# Patient Record
Sex: Female | Born: 1960
Health system: Southern US, Community
[De-identification: ages and names within clinical notes are randomized; demographics above are authoritative.]

## PROBLEM LIST (undated history)

## (undated) DIAGNOSIS — Z720 Tobacco use: Secondary | ICD-10-CM

## (undated) DIAGNOSIS — Z87440 Personal history of urinary (tract) infections: Secondary | ICD-10-CM

## (undated) DIAGNOSIS — J45909 Unspecified asthma, uncomplicated: Secondary | ICD-10-CM

## (undated) DIAGNOSIS — F319 Bipolar disorder, unspecified: Secondary | ICD-10-CM

## (undated) DIAGNOSIS — R892 Abnormal level of other drugs, medicaments and biological substances in specimens from other organs, systems and tissues: Secondary | ICD-10-CM

## (undated) DIAGNOSIS — R079 Chest pain, unspecified: Secondary | ICD-10-CM

## (undated) DIAGNOSIS — T7491XA Unspecified adult maltreatment, confirmed, initial encounter: Secondary | ICD-10-CM

## (undated) DIAGNOSIS — F1010 Alcohol abuse, uncomplicated: Secondary | ICD-10-CM

## (undated) DIAGNOSIS — J449 Chronic obstructive pulmonary disease, unspecified: Secondary | ICD-10-CM

## (undated) DIAGNOSIS — J329 Chronic sinusitis, unspecified: Secondary | ICD-10-CM

## (undated) DIAGNOSIS — F1910 Other psychoactive substance abuse, uncomplicated: Secondary | ICD-10-CM

## (undated) DIAGNOSIS — F329 Major depressive disorder, single episode, unspecified: Secondary | ICD-10-CM

## (undated) DIAGNOSIS — M0600 Rheumatoid arthritis without rheumatoid factor, unspecified site: Secondary | ICD-10-CM

## (undated) DIAGNOSIS — F411 Generalized anxiety disorder: Secondary | ICD-10-CM

## (undated) DIAGNOSIS — M25559 Pain in unspecified hip: Secondary | ICD-10-CM

## (undated) DIAGNOSIS — R609 Edema, unspecified: Secondary | ICD-10-CM

## (undated) DIAGNOSIS — M6282 Rhabdomyolysis: Secondary | ICD-10-CM

## (undated) DIAGNOSIS — F32A Depression, unspecified: Secondary | ICD-10-CM

## (undated) DIAGNOSIS — H6690 Otitis media, unspecified, unspecified ear: Secondary | ICD-10-CM

## (undated) DIAGNOSIS — F1410 Cocaine abuse, uncomplicated: Secondary | ICD-10-CM

## (undated) DIAGNOSIS — F1510 Other stimulant abuse, uncomplicated: Secondary | ICD-10-CM

## (undated) DIAGNOSIS — J309 Allergic rhinitis, unspecified: Secondary | ICD-10-CM

## (undated) DIAGNOSIS — F17200 Nicotine dependence, unspecified, uncomplicated: Secondary | ICD-10-CM

## (undated) DIAGNOSIS — F419 Anxiety disorder, unspecified: Secondary | ICD-10-CM

## (undated) DIAGNOSIS — Z9289 Personal history of other medical treatment: Secondary | ICD-10-CM

## (undated) DIAGNOSIS — K219 Gastro-esophageal reflux disease without esophagitis: Secondary | ICD-10-CM

## (undated) DIAGNOSIS — K589 Irritable bowel syndrome without diarrhea: Secondary | ICD-10-CM

## (undated) DIAGNOSIS — I480 Paroxysmal atrial fibrillation: Secondary | ICD-10-CM

## (undated) DIAGNOSIS — F22 Delusional disorders: Secondary | ICD-10-CM

## (undated) DIAGNOSIS — E785 Hyperlipidemia, unspecified: Secondary | ICD-10-CM

## (undated) DIAGNOSIS — F341 Dysthymic disorder: Secondary | ICD-10-CM

## (undated) DIAGNOSIS — F29 Unspecified psychosis not due to a substance or known physiological condition: Secondary | ICD-10-CM

## (undated) DIAGNOSIS — M359 Systemic involvement of connective tissue, unspecified: Secondary | ICD-10-CM

## (undated) DIAGNOSIS — J40 Bronchitis, not specified as acute or chronic: Secondary | ICD-10-CM

## (undated) DIAGNOSIS — M797 Fibromyalgia: Secondary | ICD-10-CM

## (undated) DIAGNOSIS — N390 Urinary tract infection, site not specified: Secondary | ICD-10-CM

## (undated) HISTORY — DX: Allergic rhinitis, unspecified: J30.9

## (undated) HISTORY — DX: Chronic obstructive pulmonary disease, unspecified: J44.9

## (undated) HISTORY — DX: Gastro-esophageal reflux disease without esophagitis: K21.9

## (undated) HISTORY — DX: Major depressive disorder, single episode, unspecified: F32.9

## (undated) HISTORY — PX: TUBAL LIGATION: SHX77

## (undated) HISTORY — DX: Depression, unspecified: F32.A

## (undated) HISTORY — DX: Bronchitis, not specified as acute or chronic: J40

## (undated) HISTORY — DX: Otitis media, unspecified, unspecified ear: H66.90

## (undated) HISTORY — DX: Urinary tract infection, site not specified: N39.0

## (undated) HISTORY — PX: TONSILLECTOMY: SHX5217

## (undated) HISTORY — DX: Nicotine dependence, unspecified, uncomplicated: F17.200

## (undated) HISTORY — DX: Unspecified asthma, uncomplicated: J45.909

## (undated) HISTORY — DX: Dysthymic disorder: F34.1

## (undated) HISTORY — DX: Chronic sinusitis, unspecified: J32.9

## (undated) HISTORY — DX: Rheumatoid arthritis without rheumatoid factor, unspecified site: M06.00

## (undated) HISTORY — PX: MOUTH SURGERY: SHX715

## (undated) HISTORY — DX: Unspecified adult maltreatment, confirmed, initial encounter: T74.91XA

## (undated) HISTORY — PX: FOOT SURGERY: SHX648

## (undated) HISTORY — DX: Irritable bowel syndrome without diarrhea: K58.9

## (undated) HISTORY — DX: Edema, unspecified: R60.9

## (undated) HISTORY — DX: Rhabdomyolysis: M62.82

## (undated) HISTORY — DX: Personal history of urinary (tract) infections: Z87.440

## (undated) HISTORY — DX: Hyperlipidemia, unspecified: E78.5

## (undated) HISTORY — DX: Pain in unspecified hip: M25.559

## (undated) HISTORY — DX: Abnormal level of other drugs, medicaments and biological substances in specimens from other organs, systems and tissues: R89.2

---
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
## 1998-07-13 HISTORY — PX: ABDOMINAL HYSTERECTOMY: SHX81

## 2003-11-28 LAB — HM MAMMOGRAPHY: HM Mammogram: NORMAL

## 2004-07-30 ENCOUNTER — Inpatient Hospital Stay: Payer: Self-pay

## 2004-08-20 ENCOUNTER — Ambulatory Visit: Payer: Self-pay | Admitting: Internal Medicine

## 2004-11-12 ENCOUNTER — Ambulatory Visit: Payer: Self-pay | Admitting: Internal Medicine

## 2005-06-17 ENCOUNTER — Ambulatory Visit: Payer: Self-pay | Admitting: Internal Medicine

## 2005-08-01 ENCOUNTER — Emergency Department: Payer: Self-pay | Admitting: Unknown Physician Specialty

## 2005-08-03 ENCOUNTER — Emergency Department: Payer: Self-pay | Admitting: Emergency Medicine

## 2005-11-04 ENCOUNTER — Ambulatory Visit: Payer: Self-pay | Admitting: Internal Medicine

## 2006-08-27 ENCOUNTER — Emergency Department: Payer: Self-pay | Admitting: Unknown Physician Specialty

## 2006-08-30 ENCOUNTER — Emergency Department: Payer: Self-pay | Admitting: Unknown Physician Specialty

## 2006-11-14 ENCOUNTER — Emergency Department: Payer: Self-pay | Admitting: Emergency Medicine

## 2008-03-26 ENCOUNTER — Ambulatory Visit: Payer: Self-pay | Admitting: Internal Medicine

## 2008-03-26 DIAGNOSIS — M0600 Rheumatoid arthritis without rheumatoid factor, unspecified site: Secondary | ICD-10-CM

## 2008-03-26 DIAGNOSIS — K581 Irritable bowel syndrome with constipation: Secondary | ICD-10-CM

## 2008-03-26 DIAGNOSIS — R609 Edema, unspecified: Secondary | ICD-10-CM

## 2008-03-26 DIAGNOSIS — J329 Chronic sinusitis, unspecified: Secondary | ICD-10-CM

## 2008-03-26 DIAGNOSIS — H6690 Otitis media, unspecified, unspecified ear: Secondary | ICD-10-CM

## 2008-03-26 DIAGNOSIS — F341 Dysthymic disorder: Secondary | ICD-10-CM

## 2008-03-26 DIAGNOSIS — N3020 Other chronic cystitis without hematuria: Secondary | ICD-10-CM

## 2008-03-26 DIAGNOSIS — N390 Urinary tract infection, site not specified: Secondary | ICD-10-CM

## 2008-03-26 DIAGNOSIS — H6692 Otitis media, unspecified, left ear: Secondary | ICD-10-CM

## 2008-03-26 DIAGNOSIS — J40 Bronchitis, not specified as acute or chronic: Secondary | ICD-10-CM

## 2008-03-26 DIAGNOSIS — F411 Generalized anxiety disorder: Secondary | ICD-10-CM

## 2008-03-26 DIAGNOSIS — K589 Irritable bowel syndrome without diarrhea: Secondary | ICD-10-CM

## 2008-03-26 HISTORY — DX: Irritable bowel syndrome, unspecified: K58.9

## 2008-03-26 HISTORY — DX: Urinary tract infection, site not specified: N39.0

## 2008-03-26 HISTORY — DX: Chronic sinusitis, unspecified: J32.9

## 2008-03-26 HISTORY — DX: Otitis media, unspecified, unspecified ear: H66.90

## 2008-03-26 HISTORY — DX: Dysthymic disorder: F34.1

## 2008-03-26 HISTORY — DX: Edema, unspecified: R60.9

## 2008-03-26 HISTORY — DX: Rheumatoid arthritis without rheumatoid factor, unspecified site: M06.00

## 2008-03-28 ENCOUNTER — Telehealth: Payer: Self-pay | Admitting: Internal Medicine

## 2008-05-14 ENCOUNTER — Emergency Department: Payer: Self-pay | Admitting: Unknown Physician Specialty

## 2008-06-22 ENCOUNTER — Ambulatory Visit: Payer: Self-pay | Admitting: Internal Medicine

## 2008-06-22 LAB — CONVERTED CEMR LAB: Rhuematoid fact SerPl-aCnc: <20 intl units/mL — ABNORMAL LOW (ref 0.0–20.0)

## 2008-06-29 ENCOUNTER — Emergency Department: Payer: Self-pay | Admitting: Emergency Medicine

## 2008-07-17 ENCOUNTER — Telehealth: Payer: Self-pay | Admitting: Internal Medicine

## 2008-08-22 ENCOUNTER — Telehealth: Payer: Self-pay | Admitting: Internal Medicine

## 2008-09-05 ENCOUNTER — Emergency Department: Payer: Self-pay | Admitting: Emergency Medicine

## 2008-09-21 ENCOUNTER — Ambulatory Visit: Payer: Self-pay | Admitting: Internal Medicine

## 2008-09-21 DIAGNOSIS — M25559 Pain in unspecified hip: Secondary | ICD-10-CM

## 2008-09-21 HISTORY — DX: Pain in unspecified hip: M25.559

## 2008-09-25 ENCOUNTER — Telehealth (INDEPENDENT_AMBULATORY_CARE_PROVIDER_SITE_OTHER): Payer: Self-pay | Admitting: *Deleted

## 2008-10-08 ENCOUNTER — Telehealth: Payer: Self-pay | Admitting: Internal Medicine

## 2008-12-20 ENCOUNTER — Ambulatory Visit: Payer: Self-pay | Admitting: Internal Medicine

## 2009-02-27 ENCOUNTER — Telehealth: Payer: Self-pay | Admitting: Internal Medicine

## 2009-03-01 ENCOUNTER — Telehealth: Payer: Self-pay | Admitting: Internal Medicine

## 2009-03-05 ENCOUNTER — Encounter (INDEPENDENT_AMBULATORY_CARE_PROVIDER_SITE_OTHER): Payer: Self-pay | Admitting: *Deleted

## 2009-03-22 ENCOUNTER — Ambulatory Visit: Payer: Self-pay | Admitting: Internal Medicine

## 2009-03-23 DIAGNOSIS — J309 Allergic rhinitis, unspecified: Secondary | ICD-10-CM | POA: Insufficient documentation

## 2009-03-23 HISTORY — DX: Allergic rhinitis, unspecified: J30.9

## 2009-04-04 ENCOUNTER — Encounter: Payer: Self-pay | Admitting: Internal Medicine

## 2009-05-21 ENCOUNTER — Telehealth (INDEPENDENT_AMBULATORY_CARE_PROVIDER_SITE_OTHER): Payer: Self-pay | Admitting: *Deleted

## 2009-06-02 ENCOUNTER — Emergency Department: Payer: Self-pay | Admitting: Emergency Medicine

## 2009-06-04 ENCOUNTER — Emergency Department: Payer: Self-pay | Admitting: Emergency Medicine

## 2009-06-24 ENCOUNTER — Ambulatory Visit: Payer: Self-pay | Admitting: Internal Medicine

## 2009-06-24 DIAGNOSIS — Z87891 Personal history of nicotine dependence: Secondary | ICD-10-CM

## 2009-06-24 DIAGNOSIS — F17200 Nicotine dependence, unspecified, uncomplicated: Secondary | ICD-10-CM

## 2009-06-24 HISTORY — DX: Nicotine dependence, unspecified, uncomplicated: F17.200

## 2009-08-16 ENCOUNTER — Encounter: Payer: Self-pay | Admitting: Internal Medicine

## 2009-08-26 ENCOUNTER — Telehealth: Payer: Self-pay | Admitting: Internal Medicine

## 2009-09-27 ENCOUNTER — Ambulatory Visit: Payer: Self-pay

## 2009-09-27 ENCOUNTER — Ambulatory Visit: Payer: Self-pay | Admitting: Internal Medicine

## 2009-10-08 ENCOUNTER — Telehealth: Payer: Self-pay | Admitting: Internal Medicine

## 2009-10-16 ENCOUNTER — Telehealth (INDEPENDENT_AMBULATORY_CARE_PROVIDER_SITE_OTHER): Payer: Self-pay | Admitting: *Deleted

## 2009-10-28 ENCOUNTER — Telehealth: Payer: Self-pay | Admitting: Internal Medicine

## 2009-12-23 ENCOUNTER — Ambulatory Visit: Payer: Self-pay | Admitting: Internal Medicine

## 2010-01-09 ENCOUNTER — Telehealth: Payer: Self-pay | Admitting: Internal Medicine

## 2010-02-25 ENCOUNTER — Telehealth: Payer: Self-pay | Admitting: Internal Medicine

## 2010-03-06 ENCOUNTER — Telehealth: Payer: Self-pay | Admitting: Internal Medicine

## 2010-03-28 ENCOUNTER — Telehealth: Payer: Self-pay | Admitting: Internal Medicine

## 2010-04-22 ENCOUNTER — Ambulatory Visit: Payer: Self-pay | Admitting: Internal Medicine

## 2010-04-28 ENCOUNTER — Telehealth: Payer: Self-pay | Admitting: Internal Medicine

## 2010-05-12 ENCOUNTER — Emergency Department: Payer: Self-pay | Admitting: Unknown Physician Specialty

## 2010-07-23 ENCOUNTER — Ambulatory Visit: Admit: 2010-07-23 | Payer: Self-pay | Admitting: Internal Medicine
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
## 2010-08-13 NOTE — Progress Notes (Signed)
Summary: REQ FOR RF  Phone Note Other Incoming Call back at [facility] Phone [phone]   Caller: pt Details of Request: Pharm Phone  [phone] Fax [phone] Summary of Call: Pt called and states that she needs a refill on Xifaxan for her diarrhea and a refill on prednisone for her back. She uses [facility] in [place] fear. Please Advise Initial call taken by: Ami Bullins CMA,  January 09, 2010 11:31 AM  Follow-up for Phone Call        1. OK for xifaxan 200mg  three times a day x 7 days for IBS flare 2.OOK for prednisone 10mg : 3 tabs once daily x 3, 2 tabs once daily x 3, 1 tab once daily x 6 Follow-up by: Jacques Navy MD,  January 09, 2010 5:26 PM  Additional Follow-up for Phone Call Additional follow up Details #1::        Called into pharmacy, Pt informed  Additional Follow-up by: Lamar Sprinkles, CMA,  January 10, 2010 3:07 PM    New/Updated Medications: XIFAXAN 200 MG TABS (RIFAXIMIN) 1 three times a day x 7 days for IBS flare PREDNISONE 10 MG TABS (PREDNISONE) 3 tabs once daily x 3, 2 tabs once daily x 3, 1 tab once daily x 6 Prescriptions: PREDNISONE 10 MG TABS (PREDNISONE) 3 tabs once daily x 3, 2 tabs once daily x 3, 1 tab once daily x 6  #21 x 0   Entered by:   Lamar Sprinkles, CMA   Authorized by:   Jacques Navy MD   Signed by:   Lamar Sprinkles, CMA on 01/10/2010   Method used:   Historical   RxID:   [id number] XIFAXAN 200 MG TABS (RIFAXIMIN) 1 three times a day x 7 days for IBS flare  #21 x 0   Entered by:   Lamar Sprinkles, CMA   Authorized by:   Jacques Navy MD   Signed by:   Lamar Sprinkles, CMA on 01/10/2010   Method used:   Historical   RxID:   [id number]

## 2010-08-13 NOTE — Progress Notes (Signed)
  Phone Note Refill Request Message from:  pt  on [phone]  Refills Requested: Medication #1:  ALPRAZOLAM 1 MG TABS 1 by mouth four times [place] in the hosp and she is not going to be able to make it for an appt till Oct 11. Can pt have xanax to get her through until she comes in. She uses [phone]  Initial call taken by: Ami Bullins CMA,  March 28, 2010 4:17 PM  Follow-up for Phone Call        ok to renew xanax prescription to pharmacy Follow-up by: Jacques Navy MD,  March 31, 2010 8:45 AM  Additional Follow-up for Phone Call Additional follow up Details #1::        sent Alprazalom refill to [facility] with 1 refill Additional Follow-up by: Ami Bullins CMA,  March 31, 2010 4:51 PM

## 2010-08-13 NOTE — Progress Notes (Signed)
Summary: SAMPLES  Phone Note Call from Patient   Summary of Call: Patient is requesting samples, samples ready. Pt informed  Initial call taken by: Lamar Sprinkles, CMA,  August 26, 2009 2:12 PM

## 2010-08-13 NOTE — Progress Notes (Signed)
Summary: RX change  Phone Note From Pharmacy   Caller: [facility] Summary of Call: Rec'd fax from pharmacy asking if the office will change Macrobid to Macrodantin b/c of price. Initial call taken by: Lucious Groves,  August 26, 2009 2:41 PM  Follow-up for Phone Call        OK to change to generic  Follow-up by: Jacques Navy MD,  August 26, 2009 6:03 PM  Additional Follow-up for Phone Call Additional follow up Details #1::        Pharm informed Additional Follow-up by: Lamar Sprinkles, CMA,  August 27, 2009 8:41 AM
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
## 2010-08-13 NOTE — Progress Notes (Signed)
Summary: SAMPLES  Phone Note Call from Patient Call back at Home Phone [phone]   Summary of Call: Patient is requesting samples of cymbalta.  Initial call taken by: Lamar Sprinkles, CMA,  October 28, 2009 1:34 PM  Follow-up for Phone Call        Pt. notified/lmovm to pick up samples.Marland KitchenMarland KitchenAlvy Beal Archie CMA  October 29, 2009 2:04 PM

## 2010-08-13 NOTE — Letter (Signed)
Summary: [facility]  [facility]   Imported By: Sherian Rein 10/02/2009 09:05:13  _____________________________________________________________________  External Attachment:    Type:   Image     Comment:   External Document

## 2010-08-13 NOTE — Progress Notes (Signed)
Summary: Records request from [facility]  Request for records received from [facility]. Request forwarded to [facility]. Kaylee [name]  October 16, 2009 12:43 PM

## 2010-08-13 NOTE — Progress Notes (Signed)
Summary: rx request  Phone Note Call from Patient   Reason for Call: Refill Medication Summary of Call: Patient called the office today stating that she needs Prednisone or Amoxicillin. I made her aware of MD recommendation per last phone note and she states that she is not in [place], [place] and [facility]ins is her PCP. Per the patient she drives here q 3 months to keep Norins as PCP. I made patient aware that I will ask MD to reconsider. Please advise. Initial call taken by: Lucious Groves CMA,  March 06, 2010 1:46 PM  Follow-up for Phone Call        Sorry I got the state wrong, none the less if she is sick and thinks she needs medication [place] Cannot diagnose or treat her by telephone. Follow-up by: Jacques Navy MD,  March 06, 2010 4:43 PM  Additional Follow-up for Phone Call Additional follow up Details #1::        # not accepting calls..............Marland KitchenLamar Sprinkles, CMA  March 07, 2010 9:20 AM   Spoke w/pt, informed of above, she states that she will discuss w/MD at next office visit in september.  Additional Follow-up by: Lamar Sprinkles, CMA,  March 07, 2010 5:44 PM

## 2010-08-13 NOTE — Progress Notes (Signed)
  Phone Note Other Incoming   Caller: pt  Summary of Call: Spoke with pt to inform her that I am mailing her prescriptions to her. Pt states she has moved and wants the prescriptions mailed to [address] in [place]. She had also mentioned refill on Hydroxizine please Advise. Initial call taken by: Ami Bullins CMA,  April 28, 2010 2:17 PM  Follow-up for Phone Call        OK to call in hydroxyzine 25mg  1 by mouth q6 as needed #100 wit 2 refills. Follow-up by: Jacques Navy MD,  April 28, 2010 4:34 PM  Additional Follow-up for Phone Call Additional follow up Details #1::        Spoke with Marcelino Duster at [facility]. she states the pt has a prescription for the hydroxazine. So this will not be filled Additional Follow-up by: Ami Bullins CMA,  April 28, 2010 5:07 PM    New/Updated Medications: OXYCODONE-ACETAMINOPHEN 5-325 MG TABS (OXYCODONE-ACETAMINOPHEN) 1 by mouth qid a day fill on or after 05/30/2010 OXYCODONE-ACETAMINOPHEN 5-325 MG TABS (OXYCODONE-ACETAMINOPHEN) 1 by mouth qid a day fill on or after 06/29/2010 Prescriptions: OXYCODONE-ACETAMINOPHEN 5-325 MG TABS (OXYCODONE-ACETAMINOPHEN) 1 by mouth qid a day fill on or after 06/29/2010  #120 x 0   Entered by:   Ami Bullins CMA   Authorized by:   Jacques Navy MD   Signed by:   Bill Salinas CMA on 04/28/2010   Method used:   Print then Mail to Patient   RxID:   [id number] OXYCODONE-ACETAMINOPHEN 5-325 MG TABS (OXYCODONE-ACETAMINOPHEN) 1 by mouth qid a day fill on or after 05/30/2010  #120 x 0   Entered by:   Ami Bullins CMA   Authorized by:   Jacques Navy MD   Signed by:   Bill Salinas CMA on 04/28/2010   Method used:   Print then Mail to Patient   RxID:   [id number]

## 2010-08-13 NOTE — Miscellaneous (Signed)
Summary[facility]   Imported By: Lester Shallotte 10/04/2009 09:55:49  _____________________________________________________________________  External Attachment:    Type:   Image     Comment:   External Document

## 2010-08-13 NOTE — Assessment & Plan Note (Signed)
Summary: 3 MOS F/U / # / CD   Vital Signs:  Patient profile:   50 year old female Height:      66 inches Weight:      171 pounds BMI:     27.70 O2 Sat:      97 % on Room air Temp:     98.5 degrees F oral Pulse rate:   76 / minute BP sitting:   118 / 68  (left arm) Cuff size:   regular  Vitals Entered By: Bill Salinas CMA (December 23, 2009 11:23 AM)  O2 Flow:  Room air CC: follow-up visit/ ab   Primary Care Provider:  Norins  CC:  follow-up visit/ ab.  History of Present Illness: Patient presents for follow-up of chronic back  pain, sciatica. She continues to follow-up at [facility] and is scheduled for [facility] and full spinal evaluation June 6th. She did have a flare of pain and responded to a prednisone burst and taper.   She reports that she had recurrent ear pain and drainage from the eyes.   She has relocated to [facility] and will be changing to a local pharmacy [facility].  Current Medications (verified): 1)  Nitrofurantoin Macrocrystal 100 Mg Caps (Nitrofurantoin Macrocrystal) .[name].. 1 By Mouth Once Daily For Suppression 2)  Alprazolam 1 Mg Tabs (Alprazolam) .[name].. 1 By Mouth Four Times A Day 3)  Cymbalta 30 Mg Cpep (Duloxetine Hcl) .[name].. 1 By Mouth Qd 4)  Oxycodone-Acetaminophen 5-325 Mg Tabs (Oxycodone-Acetaminophen) .[name].. 1 By Mouth Qid A Day Fill On or After 11/27/2009 5)  Nabumetone 750 Mg Tabs (Nabumetone) .... 2 By Mouth At Bedtime For Arthritic Pain 6)  Zanaflex 4 Mg Tabs (Tizanidine Hcl) .... Take 1 Tab By Mouth At Bedtime 7)  Hydroxyzine Hcl 25 Mg Tabs (Hydroxyzine Hcl) .[name].. 1 By Mouth Q 6 As Needed Anxiety 8)  Dicyclomine Hcl 10 Mg Caps (Dicyclomine Hcl) .[name].. 1 By Mouth Q6 As Needed Ibs Symptoms. 9)  Amoxicillin 875 Mg Tabs (Amoxicillin) .[name].. 1 By Mouth Two Times A Day For Recurrent Om 10)  Fluconazole 150 Mg Tabs (Fluconazole) .[name].. 1 X 1 For Vaginitis Related To Antibiotic Use  Allergies (verified): 1)  ! Sulfa 2)  ! Morphine 3)  ! Methadone Hcl (Methadone  Hcl)  Past History:  Past Medical History: Last updated: 03/26/2008 UCD OTITIS MEDIA, CHRONIC (ICD-382.9) IRRITABLE BOWEL SYNDROME (ICD-564.1) RHEUMATOID ARTHRITIS (ICD-714.0) URINARY TRACT INFECTION, CHRONIC (ICD-599.0) ANXIETY DEPRESSION (ICD-300.4)  Past Surgical History: Last updated: 03/26/2008 oral surgery reconstructive ear surgery - left '80s Tonsillectomy left foot surgery -x 3 Tubal ligation Hysterectomy-cervical dysplasia   G2P2  NSVD  Family History: Last updated: 03/26/2008 father - '31: Alzheimer's mother -'70: health PGM - breast cancer Multiple family  with colon cancer MGM - CAD/.MI  Social History: Last updated: 03/26/2008 HSG, 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 1 daughter - '79; 1 son - '77; 7 grandchildren various odd jobs.  Risk Factors: Alcohol Use: <1 (03/26/2008) Caffeine Use: 3 (03/26/2008) Exercise: no (03/26/2008)  Risk Factors: Smoking Status: quit (03/26/2008)  Review of Systems  The patient denies anorexia, fever, weight loss, weight gain, chest pain, dyspnea on exertion, headaches, and hemoptysis.    Physical Exam  General:  alert, well-developed, and well-nourished.   Head:  normocephalic and atraumatic.   Eyes:  mild erythema of the bulbar and palpebral conjunctiva. No frank exudate. Ears:  EAC left clear.  Mouth:  no oral lesions Neck:  supple.   Lungs:  normal respiratory effort and normal breath sounds.   Heart:  normal rate and regular rhythm.   Abdomen:  soft and normal bowel sounds.   Msk:  normal ROM, no joint swelling, no joint warmth, and no redness over joints.   Pulses:  2+ Neurologic:  alert & oriented X3, cranial nerves II-XII intact, and gait normal.     Impression & Recommendations:  Problem # 1:  IRRITABLE  BOWEL SYNDROME (ICD-564.1) Persistent symptoms. discussed other options to bentyl.  Plan - continue high fiber diet           continue bentyl  Problem # 2:  HIP PAIN, BILATERAL (ICD-719.45) Continued to have pain and is beeing followed now at [facility] - provided refill pain medications 30day Rx x 3. She has informed me of a change in pharmacy since she is now living on [facility].  Her updated medication list for this problem includes:    Oxycodone-acetaminophen 5-325 Mg Tabs (Oxycodone-acetaminophen) .[name]... 1 by mouth qid a day fill on or after 02/27/2010    Nabumetone 750 Mg Tabs (Nabumetone) .[name]... 2 by mouth at bedtime for arthritic pain    Zanaflex 4 Mg Tabs (Tizanidine hcl) .[name]... Take 1 tab by mouth at bedtime  Problem # 3:  URINARY TRACT INFECTION, CHRONIC (ICD-599.0) No current symptoms  Have provided Rx for fluconazole to [place] as needed.  Her updated medication list for this problem includes:    Nitrofurantoin Macrocrystal 100 Mg Caps (Nitrofurantoin macrocrystal) .[name]... 1 by mouth once daily for suppression    Amoxicillin 875 Mg Tabs (Amoxicillin) .[name]... 1 by mouth two times a day for recurrent om  Complete Medication List: 1)  Nitrofurantoin Macrocrystal 100 Mg Caps (Nitrofurantoin macrocrystal) .[name].. 1 by mouth once daily for suppression 2)  Alprazolam 1 Mg Tabs (Alprazolam) .[name].. 1 by mouth four times a day 3)  Cymbalta 30 Mg Cpep (Duloxetine hcl) .[name].. 1 by mouth qd 4)  Oxycodone-acetaminophen 5-325 Mg Tabs (Oxycodone-acetaminophen) .[name].. 1 by mouth qid a day fill on or after 02/27/2010 5)  Nabumetone 750 Mg Tabs (Nabumetone) .... 2 by mouth at bedtime for arthritic pain 6)  Zanaflex 4 Mg Tabs (Tizanidine hcl) .... Take 1 tab by mouth at bedtime 7)  Hydroxyzine Hcl 25 Mg Tabs (Hydroxyzine hcl) .[name].. 1 by mouth q 6 as needed anxiety 8)  Dicyclomine Hcl 10 Mg Caps (Dicyclomine hcl) .[name].. 1 by mouth q6 as needed ibs symptoms. 9)  Amoxicillin 875 Mg Tabs (Amoxicillin)  .[name].. 1 by mouth two times a day for recurrent om 10)  Fluconazole 150 Mg Tabs (Fluconazole) .[name].. 1 x 1 for vaginitis related to antibiotic use 11)  Gentamicin Sulfate 0.3 % Soln (Gentamicin sulfate) .... 2 qtts os qid x 5 days for conjunctivitis 12)  Fluconazole 150 Mg Tabs (Fluconazole) .[name].. 1 by mouth x 1 for yeast vaginitis Prescriptions: FLUCONAZOLE 150 MG TABS (FLUCONAZOLE) 1 by mouth x 1 for yeast vaginitis  #1 x 4   Entered and Authorized by:   Jacques Navy MD   Signed by:   Jacques Navy MD on 12/23/2009   Method used:   Print then Give to Patient   RxID:   [id number] GENTAMICIN SULFATE 0.3 % SOLN (GENTAMICIN SULFATE) 2 qtts OS qid x 5 days for conjunctivitis  #10 cc x 1  Entered and Authorized by:   Jacques Navy MD   Signed by:   Jacques Navy MD on 12/23/2009   Method used:   Print then Give to Patient   RxID:   [id number] OXYCODONE-ACETAMINOPHEN 5-325 MG TABS (OXYCODONE-ACETAMINOPHEN) 1 by mouth qid a day fill on or after 02/27/2010  #120 x 0   Entered by:   Ami Bullins CMA   Authorized by:   Jacques Navy MD   Signed by:   Bill Salinas CMA on 12/23/2009   Method used:   Print then Give to Patient   RxID:   [id number] OXYCODONE-ACETAMINOPHEN 5-325 MG TABS (OXYCODONE-ACETAMINOPHEN) 1 by mouth qid a day fill on or after 01/27/2010  #120 x 0   Entered by:   Ami Bullins CMA   Authorized by:   Jacques Navy MD   Signed by:   Bill Salinas CMA on 12/23/2009   Method used:   Print then Give to Patient   RxID:   [id number] OXYCODONE-ACETAMINOPHEN 5-325 MG TABS (OXYCODONE-ACETAMINOPHEN) 1 by mouth qid a day fill on or after 12/28/2009  #120 x 0   Entered by:   Ami Bullins CMA   Authorized by:   Jacques Navy MD   Signed by:   Bill Salinas CMA on 12/23/2009   Method used:   Print then Give to Patient   RxID:   [id number]

## 2010-08-13 NOTE — Progress Notes (Signed)
Summary: REQ FOR RX  Phone Note Call from Patient Call back at Home Phone [phone]   Initial call taken by: Lamar Sprinkles, CMA,  February 25, 2010 2:45 PM Summary of Call: Patient is requesting rx to go to [facility] [phone]. She would like antibiotics for ear infection, ammoxicillin and ear drops.  Initial call taken by: Lamar Sprinkles, CMA,  February 25, 2010 2:46 PM  Follow-up for Phone Call        Cannot prescribe treatment long distance. Recommend Urgent Care visit in [place] Follow-up by: Jacques Navy MD,  February 26, 2010 4:56 AM  Additional Follow-up for Phone Call Additional follow up Details #1::        Phone is not accepting calls at this time, will try again later..............Marland KitchenLamar Sprinkles, CMA  February 26, 2010 10:55 AM   # not accepting calls................Marland KitchenLamar Sprinkles, CMA  February 26, 2010 5:35 PM     Additional Follow-up for Phone Call Additional follow up Details #2::    # is not accepting call at this time. Closing phone note until further contact from pt. Follow-up by: Margaret Pyle, CMA,  February 27, 2010 8:28 AM

## 2010-08-13 NOTE — Progress Notes (Signed)
Summary: REFILL  Phone Note Call from Patient   Summary of Call: Patient is requesting refill of [facility] [phone], she forgot her medication at home. Initial call taken by: Lamar Sprinkles, CMA,  October 08, 2009 8:58 AM  Follow-up for Phone Call        Left vm for pt to check w/pharm, [facility] Follow-up by: Lamar Sprinkles, CMA,  October 08, 2009 5:02 PM    Prescriptions: NITROFURANTOIN MACROCRYSTAL 100 MG CAPS (NITROFURANTOIN MACROCRYSTAL) 1 by mouth once daily for suppression  #30 x 0   Entered by:   Lamar Sprinkles, CMA   Authorized by:   Tresa Garter MD   Signed by:   Lamar Sprinkles, CMA on 10/08/2009   Method used:   Historical   RxID:   [id number]

## 2010-08-22 ENCOUNTER — Telehealth (INDEPENDENT_AMBULATORY_CARE_PROVIDER_SITE_OTHER): Payer: Self-pay | Admitting: *Deleted

## 2010-08-28 ENCOUNTER — Telehealth: Payer: Self-pay | Admitting: Internal Medicine

## 2010-09-09 NOTE — Progress Notes (Signed)
  Phone Note Call from Patient   Caller: [facility] Reason for Call: Privacy/Consent Authorization Details for Reason: Questioning if records had been sent. Summary of Call: Patient initially called 08/21/10 and spoke with [facility].  She was inquiring about the records that were requested on 08/13/10.  Dena reported to me that Ms. Kaylee [name] was yelling at her and was asking why we didn't fax the records instead of mailing them.  Dena explained that the records could not be faxed to an unsecure fax machine; as the requestor was a shelter and not a medical facility.  Dena reported to me that [name] didn't seem to understand and kept yelling at her.  Dena said Ms. Kaylee [name] would like to speak to a Production designer, theatre/television/film.     Follow-up for Phone Call        Ms. Kaylee [name] explained that she was not trying to be rude and apologized for coming across that way with Dena.  She explained that she had movers in and out and that she couldn't hear Dena very well; therefore she assumed Dena couldn't hear her.    Ms. Kaylee [name] asked about the records and I confirmed what Dena had initially explained to her.  She said she understood.  She also wanted to know if we had received a faxed request for records 2 nights prior to this, from [facility] in [place].    I explained it was after 5:00 p.m. and the computers had been taken down where I needed to check on this.  I assured her I would call her the next morning to let her know if we had received it.    Additional Follow-up for Phone Call Additional follow up Details #1::        I checked the system and we have not received any other request for records on Ms. Kaylee [name].    I have called and left her a voice message to return my call.     Additional Follow-up for Phone Call Additional follow up Details #2::    [name] has not returned my call.

## 2010-09-09 NOTE — Progress Notes (Signed)
Summary: assistance  Phone Note Call from Patient Call back at [phone]   Caller: Patient Summary of Call: Patient called [facility] stating that [place] now [place] out [place] and current MD and hospital will not refill her xanax, kidney med, and inflammatory meds. She states that she is tryna find a place to live and get herself together. She feels that these meds should not be D/C, especially her xanax. She would like to know if MD can help her find another MD in the [place] or [place] area. Initial call taken by: Rock Nephew CMA,  August 28, 2010 9:04 AM  Follow-up for Phone Call        Cannot help, don't know any doctors in [place]. While finding a doctor she may need to go to urgent care Follow-up by: Jacques Navy MD,  August 28, 2010 1:23 PM  Additional Follow-up for Phone Call Additional follow up Details #1::        left vm for pt that pt had no further advisement for her concerns.  Additional Follow-up by: Lamar Sprinkles, CMA,  September 01, 2010 6:13 PM

## 2011-07-02 ENCOUNTER — Encounter: Payer: Self-pay | Admitting: Internal Medicine

## 2011-07-08 ENCOUNTER — Ambulatory Visit (INDEPENDENT_AMBULATORY_CARE_PROVIDER_SITE_OTHER): Payer: Medicare Other | Admitting: Internal Medicine

## 2011-07-08 DIAGNOSIS — M25559 Pain in unspecified hip: Secondary | ICD-10-CM

## 2011-07-08 DIAGNOSIS — K589 Irritable bowel syndrome without diarrhea: Secondary | ICD-10-CM

## 2011-07-08 DIAGNOSIS — J40 Bronchitis, not specified as acute or chronic: Secondary | ICD-10-CM

## 2011-07-08 DIAGNOSIS — N390 Urinary tract infection, site not specified: Secondary | ICD-10-CM

## 2011-07-08 DIAGNOSIS — M069 Rheumatoid arthritis, unspecified: Secondary | ICD-10-CM

## 2011-07-08 DIAGNOSIS — H6690 Otitis media, unspecified, unspecified ear: Secondary | ICD-10-CM

## 2011-07-08 DIAGNOSIS — F341 Dysthymic disorder: Secondary | ICD-10-CM

## 2011-07-08 MED ORDER — NITROFURANTOIN MONOHYD MACRO 100 MG PO CAPS: 100.0000 mg | ORAL_CAPSULE | Freq: Every day | ORAL | Status: DC

## 2011-07-08 MED ORDER — TIZANIDINE HCL 4 MG PO TABS: 4.0000 mg | ORAL_TABLET | Freq: Every day | ORAL | Status: DC

## 2011-07-08 MED ORDER — DICYCLOMINE HCL 10 MG PO CAPS: 10.0000 mg | ORAL_CAPSULE | Freq: Four times a day (QID) | ORAL | Status: DC | PRN

## 2011-07-08 MED ORDER — MELOXICAM 15 MG PO TABS: 15.0000 mg | ORAL_TABLET | Freq: Every day | ORAL | Status: AC

## 2011-07-08 MED ORDER — FLUCONAZOLE 150 MG PO TABS: ORAL_TABLET | ORAL | Status: DC

## 2011-07-08 MED ORDER — DIAZEPAM 10 MG PO TABS: 10.0000 mg | ORAL_TABLET | Freq: Two times a day (BID) | ORAL | Status: AC | PRN

## 2011-07-08 MED ORDER — OXYCODONE-ACETAMINOPHEN 5-325 MG PO TABS: 1.0000 | ORAL_TABLET | Freq: Four times a day (QID) | ORAL | Status: DC | PRN

## 2011-07-08 MED ORDER — NEOMYCIN-POLYMYXIN-HC 3.5-10000-1 OT SOLN: 3.0000 [drp] | Freq: Four times a day (QID) | OTIC | Status: AC

## 2011-07-08 MED ORDER — HYDROXYZINE HCL 25 MG PO TABS: 25.0000 mg | ORAL_TABLET | Freq: Four times a day (QID) | ORAL | Status: DC | PRN

## 2011-07-08 MED ORDER — AMOXICILLIN-POT CLAVULANATE 875-125 MG PO TABS: 1.0000 | ORAL_TABLET | Freq: Two times a day (BID) | ORAL | Status: AC

## 2011-07-08 NOTE — Progress Notes (Signed)
Subjective:    Patient ID: Kaylee [name], female    DOB: 12-30-1960, 50 y.o.   MRN: [id number]  HPI Ms. Martie Round [facility]) has been in [place]. She reports that while there she was taken off her pain and psych meds. She was arrested and in jail x 3. She hospitalized for self-harm x 1-intentional drug overdose. She reports that she has had multiple pain related procedures - injections, Epidurals (?). She is very worried about her emotional state: she will have severe body shakes, will have emotional blackouts along with violent behavior. She had been on xanax 1 mg tid-qid for many years. She was switched to diazepam 10 mg bid which she said did work for her in terms of anxiety. She reports that she is intolerant of cymbalta with flushing and agitation.   She has a history of recurrent UTI and has been on suppressive antibiotics for many years - nitrofurantoin 100 mg daily.  She has a history of DJD and chronic pain. She has been seen at [facility] in the past for this problem. She has been treated with NSAIDs and narcotics for this over a long period of time. In the past she did have a pain contract.   She has a history of IBS - she did OK with bentyl. Xifaxin was prescribed but was prohibitively expensive.   She presents with a history of fever for three days, not sleeping, cough- productive of sputum, SOB. She has sick contacts at home.   Past Medical History  Diagnosis Date  . ALLERGIC RHINITIS CAUSE UNSPECIFIED 03/23/2009  . ANXIETY DEPRESSION 03/26/2008  . BRONCHITIS 03/26/2008  . HIP PAIN, BILATERAL 09/21/2008  . Irritable bowel syndrome 03/26/2008  . OTITIS MEDIA, CHRONIC 03/26/2008  . PERIPHERAL EDEMA 03/26/2008  . Rheumatoid arthritis 03/26/2008  . TOBACCO ABUSE 06/24/2009  . URINARY TRACT INFECTION, CHRONIC 03/26/2008  . Rheumatoid arthritis    Past Surgical History  Procedure Date  . Mouth surgery   . Reconstructibve ear surgery 1980    Left  . Tonsillectomy   . Left foot  surgery     x's 3  . Tubal ligation   . Abdominal hysterectomy     cervical dysplasia   Family History  Problem Relation Age of Onset  . Healthy Mother   . Alzheimer's disease Father   . Coronary artery disease Maternal Grandmother     MI  . Breast cancer Paternal Grandmother   . Colon cancer Paternal Grandmother    History   Social History  . Marital Status: Single    Spouse Name: N/A    Number of Children: N/A  . Years of Education: N/A   Occupational History  . Not on file.   Social History Main Topics  . Smoking status: Never Smoker   . Smokeless tobacco: Not on file  . Alcohol Use: Not on file  . Drug Use: No  . Sexually Active:    Other Topics Concern  . 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Married 74-6 years, divorced; married 1981- 1 year, divorced; Married 1983- 5 years, divorced; Married 1989- 6 years, divorced; Married 1995- 1 year, divorced; Married 1997-1 year, divorced; Married 2007-Seperated1 daughter- 66; 1 son- 50; 7 grandchildrenVarious of jobs       Review of Systems System review is negative for any constitutional, cardiac, pulmonary, GI or neuro symptoms or complaints other than as described in the HPI.     Objective:   Physical Exam  Vitals reveiwed - BP mildly elevated, BMI in obesity I category Gen'l - WNWD white woman who appears very anxious HEENT- C&S clear, PERRLA Neck- supple Pulm- normal respirations w/o increased WOB Cor- RRR, mild tachycardia Neuro- A&O x 3, speech a little pressured, CN II-XII grossly normal, gait normal       Assessment & Plan:

## 2011-07-08 NOTE — Patient Instructions (Signed)
Anxiety and depression - sorry you have had so much trouble. Will resume valium (diazepam) 10 mg twice a day. You need to look into available mental health services, i.e. [place] [facility]. If you really get to a point where you feel suicidal or homicidal you need to go to [facility] for emergency evaluation.   IBS- will restart bentyl to take as needed.  Pain control - for the chronic pain due to arthritis will resume percocet 5/325 up to 4 times a day. You will need to abide by a pain contract: single prescriber, single pharmacy, no early refills, etc. Rx for percocet provided along with meloxicam 15 mg once a day - anti-inflammatory drug. You will need to call for renewal on the percocet which is written for one month. For associated muscle spasms the Zanaflex is renewed.  Once your insurance is established we will refer you to a pain management center for assistance in managing your chronic pain.   For chronic UTI and incontinence - will resume nitrofurantoin, macrobid, once a day.   For URI - will treat with augmentin twice a day for 1 week. See handout below. For cough use robitussin DM or the generic equivalent as needed. The oxycodone for pain should replace the use of codeine containing cough syrup.     Upper Respiratory Infection, Adult An upper respiratory infection (URI) is also sometimes known as the common cold. The upper respiratory tract includes the nose, sinuses, throat, trachea, and bronchi. Bronchi are the airways leading to the lungs. Most people improve within 1 week, but symptoms can last up to 2 weeks. A residual cough may last even longer.   CAUSES Many different viruses can infect the tissues lining the upper respiratory tract. The tissues become irritated and inflamed and often become very moist. Mucus production is also common. A cold is contagious. You can easily spread the virus to others by oral contact. This includes kissing, sharing a glass,  coughing, or sneezing. Touching your mouth or nose and then touching a surface, which is then touched by another person, can also spread the virus. SYMPTOMS   Symptoms typically develop 1 to 3 days after you come in contact with a cold virus. Symptoms vary from person to person. They may include:  Runny nose.     Sneezing.    Nasal congestion.     Sinus irritation.     Sore throat.     Loss of voice (laryngitis).     Cough.    Fatigue.    Muscle aches.     Loss of appetite.     Headache.    Low-grade fever.  DIAGNOSIS   You might diagnose your own cold based on familiar symptoms, since most people get a cold 2 to 3 times a year. Your caregiver can confirm this based on your exam. Most importantly, your caregiver can check that your symptoms are not due to another disease such as strep throat, sinusitis, pneumonia, asthma, or epiglottitis. Blood tests, throat tests, and X-rays are not necessary to diagnose a common cold, but they may sometimes be helpful in excluding other more serious diseases. Your caregiver will decide if any further tests are required. RISKS AND COMPLICATIONS   You may be at risk for a more severe case of the common cold if you smoke cigarettes, have chronic heart disease (such as heart failure) or lung disease (such as asthma), or if you have a weakened immune system. The very young and  very old are also at risk for more serious infections. Bacterial sinusitis, middle ear infections, and bacterial pneumonia can complicate the common cold. The common cold can worsen asthma and chronic obstructive pulmonary disease (COPD). Sometimes, these complications can require emergency medical care and may be life-threatening. PREVENTION   The best way to protect against getting a cold is to practice good hygiene. Avoid oral or hand contact with people with cold symptoms. Wash your hands often if contact occurs. There is no clear evidence that vitamin C, vitamin E, echinacea,  or exercise reduces the chance of developing a cold. However, it is always recommended to get plenty of rest and practice good nutrition. TREATMENT   Treatment is directed at relieving symptoms. There is no cure. Antibiotics are not effective, because the infection is caused by a virus, not by bacteria. Treatment may include:  Increased fluid intake. Sports drinks offer valuable electrolytes, sugars, and fluids.     Breathing heated mist or steam (vaporizer or shower).     Eating chicken soup or other clear broths, and maintaining good nutrition.     Getting plenty of rest.     Using gargles or lozenges for comfort.     Controlling fevers with ibuprofen or acetaminophen as directed by your caregiver.     Increasing usage of your inhaler if you have asthma.  Zinc gel and zinc lozenges, taken in the first 24 hours of the common cold, can shorten the duration and lessen the severity of symptoms. Pain medicines may help with fever, muscle aches, and throat pain. A variety of non-prescription medicines are available to treat congestion and runny nose. Your caregiver can make recommendations and may suggest nasal or lung inhalers for other symptoms.   HOME CARE INSTRUCTIONS    Only take over-the-counter or prescription medicines for pain, discomfort, or fever as directed by your caregiver.     Use a warm mist humidifier or inhale steam from a shower to increase air moisture. This may keep secretions moist and make it easier to breathe.     Drink enough water and fluids to keep your urine clear or pale yellow.     Rest as needed.     Return to work when your temperature has returned to normal or as your caregiver advises. You may need to stay home longer to avoid infecting others. You can also use a face mask and careful hand washing to prevent spread of the virus.  SEEK MEDICAL CARE IF:    After the first few days, you feel you are getting worse rather than better.     You need your  caregiver's advice about medicines to control symptoms.     You develop chills, worsening shortness of breath, or brown or red sputum. These may be signs of pneumonia.     You develop yellow or brown nasal discharge or pain in the face, especially when you bend forward. These may be signs of sinusitis.     You develop a fever, swollen neck glands, pain with swallowing, or white areas in the back of your throat. These may be signs of strep throat.  SEEK IMMEDIATE MEDICAL CARE IF:    You have a fever.     You develop severe or persistent headache, ear pain, sinus pain, or chest pain.     You develop wheezing, a prolonged cough, cough up blood, or have a change in your usual mucus (if you have chronic lung disease).     You  develop sore muscles or a stiff neck.  Document Released: 12/23/2000 Document Revised: 03/11/2011 Document Reviewed: 10/31/2010 [facility] Patient Information 2012 [place], [place].

## 2011-07-09 NOTE — Assessment & Plan Note (Signed)
Continued problem and source of pain. See entry below.

## 2011-07-09 NOTE — Assessment & Plan Note (Signed)
Continued problem.  Plan- renewed Rx for Bentyl

## 2011-07-09 NOTE — Assessment & Plan Note (Signed)
Patient with recurrent URI/Bronchits.  Plan - augmentin 875 mg bid x 10 days.           Supportive care.

## 2011-07-09 NOTE — Assessment & Plan Note (Signed)
Long standing problem managed with suppressive doses of nitrofurantoin. She is intolerant of quinolones and sulfonamides.  Plan - renewed Rx for nitrofurantoin 100 mg daily

## 2011-07-09 NOTE — Assessment & Plan Note (Signed)
Old chart requested: by recollection she has been sero-negative for RA. Consultation in the past with rheumatology clinic at [facility]. She also has SI joint involvement. While in [place] she reports what sounds like epidural injection therapy which by her report failed. She has a long history of pain control using oxycodone.  Plan - referral to pain management practice once her medicaid/medicare is reestablished           Rx for Roxicet for pain control - 1 month supply.           Queried [employer] - no recent Rx (last year) under both her married and maiden name           Controlled substance contract executed           She may call in for refill - if all goes well will issue 30 days supply x 3 scripts.

## 2011-07-09 NOTE — Assessment & Plan Note (Signed)
Long standing problem with a history of xanax 1 mg qid over many years and in a reliable fashion. There are gaps in care due to financial issues as well as multiple relocations. Mental health services are difficult to establish in the public sector and she does not have financial resources for  Private psychiatric care. She reports severe psychologic issues while living in [place] with police charges for assault, admission for suicide attempt. Although very anxious at this visit she denies suicidal ideation or intent. She has been on cymbalta for several years when she could afford it (past records reviewed) but now reports an intolerance with erythroderma and mild angioedema.  Plan- she is adamantly encouraged to establish with local mental health services in her county of residence - currently [place]. She is making application for medicaid and renewing her disability related Medicare.          Renewed a prescription for diazepam - started in [place]. She claims the bid diazepam works better the qid alprazolam.          Controlled substance contract executed

## 2011-07-09 NOTE — Assessment & Plan Note (Signed)
She reports this continues to be a problem.  Plan - renewed Rx for neosporin otic suspension (generic)

## 2011-08-28 ENCOUNTER — Telehealth: Payer: Self-pay | Admitting: Internal Medicine

## 2011-08-28 NOTE — Telephone Encounter (Signed)
PT IS REQUESTING REFILLS ON PERCOCET AND VALIUM.  HER PHARMACY IS [facility] [phone]).

## 2011-08-28 NOTE — Telephone Encounter (Signed)
OK for refills: may have percocet for 30 days with 3 scripts, valium with 2 refills.

## 2011-09-01 MED ORDER — OXYCODONE-ACETAMINOPHEN 5-325 MG PO TABS: 1.0000 | ORAL_TABLET | Freq: Four times a day (QID) | ORAL | Status: DC | PRN

## 2011-09-01 NOTE — Telephone Encounter (Signed)
Rx ready for p/u.

## 2011-09-01 NOTE — Telephone Encounter (Signed)
Pharmacy verified that Pt has refills on file for Valium [5], will fill. [last filled 12.26.12 #60x5] Percocet Rx Done;awaiting MD signature.

## 2011-09-22 ENCOUNTER — Other Ambulatory Visit: Payer: Self-pay | Admitting: *Deleted

## 2011-09-22 MED ORDER — NITROFURANTOIN MONOHYD MACRO 100 MG PO CAPS: 100.0000 mg | ORAL_CAPSULE | Freq: Every day | ORAL | Status: DC

## 2011-11-10 ENCOUNTER — Other Ambulatory Visit: Payer: Self-pay | Admitting: Internal Medicine

## 2011-11-10 MED ORDER — PREDNISONE 10 MG PO TABS: 10.0000 mg | ORAL_TABLET | Freq: Every day | ORAL | Status: DC

## 2011-11-10 NOTE — Telephone Encounter (Signed)
Prednisone burst and taper [facility]

## 2011-11-10 NOTE — Telephone Encounter (Signed)
Pt requesting prednisone-knee pain--[facility]--pt ph#  [phone]--pharm: [phone]

## 2011-11-11 MED ORDER — PREDNISONE 10 MG PO TABS: 10.0000 mg | ORAL_TABLET | Freq: Every day | ORAL | Status: DC

## 2011-11-11 NOTE — Telephone Encounter (Signed)
Rx sent as requested, pt informed

## 2011-11-25 ENCOUNTER — Ambulatory Visit: Payer: Self-pay | Admitting: Internal Medicine

## 2011-12-08 ENCOUNTER — Encounter: Payer: Self-pay | Admitting: Internal Medicine

## 2011-12-08 ENCOUNTER — Ambulatory Visit (INDEPENDENT_AMBULATORY_CARE_PROVIDER_SITE_OTHER): Payer: Medicare Other | Admitting: Internal Medicine

## 2011-12-08 VITALS — BP 110/80 | HR 80 | Temp 98.6°F | Resp 16 | Wt 172.0 lb

## 2011-12-08 DIAGNOSIS — M069 Rheumatoid arthritis, unspecified: Secondary | ICD-10-CM

## 2011-12-08 DIAGNOSIS — F341 Dysthymic disorder: Secondary | ICD-10-CM

## 2011-12-08 DIAGNOSIS — M25559 Pain in unspecified hip: Secondary | ICD-10-CM

## 2011-12-08 MED ORDER — OXYCODONE-ACETAMINOPHEN 5-325 MG PO TABS: 1.0000 | ORAL_TABLET | Freq: Four times a day (QID) | ORAL | Status: DC | PRN

## 2011-12-08 MED ORDER — DIAZEPAM 10 MG PO TABS: 10.0000 mg | ORAL_TABLET | Freq: Three times a day (TID) | ORAL | Status: DC

## 2011-12-08 MED ORDER — PREDNISONE 10 MG PO TABS: 10.0000 mg | ORAL_TABLET | Freq: Every day | ORAL | Status: DC

## 2011-12-08 NOTE — Progress Notes (Signed)
  Subjective:    Patient ID: Kaylee [name], female    DOB: 1961-01-31, 51 y.o.   MRN: [id number]  HPI Ms. Askari presents for follow-up of chronic anxiety. She reports that she is not resting well and that her axiety is not well controlled. She would like to increase her valium to 10 mg tid. She is still having back pain but did get good results with prednisone burst and taper but as the dose drops she is having more pain that is not well controlled with meloxicam. She is otherwise doing OK. Her divorce was finalized in [place]. She has housing and some financial assistance. She definitely states she is feeling better.   Past Medical History  Diagnosis Date  . ALLERGIC RHINITIS CAUSE UNSPECIFIED 03/23/2009  . ANXIETY DEPRESSION 03/26/2008  . BRONCHITIS 03/26/2008  . HIP PAIN, BILATERAL 09/21/2008  . Irritable bowel syndrome 03/26/2008  . OTITIS MEDIA, CHRONIC 03/26/2008  . PERIPHERAL EDEMA 03/26/2008  . Rheumatoid arthritis 03/26/2008  . TOBACCO ABUSE 06/24/2009  . URINARY TRACT INFECTION, CHRONIC 03/26/2008  . Rheumatoid arthritis    Past Surgical History  Procedure Date  . Mouth surgery   . Reconstructibve ear surgery 1980    Left  . Tonsillectomy   . Left foot surgery     x's 3  . Tubal ligation   . Abdominal hysterectomy     cervical dysplasia   Family History  Problem Relation Age of Onset  . Healthy Mother   . Alzheimer's disease Father   . Coronary artery disease Maternal Grandmother     MI  . Breast cancer Paternal Grandmother   . Colon cancer Paternal Grandmother    History   Social History  . Marital Status: Single    Spouse Name: N/A    Number of Children: N/A  . Years of Education: N/A   Occupational History  . Not on file.   Social History Main Topics  . Smoking status: Former Games developer  . Smokeless tobacco: Not on file  . Alcohol Use: Not on file  . Drug Use: No  . Sexually Active: Not on file   Other Topics Concern  . 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     Objective:   Physical Exam Filed Vitals:   12/08/11 1053  BP: 110/80  Pulse: 80  Temp: 98.6 F (37 C)  Resp: 16   Gen'l - WNWD white woman in no distress HEENT- C&S clear Cor- RRR Pulm - normal respirations. Neuro - A&O x 3, Normal gait and station.       Assessment & Plan:

## 2011-12-08 NOTE — Assessment & Plan Note (Signed)
Multiple sources of MSK pain including diagnosis of RA. She has been feeling much better with low dose prednisone. She is aware of the long term risks of chronic steroid use, including AVN hip.  Plan Continue prednisone 10 mg daily

## 2011-12-08 NOTE — Assessment & Plan Note (Signed)
Chronic pain treated with multiple meds  Plan - renewal of Oxycodone for 30 days with 2 additional refills.

## 2011-12-08 NOTE — Assessment & Plan Note (Signed)
Seems to be doing OK, but she reports suboptimally controlled anxiety.  Plan  Increase valium to 10 mg tid.

## 2011-12-29 ENCOUNTER — Other Ambulatory Visit: Payer: Self-pay

## 2011-12-29 MED ORDER — NITROFURANTOIN MONOHYD MACRO 100 MG PO CAPS: 100.0000 mg | ORAL_CAPSULE | Freq: Every day | ORAL | Status: DC

## 2012-09-24 DIAGNOSIS — R52 Pain, unspecified: Secondary | ICD-10-CM | POA: Diagnosis not present

## 2012-09-24 DIAGNOSIS — R069 Unspecified abnormalities of breathing: Secondary | ICD-10-CM | POA: Diagnosis not present

## 2012-09-24 DIAGNOSIS — R609 Edema, unspecified: Secondary | ICD-10-CM | POA: Diagnosis not present

## 2012-10-19 ENCOUNTER — Ambulatory Visit (INDEPENDENT_AMBULATORY_CARE_PROVIDER_SITE_OTHER): Payer: Medicare Other | Admitting: Internal Medicine

## 2012-10-19 ENCOUNTER — Encounter: Payer: Self-pay | Admitting: Internal Medicine

## 2012-10-19 VITALS — BP 122/70 | HR 79 | Temp 98.0°F | Resp 16 | Ht 66.0 in | Wt 175.0 lb

## 2012-10-19 DIAGNOSIS — N390 Urinary tract infection, site not specified: Secondary | ICD-10-CM

## 2012-10-19 DIAGNOSIS — K589 Irritable bowel syndrome without diarrhea: Secondary | ICD-10-CM

## 2012-10-19 DIAGNOSIS — M069 Rheumatoid arthritis, unspecified: Secondary | ICD-10-CM

## 2012-10-19 DIAGNOSIS — M25559 Pain in unspecified hip: Secondary | ICD-10-CM

## 2012-10-19 DIAGNOSIS — F341 Dysthymic disorder: Secondary | ICD-10-CM

## 2012-10-19 MED ORDER — EPINEPHRINE 0.3 MG/0.3ML IJ DEVI: 0.3000 mg | Freq: Once | INTRAMUSCULAR | Status: DC

## 2012-10-19 MED ORDER — OXYCODONE-ACETAMINOPHEN 5-325 MG PO TABS: 1.0000 | ORAL_TABLET | Freq: Four times a day (QID) | ORAL | Status: DC | PRN

## 2012-10-19 MED ORDER — LUBIPROSTONE 24 MCG PO CAPS: 24.0000 ug | ORAL_CAPSULE | Freq: Two times a day (BID) | ORAL | Status: DC

## 2012-10-19 NOTE — Patient Instructions (Addendum)
Welcome back  Will send in refills on all your medications  On exam there is no pitting edema that would respond to diuretics  Please have all the lab results and records sent to me from [facility]  You will need to return in a month for percocet refill.
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
## 2012-10-20 ENCOUNTER — Other Ambulatory Visit: Payer: Self-pay

## 2012-10-20 MED ORDER — NEOMYCIN-POLYMYXIN-DEXAMETH 3.5-10000-0.1 OP SUSP: 1.0000 [drp] | OPHTHALMIC | Status: DC | PRN

## 2012-10-20 MED ORDER — DICYCLOMINE HCL 10 MG PO CAPS: 10.0000 mg | ORAL_CAPSULE | Freq: Four times a day (QID) | ORAL | Status: DC | PRN

## 2012-10-20 MED ORDER — MELOXICAM 15 MG PO TABS: 15.0000 mg | ORAL_TABLET | Freq: Every day | ORAL | Status: DC

## 2012-10-20 MED ORDER — TIZANIDINE HCL 4 MG PO TABS: 4.0000 mg | ORAL_TABLET | Freq: Every day | ORAL | Status: DC

## 2012-10-20 MED ORDER — NITROFURANTOIN MACROCRYSTAL 100 MG PO CAPS: 100.0000 mg | ORAL_CAPSULE | Freq: Every day | ORAL | Status: DC

## 2012-10-20 MED ORDER — PREDNISONE 10 MG PO TABS: 10.0000 mg | ORAL_TABLET | Freq: Every day | ORAL | Status: DC

## 2012-10-20 MED ORDER — FLUCONAZOLE 150 MG PO TABS: ORAL_TABLET | ORAL | Status: DC

## 2012-10-21 ENCOUNTER — Telehealth: Payer: Self-pay | Admitting: *Deleted

## 2012-10-21 MED ORDER — DIAZEPAM 10 MG PO TABS: 10.0000 mg | ORAL_TABLET | Freq: Three times a day (TID) | ORAL | Status: DC

## 2012-10-21 NOTE — Telephone Encounter (Signed)
ok 

## 2012-10-21 NOTE — Telephone Encounter (Signed)
Left msg on triage pharmacy received refills on meds that md sent except for her valium. Pt is requesting valium to be [facility]. MD out of office this am. Is this ok...lmb

## 2012-10-21 NOTE — Telephone Encounter (Signed)
Notified pt rx call into pharmacy. Spoke with Jennifer,,,/lmb

## 2012-10-22 NOTE — Assessment & Plan Note (Signed)
Uses prednisone for relief. No joint deformity on exam. No synovial thickening or erythema. Normal ability to make a fist.   Plan Prednisone renewed.

## 2012-10-22 NOTE — Assessment & Plan Note (Signed)
Long standing problem with predominant constipation. She has tried a variety of remedies: over the counter laxatives and stool softeners, Bentyl, levsin. She has had good result with Amitiza.  Plan Renewed Bentyl Rx  Prescribed Amitiza

## 2012-10-22 NOTE — Assessment & Plan Note (Signed)
She has had an extensive evaluation over the years and was seen on many occasions at [facility]. She has no surgical problem. Her hips are a source of constant pain.  Plan Renewed her chronic percocet prescription  She will need to return in 1 month for refill and will need to complete a Controlled substance contract and have state Data base inquiry

## 2012-10-22 NOTE — Assessment & Plan Note (Signed)
Long standing problem for which she has a long standing h/o using valium  Plan - Rx renewed

## 2012-10-22 NOTE — Assessment & Plan Note (Signed)
Suppressive therapy with macrodantin renewed.

## 2012-10-25 ENCOUNTER — Telehealth: Payer: Self-pay | Admitting: Internal Medicine

## 2012-10-25 NOTE — Telephone Encounter (Signed)
Received a fax from [facility] stating prior authorization for Amitiza has been denied.

## 2012-10-25 NOTE — Telephone Encounter (Signed)
Pt called req Diflucan to be send to [facility]. Pt stated she take medication/antibiotic for her kidney everyday and doctor Norins is aware of this. Please advice.

## 2012-11-22 ENCOUNTER — Telehealth: Payer: Self-pay

## 2012-11-22 ENCOUNTER — Other Ambulatory Visit: Payer: Self-pay | Admitting: Internal Medicine

## 2012-11-22 NOTE — Telephone Encounter (Signed)
Pt calls stating [facility] pharmacy does not have her Macrodantin 100 mg prescription. I called the pharmacy and they verified they found it. I then called the pt to let her know this has been straightened out. She had no other concerns or questions.

## 2012-12-07 ENCOUNTER — Ambulatory Visit (INDEPENDENT_AMBULATORY_CARE_PROVIDER_SITE_OTHER): Payer: Self-pay | Admitting: Internal Medicine

## 2012-12-07 ENCOUNTER — Encounter: Payer: Self-pay | Admitting: Internal Medicine

## 2012-12-07 VITALS — BP 148/78 | HR 73 | Temp 97.5°F | Ht 66.0 in | Wt 173.4 lb

## 2012-12-07 DIAGNOSIS — H6690 Otitis media, unspecified, unspecified ear: Secondary | ICD-10-CM

## 2012-12-07 DIAGNOSIS — M25559 Pain in unspecified hip: Secondary | ICD-10-CM

## 2012-12-07 DIAGNOSIS — K589 Irritable bowel syndrome without diarrhea: Secondary | ICD-10-CM

## 2012-12-07 DIAGNOSIS — F341 Dysthymic disorder: Secondary | ICD-10-CM

## 2012-12-07 MED ORDER — FLUCONAZOLE 150 MG PO TABS: ORAL_TABLET | ORAL | Status: DC

## 2012-12-07 MED ORDER — OXYCODONE-ACETAMINOPHEN 5-325 MG PO TABS: 1.0000 | ORAL_TABLET | Freq: Four times a day (QID) | ORAL | Status: DC | PRN

## 2012-12-07 MED ORDER — NEOMYCIN-POLYMYXIN-HC 3.5-10000-1 OT SOLN: 3.0000 [drp] | Freq: Four times a day (QID) | OTIC | Status: DC

## 2012-12-11 NOTE — Assessment & Plan Note (Signed)
Continues to take percocet. Has been reliable in use and timely refills.   Plan  refill Rx. At next monthly visit will consider going to 3 month intervals

## 2012-12-11 NOTE — Assessment & Plan Note (Signed)
Stable without exacerbation of symptoms

## 2012-12-11 NOTE — Assessment & Plan Note (Signed)
Stable on current medications. Reliable in med use and timely refills.  Plan - continue present regimen

## 2012-12-11 NOTE — Progress Notes (Signed)
  Subjective:    Patient ID: Kaylee [name], female    DOB: October 23, 1960, 52 y.o.   MRN: [id number]  HPI Kaylee [name] presents for medication refills. In the interval since her last visit she reports she is doing well: pain and anxiety are well controlled. She has not had excessive somnolence, no constipation. 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   PMH, FamHx and SocHx reviewed for any changes and relevance.    Current Outpatient Prescriptions on File Prior to Visit  Medication Sig Dispense Refill  . diazepam (VALIUM) 10 MG tablet Take 1 tablet (10 mg total) by mouth 3 (three) times daily.  90 tablet  5  . dicyclomine (BENTYL) 10 MG capsule Take 1 capsule (10 mg total) by mouth every 6 (six) hours as needed.  120 capsule  5  . EPINEPHrine (EPIPEN) 0.3 mg/0.3 mL DEVI Inject 0.3 mLs (0.3 mg total) into the muscle once.  1 Device  1  . lubiprostone (AMITIZA) 24 MCG capsule Take 1 capsule (24 mcg total) by mouth 2 (two) times daily with a meal.  60 capsule  5  . meloxicam (MOBIC) 15 MG tablet Take 1 tablet (15 mg total) by mouth daily.  30 tablet  5  . neomycin-polymyxin b-dexamethasone (MAXITROL) 3.5-10000-0.1 SUSP Place 1 drop into the left eye as needed.  5 mL  5  . nitrofurantoin (MACRODANTIN) 100 MG capsule Take 1 capsule (100 mg total) by mouth daily.  30 capsule  5  . predniSONE (DELTASONE) 10 MG tablet Take 1 tablet (10 mg total) by mouth daily. 3 tabs daily for 3 days; 2 tabs daily for 3 days; 1 tab daily for 6 days  100 tablet  1  . tiZANidine (ZANAFLEX) 4 MG tablet Take 1 tablet (4 mg total) by mouth at bedtime.  30 tablet  5   No current facility-administered medications on file prior to visit.      Review of Systems System review is negative for any constitutional, cardiac, pulmonary, GI or neuro symptoms or complaints other than as described in the HPI.     Objective:   Physical Exam Filed  Vitals:   12/07/12 1407  BP: 148/78  Pulse: 73  Temp: 97.5 F (36.4 C)   Wt Readings from Last 3 Encounters:  12/07/12 173 lb 6.4 oz (78.654 kg)  10/19/12 175 lb (79.379 kg)  12/08/11 172 lb (78.019 kg)   Gen'- WNWD white woman in no distress HEENT - C&S clear, PERRLA Cor- RRR Pulm - normal respirations Neuro - A&O x 3, speech clear, normal gait.       Assessment & Plan:

## 2012-12-11 NOTE — Assessment & Plan Note (Signed)
No flares at this time

## 2013-01-04 ENCOUNTER — Encounter: Payer: Self-pay | Admitting: Internal Medicine

## 2013-01-04 ENCOUNTER — Ambulatory Visit (INDEPENDENT_AMBULATORY_CARE_PROVIDER_SITE_OTHER): Payer: Medicare Other | Admitting: Internal Medicine

## 2013-01-04 VITALS — BP 116/86 | HR 81 | Temp 97.7°F | Wt 176.4 lb

## 2013-01-04 DIAGNOSIS — M25559 Pain in unspecified hip: Secondary | ICD-10-CM | POA: Diagnosis not present

## 2013-01-04 DIAGNOSIS — F341 Dysthymic disorder: Secondary | ICD-10-CM | POA: Diagnosis not present

## 2013-01-04 DIAGNOSIS — M069 Rheumatoid arthritis, unspecified: Secondary | ICD-10-CM | POA: Diagnosis not present

## 2013-01-04 DIAGNOSIS — K589 Irritable bowel syndrome without diarrhea: Secondary | ICD-10-CM

## 2013-01-04 MED ORDER — OXYCODONE-ACETAMINOPHEN 5-325 MG PO TABS: 1.0000 | ORAL_TABLET | Freq: Four times a day (QID) | ORAL | Status: DC | PRN

## 2013-01-04 MED ORDER — ESOMEPRAZOLE MAGNESIUM 40 MG PO CPDR: 40.0000 mg | DELAYED_RELEASE_CAPSULE | Freq: Every day | ORAL | Status: DC

## 2013-01-04 MED ORDER — ALBUTEROL SULFATE HFA 108 (90 BASE) MCG/ACT IN AERS: 2.0000 | INHALATION_SPRAY | Freq: Four times a day (QID) | RESPIRATORY_TRACT | Status: DC | PRN

## 2013-01-04 MED ORDER — DICLOFENAC-MISOPROSTOL 75-0.2 MG PO TBEC: 1.0000 | DELAYED_RELEASE_TABLET | Freq: Two times a day (BID) | ORAL | Status: DC

## 2013-01-04 NOTE — Assessment & Plan Note (Signed)
Doing very well: good spirits, very active, exercising

## 2013-01-04 NOTE — Progress Notes (Signed)
  Subjective:    Patient ID: Kaylee [name], female    DOB: 01-Nov-1960, 52 y.o.   MRN: [id number]  HPI Kaylee [name] presents for follow up. She is asking to switch from meloxicam to Arthrotec. She requests an albuterol inhaler for prn use due to respiratory distress associated with humidity and exercise. She also c/o snoring that is severe. She also needs to have her pain medications renewed. She has been active - going to the gym and gardening. IBS w/ constipation is doing well with amiteza.  PMH, FamHx and SocHx reviewed for any changes and relevance.  Current Outpatient Prescriptions on File Prior to Visit  Medication Sig Dispense Refill  . diazepam (VALIUM) 10 MG tablet Take 1 tablet (10 mg total) by mouth 3 (three) times daily.  90 tablet  5  . dicyclomine (BENTYL) 10 MG capsule Take 1 capsule (10 mg total) by mouth every 6 (six) hours as needed.  120 capsule  5  . EPINEPHrine (EPIPEN) 0.3 mg/0.3 mL DEVI Inject 0.3 mLs (0.3 mg total) into the muscle once.  1 Device  1  . fluconazole (DIFLUCAN) 150 MG tablet Take one tablet after completing antibiotics for upper respiratory infection.  3 tablet  3  . lubiprostone (AMITIZA) 24 MCG capsule Take 1 capsule (24 mcg total) by mouth 2 (two) times daily with a meal.  60 capsule  5  . meloxicam (MOBIC) 15 MG tablet Take 1 tablet (15 mg total) by mouth daily.  30 tablet  5  . neomycin-polymyxin b-dexamethasone (MAXITROL) 3.5-10000-0.1 SUSP Place 1 drop into the left eye as needed.  5 mL  5  . neomycin-polymyxin-hydrocortisone (CORTISPORIN) otic solution Place 3 drops into both ears 4 (four) times daily.  10 mL  3  . nitrofurantoin (MACRODANTIN) 100 MG capsule Take 1 capsule (100 mg total) by mouth daily.  30 capsule  5  . predniSONE (DELTASONE) 10 MG tablet Take 1 tablet (10 mg total) by mouth daily. 3 tabs daily for 3 days; 2 tabs daily for 3 days; 1 tab daily for 6 days  100 tablet  1  . tiZANidine (ZANAFLEX) 4 MG tablet Take 1 tablet (4 mg total)  by mouth at bedtime.  30 tablet  5   No current facility-administered medications on file prior to visit.      Review of Systems System review is negative for any constitutional, cardiac, pulmonary, GI or neuro symptoms or complaints other than as described in the HPI.     Objective:   Physical Exam Filed Vitals:   01/04/13 1417  BP: 116/86  Pulse: 81  Temp: 97.7 F (36.5 C)   Gen'l - WNWD white woman, well tanned HEENT- C&S clear Cor RRR Pulm - normal respirations Neuro - A&O, normal gait and station.        Assessment & Plan:

## 2013-01-04 NOTE — Assessment & Plan Note (Signed)
Requesting change in medication from meloxicam to arthrotec 75/0.2 bid

## 2013-01-04 NOTE — Assessment & Plan Note (Signed)
Stable on current regimen including Ametiza

## 2013-01-04 NOTE — Assessment & Plan Note (Signed)
Continue on percocet qid on schedule. No problems of sedation, no GI complaints. Has been returning on time monthly for refills.  Plan Refills 30 day supply with 3 refills.

## 2013-01-05 ENCOUNTER — Telehealth: Payer: Self-pay

## 2013-01-05 NOTE — Telephone Encounter (Signed)
Received a faxed from [facility] pharmacy that prior authorization is needed for Arthrotec 75/0.2 mg and Nexium 40 mg. [place] forms for both medications have been filled out and electronically sent [employer].

## 2013-01-09 ENCOUNTER — Ambulatory Visit: Payer: Self-pay | Admitting: Internal Medicine

## 2013-01-09 NOTE — Telephone Encounter (Signed)
Received email notification that prior authorization for Nexium 40 mg and Arthrotec 75/0.2 mg has been denied. Notified pharmacy via fax. Called patient and left a voicemail to return call so she can be notified of denial.

## 2013-01-27 ENCOUNTER — Other Ambulatory Visit: Payer: Self-pay

## 2013-01-27 MED ORDER — LANSOPRAZOLE 30 MG PO CPDR: 30.0000 mg | DELAYED_RELEASE_CAPSULE | Freq: Every day | ORAL | Status: DC

## 2013-01-27 NOTE — Telephone Encounter (Signed)
Since Nexium is not covered under patient's insurance Lansoprazole 30 mg #30 with 11 refills sent to her pharmacy per Dr Debby Bud.

## 2013-02-08 ENCOUNTER — Ambulatory Visit: Payer: Self-pay | Admitting: Internal Medicine

## 2013-02-09 ENCOUNTER — Telehealth: Payer: Self-pay | Admitting: *Deleted

## 2013-02-09 NOTE — Telephone Encounter (Signed)
ok 

## 2013-02-09 NOTE — Telephone Encounter (Signed)
Pt called states she has been taking additional Microbid due to kidney infection flare.  She is requesting a refill for a 7 day additional supply.  please advise

## 2013-03-09 ENCOUNTER — Ambulatory Visit: Payer: Self-pay | Admitting: Internal Medicine

## 2013-03-16 ENCOUNTER — Ambulatory Visit: Payer: Self-pay | Admitting: Internal Medicine

## 2013-04-06 ENCOUNTER — Ambulatory Visit: Payer: Self-pay | Admitting: Internal Medicine

## 2013-04-11 ENCOUNTER — Encounter: Payer: Self-pay | Admitting: Internal Medicine

## 2013-04-11 ENCOUNTER — Ambulatory Visit (INDEPENDENT_AMBULATORY_CARE_PROVIDER_SITE_OTHER): Payer: Medicare Other | Admitting: Internal Medicine

## 2013-04-11 VITALS — BP 126/68 | HR 80 | Temp 96.9°F | Wt 184.8 lb

## 2013-04-11 DIAGNOSIS — M25559 Pain in unspecified hip: Secondary | ICD-10-CM | POA: Diagnosis not present

## 2013-04-11 DIAGNOSIS — M069 Rheumatoid arthritis, unspecified: Secondary | ICD-10-CM

## 2013-04-11 MED ORDER — DIAZEPAM 10 MG PO TABS: 10.0000 mg | ORAL_TABLET | Freq: Three times a day (TID) | ORAL | Status: DC

## 2013-04-11 MED ORDER — FLUCONAZOLE 150 MG PO TABS: ORAL_TABLET | ORAL | Status: DC

## 2013-04-11 MED ORDER — EPINEPHRINE 0.3 MG/0.3ML IJ SOAJ: 0.3000 mg | Freq: Once | INTRAMUSCULAR | Status: DC

## 2013-04-11 MED ORDER — OXYCODONE-ACETAMINOPHEN 5-325 MG PO TABS: 1.0000 | ORAL_TABLET | Freq: Four times a day (QID) | ORAL | Status: DC | PRN

## 2013-04-11 MED ORDER — AMOXICILLIN 875 MG PO TABS: 875.0000 mg | ORAL_TABLET | Freq: Two times a day (BID) | ORAL | Status: DC

## 2013-04-11 MED ORDER — DICLOFENAC-MISOPROSTOL 75-0.2 MG PO TBEC: 1.0000 | DELAYED_RELEASE_TABLET | Freq: Two times a day (BID) | ORAL | Status: DC

## 2013-04-11 NOTE — Patient Instructions (Addendum)
Good to see you and congratulations on the horse, the truck and the license  Renewed meds as needed.  Upper respiratory infection - no active signs of infection at today's exam. You can start antibiotics now or you can wait until you have more conclusive signs of infection: fever, sore throat, purulent drainage - thick, green or colored, bitter tasting. You can also use over the counter sudafed for congestion, cough syrup of choice.
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
## 2013-04-11 NOTE — Assessment & Plan Note (Signed)
chronic pain medications renewed 30 days with 3 Rxs.

## 2013-04-11 NOTE — Assessment & Plan Note (Signed)
Doing well on Arthrotec and occasional prednisone. No evidence of joint flare-up today.

## 2013-04-21 ENCOUNTER — Other Ambulatory Visit: Payer: Self-pay | Admitting: Internal Medicine

## 2013-06-06 ENCOUNTER — Ambulatory Visit: Payer: Medicaid Other | Admitting: Internal Medicine

## 2013-06-07 ENCOUNTER — Other Ambulatory Visit: Payer: Self-pay | Admitting: *Deleted

## 2013-06-07 ENCOUNTER — Telehealth: Payer: Self-pay | Admitting: *Deleted

## 2013-06-07 MED ORDER — FLUCONAZOLE 150 MG PO TABS: ORAL_TABLET | ORAL | Status: DC

## 2013-06-07 NOTE — Telephone Encounter (Signed)
Pt called requesting Diflucan refill, states she lost medication.  Please advise

## 2013-06-07 NOTE — Telephone Encounter (Signed)
.  Left message on VM Rx sent to pharmacy.

## 2013-06-07 NOTE — Telephone Encounter (Signed)
Ok to refill diflucan 

## 2013-06-14 ENCOUNTER — Ambulatory Visit: Payer: Self-pay | Admitting: Internal Medicine

## 2013-07-11 ENCOUNTER — Other Ambulatory Visit: Payer: Self-pay | Admitting: Internal Medicine

## 2013-07-11 ENCOUNTER — Ambulatory Visit: Payer: Self-pay | Admitting: Internal Medicine

## 2013-07-17 ENCOUNTER — Encounter: Payer: Self-pay | Admitting: Internal Medicine

## 2013-07-17 ENCOUNTER — Ambulatory Visit (INDEPENDENT_AMBULATORY_CARE_PROVIDER_SITE_OTHER): Payer: Medicare Other | Admitting: Internal Medicine

## 2013-07-17 VITALS — BP 138/86 | HR 72 | Temp 97.9°F | Wt 181.8 lb

## 2013-07-17 DIAGNOSIS — Z1211 Encounter for screening for malignant neoplasm of colon: Secondary | ICD-10-CM

## 2013-07-17 DIAGNOSIS — M069 Rheumatoid arthritis, unspecified: Secondary | ICD-10-CM | POA: Diagnosis not present

## 2013-07-17 DIAGNOSIS — R609 Edema, unspecified: Secondary | ICD-10-CM

## 2013-07-17 DIAGNOSIS — M25559 Pain in unspecified hip: Secondary | ICD-10-CM

## 2013-07-17 DIAGNOSIS — K589 Irritable bowel syndrome without diarrhea: Secondary | ICD-10-CM | POA: Diagnosis not present

## 2013-07-17 DIAGNOSIS — Z0000 Encounter for general adult medical examination without abnormal findings: Secondary | ICD-10-CM | POA: Diagnosis not present

## 2013-07-17 DIAGNOSIS — F341 Dysthymic disorder: Secondary | ICD-10-CM

## 2013-07-17 MED ORDER — OXYCODONE-ACETAMINOPHEN 5-325 MG PO TABS: 1.0000 | ORAL_TABLET | Freq: Four times a day (QID) | ORAL | Status: DC | PRN

## 2013-07-17 MED ORDER — NEOMYCIN-POLYMYXIN-HC 3.5-10000-1 OT SOLN: 3.0000 [drp] | Freq: Four times a day (QID) | OTIC | Status: DC

## 2013-07-17 MED ORDER — DIAZEPAM 10 MG PO TABS: 10.0000 mg | ORAL_TABLET | Freq: Three times a day (TID) | ORAL | Status: DC

## 2013-07-17 NOTE — Patient Instructions (Signed)
Happy New Year.  Renewal pain medications today. No complications from medications.  Routine lab today: cholesterol, kidney function, blood sugar. Results either by MyChart or by letter.  You will be scheduled for colonoscopy - you should get a call in the next week to set up a pre-op nurse visit and the procedure.  Call back if we need to split the arthrotec into voltaren and misoprisol.

## 2013-07-17 NOTE — Progress Notes (Signed)
Pre visit review using our clinic review tool, if applicable. No additional management support is needed unless otherwise documented below in the visit note. 

## 2013-07-18 ENCOUNTER — Telehealth: Payer: Self-pay

## 2013-07-18 MED ORDER — DICLOFENAC SODIUM 75 MG PO TBEC: 75.0000 mg | DELAYED_RELEASE_TABLET | Freq: Two times a day (BID) | ORAL | Status: DC

## 2013-07-18 MED ORDER — MISOPROSTOL 200 MCG PO TABS: 200.0000 ug | ORAL_TABLET | Freq: Two times a day (BID) | ORAL | Status: DC

## 2013-07-18 NOTE — Telephone Encounter (Signed)
Received a fax from [facility] requesting 2 separate meds. One for Diclfenac 75 mg and one for Misoprostol 0.2 mg as this will cost less than refilling the combination med. Please advise.

## 2013-07-18 NOTE — Telephone Encounter (Signed)
rx's sent and med list updated

## 2013-07-18 NOTE — Telephone Encounter (Signed)
Ok 1. Diclofenac 75 mg sig bid, # 60 refill prn 2. Misoprostol 0.2 mg sig bid, #60 refill prn  Please update med list - these two replace arthrotec

## 2013-07-19 NOTE — Assessment & Plan Note (Signed)
Ms. Wickersham is aware of the risk of habituation with benzodiazepines and the possible loss of effectiveness. She has tried and failed other anxiolytics, including SSRIs. Check controlled substance data bank - no irregularities  Plan Medications refilled.

## 2013-07-19 NOTE — Assessment & Plan Note (Signed)
Stable with reasonable pain control on present regimen. Queried controlled substance data base - no irregularities. Refills provided.
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
## 2013-07-24 ENCOUNTER — Encounter: Payer: Self-pay | Admitting: Internal Medicine

## 2013-07-24 ENCOUNTER — Other Ambulatory Visit (INDEPENDENT_AMBULATORY_CARE_PROVIDER_SITE_OTHER): Payer: Medicare Other

## 2013-07-24 DIAGNOSIS — K589 Irritable bowel syndrome without diarrhea: Secondary | ICD-10-CM

## 2013-07-24 DIAGNOSIS — Z0000 Encounter for general adult medical examination without abnormal findings: Secondary | ICD-10-CM | POA: Diagnosis not present

## 2013-07-24 DIAGNOSIS — R609 Edema, unspecified: Secondary | ICD-10-CM

## 2013-07-24 DIAGNOSIS — M069 Rheumatoid arthritis, unspecified: Secondary | ICD-10-CM

## 2013-07-24 LAB — LIPID PANEL
CHOL/HDL RATIO: 5
Cholesterol: 244 mg/dL — ABNORMAL HIGH (ref 0–200)
HDL: 53.9 mg/dL (ref >39.00)
TRIGLYCERIDES: 127 mg/dL (ref 0.0–149.0)
VLDL: 25.4 mg/dL (ref 0.0–40.0)

## 2013-07-24 LAB — COMPREHENSIVE METABOLIC PANEL
ALBUMIN: 4.1 g/dL (ref 3.5–5.2)
ALK PHOS: 50 U/L (ref 39–117)
ALT: 25 U/L (ref 0–35)
AST: 25 U/L (ref 0–37)
BUN: 18 mg/dL (ref 6–23)
CO2: 28 mEq/L (ref 19–32)
Calcium: 9.7 mg/dL (ref 8.4–10.5)
Chloride: 105 mEq/L (ref 96–112)
Creatinine, Ser: 0.9 mg/dL (ref 0.4–1.2)
GFR: 72.2 mL/min (ref >60.00)
Glucose, Bld: 98 mg/dL (ref 70–99)
Potassium: 4.2 mEq/L (ref 3.5–5.1)
SODIUM: 140 meq/L (ref 135–145)
Total Bilirubin: 0.6 mg/dL (ref 0.3–1.2)
Total Protein: 6.8 g/dL (ref 6.0–8.3)

## 2013-07-24 LAB — HEPATIC FUNCTION PANEL
ALT: 25 U/L (ref 0–35)
AST: 25 U/L (ref 0–37)
Albumin: 4.1 g/dL (ref 3.5–5.2)
Alkaline Phosphatase: 50 U/L (ref 39–117)
BILIRUBIN DIRECT: 0.1 mg/dL (ref 0.0–0.3)
TOTAL PROTEIN: 6.8 g/dL (ref 6.0–8.3)
Total Bilirubin: 0.6 mg/dL (ref 0.3–1.2)

## 2013-07-24 LAB — LDL CHOLESTEROL, DIRECT: Direct LDL: 164.4 mg/dL

## 2013-08-18 ENCOUNTER — Telehealth: Payer: Self-pay
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
## 2013-08-22 NOTE — Telephone Encounter (Signed)
Billing matter handled outside of pt's chart.

## 2013-09-08 ENCOUNTER — Ambulatory Visit (AMBULATORY_SURGERY_CENTER): Payer: Medicare Other

## 2013-09-08 VITALS — Ht 66.0 in | Wt 181.0 lb

## 2013-09-08 DIAGNOSIS — Z800 Family history of malignant neoplasm of digestive organs: Secondary | ICD-10-CM

## 2013-09-08 MED ORDER — SUPREP BOWEL PREP KIT 17.5-3.13-1.6 GM/177ML PO SOLN: 1.0000 | Freq: Once | ORAL | Status: DC

## 2013-09-10 HISTORY — PX: COLONOSCOPY: SHX174

## 2013-09-22 ENCOUNTER — Ambulatory Visit (AMBULATORY_SURGERY_CENTER): Payer: Medicare Other | Admitting: Internal Medicine

## 2013-09-22 ENCOUNTER — Encounter: Payer: Self-pay | Admitting: Internal Medicine

## 2013-09-22 VITALS — BP 121/75 | HR 62 | Temp 97.8°F | Resp 19 | Ht 66.0 in | Wt 181.0 lb

## 2013-09-22 DIAGNOSIS — K589 Irritable bowel syndrome without diarrhea: Secondary | ICD-10-CM | POA: Diagnosis not present

## 2013-09-22 DIAGNOSIS — Z885 Allergy status to narcotic agent status: Secondary | ICD-10-CM | POA: Diagnosis not present

## 2013-09-22 DIAGNOSIS — Z1211 Encounter for screening for malignant neoplasm of colon: Secondary | ICD-10-CM

## 2013-09-22 DIAGNOSIS — M069 Rheumatoid arthritis, unspecified: Secondary | ICD-10-CM | POA: Diagnosis not present

## 2013-09-22 MED ORDER — SODIUM CHLORIDE 0.9 % IV SOLN: 500.0000 mL | INTRAVENOUS | Status: DC

## 2013-09-22 NOTE — Patient Instructions (Addendum)
The colonoscopy was normal!  Next routine colonoscopy in 10 years - 2025  I appreciate the opportunity to care for you. Aidel Davisson E. Adylin Hankey, MD, FACG   YOU HAD AN ENDOSCOPIC PROCEDURE TODAY AT THE [facility]: Refer to the procedure report that was given to you for any specific questions about what was found during the examination.  If the procedure report does not answer your questions, please call your gastroenterologist to clarify.  If you requested that your care partner not be given the details of your procedure findings, then the procedure report has been included in a sealed envelope for you to review at your convenience later.  YOU SHOULD EXPECT: Some feelings of bloating in the abdomen. Passage of more gas than usual.  Walking can help get rid of the air that was put into your GI tract during the procedure and reduce the bloating. If you had a lower endoscopy (such as a colonoscopy or flexible sigmoidoscopy) you may notice spotting of blood in your stool or on the toilet paper. If you underwent a bowel prep for your procedure, then you may not have a normal bowel movement for a few days.  DIET: Your first meal following the procedure should be a light meal and then it is ok to progress to your normal diet.  A half-sandwich or bowl of soup is an example of a good first meal.  Heavy or fried foods are harder to digest and may make you feel nauseous or bloated.  Likewise meals heavy in dairy and vegetables can cause extra gas to form and this can also increase the bloating.  Drink plenty of fluids but you should avoid alcoholic beverages for 24 hours.  ACTIVITY: Your care partner should take you home directly after the procedure.  You should plan to take it easy, moving slowly for the rest of the day.  You can resume normal activity the day after the procedure however you should NOT DRIVE or use heavy machinery for 24 hours (because of the sedation medicines used during the test).     SYMPTOMS TO REPORT IMMEDIATELY: A gastroenterologist can be reached at any hour.  During normal business hours, 8:30 AM to 5:00 PM Monday through Friday, call [phone].  After hours and on weekends, please call the GI answering service at [phone] who will take a message and have the physician on call contact you.   Following lower endoscopy (colonoscopy or flexible sigmoidoscopy):  Excessive amounts of blood in the stool  Significant tenderness or worsening of abdominal pains  Swelling of the abdomen that is new, acute  Fever of 100F or higher  FOLLOW UP: If any biopsies were taken you will be contacted by phone or by letter within the next 1-3 weeks.  Call your gastroenterologist if you have not heard about the biopsies in 3 weeks.  Our staff will call the home number listed on your records the next business day following your procedure to check on you and address any questions or concerns that you may have at that time regarding the information given to you following your procedure. This is a courtesy call and so if there is no answer at the home number and we have not heard from you through the emergency physician on call, we will assume that you have returned to your regular daily activities without incident.  SIGNATURES/CONFIDENTIALITY: You and/or your care partner have signed paperwork which will be entered into your electronic medical record.  These   signatures attest to the fact that that the information above on your After Visit Summary has been reviewed and is understood.  Full responsibility of the confidentiality of this discharge information lies with you and/or your care-partner. 

## 2013-09-22 NOTE — Op Note (Signed)
[facility] [address] [place], [address]   COLONOSCOPY PROCEDURE REPORT  PATIENT: Kaylee [name], Kaylee [name]  MR#: [id number] BIRTHDATE: [birth year] , 52  yrs. old GENDER: Female ENDOSCOPIST: Iva Boop, MD, [facility] REFERRED OP:[id number] Esther Hardy, M.D. PROCEDURE DATE:  09/22/2013 PROCEDURE:   Colonoscopy, screening First Screening Colonoscopy - Avg.  risk and is 50 yrs.  old or older Yes.  Prior Negative Screening - Now for repeat screening. N/A  History of Adenoma - Now for follow-up colonoscopy & has been > or = to 3 yrs.  N/A  Polyps Removed Today? No.  Recommend repeat exam, <10 yrs? No. ASA CLASS:   Class II INDICATIONS:average risk screening and first colonoscopy. MEDICATIONS: propofol (Diprivan) 300mg  IV, MAC sedation, administered by CRNA, and These medications were titrated to patient response per physician's verbal order  DESCRIPTION OF PROCEDURE:   After the risks benefits and alternatives of the procedure were thoroughly explained, informed consent was obtained.  A digital rectal exam revealed no abnormalities of the rectum.   The LB [id number]  endoscope was introduced through the anus and advanced to the cecum, which was identified by both the appendix and ileocecal valve. No adverse events experienced.   The quality of the prep was Suprep good  The instrument was then slowly withdrawn as the colon was fully examined.      COLON FINDINGS: The colonic mucosa appeared normal throughout the entire examined colon.   A right colon retroflexion was performed. Retroflexed views revealed no abnormalities. The time to cecum=1 minutes 50 seconds.  Withdrawal time=11 minutes 55 seconds.  The scope was withdrawn and the procedure completed. COMPLICATIONS: There were no complications.  ENDOSCOPIC IMPRESSION: Normal colonoscopy - good prep - first screening  RECOMMENDATIONS: Repeat Colonscopy in 10 years.   eSigned:  [id number], MD, [facility]  09/22/2013 9:47 AM   cc: 09/24/2013, MD and The Patient   PATIENT NAME:  [name], [name] MR#: [name]

## 2013-09-22 NOTE — Progress Notes (Signed)
  [facility] Anesthesia Post-op Note  Patient: Kaylee [name]  Procedure(s) Performed: colonoscopy  Patient Location: [facility] - Recovery Area  Anesthesia Type: Deep Sedation/Propofol  Level of Consciousness: awake, oriented and patient cooperative  Airway and Oxygen Therapy: Patient Spontanous Breathing  Post-op Pain: none  Post-op Assessment:  Post-op Vital signs reviewed, Patient's Cardiovascular Status Stable, Respiratory Function Stable, Patent Airway, No signs of Nausea or vomiting and Pain level controlled  Post-op Vital Signs: Reviewed and stable  Complications: No apparent anesthesia complications  Auren Valdes E 9:46 AM

## 2013-09-25 ENCOUNTER — Telehealth: Payer: Self-pay | Admitting: *Deleted

## 2013-09-25 NOTE — Telephone Encounter (Signed)
  Follow up Call-  Call back number 09/22/2013  Post procedure Call Back phone  # [phone]  Permission to leave phone message Yes     Patient questions:  Do you have a fever, pain , or abdominal swelling? no Pain Score  0 *  Have you tolerated food without any problems? yes  Have you been able to return to your normal activities? yes  Do you have any questions about your discharge instructions: Diet   no Medications  no Follow up visit  no  Do you have questions or concerns about your Care? no  Actions: * If pain score is 4 or above: No action needed, pain <4.

## 2013-10-04 ENCOUNTER — Encounter: Payer: Self-pay | Admitting: Internal Medicine

## 2013-10-04 ENCOUNTER — Ambulatory Visit (INDEPENDENT_AMBULATORY_CARE_PROVIDER_SITE_OTHER): Payer: Medicare Other | Admitting: Internal Medicine

## 2013-10-04 VITALS — BP 138/78 | HR 84 | Temp 98.3°F | Wt 178.2 lb

## 2013-10-04 DIAGNOSIS — M069 Rheumatoid arthritis, unspecified: Secondary | ICD-10-CM | POA: Diagnosis not present

## 2013-10-04 DIAGNOSIS — M25559 Pain in unspecified hip: Secondary | ICD-10-CM

## 2013-10-04 DIAGNOSIS — F341 Dysthymic disorder: Secondary | ICD-10-CM

## 2013-10-04 DIAGNOSIS — Z1239 Encounter for other screening for malignant neoplasm of breast: Secondary | ICD-10-CM | POA: Diagnosis not present

## 2013-10-04 MED ORDER — OXYCODONE-ACETAMINOPHEN 5-325 MG PO TABS: 1.0000 | ORAL_TABLET | Freq: Four times a day (QID) | ORAL | Status: DC | PRN

## 2013-10-04 MED ORDER — PREDNISONE 10 MG PO TABS: 10.0000 mg | ORAL_TABLET | Freq: Every day | ORAL | Status: DC

## 2013-10-04 MED ORDER — DIAZEPAM 10 MG PO TABS: 10.0000 mg | ORAL_TABLET | Freq: Three times a day (TID) | ORAL | Status: DC

## 2013-10-04 NOTE — Patient Instructions (Signed)
1. Rheumatology - referral place to see Dr. Azzie Roup. You will be notified. Ok to take the prednisone 10 mg once a day for flare of prednisone. Be sure to take Vit D 3 1, 000 iu daily and calcium.   2. [facility]  3. Chronic pain medications - Refills provided today.  4. On-going care - will transfer you care to Dr. Sharen Hones at [facility].   5. Please continue all your regular medications but hold the voltaren while taking prednisone

## 2013-10-04 NOTE — Progress Notes (Signed)
Pre visit review using our clinic review tool, if applicable. No additional management support is needed unless otherwise documented below in the visit note. 

## 2013-10-06 ENCOUNTER — Other Ambulatory Visit: Payer: Self-pay | Admitting: Internal Medicine

## 2013-10-06 DIAGNOSIS — Z1231 Encounter for screening mammogram for malignant neoplasm of breast: Secondary | ICD-10-CM

## 2013-10-07 NOTE — Assessment & Plan Note (Signed)
Patient with progressive pain by her report. By recollection she was either sero-negative or only mildly sero-positive. Last RF here in '09 was negative  Plan Course of prednisone for pain relief  Refer to Dr. Dareen Piano for rheum consult
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
## 2013-10-08 NOTE — Assessment & Plan Note (Signed)
Long term use of diazepam for anxiety. Has tried alternative anxiolytics with suboptimal results.   Plan No change in regimen. She is aware of the high potential for side effects including habituation, rebound anxiety, somnolence, increased risk of fall and injury.

## 2013-10-08 NOTE — Assessment & Plan Note (Signed)
Patient complains of increasing hip/pelvic pain. She does remain active and able to manage all ADLs.  Plan Renewal of chronic pain meds  Rheumatology referral.

## 2013-10-17 ENCOUNTER — Ambulatory Visit
Admission: RE | Admit: 2013-10-17 | Discharge: 2013-10-17 | Disposition: A | Discharge status: Home / Self Care | Payer: Medicare Other | Source: Ambulatory Visit | Attending: Internal Medicine | Admitting: Internal Medicine

## 2013-10-17 DIAGNOSIS — Z1231 Encounter for screening mammogram for malignant neoplasm of breast: Secondary | ICD-10-CM

## 2013-10-18 DIAGNOSIS — H20019 Primary iridocyclitis, unspecified eye: Secondary | ICD-10-CM | POA: Diagnosis not present

## 2013-10-25 ENCOUNTER — Other Ambulatory Visit: Payer: Self-pay

## 2013-10-25 ENCOUNTER — Other Ambulatory Visit: Payer: Self-pay | Admitting: Internal Medicine

## 2013-10-25 DIAGNOSIS — Z1231 Encounter for screening mammogram for malignant neoplasm of breast: Secondary | ICD-10-CM

## 2013-10-25 MED ORDER — NITROFURANTOIN MACROCRYSTAL 100 MG PO CAPS: ORAL_CAPSULE | ORAL | Status: DC

## 2013-11-06 ENCOUNTER — Encounter (INDEPENDENT_AMBULATORY_CARE_PROVIDER_SITE_OTHER): Payer: Self-pay

## 2013-11-06 ENCOUNTER — Ambulatory Visit
Admission: RE | Admit: 2013-11-06 | Discharge: 2013-11-06 | Disposition: A | Discharge status: Home / Self Care | Payer: Medicare Other | Source: Ambulatory Visit | Attending: Internal Medicine | Admitting: Internal Medicine

## 2013-11-06 DIAGNOSIS — Z1231 Encounter for screening mammogram for malignant neoplasm of breast: Secondary | ICD-10-CM | POA: Diagnosis not present

## 2013-11-08 ENCOUNTER — Encounter: Payer: Self-pay | Admitting: *Deleted

## 2013-11-08 LAB — HM MAMMOGRAPHY
HM Mammogram: NORMAL
HM Mammogram: NORMAL

## 2013-11-10 DIAGNOSIS — R892 Abnormal level of other drugs, medicaments and biological substances in specimens from other organs, systems and tissues: Secondary | ICD-10-CM

## 2013-11-10 HISTORY — DX: Abnormal level of other drugs, medicaments and biological substances in specimens from other organs, systems and tissues: R89.2

## 2013-11-15 ENCOUNTER — Other Ambulatory Visit: Payer: Self-pay | Admitting: *Deleted

## 2013-11-15 NOTE — Telephone Encounter (Signed)
Transfer patient from Dr. Debby Bud.  Appointment scheduled with Dr. Sharen Hones 11/21/2013.  Ok to refill?

## 2013-11-16 MED ORDER — DICYCLOMINE HCL 10 MG PO CAPS: 10.0000 mg | ORAL_CAPSULE | Freq: Four times a day (QID) | ORAL | Status: DC | PRN

## 2013-11-20 DIAGNOSIS — S59909A Unspecified injury of unspecified elbow, initial encounter: Secondary | ICD-10-CM | POA: Diagnosis not present

## 2013-11-20 DIAGNOSIS — S6990XA Unspecified injury of unspecified wrist, hand and finger(s), initial encounter: Secondary | ICD-10-CM | POA: Diagnosis not present

## 2013-11-21 ENCOUNTER — Encounter: Payer: Self-pay | Admitting: *Deleted

## 2013-11-21 ENCOUNTER — Ambulatory Visit (INDEPENDENT_AMBULATORY_CARE_PROVIDER_SITE_OTHER): Payer: Medicare Other | Admitting: Family Medicine

## 2013-11-21 ENCOUNTER — Telehealth: Payer: Self-pay | Admitting: Family Medicine

## 2013-11-21 ENCOUNTER — Encounter: Payer: Self-pay | Admitting: Family Medicine

## 2013-11-21 VITALS — BP 138/88 | HR 80 | Temp 98.3°F | Wt 181.0 lb

## 2013-11-21 DIAGNOSIS — F341 Dysthymic disorder: Secondary | ICD-10-CM

## 2013-11-21 DIAGNOSIS — G894 Chronic pain syndrome: Secondary | ICD-10-CM

## 2013-11-21 DIAGNOSIS — F17200 Nicotine dependence, unspecified, uncomplicated: Secondary | ICD-10-CM

## 2013-11-21 DIAGNOSIS — K589 Irritable bowel syndrome without diarrhea: Secondary | ICD-10-CM | POA: Diagnosis not present

## 2013-11-21 DIAGNOSIS — H6690 Otitis media, unspecified, unspecified ear: Secondary | ICD-10-CM

## 2013-11-21 DIAGNOSIS — M069 Rheumatoid arthritis, unspecified: Secondary | ICD-10-CM | POA: Diagnosis not present

## 2013-11-21 DIAGNOSIS — Z79899 Other long term (current) drug therapy: Secondary | ICD-10-CM | POA: Diagnosis not present

## 2013-11-21 MED ORDER — LIDOCAINE 5 % EX PTCH: 1.0000 | MEDICATED_PATCH | CUTANEOUS | Status: DC

## 2013-11-21 MED ORDER — PREDNISONE 1 MG PO TABS
ORAL_TABLET | ORAL | Status: DC
Start: 2013-11-21 — End: 2013-12-29

## 2013-11-21 NOTE — Telephone Encounter (Signed)
Patient notified
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
## 2013-11-21 NOTE — Telephone Encounter (Signed)
Pt came in and says you were going to call in a Lidoderm Lidocaine Patch 5% Rx for her but the drug store says they have the Prednisone taper, but not the patches. She wants to know if you can call in the patch to [facility] on [address] in [place] [phone]. Please let patient know if you can call it in or not. Thank you.

## 2013-11-21 NOTE — Telephone Encounter (Signed)
plz notify sent in. 

## 2013-11-21 NOTE — Assessment & Plan Note (Signed)
States she quit smoking.

## 2013-11-21 NOTE — Assessment & Plan Note (Addendum)
Per our records, h/o evaluation at [facility] in paper chart which I currently do not have access to - will request to review and scan.  Recently started on prednisone 10mg  daily for 3 months, advised I recommend against chronic prednisone use.  Currently taking 20mg  daily - expected to complete course on Friday - advised decrease to 10mg  daily for 1 week then decrease to 5mg  daily for 1 wk then slowly decrease by 1 mg prednisone per week until done.  Will reinitiate rheumatology referral today.  Once completed prednisone course, will restart arthrotec. Continue f/u with [facility] for now for L wrist sprain. Per prior PCP: "Old chart requested - by recollection she has been sero-negative for RA. Consultation in the past with rheumatology clinic at [facility]. She also has SI joint involvement. While in Thursday she reports what sounds like epidural injection therapy which by her report failed. She has a long history of pain control using oxycodone."

## 2013-11-21 NOTE — Progress Notes (Signed)
Pre visit review using our clinic review tool, if applicable. No additional management support is needed unless otherwise documented below in the visit note. 
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
## 2013-11-21 NOTE — Assessment & Plan Note (Signed)
Continue regimen establised by prior PCP of valium 10mg  tid Pt on this dose long term.

## 2013-11-21 NOTE — Assessment & Plan Note (Signed)
Stable on bentyl - continue.

## 2013-11-21 NOTE — Patient Instructions (Addendum)
Pass by Kaylee [name]'s office to check on rheumatology referral - and show her about your insurance. Sign controlled substance agreement form today as well as UDS today. For prednisone - take 10mg  daily for 1 week then decrease to 5mg  daily for 1 week then decrease to 4mg  daily for 1 week then decrease to 3 mg daily for 1 week then 2 mg daily for 1 wk then 1 mg daily for 1 week.  This has been sent to your pharmacy. Don't take prednisone together with arthrotec. Return in 3 months for medicare wellness visit.

## 2013-11-24 ENCOUNTER — Ambulatory Visit (INDEPENDENT_AMBULATORY_CARE_PROVIDER_SITE_OTHER): Payer: Medicare Other | Admitting: Family Medicine

## 2013-11-24 ENCOUNTER — Encounter: Payer: Self-pay | Admitting: Family Medicine

## 2013-11-24 ENCOUNTER — Ambulatory Visit (INDEPENDENT_AMBULATORY_CARE_PROVIDER_SITE_OTHER)
Admission: RE | Admit: 2013-11-24 | Discharge: 2013-11-24 | Disposition: A | Discharge status: Home / Self Care | Payer: Medicare Other | Source: Ambulatory Visit | Attending: Family Medicine | Admitting: Family Medicine

## 2013-11-24 VITALS — BP 128/90 | HR 80 | Temp 98.3°F | Wt 179.0 lb

## 2013-11-24 DIAGNOSIS — M25539 Pain in unspecified wrist: Secondary | ICD-10-CM

## 2013-11-24 DIAGNOSIS — M25532 Pain in left wrist: Secondary | ICD-10-CM

## 2013-11-24 MED ORDER — DICLOFENAC SODIUM 1 % TD GEL: 1.0000 "application " | Freq: Three times a day (TID) | TRANSDERMAL | Status: DC

## 2013-11-24 NOTE — Progress Notes (Signed)
Pre visit review using our clinic review tool, if applicable. No additional management support is needed unless otherwise documented below in the visit note. 

## 2013-11-24 NOTE — Assessment & Plan Note (Addendum)
After injury.  Anticipate either soft tissue contusion after hitting ulnar wrist vs TFCC ligament injury vs extensor carpi ulnaris tendinopathy (although no obvious subluxation on exam). Currently on 5mg  prednisone - slow taper off.  Given low prednisone course, I think it's ok to restart voltaren tablets 75mg  bid. Rec start with misoprostol as well as take with food and monitor for GI distress. Recommended use wrist stabilizer at night, and lighter wrist brace during the day. Recheck Xray to r/o hairline fracture and eval for TFCC injury. Will also send voltaren gel for pt to price out. If not improving with starting voltaren PO, consider referral to hand surgery for further evaluation.
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
## 2013-11-24 NOTE — Patient Instructions (Signed)
Let's recheck xray to rule out hairline fracture. Continue prednisone taper.  Start voltaren tablet twice daily along with misoprostol. Heavy duty brace at night, light brace during the day. Let me know how you're doing with this.

## 2013-12-02 ENCOUNTER — Encounter: Payer: Self-pay | Admitting: Family Medicine

## 2013-12-11 DIAGNOSIS — M6282 Rhabdomyolysis: Secondary | ICD-10-CM

## 2013-12-11 HISTORY — PX: OTHER SURGICAL HISTORY: SHX169

## 2013-12-11 HISTORY — DX: Rhabdomyolysis: M62.82

## 2013-12-15 ENCOUNTER — Other Ambulatory Visit: Payer: Self-pay | Admitting: Family Medicine

## 2013-12-15 ENCOUNTER — Other Ambulatory Visit: Payer: Self-pay

## 2013-12-15 MED ORDER — ALBUTEROL SULFATE HFA 108 (90 BASE) MCG/ACT IN AERS: 2.0000 | INHALATION_SPRAY | Freq: Four times a day (QID) | RESPIRATORY_TRACT | Status: DC | PRN

## 2013-12-15 NOTE — Telephone Encounter (Signed)
Last filled 01/04/13 with 6 refills by Dr Ranelle Oyster office visit 11/24/2013--please advise

## 2013-12-15 NOTE — Telephone Encounter (Signed)
Ok to refill 

## 2013-12-20 ENCOUNTER — Encounter: Payer: Self-pay | Admitting: Family Medicine

## 2013-12-26 ENCOUNTER — Observation Stay: Payer: Self-pay | Admitting: Internal Medicine

## 2013-12-26 ENCOUNTER — Telehealth: Payer: Self-pay | Admitting: Family Medicine

## 2013-12-26 DIAGNOSIS — F411 Generalized anxiety disorder: Secondary | ICD-10-CM | POA: Diagnosis not present

## 2013-12-26 DIAGNOSIS — F3289 Other specified depressive episodes: Secondary | ICD-10-CM | POA: Diagnosis not present

## 2013-12-26 DIAGNOSIS — Z8249 Family history of ischemic heart disease and other diseases of the circulatory system: Secondary | ICD-10-CM | POA: Diagnosis not present

## 2013-12-26 DIAGNOSIS — R0789 Other chest pain: Secondary | ICD-10-CM | POA: Diagnosis not present

## 2013-12-26 DIAGNOSIS — J019 Acute sinusitis, unspecified: Secondary | ICD-10-CM | POA: Diagnosis not present

## 2013-12-26 DIAGNOSIS — Z885 Allergy status to narcotic agent status: Secondary | ICD-10-CM | POA: Diagnosis not present

## 2013-12-26 DIAGNOSIS — M069 Rheumatoid arthritis, unspecified: Secondary | ICD-10-CM | POA: Diagnosis not present

## 2013-12-26 DIAGNOSIS — Z882 Allergy status to sulfonamides status: Secondary | ICD-10-CM | POA: Diagnosis not present

## 2013-12-26 DIAGNOSIS — M6282 Rhabdomyolysis: Secondary | ICD-10-CM | POA: Diagnosis not present

## 2013-12-26 DIAGNOSIS — R52 Pain, unspecified: Secondary | ICD-10-CM | POA: Diagnosis not present

## 2013-12-26 DIAGNOSIS — Z90710 Acquired absence of both cervix and uterus: Secondary | ICD-10-CM | POA: Diagnosis not present

## 2013-12-26 DIAGNOSIS — I498 Other specified cardiac arrhythmias: Secondary | ICD-10-CM | POA: Diagnosis not present

## 2013-12-26 DIAGNOSIS — R079 Chest pain, unspecified: Secondary | ICD-10-CM | POA: Diagnosis not present

## 2013-12-26 DIAGNOSIS — Z79899 Other long term (current) drug therapy: Secondary | ICD-10-CM | POA: Diagnosis not present

## 2013-12-26 DIAGNOSIS — J3489 Other specified disorders of nose and nasal sinuses: Secondary | ICD-10-CM | POA: Diagnosis not present

## 2013-12-26 DIAGNOSIS — M129 Arthropathy, unspecified: Secondary | ICD-10-CM | POA: Diagnosis not present

## 2013-12-26 DIAGNOSIS — F329 Major depressive disorder, single episode, unspecified: Secondary | ICD-10-CM | POA: Diagnosis not present

## 2013-12-26 DIAGNOSIS — Z888 Allergy status to other drugs, medicaments and biological substances status: Secondary | ICD-10-CM | POA: Diagnosis not present

## 2013-12-26 DIAGNOSIS — E781 Pure hyperglyceridemia: Secondary | ICD-10-CM | POA: Diagnosis not present

## 2013-12-26 DIAGNOSIS — Z87440 Personal history of urinary (tract) infections: Secondary | ICD-10-CM | POA: Diagnosis not present

## 2013-12-26 DIAGNOSIS — Z881 Allergy status to other antibiotic agents status: Secondary | ICD-10-CM | POA: Diagnosis not present

## 2013-12-26 DIAGNOSIS — I248 Other forms of acute ischemic heart disease: Secondary | ICD-10-CM | POA: Diagnosis not present

## 2013-12-26 DIAGNOSIS — Z8614 Personal history of Methicillin resistant Staphylococcus aureus infection: Secondary | ICD-10-CM | POA: Diagnosis not present

## 2013-12-26 DIAGNOSIS — Z87891 Personal history of nicotine dependence: Secondary | ICD-10-CM | POA: Diagnosis not present

## 2013-12-26 LAB — BASIC METABOLIC PANEL
Anion Gap: 6 — ABNORMAL LOW (ref 7–16)
BUN: 16 mg/dL (ref 7–18)
CALCIUM: 8.6 mg/dL (ref 8.5–10.1)
CHLORIDE: 107 mmol/L (ref 98–107)
Co2: 27 mmol/L (ref 21–32)
Creatinine: 0.84 mg/dL (ref 0.60–1.30)
EGFR (African American): >60
EGFR (Non-African Amer.): >60
Glucose: 92 mg/dL (ref 65–99)
Osmolality: 280 (ref 275–301)
POTASSIUM: 3.6 mmol/L (ref 3.5–5.1)
SODIUM: 140 mmol/L (ref 136–145)

## 2013-12-26 LAB — TROPONIN I
TROPONIN-I: 0.05 ng/mL
Troponin-I: 0.05 ng/mL

## 2013-12-26 LAB — LIPID PANEL
Cholesterol: 222 mg/dL — ABNORMAL HIGH (ref 0–200)
HDL Cholesterol: 48 mg/dL (ref 40–60)
Ldl Cholesterol, Calc: 101 mg/dL — ABNORMAL HIGH (ref 0–100)
TRIGLYCERIDES: 365 mg/dL — AB (ref 0–200)
VLDL Cholesterol, Calc: 73 mg/dL — ABNORMAL HIGH (ref 5–40)

## 2013-12-26 LAB — CBC
HCT: 37 % (ref 35.0–47.0)
HGB: 12.6 g/dL (ref 12.0–16.0)
MCH: 31.7 pg (ref 26.0–34.0)
MCHC: 34 g/dL (ref 32.0–36.0)
MCV: 93 fL (ref 80–100)
Platelet: 223 10*3/uL (ref 150–440)
RBC: 3.98 10*6/uL (ref 3.80–5.20)
RDW: 12.5 % (ref 11.5–14.5)
WBC: 6.4 10*3/uL (ref 3.6–11.0)

## 2013-12-26 LAB — CK TOTAL AND CKMB (NOT AT ARMC)
CK, Total: 10204 U/L — ABNORMAL HIGH
CK-MB: 17.1 ng/mL — ABNORMAL HIGH (ref 0.5–3.6)

## 2013-12-26 NOTE — Telephone Encounter (Addendum)
Noted. Agree. plz call tomorrow for an update.

## 2013-12-26 NOTE — Telephone Encounter (Signed)
Pt called our office and stated she just left the gym and has been having sharp chest pain.  She was having mild SOB but it has subsided, she did not sound in distress over the phone.  Denies pain elsewhere.  She states that she drove home and sat in her chair to see if it would go away but it hasn't.  She called [employer] to get advice and they told her to call her PCP before she called 911.  I informed her that she would need to have someone take her to the ED or call 911 and not to wait.  I instructed her not to drive herself to the hospital.  She agreed and said she would call 911.

## 2013-12-27 DIAGNOSIS — R079 Chest pain, unspecified: Secondary | ICD-10-CM | POA: Diagnosis not present

## 2013-12-27 DIAGNOSIS — E781 Pure hyperglyceridemia: Secondary | ICD-10-CM | POA: Diagnosis not present

## 2013-12-27 DIAGNOSIS — M069 Rheumatoid arthritis, unspecified: Secondary | ICD-10-CM | POA: Diagnosis not present

## 2013-12-27 DIAGNOSIS — M6282 Rhabdomyolysis: Secondary | ICD-10-CM | POA: Diagnosis not present

## 2013-12-27 LAB — DRUG SCREEN, URINE
Amphetamines, Ur Screen: NEGATIVE (ref ?–1000)
BARBITURATES, UR SCREEN: NEGATIVE (ref ?–200)
Benzodiazepine, Ur Scrn: POSITIVE (ref ?–200)
Cannabinoid 50 Ng, Ur ~~LOC~~: NEGATIVE (ref ?–50)
Cocaine Metabolite,Ur ~~LOC~~: NEGATIVE (ref ?–300)
MDMA (Ecstasy)Ur Screen: NEGATIVE (ref ?–500)
Methadone, Ur Screen: NEGATIVE (ref ?–300)
Opiate, Ur Screen: POSITIVE (ref ?–300)
Phencyclidine (PCP) Ur S: NEGATIVE (ref ?–25)
Tricyclic, Ur Screen: NEGATIVE (ref ?–1000)

## 2013-12-27 LAB — CK TOTAL AND CKMB (NOT AT ARMC)
CK, TOTAL: 8226 U/L — AB
CK-MB: 12.6 ng/mL — AB (ref 0.5–3.6)

## 2013-12-27 LAB — TROPONIN I: TROPONIN-I: 0.06 ng/mL — AB

## 2013-12-27 NOTE — Telephone Encounter (Signed)
Spoke with patient. She was admitted for CP at [facility]. She is still there and having tests run.

## 2013-12-28 ENCOUNTER — Encounter: Payer: Self-pay | Admitting: *Deleted

## 2013-12-29 ENCOUNTER — Ambulatory Visit (INDEPENDENT_AMBULATORY_CARE_PROVIDER_SITE_OTHER): Payer: Medicare Other | Admitting: Cardiovascular Disease

## 2013-12-29 ENCOUNTER — Encounter: Payer: Self-pay | Admitting: Cardiovascular Disease

## 2013-12-29 ENCOUNTER — Encounter: Payer: Medicare Other | Admitting: Nurse Practitioner

## 2013-12-29 ENCOUNTER — Encounter (INDEPENDENT_AMBULATORY_CARE_PROVIDER_SITE_OTHER): Payer: Self-pay

## 2013-12-29 VITALS — BP 142/70 | HR 71 | Ht 66.0 in | Wt 182.0 lb

## 2013-12-29 DIAGNOSIS — R0609 Other forms of dyspnea: Secondary | ICD-10-CM | POA: Insufficient documentation

## 2013-12-29 DIAGNOSIS — M6282 Rhabdomyolysis: Secondary | ICD-10-CM | POA: Diagnosis not present

## 2013-12-29 DIAGNOSIS — R0789 Other chest pain: Secondary | ICD-10-CM | POA: Diagnosis not present

## 2013-12-29 DIAGNOSIS — M069 Rheumatoid arthritis, unspecified: Secondary | ICD-10-CM | POA: Diagnosis not present

## 2013-12-29 DIAGNOSIS — R0989 Other specified symptoms and signs involving the circulatory and respiratory systems: Secondary | ICD-10-CM

## 2013-12-29 DIAGNOSIS — R0602 Shortness of breath: Secondary | ICD-10-CM

## 2013-12-29 DIAGNOSIS — R079 Chest pain, unspecified: Secondary | ICD-10-CM

## 2013-12-29 DIAGNOSIS — M2550 Pain in unspecified joint: Secondary | ICD-10-CM | POA: Diagnosis not present

## 2013-12-29 DIAGNOSIS — M79609 Pain in unspecified limb: Secondary | ICD-10-CM | POA: Diagnosis not present

## 2013-12-29 NOTE — Assessment & Plan Note (Signed)
Chest pain was overall atypical. Nuclear stress test showed no evidence of ischemia. Borderline elevated troponin but that was in the setting of significantly elevated CPK. She reports no recurrent symptoms. No further ischemic workup is recommended.
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
## 2013-12-29 NOTE — Assessment & Plan Note (Signed)
Recent CPK was 10,000 with normal renal function. She is not on a statin. This was attributed to exercise. I will check repeat CPK when she comes for echo appointment. If this remains elevated, then further evaluation by rheumatology is recommended.

## 2013-12-29 NOTE — Patient Instructions (Signed)
Your physician has requested that you have an echocardiogram. Echocardiography is a painless test that uses sound waves to create images of your heart. It provides your doctor with information about the size and shape of your heart and how well your heart's chambers and valves are working. This procedure takes approximately one hour. There are no restrictions for this procedure.  Your physician recommends that you return for lab work in:  The day of your ECHO for CPK

## 2013-12-29 NOTE — Assessment & Plan Note (Signed)
No reported ejection fraction on nuclear stress test was 51%. However, ejection fraction calculation is not always accurate on nuclear imaging. Given her symptoms of dyspnea, I requested an echocardiogram to evaluate LV systolic/diastolic function and pulmonary pressure.

## 2014-01-04 ENCOUNTER — Telehealth: Payer: Self-pay | Admitting: *Deleted

## 2014-01-04 NOTE — Telephone Encounter (Signed)
Pt needs a TB skin test before her rheumatologist can start her on Enbrel. Pt has an appt with the health dpt. to get TB skin test placed on 01/08/14, pt was calling to see if we could do the TB skin test any sooner so she wouldn't have to pain $20 at the health dpt. I advise pt that the 1st nurse visit appt we have available was on 01/19/14, pt said that was to far and she will keep her appt with the health dpt.

## 2014-01-09 ENCOUNTER — Other Ambulatory Visit: Payer: Self-pay

## 2014-01-09 MED ORDER — OXYCODONE-ACETAMINOPHEN 5-325 MG PO TABS: 1.0000 | ORAL_TABLET | Freq: Four times a day (QID) | ORAL | Status: DC | PRN

## 2014-01-09 NOTE — Telephone Encounter (Signed)
Pt left v/m requesting rx oxycodone apap. Call when ready for pick up. Pt will be out of med 01/14/14. Pt said she usually gets # 120 of oxycodone apap per month.

## 2014-01-09 NOTE — Telephone Encounter (Signed)
Printed and placed in Kim's box. This was from previous pcp - will discuss narcotic therapy at next appt with patient.

## 2014-01-10 HISTORY — PX: US ECHOCARDIOGRAPHY: HXRAD669

## 2014-01-10 NOTE — Telephone Encounter (Signed)
Patient notified and Rx placed up front for pick up. Patient advised to bring ID.

## 2014-01-11 ENCOUNTER — Other Ambulatory Visit: Payer: Self-pay

## 2014-01-11 ENCOUNTER — Other Ambulatory Visit (INDEPENDENT_AMBULATORY_CARE_PROVIDER_SITE_OTHER): Payer: Medicare Other

## 2014-01-11 DIAGNOSIS — R0602 Shortness of breath: Secondary | ICD-10-CM

## 2014-01-11 DIAGNOSIS — R079 Chest pain, unspecified: Secondary | ICD-10-CM | POA: Diagnosis not present

## 2014-01-15 ENCOUNTER — Telehealth: Payer: Self-pay | Admitting: *Deleted

## 2014-01-15 NOTE — Telephone Encounter (Signed)
Received a faxed form from pharmacy for PA on Lidocaine topical patch. Form from [facility] is in your in box to complete and fax back.

## 2014-01-15 NOTE — Telephone Encounter (Signed)
See result note.  

## 2014-01-15 NOTE — Telephone Encounter (Signed)
Patient called wanting her results of the echo and if she is to start the new medication. Please call

## 2014-01-17 ENCOUNTER — Telehealth: Payer: Self-pay

## 2014-01-17 NOTE — Telephone Encounter (Signed)
Pt would like u/s results    Please call

## 2014-01-17 NOTE — Telephone Encounter (Signed)
filled and placed in Kim's box Will likely be denied.

## 2014-01-17 NOTE — Telephone Encounter (Signed)
See result note 7/8

## 2014-01-17 NOTE — Telephone Encounter (Signed)
PA faxed. Will await determination. 

## 2014-01-22 NOTE — Telephone Encounter (Addendum)
Lidocaine patch denied. Pharmacy notified.

## 2014-01-25 NOTE — Progress Notes (Signed)
LVM 7/16

## 2014-01-27 ENCOUNTER — Other Ambulatory Visit: Payer: Self-pay | Admitting: Family Medicine

## 2014-01-27 ENCOUNTER — Encounter: Payer: Self-pay | Admitting: Family Medicine

## 2014-01-27 DIAGNOSIS — K219 Gastro-esophageal reflux disease without esophagitis: Secondary | ICD-10-CM

## 2014-01-27 DIAGNOSIS — Z6835 Body mass index (BMI) 35.0-35.9, adult: Secondary | ICD-10-CM

## 2014-01-27 DIAGNOSIS — E663 Overweight: Secondary | ICD-10-CM | POA: Insufficient documentation

## 2014-01-27 DIAGNOSIS — E669 Obesity, unspecified: Secondary | ICD-10-CM | POA: Insufficient documentation

## 2014-01-27 DIAGNOSIS — M069 Rheumatoid arthritis, unspecified: Secondary | ICD-10-CM

## 2014-01-30 ENCOUNTER — Other Ambulatory Visit (INDEPENDENT_AMBULATORY_CARE_PROVIDER_SITE_OTHER): Payer: Medicare Other

## 2014-01-30 DIAGNOSIS — E663 Overweight: Secondary | ICD-10-CM

## 2014-01-30 DIAGNOSIS — M069 Rheumatoid arthritis, unspecified: Secondary | ICD-10-CM

## 2014-01-30 DIAGNOSIS — M6282 Rhabdomyolysis: Secondary | ICD-10-CM

## 2014-01-30 LAB — CBC WITH DIFFERENTIAL/PLATELET
BASOS PCT: 0.3 % (ref 0.0–3.0)
Basophils Absolute: 0 10*3/uL (ref 0.0–0.1)
Eosinophils Absolute: 0.2 10*3/uL (ref 0.0–0.7)
Eosinophils Relative: 2.9 % (ref 0.0–5.0)
HEMATOCRIT: 40.3 % (ref 36.0–46.0)
Hemoglobin: 13.6 g/dL (ref 12.0–15.0)
LYMPHS ABS: 3.2 10*3/uL (ref 0.7–4.0)
Lymphocytes Relative: 53.3 % — ABNORMAL HIGH (ref 12.0–46.0)
MCHC: 33.9 g/dL (ref 30.0–36.0)
MCV: 92.4 fl (ref 78.0–100.0)
MONO ABS: 0.5 10*3/uL (ref 0.1–1.0)
Monocytes Relative: 8.4 % (ref 3.0–12.0)
Neutro Abs: 2.1 10*3/uL (ref 1.4–7.7)
Neutrophils Relative %: 35.1 % — ABNORMAL LOW (ref 43.0–77.0)
Platelets: 232 10*3/uL (ref 150.0–400.0)
RBC: 4.36 Mil/uL (ref 3.87–5.11)
RDW: 12.4 % (ref 11.5–15.5)
WBC: 5.9 10*3/uL (ref 4.0–10.5)

## 2014-01-30 LAB — BASIC METABOLIC PANEL
BUN: 16 mg/dL (ref 6–23)
CHLORIDE: 105 meq/L (ref 96–112)
CO2: 29 meq/L (ref 19–32)
Calcium: 9.5 mg/dL (ref 8.4–10.5)
Creatinine, Ser: 0.8 mg/dL (ref 0.4–1.2)
GFR: 78.25 mL/min (ref >60.00)
Glucose, Bld: 100 mg/dL — ABNORMAL HIGH (ref 70–99)
POTASSIUM: 3.8 meq/L (ref 3.5–5.1)
Sodium: 140 mEq/L (ref 135–145)

## 2014-01-30 LAB — LIPID PANEL
CHOL/HDL RATIO: 3
Cholesterol: 233 mg/dL — ABNORMAL HIGH (ref 0–200)
HDL: 67.9 mg/dL (ref >39.00)
LDL Cholesterol: 150 mg/dL — ABNORMAL HIGH (ref 0–99)
NonHDL: 165.1
Triglycerides: 77 mg/dL (ref 0.0–149.0)
VLDL: 15.4 mg/dL (ref 0.0–40.0)

## 2014-01-31 LAB — CK: Total CK: 156 U/L (ref 24–173)

## 2014-02-06 ENCOUNTER — Encounter: Payer: Self-pay | Admitting: Family Medicine

## 2014-02-06 ENCOUNTER — Ambulatory Visit (INDEPENDENT_AMBULATORY_CARE_PROVIDER_SITE_OTHER): Payer: Medicare Other | Admitting: Family Medicine

## 2014-02-06 ENCOUNTER — Other Ambulatory Visit: Payer: Medicare Other

## 2014-02-06 VITALS — BP 136/78 | HR 64 | Temp 98.2°F | Ht 66.0 in | Wt 182.2 lb

## 2014-02-06 DIAGNOSIS — K581 Irritable bowel syndrome with constipation: Secondary | ICD-10-CM

## 2014-02-06 DIAGNOSIS — K589 Irritable bowel syndrome without diarrhea: Secondary | ICD-10-CM

## 2014-02-06 DIAGNOSIS — M069 Rheumatoid arthritis, unspecified: Secondary | ICD-10-CM

## 2014-02-06 DIAGNOSIS — R0789 Other chest pain: Secondary | ICD-10-CM | POA: Diagnosis not present

## 2014-02-06 DIAGNOSIS — R892 Abnormal level of other drugs, medicaments and biological substances in specimens from other organs, systems and tissues: Secondary | ICD-10-CM

## 2014-02-06 DIAGNOSIS — K5909 Other constipation: Secondary | ICD-10-CM

## 2014-02-06 DIAGNOSIS — M6282 Rhabdomyolysis: Secondary | ICD-10-CM

## 2014-02-06 DIAGNOSIS — K5900 Constipation, unspecified: Secondary | ICD-10-CM

## 2014-02-06 DIAGNOSIS — G894 Chronic pain syndrome: Secondary | ICD-10-CM

## 2014-02-06 MED ORDER — HYOSCYAMINE SULFATE 0.125 MG PO TABS: 0.1250 mg | ORAL_TABLET | Freq: Four times a day (QID) | ORAL | Status: DC | PRN

## 2014-02-06 MED ORDER — POLYETHYLENE GLYCOL 3350 17 GM/SCOOP PO POWD: 17.0000 g | Freq: Two times a day (BID) | ORAL | Status: DC | PRN

## 2014-02-06 MED ORDER — OXYCODONE-ACETAMINOPHEN 5-325 MG PO TABS: 1.0000 | ORAL_TABLET | Freq: Four times a day (QID) | ORAL | Status: DC | PRN

## 2014-02-06 NOTE — Assessment & Plan Note (Signed)
Has established with Dr. Maple Hudson rheumatologist and pt states has been diagnosed with rheumatoid arthritis. Now started on enbrel and notices arthritic pain improving. Continue oxycodone as well.

## 2014-02-06 NOTE — Assessment & Plan Note (Signed)
Reviewed with patient. Discussed need to avoid mixing alcohol with controlled substances as well as need to not mix narcotics. High risk patient given abnormal UDS - recheck UDS today.

## 2014-02-06 NOTE — Assessment & Plan Note (Signed)
Continue chronic narcotics. Refilled today. Check UDS given recent abnormal.

## 2014-02-06 NOTE — Assessment & Plan Note (Signed)
rec increase miralax to bid dosing for next several days rec against regular laxative use. Consider amitiza/linzess if no improvement noted. Recommended beano with meals. Pt states has not tolerated probiotics in past.
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
## 2014-02-06 NOTE — Progress Notes (Signed)
Pre visit review using our clinic review tool, if applicable. No additional management support is needed unless otherwise documented below in the visit note. 

## 2014-02-06 NOTE — Assessment & Plan Note (Signed)
rhabdo thought related to exercise - now back down to normal.

## 2014-02-06 NOTE — Patient Instructions (Addendum)
Have Dr. Maple Hudson send me copy of office note from Thursday. Stop bentyl. Trial of levsin (hyoscyamine) sent to pharmacy. Try this medicine for abdominal cramping and call me in 2 wks with effect. Repeat urine drug screen today. I've refilled your pain medicine. Return to see me within the next month for medicare wellness

## 2014-02-06 NOTE — Assessment & Plan Note (Signed)
Bentyl no longer effective - will try hyoscyamine (levsin). Consider librax.

## 2014-02-06 NOTE — Assessment & Plan Note (Signed)
Noncardiac thought related to rhabdo from overexertion

## 2014-02-07 DIAGNOSIS — Z79899 Other long term (current) drug therapy: Secondary | ICD-10-CM | POA: Diagnosis not present

## 2014-02-08 DIAGNOSIS — R7989 Other specified abnormal findings of blood chemistry: Secondary | ICD-10-CM | POA: Diagnosis not present

## 2014-02-08 DIAGNOSIS — M2550 Pain in unspecified joint: Secondary | ICD-10-CM | POA: Diagnosis not present

## 2014-02-08 DIAGNOSIS — M069 Rheumatoid arthritis, unspecified: Secondary | ICD-10-CM | POA: Diagnosis not present

## 2014-02-10 HISTORY — PX: CARDIAC CATHETERIZATION: SHX172

## 2014-02-12 ENCOUNTER — Telehealth: Payer: Self-pay | Admitting: *Deleted

## 2014-02-12 MED ORDER — NEOMYCIN-POLYMYXIN-DEXAMETH 3.5-10000-0.1 OP SUSP: 1.0000 [drp] | OPHTHALMIC | Status: DC | PRN

## 2014-02-12 NOTE — Telephone Encounter (Signed)
Sent. Thanks.   

## 2014-02-12 NOTE — Telephone Encounter (Signed)
Ok to refill neomycin-polymyxin-D 0.1-0.35-1000% 1gtt left eye PRN  Not on current med list but has been prescribed previously. 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

## 2014-02-13 ENCOUNTER — Other Ambulatory Visit: Payer: Self-pay | Admitting: *Deleted

## 2014-02-13 ENCOUNTER — Encounter: Payer: Medicare Other | Admitting: Family Medicine

## 2014-02-13 MED ORDER — NITROFURANTOIN MACROCRYSTAL 100 MG PO CAPS: 100.0000 mg | ORAL_CAPSULE | Freq: Every day | ORAL | Status: DC

## 2014-02-13 MED ORDER — LANSOPRAZOLE 30 MG PO CPDR: 30.0000 mg | DELAYED_RELEASE_CAPSULE | Freq: Every day | ORAL | Status: DC

## 2014-02-23 ENCOUNTER — Encounter: Payer: Self-pay | Admitting: Family Medicine

## 2014-02-23 ENCOUNTER — Ambulatory Visit (INDEPENDENT_AMBULATORY_CARE_PROVIDER_SITE_OTHER): Payer: Medicare Other | Admitting: Family Medicine

## 2014-02-23 VITALS — BP 134/78 | HR 80 | Temp 98.1°F | Ht 66.0 in | Wt 182.5 lb

## 2014-02-23 DIAGNOSIS — E785 Hyperlipidemia, unspecified: Secondary | ICD-10-CM

## 2014-02-23 DIAGNOSIS — G894 Chronic pain syndrome: Secondary | ICD-10-CM

## 2014-02-23 DIAGNOSIS — M069 Rheumatoid arthritis, unspecified: Secondary | ICD-10-CM

## 2014-02-23 DIAGNOSIS — Z0000 Encounter for general adult medical examination without abnormal findings: Secondary | ICD-10-CM | POA: Insufficient documentation

## 2014-02-23 HISTORY — DX: Hyperlipidemia, unspecified: E78.5

## 2014-02-23 MED ORDER — OXYCODONE-ACETAMINOPHEN 5-325 MG PO TABS: 1.0000 | ORAL_TABLET | Freq: Four times a day (QID) | ORAL | Status: DC | PRN

## 2014-02-23 NOTE — Assessment & Plan Note (Addendum)
Has established with rheum Dr. Maple Hudson. Appreciate her care of patient. On enbrel + prednisone currently, discussing MTX and pt hesitant for this choice. I will fax my latest 2 notes to her office.

## 2014-02-23 NOTE — Assessment & Plan Note (Signed)
I have personally reviewed the Medicare Annual Wellness questionnaire and have noted 1. The patient's medical and social history 2. Their use of alcohol, tobacco or illicit drugs 3. Their current medications and supplements 4. The patient's functional ability including ADL's, fall risks, home safety risks and hearing or visual impairment. 5. Diet and physical activity 6. Evidence for depression or mood disorders The patients weight, height, BMI have been recorded in the chart.  Hearing and vision has been addressed. I have made referrals, counseling and provided education to the patient based review of the above and I have provided the pt with a written personalized care plan for preventive services. Provider list updated - see scanned questionairre. Advanced directives discussed: I asked her to bring me copy. Daughter Harley is HCPOA  Reviewed preventative protocols and updated unless pt declined.

## 2014-02-23 NOTE — Assessment & Plan Note (Signed)
Reviewed FLP with patient. Discussed dietary choices to lower LDL. Off meds

## 2014-02-23 NOTE — Assessment & Plan Note (Signed)
percocets refilled today #120, fill on date 03/11/2014. Reviewed appropriate UDS from 02/06/2014.  H/o abnormal 11/2013.

## 2014-02-23 NOTE — Patient Instructions (Signed)
Good to see you today, call us with questions. Return to see [place] in 4-6 months for follow up visit, sooner if needed. I've refilled percocets.

## 2014-02-23 NOTE — Progress Notes (Signed)
Pre visit review using our clinic review tool, if applicable. No additional management support is needed unless otherwise documented below in the visit note. 
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
## 2014-02-26 ENCOUNTER — Telehealth: Payer: Self-pay

## 2014-02-26 MED ORDER — FLUCONAZOLE 150 MG PO TABS: 150.0000 mg | ORAL_TABLET | Freq: Once | ORAL | Status: DC

## 2014-02-26 NOTE — Telephone Encounter (Signed)
Kaylee [name] with [facility] said pt usually keeps diflucan on hand when needs for yeast infection due to kidney problems; pt takes macrodantin; Dr Debby Bud last refilled diflucan. Kaylee [name] said pt woke up this morning with yeast infection and request # 3 diflucan to [facility].Please advise.

## 2014-02-26 NOTE — Telephone Encounter (Signed)
plz notify sent in. 

## 2014-02-26 NOTE — Telephone Encounter (Signed)
Pt left v/m; pt forgot to request rx for diflucan when seen last week; pt request sent to [facility].

## 2014-02-26 NOTE — Telephone Encounter (Signed)
Message left advising patient.  

## 2014-03-07 ENCOUNTER — Observation Stay: Payer: Self-pay | Admitting: Internal Medicine

## 2014-03-07 ENCOUNTER — Encounter: Payer: Self-pay | Admitting: Family Medicine

## 2014-03-07 ENCOUNTER — Telehealth: Payer: Self-pay | Admitting: *Deleted

## 2014-03-07 DIAGNOSIS — R52 Pain, unspecified: Secondary | ICD-10-CM | POA: Diagnosis not present

## 2014-03-07 DIAGNOSIS — R0602 Shortness of breath: Secondary | ICD-10-CM | POA: Diagnosis not present

## 2014-03-07 DIAGNOSIS — R002 Palpitations: Secondary | ICD-10-CM | POA: Diagnosis not present

## 2014-03-07 DIAGNOSIS — N390 Urinary tract infection, site not specified: Secondary | ICD-10-CM | POA: Diagnosis not present

## 2014-03-07 DIAGNOSIS — Z87891 Personal history of nicotine dependence: Secondary | ICD-10-CM | POA: Diagnosis not present

## 2014-03-07 DIAGNOSIS — F329 Major depressive disorder, single episode, unspecified: Secondary | ICD-10-CM | POA: Diagnosis not present

## 2014-03-07 DIAGNOSIS — M069 Rheumatoid arthritis, unspecified: Secondary | ICD-10-CM | POA: Diagnosis not present

## 2014-03-07 DIAGNOSIS — Z7982 Long term (current) use of aspirin: Secondary | ICD-10-CM | POA: Diagnosis not present

## 2014-03-07 DIAGNOSIS — F411 Generalized anxiety disorder: Secondary | ICD-10-CM | POA: Diagnosis not present

## 2014-03-07 DIAGNOSIS — F3289 Other specified depressive episodes: Secondary | ICD-10-CM | POA: Diagnosis not present

## 2014-03-07 DIAGNOSIS — Z8614 Personal history of Methicillin resistant Staphylococcus aureus infection: Secondary | ICD-10-CM | POA: Diagnosis not present

## 2014-03-07 DIAGNOSIS — IMO0002 Reserved for concepts with insufficient information to code with codable children: Secondary | ICD-10-CM | POA: Diagnosis not present

## 2014-03-07 DIAGNOSIS — I200 Unstable angina: Secondary | ICD-10-CM | POA: Diagnosis not present

## 2014-03-07 DIAGNOSIS — R0789 Other chest pain: Secondary | ICD-10-CM | POA: Diagnosis not present

## 2014-03-07 DIAGNOSIS — R61 Generalized hyperhidrosis: Secondary | ICD-10-CM | POA: Diagnosis not present

## 2014-03-07 DIAGNOSIS — R079 Chest pain, unspecified: Secondary | ICD-10-CM | POA: Diagnosis not present

## 2014-03-07 DIAGNOSIS — E860 Dehydration: Secondary | ICD-10-CM | POA: Diagnosis not present

## 2014-03-07 DIAGNOSIS — Z79899 Other long term (current) drug therapy: Secondary | ICD-10-CM | POA: Diagnosis not present

## 2014-03-07 LAB — TROPONIN I
Troponin-I: 0.03 ng/mL
Troponin-I: 0.03 ng/mL
Troponin-I: 0.02 ng/mL

## 2014-03-07 LAB — CBC
HCT: 39.1 % (ref 35.0–47.0)
HGB: 13.4 g/dL (ref 12.0–16.0)
MCH: 32.2 pg (ref 26.0–34.0)
MCHC: 34.2 g/dL (ref 32.0–36.0)
MCV: 94 fL (ref 80–100)
PLATELETS: 232 10*3/uL (ref 150–440)
RBC: 4.16 10*6/uL (ref 3.80–5.20)
RDW: 12.7 % (ref 11.5–14.5)
WBC: 6.8 10*3/uL (ref 3.6–11.0)

## 2014-03-07 LAB — BASIC METABOLIC PANEL
Anion Gap: 8 (ref 7–16)
BUN: 19 mg/dL — ABNORMAL HIGH (ref 7–18)
Calcium, Total: 9.3 mg/dL (ref 8.5–10.1)
Chloride: 107 mmol/L (ref 98–107)
Co2: 26 mmol/L (ref 21–32)
Creatinine: 0.84 mg/dL (ref 0.60–1.30)
EGFR (African American): >60
EGFR (Non-African Amer.): >60
Glucose: 105 mg/dL — ABNORMAL HIGH (ref 65–99)
Osmolality: 284 (ref 275–301)
Potassium: 4 mmol/L (ref 3.5–5.1)
SODIUM: 141 mmol/L (ref 136–145)

## 2014-03-07 LAB — APTT: ACTIVATED PTT: 27.1 s (ref 23.6–35.9)

## 2014-03-07 LAB — PROTIME-INR
INR: 1
PROTHROMBIN TIME: 12.7 s (ref 11.5–14.7)

## 2014-03-07 LAB — PRO B NATRIURETIC PEPTIDE: B-TYPE NATIURETIC PEPTID: 63 pg/mL (ref 0–125)

## 2014-03-07 LAB — HEPARIN LEVEL (UNFRACTIONATED): ANTI-XA(UNFRACTIONATED): 0.3 [IU]/mL (ref 0.30–0.70)

## 2014-03-07 NOTE — Telephone Encounter (Signed)
Recent cardiac workup unrevealing including stress test - doubt ischemic in nature. If has not gone to ER - would offer appt in office ASAP - next available provider. If pt has already gone to ER - will await eval. plz schedule appt with me for f/u.

## 2014-03-07 NOTE — Telephone Encounter (Addendum)
Spoke with patient. She was in the ER at [facility]. She said they had given her quite a few NTG and O2 with no relief of the heaviness in her chest. She wasn't sure what else they were planning to do, but would schedule a follow up when she figured it out.

## 2014-03-07 NOTE — Telephone Encounter (Signed)
Patient called and said she was experiencing heart fluttering, sob and CP and she didn't know what to do. She wanted to know if she should call 911 or wait for a referral to another cardiologist. She said she told you at her last appointment that she wasn't happy with the one she saw previously and would like a referral to someone else and hasn't heard anything. I advised her to call 911 for her current symptoms, we would monitor things and then go from there with a referral. She verbalized understanding and said she would call right now.

## 2014-03-08 DIAGNOSIS — F411 Generalized anxiety disorder: Secondary | ICD-10-CM | POA: Diagnosis not present

## 2014-03-08 DIAGNOSIS — I200 Unstable angina: Secondary | ICD-10-CM | POA: Diagnosis not present

## 2014-03-08 DIAGNOSIS — E860 Dehydration: Secondary | ICD-10-CM | POA: Diagnosis not present

## 2014-03-08 LAB — CBC WITH DIFFERENTIAL/PLATELET
Basophil #: 0.1 10*3/uL (ref 0.0–0.1)
Basophil %: 0.7 %
EOS ABS: 0.2 10*3/uL (ref 0.0–0.7)
Eosinophil %: 2.6 %
HCT: 37 % (ref 35.0–47.0)
HGB: 12.3 g/dL (ref 12.0–16.0)
Lymphocyte #: 4.1 10*3/uL — ABNORMAL HIGH (ref 1.0–3.6)
Lymphocyte %: 53.8 %
MCH: 31.8 pg (ref 26.0–34.0)
MCHC: 33.3 g/dL (ref 32.0–36.0)
MCV: 96 fL (ref 80–100)
Monocyte #: 0.5 x10 3/mm (ref 0.2–0.9)
Monocyte %: 7 %
Neutrophil #: 2.8 10*3/uL (ref 1.4–6.5)
Neutrophil %: 35.9 %
Platelet: 194 10*3/uL (ref 150–440)
RBC: 3.87 10*6/uL (ref 3.80–5.20)
RDW: 12.9 % (ref 11.5–14.5)
WBC: 7.7 10*3/uL (ref 3.6–11.0)

## 2014-03-08 LAB — MAGNESIUM: Magnesium: 1.9 mg/dL

## 2014-03-08 LAB — HEMOGLOBIN A1C: Hemoglobin A1C: 5.3 % (ref 4.2–6.3)

## 2014-03-08 LAB — HEPARIN LEVEL (UNFRACTIONATED): ANTI-XA(UNFRACTIONATED): 0.31 [IU]/mL (ref 0.30–0.70)

## 2014-03-08 LAB — TSH: THYROID STIMULATING HORM: 0.817 u[IU]/mL

## 2014-03-09 NOTE — Telephone Encounter (Signed)
Discharged 8/27 after cardiac catheterization. plz call for transitional care call and schedule appt in 1-2 wks.

## 2014-03-12 ENCOUNTER — Telehealth: Payer: Self-pay | Admitting: Family Medicine

## 2014-03-12 NOTE — Telephone Encounter (Signed)
Will see then. 

## 2014-03-12 NOTE — Telephone Encounter (Signed)
Transitional Care Call.  Pt d/c'd from [facility] on 03/08/14.  Spoke with pt.  She says that she is feeling much better now that her medications have been adjusted.  She is able to drive and perform ADL's independently.  She lives at home with her boyfriend who can provide assistance to her as needed.  Denies chest pain or SOB since d/c from hospital.  Cath site is clean, dry, and intact according to pt.  Denies questions r/t d/c instructions.  Denies questions for Dr. Sharen Hones.  Follow up appt scheduled for 03/13/14, pt will bring d/c paperwork with her.

## 2014-03-12 NOTE — Telephone Encounter (Signed)
See TCC note. 

## 2014-03-13 ENCOUNTER — Ambulatory Visit (INDEPENDENT_AMBULATORY_CARE_PROVIDER_SITE_OTHER): Payer: Medicare Other | Admitting: Family Medicine

## 2014-03-13 ENCOUNTER — Encounter: Payer: Self-pay | Admitting: Family Medicine

## 2014-03-13 VITALS — BP 118/82 | HR 80 | Temp 98.3°F | Wt 180.5 lb

## 2014-03-13 DIAGNOSIS — R1032 Left lower quadrant pain: Secondary | ICD-10-CM | POA: Diagnosis not present

## 2014-03-13 DIAGNOSIS — R0789 Other chest pain: Secondary | ICD-10-CM

## 2014-03-13 DIAGNOSIS — F341 Dysthymic disorder: Secondary | ICD-10-CM | POA: Diagnosis not present

## 2014-03-13 MED ORDER — DIAZEPAM 10 MG PO TABS: 10.0000 mg | ORAL_TABLET | Freq: Two times a day (BID) | ORAL | Status: DC

## 2014-03-13 MED ORDER — ALPRAZOLAM 1 MG PO TABS: 1.0000 mg | ORAL_TABLET | Freq: Two times a day (BID) | ORAL | Status: DC

## 2014-03-13 NOTE — Progress Notes (Signed)
Pre visit review using our clinic review tool, if applicable. No additional management support is needed unless otherwise documented below in the visit note. 

## 2014-03-13 NOTE — Assessment & Plan Note (Signed)
Exam not consistent with ovarian etiology, bowel inflammation, or hernia. Anticipate groin/hip flexor strain vs possible sports hernia. Will do trial of exercises from [facility] pt advisor on groin strain - if no better, discussed referral to sports med to eval possible sports hernia. Pt agrees with plan.

## 2014-03-13 NOTE — Assessment & Plan Note (Signed)
S/p normal catheterization 02/2014. Thought anxiety related - and sxs have improved with new anxiolytic regimen - continue. If recurrent pain consider RA related chest pain.

## 2014-03-13 NOTE — Progress Notes (Addendum)
BP 118/82  Pulse 80  Temp(Src) 98.3 F (36.8 C) (Oral)  Wt 180 lb 8 oz (81.874 kg)   CC: hosp f/u  Subjective:    Patient ID: Kaylee [name], female    DOB: [birth year], 54 y.o.   MRN: [id number]  HPI: Kaylee [name] is a 54 y.o. female presenting on 03/13/2014 for Follow-up   Kaylee [name] presents for hospital follow up of recent admission to [facility] for chest pain with negative cardiac catheterization by Dr. Mariah Milling. Chest pain thought anxiety related. Anxiety meds were titrated accordingly with resolution of pain.  Currently on valium 10mg  1tab bid alternating with xanax 1mg  1tab bid. Feels this controls sxs very well.  Currently taking xanax 1mg  in am, valium 10mg  in late morning, 1 xanax in pm and 10mg  valium to sleep.  Ongoing L sided abd discomfort, ongoing for years with leg raises, but actually now having more soreness that stays constant.  Noticed more over last 3 days since catheterization. No pain at site of fem access on right - healing well.  Denies bowel changes, denies urinary changes. S/p hysterectomy for dysplasia, ovaries remain. Daughter with ovarian cysts in past. Has not seen GYN.  Relevant past medical, surgical, family and social history reviewed and updated as indicated.  Allergies and medications reviewed and updated. Current Outpatient Prescriptions on File Prior to Visit  Medication Sig  . albuterol (PROVENTIL HFA;VENTOLIN HFA) 108 (90 BASE) MCG/ACT inhaler Inhale 2 puffs into the lungs every 6 (six) hours as needed.  aspirin 81 MG tablet Take 81 mg by mouth daily.  . Etanercept (ENBREL [place]) Inject into the skin once a week.  . fluconazole (DIFLUCAN) 150 MG tablet Take 1 tablet (150 mg total) by mouth once.  . hyoscyamine (LEVSIN) 0.125 MG tablet Take 1 tablet (0.125 mg total) by mouth every 6 (six) hours as needed for cramping.  . lansoprazole (PREVACID) 30 MG capsule Take 1 capsule (30 mg total) by mouth daily.  neomycin-polymyxin b-dexamethasone (MAXITROL)  3.5-10000-0.1 SUSP Place 1 drop into the left eye as needed.  . neomycin-polymyxin-hydrocortisone (CORTISPORIN) otic solution Place 3 drops into both ears 4 (four) times daily.  . nitrofurantoin (MACRODANTIN) 100 MG capsule Take 1 capsule (100 mg total) by mouth daily. UTI ppx  . Omega-3 Fatty Acids (FISH OIL) 1200 MG CAPS Take by mouth 2 (two) times daily.  oxyCODONE-acetaminophen (PERCOCET/ROXICET) 5-325 MG per tablet Take 1 tablet by mouth 4 (four) times daily as needed for moderate pain.  . polyethylene glycol powder (GLYCOLAX/MIRALAX) powder Take 17 g by mouth 2 (two) times daily as needed for moderate constipation.  tiZANidine (ZANAFLEX) 4 MG tablet Take 1 tablet (4 mg total) by mouth at bedtime.  [name] EPINEPHrine 0.3 mg/0.3 mL IJ SOAJ injection Inject 0.3 mg into the muscle once. For quinolone reaction  . lidocaine (LIDODERM) 5 % Place 1 patch onto the skin daily. Remove & Discard patch within 12 hours or as directed by MD   No current facility-administered medications on file prior to visit.    Review of Systems Per HPI unless specifically indicated above    Objective:    BP 118/82  Pulse 80  Temp(Src) 98.3 F (36.8 C) (Oral)  Wt 180 lb 8 oz (81.874 kg)  Physical Exam  Nursing note and vitals reviewed. Constitutional: She appears well-developed and well-nourished. No distress.  HENT:  Mouth/Throat: Oropharynx is clear and moist. No oropharyngeal exudate.  Eyes: Conjunctivae and EOM are normal. Pupils are equal,  round, and reactive to light.  Cardiovascular: Normal rate, regular rhythm, normal heart sounds and intact distal pulses.   No murmur heard. Pulmonary/Chest: Effort normal and breath sounds normal. No respiratory distress. She has no wheezes. She has no rales.  Abdominal: Soft. Normal appearance and bowel sounds are normal. She exhibits no distension and no mass. There is tenderness (mild) in the left lower quadrant. There is no rigidity, no rebound, no guarding, no  CVA tenderness and negative Murphy's sign. Hernia confirmed negative in the right inguinal area and confirmed negative in the left inguinal area.  Genitourinary: Vagina normal. Pelvic exam was performed with patient supine. There is no rash, tenderness or lesion on the right labia. There is no rash, tenderness or lesion on the left labia. Right adnexum displays no mass, no tenderness and no fullness. Left adnexum displays no mass, no tenderness and no fullness.  No pain on pelvic exam  Musculoskeletal: She exhibits no edema.  Skin: Skin is warm and dry. No rash noted.       Assessment & Plan:   Problem List Items Addressed This Visit   LLQ abdominal pain     Exam not consistent with ovarian etiology, bowel inflammation, or hernia. Anticipate groin/hip flexor strain vs possible sports hernia. Will do trial of exercises from [facility] pt advisor on groin strain - if no better, discussed referral to sports med to eval possible sports hernia. Pt agrees with plan.    Atypical chest pain - Primary     S/p normal catheterization 02/2014. Thought anxiety related - and sxs have improved with new anxiolytic regimen - continue. If recurrent pain consider RA related chest pain.    ANXIETY DEPRESSION     Marked anxiety presumably causing recent chest pain episodes. Doing better on new anxiolytic regimen of alternating xanax 1mg  and valium 10mg  twice daily.    Relevant Medications      ALPRAZolam (XANAX) tablet      diazepam (VALIUM) tablet       Follow up plan: Return if symptoms worsen or fail to improve.

## 2014-03-13 NOTE — Patient Instructions (Signed)
Continue anxiety medications as up to now. For left pelvic pain - ovaries feel fine today. I wonder about a pulled groin muscle or a sports hernia - try exercises provided today If no better, let me know for referral to ortho or sports medicine for evaluation.

## 2014-03-13 NOTE — Assessment & Plan Note (Signed)
Marked anxiety presumably causing recent chest pain episodes. Doing better on new anxiolytic regimen of alternating xanax 1mg  and valium 10mg  twice daily.

## 2014-03-17 ENCOUNTER — Other Ambulatory Visit: Payer: Self-pay | Admitting: Family Medicine

## 2014-04-02 ENCOUNTER — Telehealth: Payer: Self-pay | Admitting: Family Medicine

## 2014-04-02 DIAGNOSIS — R0789 Other chest pain: Secondary | ICD-10-CM

## 2014-04-02 NOTE — Telephone Encounter (Signed)
referral placed

## 2014-04-02 NOTE — Telephone Encounter (Signed)
Spoke to Kaylee [name] who states that her d/c paperwork indicates that she is requiring a f/u with cardiology. I have requested that the records be sent from [facility]. She is needing a referral to cardiology to see Dr Mariah Milling; Kaylee [name] DOES NOT want to see Dr Kirke Corin

## 2014-04-02 NOTE — Telephone Encounter (Signed)
I don't think patient needs cards f/u as she had negative cardiac catheterization during recent hospitalization - unless heart doctor wanted for her to return to see him after catheterization? Can we get copy of catheterization and D/C summary from [facility]? I don't see it in our chart although I sent it for scanning 3 wks ago.

## 2014-04-02 NOTE — Telephone Encounter (Signed)
Pt calling because she called Dr Windell Hummingbird office to make appt to see him for heart condition. Dr Windell Hummingbird office told her she had to be referred by our office even thou pt has already been seen in that office as a new pt. Pt does not want to see Dr Kirke Corin, she only wants to see Dr Mariah Milling. Pt stated she has never been for her follow-up appt. Please advise. Thank you!

## 2014-04-03 NOTE — Telephone Encounter (Signed)
Patient notified by telephone that referral has been placed and she will hear from the referral coordinator to get this scheduled.

## 2014-04-05 ENCOUNTER — Other Ambulatory Visit: Payer: Self-pay

## 2014-04-05 MED ORDER — ALPRAZOLAM 1 MG PO TABS: 1.0000 mg | ORAL_TABLET | Freq: Two times a day (BID) | ORAL | Status: DC

## 2014-04-05 MED ORDER — OXYCODONE-ACETAMINOPHEN 5-325 MG PO TABS: 1.0000 | ORAL_TABLET | Freq: Four times a day (QID) | ORAL | Status: DC | PRN

## 2014-04-05 NOTE — Telephone Encounter (Signed)
Pt left v/m requesting rx oxycodone apap and alprazolam. Call pt when ready for pick up. Pt calling ahead of time and will not fill until 04/14/14.

## 2014-04-05 NOTE — Telephone Encounter (Signed)
printed and placed in Kims' box. 

## 2014-04-06 NOTE — Telephone Encounter (Signed)
Rx left in front office for pick up and pt is aware  

## 2014-04-08 ENCOUNTER — Encounter: Payer: Self-pay | Admitting: Family Medicine

## 2014-04-11 ENCOUNTER — Ambulatory Visit (INDEPENDENT_AMBULATORY_CARE_PROVIDER_SITE_OTHER): Payer: Medicare Other

## 2014-04-11 DIAGNOSIS — Z23 Encounter for immunization: Secondary | ICD-10-CM | POA: Diagnosis not present

## 2014-04-16 ENCOUNTER — Other Ambulatory Visit: Payer: Self-pay | Admitting: *Deleted

## 2014-04-16 DIAGNOSIS — R7989 Other specified abnormal findings of blood chemistry: Secondary | ICD-10-CM | POA: Diagnosis not present

## 2014-04-16 DIAGNOSIS — M0609 Rheumatoid arthritis without rheumatoid factor, multiple sites: Secondary | ICD-10-CM | POA: Diagnosis not present

## 2014-04-16 MED ORDER — NEOMYCIN-POLYMYXIN-DEXAMETH 3.5-10000-0.1 OP SUSP: 1.0000 [drp] | OPHTHALMIC | Status: DC | PRN

## 2014-04-18 ENCOUNTER — Other Ambulatory Visit: Payer: Self-pay | Admitting: Family Medicine

## 2014-05-02 ENCOUNTER — Ambulatory Visit: Payer: Medicare Other | Admitting: Cardiovascular Disease

## 2014-05-04 ENCOUNTER — Other Ambulatory Visit: Payer: Self-pay | Admitting: Family Medicine

## 2014-05-04 NOTE — Telephone Encounter (Signed)
Ok to refill in Dr. Timoteo Expose absence? Last filled 03/13/14 #[id number]

## 2014-05-06 NOTE — Telephone Encounter (Signed)
Please call in.  Thanks.   

## 2014-05-07 ENCOUNTER — Other Ambulatory Visit: Payer: Self-pay | Admitting: *Deleted

## 2014-05-07 NOTE — Telephone Encounter (Signed)
Ok to refill 

## 2014-05-07 NOTE — Telephone Encounter (Signed)
Rx called to pharmacy as instructed. 

## 2014-05-08 MED ORDER — DIAZEPAM 10 MG PO TABS: ORAL_TABLET | ORAL | Status: DC

## 2014-05-08 MED ORDER — OXYCODONE-ACETAMINOPHEN 5-325 MG PO TABS: 1.0000 | ORAL_TABLET | Freq: Four times a day (QID) | ORAL | Status: DC | PRN

## 2014-05-08 MED ORDER — ALPRAZOLAM 1 MG PO TABS: 1.0000 mg | ORAL_TABLET | Freq: Two times a day (BID) | ORAL | Status: DC

## 2014-05-08 NOTE — Telephone Encounter (Signed)
Pt left v/m requesting status of alprazolam,diazepam and oxycodone apap rx.Please advise.

## 2014-05-08 NOTE — Telephone Encounter (Signed)
Patient notified and Rx's placed up front for pick up. 

## 2014-05-08 NOTE — Telephone Encounter (Signed)
printed and placed in Kims' box. 

## 2014-05-14 ENCOUNTER — Other Ambulatory Visit: Payer: Self-pay

## 2014-05-14 MED ORDER — TIZANIDINE HCL 4 MG PO TABS: 4.0000 mg | ORAL_TABLET | Freq: Every day | ORAL | Status: DC

## 2014-05-14 NOTE — Telephone Encounter (Signed)
Pt request refill tizanidine to [facility]. Please advise.

## 2014-05-18 ENCOUNTER — Ambulatory Visit (INDEPENDENT_AMBULATORY_CARE_PROVIDER_SITE_OTHER): Payer: Medicare Other | Admitting: Family Medicine

## 2014-05-18 ENCOUNTER — Encounter: Payer: Self-pay | Admitting: Family Medicine

## 2014-05-18 VITALS — BP 120/80 | HR 83 | Temp 97.5°F | Wt 190.0 lb

## 2014-05-18 DIAGNOSIS — J209 Acute bronchitis, unspecified: Secondary | ICD-10-CM | POA: Insufficient documentation

## 2014-05-18 MED ORDER — GUAIFENESIN-CODEINE 100-10 MG/5ML PO SYRP: 5.0000 mL | ORAL_SOLUTION | Freq: Every evening | ORAL | Status: DC | PRN

## 2014-05-18 MED ORDER — CLARITHROMYCIN 500 MG PO TABS: 500.0000 mg | ORAL_TABLET | Freq: Two times a day (BID) | ORAL | Status: DC

## 2014-05-18 MED ORDER — FLUCONAZOLE 150 MG PO TABS: 150.0000 mg | ORAL_TABLET | Freq: Once | ORAL | Status: DC

## 2014-05-18 NOTE — Progress Notes (Signed)
Pre visit review using our clinic review tool, if applicable. No additional management support is needed unless otherwise documented below in the visit note. 

## 2014-05-18 NOTE — Patient Instructions (Addendum)
Cough suppressant prescription at night. Complete antibiotics. During the day use mucinex DM. Albuterol as needed. Wean off prednisone over time.

## 2014-05-18 NOTE — Progress Notes (Signed)
   Subjective:    Patient ID: Kaylee [name], female    DOB: [birth year], 53 y.o.   MRN: [id number]  URI  This is a new problem. The current episode started 1 to 4 weeks ago (3 weeks). The problem has been gradually worsening. There has been no fever. Associated symptoms include chest pain, congestion, coughing and wheezing. Pertinent negatives include no sinus pain or vomiting. Associated symptoms comments: Soreness in back  bloody mucus  chest tightness  sweat/chills  SOB.. She has tried decongestant, acetaminophen and antihistamine (She has been using a lot of OTC meds) for the symptoms. The treatment provided no relief.    She is on Enbrel for RA.   She has prednsione 5 mg at home she has been [place] in last 3 days.  Also albuterol.  Former smoker , no known COPD. She gets similar chest tightness and wheezing.    Review of Systems  Constitutional: Positive for fatigue. Negative for fever and chills.  HENT: Positive for congestion.   Respiratory: Positive for cough, shortness of breath and wheezing.   Cardiovascular: Positive for chest pain. Negative for leg swelling.  Gastrointestinal: Negative for vomiting.       Objective:   Physical Exam  Constitutional: Vital signs are normal. She appears well-developed and well-nourished. She is cooperative.  Non-toxic appearance. She does not appear ill. No distress.  Raspy smokers voice  HENT:  Head: Normocephalic.  Right Ear: Hearing, tympanic membrane, external ear and ear canal normal. Tympanic membrane is not erythematous, not retracted and not bulging.  Left Ear: Hearing, tympanic membrane, external ear and ear canal normal. Tympanic membrane is not erythematous, not retracted and not bulging.  Nose: Mucosal edema and rhinorrhea present. Right sinus exhibits no maxillary sinus tenderness and no frontal sinus tenderness. Left sinus exhibits no maxillary sinus tenderness and no frontal sinus tenderness.  Mouth/Throat: Uvula is  midline, oropharynx is clear and moist and mucous membranes are normal.  Eyes: Conjunctivae, EOM and lids are normal. Pupils are equal, round, and reactive to light. Lids are everted and swept, no foreign bodies found.  Neck: Trachea normal and normal range of motion. Neck supple. Carotid bruit is not present. No thyroid mass and no thyromegaly present.  Cardiovascular: Normal rate, regular rhythm, S1 normal, S2 normal, normal heart sounds, intact distal pulses and normal pulses.  Exam reveals no gallop and no friction rub.   No murmur heard. Pulmonary/Chest: Effort normal and breath sounds normal. No tachypnea. No respiratory distress. She has no decreased breath sounds. She has no wheezes. She has no rhonchi. She has no rales.  Neurological: She is alert.  Skin: Skin is warm, dry and intact. No rash noted.  Psychiatric: Her speech is normal and behavior is normal. Judgment normal. Her mood appears not anxious. Cognition and memory are normal. She does not exhibit a depressed mood.          Assessment & Plan:

## 2014-05-18 NOTE — Assessment & Plan Note (Signed)
She has longtime smoking history . No recent spirometry.. ? She has COPD, given per report wheeze better treated on own with prednisone.  Given immunocompromised on Enbrel and length of time of symptoms.. Will treat with antibiotics to cover for bacterial source.  She will taper off prednisone and use albuterol as needed for wheeze.  Cough suppressant at night prn.

## 2014-06-11 ENCOUNTER — Other Ambulatory Visit: Payer: Self-pay

## 2014-06-11 MED ORDER — ALPRAZOLAM 1 MG PO TABS: 1.0000 mg | ORAL_TABLET | Freq: Two times a day (BID) | ORAL | Status: DC

## 2014-06-11 MED ORDER — DIAZEPAM 10 MG PO TABS: ORAL_TABLET | ORAL | Status: DC

## 2014-06-11 MED ORDER — OXYCODONE-ACETAMINOPHEN 5-325 MG PO TABS: 1.0000 | ORAL_TABLET | Freq: Four times a day (QID) | ORAL | Status: DC | PRN

## 2014-06-11 NOTE — Telephone Encounter (Signed)
Pt left v/m requesting rx for xanax,valium and oxycodone apap. Call when ready for pick up. Pt needs to pick up before 06/14/14.

## 2014-06-11 NOTE — Telephone Encounter (Signed)
Printed all 3.

## 2014-06-12 NOTE — Telephone Encounter (Signed)
Patient notified and Rx placed up front for pick up. 

## 2014-06-18 ENCOUNTER — Other Ambulatory Visit: Payer: Self-pay | Admitting: Family Medicine

## 2014-06-18 NOTE — Telephone Encounter (Signed)
Received refill request electronically. Last refill 04/16/14 46ml/1 refill, last office visit 05/28/14/acute. Is it okay to refill medication?

## 2014-06-19 ENCOUNTER — Telehealth: Payer: Self-pay | Admitting: *Deleted

## 2014-06-19 NOTE — Telephone Encounter (Signed)
PA required for hyoscyamine. Form in your IN box for completion.

## 2014-06-20 NOTE — Telephone Encounter (Signed)
Form filled and in Kim's box. 

## 2014-06-20 NOTE — Telephone Encounter (Signed)
Form faxed

## 2014-06-25 ENCOUNTER — Encounter: Payer: Self-pay | Admitting: Family Medicine

## 2014-06-25 ENCOUNTER — Ambulatory Visit (INDEPENDENT_AMBULATORY_CARE_PROVIDER_SITE_OTHER): Payer: Medicare Other | Admitting: Family Medicine

## 2014-06-25 VITALS — BP 132/82 | HR 76 | Temp 98.2°F | Wt 191.0 lb

## 2014-06-25 DIAGNOSIS — R5382 Chronic fatigue, unspecified: Secondary | ICD-10-CM

## 2014-06-25 DIAGNOSIS — E785 Hyperlipidemia, unspecified: Secondary | ICD-10-CM

## 2014-06-25 DIAGNOSIS — M069 Rheumatoid arthritis, unspecified: Secondary | ICD-10-CM

## 2014-06-25 DIAGNOSIS — Z79899 Other long term (current) drug therapy: Secondary | ICD-10-CM

## 2014-06-25 DIAGNOSIS — R892 Abnormal level of other drugs, medicaments and biological substances in specimens from other organs, systems and tissues: Secondary | ICD-10-CM

## 2014-06-25 DIAGNOSIS — G894 Chronic pain syndrome: Secondary | ICD-10-CM

## 2014-06-25 LAB — VITAMIN B12: VITAMIN B 12: 314 pg/mL (ref 211–911)

## 2014-06-25 LAB — HEPATIC FUNCTION PANEL
ALT: 26 U/L (ref 0–35)
AST: 23 U/L (ref 0–37)
Albumin: 4.1 g/dL (ref 3.5–5.2)
Alkaline Phosphatase: 55 U/L (ref 39–117)
BILIRUBIN DIRECT: 0 mg/dL (ref 0.0–0.3)
BILIRUBIN TOTAL: 0.6 mg/dL (ref 0.2–1.2)
Total Protein: 6.6 g/dL (ref 6.0–8.3)

## 2014-06-25 MED ORDER — OXYCODONE-ACETAMINOPHEN 7.5-325 MG PO TABS: 1.0000 | ORAL_TABLET | Freq: Four times a day (QID) | ORAL | Status: DC | PRN

## 2014-06-25 MED ORDER — B-12 1000 MCG PO CAPS: 1.0000 | ORAL_CAPSULE | Freq: Every day | ORAL | Status: DC

## 2014-06-25 NOTE — Progress Notes (Signed)
Pre visit review using our clinic review tool, if applicable. No additional management support is needed unless otherwise documented below in the visit note. 

## 2014-06-25 NOTE — Assessment & Plan Note (Signed)
Recheck today. 

## 2014-06-25 NOTE — Assessment & Plan Note (Signed)
See below. Will try higher percocet dose (7.5mg ). #80 provided today, advised not to mix with alcohol.

## 2014-06-25 NOTE — Assessment & Plan Note (Signed)
Pt adamant about not wanting MTX. continue f/u with Kaylee [name] of [facility]. Enbrel not as effective as initially.  Will increase percocets to 7.5/325mg  and provided with #80 today. Pt to run out of 5/325mg  early this month 2/2 increased pain from overexertion. Discussed should not be overdoing it with her RA disease process. Discussed higher dose should be temporary measure.

## 2014-06-25 NOTE — Patient Instructions (Addendum)
UDS today. Labwork today. We can increase pain regimen to oxycodone 7.5mg  - try this for next 2 weeks. Don't mix with alcohol. Schedule sooner follow up appointment with Dr Maple Hudson.

## 2014-06-25 NOTE — Progress Notes (Signed)
BP 132/82 mmHg  Pulse 76  Temp(Src) 98.2 F (36.8 C) (Oral)  Wt 191 lb (86.637 kg)   CC: 4 mo f/u  Subjective:    Patient ID: Kaylee [name], female    DOB: [birth year], 53 y.o.   MRN: [id number]  HPI: Kaylee [name] is a 53 y.o. female presenting on 06/25/2014 for Follow-up   Boyfriend recently found to have worsening liver trouble. H/o Hep C. Cousin with bad arthritis - husband left her. Financial concerns. Daughter having difficult pregnancy.  RA - requests LFTs today. On enbrel - not helping. Requests increase dose of pain regimen. Running out of med early. She took prednisone yesterday.  Has f/u appt with rheum 08/2014. Endorses this is first time she has run out of regimen early.  Atypical chest pain from 02/2014 - s/p normal catheterization 02/2014, thought anxiety related so started on new anxiolytic regimen of xanax 1mg  in am, valium 10mg  in late morning, 1 xanax in pm and 10mg  valium to sleep.  Abnormal drug screen Date: 11/2013 inapprop positive EtOH, hydromorphone, hydrocodone. 01/2014 appropriate screen. Due for rpt today.   Relevant past medical, surgical, family and social history reviewed and updated as indicated. Interim medical history since our last visit reviewed. Allergies and medications reviewed and updated.  Current Outpatient Prescriptions on File Prior to Visit  Medication Sig  . albuterol (PROVENTIL HFA;VENTOLIN HFA) 108 (90 BASE) MCG/ACT inhaler Inhale 2 puffs into the lungs every 6 (six) hours as needed.  . ALPRAZolam (XANAX) 1 MG tablet Take 1 tablet (1 mg total) by mouth 2 (two) times daily.  aspirin 81 MG tablet Take 81 mg by mouth daily.  12/2013 b complex vitamins tablet Take 1 tablet by mouth daily.  . diazepam (VALIUM) 10 MG tablet TAKE ONE TABLET TWICE DAILY  . EPINEPHrine 0.3 mg/0.3 mL IJ SOAJ injection Inject 0.3 mg into the muscle once. For quinolone reaction  . Etanercept (ENBREL Adams) Inject into the skin once a week.  . fluconazole  (DIFLUCAN) 150 MG tablet Take 1 tablet (150 mg total) by mouth once.  . lansoprazole (PREVACID) 30 MG capsule TAKE ONE (1) CAPSULE EACH DAY  . Multiple Vitamin (MULTIVITAMIN) tablet Take 1 tablet by mouth daily.  02/2014 neomycin-polymyxin b-dexamethasone (MAXITROL) 3.5-10000-0.1 SUSP PUT ONE DROP INTO THE LEFT EYE AS NEEDED  . neomycin-polymyxin-hydrocortisone (CORTISPORIN) otic solution Place 3 drops into both ears 4 (four) times daily.  . nitrofurantoin (MACRODANTIN) 100 MG capsule TAKE ONE (1) CAPSULE EACH DAY FOR UTI  . Omega-3 Fatty Acids (FISH OIL) 1200 MG CAPS Take by mouth 2 (two) times daily.  . polyethylene glycol powder (GLYCOLAX/MIRALAX) powder MIX 17GM (AS MARKED IN BOTTLE TOP) IN 8 OUNCES OF WATER, MIX AND DRINK TWICE A DAY AS NEEDED FOR MODERATE CONSTIPATION.  [name] polyethylene glycol powder (GLYCOLAX/MIRALAX) powder MIX 17GM (AS MARKED IN BOTTLE TOP) IN 8 OUNCES OF WATER, MIX AND DRINK TWICE A DAY AS NEEDED FOR MODERATE CONSTIPATION.  [name] predniSONE (DELTASONE) 5 MG tablet Take 5 mg by mouth daily with breakfast.  . tiZANidine (ZANAFLEX) 4 MG tablet Take 1 tablet (4 mg total) by mouth at bedtime.  . clarithromycin (BIAXIN) 500 MG tablet Take 1 tablet (500 mg total) by mouth 2 (two) times daily. (Patient not taking: Reported on 06/25/2014)  . guaiFENesin-codeine (ROBITUSSIN AC) 100-10 MG/5ML syrup Take 5-10 mLs by mouth at bedtime as needed for cough. (Patient not taking: Reported on 06/25/2014)  . hyoscyamine (LEVSIN, ANASPAZ) 0.125 MG tablet TAKE ONE  TABLET EVERY SIX (6) HOURS AS NEEDED FOR CRAMPING (Patient not taking: Reported on 06/25/2014)  . lidocaine (LIDODERM) 5 % Place 1 patch onto the skin daily. Remove & Discard patch within 12 hours or as directed by MD (Patient not taking: Reported on 06/25/2014)   No current facility-administered medications on file prior to visit.    Review of Systems Per HPI unless specifically indicated above     Objective:    BP 132/82 mmHg  Pulse 76   Temp(Src) 98.2 F (36.8 C) (Oral)  Wt 191 lb (86.637 kg)  Wt Readings from Last 3 Encounters:  06/25/14 191 lb (86.637 kg)  05/18/14 190 lb (86.183 kg)  03/13/14 180 lb 8 oz (81.874 kg)    Physical Exam  Constitutional: She appears well-developed and well-nourished. No distress.  HENT:  Mouth/Throat: Oropharynx is clear and moist. No oropharyngeal exudate.  Eyes: Conjunctivae and EOM are normal. Pupils are equal, round, and reactive to light. No scleral icterus.  Cardiovascular: Normal rate, regular rhythm, normal heart sounds and intact distal pulses.   No murmur heard. Pulmonary/Chest: Effort normal and breath sounds normal. No respiratory distress. She has no wheezes. She has no rales.  Musculoskeletal: She exhibits no edema.  Chronic joint pains without active synovitis today  Skin: Skin is warm and dry. No rash noted.  Psychiatric: She has a normal mood and affect.  Nursing note and vitals reviewed.      Assessment & Plan:   Problem List Items Addressed This Visit    Rheumatoid arthritis - Primary    Pt adamant about not wanting MTX. continue f/u with Azucena Fallen of [facility]. Enbrel not as effective as initially.  Will increase percocets to 7.5/325mg  and provided with #80 today. Pt to run out of 5/325mg  early this month 2/2 increased pain from overexertion. Discussed should not be overdoing it with her RA disease process. Discussed higher dose should be temporary measure.     Relevant Medications      Aspirin-Acetaminophen-Caffeine (EXCEDRIN PO)      PERCOCET 7.5-325 MG PO TABS   Other Relevant Orders      Hepatic Function Panel   Chronic pain syndrome    See below. Will try higher percocet dose (7.5mg ). #80 provided today, advised not to mix with alcohol.    Abnormal drug screen    Recheck today.     Other Visit Diagnoses    Chronic fatigue        Relevant Orders       Vitamin B12        Follow up plan: Return in about 3 months (around  09/24/2014), or as needed, for follow up visit.

## 2014-06-27 ENCOUNTER — Encounter: Payer: Self-pay | Admitting: *Deleted

## 2014-07-01 ENCOUNTER — Emergency Department: Payer: Self-pay | Admitting: Emergency Medicine

## 2014-07-01 DIAGNOSIS — R0789 Other chest pain: Secondary | ICD-10-CM | POA: Diagnosis not present

## 2014-07-01 DIAGNOSIS — R0689 Other abnormalities of breathing: Secondary | ICD-10-CM | POA: Diagnosis not present

## 2014-07-01 LAB — URINALYSIS, COMPLETE
Bilirubin,UR: NEGATIVE
Blood: NEGATIVE
Glucose,UR: NEGATIVE mg/dL (ref 0–75)
Ketone: NEGATIVE
Leukocyte Esterase: NEGATIVE
Nitrite: NEGATIVE
PH: 8 (ref 4.5–8.0)
PROTEIN: NEGATIVE
SPECIFIC GRAVITY: 1.016 (ref 1.003–1.030)
Squamous Epithelial: 1
WBC UR: <1 /HPF (ref 0–5)

## 2014-07-01 LAB — COMPREHENSIVE METABOLIC PANEL
ALBUMIN: 3.5 g/dL (ref 3.4–5.0)
AST: 24 U/L (ref 15–37)
Alkaline Phosphatase: 61 U/L
Anion Gap: 6 — ABNORMAL LOW (ref 7–16)
BUN: 20 mg/dL — AB (ref 7–18)
Bilirubin,Total: 0.3 mg/dL (ref 0.2–1.0)
CALCIUM: 8.9 mg/dL (ref 8.5–10.1)
CO2: 29 mmol/L (ref 21–32)
Chloride: 105 mmol/L (ref 98–107)
Creatinine: 0.86 mg/dL (ref 0.60–1.30)
EGFR (African American): >60
EGFR (Non-African Amer.): >60
Glucose: 143 mg/dL — ABNORMAL HIGH (ref 65–99)
OSMOLALITY: 284 (ref 275–301)
POTASSIUM: 4.2 mmol/L (ref 3.5–5.1)
SGPT (ALT): 25 U/L
Sodium: 140 mmol/L (ref 136–145)
Total Protein: 6.7 g/dL (ref 6.4–8.2)

## 2014-07-01 LAB — CBC
HCT: 40.2 % (ref 35.0–47.0)
HGB: 13.2 g/dL (ref 12.0–16.0)
MCH: 31.3 pg (ref 26.0–34.0)
MCHC: 32.9 g/dL (ref 32.0–36.0)
MCV: 95 fL (ref 80–100)
Platelet: 249 10*3/uL (ref 150–440)
RBC: 4.23 10*6/uL (ref 3.80–5.20)
RDW: 12.4 % (ref 11.5–14.5)
WBC: 8.5 10*3/uL (ref 3.6–11.0)

## 2014-07-01 LAB — TROPONIN I: Troponin-I: 0.02 ng/mL

## 2014-07-01 LAB — MAGNESIUM: Magnesium: 1.9 mg/dL

## 2014-07-02 ENCOUNTER — Telehealth: Payer: Self-pay

## 2014-07-02 DIAGNOSIS — I4891 Unspecified atrial fibrillation: Secondary | ICD-10-CM

## 2014-07-02 NOTE — Telephone Encounter (Signed)
Patient stated she is having issues with her Afib  She was in ED last night  She would like to see Dr. Mariah Milling sooner than 07/25/14  Patient rescheduled for 07/29/14 Patient advised to call if she has any issues between now and then  Patient verbalized understanding

## 2014-07-02 NOTE — Telephone Encounter (Signed)
Pt called, states she has been in afib for the past 3 days, she was seen in ED last night. Please call.

## 2014-07-03 ENCOUNTER — Other Ambulatory Visit: Payer: Self-pay

## 2014-07-03 NOTE — Telephone Encounter (Signed)
Pt left v/m; pt requesting rx for xanax, percocet and valium. Xanax #60 was given on 06/11/14, percocet #80 filled on 06/25/14 and Valium #60  On 06/11/14. Is OK to refill meds. Pt was d/c from [facility] for atrial fib on 07/01/14. Pt wants to know when office is closed for holidays. Pt has appt with Dr Mariah Milling scheduled 07/19/2014 for afib (pt seen ED). Pt wants Dr Reece Agar to get information from Dr Maple Hudson the rheumatologist. Pt does not know what is causing the afib; pt thinks could be enbrel or prednisone. Pt request cb.

## 2014-07-04 NOTE — Telephone Encounter (Signed)
Patient notified too early for refills. She was aware but thought we would be closed due to the holidays and wanted to go ahead and get Rx's. I advised that Dr. Reece Agar was out of the office until next Tuesday and he would refill when he returns and gave her the hours/days we were closed. She verbalized understanding.

## 2014-07-04 NOTE — Telephone Encounter (Signed)
Would recommend a 2 day monitor If atrial fibrillation or palpitations not frequent, would order a 30 day monitor

## 2014-07-04 NOTE — Telephone Encounter (Signed)
Too early on all of it.  Will have to wait and have PCP review. She can request on 07/09/14 when PCP is back in town.

## 2014-07-05 NOTE — Telephone Encounter (Signed)
Left detailed message w/ Dr. Gollan's recommendation.  Asked pt to call back w/ any questions or concerns.  

## 2014-07-05 NOTE — Addendum Note (Signed)
Addended by: Marilynne Halsted on: 07/05/2014 08:56 AM   Modules accepted: Orders

## 2014-07-11 ENCOUNTER — Other Ambulatory Visit: Payer: Self-pay

## 2014-07-11 DIAGNOSIS — R892 Abnormal level of other drugs, medicaments and biological substances in specimens from other organs, systems and tissues: Secondary | ICD-10-CM

## 2014-07-11 MED ORDER — OXYCODONE-ACETAMINOPHEN 7.5-325 MG PO TABS: 1.0000 | ORAL_TABLET | Freq: Three times a day (TID) | ORAL | Status: DC | PRN

## 2014-07-11 MED ORDER — DIAZEPAM 10 MG PO TABS: ORAL_TABLET | ORAL | Status: DC

## 2014-07-11 MED ORDER — ALPRAZOLAM 1 MG PO TABS: 1.0000 mg | ORAL_TABLET | Freq: Two times a day (BID) | ORAL | Status: DC

## 2014-07-11 NOTE — Telephone Encounter (Signed)
Abnormal drug screen Date: 11/2013 inapprop positive EtOH, hydromorphone, hydrocodone. 01/2014 appropriate screen. 06/2014 appropriate screen. Rpt 3 mo plz phone in diazepam and alprazolam.  Regarding oxycodone - #80 given 06/25/2014. This should have lasted 20 days, it has only been 16 days.  She is overusing - needs to make appointment with Rheum to discuss better measures to control arthritis. Will refill oxycodone but will have her decrease to max 3 a day. Printed and in Kim's box. #90 to last 1 month.

## 2014-07-11 NOTE — Telephone Encounter (Signed)
Pt request refill on Alprazolam,diazepam and oxycodone-apap. Pt said she will take which ever strength of oxycodone Dr Reece Agar will give her. Pt request cb when can pick up rx and pt does need to pick up by 07/12/14 at lunch time (prior to office closing for holiday).

## 2014-07-12 NOTE — Telephone Encounter (Signed)
Rx's called in as directed. Patient notified and said she still had oxycodone left and was fine with the post-date. She just wanted to go ahead and pick it up due to the holiday. She said she is only taking it as directed. Rx placed up front for pick up.

## 2014-07-19 ENCOUNTER — Ambulatory Visit: Payer: Medicare Other | Admitting: Cardiovascular Disease

## 2014-07-20 ENCOUNTER — Encounter: Payer: Self-pay | Admitting: Family Medicine

## 2014-07-24 ENCOUNTER — Ambulatory Visit: Payer: Medicare Other | Admitting: Cardiovascular Disease

## 2014-07-25 ENCOUNTER — Ambulatory Visit: Payer: Medicare Other | Admitting: Cardiovascular Disease

## 2014-07-27 ENCOUNTER — Telehealth: Payer: Self-pay

## 2014-07-27 NOTE — Telephone Encounter (Signed)
Pt is not going to see Dr Maple Hudson anymore; pt is not taking chemo(Enbrel) anymore and heart has been fine since stopping chemo. Arthrotec effects pt heart; so pt is not taking Arthrotec and pt request etodolac 400 mg to [facility] for inflammation.Pt has taken etodolac before without problems. Pt request cb when sent to pharmacy.

## 2014-07-30 MED ORDER — ETODOLAC 400 MG PO TABS: 400.0000 mg | ORAL_TABLET | Freq: Two times a day (BID) | ORAL | Status: DC

## 2014-07-30 NOTE — Telephone Encounter (Signed)
Sent in.plz notify pt.  
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
## 2014-07-30 NOTE — Telephone Encounter (Signed)
Message left advising patient.  

## 2014-08-02 ENCOUNTER — Ambulatory Visit: Payer: Medicaid Other | Admitting: Cardiovascular Disease

## 2014-08-06 ENCOUNTER — Ambulatory Visit: Payer: Medicaid Other | Admitting: Cardiovascular Disease

## 2014-08-06 ENCOUNTER — Other Ambulatory Visit: Payer: Self-pay | Admitting: *Deleted

## 2014-08-06 DIAGNOSIS — K219 Gastro-esophageal reflux disease without esophagitis: Secondary | ICD-10-CM

## 2014-08-06 MED ORDER — DIAZEPAM 10 MG PO TABS: ORAL_TABLET | ORAL | Status: DC

## 2014-08-06 MED ORDER — ALPRAZOLAM 1 MG PO TABS: 1.0000 mg | ORAL_TABLET | Freq: Two times a day (BID) | ORAL | Status: DC

## 2014-08-06 MED ORDER — OXYCODONE-ACETAMINOPHEN 7.5-325 MG PO TABS: 1.0000 | ORAL_TABLET | Freq: Three times a day (TID) | ORAL | Status: DC | PRN

## 2014-08-06 NOTE — Telephone Encounter (Signed)
Printed and in Kim's box 
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
## 2014-08-07 NOTE — Telephone Encounter (Signed)
Patient notified and Rx's placed up front for pick up. Patient asking for Advair instead of albuterol. She said she cannot take the albuterol because it makes her heart race. She has been using her cousin's Advair and it has been helping. She said that since she has been taking the chemo, it has "messed with her lungs and is causing her to choke on her own spit at night and this is the only thing that has kept her alive". I advised that this was a daily med vs the rescue inhaler like the albuterol and she didn't really have a diagnosis to support it, so her insurance may not cover it. She said she needs it and it better be approved.

## 2014-08-08 ENCOUNTER — Telehealth: Payer: Self-pay | Admitting: Family Medicine

## 2014-08-08 DIAGNOSIS — M069 Rheumatoid arthritis, unspecified: Secondary | ICD-10-CM

## 2014-08-08 DIAGNOSIS — G894 Chronic pain syndrome: Secondary | ICD-10-CM

## 2014-08-08 MED ORDER — LANSOPRAZOLE 30 MG PO CPDR: 30.0000 mg | DELAYED_RELEASE_CAPSULE | Freq: Two times a day (BID) | ORAL | Status: DC

## 2014-08-08 NOTE — Telephone Encounter (Signed)
referral placed. It will take time to get her into new rheumatologist. I never received records from Dr Maple Hudson.

## 2014-08-08 NOTE — Telephone Encounter (Signed)
Completely different meds, currently no indication for advair.  If desires to discuss schedule appointment - with smoking history would recommend spirometry to see if advair is appropriate. Embrel is not chemotherapy.

## 2014-08-08 NOTE — Telephone Encounter (Signed)
Ms Tortora called stating she cannot take embral that  her rheumatology Dr Maple Hudson prescribed.  She wants to get a new Rheumatology dr in [place].  Due to transportation she can get transportation through [employer].

## 2014-08-08 NOTE — Telephone Encounter (Signed)
We really should schedule OV to fully evaluate multiple symptoms she endorses rather than treating over the phone. For now, may increase prevacid to 30mg  bid x 3 weeks. If not improved, schedule office visit.

## 2014-08-08 NOTE — Telephone Encounter (Signed)
Spoke with patient. After further questioning, it appears that she is having reflux. She said she feels "bile" coming up her throat anytime she lays flat and it causes her to cough and get choked. She said she has to prop herself up on a lot of pillows to stop it from happening. She is also complaining of being hoarse. Now she asks for a different reflux med since prevacid doesn't seem to be working any longer. I advised that if she still wanted/ "needed" advair, she would need an appt. She verbalized understanding and declined to schedule one at this time.

## 2014-08-09 NOTE — Telephone Encounter (Signed)
Message left notifying patient.

## 2014-08-15 DIAGNOSIS — H2511 Age-related nuclear cataract, right eye: Secondary | ICD-10-CM | POA: Diagnosis not present

## 2014-08-22 ENCOUNTER — Ambulatory Visit: Payer: Medicaid Other | Admitting: Cardiovascular Disease

## 2014-08-23 ENCOUNTER — Ambulatory Visit: Payer: Medicaid Other | Admitting: Cardiovascular Disease

## 2014-08-29 ENCOUNTER — Telehealth: Payer: Self-pay | Admitting: Family Medicine

## 2014-08-29 NOTE — Telephone Encounter (Signed)
Patient notified and will await call from [facility].

## 2014-08-29 NOTE — Telephone Encounter (Signed)
Thank you! I'll let her know that they will contact her.

## 2014-08-29 NOTE — Telephone Encounter (Signed)
Yes, pt was contacted by [place] on 08/09/14. She stated at that time she had an upcoming appt with Dr. Maple Hudson on 08/16/14 and would let us know how that appt went and whether or not she wanted to continue with Rheumatology referral in [place]. I will fax referral to Dr. Gavin Potters at [facility]; once they review records they will call pt to schedule.

## 2014-08-29 NOTE — Telephone Encounter (Signed)
Has she not been contacted in reference to this? Her referral was placed on 08/08/14. Please contact her as soon as you can. Thanks!

## 2014-08-29 NOTE — Telephone Encounter (Signed)
Patient would like to change Reumatologist.  She would like to have someone in [place].  She will not go back to her present one.

## 2014-08-30 ENCOUNTER — Encounter: Payer: Self-pay | Admitting: Cardiovascular Disease

## 2014-08-30 ENCOUNTER — Ambulatory Visit (INDEPENDENT_AMBULATORY_CARE_PROVIDER_SITE_OTHER): Payer: Medicare Other | Admitting: Cardiovascular Disease

## 2014-08-30 VITALS — BP 110/76 | HR 81 | Ht 66.0 in | Wt 191.5 lb

## 2014-08-30 DIAGNOSIS — R079 Chest pain, unspecified: Secondary | ICD-10-CM

## 2014-08-30 DIAGNOSIS — R609 Edema, unspecified: Secondary | ICD-10-CM | POA: Diagnosis not present

## 2014-08-30 DIAGNOSIS — R0789 Other chest pain: Secondary | ICD-10-CM

## 2014-08-30 DIAGNOSIS — J40 Bronchitis, not specified as acute or chronic: Secondary | ICD-10-CM | POA: Insufficient documentation

## 2014-08-30 DIAGNOSIS — R0602 Shortness of breath: Secondary | ICD-10-CM | POA: Diagnosis not present

## 2014-08-30 DIAGNOSIS — R0609 Other forms of dyspnea: Secondary | ICD-10-CM

## 2014-08-30 DIAGNOSIS — R9431 Abnormal electrocardiogram [ECG] [EKG]: Secondary | ICD-10-CM

## 2014-08-30 MED ORDER — POTASSIUM CHLORIDE ER 10 MEQ PO TBCR: 10.0000 meq | EXTENDED_RELEASE_TABLET | Freq: Every day | ORAL | Status: DC | PRN

## 2014-08-30 MED ORDER — FUROSEMIDE 20 MG PO TABS: 20.0000 mg | ORAL_TABLET | Freq: Every day | ORAL | Status: DC | PRN

## 2014-08-30 MED ORDER — AZITHROMYCIN 250 MG PO TABS: ORAL_TABLET | ORAL | Status: DC

## 2014-08-30 NOTE — Assessment & Plan Note (Signed)
Shortness of breath on exertion stable, no further workup at this time. Resolving upper respiratory infection

## 2014-08-30 NOTE — Assessment & Plan Note (Signed)
Abnormal EKG had suggested to fibrillation. This is actually normal sinus rhythm with APCs

## 2014-08-30 NOTE — Assessment & Plan Note (Signed)
Continued cough, congestion, sputum. She has been taking her family's Biaxin for many days now. No improvement in her symptoms. We'll give her a Z-Pak

## 2014-08-30 NOTE — Progress Notes (Signed)
Patient ID: Kaylee [name], female    DOB: Nov 01, 1960, 54 y.o.   MRN: [id number]  HPI Comments: Kaylee [name] is a 54 year old female with history of rheumatoid arthritis, depression, anxiety previous episode of chest pain, evaluated in the hospital who underwent stress test showing no ischemia, who presents for follow-up today of her shortness of breath, often congestion  She reports that she had a severe reaction to  Enbrel. This caused her significant anxiety, shortness of breath, fluid retention, malaise. She stopped the medication. Around the same time she developed upper respiratory infection. Sister provided her with Biaxin. She states that she has taken more than 2 courses of Biaxin for 10 days with minimal improvement of her coughing, congestion, sputum. She also been borrowing Lasix and potassium from her sister for leg swelling with improvement of her symptoms. She is requesting Lasix and potassium today as well as alternate antibiotic for her bronchitis. She's been taking more pain medication for recent severe leg pain. She attributes this to the enbrel.  She states that she went to the emergency room 07/01/2014 for malaise, shortness of breath, chest discomfort, anxiety. She states this was a reaction to the enbrel. She was told that she had atrial fibrillation. Review of the EKG showed normal sinus rhythm with APCs. Blood work and EKG reviewed with her. Lab work essentially normal with negative cardiac enzymes, normal LFTs  EKG on today's visit shows normal sinus rhythm with rate 81 bpm, no significant ST or T-wave changes    Allergies  Allergen Reactions  . Avelox [Moxifloxacin Hcl In Nacl] Anaphylaxis  . Gabapentin Swelling  . Methadone Hcl     dyspnea  . Morphine     dyspnea  . Quinolones Other (See Comments)    avelox caused generalized swelling and throat swelling  . Sulfonamide Derivatives     REACTION: Hives/swelling    Outpatient Encounter Prescriptions as of  08/30/2014  Medication Sig  . albuterol (PROVENTIL HFA;VENTOLIN HFA) 108 (90 BASE) MCG/ACT inhaler Inhale 2 puffs into the lungs every 6 (six) hours as needed.  . ALPRAZolam (XANAX) 1 MG tablet Take 1 tablet (1 mg total) by mouth 2 (two) times daily.  [name] aspirin 81 MG tablet Take 81 mg by mouth daily.  [name] b complex vitamins tablet Take 1 tablet by mouth daily.  [name] BIOTIN PO Take by mouth daily.  . calcium-vitamin D (OSCAL) 250-125 MG-UNIT per tablet Take 2 tablets by mouth daily.  . clarithromycin (BIAXIN) 500 MG tablet Take 500 mg by mouth 2 (two) times daily.  . Cyanocobalamin (B-12) 1000 MCG CAPS Take 1 capsule by mouth daily.  . diazepam (VALIUM) 10 MG tablet TAKE ONE TABLET TWICE DAILY  . dicyclomine (BENTYL) 10 MG capsule Take 10 mg by mouth every 6 (six) hours.  [name] EPINEPHrine 0.3 mg/0.3 mL IJ SOAJ injection Inject 0.3 mg into the muscle once. For quinolone reaction  . etodolac (LODINE) 400 MG tablet Take 1 tablet (400 mg total) by mouth 2 (two) times daily.  . fluconazole (DIFLUCAN) 150 MG tablet Take 1 tablet (150 mg total) by mouth once.  . furosemide (LASIX) 20 MG tablet Take 1 tablet (20 mg total) by mouth daily as needed.  . lansoprazole (PREVACID) 30 MG capsule Take 1 capsule (30 mg total) by mouth 2 (two) times daily before a meal.  . Multiple Vitamin (MULTIVITAMIN) tablet Take 1 tablet by mouth daily.  . Multiple Vitamin (MULTIVITAMIN) tablet Take 1 tablet by mouth daily.  . Multiple  Vitamins-Minerals (PRESERVISION AREDS PO) Take by mouth 2 (two) times daily.  [name] neomycin-polymyxin b-dexamethasone (MAXITROL) 3.5-10000-0.1 SUSP PUT ONE DROP INTO THE LEFT EYE AS NEEDED  . neomycin-polymyxin-hydrocortisone (CORTISPORIN) otic solution Place 3 drops into both ears 4 (four) times daily.  . nitrofurantoin (MACRODANTIN) 100 MG capsule TAKE ONE (1) CAPSULE EACH DAY FOR UTI  . Omega-3 Fatty Acids (FISH OIL) 1200 MG CAPS Take by mouth 2 (two) times daily.  [name] oxyCODONE-acetaminophen (PERCOCET)  7.5-325 MG per tablet Take 1 tablet by mouth every 8 (eight) hours as needed for pain.  . polyethylene glycol powder (GLYCOLAX/MIRALAX) powder MIX 17GM (AS MARKED IN BOTTLE TOP) IN 8 OUNCES OF WATER, MIX AND DRINK TWICE A DAY AS NEEDED FOR MODERATE CONSTIPATION.  [name] polyethylene glycol powder (GLYCOLAX/MIRALAX) powder MIX 17GM (AS MARKED IN BOTTLE TOP) IN 8 OUNCES OF WATER, MIX AND DRINK TWICE A DAY AS NEEDED FOR MODERATE CONSTIPATION.  [name] potassium chloride (K-DUR) 10 MEQ tablet Take 1 tablet (10 mEq total) by mouth daily as needed.  . predniSONE (DELTASONE) 5 MG tablet Take 5 mg by mouth daily with breakfast.  . tiZANidine (ZANAFLEX) 4 MG tablet Take 1 tablet (4 mg total) by mouth at bedtime.  . [DISCONTINUED] furosemide (LASIX) 20 MG tablet Take 20 mg by mouth.  . [DISCONTINUED] potassium chloride (K-DUR) 10 MEQ tablet Take 10 mEq by mouth daily.  [name] azithromycin (ZITHROMAX) 250 MG tablet Take one pill once a day (two the first day)  . [DISCONTINUED] Aspirin-Acetaminophen-Caffeine (EXCEDRIN PO) Take by mouth as needed.  . [DISCONTINUED] Etanercept (ENBREL Rossie) Inject into the skin once a week.  . [DISCONTINUED] Glucosamine-Chondroit-Vit C-Mn (GLUCOSAMINE CHONDR 500 COMPLEX PO) Take by mouth daily.  . [DISCONTINUED] guaiFENesin-codeine (ROBITUSSIN AC) 100-10 MG/5ML syrup Take 5-10 mLs by mouth at bedtime as needed for cough. (Patient not taking: Reported on 06/25/2014)  . [DISCONTINUED] hyoscyamine (LEVSIN, ANASPAZ) 0.125 MG tablet TAKE ONE TABLET EVERY SIX (6) HOURS AS NEEDED FOR CRAMPING (Patient not taking: Reported on 06/25/2014)  . [DISCONTINUED] lidocaine (LIDODERM) 5 % Place 1 patch onto the skin daily. Remove & Discard patch within 12 hours or as directed by MD (Patient not taking: Reported on 06/25/2014)    Past Medical History  Diagnosis Date  . ALLERGIC RHINITIS CAUSE UNSPECIFIED 03/23/2009  . ANXIETY DEPRESSION 03/26/2008  . BRONCHITIS 03/26/2008  . HIP PAIN, BILATERAL 09/21/2008  .  Irritable bowel syndrome 03/26/2008  . OTITIS MEDIA, CHRONIC 03/26/2008  . PERIPHERAL EDEMA 03/26/2008  . Rheumatoid arthritis(714.0) 03/26/2008  . TOBACCO ABUSE 06/24/2009  . URINARY TRACT INFECTION, CHRONIC 03/26/2008  . Diabetes mellitus without complication     ???  . Abnormal drug screen 11/2013    Date: 11/2013 inapprop positive EtOH, hydromorphone, hydrocodone. 01/2014 appropriate screen. 06/2014 appropriate screen. Rpt 3 mo  . Depression   . History of kidney infection   . Asthma   . Rhabdomyolysis 12/2013    ?exercise induced  . GERD (gastroesophageal reflux disease)   . HLD (hyperlipidemia) 02/23/2014    Past Surgical History  Procedure Laterality Date  . Mouth surgery    . Middle ear surgery Left 1980    reconstructive  . Tonsillectomy    . Foot surgery Left x3  . Tubal ligation    . Abdominal hysterectomy  2000    cervical dysplasia, ovaries remain  . Colonoscopy  09/2013    WNL [name])  . Nuclear stress test  12/2013    no ischemia  . US echocardiography  01/2014  WNL  . Cardiac catheterization  02/2014    no occlusivve CAD, R dominant system with nl EF ([place])    Social History  reports that she has quit smoking. She has never used smokeless tobacco. She reports that she drinks alcohol. She reports that she does not use illicit drugs.  Family History family history includes Alcohol abuse in her father; Alzheimer's disease (age of onset: 41) in her father; Breast cancer in her paternal grandmother; Colon cancer in her maternal grandmother and paternal grandmother; Coronary artery disease in her maternal grandmother; Healthy in her mother; Hypertension in her father.   Review of Systems  Constitutional: Negative.   HENT: Positive for congestion.   Eyes: Negative.   Respiratory: Positive for cough and shortness of breath.   Cardiovascular: Positive for leg swelling.  Gastrointestinal: Negative.   Musculoskeletal: Negative.   Skin: Negative.   Neurological:  Negative.   Hematological: Negative.   Psychiatric/Behavioral: Negative.   All other systems reviewed and are negative.   BP 110/76 mmHg  Pulse 81  Ht 5\' 6"  (1.676 m)  Wt 191 lb 8 oz (86.864 kg)  BMI 30.92 kg/m2  Physical Exam  Constitutional: She is oriented to person, place, and time. She appears well-developed and well-nourished.  HENT:  Head: Normocephalic.  Nose: Nose normal.  Mouth/Throat: Oropharynx is clear and moist.  Eyes: Conjunctivae are normal. Pupils are equal, round, and reactive to light.  Neck: Normal range of motion. Neck supple. No JVD present.  Cardiovascular: Normal rate, regular rhythm, S1 normal, S2 normal, normal heart sounds and intact distal pulses.  Exam reveals no gallop and no friction rub.   No murmur heard. Pulmonary/Chest: Effort normal and breath sounds normal. No respiratory distress. She has no wheezes. She has no rales. She exhibits no tenderness.  Abdominal: Soft. Bowel sounds are normal. She exhibits no distension. There is no tenderness.  Musculoskeletal: Normal range of motion. She exhibits no edema or tenderness.  Lymphadenopathy:    She has no cervical adenopathy.  Neurological: She is alert and oriented to person, place, and time. Coordination normal.  Skin: Skin is warm and dry. No rash noted. No erythema.  Psychiatric: She has a normal mood and affect. Her behavior is normal. Judgment and thought content normal.    Assessment and Plan  Nursing note and vitals reviewed.

## 2014-08-30 NOTE — Assessment & Plan Note (Signed)
Recent atypical chest pain, no further workup at this time. Recent negative stress test

## 2014-08-30 NOTE — Assessment & Plan Note (Signed)
Unable to exclude complication from her enbrel. She reports feeling better with less swelling on Lasix and potassium as needed. She's not taking this daily. Prescription provided

## 2014-08-30 NOTE — Patient Instructions (Addendum)
Ok to take lasix with potassium as needed for leg swelling  Take Z-pak two the first day, then one a day  Please call us if you have new issues that need to be addressed before your next appt.  Your physician wants you to follow-up in: 6 months.  You will receive a reminder letter in the mail two months in advance. If you don't receive a letter, please call our office to schedule the follow-up appointment.

## 2014-09-03 ENCOUNTER — Emergency Department: Payer: Self-pay | Admitting: Emergency Medicine

## 2014-09-03 DIAGNOSIS — Z043 Encounter for examination and observation following other accident: Secondary | ICD-10-CM | POA: Diagnosis not present

## 2014-09-05 ENCOUNTER — Other Ambulatory Visit: Payer: Self-pay

## 2014-09-05 NOTE — Telephone Encounter (Signed)
Pt left v/m; pt is doing better and pt request new rx for oxycodone apap 5-325 mg quantity #120. Pt does not need oxycodone 7.5 - 325 mg now. Call back when rx ready for pick up. Pt last seen 06/25/14.

## 2014-09-06 ENCOUNTER — Other Ambulatory Visit: Payer: Self-pay | Admitting: *Deleted

## 2014-09-06 MED ORDER — OXYCODONE-ACETAMINOPHEN 5-325 MG PO TABS: 1.0000 | ORAL_TABLET | Freq: Four times a day (QID) | ORAL | Status: DC | PRN

## 2014-09-06 NOTE — Telephone Encounter (Signed)
Ok to refill? Also requested refills on diclofenac 75 mg 1 BID and misoprostol 200 mcg 1 BID-neither on current med list.

## 2014-09-06 NOTE — Telephone Encounter (Signed)
Message left notifying patient and Rx placed up front for pick up. 

## 2014-09-06 NOTE — Telephone Encounter (Signed)
I'm glad she's doing better. Will decrease dose to 5/325mg . Will provide prior dose of #120 per month. Total monthly dose 600mg  oxycodone vs prior 675mg  (with 7.5mg  dose) Printed and in Kim's box.

## 2014-09-07 MED ORDER — DIAZEPAM 10 MG PO TABS: ORAL_TABLET | ORAL | Status: DC

## 2014-09-07 MED ORDER — ALPRAZOLAM 1 MG PO TABS: 1.0000 mg | ORAL_TABLET | Freq: Two times a day (BID) | ORAL | Status: DC

## 2014-09-07 NOTE — Telephone Encounter (Signed)
Rx called in to requested pharmacy 

## 2014-09-07 NOTE — Telephone Encounter (Signed)
plz phone in. Regarding other meds - she is already on etodolac which acts similarly to diclofenac. She is also already on prevacid which should help protect stomach lining from forming ulcer (which is reason to take misoprostol).  Recommend we talk about meds at next office visit.

## 2014-09-11 ENCOUNTER — Telehealth: Payer: Self-pay

## 2014-09-11 NOTE — Telephone Encounter (Signed)
Pt left v/m; pt has taken several abx and pt now has thrush, pts mouth is sore and request miracle mouthwash to [facility]. Pt does not want to schedule appt because she is sure what is going on. Pt last had f/u visit 06/25/14 with Dr Reece Agar.

## 2014-09-12 MED ORDER — NYSTATIN 100000 UNIT/ML MT SUSP: 5.0000 mL | Freq: Four times a day (QID) | OROMUCOSAL | Status: DC

## 2014-09-12 NOTE — Telephone Encounter (Signed)
Sent in nystatin swish and swallow.  

## 2014-09-20 MED ORDER — FLUCONAZOLE 150 MG PO TABS: 150.0000 mg | ORAL_TABLET | Freq: Once | ORAL | Status: DC

## 2014-09-20 NOTE — Telephone Encounter (Signed)
Received request from pharmacy for diflucan tablet. Will fill x1 but if needs further will need to schedule OV.

## 2014-09-27 ENCOUNTER — Telehealth: Payer: Self-pay | Admitting: Family Medicine

## 2014-09-27 NOTE — Telephone Encounter (Signed)
Message left advising patient to go to ER or [facility] if she was having problems breathing. Advised not to take other people's meds and that you had not given her spiriva before, so she would need appt. I advised to call tomorrow since we had openings for her to be seen.

## 2014-09-27 NOTE — Telephone Encounter (Signed)
I also emphasize pt shouldn't self medicate with other ppl's medications.  Recommend eval in office for possible bronchitis, if acute difficulty breathing rec urgent evaluation at [facility] or ER. I don't see that we've prescribed spiriva in the past.

## 2014-09-27 NOTE — Telephone Encounter (Signed)
PLEASE NOTE: All timestamps contained within this report are represented as [place] Standard Time. CONFIDENTIALTY NOTICE: This fax transmission is intended only for the addressee. It contains information that is legally privileged, confidential or otherwise protected from use or disclosure. If you are not the intended recipient, you are strictly prohibited from reviewing, disclosing, copying using or disseminating any of this information or taking any action in reliance on or regarding this information. If you have received this fax in error, please notify us immediately by telephone so that we can arrange for its return to [place]. Phone: [phone], Toll-Free: [phone], Fax: [phone] Page: 1 of 2 Call Id: [id number] [facility] Day - Client TELEPHONE ADVICE RECORD [facility] Patient Name: Kaylee [name] Gender: Female DOB: April 14, 1961 Age: 54 Y 10 M 19 D Return Phone Number: [phone] (Primary) Address: City/State/Zip: [place] Client [facility] Day - Client Client Site [facility] - Day Physician [name] Contact Type Call Call Type Triage / Clinical Caller Name Harsimran Sterba Relationship To Patient Self Appointment Disposition EMR Appointment Not Necessary Info pasted into Epic Yes Return Phone Number [phone] (Primary) Chief Complaint BREATHING - shortness of breath or sounds breathless Initial Comment Caller states c/o shortness of breath, pneumonia - needs nebulizer machine and Spirivia Nurse Assessment Nurse: Yetta Barre, RN, Miranda Date/Time (Eastern Time): 09/27/2014 1:16:56 PM Confirm and document reason for call. If symptomatic, describe symptoms. ---Caller states she was seen by her Cardiologist and was told she had Bronchitis/Bronchial Pneumonia and prescribed Zpac 1 month ago. Her symptoms improved and she is started feeling bad 3 days ago. She is using her cousin's  breathing machine, she does not know what the medication is that is not in the treatment. When I told caller she should never take medication that she does not know what it is. She had previously also been taking left over antibiotics that were prescribed for her cousin. She stated she had to have something to be able to breath and her inhaler was not working. I told her if she gets into that situation not to take someone else's medication but go to the ED. She states she will go to the ED if she does not get better. She was just needing a nebulizer and wanted Spiriva called in. She hung up. Did give me the opportunity to triage her symptoms. Has the patient traveled out of the country within the last 30 days? ---No Does the patient require triage? ---Declined Triage Guidelines Guideline Title Affirmed Question Affirmed Notes Nurse Date/Time (Eastern Time) Disp. Time Lamount Cohen Time) Disposition Final User 09/27/2014 1:15:25 PM Send to Urgent Darrick Penna, Amy 09/27/2014 1:34:21 PM Clinical Call Yes Yetta Barre, RN, Miranda PLEASE NOTE: All timestamps contained within this report are represented as [place] Standard Time. CONFIDENTIALTY NOTICE: This fax transmission is intended only for the addressee. It contains information that is legally privileged, confidential or otherwise protected from use or disclosure. If you are not the intended recipient, you are strictly prohibited from reviewing, disclosing, copying using or disseminating any of this information or taking any action in reliance on or regarding this information. If you have received this fax in error, please notify us immediately by telephone so that we can arrange for its return to [place]. Phone: [phone], Toll-Free: [phone], Fax: [phone] Page: 2 of 2 Call Id: [id number] After Care Instructions Given Call Event Type User Date / Time Description

## 2014-09-27 NOTE — Telephone Encounter (Signed)
Patient Name: Kaylee [name] DOB: [birth year] Initial Comment Caller states c/o shortness of breath, pneumonia - needs nebulizer machine and Spirivia Nurse Assessment Nurse: Yetta Barre, RN, Miranda Date/Time (Eastern Time): 09/27/2014 1:16:56 PM Confirm and document reason for call. If symptomatic, describe symptoms. ---Caller states she was seen by her Cardiologist and was told she had Bronchitis/Bronchial Pneumonia and prescribed Zpac 1 month ago. Her symptoms improved and she is started feeling bad 3 days ago. She is using her cousin's breathing machine, she does not know what the medication is that is not in the treatment. When I told caller she should never take medication that she does not know what it is. She had previously also been taking left over antibiotics that were prescribed for her cousin. She stated she had to have something to be able to breath and her inhaler was not working. I told her if she gets into that situation not to take someone else's medication but go to the ED. She states she will go to the ED if she does not get better. She was just needing a nebulizer and wanted Spiriva called in. She hung up. Did give me the opportunity to triage her symptoms. Has the patient traveled out of the country within the last 30 days? ---No Does the patient require triage? ---Declined Triage

## 2014-10-04 ENCOUNTER — Other Ambulatory Visit: Payer: Self-pay | Admitting: Family Medicine

## 2014-10-04 DIAGNOSIS — N390 Urinary tract infection, site not specified: Secondary | ICD-10-CM | POA: Diagnosis not present

## 2014-10-04 NOTE — Telephone Encounter (Signed)
Ok to refill 

## 2014-10-04 NOTE — Telephone Encounter (Signed)
endocet printed. plz phone in other meds.

## 2014-10-06 NOTE — Telephone Encounter (Signed)
Rx called in to pharmacy. 

## 2014-10-08 DIAGNOSIS — Z79899 Other long term (current) drug therapy: Secondary | ICD-10-CM | POA: Diagnosis not present

## 2014-10-08 DIAGNOSIS — M25562 Pain in left knee: Secondary | ICD-10-CM | POA: Diagnosis not present

## 2014-10-08 DIAGNOSIS — M79641 Pain in right hand: Secondary | ICD-10-CM | POA: Diagnosis not present

## 2014-10-08 DIAGNOSIS — M25561 Pain in right knee: Secondary | ICD-10-CM | POA: Diagnosis not present

## 2014-10-08 DIAGNOSIS — M79642 Pain in left hand: Secondary | ICD-10-CM | POA: Diagnosis not present

## 2014-10-08 DIAGNOSIS — M1990 Unspecified osteoarthritis, unspecified site: Secondary | ICD-10-CM | POA: Diagnosis not present

## 2014-10-08 NOTE — Telephone Encounter (Signed)
Pt let v/m requesting cb that all meds requested were called in to [facility] and pt request rx endocet. Do not see endocet rx at front desk or where signed for.

## 2014-10-09 NOTE — Telephone Encounter (Signed)
Patient notified and Rx placed up front for pick up. FYI-patient wants to discuss pulmonology referral at her follow up in April. She says the Enbrel has messed up her lungs and Dr. Gavin Potters suggests she goes to see one. She wanted you to be thinking about this prior to her appt.

## 2014-10-10 ENCOUNTER — Telehealth: Payer: Self-pay | Admitting: *Deleted

## 2014-10-10 NOTE — Telephone Encounter (Signed)
PA's for Lansoprazole and Hyoscyamine in your IN for completion.

## 2014-10-12 NOTE — Telephone Encounter (Signed)
Both denied. In your IN box for review.

## 2014-10-12 NOTE — Telephone Encounter (Signed)
filled and in kim's box.

## 2014-10-12 NOTE — Telephone Encounter (Signed)
PA's faxed. Will await determination.

## 2014-10-14 NOTE — Telephone Encounter (Addendum)
Will discuss at OV. Pt will need to pay out of pocket or go back to bentyl. Re PPI - will need trial of different one first (nexium, omeprazole or protonix)

## 2014-10-15 ENCOUNTER — Encounter: Payer: Self-pay | Admitting: Family Medicine

## 2014-10-22 DIAGNOSIS — M1990 Unspecified osteoarthritis, unspecified site: Secondary | ICD-10-CM | POA: Diagnosis not present

## 2014-10-22 DIAGNOSIS — M150 Primary generalized (osteo)arthritis: Secondary | ICD-10-CM | POA: Diagnosis not present

## 2014-10-26 ENCOUNTER — Telehealth: Payer: Self-pay

## 2014-10-26 ENCOUNTER — Encounter: Payer: Self-pay | Admitting: Family Medicine

## 2014-10-26 ENCOUNTER — Ambulatory Visit (INDEPENDENT_AMBULATORY_CARE_PROVIDER_SITE_OTHER): Payer: Medicare Other | Admitting: Family Medicine

## 2014-10-26 VITALS — BP 124/82 | HR 105 | Temp 98.0°F | Wt 188.0 lb

## 2014-10-26 DIAGNOSIS — E785 Hyperlipidemia, unspecified: Secondary | ICD-10-CM | POA: Diagnosis not present

## 2014-10-26 DIAGNOSIS — K581 Irritable bowel syndrome with constipation: Secondary | ICD-10-CM

## 2014-10-26 DIAGNOSIS — K589 Irritable bowel syndrome without diarrhea: Secondary | ICD-10-CM | POA: Diagnosis not present

## 2014-10-26 DIAGNOSIS — M069 Rheumatoid arthritis, unspecified: Secondary | ICD-10-CM

## 2014-10-26 DIAGNOSIS — K219 Gastro-esophageal reflux disease without esophagitis: Secondary | ICD-10-CM | POA: Diagnosis not present

## 2014-10-26 MED ORDER — PANTOPRAZOLE SODIUM 40 MG PO TBEC: 40.0000 mg | DELAYED_RELEASE_TABLET | Freq: Every day | ORAL | Status: DC

## 2014-10-26 MED ORDER — FISH OIL 1200 MG PO CAPS: 2.0000 | ORAL_CAPSULE | Freq: Every day | ORAL | Status: DC

## 2014-10-26 MED ORDER — POLYETHYLENE GLYCOL 3350 17 GM/SCOOP PO POWD: ORAL | Status: DC

## 2014-10-26 MED ORDER — OMEGA-3-ACID ETHYL ESTERS 1 G PO CAPS: 2.0000 g | ORAL_CAPSULE | Freq: Every day | ORAL | Status: DC

## 2014-10-26 NOTE — Assessment & Plan Note (Addendum)
Continue fish oil. Refilled today per pt request.

## 2014-10-26 NOTE — Progress Notes (Signed)
Pre visit review using our clinic review tool, if applicable. No additional management support is needed unless otherwise documented below in the visit note. 

## 2014-10-26 NOTE — Telephone Encounter (Signed)
We discussed at office visit and pt requested OTC fish oil script at that time. Will trial lovaza but likely will be too expensive.

## 2014-10-26 NOTE — Patient Instructions (Signed)
Look into gas X or beano. I'm glad you're doing well today. We will try protonix instead of prevacid. I've refilled fish oil tablets.

## 2014-10-26 NOTE — Progress Notes (Signed)
BP 124/82 mmHg  Pulse 105  Temp(Src) 98 F (36.7 C) (Oral)  Wt 188 lb (85.276 kg)  SpO2 96%   CC: 4 mo f/u visit  Subjective:    Patient ID: Kaylee [name], female    DOB: [birth year], 54 y.o.   MRN: [id number]  HPI: Kaylee [name] is a 54 y.o. female presenting on 10/26/2014 for Follow-up   I reviewed latest note from Dr Gavin Potters on 10/22/2014. No significant improvement on etodolac, so plaquenil was started. Thought osteoarthritis with possible inflammatory component but no significant synovitis.enbrel prior caused recurrent pulmonary infections. rec f/u in 2 months.   GERD - on prevacid but not covered by insurance. Prior tried omeprazole and this was stopped with concerns for kidney damage.   Notes marked gassiness and distension worse with milk of beer. Has been taking bentyl 10-20mg  twice daily as needed.  Relevant past medical, surgical, family and social history reviewed and updated as indicated. Interim medical history since our last visit reviewed. Allergies and medications reviewed and updated. Current Outpatient Prescriptions on File Prior to Visit  Medication Sig  . albuterol (PROVENTIL HFA;VENTOLIN HFA) 108 (90 BASE) MCG/ACT inhaler Inhale 2 puffs into the lungs every 6 (six) hours as needed.  . ALPRAZolam (XANAX) 1 MG tablet TAKE ONE TABLET TWICE DAILY  . aspirin 81 MG tablet Take 81 mg by mouth daily.  [name] b complex vitamins tablet Take 1 tablet by mouth daily.  [name] BIOTIN PO Take by mouth daily.  . calcium-vitamin D (OSCAL) 250-125 MG-UNIT per tablet Take 2 tablets by mouth daily.  . Cyanocobalamin (B-12) 1000 MCG CAPS Take 1 capsule by mouth daily.  . diazepam (VALIUM) 10 MG tablet TAKE ONE TABLET TWICE DAILY  . dicyclomine (BENTYL) 10 MG capsule Take 10 mg by mouth every 6 (six) hours.  . ENDOCET 5-325 MG per tablet TAKE ONE (1) TABLET FOUR TIMES A DAY AS NEEDED FOR MODERATE PAIN  . EPINEPHrine 0.3 mg/0.3 mL IJ SOAJ injection Inject 0.3 mg into the muscle once.  For quinolone reaction  . etodolac (LODINE) 400 MG tablet TAKE 1 TABLET BY MOUTH 2 TIMES DAILY  . fluconazole (DIFLUCAN) 150 MG tablet Take 1 tablet (150 mg total) by mouth once. Repeat in 1 week  . furosemide (LASIX) 20 MG tablet Take 1 tablet (20 mg total) by mouth daily as needed.  . Multiple Vitamin (MULTIVITAMIN) tablet Take 1 tablet by mouth daily.  . Multiple Vitamins-Minerals (PRESERVISION AREDS PO) Take by mouth 2 (two) times daily.  [name] neomycin-polymyxin b-dexamethasone (MAXITROL) 3.5-10000-0.1 SUSP PUT ONE DROP INTO THE LEFT EYE AS NEEDED  . neomycin-polymyxin-hydrocortisone (CORTISPORIN) otic solution Place 3 drops into both ears 4 (four) times daily.  . nitrofurantoin (MACRODANTIN) 100 MG capsule TAKE ONE (1) CAPSULE EACH DAY FOR UTI  . nystatin (MYCOSTATIN) 100000 UNIT/ML suspension Take 5 mLs (500,000 Units total) by mouth 4 (four) times daily. Swish and swallow  . potassium chloride (K-DUR) 10 MEQ tablet Take 1 tablet (10 mEq total) by mouth daily as needed.  [name] tiZANidine (ZANAFLEX) 4 MG tablet TAKE ONE TABLET AT BEDTIME   No current facility-administered medications on file prior to visit.    Review of Systems Per HPI unless specifically indicated above     Objective:    BP 124/82 mmHg  Pulse 105  Temp(Src) 98 F (36.7 C) (Oral)  Wt 188 lb (85.276 kg)  SpO2 96%  Wt Readings from Last 3 Encounters:  10/26/14 188 lb (85.276 kg)  08/30/14  191 lb 8 oz (86.864 kg)  06/25/14 191 lb (86.637 kg)    Physical Exam  Constitutional: She appears well-developed and well-nourished. No distress.  HENT:  Mouth/Throat: Oropharynx is clear and moist. No oropharyngeal exudate.  Eyes: Conjunctivae and EOM are normal. Pupils are equal, round, and reactive to light. No scleral icterus.  Neck: Normal range of motion. Neck supple.  Cardiovascular: Normal rate, regular rhythm, normal heart sounds and intact distal pulses.   No murmur heard. Pulmonary/Chest: Effort normal and breath  sounds normal. No respiratory distress. She has no wheezes. She has no rales.  Musculoskeletal: She exhibits no edema.  Skin: Skin is warm and dry. No rash noted.  Psychiatric: She has a normal mood and affect.  Nursing note and vitals reviewed.  Results for orders placed or performed in visit on 06/25/14  Hepatic Function Panel  Result Value Ref Range   Total Bilirubin 0.6 0.2 - 1.2 mg/dL   Bilirubin, Direct 0.0 0.0 - 0.3 mg/dL   Alkaline Phosphatase 55 39 - 117 U/L   AST 23 0 - 37 U/L   ALT 26 0 - 35 U/L   Total Protein 6.6 6.0 - 8.3 g/dL   Albumin 4.1 3.5 - 5.2 g/dL  Vitamin T62  Result Value Ref Range   Vitamin B-12 314 211 - 911 pg/mL      Assessment & Plan:   Problem List Items Addressed This Visit    Rheumatoid arthritis - Primary    Vs osteoarthritis - started on plaquenil by rheumatology. Continue plaquenil, etodolac, oxycodone prn breakthrough pain, and f/u with rheum.      Relevant Medications   hydroxychloroquine (PLAQUENIL) 200 MG tablet   predniSONE (DELTASONE) 5 MG tablet   Irritable bowel syndrome with constipation    Insurance would not cover hyoscyamine. Discussed bentyl use. rec gas X or beano with meals for bloating.      Relevant Medications   pantoprazole (PROTONIX) 40 MG tablet   polyethylene glycol powder (GLYCOLAX/MIRALAX) powder   HLD (hyperlipidemia)    Continue fish oil. Refilled today per pt request.      GERD (gastroesophageal reflux disease)    Prevacid no longer covered by insurance - pt was taking once daily. Will change to protonix 40mg  daily. Prior omeprazole was stopped 2/2 concern for kidney insufficiency? (years ago)      Relevant Medications   pantoprazole (PROTONIX) 40 MG tablet   polyethylene glycol powder (GLYCOLAX/MIRALAX) powder       Follow up plan: Return in about 4 months (around 02/25/2015), or if symptoms worsen or fail to improve, for annual exam, prior fasting for blood work.

## 2014-10-26 NOTE — Assessment & Plan Note (Addendum)
Vs osteoarthritis - started on plaquenil by rheumatology. Continue plaquenil, etodolac, oxycodone prn breakthrough pain, and f/u with rheum.

## 2014-10-26 NOTE — Assessment & Plan Note (Signed)
Prevacid no longer covered by insurance - pt was taking once daily. Will change to protonix 40mg  daily. Prior omeprazole was stopped 2/2 concern for kidney insufficiency? (years ago)

## 2014-10-26 NOTE — Telephone Encounter (Signed)
[facility] left v/m; the fish oil that was sent in today was OTC med and [facility] thinks pt want Lovaza 1gm,Please advise.

## 2014-10-26 NOTE — Assessment & Plan Note (Signed)
Insurance would not cover hyoscyamine. Discussed bentyl use. rec gas X or beano with meals for bloating.

## 2014-11-02 ENCOUNTER — Other Ambulatory Visit: Payer: Self-pay | Admitting: Family Medicine

## 2014-11-02 NOTE — Telephone Encounter (Signed)
Ok to refill in Dr. G's absence? 

## 2014-11-02 NOTE — Telephone Encounter (Signed)
Rxs called in as prescribed and left voicemail letting her know pain med ready for pick-up

## 2014-11-02 NOTE — Telephone Encounter (Signed)
Ok to fill once in PCP absence  I will print the pain med Please call in the others

## 2014-11-03 NOTE — H&P (Signed)
PATIENT NAME:  Kaylee [name], Kaylee [name] A MR#:  [id number] DATE OF BIRTH:  09/21/1960  DATE OF ADMISSION:  12/26/2013  PRIMARY CARE PHYSICIAN: Dr. [name], [facility].   CHIEF COMPLAINT: Chest pain.  Ms. Kaylee [name] is a 54 year old pleasant female with history of rheumatoid arthritis, depression, anxiety, comes in with complaints of chest pain while she was doing exercise at the gym. The patient was riding a bike in the gym, started having sharp pains, mid substernal, no radiation. Happened a couple of times, came to the Emergency Room, and is currently chest pain-free. She denies any nausea, vomiting, diaphoresis. Denies any cardiac history in the past. Her first set of cardiac enzymes are negative. EKG shows sinus bradycardia. She is being admitted for further evaluation and management.  PAST MEDICAL HISTORY:   1.  Depression. 2.  Anxiety.  3.  Frequent UTIs.  4.  Tonsillectomy.  5.  Hysterectomy.  6.  History of arthritis. The patient says she has rheumatoid arthritis and is disabled from that.  7.  History of MRSA infection in the past.   MEDICATIONS:  1.  Arthrotec 75/200, 1 tablet b.i.d.  2.  Diazepam 10 mg t.i.d.  3.  Lansoprazole 30 mg daily.  4.  Nitrofurantoin 100 mg daily.  5.  Percocet 5/325, 1 tablet 4 times a day.   FAMILY HISTORY: Positive for CAD and heart disease in grandmother.   SOCIAL HISTORY: Quit smoking about 3 years ago. Denies any alcohol or drug use.   REVIEW OF SYSTEMS:   CONSTITUTIONAL: No fever, fatigue, weakness.  EYES: No blurred or double vision, glaucoma or cataracts.  EARS, NOSE, THROAT: No tinnitus, ear pain, hearing loss or postnasal drip.  RESPIRATORY: No cough, wheeze, dyspnea or COPD. CARDIOVASCULAR: No chest pain, orthopnea, edema. No hypertension.  GASTROINTESTINAL: No nausea, vomiting, diarrhea or abdominal pain. No GERD.  GENITOURINARY: No dysuria, hematuria, frequency or incontinence.  ENDOCRINE: No polyuria,  nocturia or thyroid problems. HEMATOLOGIC: No anemia, easy bruising or bleeding.  SKIN: No acne, rash or lesion.  MUSCULOSKELETAL: Positive for arthritis. No gout or swelling.  NEUROLOGIC: No CVA, TIA, dementia.  PSYCHIATRIC: Positive for anxiety and depression. No bipolar disorder.   All other systems reviewed and negative.   PHYSICAL EXAMINATION:  GENERAL: The patient is awake, alert, oriented x 3, not in acute distress.  VITAL SIGNS: Afebrile. Pulse is 66. Blood pressure is 135/70. Sats are 100% on room air.  HEENT: Atraumatic, normocephalic. Pupils: PERRLA. EOMI intact. Oral mucosa is moist.  NECK: Supple. No JVD. No carotid bruits.  RESPIRATORY: Clear to auscultation bilaterally. No rales, rhonchi, respiratory distress or labored breathing. CARDIOVASCULAR: Both the heart sounds are normal. Rate, rhythm regular. PMI not lateralized. Chest nontender. Good pedal pulses, good femoral pulses. No lower extremity edema.  ABDOMEN: Soft, benign, nontender. No organomegaly. Positive bowel sounds. NEUROLOGIC: Grossly intact cranial nerves II through XII.  No motor or sensory deficits. PSYCHIATRIC: The patient is awake, alert, oriented x 3.   LABORATORY, DIAGNOSTIC, AND RADIOLOGICAL DATA: Chest x-ray: No acute cardiopulmonary abnormality. Troponin is 0.05. CBC within normal limits. Basic metabolic panel within normal limits. EKG shows sinus bradycardia without any ST elevation or depression.   ASSESSMENT AND PLAN: Kaylee [name] is a 54 year old Caucasian female with history of arthritis, depression, anxiety, comes in with:  1.  Chest pain, rule out myocardial infarction: Cardiac enzymes first set is negative. EKG shows sinus bradycardia. She has no risk factor but family history and history of smoking remotely  in the past. Will admit her for overnight observation. Cycle cardiac enzymes x 3. Check lipid profile. Give aspirin, nitroglycerin p.r.n. Will schedule the patient tomorrow for a treadmill  stress test.  2.  Arthritis: Continue her home medications, which are diazepam and p.r.n. Percocet.  3.  Anxiety, depression: Continue Valium 10 mg t.i.d.  4.  Frequent urinary tract infection: The patient is on chronic urinary tract infection prophylaxis with nitrofurantoin.  5.  Deep venous thrombosis prophylaxis: Subcutaneous heparin t.i.d.   CODE STATUS: Full code.   TIME SPENT: 50 minutes.   ____________________________ Wylie Hail Allena Katz, MD sap:jcm D: 12/26/2013 20:02:16 ET T: 12/26/2013 20:20:37 ET JOB#: [id number]  cc: Sona A. Allena Katz, MD, <Dictator> [name], MD Willow Ora MD ELECTRONICALLY SIGNED 01/13/2014 10:26

## 2014-11-03 NOTE — Discharge Summary (Signed)
PATIENT NAME:  Kaylee Kaylee [name], Kaylee Kaylee [name] MR#:  [id number] DATE OF BIRTH:  [birth year]  DATE OF ADMISSION:  03/07/2014 DATE OF DISCHARGE:  03/08/2014   DISCHARGE DIAGNOSES:  1.  Chest pain, likely noncardiac, with negative cardiac catheterization. Chest pain is probably from anxiety.  2.  Anxiety. Medication adjusted, and the patient is feeling Kaylee [name] whole lot better.   SECONDARY DIAGNOSES:  1.  Recurrent urinary tract infection.  2.  Depression.  3.  Anxiety.  4.  Rheumatoid arthritis.  5.  History of methicillin-resistant Staphylococcus aureus.   CONSULTATION: Cardiology, Dr. Julien Nordmann.   PROCEDURES AND RADIOLOGY:  1.  Cardiac catheterization by Dr. Julien Nordmann on the 27th of August showed no occlusive coronary artery disease. Normal LVEF with no significant mitral regurgitation or aortic valve stenosis.  2.  Chest x-ray on the 26th of August showed no acute findings.   HISTORY AND SHORT HOSPITAL COURSE: The patient is Kaylee [name] 54 year old female with the above-mentioned medical problems who was admitted for chest pain. Please see Dr. Nicky Pugh dictated history and physical for further details. Cardiology consultation was obtained with Dr. Mariah Milling concerning the patient's persistence for getting cardiac catheterization and her atypical anginal symptoms. Cardiac catheterization was performed, which was essentially negative, after which the patient was agreeable for adjustment of anxiety medications, which was done, and the patient was discharged home as she remained chest pain-free. On the date of discharge, her vital signs were as follows: Temperature 98.2, heart rate 68 per minute, respirations 14 per minute, blood pressure 115/68 mmHg, saturating 100% on room air.   PERTINENT PHYSICAL EXAMINATION ON THE DATE OF DISCHARGE:  CARDIOVASCULAR: S1, S2 normal. No murmurs, rubs, or gallop.  LUNGS: Clear to auscultation bilaterally. No wheezing, rales, rhonchi, or crepitation.  ABDOMEN: Soft, benign.   NEUROLOGIC: Nonfocal examination. All other physical examination remained at baseline.   DISCHARGE MEDICATIONS:  1.  Percocet 5/325 mg 1 tablet p.o. every 6 hours.  2.  Lansoprazole 30 mg p.o. daily.  3.  Nitrofurantoin 100 mg p.o. daily.  4.  Aspirin 81 mg p.o. daily.  5.  Fish oil 1200 mg p.o. b.i.d.  6.  Prednisone 5 mg p.o. 2 tablets daily.  7.  Sinus Congestion and Pain 1 dose p.o. at bedtime as needed.  8.  Multivitamin once daily.  9.  B complex once daily.  10.  Zicam Nasal Spray 2 sprays intranasally 2 times Kaylee [name] day as needed.  11.  Zanaflex 4 mg p.o. at bedtime.  12.  Hyoscyamine 0.125 mg p.o. 4 times Kaylee [name] day as needed.  13.  Maxitrol ophthalmic suspension 1 drop to each affected eye every 4 hours as needed.  14.  Cortisporin otic solution 3 drops to each affected ear 4 times Kaylee [name] day.  15.  Enbrel prefilled syringe subcutaneous 1 dose once Kaylee [name] week.  16.  Diazepam 10 mg 1 tablet p.o. b.i.d. alternating with Xanax.  17.  Xanax 1 mg p.o. b.i.d. alternating with Valium.  DISCHARGE DIET: Low-sodium, low-fat, low-cholesterol.   DISCHARGE ACTIVITY: As tolerated.   DISCHARGE INSTRUCTIONS  AND FOLLOWUP: The patient was instructed to follow up with her primary care physician, Dr. Eustaquio [name], in 1-2 weeks. She will need followup with [facility] cardiology in 2-4 weeks.   TOTAL TIME DISCHARGING THIS PATIENT: 45 minutes.    ____________________________ Ellamae Sia. Sherryll Burger, MD vss:MT D: 03/08/2014 23:27:46 ET T: 03/09/2014 06:33:05 ET JOB#: [id number]  cc: Kaylee Kost S. Sherryll Burger, MD, <Dictator> Kaylee Boyden, MD Kaylee Iba, MD  Kaylee Kaylee [name]  Kaylee Eddy MD ELECTRONICALLY SIGNED 03/09/2014 12:06

## 2014-11-03 NOTE — Consult Note (Signed)
General Aspect 54 year old Caucasian female with a history of chest pain, recurrent UTI, rheumatoid arthritis comes to ED due to chest pain for the past 3 days. Cardiology was consulted for angina.  The patient has had chest pain in the substernal area, stuttering for the past few days, worse yesterday. She described it as an ax in her chest. Also with tachycardia and palpitations. She called 911 and was given multiple NTG with relief of her pain. She described the pain as tight, heavy, and sometimes sharp, without radiation. The patient also complains of diaphoresis, shortness of breath. she denies orthopnea, nocturnal dyspnea. No leg edema. The patient denies any other symptoms.  The patient had chest pain 3 months ago, was hospitalized, and got a stress test which was negative.  Chest pain sx have been stuttering since then. She is concerned about underlying CAD. Father had premature CAD adn CABG.    PAST MEDICAL HISTORY:  Frequent UTI, depression, anxiety, rheumatoid arthritis. MRSA infection in the past.  PAST SURGICAL HISTORY:  Tonsillectomy, hysterectomy.  SOCIAL HISTORY:  Quit smoking 4 years ago, drinks beer, 1 can daily; no drug abuse.   FAMILY HISTORY:  Father had a heart valve replacement, hypertension runs in her family, and also CAD, heart disease in grandmother.   ALLERGIES:  AVELOX, HYDROCODONE CP , METHADONE, MORPHINE, QUINOLONES ANTIBIOTICS, SULFA DRUGS, TORADOL.   Physical Exam:  GEN well developed, well nourished, no acute distress   HEENT hearing intact to voice, moist oral mucosa   NECK supple   RESP normal resp effort  clear BS   CARD Regular rate and rhythm  No murmur   ABD denies tenderness  soft   LYMPH negative neck   EXTR negative edema   SKIN normal to palpation   NEURO motor/sensory function intact   PSYCH alert, A+O to time, place, person, good insight   Review of Systems:  Subjective/Chief Complaint Chest pain, diaphoresis,  palpitations   Skin: No Complaints   ENT: No Complaints   Eyes: No Complaints   Neck: No Complaints   Respiratory: No Complaints   Cardiovascular: Chest pain or discomfort  Tightness  Palpitations   Gastrointestinal: No Complaints   Genitourinary: No Complaints   Vascular: No Complaints   Musculoskeletal: No Complaints   Neurologic: No Complaints   Hematologic: No Complaints   Endocrine: No Complaints   Psychiatric: No Complaints   Review of Systems: All other systems were reviewed and found to be negative   Medications/Allergies Reviewed Medications/Allergies reviewed   Family & Social History:  Family and Social History:  Family History Coronary Artery Disease  Smoking   Social History positive  tobacco   + Tobacco Prior (greater than 1 year)   Place of Living Home     MRSA:    arthritis:    anxiety:    depression:    frequent uti's:    Hypoglycemia:    left thumb surg:    tonsillectomy:    hysterectomy:        Admit Diagnosis:   CHEST PAIN..: Onset Date: 08-Mar-2014, Status: Active, Description: CHEST PAIN.[name]  Home Medications: Medication Instructions Status  Fish Oil 1200 mg oral capsule 1 cap(s) orally 2 times a day Active  aspirin 81 mg oral delayed release tablet 1 tab(s) orally once a day Active  Percocet 5/325 325 mg-5 mg oral tablet 1 tab(s) orally 4 times a day Active  lansoprazole 30 mg oral delayed release capsule 1 cap(s) orally once a day Active  nitrofurantoin macrocrystals 100 mg oral capsule 1 cap(s) orally once a day Active  diazepam 10 mg oral tablet 1 tab(s) orally 3 times a day Active  predniSONE 5 mg oral tablet 2 tab(s) orally once a day Active  Sinus Congestion & Pain Nighttime 1 dose(s) orally once a day (at bedtime), As Needed Active  multivitamin 1 tab(s) orally once a day Active  B Complex 50 1 tab(s) orally once a day Active  Zicam Sinus Relief 0.05% nasal spray 2 spray(s) nasal 2 times a day, As Needed  Active  Zanaflex 4 mg oral tablet 1 tab(s) orally once a day (at bedtime) Active  hyoscyamine 0.125 mg oral tablet 1 tab(s) orally 4 times a day, As Needed Active  Maxitrol 1 mg-3.5 mg-10000 units/mL ophthalmic suspension 1 drop(s) to each affected eye every 4 hours, As Needed Active  Cortisporin Otic 1%-0.35%-10000 units/mL otic solution 3 drop(s) to each affected ear 4 times a day Active  Enbrel Prefilled Syringe 1 dose(s) subcutaneous once a week Active   Lab Results:  Cardiology:  26-Aug-15 13:36   Ventricular Rate 61  Atrial Rate 61  P-R Interval 154  QRS Duration 86  QT 428  QTc 430  P Axis 42  R Axis 45  T Axis 27  ECG interpretation Normal sinus rhythm with sinus arrhythmia Normal ECG When compared with ECG of 26-Dec-2013 15:09, No significant change was found ----------unconfirmed---------- Confirmed by OVERREAD, NOT ([id number]), editor PEARSON, BARBARA ([id number]) on 03/08/2014 9:24:40AM  Cardiac:  26-Aug-15 13:37   Troponin I 0.03 (0.00-0.05 0.05 ng/mL or less: NEGATIVE  Repeat testing in 3-6 hrs  if clinically indicated. >0.05 ng/mL: POTENTIAL  MYOCARDIAL INJURY. Repeat  testing in 3-6 hrs if  clinically indicated. NOTE: An increase or decrease  of 30% or more on serial  testing suggests a  clinically important change)    19:14   Troponin I 0.03 (0.00-0.05 0.05 ng/mL or less: NEGATIVE  Repeat testing in 3-6 hrs  if clinically indicated. >0.05 ng/mL: POTENTIAL  MYOCARDIAL INJURY. Repeat  testing in 3-6 hrs if  clinically indicated. NOTE: An increase or decrease  of 30% or more on serial  testing suggests a  clinically important change)    22:53   Troponin I 0.02 (0.00-0.05 0.05 ng/mL or less: NEGATIVE  Repeat testing in 3-6 hrs  if clinically indicated. >0.05 ng/mL: POTENTIAL  MYOCARDIAL INJURY. Repeat  testing in 3-6 hrs if  clinically indicated. NOTE: An increase or decrease  of 30% or more on serial  testing suggests a  clinically important change)    EKG:  Interpretation EKG shows NSR with no significant ST or T wave changes   Radiology Results: XRay:    26-Aug-15 13:51, Chest Portable Single View  Chest Portable Single View   REASON FOR EXAM:    chest pain  COMMENTS:       PROCEDURE: DXR - DXR PORTABLE CHEST SINGLE VIEW  - Mar 07 2014  1:51PM     CLINICAL DATA:  Worsening chest pain, shortness of breath.    EXAM:  PORTABLE CHEST - 1 VIEW    COMPARISON:  12/26/2013.    FINDINGS:  Trachea is midline. Heart size normal. Lungs are somewhat low in  volume but clear. No pleural fluid.   IMPRESSION:  No acute findings.      Electronically Signed    By: Leanna Battles M.D.    On: 03/07/2014 13:57         Verified By: Luz Brazen. BLIETZ,  M.D.,    Sulfa drugs: Wheezing  Methadone: Alt Ment Status  Hydrocodone CP: Rash  Toradol: Other  Morphine: Other  Avelox: Angioedema, Resp. Distress, Itching, Hives, Anxiety  Quinolones (fluoroquinolone) antibiotics: Unknown  Vital Signs/Nurse's Notes: **Vital Signs.:   27-Aug-15 08:18  Temperature Temperature (F) 97.8  Celsius 36.5  Temperature Source oral  Pulse Pulse 59  Respirations Respirations 20  Systolic BP Systolic BP 98  Diastolic BP (mmHg) Diastolic BP (mmHg) 66  Mean BP 76  Pulse Ox % Pulse Ox % 100  Pulse Ox Activity Level  At rest  Oxygen Delivery 2L; Nasal Cannula    Impression 54 year old Caucasian female with a history of chest pain, recurrent UTI, rheumatoid arthritis comes to ED due to chest pain for the past 3 days. Cardiology was consulted for angina.  1) Angina: recurrent sx over the past year, second admission to teh hospital for similar sx. Previous stress test 3 months ago with no ischemia, normal EF, now readmitted again. enz and EKG normal --long discussion with the patient about her various treatment options. Given her strong family hx, she prefers a definative evaluation with cardiac cath --Cardiac cath scheduled today hold b-blociker  for bradycardia, contineu asa, will hold heparin for cardiac cath, continue statin BP low, hodl NTG paste and infusion --If cath negative, she may need a 48 hour or 30 day monitor for arrhythmia  2)  Anxiety.  would continue outpt meds if cath negative and no CAD, sx could be secondary to anxiety  3.  Rheumatoid arthritis. Continue outpt meds   Electronic Signatures: Julien Nordmann (MD)  (Signed 27-Aug-15 10:06)  Authored: General Aspect/Present Illness, History and Physical Exam, Review of System, Family & Social History, Past Medical History, Health Issues, Home Medications, Labs, EKG , Radiology, Allergies, Vital Signs/Nurse's Notes, Impression/Plan   Last Updated: 27-Aug-15 10:06 by Julien Nordmann (MD)

## 2014-11-03 NOTE — H&P (Signed)
PATIENT NAME:  Kaylee [name], Kaylee [name] A MR#:  [id number] DATE OF BIRTH:  [birth year]  DATE OF ADMISSION:  03/07/2014  PRIMARY CARE PHYSICIAN:  Not local.    REFERRING PHYSICIAN:  Gwynneth Albright, MD    CHIEF COMPLAINT: Chest pain for 3 days.   HISTORY OF PRESENT ILLNESS: A 54 year old Caucasian female with a history of chest pain, recurrent UTI, rheumatoid arthritis comes to ED due to chest pain for the past 3 days. The patient is alert, awake, oriented in no acute distress. The patient has had chest pain in the substernal area, which is indeterminant, tight, heavy, and sometimes sharp, without radiation. The patient also complains of diaphoresis, shortness of breath and palpitations, but she denies orthopnea, nocturnal dyspnea. No leg edema. The patient denies any other symptoms. The patient had chest pain 3 months ago, was hospitalized, and got a stress test which was negative.   PAST MEDICAL HISTORY: Frequent UTI, depression, anxiety, rheumatoid arthritis. MRSA infection in the past.  PAST SURGICAL HISTORY: Tonsillectomy, hysterectomy. SOCIAL HISTORY: Quit smoking 4 years ago, drinks beer, 1 can daily; no drug abuse.   FAMILY HISTORY: Father had a heart valve replacement, hypertension runs in her family, and also CAD, heart disease in grandmother.   ALLERGIES: AVELOX, HYDROCODONE CP , METHADONE, MORPHINE, QUINOLONES ANTIBIOTICS, SULFA DRUGS, TORADOL.   HOME MEDICATIONS:  Medication reconciliation list is not done yet; for now:  1.  Percocet 5/325 mg 1 tablet 4 times a day.  2.  Nitrofurantoin 1000 mg p.o. capsule 1 cap once a day.  3.  Lansoprazole 30 mg p.o. daily.  4.  Fish oil 1200 mg p.o. b.i.d.  5.  Diazepam 10 mg p.o. t.i.d. 6.  Aspirin 81 mg p.o. daily.  7.  Arthrotec 75 mg/200 mcg tablets b.i.d.  8.  Augmentin 875 mg p.o. b.i.d.   REVIEW OF SYSTEMS: CONSTITUTIONAL: The patient denies any fever or chills, no headache, or dizziness.  EYES: No double vision, blurred vision.  ENT:  No postnasal drip, slurred speech, or dysphagia.  CARDIOVASCULAR: Positive for chest pain, palpitations; no orthopnea, nocturnal dyspnea, no leg edema.  PULMONARY: No cough, sputum, shortness of breath, or hemoptysis.  GASTROINTESTINAL: No abdominal pain, nausea, vomiting, or diarrhea. No melena, or bloody stools.  GENITOURINARY:  No dysuria, hematuria, or incontinence.  SKIN: No rash, or jaundice.  NEUROLOGIC: No syncope, loss of consciousness, or seizure.  HEMATOLOGY: No easy bruising, bleeding.  ENDOCRINE: No polyuria, polydipsia, heat, or cold intolerance.  PSYCHIATRIC: No depression, but has anxiety.   VITAL SIGNS: Temperature 98.1, blood pressure 112/95, pulse 60, O2 saturation 97% on oxygen.   PHYSICAL EXAMINATION:  GENERAL: The patient is alert, awake, oriented, in no acute distress.  HEENT: Pupils round, equal, and reactive to light and accommodation, moist oral mucosa, clear pharynx.  NECK: Supple, no JVD or carotid bruits, no lymphadenopathy; no thyromegaly.  CARDIOVASCULAR: S1, S2 regular rate, rhythm, no murmurs or gallops.  PULMONARY: Bilateral air entry. No wheezing or rales. No use of accessory muscle to breathe.  ABDOMEN: Soft. No distention or tenderness. No organomegaly. Bowel sounds present.  EXTREMITIES: No edema, clubbing or cyanosis. No calf tenderness. Bilateral pedal pulses present.  SKIN: No rash, or jaundice.  NEUROLOGIC: A and O x 3, no focal deficit, power 5/5, sensation intact.   LABORATORY DATA:  Chest x-ray, no acute disease,  troponin 0.03, CBC in normal range, glucose 105, BUN 19, creatinine 0.84, electrolytes normal, BNP 63, INR 1.0, EKG shows normal sinus rhythm at 61  bpm.   IMPRESSION: 1.  Unstable angina.  2.  Anxiety.  3.  Rheumatoid arthritis.  4.  Mild dehydration.   PLAN OF TREATMENT:  1.  The patient will be admitted to telemetry floor. The patient was treated with aspirin, and the Nitrol without relief. The patient is being treated with  Nitro drip and heparin drip. We will continue with drips. Since patient has had a normal stress test 3 months ago, we will not order stress test. We will request cardiology consult for possible cardiac catheterization. In addition, we will give aspirin 325 mg p.o. daily, a statin, and check a lipid panel.  2.  Continue other home medications.   I discussed the patient's condition, and plan of treatment with the patient, is full code.   TIME SPENT: About 45 minutes.    ____________________________ Shaune Pollack, MD qc:nt D: 03/07/2014 16:10:35 ET T: 03/07/2014 16:44:42 ET JOB#: [id number]  cc: Shaune Pollack, MD, <Dictator> Shaune Pollack MD ELECTRONICALLY SIGNED 03/07/2014 21:47

## 2014-11-03 NOTE — Discharge Summary (Signed)
PATIENT NAME:  Kaylee Kaylee [name], Kaylee Kaylee [name] MR#:  [id number] DATE OF BIRTH:  1961-04-29  DATE OF ADMISSION:  12/26/2013 DATE OF DISCHARGE:  12/27/2013  ADMITTING DIAGNOSIS: Chest pain.  DISCHARGE DIAGNOSES:   1.  Chest pain, noncardiac likely. 2.  Rhabdomyolysis due to exercise, resolving. 3.  Elevated troponin, likely demand ischemia.  4.  Acute sinusitis per history.  5.  Hyperlipidemia.  6.  Hypertriglyceridemia with LDL 101 and triglyceride level of 365. 7.  History of rheumatoid arthritis. 8.  Depression, anxiety. 9.  Frequent urinary tract infections.   DISCHARGE CONDITION: Stable.   DISCHARGE MEDICATIONS: The patient is to continue Percocet 5/325 mg 1 tablet 4 times daily, lansoprazole 30 mg p.o. daily, nitrofurantoin 100 mg p.o. daily, Arthrotec 75/200 mcg 1 tablet twice daily, diazepam 10 mg 3 times daily, aspirin 81 mg p.o. daily, amoxicillin/clavulanate 875 mg twice daily for 7 more days, fish oil 1200 mg twice daily.   HOME OXYGEN: None.   DIET: 2 gram salt, low-fat, low-cholesterol, regular consistency.   ACTIVITY LIMITATIONS: As tolerated.    FOLLOWUP APPOINTMENTS: With Dr. Mariah Milling in 2 days after discharge.   CONSULTANTS: Care management, social work.   RADIOLOGIC STUDIES: Chest x-ray PA and lateral, 16th of June 2015, showing no acute cardiopulmonary findings. Myoview stress test, 17th of June 2015: Pharmacological Myoview perfusion imaging study with no significant ischemia. No significant wall motion abnormality noted. The estimated ejection fraction is 51%. The left ventricular global function is normal. There are no EKG changes concerning for ischemia. There is GI uptake artifact noted on the study. Overall, this is low-risk scan.   HOSPITAL COURSE: The patient is Kaylee [name] 54 year old Caucasian female with history of rheumatoid arthritis who is on nonsteroidal anti-inflammatory medications at home, also history of depression and anxiety, presented to the hospital with complaints of  chest pain. Please refer to Dr. Jearl Klinefelter Patel's admission note on the 16th of June 2015. Apparently, the patient was exercising at the gym and suddenly started having pain in the sternal area of her chest. It was accompanied by some shortness of breath. On arrival to the hospital, her EKG did not show any significant ST or T changes. It was completely within normal limits except for bradycardia. The patient's lab data done on arrival to the hospital revealed normal BMP, normal first set of cardiac enzymes. Second set of cardiac enzymes was done also with CK total as well as MB fraction and showed marked elevation of CK total to 10,204. MB fraction was 17.1. Troponin remained stable and normal at 0.05. Third set, however, revealed lower CK total of 8226. CK-MB fraction was 12.6. However, the patient's troponin was mildly elevated at 0.06. Urine drug screen was positive for benzodiazepines as well as opiates. CBC was within normal limits.   The patient was admitted to the hospital for further evaluation. Her cardiac enzymes were cycled. She underwent pharmacological Myoview stress test, which was also unremarkable. To further delineate the risk factors for coronary artery disease, the patient also had Kaylee [name] lipid panel evaluation while she was in the hospital. LDL was found to be 101, total cholesterol was 222, triglycerides were highly elevated at 365, and HDL was 48. In view of normal Myoview stress, it was felt that the patient's overall risk for coronary artery disease is low. However, since the patient does have underlying hypertriglyceridemia, she would benefit from evaluation and further followup by Dr. Mariah Milling, cardiologist. That was discussed with him, and cardiology followup will be arranged for him for  outpatient upon discharge.   In regards to hypertriglyceridemia, the patient was initiated on fish oil. In regards to mildly elevated troponin, it was felt to be not acute coronary syndrome, but rather due to  demand ischemia.   The patient was complaining of sinus congestion and requested antibiotics, which were provided for her upon discharge. The patient is to continue antibiotics for 7 days and follow up with her primary care physician for further recommendations.   For history of rheumatoid arthritis, depression and anxiety, as well as frequency urinary tract infections, the patient is to continue her outpatient management.   The patient is being discharged in stable condition with the above-mentioned medications and follow-up. Her vital signs on the day of discharge: Temperature was 97.8, pulse was 65, respiratory rate was 18, blood pressure 124/82, saturation was 99% on room air at rest.   TIME SPENT: 40 minutes.   ____________________________ Katharina Caper, MD rv:jcm D: 12/27/2013 19:36:01 ET T: 12/27/2013 21:14:40 ET JOB#: [id number]  cc: Katharina Caper, MD, <Dictator> Antonieta Iba, MD RIMA VAICKUTE MD ELECTRONICALLY SIGNED 01/06/2014 17:20

## 2014-11-05 ENCOUNTER — Other Ambulatory Visit: Payer: Self-pay | Admitting: Family Medicine

## 2014-11-05 ENCOUNTER — Encounter: Payer: Self-pay | Admitting: Family Medicine

## 2014-11-05 ENCOUNTER — Telehealth (INDEPENDENT_AMBULATORY_CARE_PROVIDER_SITE_OTHER): Payer: Medicare Other | Admitting: *Deleted

## 2014-11-05 DIAGNOSIS — R300 Dysuria: Secondary | ICD-10-CM | POA: Diagnosis not present

## 2014-11-05 DIAGNOSIS — Z79891 Long term (current) use of opiate analgesic: Secondary | ICD-10-CM | POA: Diagnosis not present

## 2014-11-05 LAB — POCT URINALYSIS DIPSTICK
Blood, UA: NEGATIVE
GLUCOSE UA: NEGATIVE
KETONES UA: NEGATIVE
Nitrite, UA: NEGATIVE
PH UA: 6
Protein, UA: NEGATIVE
Urobilinogen, UA: 0.2

## 2014-11-05 NOTE — Telephone Encounter (Signed)
In for UDS and c/o UTI symptoms. UA ran and urine drawn for culture.

## 2014-11-05 NOTE — Telephone Encounter (Signed)
Patient notified by telephone that script is up front ready for pickup. 

## 2014-11-05 NOTE — Telephone Encounter (Signed)
UA/micro with LE/WBC - culture sent.

## 2014-11-08 ENCOUNTER — Other Ambulatory Visit: Payer: Self-pay | Admitting: Family Medicine

## 2014-11-09 NOTE — Telephone Encounter (Signed)
Ok to refill 

## 2014-11-11 DIAGNOSIS — T7491XA Unspecified adult maltreatment, confirmed, initial encounter: Secondary | ICD-10-CM

## 2014-11-11 HISTORY — DX: Unspecified adult maltreatment, confirmed, initial encounter: T74.91XA

## 2014-11-17 ENCOUNTER — Encounter: Payer: Self-pay | Admitting: Family Medicine

## 2014-11-21 ENCOUNTER — Other Ambulatory Visit: Payer: Self-pay | Admitting: Family Medicine

## 2014-11-28 ENCOUNTER — Telehealth: Payer: Self-pay

## 2014-11-28 NOTE — Telephone Encounter (Signed)
Pt said 3 days ago stepped off curb and pulled something in rt upper leg;on 11/27/14 was taking A/C out of window and stepped in hole and fell to ground; pt thinks tore tendon or ripped something in upper rt leg; pt said Zanaflex,prednisone,lodine and oxycodone is just helping pain slightly; pain level now is 10. Advised pt needs to be seen; pt's brakes on car are not working, pt lives alone and has not one to bring her to office. Pt wants to know if there is a different med pt might try for pain and also wants to know if should ice her leg or apply heat. Pt request cb. Medical [employer].Please advise. Pt last seen 10/26/14.

## 2014-11-28 NOTE — Telephone Encounter (Signed)
Patient advised. She said she absolutely had no transportation to come this far. She said her car was broken down and her daughter isn't speaking to her and her only other relative here is disabled. I advised that she was already taking oxycodone and anything stronger would require her to come into the office anyway to pick up a written script. She said she didn't think of that. I advised that maybe she could find transportation to an [facility] close to her house. She said she would try, otherwise she may just have to call 911 if the pain gets worse. I told her to try the ice and to stay off if it as much as possible and continue to try to find transportation either to here or [facility] to avoid the ER. She verbalized understanding.   Also, she advised she wasn't having anymore urinary symptoms.

## 2014-11-28 NOTE — Telephone Encounter (Signed)
Needs eval in office or at [facility]. rec ice to start - 20 min at a time 2-3 times daily, don't apply directly on skin.

## 2014-11-30 ENCOUNTER — Encounter: Payer: Self-pay | Admitting: Family Medicine

## 2014-11-30 ENCOUNTER — Ambulatory Visit (INDEPENDENT_AMBULATORY_CARE_PROVIDER_SITE_OTHER): Payer: Medicare Other | Admitting: Family Medicine

## 2014-11-30 VITALS — BP 132/70 | HR 65 | Temp 98.2°F | Wt 187.5 lb

## 2014-11-30 DIAGNOSIS — F411 Generalized anxiety disorder: Secondary | ICD-10-CM

## 2014-11-30 DIAGNOSIS — G894 Chronic pain syndrome: Secondary | ICD-10-CM | POA: Diagnosis not present

## 2014-11-30 DIAGNOSIS — S76311A Strain of muscle, fascia and tendon of the posterior muscle group at thigh level, right thigh, initial encounter: Secondary | ICD-10-CM

## 2014-11-30 DIAGNOSIS — M069 Rheumatoid arthritis, unspecified: Secondary | ICD-10-CM

## 2014-11-30 DIAGNOSIS — R892 Abnormal level of other drugs, medicaments and biological substances in specimens from other organs, systems and tissues: Secondary | ICD-10-CM

## 2014-11-30 MED ORDER — METHOCARBAMOL 500 MG PO TABS: 500.0000 mg | ORAL_TABLET | Freq: Three times a day (TID) | ORAL | Status: DC | PRN

## 2014-11-30 MED ORDER — OXYCODONE-ACETAMINOPHEN 7.5-325 MG PO TABS: ORAL_TABLET | ORAL | Status: DC

## 2014-11-30 NOTE — Patient Instructions (Signed)
Hamstring strain/tear. Ice to hamstring on right Stop zanaflex and flexeril, take robaxin muscle relaxant. Oxycodone refilled today given acute flare of pain after hamstring strain. Pass up front and schedule appointment with Dr Patsy Lager.

## 2014-11-30 NOTE — Progress Notes (Signed)
Pre visit review using our clinic review tool, if applicable. No additional management support is needed unless otherwise documented below in the visit note. 

## 2014-11-30 NOTE — Progress Notes (Signed)
BP 132/70 mmHg  Pulse 65  Temp(Src) 98.2 F (36.8 C) (Oral)  Wt 187 lb 8 oz (85.049 kg)  SpO2 98%   CC: posterior R thigh pain after stepping into hole at home  Subjective:    Patient ID: Kaylee [name], female    DOB: 05-12-1961, 54 y.o.   MRN: [id number]  HPI: Kaylee [name] is a 53 y.o. female presenting on 11/30/2014 for Joint Swelling   5d ago injured R leg while pulling AC unit out of trailer window by herself. Stepped backwards with R leg into hole, felt sharp pulling pain from posterior right buttock to knee. Since then pain worsening - more pain with laying on right side, or with transitions from sitting to standing.  She has increased oxycodone 7.5/325mg  (doubled use) for last 5 days 2/2 acute pain. Has only a few pills left (last prescribed #90 11/02/2014). She has also been taking zanaflex 4mg  a few times a day but doesn't feel this has been helpful. She also restarted some prednisone she had.   Increased stress recently -  Grandson just born - with genetic troubles and recently had shunt placement, states has 9yr life expectancy per neonatologist.  Husband (separated) committed suicide on Mother's day - he was serving time in 39yr. Alcohol and herione use involved.   Currently denies UTI sxs. On ppx nitrofurantoin.  Relevant past medical, surgical, family and social history reviewed and updated as indicated. Interim medical history since our last visit reviewed. Allergies and medications reviewed and updated. Current Outpatient Prescriptions on File Prior to Visit  Medication Sig  . albuterol (PROVENTIL HFA;VENTOLIN HFA) 108 (90 BASE) MCG/ACT inhaler Inhale 2 puffs into the lungs every 6 (six) hours as needed.  . ALPRAZolam (XANAX) 1 MG tablet TAKE ONE TABLET TWICE DAILY  . aspirin 81 MG tablet Take 81 mg by mouth daily.  [place] b complex vitamins tablet Take 1 tablet by mouth daily.  [name] BIOTIN PO Take by mouth daily.  . calcium-vitamin D (OSCAL) 250-125 MG-UNIT  per tablet Take 2 tablets by mouth daily.  . Cyanocobalamin (B-12) 1000 MCG CAPS Take 1 capsule by mouth daily.  . diazepam (VALIUM) 10 MG tablet TAKE ONE TABLET TWICE DAILY  . dicyclomine (BENTYL) 10 MG capsule Take 10 mg by mouth every 6 (six) hours.  [name] EPINEPHrine 0.3 mg/0.3 mL IJ SOAJ injection Inject 0.3 mg into the muscle once. For quinolone reaction  . etodolac (LODINE) 400 MG tablet TAKE ONE TABLET TWICE DAILY  . furosemide (LASIX) 20 MG tablet Take 1 tablet (20 mg total) by mouth daily as needed.  . hydroxychloroquine (PLAQUENIL) 200 MG tablet Take 200 mg by mouth daily.  . Multiple Vitamin (MULTIVITAMIN) tablet Take 1 tablet by mouth daily.  . Multiple Vitamins-Minerals (PRESERVISION AREDS PO) Take by mouth 2 (two) times daily.  [name] neomycin-polymyxin b-dexamethasone (MAXITROL) 3.5-10000-0.1 SUSP PUT ONE DROP INTO THE LEFT EYE AS NEEDED  . neomycin-polymyxin-hydrocortisone (CORTISPORIN) otic solution Place 3 drops into both ears 4 (four) times daily.  . nitrofurantoin (MACRODANTIN) 100 MG capsule TAKE ONE (1) CAPSULE EACH DAY FOR UTI  . nystatin (MYCOSTATIN) 100000 UNIT/ML suspension Take 5 mLs (500,000 Units total) by mouth 4 (four) times daily. Swish and swallow  . omega-3 acid ethyl esters (LOVAZA) 1 G capsule Take 2 capsules (2 g total) by mouth daily.  . pantoprazole (PROTONIX) 40 MG tablet Take 1 tablet (40 mg total) by mouth daily.  . polyethylene glycol powder (GLYCOLAX/MIRALAX) powder MIX 17GM (AS  MARKED IN BOTTLE TOP) IN 8 OUNCES OF WATER, MIX AND DRINK TWICE A DAY AS NEEDED FOR MODERATE CONSTIPATION.  [name] potassium chloride (K-DUR) 10 MEQ tablet Take 1 tablet (10 mEq total) by mouth daily as needed.  . predniSONE (DELTASONE) 5 MG tablet Take 1 tablet (5 mg total) by mouth as needed.  [name] tiZANidine (ZANAFLEX) 4 MG tablet TAKE ONE TABLET AT BEDTIME   No current facility-administered medications on file prior to visit.    Review of Systems Per HPI unless specifically indicated  above     Objective:    BP 132/70 mmHg  Pulse 65  Temp(Src) 98.2 F (36.8 C) (Oral)  Wt 187 lb 8 oz (85.049 kg)  SpO2 98%  Wt Readings from Last 3 Encounters:  11/30/14 187 lb 8 oz (85.049 kg)  10/26/14 188 lb (85.276 kg)  08/30/14 191 lb 8 oz (86.864 kg)    Physical Exam  Constitutional: She is oriented to person, place, and time. She appears well-developed and well-nourished. She appears distressed.  Musculoskeletal: She exhibits no edema.  Marked tenderness to palpation posterior R thigh predominantly at inferior buttock but no defect appreciated. Main pain with testing contraction of hamstrings and pulling pain with full extension of leg. No bruising noted.   Neurological: She is alert and oriented to person, place, and time.  Skin: Skin is warm and dry. No rash noted.  Nursing note and vitals reviewed.      Assessment & Plan:   Problem List Items Addressed This Visit    Abnormal drug screen    Discussed 2nd abnormal UDS today (from 11/05/2014) - inappropriately negative for oxycodone and xanax (although xanax was present but not at cutoff limit). Pt may have had UTI when UDS was obtained. Pt endorses regular use of medications.  Discussed alcohol presence in UDS - pt states she drinks 1 beer a day, I recommended she not mix alcohol with her medications - as she's on narcotic and benzo x2 (for chronic severe anxiety d/o).  Advised if oxycodone or xanax return negative again, will stop prescribing these medications - will recheck next visit.      Chronic pain syndrome   GAD (generalized anxiety disorder)    Will need to readdress trial of daily mood medication to help anxiety in hopes of deccreasing benzo use.      Rheumatoid arthritis    Vs osteoarthritis. Has established with Dr Gavin Potters. Intermittently on prednisone. Currently on daily plaquenil, as well as etodolac and oxycodone prn breakthrough pain. Will need hep C testing next labwork.      Relevant Medications    oxyCODONE-acetaminophen (PERCOCET) 7.5-325 MG per tablet   methocarbamol (ROBAXIN) 500 MG tablet   Right hamstring muscle strain - Primary    Significant hamstring strain vs tendon tear although I don't see evidence of this today. Treat with robaxin muscle relaxant (fexeril and zanaflex has been overly sedating), continue lodine, continue percocets 7.5/325mg  (refilled #90 today). See below. rec continued ice to posterior thigh. rec f/u with Dr Patsy Lager SM next week for further eval, consider [place] to r/o tear.          Follow up plan: Return if symptoms worsen or fail to improve.

## 2014-12-01 ENCOUNTER — Encounter: Payer: Self-pay | Admitting: Family Medicine

## 2014-12-01 DIAGNOSIS — S76311A Strain of muscle, fascia and tendon of the posterior muscle group at thigh level, right thigh, initial encounter: Secondary | ICD-10-CM | POA: Insufficient documentation

## 2014-12-01 NOTE — Assessment & Plan Note (Addendum)
Significant hamstring strain vs tendon tear although I don't see evidence of this today. Treat with robaxin muscle relaxant (fexeril and zanaflex has been overly sedating), continue lodine, continue percocets 7.5/325mg  (refilled #90 today). See below. rec continued ice to posterior thigh. rec f/u with Dr Patsy Lager SM next week for further eval, consider [place] to r/o tear.

## 2014-12-01 NOTE — Assessment & Plan Note (Signed)
Discussed 2nd abnormal UDS today (from 11/05/2014) - inappropriately negative for oxycodone and xanax (although xanax was present but not at cutoff limit). Pt may have had UTI when UDS was obtained. Pt endorses regular use of medications.  Discussed alcohol presence in UDS - pt states she drinks 1 beer a day, I recommended she not mix alcohol with her medications - as she's on narcotic and benzo x2 (for chronic severe anxiety d/o).  Advised if oxycodone or xanax return negative again, will stop prescribing these medications - will recheck next visit.

## 2014-12-01 NOTE — Assessment & Plan Note (Signed)
Will need to readdress trial of daily mood medication to help anxiety in hopes of deccreasing benzo use.

## 2014-12-01 NOTE — Assessment & Plan Note (Addendum)
Vs osteoarthritis. Has established with Dr Gavin Potters. Intermittently on prednisone. Currently on daily plaquenil, as well as etodolac and oxycodone prn breakthrough pain. Will need hep C testing next labwork.

## 2014-12-03 ENCOUNTER — Other Ambulatory Visit: Payer: Self-pay | Admitting: Family Medicine

## 2014-12-04 NOTE — Telephone Encounter (Signed)
plz phone in. 

## 2014-12-04 NOTE — Telephone Encounter (Signed)
Rx called to pharmacy

## 2014-12-06 ENCOUNTER — Ambulatory Visit: Payer: Medicare Other | Admitting: Family Medicine

## 2014-12-09 ENCOUNTER — Emergency Department
Admission: EM | Admit: 2014-12-09 | Discharge: 2014-12-09 | Disposition: A | Discharge status: Home / Self Care | Payer: Medicare Other | Attending: Emergency Medicine | Admitting: Emergency Medicine

## 2014-12-09 ENCOUNTER — Encounter: Payer: Self-pay | Admitting: Emergency Medicine

## 2014-12-09 ENCOUNTER — Emergency Department: Payer: Medicare Other

## 2014-12-09 DIAGNOSIS — Z7982 Long term (current) use of aspirin: Secondary | ICD-10-CM | POA: Diagnosis not present

## 2014-12-09 DIAGNOSIS — Y9389 Activity, other specified: Secondary | ICD-10-CM | POA: Insufficient documentation

## 2014-12-09 DIAGNOSIS — S0993XA Unspecified injury of face, initial encounter: Secondary | ICD-10-CM | POA: Insufficient documentation

## 2014-12-09 DIAGNOSIS — S300XXA Contusion of lower back and pelvis, initial encounter: Secondary | ICD-10-CM | POA: Diagnosis not present

## 2014-12-09 DIAGNOSIS — S299XXA Unspecified injury of thorax, initial encounter: Secondary | ICD-10-CM | POA: Diagnosis not present

## 2014-12-09 DIAGNOSIS — S93401A Sprain of unspecified ligament of right ankle, initial encounter: Secondary | ICD-10-CM | POA: Diagnosis not present

## 2014-12-09 DIAGNOSIS — Y92009 Unspecified place in unspecified non-institutional (private) residence as the place of occurrence of the external cause: Secondary | ICD-10-CM | POA: Insufficient documentation

## 2014-12-09 DIAGNOSIS — S99912A Unspecified injury of left ankle, initial encounter: Secondary | ICD-10-CM | POA: Insufficient documentation

## 2014-12-09 DIAGNOSIS — Y998 Other external cause status: Secondary | ICD-10-CM | POA: Diagnosis not present

## 2014-12-09 DIAGNOSIS — S79921A Unspecified injury of right thigh, initial encounter: Secondary | ICD-10-CM | POA: Diagnosis not present

## 2014-12-09 DIAGNOSIS — M545 Low back pain: Secondary | ICD-10-CM | POA: Diagnosis not present

## 2014-12-09 DIAGNOSIS — Z79899 Other long term (current) drug therapy: Secondary | ICD-10-CM | POA: Diagnosis not present

## 2014-12-09 DIAGNOSIS — M25571 Pain in right ankle and joints of right foot: Secondary | ICD-10-CM | POA: Diagnosis not present

## 2014-12-09 DIAGNOSIS — S20229A Contusion of unspecified back wall of thorax, initial encounter: Secondary | ICD-10-CM | POA: Diagnosis not present

## 2014-12-09 DIAGNOSIS — Z87891 Personal history of nicotine dependence: Secondary | ICD-10-CM | POA: Insufficient documentation

## 2014-12-09 DIAGNOSIS — E119 Type 2 diabetes mellitus without complications: Secondary | ICD-10-CM | POA: Diagnosis not present

## 2014-12-09 DIAGNOSIS — S3992XA Unspecified injury of lower back, initial encounter: Secondary | ICD-10-CM | POA: Diagnosis not present

## 2014-12-09 DIAGNOSIS — M7989 Other specified soft tissue disorders: Secondary | ICD-10-CM | POA: Diagnosis not present

## 2014-12-09 NOTE — Discharge Instructions (Signed)
Ankle Sprain An ankle sprain is an injury to the strong, fibrous tissues (ligaments) that hold your ankle bones together.  HOME CARE   Put ice on your ankle for 1-2 days or as told by your doctor.  Put ice in a plastic bag.  Place a towel between your skin and the bag.  Leave the ice on for 15-20 minutes at a time, every 2 hours while you are awake.  Only take medicine as told by your doctor.  Raise (elevate) your injured ankle above the level of your heart as much as possible for 2-3 days.  Use crutches if your doctor tells you to. Slowly put your own weight on the affected ankle. Use the crutches until you can walk without pain.  If you have a plaster splint:  Do not rest it on anything harder than a pillow for 24 hours.  Do not put weight on it.  Do not get it wet.  Take it off to shower or bathe.  If given, use an elastic wrap or support stocking for support. Take the wrap off if your toes lose feeling (numb), tingle, or turn cold or blue.  If you have an air splint:  Add or let out air to make it comfortable.  Take it off at night and to shower and bathe.  Wiggle your toes and move your ankle up and down often while you are wearing it. GET HELP IF:  You have rapidly increasing bruising or puffiness (swelling).  Your toes feel very cold.  You lose feeling in your foot.  Your medicine does not help your pain. GET HELP RIGHT AWAY IF:   Your toes lose feeling (numb) or turn blue.  You have severe pain that is increasing. MAKE SURE YOU:   Understand these instructions.  Will watch your condition.  Will get help right away if you are not doing well or get worse. Document Released: 12/16/2007 Document Revised: 11/13/2013 Document Reviewed: 01/11/2012 [facility] Patient Information 2015 [place], [place]. This information is not intended to replace advice given to you by your health care provider. Make sure you discuss any questions you have with your health care  provider.  Assault, General Assault includes any behavior, whether intentional or reckless, which results in bodily injury to another person and/or damage to property. Included in this would be any behavior, intentional or reckless, that by its nature would be understood (interpreted) by a reasonable person as intent to harm another person or to damage his/her property. Threats may be oral or written. They may be communicated through regular mail, computer, fax, or phone. These threats may be direct or implied. FORMS OF ASSAULT INCLUDE:  Physically assaulting a person. This includes physical threats to inflict physical harm as well as:  Slapping.  Hitting.  Poking.  Kicking.  Punching.  Pushing.  Arson.  Sabotage.  Equipment vandalism.  Damaging or destroying property.  Throwing or hitting objects.  Displaying a weapon or an object that appears to be a weapon in a threatening manner.  Carrying a firearm of any kind.  Using a weapon to harm someone.  Using greater physical size/strength to intimidate another.  Making intimidating or threatening gestures.  Bullying.  Hazing.  Intimidating, threatening, hostile, or abusive language directed toward another person.  It communicates the intention to engage in violence against that person. And it leads a reasonable person to expect that violent behavior may occur.  Stalking another person. IF IT HAPPENS AGAIN:  Immediately call for emergency  help (911 in [place]  If someone poses clear and immediate danger to you, seek legal authorities to have a protective or restraining order put in place.  Less threatening assaults can at least be reported to authorities. STEPS TO TAKE IF A SEXUAL ASSAULT HAS HAPPENED  Go to an area of safety. This may include a shelter or staying with a friend. Stay away from the area where you have been attacked. A large percentage of sexual assaults are caused by a friend, relative or  associate.  If medications were given by your caregiver, take them as directed for the full length of time prescribed.  Only take over-the-counter or prescription medicines for pain, discomfort, or fever as directed by your caregiver.  If you have come in contact with a sexual disease, find out if you are to be tested again. If your caregiver is concerned about the HIV/AIDS virus, he/she may require you to have continued testing for several months.  For the protection of your privacy, test results can not be given over the phone. Make sure you receive the results of your test. If your test results are not back during your visit, make an appointment with your caregiver to find out the results. Do not assume everything is normal if you have not heard from your caregiver or the medical facility. It is important for you to follow up on all of your test results.  File appropriate papers with authorities. This is important in all assaults, even if it has occurred in a family or by a friend. SEEK MEDICAL CARE IF:  You have new problems because of your injuries.  You have problems that may be because of the medicine you are taking, such as:  Rash.  Itching.  Swelling.  Trouble breathing.  You develop belly (abdominal) pain, feel sick to your stomach (nausea) or are vomiting.  You begin to run a temperature.  You need supportive care or referral to a rape crisis center. These are centers with trained personnel who can help you get through this ordeal. SEEK IMMEDIATE MEDICAL CARE IF:  You are afraid of being threatened, beaten, or abused. In [place] call 911.  You receive new injuries related to abuse.  You develop severe pain in any area injured in the assault or have any change in your condition that concerns you.  You faint or lose consciousness.  You develop chest pain or shortness of breath. Document Released: 06/29/2005 Document Revised: 09/21/2011 Document Reviewed:  02/15/2008 [facility] Patient Information 2015 [place], [place]. This information is not intended to replace advice given to you by your health care provider. Make sure you discuss any questions you have with your health care provider.  Contusion A contusion is a deep bruise. Contusions happen when an injury causes bleeding under the skin. Signs of bruising include pain, puffiness (swelling), and discolored skin. The contusion may turn blue, purple, or yellow. HOME CARE   Put ice on the injured area.  Put ice in a plastic bag.  Place a towel between your skin and the bag.  Leave the ice on for 15-20 minutes, 03-04 times a day.  Only take medicine as told by your doctor.  Rest the injured area.  If possible, raise (elevate) the injured area to lessen puffiness. GET HELP RIGHT AWAY IF:   You have more bruising or puffiness.  You have pain that is getting worse.  Your puffiness or pain is not helped by medicine. MAKE SURE YOU:   Understand these  instructions.  Will watch your condition.  Will get help right away if you are not doing well or get worse. Document Released: 12/16/2007 Document Revised: 09/21/2011 Document Reviewed: 05/04/2011 [facility] Patient Information 2015 [place], [place]. This information is not intended to replace advice given to you by your health care provider. Make sure you discuss any questions you have with your health care provider.  Your x-rays are negative for fracture. Take your home meds. Rest with the legs elevated to reduce swelling.  Follow-up with your provider as scheduled.
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
## 2014-12-10 ENCOUNTER — Encounter: Payer: Self-pay | Admitting: Family Medicine

## 2014-12-12 ENCOUNTER — Encounter: Payer: Self-pay | Admitting: Family Medicine

## 2014-12-12 ENCOUNTER — Ambulatory Visit (INDEPENDENT_AMBULATORY_CARE_PROVIDER_SITE_OTHER): Payer: Medicare Other | Admitting: Family Medicine

## 2014-12-12 VITALS — BP 140/70 | HR 80 | Temp 97.6°F | Ht 66.0 in | Wt 190.0 lb

## 2014-12-12 DIAGNOSIS — S76311A Strain of muscle, fascia and tendon of the posterior muscle group at thigh level, right thigh, initial encounter: Secondary | ICD-10-CM

## 2014-12-12 DIAGNOSIS — F4321 Adjustment disorder with depressed mood: Secondary | ICD-10-CM
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
## 2014-12-12 NOTE — Progress Notes (Signed)
Pre visit review using our clinic review tool, if applicable. No additional management support is needed unless otherwise documented below in the visit note. 

## 2014-12-12 NOTE — Patient Instructions (Signed)
Hamstring Rehab 1)  HS curls - start with 3 sets of 15 and progress to 3 sets of 30 every 3 days 2)  HS curls with weight - begin with 2 lb ankle weight when above is easy and start with 3 sets of 10; increasing every 5 days by 5 reps - eg to 3 sets of 15 reps 3)  HS swings - swing leg backwards and curl at the end of the swing; follow same schedule as above. 4)  HS running lunges - running lunge position means no more than 45 degrees of knee flexion and running motion.  follow same schedule as above.    Hamstring walking program: comfortable pace 2.8-4.0 mph, start 0, increase every 2 mins by 3 degrees, 0-3-12-20-10-15-12-9-6-3-0, repeat until 30 mins complete. It is OK to do bicycle riding, elliptical, etc. Hold off on running until OK'd by MD.  Try to do this workout 3 times a week or more in addition to above.

## 2014-12-17 DIAGNOSIS — M150 Primary generalized (osteo)arthritis: Secondary | ICD-10-CM | POA: Diagnosis not present

## 2014-12-17 DIAGNOSIS — M1990 Unspecified osteoarthritis, unspecified site: Secondary | ICD-10-CM | POA: Diagnosis not present

## 2014-12-21 ENCOUNTER — Other Ambulatory Visit: Payer: Self-pay | Admitting: Family Medicine

## 2014-12-24 ENCOUNTER — Other Ambulatory Visit: Payer: Self-pay | Admitting: Family Medicine

## 2014-12-24 ENCOUNTER — Telehealth: Payer: Self-pay | Admitting: Family Medicine

## 2014-12-24 DIAGNOSIS — M150 Primary generalized (osteo)arthritis: Secondary | ICD-10-CM | POA: Diagnosis not present

## 2014-12-24 DIAGNOSIS — M1990 Unspecified osteoarthritis, unspecified site: Secondary | ICD-10-CM | POA: Diagnosis not present

## 2014-12-24 NOTE — Telephone Encounter (Signed)
To Donna

## 2014-12-24 NOTE — Telephone Encounter (Signed)
Ok to refill 

## 2014-12-24 NOTE — Telephone Encounter (Signed)
Received faxed refill request from pharmacy for Oxycodone. Last refill 11/30/14 #90, last office visit 12/12/14/acute. Call when ready for pickup.

## 2014-12-24 NOTE — Telephone Encounter (Signed)
Kaylee [name] notified as instructed by telephone.  She states she was not given an AVS with her rehab instructions and was hoping to get something in writing to make sure she is doing everything she needs to be doing as well as having something in writing to take to court with her.  Request Rehab instructions be mailed to her.

## 2014-12-24 NOTE — Telephone Encounter (Signed)
Pt called wanting to get some information on how to care for her torn tendion She did get the brace that dr copland wanted her to get   [address] [place] [address]

## 2014-12-24 NOTE — Telephone Encounter (Signed)
AVS with Hamstring Rehab instructions mailed to patient as requested.

## 2014-12-24 NOTE — Telephone Encounter (Signed)
We talked for 30 minutes about this. I gave her rehab to work on.   Find out if she has any specific questions. Honestly, with this level of hamstring injury, she probably could do nothing at all and it will be fine with time.

## 2014-12-24 NOTE — Telephone Encounter (Signed)
Chart audited, AVS printed at day and time of encounter and I remember reviewing her hamstring rehab with her.   We will send to her again.

## 2014-12-25 ENCOUNTER — Other Ambulatory Visit: Payer: Self-pay | Admitting: Cardiovascular Disease

## 2014-12-25 ENCOUNTER — Encounter: Payer: Self-pay | Admitting: Family Medicine

## 2014-12-25 ENCOUNTER — Other Ambulatory Visit: Payer: Self-pay

## 2014-12-25 MED ORDER — OXYCODONE-ACETAMINOPHEN 7.5-325 MG PO TABS: ORAL_TABLET | ORAL | Status: DC

## 2014-12-25 NOTE — Telephone Encounter (Signed)
Pt request refill methocarbamol to [facility]; pt said muscle relaxant working well.Please advise. Refill has already been done. Pt notified done.

## 2014-12-25 NOTE — Telephone Encounter (Signed)
Printed and in Kim's box 

## 2014-12-25 NOTE — Telephone Encounter (Signed)
plz phone in. 

## 2014-12-25 NOTE — Telephone Encounter (Signed)
Rx called in as directed.   

## 2014-12-25 NOTE — Telephone Encounter (Signed)
Message left notifying patient and Rx placed up front for pick up. 

## 2015-01-17 ENCOUNTER — Emergency Department
Admission: EM | Admit: 2015-01-17 | Discharge: 2015-01-17 | Discharge status: Against Medical Advice | Payer: Medicare Other | Attending: Emergency Medicine | Admitting: Emergency Medicine

## 2015-01-17 ENCOUNTER — Encounter: Payer: Self-pay | Admitting: Urgent Care

## 2015-01-17 DIAGNOSIS — Z720 Tobacco use: Secondary | ICD-10-CM | POA: Diagnosis not present

## 2015-01-17 DIAGNOSIS — Z87828 Personal history of other (healed) physical injury and trauma: Secondary | ICD-10-CM | POA: Diagnosis not present

## 2015-01-17 DIAGNOSIS — R400 Somnolence: Secondary | ICD-10-CM | POA: Diagnosis not present

## 2015-01-17 DIAGNOSIS — M549 Dorsalgia, unspecified: Secondary | ICD-10-CM | POA: Diagnosis not present
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
## 2015-01-23 ENCOUNTER — Other Ambulatory Visit: Payer: Self-pay | Admitting: Family Medicine

## 2015-01-23 NOTE — Telephone Encounter (Signed)
plz phone in. 

## 2015-01-23 NOTE — Telephone Encounter (Signed)
Ok to refill 

## 2015-01-24 ENCOUNTER — Other Ambulatory Visit: Payer: Self-pay | Admitting: *Deleted

## 2015-01-24 NOTE — Telephone Encounter (Signed)
Rx called in as directed.   

## 2015-01-24 NOTE — Telephone Encounter (Signed)
Ok to refill 

## 2015-01-25 MED ORDER — OXYCODONE-ACETAMINOPHEN 7.5-325 MG PO TABS: ORAL_TABLET | ORAL | Status: DC

## 2015-01-25 MED ORDER — ETODOLAC 400 MG PO TABS: 400.0000 mg | ORAL_TABLET | Freq: Two times a day (BID) | ORAL | Status: DC

## 2015-01-25 NOTE — Telephone Encounter (Signed)
Patient notified and Rx's placed up front for pick up. Reprinted results and in your IN box for review.

## 2015-01-25 NOTE — Telephone Encounter (Signed)
Printed and in Kim's box. Will need UDS. plz find UDS from 11/05/2014 and re scan into chart. I don't see it in lab results section.

## 2015-01-29 ENCOUNTER — Encounter: Payer: Self-pay | Admitting: Family Medicine

## 2015-01-29 DIAGNOSIS — Z79899 Other long term (current) drug therapy: Secondary | ICD-10-CM | POA: Diagnosis not present

## 2015-01-29 DIAGNOSIS — Z79891 Long term (current) use of opiate analgesic: Secondary | ICD-10-CM | POA: Diagnosis not present

## 2015-02-06 ENCOUNTER — Ambulatory Visit: Payer: Medicare Other | Admitting: Primary Care

## 2015-02-12 ENCOUNTER — Ambulatory Visit: Payer: Medicare Other | Admitting: Primary Care

## 2015-02-16 ENCOUNTER — Encounter: Payer: Self-pay | Admitting: Family Medicine

## 2015-02-19 ENCOUNTER — Other Ambulatory Visit: Payer: Self-pay | Admitting: Family Medicine

## 2015-02-19 DIAGNOSIS — E538 Deficiency of other specified B group vitamins: Secondary | ICD-10-CM

## 2015-02-19 DIAGNOSIS — E785 Hyperlipidemia, unspecified: Secondary | ICD-10-CM

## 2015-02-19 DIAGNOSIS — M069 Rheumatoid arthritis, unspecified: Secondary | ICD-10-CM

## 2015-02-20 ENCOUNTER — Other Ambulatory Visit: Payer: Self-pay | Admitting: Family Medicine

## 2015-02-20 ENCOUNTER — Other Ambulatory Visit (INDEPENDENT_AMBULATORY_CARE_PROVIDER_SITE_OTHER): Payer: Medicare Other

## 2015-02-20 DIAGNOSIS — M069 Rheumatoid arthritis, unspecified: Secondary | ICD-10-CM

## 2015-02-20 DIAGNOSIS — Z209 Contact with and (suspected) exposure to unspecified communicable disease: Secondary | ICD-10-CM | POA: Diagnosis not present

## 2015-02-20 DIAGNOSIS — E538 Deficiency of other specified B group vitamins: Secondary | ICD-10-CM

## 2015-02-20 DIAGNOSIS — E785 Hyperlipidemia, unspecified: Secondary | ICD-10-CM | POA: Diagnosis not present

## 2015-02-20 LAB — CBC WITH DIFFERENTIAL/PLATELET
Basophils Absolute: 0 10*3/uL (ref 0.0–0.1)
Basophils Relative: 0.7 % (ref 0.0–3.0)
EOS ABS: 0.3 10*3/uL (ref 0.0–0.7)
Eosinophils Relative: 5.2 % — ABNORMAL HIGH (ref 0.0–5.0)
HCT: 39.1 % (ref 36.0–46.0)
Hemoglobin: 13.3 g/dL (ref 12.0–15.0)
Lymphocytes Relative: 52.4 % — ABNORMAL HIGH (ref 12.0–46.0)
Lymphs Abs: 3.5 10*3/uL (ref 0.7–4.0)
MCHC: 34.1 g/dL (ref 30.0–36.0)
MCV: 92.8 fl (ref 78.0–100.0)
MONO ABS: 0.5 10*3/uL (ref 0.1–1.0)
Monocytes Relative: 7.9 % (ref 3.0–12.0)
NEUTROS ABS: 2.3 10*3/uL (ref 1.4–7.7)
Neutrophils Relative %: 33.8 % — ABNORMAL LOW (ref 43.0–77.0)
Platelets: 220 10*3/uL (ref 150.0–400.0)
RBC: 4.22 Mil/uL (ref 3.87–5.11)
RDW: 12.5 % (ref 11.5–15.5)
WBC: 6.7 10*3/uL (ref 4.0–10.5)

## 2015-02-20 LAB — LDL CHOLESTEROL, DIRECT: Direct LDL: 124 mg/dL

## 2015-02-20 LAB — COMPREHENSIVE METABOLIC PANEL
ALT: 19 U/L (ref 0–35)
AST: 23 U/L (ref 0–37)
Albumin: 4.1 g/dL (ref 3.5–5.2)
Alkaline Phosphatase: 53 U/L (ref 39–117)
BUN: 12 mg/dL (ref 6–23)
CALCIUM: 9.7 mg/dL (ref 8.4–10.5)
CHLORIDE: 103 meq/L (ref 96–112)
CO2: 28 mEq/L (ref 19–32)
Creatinine, Ser: 0.87 mg/dL (ref 0.40–1.20)
GFR: 71.77 mL/min (ref >60.00)
GLUCOSE: 120 mg/dL — AB (ref 70–99)
Potassium: 3.8 mEq/L (ref 3.5–5.1)
Sodium: 140 mEq/L (ref 135–145)
TOTAL PROTEIN: 6.7 g/dL (ref 6.0–8.3)
Total Bilirubin: 0.6 mg/dL (ref 0.2–1.2)

## 2015-02-20 LAB — LIPID PANEL
Cholesterol: 220 mg/dL — ABNORMAL HIGH (ref 0–200)
HDL: 49.8 mg/dL (ref >39.00)
NONHDL: 170.22
Total CHOL/HDL Ratio: 4
Triglycerides: 256 mg/dL — ABNORMAL HIGH (ref 0.0–149.0)
VLDL: 51.2 mg/dL — ABNORMAL HIGH (ref 0.0–40.0)

## 2015-02-20 LAB — TSH: TSH: 1.47 u[IU]/mL (ref 0.35–4.50)

## 2015-02-20 LAB — VITAMIN B12: Vitamin B-12: 975 pg/mL — ABNORMAL HIGH (ref 211–911)

## 2015-02-20 NOTE — Addendum Note (Signed)
Addended by: Baldomero Lamy on: 02/20/2015 10:46 AM   Modules accepted: Orders

## 2015-02-20 NOTE — Addendum Note (Signed)
Addended by: Baldomero Lamy on: 02/20/2015 10:33 AM   Modules accepted: Orders

## 2015-02-21 LAB — HEPATITIS C ANTIBODY: HCV Ab: NEGATIVE

## 2015-02-21 LAB — HIV ANTIBODY (ROUTINE TESTING W REFLEX): HIV 1&2 Ab, 4th Generation: NONREACTIVE

## 2015-02-21 LAB — HEPATITIS B SURFACE ANTIBODY,QUALITATIVE: Hep B S Ab: NEGATIVE

## 2015-02-21 LAB — HEPATITIS B SURFACE ANTIGEN: HEP B S AG: NEGATIVE

## 2015-02-21 LAB — RPR

## 2015-02-22 ENCOUNTER — Encounter: Payer: Self-pay | Admitting: Family Medicine

## 2015-02-25 ENCOUNTER — Other Ambulatory Visit: Payer: Self-pay

## 2015-02-25 NOTE — Telephone Encounter (Signed)
Pt left v/m; pt was to see Dr Lavenia Atlas on 02/21/15 and could not be seen due to controversy over a bill [facility] said pt owed; pt said she does not owe the bill but request Dr Reece Agar to refill prednisone 5 mg until pt sees Dr Reece Agar on 02/27/15 for CPX. Pt has been on her back on the floor for 5 days. When pt comes in for CPX will request new rheumatologist. Please advise.

## 2015-02-26 MED ORDER — PREDNISONE 5 MG PO TABS: 5.0000 mg | ORAL_TABLET | Freq: Every day | ORAL | Status: DC

## 2015-02-26 NOTE — Telephone Encounter (Signed)
plz phone in - won't let me send electronically because pt demographic info is too long?

## 2015-02-26 NOTE — Telephone Encounter (Signed)
Refilled x 1 month 

## 2015-02-26 NOTE — Progress Notes (Signed)
I have not been able to reach the patient on numerous occasions.  Patient has appt for CPE on 02/27/15,  Please advise at that time.

## 2015-02-26 NOTE — Progress Notes (Signed)
Have not been able to reach patient on numerous occasions to notify of lab results.  Pt has appt for CPE on 02/27/15.  Please advise at that time.

## 2015-02-26 NOTE — Telephone Encounter (Signed)
Rx called to pharmacy as instructed. 

## 2015-02-27 ENCOUNTER — Encounter: Payer: Self-pay | Admitting: Family Medicine

## 2015-02-27 ENCOUNTER — Other Ambulatory Visit: Payer: Self-pay | Admitting: *Deleted

## 2015-02-27 ENCOUNTER — Other Ambulatory Visit: Payer: Self-pay | Admitting: Family Medicine

## 2015-02-27 ENCOUNTER — Ambulatory Visit (INDEPENDENT_AMBULATORY_CARE_PROVIDER_SITE_OTHER): Payer: Medicare Other | Admitting: Family Medicine

## 2015-02-27 VITALS — BP 128/80 | HR 80 | Temp 98.3°F | Ht 66.0 in | Wt 189.2 lb

## 2015-02-27 DIAGNOSIS — R892 Abnormal level of other drugs, medicaments and biological substances in specimens from other organs, systems and tissues: Secondary | ICD-10-CM

## 2015-02-27 DIAGNOSIS — Z23 Encounter for immunization: Secondary | ICD-10-CM

## 2015-02-27 DIAGNOSIS — Z283 Underimmunization status: Secondary | ICD-10-CM

## 2015-02-27 DIAGNOSIS — Z0000 Encounter for general adult medical examination without abnormal findings: Secondary | ICD-10-CM | POA: Diagnosis not present

## 2015-02-27 DIAGNOSIS — Z2839 Other underimmunization status: Secondary | ICD-10-CM

## 2015-02-27 DIAGNOSIS — E785 Hyperlipidemia, unspecified: Secondary | ICD-10-CM

## 2015-02-27 DIAGNOSIS — Z7189 Other specified counseling: Secondary | ICD-10-CM

## 2015-02-27 DIAGNOSIS — M069 Rheumatoid arthritis, unspecified: Secondary | ICD-10-CM

## 2015-02-27 DIAGNOSIS — F411 Generalized anxiety disorder: Secondary | ICD-10-CM

## 2015-02-27 DIAGNOSIS — G894 Chronic pain syndrome: Secondary | ICD-10-CM

## 2015-02-27 MED ORDER — ALPRAZOLAM 1 MG PO TABS: 1.0000 mg | ORAL_TABLET | Freq: Two times a day (BID) | ORAL | Status: DC

## 2015-02-27 MED ORDER — METHOCARBAMOL 500 MG PO TABS: ORAL_TABLET | ORAL | Status: DC

## 2015-02-27 MED ORDER — OXYCODONE-ACETAMINOPHEN 7.5-325 MG PO TABS: ORAL_TABLET | ORAL | Status: DC

## 2015-02-27 MED ORDER — OMEGA-3-ACID ETHYL ESTERS 1 G PO CAPS: ORAL_CAPSULE | ORAL | Status: DC

## 2015-02-27 MED ORDER — DIAZEPAM 10 MG PO TABS: 10.0000 mg | ORAL_TABLET | Freq: Two times a day (BID) | ORAL | Status: DC

## 2015-02-27 NOTE — Assessment & Plan Note (Addendum)
If persistent abnormal UDS will need to change anxiety regimen (likely to one long acting benzo). Discussed with patient. Current regimen started at cardiology recommendation.

## 2015-02-27 NOTE — Assessment & Plan Note (Signed)
Preventative protocols reviewed and updated unless pt declined. Discussed healthy diet and lifestyle.  

## 2015-02-27 NOTE — Patient Instructions (Addendum)
Call the breast center to schedule mammogram at [phone] Advanced directive packet provided today. meds refilled today. labwork ok, but triglycerides high. Decrease added sugars, eliminate trans fats, increase fiber and limit alcohol.  All these changes together can drop triglycerides by almost 50%.  Return as needed or in 3 months for follow up. Hep B shot today (as not immune)

## 2015-02-27 NOTE — Progress Notes (Signed)
Pre visit review using our clinic review tool, if applicable. No additional management support is needed unless otherwise documented below in the visit note. 

## 2015-02-27 NOTE — Addendum Note (Signed)
Addended by: Josph Macho A on: 02/27/2015 12:37 PM   Modules accepted: Orders

## 2015-02-27 NOTE — Assessment & Plan Note (Signed)
Reviewed with patient, discussed healthy diet changes to improve triglycerides.

## 2015-02-27 NOTE — Assessment & Plan Note (Addendum)
Advanced directives - thinks daughter Hartlyn Cleopatra Cedar would be HCPOA. Packet provided today because she does not have at home.

## 2015-02-27 NOTE — Assessment & Plan Note (Signed)
Continue oxycodone 7.5mg  TID PRN.

## 2015-02-27 NOTE — Assessment & Plan Note (Signed)
Discussed abnormal UDS negative for xanax. Pt states taking meds appropriately, denies diverting meds. Advised if persistently abnormal will need to change anxiety regimen (would simplify to just long acting benzo).

## 2015-02-27 NOTE — Assessment & Plan Note (Signed)

## 2015-02-27 NOTE — Assessment & Plan Note (Addendum)
Hep B shot today - as recent Hep B s Ab negative despite completing Hep A/B series per patient.
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
## 2015-02-27 NOTE — Assessment & Plan Note (Signed)
rec f/u with Dr Gavin Potters.

## 2015-02-28 ENCOUNTER — Telehealth: Payer: Self-pay | Admitting: *Deleted

## 2015-02-28 ENCOUNTER — Telehealth: Payer: Self-pay | Admitting: Family Medicine

## 2015-02-28 NOTE — Telephone Encounter (Signed)
Patient called and wants to know if there is a blood test to prove that she is taking her valuim and Xanax. Her primary care doctor is stating that she is not taking the medicine but selling them instead.

## 2015-02-28 NOTE — Telephone Encounter (Signed)
S/w pt who insists Dr. Mariah Milling did prescribe xanax and valium and she wants him to prescribe her either xanax or valium. States that since she is taking both, it makes urine "not right" and now her PCP thinks she is selling one of the drugs. Reviewed again Dr. Windell Hummingbird response today. Pt continued to insist he prescribes them to her and she will "discuss it with him at her 8/23 appt" Pt stated she had no other questions and hung up

## 2015-02-28 NOTE — Telephone Encounter (Signed)
noted 

## 2015-02-28 NOTE — Telephone Encounter (Signed)
Pt states that she was seen by her PCP recently and he checked a UDS and said that "levels were not right in my urine" for the amount of Xanax and Valium she is taking. Pt states that Dr. Mariah Milling had originally ordered these medications at the hospital and then PCP took over once discharged. Pt states PCP said that he believes she is not taking her medicines as prescribed and is selling some. Pt states that she takes her medicine exactly as written and feels that the by taking two benzos "one is overriding the other" and causing her levels to be inaccurate. Pt states that she would like for Dr. Mariah Milling to check her blood work while she is in the office next week to prove to her PCP that she is taking her medications. Pt states that she would also like for Dr. Mariah Milling to prescribe her either all Xanax or all Valium because she feels that "one is overriding the other" on her UDS. Informed pt that I would route this information to Dr. Mariah Milling and someone would be in contact once we hear back from him. Pt verbalized understanding and was very appreciative for call.

## 2015-02-28 NOTE — Telephone Encounter (Signed)
Fax from pharmacy stating patient wants to change from [place] to [facility] 625mg . Ok to change?

## 2015-02-28 NOTE — Telephone Encounter (Signed)
We've extensively discussed this yesterday. Recommend continue plan as discussed at office visit with recheck UDS next month.

## 2015-02-28 NOTE — Telephone Encounter (Signed)
Spoke with patient and notified her of your recommendations. She said that you were very mean to her yesterday and called her a liar and accused her of selling her meds. She is still adamant that the diazepam is overriding the xanax. I explained that it wouldn't do that since the test is testing for everything and the amount of it in her system. So then she accused me of calling her a liar as well. I explained that I wasn't, only explaining the test. She said the whole thing is stupid and uncalled for and hoped that I told you so. After 14 years with [facility], she will leave and go somewhere else. She said she will consult with her cardiologist and hung up on me.

## 2015-02-28 NOTE — Telephone Encounter (Signed)
I do not prescribe these in the office, or from the hospital Only cardiac meds I do not know of any testing she is referring to concerning urine testing Will defer to primary care

## 2015-02-28 NOTE — Telephone Encounter (Signed)
Pt called and is very upset b/c she says labs are not showing up that she is not taking xanax.  She wants to make sure that when her urine is tested that it is being tested for 2 "benzo" medications b/c she knows that most people only take 1 benzo and is worried that the lab is only testing for 1 due to that reason. She's willing to have a blood draw to prove she's taking her meds, but says you refuse.  Pt is very upset, tearful, and adamant that she is taking her med and is not selling it.  Pt says she's been awake all night crying. Very upset.  Please advise.  She says please call her phone twice b/c 1st time will go to v/m but if you call right back it will ring thru.

## 2015-03-01 ENCOUNTER — Encounter: Payer: Self-pay | Admitting: Family Medicine

## 2015-03-03 MED ORDER — COLESEVELAM HCL 625 MG PO TABS: 1250.0000 mg | ORAL_TABLET | Freq: Every day | ORAL | Status: DC

## 2015-03-03 NOTE — Telephone Encounter (Signed)
Ok to change, sent in.  Would recommend recheck chol levels 3-4 months after starting welchol.Kaylee [name]

## 2015-03-03 NOTE — Addendum Note (Signed)
Addended by: [name] on: 03/03/2015 01:22 PM   Modules accepted: Kipp Brood

## 2015-03-05 ENCOUNTER — Other Ambulatory Visit: Payer: Self-pay | Admitting: Family Medicine

## 2015-03-05 ENCOUNTER — Encounter: Payer: Self-pay | Admitting: Cardiovascular Disease

## 2015-03-05 ENCOUNTER — Ambulatory Visit (INDEPENDENT_AMBULATORY_CARE_PROVIDER_SITE_OTHER): Payer: Medicare Other | Admitting: Cardiovascular Disease

## 2015-03-05 VITALS — BP 120/76 | HR 66 | Ht 65.0 in | Wt 187.8 lb

## 2015-03-05 DIAGNOSIS — R002 Palpitations: Secondary | ICD-10-CM

## 2015-03-05 DIAGNOSIS — E785 Hyperlipidemia, unspecified: Secondary | ICD-10-CM

## 2015-03-05 DIAGNOSIS — R0602 Shortness of breath: Secondary | ICD-10-CM

## 2015-03-05 DIAGNOSIS — R0789 Other chest pain: Secondary | ICD-10-CM

## 2015-03-05 DIAGNOSIS — R079 Chest pain, unspecified: Secondary | ICD-10-CM | POA: Diagnosis not present

## 2015-03-05 MED ORDER — ATORVASTATIN CALCIUM 10 MG PO TABS: 10.0000 mg | ORAL_TABLET | Freq: Every day | ORAL | Status: DC

## 2015-03-05 NOTE — Assessment & Plan Note (Signed)
No further workup at this time Chest pain that resents periodically likely secondary to family stressors, anxiety

## 2015-03-05 NOTE — Assessment & Plan Note (Signed)
Long smoking history, likely has COPD She reports feeling jittery on albuterol. Unclear if she would benefit from stimulant inhaler. She reports feeling better on Advair. Suggested if symptoms get worse, she could see pulmonary, they could perform PFTs and guide therapy

## 2015-03-05 NOTE — Patient Instructions (Addendum)
You are doing well.  We will help get you set up with pulmonary  Please hold the welchol Start lipitor 10 mg once a day  Please call us if you have new issues that need to be addressed before your next appt.  Your physician wants you to follow-up in: 12 months.  You will receive a reminder letter in the mail two months in advance. If you don't receive a letter, please call our office to schedule the follow-up appointment.

## 2015-03-05 NOTE — Assessment & Plan Note (Signed)
We have recommended she start Lipitor 10 mg daily She would  like to be aggressive with her cholesterol despite no significant CAD
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
## 2015-03-11 DIAGNOSIS — M150 Primary generalized (osteo)arthritis: Secondary | ICD-10-CM | POA: Diagnosis not present

## 2015-03-11 DIAGNOSIS — M1990 Unspecified osteoarthritis, unspecified site: Secondary | ICD-10-CM | POA: Diagnosis not present

## 2015-03-11 DIAGNOSIS — M791 Myalgia: Secondary | ICD-10-CM | POA: Diagnosis not present

## 2015-03-12 ENCOUNTER — Ambulatory Visit (INDEPENDENT_AMBULATORY_CARE_PROVIDER_SITE_OTHER): Payer: Medicare Other | Admitting: Internal Medicine

## 2015-03-12 ENCOUNTER — Encounter: Payer: Self-pay | Admitting: Internal Medicine

## 2015-03-12 ENCOUNTER — Encounter: Payer: Self-pay | Admitting: Family Medicine

## 2015-03-12 VITALS — BP 120/66 | HR 67 | Ht 65.0 in | Wt 189.0 lb

## 2015-03-12 DIAGNOSIS — J42 Unspecified chronic bronchitis: Secondary | ICD-10-CM

## 2015-03-12 DIAGNOSIS — J329 Chronic sinusitis, unspecified: Secondary | ICD-10-CM | POA: Insufficient documentation

## 2015-03-12 DIAGNOSIS — J40 Bronchitis, not specified as acute or chronic: Secondary | ICD-10-CM

## 2015-03-12 MED ORDER — FLUTICASONE-SALMETEROL 250-50 MCG/DOSE IN AEPB: 1.0000 | INHALATION_SPRAY | Freq: Two times a day (BID) | RESPIRATORY_TRACT | Status: DC

## 2015-03-12 MED ORDER — TIOTROPIUM BROMIDE MONOHYDRATE 18 MCG IN CAPS: 18.0000 ug | ORAL_CAPSULE | Freq: Every day | RESPIRATORY_TRACT | Status: DC
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
## 2015-03-12 NOTE — Assessment & Plan Note (Signed)
-  findings c/w COPD -will prescribe spiriva and advair -albuterol as needed

## 2015-03-12 NOTE — Progress Notes (Signed)
   Subjective:    Patient ID: Kaylee [name], female    DOB: 29-Sep-1960, 54 y.o.   MRN: [id number]  HPI    Review of Systems  Constitutional: Negative for fever and unexpected weight change.  HENT: Negative for congestion, dental problem, ear pain, nosebleeds, postnasal drip, rhinorrhea, sinus pressure, sneezing, sore throat and trouble swallowing.   Eyes: Negative for redness and itching.  Respiratory: Positive for cough and shortness of breath. Negative for chest tightness and wheezing.   Cardiovascular: Negative for palpitations and leg swelling.  Gastrointestinal: Negative for nausea and vomiting.  Genitourinary: Negative for dysuria.  Musculoskeletal: Negative for joint swelling.  Skin: Negative for rash.  Neurological: Negative for headaches.  Hematological: Does not bruise/bleed easily.  Psychiatric/Behavioral: Negative for dysphoric mood. The patient is not nervous/anxious.        Objective:   Physical Exam        Assessment & Plan:

## 2015-03-12 NOTE — Patient Instructions (Signed)

## 2015-03-20 ENCOUNTER — Telehealth: Payer: Self-pay | Admitting: Internal Medicine

## 2015-03-20 ENCOUNTER — Other Ambulatory Visit: Payer: Self-pay

## 2015-03-20 DIAGNOSIS — Z1231 Encounter for screening mammogram for malignant neoplasm of breast: Secondary | ICD-10-CM

## 2015-03-20 DIAGNOSIS — J42 Unspecified chronic bronchitis: Secondary | ICD-10-CM

## 2015-03-20 DIAGNOSIS — J209 Acute bronchitis, unspecified: Secondary | ICD-10-CM

## 2015-03-20 MED ORDER — AMOXICILLIN-POT CLAVULANATE 875-125 MG PO TABS: 1.0000 | ORAL_TABLET | Freq: Two times a day (BID) | ORAL | Status: DC

## 2015-03-20 MED ORDER — LEVALBUTEROL HCL 1.25 MG/0.5ML IN NEBU: 1.2500 mg | INHALATION_SOLUTION | RESPIRATORY_TRACT | Status: DC | PRN

## 2015-03-20 MED ORDER — PREDNISONE 20 MG PO TABS: ORAL_TABLET | ORAL | Status: DC

## 2015-03-20 MED ORDER — AMOXICILLIN-POT CLAVULANATE 875-125 MG PO TABS: 1.0000 | ORAL_TABLET | Freq: Three times a day (TID) | ORAL | Status: DC

## 2015-03-20 NOTE — Telephone Encounter (Signed)
I have called patient and talked to her about her symtpoms. WiIl prescribe augmentin and prednisone with Levalbuterol neb therapy/ Patient satisfied with plan of action

## 2015-03-20 NOTE — Telephone Encounter (Signed)
Thank you.  Spoke with Victorino Dike, Audiological scientist, with pharmacy.  States they need to verify augmentin rx prior to being able to fill -- wants to verify order is augmentin 875 tid and not bid.    Called, spoke with Dr. Belia Heman.  Per Dr. Belia Heman, ok for augmentin 875 bid x 10 days, not tid.  Change sent electronically to pharmacy.  Also, spoke with Victorino Dike with pharmacy and advised of change.  She will delete the augmentin 875 tid rx.    lmomtcb for pt to ensure she is aware to take abx bid.

## 2015-03-20 NOTE — Telephone Encounter (Signed)
Spoke with pt.  She is aware to take abx bid.  Pt verbalized understanding, is in agreement with below plans, and voiced no further questions or concerns at this time.

## 2015-03-20 NOTE — Telephone Encounter (Addendum)
Called number provided above, spoke with Jennifer,certified tech, with [employer] who states pt asked her to call.  States she advised pt that pt needed to call office but pt insisted she call for her.  Quincy Sheehan I would need to speak with pt and would call her.  Called, spoke with pt.  C/o increased cough with clear to yellow mucus, sweating, chest tightness, increased SOB "all the time," and hoarseness.  Pt with periods of laryngitis during our conversation.  States she is using albuterol hfa with "very little" relief.  Is also taking tussin.  Started guaifenesin today along with 5 mg of prednisone.  Pt also continuing to use maintenance Advair and Spiriva.  Pt states she was started on methotrexate by Rheumatology and was advised by Dr. Belia Heman to call our office for abx when symptoms start d/t increased risk of pna.  Pt is requesting Dr. Belia Heman send in abx for symptoms.  Dr. Belia Heman, please advise.  Thank you.

## 2015-03-20 NOTE — Telephone Encounter (Signed)
Spoke with Fayrene Fearing with [facility].  States they do not have this pt in their system.    Xopenex Neb was sent to [facility] earlier.  I called them, spoke with [facility] and was advised they do have medication, will be able to fill for pt, and hasn't sent anything to [facility].    Spoke with pt.  States she called her insurance, [facility], because she doesn't have a neb machine and was advised she should get this through [place] as they are in network.  Pt states she then called [facility] to request this.  Advised I would place order.  Pt verbalized understanding and voiced no further questions or concerns at this time.

## 2015-03-21 DIAGNOSIS — J449 Chronic obstructive pulmonary disease, unspecified: Secondary | ICD-10-CM | POA: Diagnosis not present

## 2015-03-22 ENCOUNTER — Telehealth: Payer: Self-pay | Admitting: *Deleted

## 2015-03-22 ENCOUNTER — Other Ambulatory Visit: Payer: Self-pay

## 2015-03-22 ENCOUNTER — Ambulatory Visit
Admission: RE | Admit: 2015-03-22 | Discharge: 2015-03-22 | Disposition: A | Discharge status: Home / Self Care | Payer: Medicare Other | Source: Ambulatory Visit

## 2015-03-22 ENCOUNTER — Other Ambulatory Visit: Payer: Self-pay | Admitting: Family Medicine

## 2015-03-22 DIAGNOSIS — Z1231 Encounter for screening mammogram for malignant neoplasm of breast: Secondary | ICD-10-CM | POA: Diagnosis not present

## 2015-03-22 MED ORDER — ALPRAZOLAM 1 MG PO TABS: 1.0000 mg | ORAL_TABLET | Freq: Two times a day (BID) | ORAL | Status: DC

## 2015-03-22 MED ORDER — DIAZEPAM 10 MG PO TABS: 10.0000 mg | ORAL_TABLET | Freq: Two times a day (BID) | ORAL | Status: DC

## 2015-03-22 MED ORDER — OXYCODONE-ACETAMINOPHEN 7.5-325 MG PO TABS: ORAL_TABLET | ORAL | Status: DC

## 2015-03-22 NOTE — Telephone Encounter (Signed)
Pharmacy has sent over a request for the pt wanting Fluconazole 150mg  tab to go with the Augmentin you sent on 03/20/15. Please advise if I can send. Thanks

## 2015-03-22 NOTE — Telephone Encounter (Signed)
Will allow Gina to handle.

## 2015-03-22 NOTE — Telephone Encounter (Signed)
Pt left v/m requesting rx for alprazolam(last rx filled # 60 on 02/27/15),diazepam (last rx filled # 60 on 02/27/15),and oxycodone ap[facility]. (last rx filled # 90 on 02/27/15); last annual exam on 02/27/15. Pt getting mammogram this afternoon and request to pick up rxs today since pt now lives in [place].Please advise.

## 2015-03-22 NOTE — Telephone Encounter (Signed)
Late entry - I did not call her a liar nor accuse her of selling her medications. I was straightforward in explaining her inappropriately negative drug screens and the reason for more frequent monitoring given abnormal drug screens.

## 2015-03-22 NOTE — Telephone Encounter (Signed)
Ok to refill 

## 2015-03-22 NOTE — Telephone Encounter (Signed)
No problem. Go ahead

## 2015-03-22 NOTE — Telephone Encounter (Signed)
Printed and in Kim's box.  However, as of phone note 8/18 pt stated she would no longer be coming to our office for care.  This will be last prescription filled by [place] to give her time to find another provider local to where she lives (has now moved to [facility]).  Gave refill to Linda/Gina

## 2015-03-25 LAB — HM MAMMOGRAPHY: HM Mammogram: NORMAL

## 2015-03-25 MED ORDER — FLUCONAZOLE 150 MG PO TABS: 150.0000 mg | ORAL_TABLET | Freq: Every day | ORAL | Status: DC

## 2015-03-25 NOTE — Telephone Encounter (Signed)
Medication sent to pharmacy. Nothing further needed. 

## 2015-03-26 NOTE — Telephone Encounter (Signed)
Called to discuss pt's care. No answer. Left message. As of last phone note 8/18 - pt not satisfied with care at our office and would be finding a new doctor (and she's moved to [facility]). Then this month she's requesting refills of controlled substances. See below.  We need clarity on patient's plans, and if patient not satisfied with our care probably best not to continue receiving care here. Will route to [place].

## 2015-03-27 ENCOUNTER — Encounter: Payer: Self-pay | Admitting: *Deleted

## 2015-03-27 ENCOUNTER — Other Ambulatory Visit: Payer: Self-pay | Admitting: *Deleted

## 2015-03-27 ENCOUNTER — Other Ambulatory Visit: Payer: Self-pay | Admitting: Family Medicine

## 2015-03-27 NOTE — Telephone Encounter (Signed)
Needs UDS 

## 2015-03-27 NOTE — Telephone Encounter (Signed)
Spoke to patient.  She denies desire to transfer care.  She has moved, but prefers to continue care at [facility].  Patient advised I will forward this information to Dr. Sharen Hones for his review.  In the meantime, prescription refills are ready for pick up.  Patient states she will pick those up today.  She had to leave town for a family emergency and was unable to pick them up prior to today.

## 2015-03-27 NOTE — Telephone Encounter (Signed)
Patient has already picked up prescriptions.  Will plan for UDS at next refill.

## 2015-03-27 NOTE — Telephone Encounter (Signed)
Received another refill request for these today. Refused them through [employer]. Spoke with Almira Coaster and hard copy was still here where patient had not picked it up when she requested it last week. She will call and speak to patient.

## 2015-03-29 ENCOUNTER — Ambulatory Visit (INDEPENDENT_AMBULATORY_CARE_PROVIDER_SITE_OTHER): Payer: Medicare Other | Admitting: Family Medicine

## 2015-03-29 ENCOUNTER — Encounter: Payer: Self-pay | Admitting: Family Medicine

## 2015-03-29 VITALS — BP 108/58 | HR 80 | Temp 98.2°F | Wt 186.5 lb

## 2015-03-29 DIAGNOSIS — R892 Abnormal level of other drugs, medicaments and biological substances in specimens from other organs, systems and tissues: Secondary | ICD-10-CM

## 2015-03-29 DIAGNOSIS — Z23 Encounter for immunization: Secondary | ICD-10-CM | POA: Diagnosis not present

## 2015-03-29 DIAGNOSIS — M79671 Pain in right foot: Secondary | ICD-10-CM | POA: Diagnosis not present

## 2015-03-29 DIAGNOSIS — Z79891 Long term (current) use of opiate analgesic: Secondary | ICD-10-CM | POA: Diagnosis not present
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
## 2015-03-29 NOTE — Assessment & Plan Note (Signed)
Update UDS today. Pt states she took 2 xanax today.

## 2015-03-29 NOTE — Progress Notes (Signed)
Pre visit review using our clinic review tool, if applicable. No additional management support is needed unless otherwise documented below in the visit note. 

## 2015-03-29 NOTE — Patient Instructions (Addendum)
Urine drug screen today. Pass by Marion's office for referral to podiatrist. Continue using good arch support shoes. Ice to foot can help.

## 2015-03-29 NOTE — Assessment & Plan Note (Signed)
?  arthritis vs bursitis of proximal R 1st MTP. Not consistent with navicular or MT stress fracture, overall preserved longitudinal arches.  Will refer to podiatry per pt and her rheumatologist's request. Pt declines xray of foot today.

## 2015-03-29 NOTE — Addendum Note (Signed)
Addended by: Josph Macho A on: 03/29/2015 04:29 PM   Modules accepted: Orders

## 2015-04-02 ENCOUNTER — Other Ambulatory Visit: Payer: Self-pay | Admitting: *Deleted

## 2015-04-02 DIAGNOSIS — M150 Primary generalized (osteo)arthritis: Secondary | ICD-10-CM | POA: Diagnosis not present

## 2015-04-02 DIAGNOSIS — M1990 Unspecified osteoarthritis, unspecified site: Secondary | ICD-10-CM | POA: Diagnosis not present

## 2015-04-02 DIAGNOSIS — M791 Myalgia: Secondary | ICD-10-CM | POA: Diagnosis not present

## 2015-04-02 MED ORDER — ALBUTEROL SULFATE HFA 108 (90 BASE) MCG/ACT IN AERS: 2.0000 | INHALATION_SPRAY | Freq: Four times a day (QID) | RESPIRATORY_TRACT | Status: DC | PRN

## 2015-04-09 ENCOUNTER — Other Ambulatory Visit: Payer: Self-pay | Admitting: *Deleted

## 2015-04-09 DIAGNOSIS — M1990 Unspecified osteoarthritis, unspecified site: Secondary | ICD-10-CM | POA: Diagnosis not present

## 2015-04-09 DIAGNOSIS — M150 Primary generalized (osteo)arthritis: Secondary | ICD-10-CM | POA: Diagnosis not present

## 2015-04-09 DIAGNOSIS — J449 Chronic obstructive pulmonary disease, unspecified: Secondary | ICD-10-CM | POA: Diagnosis not present

## 2015-04-09 NOTE — Telephone Encounter (Signed)
Patient called requesting a refill on muscle relaxer.  Per rheum, okay to continue taking.  Last filled #60 02/27/15.  Okay to refill.  Patient requests call back at [phone].

## 2015-04-10 ENCOUNTER — Ambulatory Visit: Payer: Self-pay | Admitting: Podiatry

## 2015-04-10 ENCOUNTER — Other Ambulatory Visit: Payer: Self-pay | Admitting: *Deleted

## 2015-04-10 MED ORDER — METHOCARBAMOL 500 MG PO TABS: ORAL_TABLET | ORAL | Status: DC

## 2015-04-10 MED ORDER — ETODOLAC 400 MG PO TABS: 400.0000 mg | ORAL_TABLET | Freq: Two times a day (BID) | ORAL | Status: DC

## 2015-04-10 NOTE — Telephone Encounter (Signed)
Sent in electronically .  

## 2015-04-10 NOTE — Telephone Encounter (Signed)
Ok to refill 

## 2015-04-10 NOTE — Telephone Encounter (Signed)
Sent in eletronically

## 2015-04-11 ENCOUNTER — Encounter: Payer: Self-pay | Admitting: Family Medicine

## 2015-04-11 NOTE — Telephone Encounter (Signed)
Patient notified via detailed voice mail, per DPR.

## 2015-04-17 ENCOUNTER — Encounter: Payer: Self-pay | Admitting: Podiatry

## 2015-04-17 ENCOUNTER — Ambulatory Visit (INDEPENDENT_AMBULATORY_CARE_PROVIDER_SITE_OTHER): Payer: Medicare Other

## 2015-04-17 ENCOUNTER — Other Ambulatory Visit: Payer: Self-pay | Admitting: Podiatry

## 2015-04-17 ENCOUNTER — Ambulatory Visit (INDEPENDENT_AMBULATORY_CARE_PROVIDER_SITE_OTHER): Payer: Medicare Other | Admitting: Podiatry

## 2015-04-17 ENCOUNTER — Encounter: Payer: Self-pay | Admitting: *Deleted

## 2015-04-17 VITALS — BP 128/73 | HR 60 | Resp 16

## 2015-04-17 DIAGNOSIS — M79671 Pain in right foot: Secondary | ICD-10-CM

## 2015-04-17 DIAGNOSIS — M76821 Posterior tibial tendinitis, right leg: Secondary | ICD-10-CM | POA: Diagnosis not present

## 2015-04-17 DIAGNOSIS — M779 Enthesopathy, unspecified: Secondary | ICD-10-CM | POA: Diagnosis not present

## 2015-04-17 DIAGNOSIS — M129 Arthropathy, unspecified: Secondary | ICD-10-CM | POA: Diagnosis not present

## 2015-04-17 DIAGNOSIS — M7662 Achilles tendinitis, left leg: Secondary | ICD-10-CM

## 2015-04-17 DIAGNOSIS — M79672 Pain in left foot: Secondary | ICD-10-CM

## 2015-04-17 DIAGNOSIS — M19079 Primary osteoarthritis, unspecified ankle and foot: Secondary | ICD-10-CM

## 2015-04-17 NOTE — Progress Notes (Signed)
   Subjective:    Patient ID: Kaylee [name], female    DOB: 09/23/1960, 54 y.o.   MRN: [id number]  HPI: She presents today with chief complaint of pain to the medial aspect with radiating burning sensation from the medial ankle extending to the level of the mid first metatarsal shaft. This is on the right foot. She states that she has injured this foot multitude of times with rodeos and horseback riding. She states that she has sprained this foot many times. She denies fractures. She also states that she has some burning pain to the posterior aspect of the Achilles left says that this is particularly bad in the mornings and is swollen in the mornings. She denies any trauma this area. She wears horseback riding boots most of the time. She states the flaps bother her. She does not think for this.    Review of Systems  Constitutional: Positive for activity change, appetite change and fatigue.  HENT: Positive for sinus pressure.   Respiratory: Positive for apnea, cough, shortness of breath and wheezing.   Cardiovascular: Positive for palpitations and leg swelling.  Gastrointestinal: Positive for constipation and abdominal distention.  Musculoskeletal: Positive for myalgias, back pain and arthralgias.  Allergic/Immunologic: Positive for food allergies.  Neurological: Positive for headaches.  Hematological: Bruises/bleeds easily.  Psychiatric/Behavioral: The patient is nervous/anxious.   All other systems reviewed and are negative.      Objective:   Physical Exam: 54 year old female presents today vital signs stable alert and oriented 3 no apparent distress. His are strongly palpable neurologic sensorium is intact percent lasting monofilament. Deep tendon reflexes are intact bilateral muscle strength +5 over 5 dorsiflexion plantar flexors inverters and everters all intrinsic musculature is intact. She has pain against resistance along the medial aspect of the posterior tibial tendon right foot.  There is no erythema edema cellulitis drainage or odor in this area. She also has tenderness on palpation of the Achilles of the left foot and heel without bursitis. Radiographic evaluation the bilateral foot doesn't straight osteoarthritic changes of subtalar joint and Lisfranc's joints of the right foot to lesser degree on the left foot. Though there is some soft tissue swelling along the medial aspect of the right foot very minimal swelling in the posterior aspect of the left foot. Cutaneous evaluation demonstrates supple well-hydrated cutis no erythema edema saline drainage or odor.        Assessment & Plan:  Assessment: Posterior tibial tendinitis right foot. Subtalar joint osteoarthritis Lisfranc osteoarthritis lateral. Insertional Achilles tendinitis left foot.  Plan: Discussed etiology pathology conservative versus surgical therapies. At this point I injected the posterior tibial tendon sheath with Kenalog and local and aesthetic. Injected subcutaneously to the posterior left heel with dexamethasone and local and aesthetic. We also skinned her for set of orthotics. I will follow-up with her in 1 month or so.  Arbutus Ped DPM

## 2015-04-24 ENCOUNTER — Telehealth: Payer: Self-pay | Admitting: Family Medicine

## 2015-04-24 ENCOUNTER — Other Ambulatory Visit: Payer: Self-pay | Admitting: *Deleted

## 2015-04-24 NOTE — Telephone Encounter (Signed)
Noted. Given to [facility] this AM.

## 2015-04-24 NOTE — Telephone Encounter (Signed)
Ok to refill 

## 2015-04-24 NOTE — Telephone Encounter (Signed)
Pt called wanting to get 2 living wills.  She lost the last one dr g gave her. She will pick this up when she gets her rx next week

## 2015-04-25 MED ORDER — OXYCODONE-ACETAMINOPHEN 7.5-325 MG PO TABS: ORAL_TABLET | ORAL | Status: DC

## 2015-04-25 NOTE — Telephone Encounter (Addendum)
Printed results from [facility]. In your IN box for review. Will wait to call patient since percocet was negative on UDS.

## 2015-04-25 NOTE — Telephone Encounter (Signed)
Printed and in Kim's box. Pt should have had UDS done 03/29/2015 but not in system. Can we check with lab? If did not do, will need UDS updated.

## 2015-04-26 NOTE — Telephone Encounter (Signed)
Message left advising patient and Rx placed up front for pick up. 

## 2015-04-26 NOTE — Telephone Encounter (Signed)
Ok to fill 

## 2015-04-29 ENCOUNTER — Other Ambulatory Visit: Payer: Self-pay | Admitting: *Deleted

## 2015-04-29 NOTE — Telephone Encounter (Signed)
Ok to refill 

## 2015-04-30 MED ORDER — METHOCARBAMOL 500 MG PO TABS: ORAL_TABLET | ORAL | Status: DC

## 2015-04-30 MED ORDER — NITROFURANTOIN MACROCRYSTAL 100 MG PO CAPS: ORAL_CAPSULE | ORAL | Status: DC

## 2015-05-01 ENCOUNTER — Telehealth: Payer: Self-pay

## 2015-05-01 ENCOUNTER — Other Ambulatory Visit: Payer: Self-pay | Admitting: *Deleted

## 2015-05-01 NOTE — Telephone Encounter (Signed)
Ok to refill 

## 2015-05-02 MED ORDER — DIAZEPAM 10 MG PO TABS
10.0000 mg | ORAL_TABLET | Freq: Two times a day (BID) | ORAL | Status: DC
Start: 2015-05-02 — End: 2015-05-31

## 2015-05-02 MED ORDER — ALPRAZOLAM 1 MG PO TABS: 1.0000 mg | ORAL_TABLET | Freq: Two times a day (BID) | ORAL | Status: DC

## 2015-05-02 NOTE — Telephone Encounter (Signed)
Attempted to call pharmacy. No answer and no machine (? Closed for the evening). Tried to call patient and went straight to VM. Was going to call into another pharmacy for her, but with no answer, I didn't know where else to send it. Will try again tomorrow.

## 2015-05-02 NOTE — Telephone Encounter (Signed)
plz phone in. 

## 2015-05-02 NOTE — Telephone Encounter (Signed)
Pt left 2nd voicemail at Triage requesting status of refill, pt is asking for Rx to be sent in ASAP, pt said today is a "bad day" because she is out of her meds (pt started crying on phone), pt request call back once it's done

## 2015-05-02 NOTE — Telephone Encounter (Signed)
Pt left voicemail at Triage, she is out of medication and is requesting Rx to be sent in ASAP, pt said she is afraid her heart will start acting up and she will end up in the hospital if she misses any doses, pt request call back once Rx was sent

## 2015-05-03 NOTE — Telephone Encounter (Signed)
Error. Needed encounter note to close encounter in INbox.  

## 2015-05-03 NOTE — Telephone Encounter (Signed)
Rx's called into pharmacy and patient notified. She asked why it took so long for the refills. I advised that they weren't received until 05/01/15 and that they can take up to 48 hours to process.

## 2015-05-03 NOTE — Telephone Encounter (Signed)
Error. Needed encounter note to close encounter in INbox.

## 2015-05-09 ENCOUNTER — Other Ambulatory Visit: Payer: Self-pay | Admitting: *Deleted

## 2015-05-09 ENCOUNTER — Encounter: Payer: Self-pay | Admitting: Family Medicine

## 2015-05-09 MED ORDER — NYSTATIN 100000 UNIT/ML MT SUSP: 5.0000 mL | Freq: Four times a day (QID) | OROMUCOSAL | Status: DC

## 2015-05-13 ENCOUNTER — Ambulatory Visit (INDEPENDENT_AMBULATORY_CARE_PROVIDER_SITE_OTHER): Payer: Medicare Other | Admitting: Podiatry

## 2015-05-13 ENCOUNTER — Other Ambulatory Visit: Payer: Self-pay | Admitting: *Deleted

## 2015-05-13 DIAGNOSIS — M76821 Posterior tibial tendinitis, right leg: Secondary | ICD-10-CM | POA: Diagnosis not present

## 2015-05-13 MED ORDER — ETODOLAC 400 MG PO TABS: 400.0000 mg | ORAL_TABLET | Freq: Two times a day (BID) | ORAL | Status: DC

## 2015-05-13 NOTE — Telephone Encounter (Signed)
Ok to refill 

## 2015-05-13 NOTE — Progress Notes (Signed)
She presents today to pick up her orthotics. She is still having some pain along the medial aspect of the posterior tibial tendon as it courses beneath the medial malleolus extending to the navicular tuberosity.  Objective: Vital signs are stable she is alert and oriented 3. Posterior tibial tendinitis with boggy tenderness beneath the posterior tibial tendon as it courses to the navicular tuberosity.  Assessment: Posterior tibial tendinitis with plantar fasciitis right.  Plan: Injected the posterior tibial tendon area today with Kenalog and local anesthetic did not inject into the tendon itself. I did also dispensed her orthotics and providing her with both oral and written home-going instructions. I will follow up with her in 6 weeks.  Arbutus Ped DPM

## 2015-05-17 ENCOUNTER — Encounter: Payer: Self-pay | Admitting: Family Medicine

## 2015-05-31 ENCOUNTER — Ambulatory Visit (INDEPENDENT_AMBULATORY_CARE_PROVIDER_SITE_OTHER): Payer: Medicare Other | Admitting: Family Medicine

## 2015-05-31 ENCOUNTER — Encounter: Payer: Self-pay | Admitting: Family Medicine

## 2015-05-31 VITALS — BP 116/70 | HR 72 | Temp 98.1°F | Wt 185.0 lb

## 2015-05-31 DIAGNOSIS — J42 Unspecified chronic bronchitis: Secondary | ICD-10-CM

## 2015-05-31 DIAGNOSIS — G894 Chronic pain syndrome: Secondary | ICD-10-CM

## 2015-05-31 DIAGNOSIS — Z79899 Other long term (current) drug therapy: Secondary | ICD-10-CM | POA: Diagnosis not present

## 2015-05-31 DIAGNOSIS — Z79891 Long term (current) use of opiate analgesic: Secondary | ICD-10-CM | POA: Diagnosis not present

## 2015-05-31 DIAGNOSIS — N390 Urinary tract infection, site not specified: Secondary | ICD-10-CM | POA: Diagnosis not present

## 2015-05-31 DIAGNOSIS — R892 Abnormal level of other drugs, medicaments and biological substances in specimens from other organs, systems and tissues: Secondary | ICD-10-CM

## 2015-05-31 DIAGNOSIS — M79671 Pain in right foot: Secondary | ICD-10-CM

## 2015-05-31 DIAGNOSIS — F411 Generalized anxiety disorder: Secondary | ICD-10-CM

## 2015-05-31 DIAGNOSIS — M0600 Rheumatoid arthritis without rheumatoid factor, unspecified site: Secondary | ICD-10-CM

## 2015-05-31 MED ORDER — METHOCARBAMOL 500 MG PO TABS: ORAL_TABLET | ORAL | Status: DC

## 2015-05-31 MED ORDER — FUROSEMIDE 20 MG PO TABS: ORAL_TABLET | ORAL | Status: DC

## 2015-05-31 MED ORDER — POLYETHYLENE GLYCOL 3350 17 GM/SCOOP PO POWD: ORAL | Status: DC

## 2015-05-31 MED ORDER — PANTOPRAZOLE SODIUM 40 MG PO TBEC: DELAYED_RELEASE_TABLET | ORAL | Status: DC

## 2015-05-31 MED ORDER — OXYCODONE-ACETAMINOPHEN 7.5-325 MG PO TABS: ORAL_TABLET | ORAL | Status: DC

## 2015-05-31 MED ORDER — MAGIC MOUTHWASH W/LIDOCAINE: 5.0000 mL | Freq: Two times a day (BID) | ORAL | Status: DC | PRN

## 2015-05-31 MED ORDER — ETODOLAC 400 MG PO TABS: 400.0000 mg | ORAL_TABLET | Freq: Two times a day (BID) | ORAL | Status: DC

## 2015-05-31 MED ORDER — NITROFURANTOIN MACROCRYSTAL 100 MG PO CAPS: ORAL_CAPSULE | ORAL | Status: DC

## 2015-05-31 MED ORDER — FLUTICASONE PROPIONATE 50 MCG/ACT NA SUSP: 2.0000 | Freq: Every day | NASAL | Status: DC

## 2015-05-31 MED ORDER — ALPRAZOLAM 1 MG PO TABS: 1.0000 mg | ORAL_TABLET | Freq: Two times a day (BID) | ORAL | Status: DC

## 2015-05-31 MED ORDER — DIAZEPAM 10 MG PO TABS: 10.0000 mg | ORAL_TABLET | Freq: Two times a day (BID) | ORAL | Status: DC

## 2015-05-31 MED ORDER — TIOTROPIUM BROMIDE MONOHYDRATE 18 MCG IN CAPS: 18.0000 ug | ORAL_CAPSULE | Freq: Every day | RESPIRATORY_TRACT | Status: DC

## 2015-05-31 NOTE — Assessment & Plan Note (Signed)
Recheck today. 

## 2015-05-31 NOTE — Assessment & Plan Note (Signed)
Continue valium/xanax. Update UDS today.

## 2015-05-31 NOTE — Assessment & Plan Note (Signed)
Appreciate pulm care of patient. Looks like PFTs pending. Pt endorses compliance with spiriva/advair.

## 2015-05-31 NOTE — Assessment & Plan Note (Signed)
Discussed concerns with prolonged macrodantin (predominantly pulm fibrosis). Suggested she address lung concerns with pulm ,advised if dyspnea developing will need to readdress chronic macrodantin use.

## 2015-05-31 NOTE — Progress Notes (Signed)
BP 116/70 mmHg  Pulse 72  Temp(Src) 98.1 F (36.7 C) (Oral)  Wt 185 lb (83.915 kg)  SpO2 95%   CC: 3 mo f/u visit  Subjective:    Patient ID: Kaylee [name], female    DOB: 05-Jun-1961, 54 y.o.   MRN: [id number]  HPI: Kimimila A Coggeshall is a 54 y.o. female presenting on 05/31/2015 for Follow-up   Family member recently dx with ovarian cancer.  Bilateral foot pain - referred to podiatry and undergoing treatment for posterior tibial tendonitis with plantar fascitis (kenalog/anesthesia injection). Placed in orthotics.   Quiet inflammatory arthritis - on MTX + folic acid. On prednisone PRN flares. Last course 03/2015.   Abnormal UDS - latest one ok.   Chronic recurrent UTI - stable on macrodantin 100mg  daily. Denies significant dyspnea except for when she takes MTX.   Increased hoarse voice she associates with MTX use. She has been treating with tussin DM OTC and mucinex D. + cough with mucous production. No sinus congestion.   Relevant past medical, surgical, family and social history reviewed and updated as indicated. Interim medical history since our last visit reviewed. Allergies and medications reviewed and updated. Current Outpatient Prescriptions on File Prior to Visit  Medication Sig  . albuterol (PROVENTIL HFA;VENTOLIN HFA) 108 (90 BASE) MCG/ACT inhaler Inhale 2 puffs into the lungs every 6 (six) hours as needed.  aspirin 81 MG tablet Take 81 mg by mouth daily.  [name] atorvastatin (LIPITOR) 10 MG tablet Take 1 tablet (10 mg total) by mouth daily.  [name] b complex vitamins tablet Take 1 tablet by mouth daily.  [name] BIOTIN PO Take by mouth daily.  . calcium-vitamin D (OSCAL) 250-125 MG-UNIT per tablet Take 2 tablets by mouth daily.  . Cyanocobalamin (B-12) 1000 MCG CAPS Take 1 capsule by mouth daily.  [name] dicyclomine (BENTYL) 10 MG capsule Take 10 mg by mouth every 6 (six) hours.  [name] EPINEPHrine 0.3 mg/0.3 mL IJ SOAJ injection Inject 0.3 mg into the muscle once. For quinolone  reaction  . fluconazole (DIFLUCAN) 150 MG tablet Take 1 tablet (150 mg total) by mouth daily.  . Fluticasone-Salmeterol (ADVAIR DISKUS) 250-50 MCG/DOSE AEPB Inhale 1 puff into the lungs 2 (two) times daily.  [name] levalbuterol (XOPENEX) 1.25 MG/0.5ML nebulizer solution Take 1.25 mg by nebulization every 4 (four) hours as needed for wheezing or shortness of breath.  . magnesium oxide (MAG-OX) 400 MG tablet Take 400 mg by mouth daily.  . methotrexate (RHEUMATREX) 2.5 MG tablet Take 15 mg by mouth once a week. Caution:Chemotherapy. Protect from light.  . Multiple Vitamins-Minerals (PRESERVISION AREDS PO) Take by mouth 2 (two) times daily.  [name] neomycin-polymyxin b-dexamethasone (MAXITROL) 3.5-10000-0.1 SUSP PUT ONE DROP INTO THE LEFT EYE AS NEEDED  . neomycin-polymyxin-hydrocortisone (CORTISPORIN) otic solution Place 3 drops into both ears 4 (four) times daily.  03-08-2003 nystatin (MYCOSTATIN) 100000 UNIT/ML suspension Take 5 mLs (500,000 Units total) by mouth 4 (four) times daily. Swish and swallow  . potassium chloride (K-DUR) 10 MEQ tablet ONE TABLET EACH DAY AS NEEDED   No current facility-administered medications on file prior to visit.    Review of Systems Per HPI unless specifically indicated in ROS section     Objective:    BP 116/70 mmHg  Pulse 72  Temp(Src) 98.1 F (36.7 C) (Oral)  Wt 185 lb (83.915 kg)  SpO2 95%  Wt Readings from Last 3 Encounters:  05/31/15 185 lb (83.915 kg)  03/29/15 186 lb 8 oz (84.596 kg)  03/12/15 189 lb (85.73 kg)    Physical Exam  Constitutional: She appears well-developed and well-nourished. No distress.  HENT:  Head: Normocephalic and atraumatic.  Right Ear: Hearing, tympanic membrane, external ear and ear canal normal.  Left Ear: Hearing, tympanic membrane, external ear and ear canal normal.  Nose: No mucosal edema or rhinorrhea. Right sinus exhibits no maxillary sinus tenderness and no frontal sinus tenderness. Left sinus exhibits no maxillary sinus  tenderness and no frontal sinus tenderness.  Mouth/Throat: Uvula is midline, oropharynx is clear and moist and mucous membranes are normal. No oropharyngeal exudate, posterior oropharyngeal edema, posterior oropharyngeal erythema or tonsillar abscesses.  Eyes: Conjunctivae and EOM are normal. Pupils are equal, round, and reactive to light. No scleral icterus.  Neck: Normal range of motion. Neck supple.  Cardiovascular: Normal rate, regular rhythm, normal heart sounds and intact distal pulses.   No murmur heard. Pulmonary/Chest: Effort normal and breath sounds normal. No respiratory distress. She has no wheezes. She has no rales.  Lungs clear. Transmitted upper air way wheezing.   Musculoskeletal: She exhibits no edema.  Lymphadenopathy:    She has no cervical adenopathy.  Skin: Skin is warm and dry. No rash noted.  Psychiatric: She has a normal mood and affect.  Nursing note and vitals reviewed.  Results for orders placed or performed in visit on 03/27/15  [facility] MAMMOGRAPHY  Result Value Ref Range   [facility] Mammogram Normal Birads 1-Repeat 1 year       Assessment & Plan:   Problem List Items Addressed This Visit    URINARY TRACT INFECTION, CHRONIC    Discussed concerns with prolonged macrodantin (predominantly pulm fibrosis). Suggested she address lung concerns with pulm ,advised if dyspnea developing will need to readdress chronic macrodantin use.      Relevant Medications   nitrofurantoin (MACRODANTIN) 100 MG capsule   magic mouthwash w/lidocaine SOLN   Seronegative rheumatoid arthritis (HCC)    Appreciate rheum care of patient.      Relevant Medications   oxyCODONE-acetaminophen (PERCOCET) 7.5-325 MG tablet   methocarbamol (ROBAXIN) 500 MG tablet   etodolac (LODINE) 400 MG tablet   Right foot pain    Appreciate podiatry care of patient.      GAD (generalized anxiety disorder)    Continue valium/xanax. Update UDS today.      COPD (chronic obstructive pulmonary disease) (HCC)      Appreciate pulm care of patient. Looks like PFTs pending. Pt endorses compliance with spiriva/advair.      Relevant Medications   tiotropium (SPIRIVA HANDIHALER) 18 MCG inhalation capsule   fluticasone (FLONASE) 50 MCG/ACT nasal spray   magic mouthwash w/lidocaine SOLN   Chronic pain syndrome - Primary    Refilled oxycodone, update UDS today.      Abnormal drug screen    Recheck today.          Follow up plan: Return in about 4 months (around 09/28/2015), or as needed, for follow up visit.

## 2015-05-31 NOTE — Patient Instructions (Addendum)
UDS today I've refilled your medicines today.  i've sent in flonase for you. Return to see me in 4 months for follow up.

## 2015-05-31 NOTE — Assessment & Plan Note (Signed)
Appreciate rheum care of patient.  

## 2015-05-31 NOTE — Assessment & Plan Note (Signed)
Appreciate podiatry care of patient.

## 2015-05-31 NOTE — Assessment & Plan Note (Signed)
Refilled oxycodone, update UDS today.

## 2015-05-31 NOTE — Progress Notes (Signed)
Pre visit review using our clinic review tool, if applicable. No additional management support is needed unless otherwise documented below in the visit note. 

## 2015-06-03 ENCOUNTER — Other Ambulatory Visit: Payer: Self-pay

## 2015-06-03 MED ORDER — BACITRACIN-POLYMYXIN B 500-10000 UNIT/GM OP OINT: 1.0000 "application " | TOPICAL_OINTMENT | Freq: Two times a day (BID) | OPHTHALMIC | Status: DC

## 2015-06-03 MED ORDER — NEOMYCIN-POLYMYXIN-HC 3.5-10000-1 OT SOLN: 3.0000 [drp] | Freq: Three times a day (TID) | OTIC | Status: DC

## 2015-06-03 NOTE — Telephone Encounter (Signed)
Pt left v/m requesting refill on neomycin ear drops (last refilled 10 ml X 3 on 07/17/2013) and neomycin eye drops (last refilled 5 ml on 06/19/14.) pt last seen 05/31/15. Pt request refill to [facility].

## 2015-06-03 NOTE — Telephone Encounter (Signed)
Refilled neomycin ear drops. Will refill eye drops without steroid.

## 2015-06-04 ENCOUNTER — Other Ambulatory Visit: Payer: Self-pay

## 2015-06-04 MED ORDER — POTASSIUM CHLORIDE ER 10 MEQ PO TBCR: EXTENDED_RELEASE_TABLET | ORAL | Status: DC

## 2015-06-04 NOTE — Telephone Encounter (Signed)
Antonieta Iba, MD at 03/05/2015 7:05 PM  potassium chloride (K-DUR) 10 MEQ tablet ONE TABLET EACH DAY AS NEEDED  Patient Instructions     You are doing well.  We will help get you set up with pulmonary  Please hold the welchol Start lipitor 10 mg once a day  Please call us if you have new issues that need to be addressed before your next appt.  Your physician wants you to follow-up in: 12 months.  You will receive a reminder letter in the mail two months in advance. If you don't receive a letter, please call our office to schedule the follow-up appointment

## 2015-06-08 ENCOUNTER — Encounter: Payer: Self-pay | Admitting: Family Medicine

## 2015-06-14 ENCOUNTER — Encounter: Payer: Self-pay | Admitting: Family Medicine

## 2015-06-18 DIAGNOSIS — M150 Primary generalized (osteo)arthritis: Secondary | ICD-10-CM | POA: Diagnosis not present

## 2015-06-18 DIAGNOSIS — M1990 Unspecified osteoarthritis, unspecified site: Secondary | ICD-10-CM | POA: Diagnosis not present

## 2015-06-18 DIAGNOSIS — J449 Chronic obstructive pulmonary disease, unspecified: Secondary | ICD-10-CM | POA: Diagnosis not present

## 2015-06-19 ENCOUNTER — Telehealth: Payer: Self-pay | Admitting: *Deleted

## 2015-06-19 MED ORDER — AMOXICILLIN-POT CLAVULANATE 875-125 MG PO TABS: 1.0000 | ORAL_TABLET | Freq: Three times a day (TID) | ORAL | Status: AC

## 2015-06-19 NOTE — Telephone Encounter (Signed)
Left message for patient to call back  

## 2015-06-19 NOTE — Telephone Encounter (Signed)
*  STAT* If patient is at the pharmacy, call can be transferred to refill team.   1. Which medications need to be refilled? (please list name of each medication and dose if known) Antibotic for a flare up  States she used this before and it helped really well.   2. Which pharmacy/location (including street and city if local pharmacy) is medication to be sent to? [facility]   3. Do they need a 30 day or 90 day supply? 30

## 2015-06-19 NOTE — Telephone Encounter (Signed)
augmentin 1 tab TID x 10days sent to pharmacy. Pt informed. Nothing further needed.

## 2015-06-19 NOTE — Telephone Encounter (Signed)
Please refill previous  augmentin abx

## 2015-06-19 NOTE — Telephone Encounter (Signed)
Pt states she is having prod cough with clear and green mucus, chest tightness and soreness, congestion. She does use Flonase which is causing drainage. She takes methotrexate. She has chronic bronchitis. Would like to know if you will send an abx to pharmacy b/c she is going out of town and doesn't want to be sick. Please advise.

## 2015-06-21 ENCOUNTER — Other Ambulatory Visit: Payer: Self-pay

## 2015-06-21 MED ORDER — ALPRAZOLAM 1 MG PO TABS: 1.0000 mg | ORAL_TABLET | Freq: Two times a day (BID) | ORAL | Status: DC

## 2015-06-21 MED ORDER — DIAZEPAM 10 MG PO TABS: 10.0000 mg | ORAL_TABLET | Freq: Two times a day (BID) | ORAL | Status: DC

## 2015-06-21 MED ORDER — OXYCODONE-ACETAMINOPHEN 7.5-325 MG PO TABS: ORAL_TABLET | ORAL | Status: DC

## 2015-06-21 NOTE — Telephone Encounter (Signed)
Printed and in Kim's box 

## 2015-06-21 NOTE — Telephone Encounter (Signed)
Pt left v/m requesting refill alprazolam(last refilled # 60 on 05/31/15), valium(last refilled # 60 on 05/31/15) and oxycodone apap(last printed # 90 on 05/31/15.) call when ready for pick up. [facility]. Pt is going out of town on 07/01/15 for one week. Pt request cb. Pt calling in early due to holiday and pt going out of town.

## 2015-06-24 ENCOUNTER — Ambulatory Visit: Payer: Medicare Other | Admitting: Podiatry

## 2015-06-25 DIAGNOSIS — M545 Low back pain: Secondary | ICD-10-CM | POA: Diagnosis not present

## 2015-06-25 DIAGNOSIS — M47816 Spondylosis without myelopathy or radiculopathy, lumbar region: Secondary | ICD-10-CM | POA: Diagnosis not present

## 2015-06-25 DIAGNOSIS — M150 Primary generalized (osteo)arthritis: Secondary | ICD-10-CM | POA: Diagnosis not present

## 2015-06-25 DIAGNOSIS — M1990 Unspecified osteoarthritis, unspecified site: Secondary | ICD-10-CM | POA: Diagnosis not present

## 2015-06-25 DIAGNOSIS — G8929 Other chronic pain: Secondary | ICD-10-CM | POA: Diagnosis not present

## 2015-06-25 NOTE — Telephone Encounter (Signed)
Pt left v/m requesting status of rx pickup; left v/m per DPR rxs ready for pick up at front desk.

## 2015-06-25 NOTE — Telephone Encounter (Signed)
Left detailed message on cell voicemail (DPR)  that scripts are up front ready for pickup.

## 2015-07-17 ENCOUNTER — Ambulatory Visit (INDEPENDENT_AMBULATORY_CARE_PROVIDER_SITE_OTHER): Payer: Medicare Other | Admitting: Internal Medicine

## 2015-07-17 ENCOUNTER — Encounter: Payer: Self-pay | Admitting: Internal Medicine

## 2015-07-17 VITALS — BP 128/62 | HR 77 | Ht 65.0 in | Wt 184.6 lb

## 2015-07-17 DIAGNOSIS — J441 Chronic obstructive pulmonary disease with (acute) exacerbation: Secondary | ICD-10-CM

## 2015-07-17 DIAGNOSIS — J209 Acute bronchitis, unspecified: Secondary | ICD-10-CM

## 2015-07-17 MED ORDER — CLARITHROMYCIN 500 MG PO TABS: 500.0000 mg | ORAL_TABLET | Freq: Two times a day (BID) | ORAL | Status: DC

## 2015-07-17 MED ORDER — PREDNISONE 20 MG PO TABS: ORAL_TABLET | ORAL | Status: DC

## 2015-07-17 MED ORDER — LEVALBUTEROL HCL 1.25 MG/0.5ML IN NEBU: 1.2500 mg | INHALATION_SOLUTION | RESPIRATORY_TRACT | Status: DC | PRN

## 2015-07-17 NOTE — Patient Instructions (Signed)
Chronic Obstructive Pulmonary Disease Chronic obstructive pulmonary disease (COPD) is a common lung condition in which airflow from the lungs is limited. COPD is a general term that can be used to describe many different lung problems that limit airflow, including both chronic bronchitis and emphysema. If you have COPD, your lung function will probably never return to normal, but there are measures you can take to improve lung function and make yourself feel better. CAUSES   Smoking (common).  Exposure to secondhand smoke.  Genetic problems.  Chronic inflammatory lung diseases or recurrent infections. SYMPTOMS  Shortness of breath, especially with physical activity.  Deep, persistent (chronic) cough with a large amount of thick mucus.  Wheezing.  Rapid breaths (tachypnea).  Gray or bluish discoloration (cyanosis) of the skin, especially in your fingers, toes, or lips.  Fatigue.  Weight loss.  Frequent infections or episodes when breathing symptoms become much worse (exacerbations).  Chest tightness. DIAGNOSIS Your health care provider will take a medical history and perform a physical examination to diagnose COPD. Additional tests for COPD may include:  Lung (pulmonary) function tests.  Chest X-ray.  CT scan.  Blood tests. TREATMENT  Treatment for COPD may include:  Inhaler and nebulizer medicines. These help manage the symptoms of COPD and make your breathing more comfortable.  Supplemental oxygen. Supplemental oxygen is only helpful if you have a low oxygen level in your blood.  Exercise and physical activity. These are beneficial for nearly all people with COPD.  Lung surgery or transplant.  Nutrition therapy to gain weight, if you are underweight.  Pulmonary rehabilitation. This may involve working with a team of health care providers and specialists, such as respiratory, occupational, and physical therapists. HOME CARE INSTRUCTIONS  Take all medicines  (inhaled or pills) as directed by your health care provider.  Avoid over-the-counter medicines or cough syrups that dry up your airway (such as antihistamines) and slow down the elimination of secretions unless instructed otherwise by your health care provider.  If you are a smoker, the most important thing that you can do is stop smoking. Continuing to smoke will cause further lung damage and breathing trouble. Ask your health care provider for help with quitting smoking. He or she can direct you to community resources or hospitals that provide support.  Avoid exposure to irritants such as smoke, chemicals, and fumes that aggravate your breathing.  Use oxygen therapy and pulmonary rehabilitation if directed by your health care provider. If you require home oxygen therapy, ask your health care provider whether you should purchase a pulse oximeter to measure your oxygen level at home.  Avoid contact with individuals who have a contagious illness.  Avoid extreme temperature and humidity changes.  Eat healthy foods. Eating smaller, more frequent meals and resting before meals may help you maintain your strength.  Stay active, but balance activity with periods of rest. Exercise and physical activity will help you maintain your ability to do things you want to do.  Preventing infection and hospitalization is very important when you have COPD. Make sure to receive all the vaccines your health care provider recommends, especially the pneumococcal and influenza vaccines. Ask your health care provider whether you need a pneumonia vaccine.  Learn and use relaxation techniques to manage stress.  Learn and use controlled breathing techniques as directed by your health care provider. Controlled breathing techniques include:  Pursed lip breathing. Start by breathing in (inhaling) through your nose for 1 second. Then, purse your lips as if you were   going to whistle and breathe out (exhale) through the  pursed lips for 2 seconds.  Diaphragmatic breathing. Start by putting one hand on your abdomen just above your waist. Inhale slowly through your nose. The hand on your abdomen should move out. Then purse your lips and exhale slowly. You should be able to feel the hand on your abdomen moving in as you exhale.  Learn and use controlled coughing to clear mucus from your lungs. Controlled coughing is a series of short, progressive coughs. The steps of controlled coughing are: 1. Lean your head slightly forward. 2. Breathe in deeply using diaphragmatic breathing. 3. Try to hold your breath for 3 seconds. 4. Keep your mouth slightly open while coughing twice. 5. Spit any mucus out into a tissue. 6. Rest and repeat the steps once or twice as needed. SEEK MEDICAL CARE IF:  You are coughing up more mucus than usual.  There is a change in the color or thickness of your mucus.  Your breathing is more labored than usual.  Your breathing is faster than usual. SEEK IMMEDIATE MEDICAL CARE IF:  You have shortness of breath while you are resting.  You have shortness of breath that prevents you from:  Being able to talk.  Performing your usual physical activities.  You have chest pain lasting longer than 5 minutes.  Your skin color is more cyanotic than usual.  You measure low oxygen saturations for longer than 5 minutes with a pulse oximeter. MAKE SURE YOU:  Understand these instructions.  Will watch your condition.  Will get help right away if you are not doing well or get worse.   This information is not intended to replace advice given to you by your health care provider. Make sure you discuss any questions you have with your health care provider.   Document Released: 04/08/2005 Document Revised: 07/20/2014 Document Reviewed: 02/23/2013 [employer] Interactive Patient Education 2016 [employer]  

## 2015-07-17 NOTE — Progress Notes (Signed)
Kaylee [name]      Date: 07/17/2015,   MRN# [id number] Kaylee [name] 1961/03/12 Code Status:  Hosp day:@LENGTHOFSTAYDAYS @ Referring MD: @ATDPROV @     PCP:      AdmissionWeight: 184 lb 9.6 oz (83.734 kg)                 CurrentWeight: 184 lb 9.6 oz (83.734 kg) Kaylee [name] is a 55 y.o. old female seen in [name] for chronic cough and SOB.  SYNOPSIS: patient seen for symptoms of COPD, previous heavy smoker   CHIEF COMPLAINT:   Acute SOB, wheezing, congestion    HISTORY OF PRESENT ILLNESS  Patient with acute chest congestion, cough with wheezing for past 1 week Has chills, productive cough with green sputum Currently on SPiriva and Advair, levalbuterol as needed  MEDICATIONS    Home Medication:  Current Outpatient Rx  Name  Route  Sig  Dispense  Refill  . albuterol (PROVENTIL HFA;VENTOLIN HFA) 108 (90 BASE) MCG/ACT inhaler   Inhalation   Inhale 2 puffs into the lungs every 6 (six) hours as needed.   1 Inhaler   12     Ok to substitute Proair if that is what insurance  ...   . ALPRAZolam (XANAX) 1 MG tablet   Oral   Take 1 tablet (1 mg total) by mouth 2 (two) times daily.   60 tablet   0     Fill on 07/01/2015   . aspirin 81 MG tablet   Oral   Take 81 mg by mouth daily.         [name] atorvastatin (LIPITOR) 10 MG tablet   Oral   Take 1 tablet (10 mg total) by mouth daily.   90 tablet   3   . b complex vitamins tablet   Oral   Take 1 tablet by mouth daily.         . bacitracin-polymyxin b (POLYSPORIN) ophthalmic ointment   Left Eye   Place 1 application into the left eye every 12 (twelve) hours.   3.5 g   1   . BIOTIN PO   Oral   Take by mouth daily.         . calcium-vitamin D (OSCAL) 250-125 MG-UNIT per tablet   Oral   Take 2 tablets by mouth daily.         . Cyanocobalamin (B-12) 1000 MCG CAPS   Oral   Take 1 capsule by mouth daily.   90 capsule   1   . dextromethorphan 15 MG/5ML syrup   Oral   Take 10 mLs by mouth 4 (four) times daily as needed for cough.         . dextromethorphan-guaiFENesin (MUCINEX DM) 30-600 MG 12hr tablet   Oral   Take 1 tablet by mouth 2 (two) times daily.         . diazepam (VALIUM) 10 MG tablet   Oral   Take 1 tablet (10 mg total) by mouth 2 (two) times daily.   60 tablet   0     Fill on 07/01/2015   . dicyclomine (BENTYL) 10 MG capsule   Oral   Take 10 mg by mouth every 6 (six) hours.         [name] EPINEPHrine 0.3 mg/0.3 mL IJ SOAJ injection   Intramuscular   Inject 0.3 mg into the muscle once. For quinolone reaction         . etodolac (LODINE) 400 MG tablet  Oral   Take 1 tablet (400 mg total) by mouth 2 (two) times daily.   180 tablet   3   . fluconazole (DIFLUCAN) 150 MG tablet   Oral   Take 1 tablet (150 mg total) by mouth daily.   1 tablet   0   . fluticasone (FLONASE) 50 MCG/ACT nasal spray   Each Nare   Place 2 sprays into both nostrils daily.   16 g   6   . Fluticasone-Salmeterol (ADVAIR DISKUS) 250-50 MCG/DOSE AEPB   Inhalation   Inhale 1 puff into the lungs 2 (two) times daily.   1 each   12   . folic acid (FOLVITE) 1 MG tablet   Oral   Take by mouth.         . furosemide (LASIX) 20 MG tablet      TAKE ONE (1) TABLET EACH DAY AS NEEDED   30 tablet   3   . levalbuterol (XOPENEX) 1.25 MG/0.5ML nebulizer solution   Nebulization   Take 1.25 mg by nebulization every 4 (four) hours as needed for wheezing or shortness of breath.   1 each   10   . magic mouthwash w/lidocaine SOLN   Oral   Take 5 mLs by mouth 2 (two) times daily as needed for mouth pain (diphenhydramine, alum/mag hydroxide, simethicone, lidocaine).   120 mL   1   . magnesium oxide (MAG-OX) 400 MG tablet   Oral   Take 400 mg by mouth daily.         . methocarbamol (ROBAXIN) 500 MG tablet      TAKE ONE (1) TABLET BY MOUTH THREE TIMESA DAY AS NEEDED FOR MUSCLE SPASMS   60 tablet   3   . methotrexate (RHEUMATREX) 2.5 MG  tablet   Oral   Take 15 mg by mouth once a week. Caution:Chemotherapy. Protect from light.         . methotrexate (RHEUMATREX) 2.5 MG tablet   Oral   Take by mouth.         . Multiple Vitamins-Minerals (PRESERVISION AREDS PO)   Oral   Take by mouth 2 (two) times daily.         [name] neomycin-polymyxin b-dexamethasone (MAXITROL) 3.5-10000-0.1 SUSP      PUT ONE DROP INTO THE LEFT EYE AS NEEDED   5 mL   0   . neomycin-polymyxin-hydrocortisone (CORTISPORIN) otic solution   Left Ear   Place 3 drops into the left ear 3 (three) times daily.   10 mL   1   . nitrofurantoin (MACRODANTIN) 100 MG capsule      TAKE ONE (1) CAPSULE EACH DAY FOR UTI prevention   90 capsule   3   . nystatin (MYCOSTATIN) 100000 UNIT/ML suspension   Oral   Take 5 mLs (500,000 Units total) by mouth 4 (four) times daily. Swish and swallow   120 mL   0   . oxyCODONE-acetaminophen (PERCOCET) 7.5-325 MG tablet      TAKE ONE TABLET EVERY EIGHT (8) HOURS ASNEEDED FOR PAIN   90 tablet   0     Fill on 07/01/2015   . pantoprazole (PROTONIX) 40 MG tablet      TAKE ONE (1) TABLET EACH DAY   90 tablet   3   . Phenyleph-CPM-DM-APAP (ALKA-SELTZER PLUS COLD & COUGH) 11-11-08-325 MG CAPS   Oral   Take 2 tablets by mouth daily.         . polyethylene glycol powder (  GLYCOLAX/MIRALAX) powder      MIX 17GM (AS MARKED IN BOTTLE TOP) IN 8 OUNCES OF WATER, MIX AND DRINK TWICE A DAY AS NEEDED FOR MODERATE CONSTIPATION.   255 g   3   . potassium chloride (K-DUR) 10 MEQ tablet      ONE TABLET EACH DAY AS NEEDED   30 tablet   3   . Pseudoephedrine-APAP-DM (RA ACETAMINOPHEN FLU COLD) 30-500-15 MG TABS   Oral   Take 1 capsule by mouth as needed.         . tiotropium (SPIRIVA HANDIHALER) 18 MCG inhalation capsule   Inhalation   Place 1 capsule (18 mcg total) into inhaler and inhale daily.   90 capsule   3     Current Medication:  Current outpatient prescriptions:  .  albuterol (PROVENTIL  HFA;VENTOLIN HFA) 108 (90 BASE) MCG/ACT inhaler, Inhale 2 puffs into the lungs every 6 (six) hours as needed., Disp: 1 Inhaler, Rfl: 12 .  ALPRAZolam (XANAX) 1 MG tablet, Take 1 tablet (1 mg total) by mouth 2 (two) times daily., Disp: 60 tablet, Rfl: 0 .  aspirin 81 MG tablet, Take 81 mg by mouth daily., Disp: , Rfl:  .  atorvastatin (LIPITOR) 10 MG tablet, Take 1 tablet (10 mg total) by mouth daily., Disp: 90 tablet, Rfl: 3 .  b complex vitamins tablet, Take 1 tablet by mouth daily., Disp: , Rfl:  .  bacitracin-polymyxin b (POLYSPORIN) ophthalmic ointment, Place 1 application into the left eye every 12 (twelve) hours., Disp: 3.5 g, Rfl: 1 .  BIOTIN PO, Take by mouth daily., Disp: , Rfl:  .  calcium-vitamin D (OSCAL) 250-125 MG-UNIT per tablet, Take 2 tablets by mouth daily., Disp: , Rfl:  .  Cyanocobalamin (B-12) 1000 MCG CAPS, Take 1 capsule by mouth daily., Disp: 90 capsule, Rfl: 1 .  dextromethorphan 15 MG/5ML syrup, Take 10 mLs by mouth 4 (four) times daily as needed for cough., Disp: , Rfl:  .  dextromethorphan-guaiFENesin (MUCINEX DM) 30-600 MG 12hr tablet, Take 1 tablet by mouth 2 (two) times daily., Disp: , Rfl:  .  diazepam (VALIUM) 10 MG tablet, Take 1 tablet (10 mg total) by mouth 2 (two) times daily., Disp: 60 tablet, Rfl: 0 .  dicyclomine (BENTYL) 10 MG capsule, Take 10 mg by mouth every 6 (six) hours., Disp: , Rfl:  .  EPINEPHrine 0.3 mg/0.3 mL IJ SOAJ injection, Inject 0.3 mg into the muscle once. For quinolone reaction, Disp: , Rfl:  .  etodolac (LODINE) 400 MG tablet, Take 1 tablet (400 mg total) by mouth 2 (two) times daily., Disp: 180 tablet, Rfl: 3 .  fluconazole (DIFLUCAN) 150 MG tablet, Take 1 tablet (150 mg total) by mouth daily., Disp: 1 tablet, Rfl: 0 .  fluticasone (FLONASE) 50 MCG/ACT nasal spray, Place 2 sprays into both nostrils daily., Disp: 16 g, Rfl: 6 .  Fluticasone-Salmeterol (ADVAIR DISKUS) 250-50 MCG/DOSE AEPB, Inhale 1 puff into the lungs 2 (two) times daily.,  Disp: 1 each, Rfl: 12 .  folic acid (FOLVITE) 1 MG tablet, Take by mouth., Disp: , Rfl:  .  furosemide (LASIX) 20 MG tablet, TAKE ONE (1) TABLET EACH DAY AS NEEDED, Disp: 30 tablet, Rfl: 3 .  levalbuterol (XOPENEX) 1.25 MG/0.5ML nebulizer solution, Take 1.25 mg by nebulization every 4 (four) hours as needed for wheezing or shortness of breath., Disp: 1 each, Rfl: 10 .  magic mouthwash w/lidocaine SOLN, Take 5 mLs by mouth 2 (two) times daily as needed for mouth  pain (diphenhydramine, alum/mag hydroxide, simethicone, lidocaine)., Disp: 120 mL, Rfl: 1 .  magnesium oxide (MAG-OX) 400 MG tablet, Take 400 mg by mouth daily., Disp: , Rfl:  .  methocarbamol (ROBAXIN) 500 MG tablet, TAKE ONE (1) TABLET BY MOUTH THREE TIMESA DAY AS NEEDED FOR MUSCLE SPASMS, Disp: 60 tablet, Rfl: 3 .  methotrexate (RHEUMATREX) 2.5 MG tablet, Take 15 mg by mouth once a week. Caution:Chemotherapy. Protect from light., Disp: , Rfl:  .  methotrexate (RHEUMATREX) 2.5 MG tablet, Take by mouth., Disp: , Rfl:  .  Multiple Vitamins-Minerals (PRESERVISION AREDS PO), Take by mouth 2 (two) times daily., Disp: , Rfl:  .  neomycin-polymyxin b-dexamethasone (MAXITROL) 3.5-10000-0.1 SUSP, PUT ONE DROP INTO THE LEFT EYE AS NEEDED, Disp: 5 mL, Rfl: 0 .  neomycin-polymyxin-hydrocortisone (CORTISPORIN) otic solution, Place 3 drops into the left ear 3 (three) times daily., Disp: 10 mL, Rfl: 1 .  nitrofurantoin (MACRODANTIN) 100 MG capsule, TAKE ONE (1) CAPSULE EACH DAY FOR UTI prevention, Disp: 90 capsule, Rfl: 3 .  nystatin (MYCOSTATIN) 100000 UNIT/ML suspension, Take 5 mLs (500,000 Units total) by mouth 4 (four) times daily. Swish and swallow, Disp: 120 mL, Rfl: 0 .  oxyCODONE-acetaminophen (PERCOCET) 7.5-325 MG tablet, TAKE ONE TABLET EVERY EIGHT (8) HOURS ASNEEDED FOR PAIN, Disp: 90 tablet, Rfl: 0 .  pantoprazole (PROTONIX) 40 MG tablet, TAKE ONE (1) TABLET EACH DAY, Disp: 90 tablet, Rfl: 3 .  Phenyleph-CPM-DM-APAP (ALKA-SELTZER PLUS COLD &  COUGH) 11-11-08-325 MG CAPS, Take 2 tablets by mouth daily., Disp: , Rfl:  .  polyethylene glycol powder (GLYCOLAX/MIRALAX) powder, MIX 17GM (AS MARKED IN BOTTLE TOP) IN 8 OUNCES OF WATER, MIX AND DRINK TWICE A DAY AS NEEDED FOR MODERATE CONSTIPATION., Disp: 255 g, Rfl: 3 .  potassium chloride (K-DUR) 10 MEQ tablet, ONE TABLET EACH DAY AS NEEDED, Disp: 30 tablet, Rfl: 3 .  Pseudoephedrine-APAP-DM (RA ACETAMINOPHEN FLU COLD) 30-500-15 MG TABS, Take 1 capsule by mouth as needed., Disp: , Rfl:  .  tiotropium (SPIRIVA HANDIHALER) 18 MCG inhalation capsule, Place 1 capsule (18 mcg total) into inhaler and inhale daily., Disp: 90 capsule, Rfl: 3    ALLERGIES   Avelox; Hydrocodone; Gabapentin; Methadone hcl; Morphine; Quinolones; and Sulfonamide derivatives     REVIEW OF SYSTEMS   Review of Systems  Constitutional: Positive for chills and malaise/fatigue. Negative for fever.  HENT: Positive for congestion.   Respiratory: Positive for cough, sputum production, shortness of breath and wheezing. Negative for hemoptysis.   Cardiovascular: Negative for chest pain and palpitations.  Musculoskeletal: Negative.   Skin: Negative for rash.  Psychiatric/Behavioral: The patient is nervous/anxious.      VS: BP 128/62 mmHg  Pulse 77  Ht 5\' 5"  (1.651 m)  Wt 184 lb 9.6 oz (83.734 kg)  BMI 30.72 kg/m2  SpO2 97%     PHYSICAL EXAM  Physical Exam  Constitutional: She appears well-developed and well-nourished. No distress.  HENT:  Head: Normocephalic and atraumatic.  Mouth/Throat: No oropharyngeal exudate.  Eyes: EOM are normal. Pupils are equal, round, and reactive to light. No scleral icterus.  Cardiovascular: Normal rate, regular rhythm and normal heart sounds.   No murmur heard. Pulmonary/Chest: No respiratory distress. She has wheezes. She has no rales.  Abdominal: Soft. Bowel sounds are normal.  Skin: Skin is warm. She is not diaphoretic.  Psychiatric: She has a normal mood and affect.        ASSESSMENT/PLAN   55 yo white female with long standing history of tobacco abuse, clinical signs and  symptoms of COPD with Chronic Bronchitis now with acute COPD exacerbation with acute bronchitis  1.will start prednisone therapy for 10 days 2.start oral abx with Biaxin 10 days 3.continue spiriva and advair, levalbuterol as needed 4.follow up in 3 months with PFT's  The Patient requires high complexity decision making for assessment and support, frequent evaluation and titration of therapies, application of advanced monitoring technologies and extensive interpretation of multiple databases.  Patient is satisfied with Plan of action and management.    Lucie Leather, M.D.  [facility]  Medical Director [facility] Medical Director [facility] Cardio-Pulmonary Department

## 2015-07-21 ENCOUNTER — Encounter: Payer: Self-pay | Admitting: Internal Medicine

## 2015-07-22 ENCOUNTER — Other Ambulatory Visit: Payer: Self-pay | Admitting: *Deleted

## 2015-07-22 NOTE — Telephone Encounter (Signed)
Ok to refill 

## 2015-07-22 NOTE — Telephone Encounter (Signed)
PLEASE NOTE: All timestamps contained within this report are represented as [place] Standard Time. CONFIDENTIALTY NOTICE: This fax transmission is intended only for the addressee. It contains information that is legally privileged, confidential or otherwise protected from use or disclosure. If you are not the intended recipient, you are strictly prohibited from reviewing, disclosing, copying using or disseminating any of this information or taking any action in reliance on or regarding this information. If you have received this fax in error, please notify us immediately by telephone so that we can arrange for its return to [place]. Phone: [phone], Toll-Free: [phone], Fax: [phone] Page: 1 of 1 Call Id: [id number] [facility] Day - Client TELEPHONE ADVICE RECORD [facility] Medical Call Center Patient Name: Kaylee [name] Gender: Female DOB: [birth year] Age: 55 Y 8 M 13 D Return Phone Number: [phone] (Primary) Address: City/State/Zip: [place] Client [facility] Day - Client Client Site [facility] - Day Physician [name] Contact Type Call Caller Name [facility] Caller Phone Number [phone] Relationship To Patient Self Is this call to report lab results? No Call Type General Information Initial Comment Caller states she needs a refill on her prescriptions. Valium, Percocet, and Xanax. Message to office. General Information Type Message Only Nurse Assessment Guidelines Guideline Title Affirmed Question Affirmed Notes Nurse Date/Time (Eastern Time) Disp. Time Lamount Cohen Time) Disposition Final User 07/22/2015 10:20:09 AM General Information Provided Yes Marolyn Hammock After Care Instructions Given Call Event Type User Date / Time Description

## 2015-07-22 NOTE — Telephone Encounter (Signed)
Pt left v/m requesting cb if can pick up alprazolam,diazepam and oxycodone apap by 08/01/15. Last seen 05/31/15. All three meds last printed on 06/21/15.

## 2015-07-23 MED ORDER — DIAZEPAM 10 MG PO TABS: 10.0000 mg | ORAL_TABLET | Freq: Two times a day (BID) | ORAL | Status: DC

## 2015-07-23 MED ORDER — ALPRAZOLAM 1 MG PO TABS: 1.0000 mg | ORAL_TABLET | Freq: Two times a day (BID) | ORAL | Status: DC

## 2015-07-23 MED ORDER — OXYCODONE-ACETAMINOPHEN 7.5-325 MG PO TABS: ORAL_TABLET | ORAL | Status: DC

## 2015-07-23 NOTE — Telephone Encounter (Signed)
Printed and in Kim's box 

## 2015-07-23 NOTE — Telephone Encounter (Signed)
Message left advising patient and Rx's placed up front for pick up. 

## 2015-07-30 ENCOUNTER — Telehealth: Payer: Self-pay

## 2015-07-30 DIAGNOSIS — J189 Pneumonia, unspecified organism: Secondary | ICD-10-CM

## 2015-07-30 NOTE — Telephone Encounter (Signed)
Pt called, states she is still "barking up chunks of yellow stuff" . States she is waking up in the middle of the night and cannot breathe. States also her husband wakes her up due to her snoring. Please call.

## 2015-07-30 NOTE — Telephone Encounter (Signed)
Patient still URI symptoms, recently completed course of antibiotics and prednisone taper prescribed by Dr. Belia Heman.    Plan - 2 view CXR and follow up with Dr. Belia Heman in the AM for acute care visit.

## 2015-07-30 NOTE — Telephone Encounter (Signed)
Pt states she is congested, still has a prod cough w/yellow mucus, HA, sore ribs, mouth is raw and says she has thrush. No fever. Pt was seen on 07/17/15 and was given Clarithromycin and Prednisone x 10 days and was told to do the Xopenex BID x 10 days. States she still can't breath well and is snoring at night. States she isn't 100% better and would like to know what else she needs to do. Pt does chemo weekly. Please advise.

## 2015-07-30 NOTE — Telephone Encounter (Signed)
Informed pt of appt and about cxr. Nothing further needed.

## 2015-07-31 ENCOUNTER — Ambulatory Visit
Admission: RE | Admit: 2015-07-31 | Discharge: 2015-07-31 | Disposition: A | Discharge status: Home / Self Care | Payer: Medicare Other | Source: Ambulatory Visit | Attending: Internal Medicine | Admitting: Internal Medicine

## 2015-07-31 ENCOUNTER — Encounter: Payer: Self-pay | Admitting: Internal Medicine

## 2015-07-31 ENCOUNTER — Ambulatory Visit (INDEPENDENT_AMBULATORY_CARE_PROVIDER_SITE_OTHER): Payer: Medicare Other | Admitting: Internal Medicine

## 2015-07-31 VITALS — BP 130/62 | HR 77 | Ht 65.0 in | Wt 186.2 lb

## 2015-07-31 DIAGNOSIS — R0602 Shortness of breath: Secondary | ICD-10-CM | POA: Diagnosis not present

## 2015-07-31 DIAGNOSIS — R0989 Other specified symptoms and signs involving the circulatory and respiratory systems: Secondary | ICD-10-CM | POA: Diagnosis not present

## 2015-07-31 DIAGNOSIS — J189 Pneumonia, unspecified organism: Secondary | ICD-10-CM

## 2015-07-31 DIAGNOSIS — J42 Unspecified chronic bronchitis: Secondary | ICD-10-CM | POA: Diagnosis not present

## 2015-07-31 NOTE — Progress Notes (Signed)
[facility]  Pulmonary Medicine Consultation      Date: 07/31/2015,   MRN# [id number] Kaylee [name] 01-Nov-1960 Code Status:  Hosp day:@LENGTHOFSTAYDAYS @ Referring MD: @ATDPROV @     PCP:      AdmissionWeight: 186 lb 3.2 oz (84.46 kg)                 CurrentWeight: 186 lb 3.2 oz (84.46 kg) Kaylee [name] is a 55 y.o. old female seen in consultation for chronic cough and SOB.  SYNOPSIS: patient seen for symptoms of COPD, previous heavy smoker   CHIEF COMPLAINT:  Follow up for COPD    HISTORY OF PRESENT ILLNESS  Currently on SPiriva and Advair, levalbuterol as needed Has some productive cough, no signs of infection at this time -patient main complaint was that embrel has caused lung damage Patient SOB better today, has occasional DOE   Current Medication:  Current outpatient prescriptions:  .  albuterol (PROVENTIL HFA;VENTOLIN HFA) 108 (90 BASE) MCG/ACT inhaler, Inhale 2 puffs into the lungs every 6 (six) hours as needed., Disp: 1 Inhaler, Rfl: 12 .  ALPRAZolam (XANAX) 1 MG tablet, Take 1 tablet (1 mg total) by mouth 2 (two) times daily., Disp: 60 tablet, Rfl: 0 .  aspirin 81 MG tablet, Take 81 mg by mouth daily., Disp: , Rfl:  .  atorvastatin (LIPITOR) 10 MG tablet, Take 1 tablet (10 mg total) by mouth daily., Disp: 90 tablet, Rfl: 3 .  b complex vitamins tablet, Take 1 tablet by mouth daily., Disp: , Rfl:  .  bacitracin-polymyxin b (POLYSPORIN) ophthalmic ointment, Place 1 application into the left eye every 12 (twelve) hours., Disp: 3.5 g, Rfl: 1 .  BIOTIN PO, Take by mouth daily., Disp: , Rfl:  .  calcium-vitamin D (OSCAL) 250-125 MG-UNIT per tablet, Take 2 tablets by mouth daily., Disp: , Rfl:  .  clarithromycin (BIAXIN) 500 MG tablet, Take 1 tablet (500 mg total) by mouth 2 (two) times daily., Disp: 20 tablet, Rfl: 1 .  Cyanocobalamin (B-12) 1000 MCG CAPS, Take 1 capsule by mouth daily., Disp: 90 capsule, Rfl: 1 .  dextromethorphan 15 MG/5ML syrup, Take 10 mLs by  mouth 4 (four) times daily as needed for cough., Disp: , Rfl:  .  dextromethorphan-guaiFENesin (MUCINEX DM) 30-600 MG 12hr tablet, Take 1 tablet by mouth 2 (two) times daily., Disp: , Rfl:  .  diazepam (VALIUM) 10 MG tablet, Take 1 tablet (10 mg total) by mouth 2 (two) times daily., Disp: 60 tablet, Rfl: 0 .  dicyclomine (BENTYL) 10 MG capsule, Take 10 mg by mouth every 6 (six) hours., Disp: , Rfl:  .  EPINEPHrine 0.3 mg/0.3 mL IJ SOAJ injection, Inject 0.3 mg into the muscle once. For quinolone reaction, Disp: , Rfl:  .  etodolac (LODINE) 400 MG tablet, Take 1 tablet (400 mg total) by mouth 2 (two) times daily., Disp: 180 tablet, Rfl: 3 .  fluconazole (DIFLUCAN) 150 MG tablet, Take 1 tablet (150 mg total) by mouth daily., Disp: 1 tablet, Rfl: 0 .  fluticasone (FLONASE) 50 MCG/ACT nasal spray, Place 2 sprays into both nostrils daily., Disp: 16 g, Rfl: 6 .  Fluticasone-Salmeterol (ADVAIR DISKUS) 250-50 MCG/DOSE AEPB, Inhale 1 puff into the lungs 2 (two) times daily., Disp: 1 each, Rfl: 12 .  folic acid (FOLVITE) 1 MG tablet, Take by mouth., Disp: , Rfl:  .  furosemide (LASIX) 20 MG tablet, TAKE ONE (1) TABLET EACH DAY AS NEEDED, Disp: 30 tablet, Rfl: 3 .  levalbuterol (XOPENEX) 1.25  MG/0.5ML nebulizer solution, Take 1.25 mg by nebulization every 4 (four) hours as needed for wheezing or shortness of breath., Disp: 1 each, Rfl: 20 .  magic mouthwash w/lidocaine SOLN, Take 5 mLs by mouth 2 (two) times daily as needed for mouth pain (diphenhydramine, alum/mag hydroxide, simethicone, lidocaine)., Disp: 120 mL, Rfl: 1 .  magnesium oxide (MAG-OX) 400 MG tablet, Take 400 mg by mouth daily., Disp: , Rfl:  .  methocarbamol (ROBAXIN) 500 MG tablet, TAKE ONE (1) TABLET BY MOUTH THREE TIMESA DAY AS NEEDED FOR MUSCLE SPASMS, Disp: 60 tablet, Rfl: 3 .  methotrexate (RHEUMATREX) 2.5 MG tablet, Take by mouth., Disp: , Rfl:  .  Multiple Vitamins-Minerals (PRESERVISION AREDS PO), Take by mouth 2 (two) times daily.,  Disp: , Rfl:  .  neomycin-polymyxin b-dexamethasone (MAXITROL) 3.5-10000-0.1 SUSP, PUT ONE DROP INTO THE LEFT EYE AS NEEDED, Disp: 5 mL, Rfl: 0 .  neomycin-polymyxin-hydrocortisone (CORTISPORIN) otic solution, Place 3 drops into the left ear 3 (three) times daily., Disp: 10 mL, Rfl: 1 .  nitrofurantoin (MACRODANTIN) 100 MG capsule, TAKE ONE (1) CAPSULE EACH DAY FOR UTI prevention, Disp: 90 capsule, Rfl: 3 .  nystatin (MYCOSTATIN) 100000 UNIT/ML suspension, Take 5 mLs (500,000 Units total) by mouth 4 (four) times daily. Swish and swallow, Disp: 120 mL, Rfl: 0 .  oxyCODONE-acetaminophen (PERCOCET) 7.5-325 MG tablet, TAKE ONE TABLET EVERY EIGHT (8) HOURS ASNEEDED FOR PAIN, Disp: 90 tablet, Rfl: 0 .  pantoprazole (PROTONIX) 40 MG tablet, TAKE ONE (1) TABLET EACH DAY, Disp: 90 tablet, Rfl: 3 .  Phenyleph-CPM-DM-APAP (ALKA-SELTZER PLUS COLD & COUGH) 11-11-08-325 MG CAPS, Take 2 tablets by mouth daily., Disp: , Rfl:  .  polyethylene glycol powder (GLYCOLAX/MIRALAX) powder, MIX 17GM (AS MARKED IN BOTTLE TOP) IN 8 OUNCES OF WATER, MIX AND DRINK TWICE A DAY AS NEEDED FOR MODERATE CONSTIPATION., Disp: 255 g, Rfl: 3 .  potassium chloride (K-DUR) 10 MEQ tablet, ONE TABLET EACH DAY AS NEEDED, Disp: 30 tablet, Rfl: 3 .  tiotropium (SPIRIVA HANDIHALER) 18 MCG inhalation capsule, Place 1 capsule (18 mcg total) into inhaler and inhale daily., Disp: 90 capsule, Rfl: 3    ALLERGIES   Avelox; Hydrocodone; Gabapentin; Methadone hcl; Morphine; Quinolones; and Sulfonamide derivatives     REVIEW OF SYSTEMS   Review of Systems  Constitutional: Positive for malaise/fatigue. Negative for fever and chills.  HENT: Negative for congestion.   Respiratory: Positive for cough and sputum production. Negative for hemoptysis, shortness of breath and wheezing.   Cardiovascular: Negative for chest pain and palpitations.  Musculoskeletal: Negative.   Psychiatric/Behavioral: The patient is nervous/anxious.   All other systems  reviewed and are negative.    VS: BP 130/62 mmHg  Pulse 77  Ht 5\' 5"  (1.651 m)  Wt 186 lb 3.2 oz (84.46 kg)  BMI 30.99 kg/m2  SpO2 100%     PHYSICAL EXAM  Physical Exam  Constitutional: She appears well-developed and well-nourished. No distress.  HENT:  Head: Normocephalic and atraumatic.  Mouth/Throat: No oropharyngeal exudate.  Eyes: No scleral icterus.  Cardiovascular: Normal rate, regular rhythm and normal heart sounds.   No murmur heard. Pulmonary/Chest: No respiratory distress. She has no wheezes. She has no rales.  Abdominal: Soft. Bowel sounds are normal.  Skin: She is not diaphoretic.  Psychiatric: She has a normal mood and affect.     CXR 07/31/15 images reviwed 07/31/2015   ASSESSMENT/PLAN   55 yo white female with long standing history of tobacco abuse, clinical signs and symptoms of COPD with Chronic  Bronchitis Patient with RA on MTX and had been on Embrel Will need further testing for her Chronic SOB    1.no need for prednisone therapy at this time 2.no abx at this time 3.continue spiriva and advair, levalbuterol as needed 4.check CT chest with contrast 5.PFT with 6.check overnight Pulse ox  follow up in 1 month after tests completed.  The Patient requires high complexity decision making for assessment and support, frequent evaluation and titration of therapies, application of advanced monitoring technologies and extensive interpretation of multiple databases.  Patient is satisfied with Plan of action and management.    Lucie Leather, M.D.  [facility]  Medical Director [facility] Medical Director [facility] Cardio-Pulmonary Department

## 2015-07-31 NOTE — Patient Instructions (Signed)

## 2015-08-08 DIAGNOSIS — R0602 Shortness of breath: Secondary | ICD-10-CM | POA: Diagnosis not present

## 2015-08-13 ENCOUNTER — Telehealth: Payer: Self-pay | Admitting: *Deleted

## 2015-08-13 NOTE — Telephone Encounter (Signed)
Needs to be seen in clinic Has she had her CT chest, PFT?

## 2015-08-13 NOTE — Telephone Encounter (Signed)
Please call patient as she is off all her meds. She feels terrible with a sore raw mouth and chest congestion. Terrible cough and can't breathe. Please call patient.

## 2015-08-13 NOTE — Telephone Encounter (Signed)
Pt states her mouth is raw. Pt states she is having chest congestion, cough, prod at times, SOB comes and goes. felt good last week but feeling like she is getting sick again. Only using neb treatments BID and the 2 inhalers. Has acid reflux also. Please advise.

## 2015-08-14 ENCOUNTER — Telehealth: Payer: Self-pay

## 2015-08-14 ENCOUNTER — Encounter: Payer: Self-pay | Admitting: Pulmonary Disease

## 2015-08-14 ENCOUNTER — Ambulatory Visit (INDEPENDENT_AMBULATORY_CARE_PROVIDER_SITE_OTHER): Payer: Medicare Other | Admitting: Pulmonary Disease

## 2015-08-14 VITALS — BP 132/80 | HR 96 | Ht 65.0 in | Wt 191.2 lb

## 2015-08-14 DIAGNOSIS — K1379 Other lesions of oral mucosa: Secondary | ICD-10-CM

## 2015-08-14 DIAGNOSIS — B370 Candidal stomatitis: Secondary | ICD-10-CM

## 2015-08-14 DIAGNOSIS — R05 Cough: Secondary | ICD-10-CM | POA: Diagnosis not present

## 2015-08-14 DIAGNOSIS — J441 Chronic obstructive pulmonary disease with (acute) exacerbation: Secondary | ICD-10-CM

## 2015-08-14 DIAGNOSIS — R059 Cough, unspecified: Secondary | ICD-10-CM

## 2015-08-14 MED ORDER — FLUTICASONE-SALMETEROL 100-50 MCG/DOSE IN AEPB: 1.0000 | INHALATION_SPRAY | Freq: Two times a day (BID) | RESPIRATORY_TRACT | Status: DC

## 2015-08-14 MED ORDER — DOXYCYCLINE HYCLATE 100 MG PO TABS: 100.0000 mg | ORAL_TABLET | Freq: Two times a day (BID) | ORAL | Status: DC

## 2015-08-14 MED ORDER — FLUCONAZOLE 100 MG PO TABS: 100.0000 mg | ORAL_TABLET | Freq: Every day | ORAL | Status: DC

## 2015-08-14 NOTE — Telephone Encounter (Signed)
Pt. Informed of ONO results Nothing further needed.

## 2015-08-14 NOTE — Patient Instructions (Signed)
Doxycycline 100 mg - twice a day for 7 days Fluconazole 100 mg - once daily for 7 days Continue Magic Mouthwash as needed for mouth pain

## 2015-08-14 NOTE — Telephone Encounter (Signed)
Spoke with pt and informed she would have to be seen. Pt's CT scan and PFTs are scheduled for 2/15. Will advise DS this afternoon. Nothing further needed.

## 2015-08-15 NOTE — Progress Notes (Signed)
ACUTE PULMONARY OFFICE VISIT  ACUTE PROBLEM:  Mouth pain Hoarseness Persistent SOB, cough, sputum production  CHRONIC PULMONARY PROBLEMS: COPD, chronic bronchitis  SUBJ: Last seen by Dr Belia Heman 07/31/15. She indicates that she was treated with clarithromycin and prednisone for acute AB. She Initially improved but now has worsening symptoms with persistent cough, hoarseness. Her biggest complaint, however, is severe mouth and throat pain. She had some MMW from a prior episode oral candidiasis and started taking that a couple of days. She has refills of this medication available to her. She was started on ranitidine for GERD symptoms last week. She notes that she has run out of Advair and her pharmacy will not provide more for 2 more weeks because it is too early. She denies fever, CP, LE edema, hemoptysis.   OBJ: Filed Vitals:   08/14/15 1259  BP: 132/80  Pulse: 96  Height: 5\' 5"  (1.651 m)  Weight: 191 lb 3.2 oz (86.728 kg)  SpO2: 98%   NAD, hoarse voice quality HEENT: NCAT, CNs grossly intact, nasal mucosa normal, oral mucosa normal without lesions or exudates No JVD Breath sounds slightly coarse without wheezes RRR s M NABS, soft No C/C/E   IMPRESSION: Mouth sores  Oral candidiasis  Cough  COPD exacerbation (HCC)    PLAN:   Doxycycline 100 mg - twice a day for 7 days Fluconazole 100 mg - once daily for 7 days Continue Magic Mouthwash as needed for mouth pain She has PFTs ordered for 2/15 and ROV with Dr 3/15 02/17. She is to call sooner if her symptoms do not improve before then  I have reviewed this patient's medical problems, current medications and therapies and prior pulmonary office notes in evaluation and formulation of the above assessment and plan    3/17, MD [facility]/CCM

## 2015-08-26 ENCOUNTER — Other Ambulatory Visit: Payer: Self-pay

## 2015-08-26 MED ORDER — ALPRAZOLAM 1 MG PO TABS: 1.0000 mg | ORAL_TABLET | Freq: Two times a day (BID) | ORAL | Status: DC

## 2015-08-26 MED ORDER — DIAZEPAM 10 MG PO TABS: 10.0000 mg | ORAL_TABLET | Freq: Two times a day (BID) | ORAL | Status: DC

## 2015-08-26 MED ORDER — OXYCODONE-ACETAMINOPHEN 7.5-325 MG PO TABS: ORAL_TABLET | ORAL | Status: DC

## 2015-08-26 NOTE — Telephone Encounter (Signed)
Pt left v/m requesting refill alprazolam(last refilled # 60 on 07/23/15) and diazepam(last refilled # 60 on 07/23/15) to [facility]; also rx for oxycodone apap(last printed # 90 on 07/23/15) pt will be in jury duty today. Last seen 05/31/15.

## 2015-08-26 NOTE — Telephone Encounter (Signed)
Printed and in Kim's box. plz phone in valium and xanax.

## 2015-08-27 NOTE — Telephone Encounter (Signed)
Message left advising patient and Rx placed up front for pick up. Rx's also called into pharmacy as directed.

## 2015-08-28 ENCOUNTER — Ambulatory Visit (INDEPENDENT_AMBULATORY_CARE_PROVIDER_SITE_OTHER): Payer: Medicare Other | Admitting: *Deleted

## 2015-08-28 ENCOUNTER — Ambulatory Visit
Admission: RE | Admit: 2015-08-28 | Discharge: 2015-08-28 | Disposition: A | Discharge status: Home / Self Care | Payer: Medicare Other | Source: Ambulatory Visit | Attending: Internal Medicine | Admitting: Internal Medicine

## 2015-08-28 DIAGNOSIS — R0602 Shortness of breath: Secondary | ICD-10-CM

## 2015-08-28 DIAGNOSIS — K7689 Other specified diseases of liver: Secondary | ICD-10-CM | POA: Insufficient documentation

## 2015-08-28 DIAGNOSIS — J42 Unspecified chronic bronchitis: Secondary | ICD-10-CM

## 2015-08-28 HISTORY — DX: Systemic involvement of connective tissue, unspecified: M35.9

## 2015-08-28 LAB — PULMONARY FUNCTION TEST
DL/VA % pred: 74 %
DL/VA: 3.77 ml/min/mmHg/L
DLCO UNC % PRED: 84 %
DLCO unc: 22.66 ml/min/mmHg
FEF 25-75 PRE: 3.8 L/s
FEF 25-75 Post: 2.8 L/sec
FEF2575-%CHANGE-POST: -26 %
FEF2575-%Pred-Post: 106 %
FEF2575-%Pred-Pre: 143 %
FEV1-%CHANGE-POST: -8 %
FEV1-%PRED-POST: 105 %
FEV1-%Pred-Pre: 116 %
FEV1-POST: 3.03 L
FEV1-Pre: 3.33 L
FEV1FVC-%Change-Post: -1 %
FEV1FVC-%Pred-Pre: 105 %
FEV6-%Change-Post: -6 %
FEV6-%PRED-POST: 103 %
FEV6-%PRED-PRE: 111 %
FEV6-POST: 3.68 L
FEV6-PRE: 3.96 L
FEV6FVC-%PRED-POST: 103 %
FEV6FVC-%PRED-PRE: 103 %
FVC-%CHANGE-POST: -7 %
FVC-%Pred-Post: 100 %
FVC-%Pred-Pre: 108 %
FVC-Post: 3.68 L
FVC-Pre: 3.99 L
PRE FEV6/FVC RATIO: 100 %
Post FEV1/FVC ratio: 82 %
Post FEV6/FVC ratio: 100 %
Pre FEV1/FVC ratio: 83 %
RV % PRED: 96 %
RV: 1.92 L
TLC % pred: 113 %
TLC: 6.1 L

## 2015-08-28 LAB — POCT I-STAT CREATININE: Creatinine, Ser: 0.9 mg/dL (ref 0.44–1.00)

## 2015-08-28 MED ORDER — IOHEXOL 300 MG/ML  SOLN
75.0000 mL | Freq: Once | INTRAMUSCULAR | Status: AC | PRN
  Administered 2015-08-28: 75 mL via INTRAVENOUS

## 2015-08-28 NOTE — Progress Notes (Signed)
SMW performed today. 

## 2015-08-28 NOTE — Progress Notes (Signed)
PFT performed today. 

## 2015-08-30 ENCOUNTER — Telehealth: Payer: Self-pay

## 2015-08-30 ENCOUNTER — Ambulatory Visit (INDEPENDENT_AMBULATORY_CARE_PROVIDER_SITE_OTHER): Payer: Medicare Other | Admitting: Internal Medicine

## 2015-08-30 ENCOUNTER — Encounter: Payer: Self-pay | Admitting: Internal Medicine

## 2015-08-30 VITALS — BP 138/82 | HR 59 | Ht 65.0 in | Wt 189.2 lb

## 2015-08-30 DIAGNOSIS — J309 Allergic rhinitis, unspecified: Secondary | ICD-10-CM | POA: Diagnosis not present

## 2015-08-30 DIAGNOSIS — J329 Chronic sinusitis, unspecified: Secondary | ICD-10-CM | POA: Diagnosis not present

## 2015-08-30 DIAGNOSIS — K219 Gastro-esophageal reflux disease without esophagitis: Secondary | ICD-10-CM

## 2015-08-30 MED ORDER — PANTOPRAZOLE SODIUM 40 MG PO TBEC: 40.0000 mg | DELAYED_RELEASE_TABLET | Freq: Every day | ORAL | Status: DC

## 2015-08-30 MED ORDER — CETIRIZINE HCL 10 MG PO CAPS: 10.0000 mg | ORAL_CAPSULE | Freq: Every day | ORAL | Status: DC

## 2015-08-30 NOTE — Telephone Encounter (Addendum)
Kaylee [name] dropped off a disability Placard to be filled out by doctor, she stated that the DMV marked permanent placard and told her to get the doctor to sign that and she would be able to get it for five years instead of every 6 months. Call her when ready to pick up. Placed in RX box up front

## 2015-08-30 NOTE — Telephone Encounter (Signed)
Filled and in Kim's box. 

## 2015-08-30 NOTE — Progress Notes (Signed)
[facility] Pulmonary Medicine Consultation      Date: 08/30/2015,   MRN# [id number] Kaylee [name] 05-13-1961 Code Status:  Hosp day:@LENGTHOFSTAYDAYS @ Referring MD: @ATDPROV @     PCP:      AdmissionWeight: 189 lb 3.2 oz (85.821 kg)                 CurrentWeight: 189 lb 3.2 oz (85.821 kg) Kaylee [name] is a 55 y.o. old female seen in consultation for chronic cough and SOB.  SYNOPSIS: patient seen for symptoms of COPD, previous heavy smoker   CHIEF COMPLAINT:  Follow up for bronchitis    HISTORY OF PRESENT ILLNESS  Patient has no new complaints today, feels well, no SOB, no fevers , just a hoarse voice.  After further assessment, patient has NL PFT and 53 and normal ONO She has been having recurrent sinus infections which then cause her to have acute bronchitis episodes She has constant nasal congestion, she states that she has had ENT surgery in the past  no signs of infection at this time   Current Medication:  Current outpatient prescriptions:  .  albuterol (PROVENTIL HFA;VENTOLIN HFA) 108 (90 BASE) MCG/ACT inhaler, Inhale 2 puffs into the lungs every 6 (six) hours as needed., Disp: 1 Inhaler, Rfl: 12 .  ALPRAZolam (XANAX) 1 MG tablet, Take 1 tablet (1 mg total) by mouth 2 (two) times daily., Disp: 60 tablet, Rfl: 0 .  aspirin 81 MG tablet, Take 81 mg by mouth daily., Disp: , Rfl:  .  atorvastatin (LIPITOR) 10 MG tablet, Take 1 tablet (10 mg total) by mouth daily., Disp: 90 tablet, Rfl: 3 .  b complex vitamins tablet, Take 1 tablet by mouth daily., Disp: , Rfl:  .  bacitracin-polymyxin b (POLYSPORIN) ophthalmic ointment, Place 1 application into the left eye every 12 (twelve) hours., Disp: 3.5 g, Rfl: 1 .  BIOTIN PO, Take by mouth daily., Disp: , Rfl:  .  calcium-vitamin D (OSCAL) 250-125 MG-UNIT per tablet, Take 2 tablets by mouth daily., Disp: , Rfl:  .  clarithromycin (BIAXIN) 500 MG tablet, Take 1 tablet (500 mg total) by mouth 2 (two) times daily., Disp:  20 tablet, Rfl: 1 .  Cyanocobalamin (B-12) 1000 MCG CAPS, Take 1 capsule by mouth daily., Disp: 90 capsule, Rfl: 1 .  dextromethorphan 15 MG/5ML syrup, Take 10 mLs by mouth 4 (four) times daily as needed for cough., Disp: , Rfl:  .  dextromethorphan-guaiFENesin (MUCINEX DM) 30-600 MG 12hr tablet, Take 1 tablet by mouth 2 (two) times daily., Disp: , Rfl:  .  diazepam (VALIUM) 10 MG tablet, Take 1 tablet (10 mg total) by mouth 2 (two) times daily., Disp: 60 tablet, Rfl: 0 .  dicyclomine (BENTYL) 10 MG capsule, Take 10 mg by mouth every 6 (six) hours., Disp: , Rfl:  .  doxycycline (VIBRA-TABS) 100 MG tablet, Take 1 tablet (100 mg total) by mouth 2 (two) times daily., Disp: 14 tablet, Rfl: 0 .  EPINEPHrine 0.3 mg/0.3 mL IJ SOAJ injection, Inject 0.3 mg into the muscle once. For quinolone reaction, Disp: , Rfl:  .  etodolac (LODINE) 400 MG tablet, Take 1 tablet (400 mg total) by mouth 2 (two) times daily., Disp: 180 tablet, Rfl: 3 .  fluconazole (DIFLUCAN) 100 MG tablet, Take 1 tablet (100 mg total) by mouth daily., Disp: 7 tablet, Rfl: 0 .  fluticasone (FLONASE) 50 MCG/ACT nasal spray, Place 2 sprays into both nostrils daily., Disp: 16 g, Rfl: 6 .  Fluticasone-Salmeterol (ADVAIR DISKUS)  100-50 MCG/DOSE AEPB, Inhale 1 puff into the lungs 2 (two) times daily., Disp: 14 each, Rfl: 0 .  Fluticasone-Salmeterol (ADVAIR DISKUS) 250-50 MCG/DOSE AEPB, Inhale 1 puff into the lungs 2 (two) times daily., Disp: 1 each, Rfl: 12 .  folic acid (FOLVITE) 1 MG tablet, Take by mouth., Disp: , Rfl:  .  furosemide (LASIX) 20 MG tablet, TAKE ONE (1) TABLET EACH DAY AS NEEDED, Disp: 30 tablet, Rfl: 3 .  levalbuterol (XOPENEX) 1.25 MG/0.5ML nebulizer solution, Take 1.25 mg by nebulization every 4 (four) hours as needed for wheezing or shortness of breath., Disp: 1 each, Rfl: 20 .  lidocaine (XYLOCAINE) 2 % solution, , Disp: , Rfl:  .  magic mouthwash w/lidocaine SOLN, Take 5 mLs by mouth 2 (two) times daily as needed for mouth  pain (diphenhydramine, alum/mag hydroxide, simethicone, lidocaine)., Disp: 120 mL, Rfl: 1 .  magnesium oxide (MAG-OX) 400 MG tablet, Take 400 mg by mouth daily., Disp: , Rfl:  .  methocarbamol (ROBAXIN) 500 MG tablet, TAKE ONE (1) TABLET BY MOUTH THREE TIMESA DAY AS NEEDED FOR MUSCLE SPASMS, Disp: 60 tablet, Rfl: 3 .  methotrexate (RHEUMATREX) 2.5 MG tablet, Take by mouth., Disp: , Rfl:  .  Multiple Vitamins-Minerals (PRESERVISION AREDS PO), Take by mouth 2 (two) times daily., Disp: , Rfl:  .  neomycin-polymyxin b-dexamethasone (MAXITROL) 3.5-10000-0.1 SUSP, PUT ONE DROP INTO THE LEFT EYE AS NEEDED, Disp: 5 mL, Rfl: 0 .  neomycin-polymyxin-hydrocortisone (CORTISPORIN) otic solution, Place 3 drops into the left ear 3 (three) times daily., Disp: 10 mL, Rfl: 1 .  nitrofurantoin (MACRODANTIN) 100 MG capsule, TAKE ONE (1) CAPSULE EACH DAY FOR UTI prevention, Disp: 90 capsule, Rfl: 3 .  nystatin (MYCOSTATIN) 100000 UNIT/ML suspension, Take 5 mLs (500,000 Units total) by mouth 4 (four) times daily. Swish and swallow, Disp: 120 mL, Rfl: 0 .  oxyCODONE-acetaminophen (PERCOCET) 7.5-325 MG tablet, TAKE ONE TABLET EVERY EIGHT (8) HOURS ASNEEDED FOR PAIN, Disp: 90 tablet, Rfl: 0 .  pantoprazole (PROTONIX) 40 MG tablet, TAKE ONE (1) TABLET EACH DAY, Disp: 90 tablet, Rfl: 3 .  Phenyleph-CPM-DM-APAP (ALKA-SELTZER PLUS COLD & COUGH) 11-11-08-325 MG CAPS, Take 2 tablets by mouth daily., Disp: , Rfl:  .  polyethylene glycol powder (GLYCOLAX/MIRALAX) powder, MIX 17GM (AS MARKED IN BOTTLE TOP) IN 8 OUNCES OF WATER, MIX AND DRINK TWICE A DAY AS NEEDED FOR MODERATE CONSTIPATION., Disp: 255 g, Rfl: 3 .  potassium chloride (K-DUR) 10 MEQ tablet, ONE TABLET EACH DAY AS NEEDED, Disp: 30 tablet, Rfl: 3 .  tiotropium (SPIRIVA HANDIHALER) 18 MCG inhalation capsule, Place 1 capsule (18 mcg total) into inhaler and inhale daily., Disp: 90 capsule, Rfl: 3    ALLERGIES   Avelox; Hydrocodone; Gabapentin; Methadone hcl; Morphine;  Quinolones; and Sulfonamide derivatives     REVIEW OF SYSTEMS   Review of Systems  Constitutional: Negative for fever, chills and malaise/fatigue.  HENT: Positive for congestion.   Respiratory: Negative for cough, hemoptysis, sputum production, shortness of breath and wheezing.   Cardiovascular: Negative for chest pain and palpitations.  Musculoskeletal: Negative.   Psychiatric/Behavioral: The patient is nervous/anxious.   All other systems reviewed and are negative.    VS: BP 138/82 mmHg  Pulse 59  Ht 5\' 5"  (1.651 m)  Wt 189 lb 3.2 oz (85.821 kg)  BMI 31.48 kg/m2  SpO2 94%     PHYSICAL EXAM  Physical Exam  Constitutional: She appears well-developed and well-nourished. No distress.  HENT:  Head: Normocephalic and atraumatic.  Mouth/Throat: No oropharyngeal  exudate.  Eyes: No scleral icterus.  Cardiovascular: Normal rate, regular rhythm and normal heart sounds.   No murmur heard. Pulmonary/Chest: No respiratory distress. She has no wheezes. She has no rales.  Abdominal: Soft. Bowel sounds are normal.  Skin: She is not diaphoretic.  Psychiatric: She has a normal mood and affect.   Ct chest 08/28/15 shows no acute process, possible early ILD-images reviewed 08/30/2015  ima  ima Images reve  ASSESSMENT/PLAN   55 yo white female with long standing history of tobacco abuse, clinical signs and symptoms of chronic sinus infections leading to acute bouts of recurrent bronchitis.   1.no need for prednisone therapy at this time 2.no abx at this time 3.stop all BD therapy at this time. 4.ENT consult to assess sinus disease 5.start zyrtec and start PPI.  follow up in 3 months   The Patient requires high complexity decision making for assessment and support, frequent evaluation and titration of therapies, application of advanced monitoring technologies and extensive interpretation of multiple databases.  Patient is satisfied with Plan of action and  management.    Lucie Leather, M.D.  [facility]  Medical Director [facility] Medical Director [facility] Cardio-Pulmonary Department

## 2015-08-30 NOTE — Patient Instructions (Signed)
Allergic Rhinitis Allergic rhinitis is when the mucous membranes in the nose respond to allergens. Allergens are particles in the air that cause your body to have an allergic reaction. This causes you to release allergic antibodies. Through a chain of events, these eventually cause you to release histamine into the blood stream. Although meant to protect the body, it is this release of histamine that causes your discomfort, such as frequent sneezing, congestion, and an itchy, runny nose.  CAUSES Seasonal allergic rhinitis (hay fever) is caused by pollen allergens that may come from grasses, trees, and weeds. Year-round allergic rhinitis (perennial allergic rhinitis) is caused by allergens such as house dust mites, pet dander, and mold spores. SYMPTOMS  Nasal stuffiness (congestion).  Itchy, runny nose with sneezing and tearing of the eyes. DIAGNOSIS Your health care provider can help you determine the allergen or allergens that trigger your symptoms. If you and your health care provider are unable to determine the allergen, skin or blood testing may be used. Your health care provider will diagnose your condition after taking your health history and performing a physical exam. Your health care provider may assess you for other related conditions, such as asthma, pink eye, or an ear infection. TREATMENT Allergic rhinitis does not have a cure, but it can be controlled by:  Medicines that block allergy symptoms. These may include allergy shots, nasal sprays, and oral antihistamines.  Avoiding the allergen. Hay fever may often be treated with antihistamines in pill or nasal spray forms. Antihistamines block the effects of histamine. There are over-the-counter medicines that may help with nasal congestion and swelling around the eyes. Check with your health care provider before taking or giving this medicine. If avoiding the allergen or the medicine prescribed do not work, there are many new medicines  your health care provider can prescribe. Stronger medicine may be used if initial measures are ineffective. Desensitizing injections can be used if medicine and avoidance does not work. Desensitization is when a patient is given ongoing shots until the body becomes less sensitive to the allergen. Make sure you follow up with your health care provider if problems continue. HOME CARE INSTRUCTIONS It is not possible to completely avoid allergens, but you can reduce your symptoms by taking steps to limit your exposure to them. It helps to know exactly what you are allergic to so that you can avoid your specific triggers. SEEK MEDICAL CARE IF:  You have a fever.  You develop a cough that does not stop easily (persistent).  You have shortness of breath.  You start wheezing.  Symptoms interfere with normal daily activities.   This information is not intended to replace advice given to you by your health care provider. Make sure you discuss any questions you have with your health care provider.   Document Released: 03/24/2001 Document Revised: 07/20/2014 Document Reviewed: 03/06/2013 Elsevier Interactive Patient Education 2016 Elsevier Inc.  

## 2015-08-30 NOTE — Telephone Encounter (Signed)
In your IN box for review.  

## 2015-08-31 ENCOUNTER — Encounter: Payer: Self-pay | Admitting: Family Medicine

## 2015-09-02 NOTE — Telephone Encounter (Signed)
Patient notified that form is ready for pick up.

## 2015-09-11 DIAGNOSIS — M0579 Rheumatoid arthritis with rheumatoid factor of multiple sites without organ or systems involvement: Secondary | ICD-10-CM | POA: Diagnosis not present

## 2015-09-11 DIAGNOSIS — Z79899 Other long term (current) drug therapy: Secondary | ICD-10-CM | POA: Diagnosis not present

## 2015-09-23 ENCOUNTER — Ambulatory Visit: Payer: Medicare Other | Admitting: Family Medicine

## 2015-09-23 ENCOUNTER — Encounter: Payer: Self-pay | Admitting: Family Medicine

## 2015-09-23 ENCOUNTER — Ambulatory Visit (INDEPENDENT_AMBULATORY_CARE_PROVIDER_SITE_OTHER): Payer: Medicare Other | Admitting: Family Medicine

## 2015-09-23 VITALS — BP 128/70 | HR 76 | Temp 98.0°F | Wt 187.2 lb

## 2015-09-23 DIAGNOSIS — G894 Chronic pain syndrome: Secondary | ICD-10-CM | POA: Diagnosis not present

## 2015-09-23 DIAGNOSIS — F411 Generalized anxiety disorder: Secondary | ICD-10-CM

## 2015-09-23 DIAGNOSIS — Z79891 Long term (current) use of opiate analgesic: Secondary | ICD-10-CM | POA: Diagnosis not present

## 2015-09-23 DIAGNOSIS — M0600 Rheumatoid arthritis without rheumatoid factor, unspecified site: Secondary | ICD-10-CM

## 2015-09-23 DIAGNOSIS — K219 Gastro-esophageal reflux disease without esophagitis: Secondary | ICD-10-CM | POA: Diagnosis not present

## 2015-09-23 DIAGNOSIS — M1990 Unspecified osteoarthritis, unspecified site: Secondary | ICD-10-CM | POA: Diagnosis not present

## 2015-09-23 DIAGNOSIS — R892 Abnormal level of other drugs, medicaments and biological substances in specimens from other organs, systems and tissues: Secondary | ICD-10-CM

## 2015-09-23 DIAGNOSIS — Z79899 Other long term (current) drug therapy: Secondary | ICD-10-CM | POA: Diagnosis not present

## 2015-09-23 MED ORDER — METHOCARBAMOL 500 MG PO TABS: ORAL_TABLET | ORAL | Status: DC

## 2015-09-23 MED ORDER — ALPRAZOLAM 1 MG PO TABS: 1.0000 mg | ORAL_TABLET | Freq: Two times a day (BID) | ORAL | Status: DC

## 2015-09-23 MED ORDER — POTASSIUM CHLORIDE ER 10 MEQ PO TBCR: EXTENDED_RELEASE_TABLET | ORAL | Status: DC

## 2015-09-23 MED ORDER — FUROSEMIDE 20 MG PO TABS: ORAL_TABLET | ORAL | Status: DC

## 2015-09-23 MED ORDER — DIAZEPAM 10 MG PO TABS: 10.0000 mg | ORAL_TABLET | Freq: Two times a day (BID) | ORAL | Status: DC

## 2015-09-23 MED ORDER — OXYCODONE-ACETAMINOPHEN 7.5-325 MG PO TABS: ORAL_TABLET | ORAL | Status: DC

## 2015-09-23 MED ORDER — PANTOPRAZOLE SODIUM 40 MG PO TBEC: 40.0000 mg | DELAYED_RELEASE_TABLET | Freq: Every day | ORAL | Status: DC

## 2015-09-23 NOTE — Progress Notes (Signed)
Pre visit review using our clinic review tool, if applicable. No additional management support is needed unless otherwise documented below in the visit note. 

## 2015-09-23 NOTE — Assessment & Plan Note (Signed)
Refilled oxycodone. Update UDS today.

## 2015-09-23 NOTE — Patient Instructions (Addendum)
Update UDS today. Return in 6 months for physical and fasting labs prior.  I will work on letters for you - and will call you to pick up when ready.

## 2015-09-23 NOTE — Assessment & Plan Note (Signed)
Advised we need to update UDS today - she states she already provided sample prior to visit today - will send off for UDS.

## 2015-09-23 NOTE — Assessment & Plan Note (Signed)
Appreciate rheum care. On prednisone 5mg  daily + plaquenil + MTX

## 2015-09-23 NOTE — Assessment & Plan Note (Signed)
Continue valium/xanax. Update UDS today. 

## 2015-09-23 NOTE — Progress Notes (Signed)
BP 128/70 mmHg  Pulse 76  Temp(Src) 98 F (36.7 C) (Oral)  Wt 187 lb 4 oz (84.936 kg)   CC: 4 mo f/u visit  Subjective:    Patient ID: Kaylee [name], female    DOB: 1960-10-08, 55 y.o.   MRN: [id number]  HPI: Kaylee [name] is a 55 y.o. female presenting on 09/23/2015 for Follow-up   Saw pulm Dr Belia Heman - referred to ENT Dr Andee Poles. Requests letter with her med list and problem list stating she is on disability and list of doctors in health care - 1 letter to [employer] and another to [employer].   Requests controlled substance refill. Due for UDS today. meds extensively reviewed with patient.  She endorses she is on lovaza not welchol anymore.   Relevant past medical, surgical, family and social history reviewed and updated as indicated. Interim medical history since our last visit reviewed. Allergies and medications reviewed and updated. Current Outpatient Prescriptions on File Prior to Visit  Medication Sig  . albuterol (PROVENTIL HFA;VENTOLIN HFA) 108 (90 BASE) MCG/ACT inhaler Inhale 2 puffs into the lungs every 6 (six) hours as needed.  [name] aspirin 81 MG tablet Take 81 mg by mouth daily.  [name] atorvastatin (LIPITOR) 10 MG tablet Take 1 tablet (10 mg total) by mouth daily.  [name] b complex vitamins tablet Take 1 tablet by mouth daily.  [name] BIOTIN PO Take by mouth daily.  . calcium-vitamin D (OSCAL) 250-125 MG-UNIT per tablet Take 2 tablets by mouth daily.  . Cetirizine HCl 10 MG CAPS Take 1 capsule (10 mg total) by mouth at bedtime.  . Cyanocobalamin (B-12) 1000 MCG CAPS Take 1 capsule by mouth daily.  [name] dicyclomine (BENTYL) 10 MG capsule Take 10 mg by mouth every 6 (six) hours.  [name] EPINEPHrine 0.3 mg/0.3 mL IJ SOAJ injection Inject 0.3 mg into the muscle once. For quinolone reaction  . etodolac (LODINE) 400 MG tablet Take 1 tablet (400 mg total) by mouth 2 (two) times daily.  . fluticasone (FLONASE) 50 MCG/ACT nasal spray Place 2 sprays into both nostrils daily.  .  folic acid (FOLVITE) 1 MG tablet Take 1 mg by mouth daily.   . magnesium oxide (MAG-OX) 400 MG tablet Take 400 mg by mouth daily.  . methotrexate (RHEUMATREX) 2.5 MG tablet Take 15 mg by mouth once a week.   . Multiple Vitamins-Minerals (PRESERVISION AREDS PO) Take by mouth 2 (two) times daily.  [name] neomycin-polymyxin b-dexamethasone (MAXITROL) 3.5-10000-0.1 SUSP PUT ONE DROP INTO THE LEFT EYE AS NEEDED  . neomycin-polymyxin-hydrocortisone (CORTISPORIN) otic solution Place 3 drops into the left ear 3 (three) times daily.  . nitrofurantoin (MACRODANTIN) 100 MG capsule TAKE ONE (1) CAPSULE EACH DAY FOR UTI prevention  . polyethylene glycol powder (GLYCOLAX/MIRALAX) powder MIX 17GM (AS MARKED IN BOTTLE TOP) IN 8 OUNCES OF WATER, MIX AND DRINK TWICE A DAY AS NEEDED FOR MODERATE CONSTIPATION.  [name] tiotropium (SPIRIVA HANDIHALER) 18 MCG inhalation capsule Place 1 capsule (18 mcg total) into inhaler and inhale daily.  [name] dextromethorphan 15 MG/5ML syrup Take 10 mLs by mouth 4 (four) times daily as needed for cough. Reported on 09/23/2015  . dextromethorphan-guaiFENesin (MUCINEX DM) 30-600 MG 12hr tablet Take 1 tablet by mouth 2 (two) times daily. Reported on 09/23/2015  . Fluticasone-Salmeterol (ADVAIR DISKUS) 250-50 MCG/DOSE AEPB Inhale 1 puff into the lungs 2 (two) times daily.   No current facility-administered medications on file prior to visit.    Review of Systems Per HPI unless  specifically indicated in ROS section     Objective:    BP 128/70 mmHg  Pulse 76  Temp(Src) 98 F (36.7 C) (Oral)  Wt 187 lb 4 oz (84.936 kg)  Wt Readings from Last 3 Encounters:  09/23/15 187 lb 4 oz (84.936 kg)  08/30/15 189 lb 3.2 oz (85.821 kg)  08/14/15 191 lb 3.2 oz (86.728 kg)    Physical Exam  Constitutional: She appears well-developed and well-nourished. No distress.  HENT:  Mouth/Throat: Oropharynx is clear and moist. No oropharyngeal exudate.  Cardiovascular: Normal rate, regular rhythm, normal heart  sounds and intact distal pulses.   No murmur heard. Pulmonary/Chest: Effort normal and breath sounds normal. No respiratory distress. She has no wheezes. She has no rales.  Musculoskeletal: She exhibits no edema.  Skin: Skin is warm and dry. No rash noted.  Psychiatric: She has a normal mood and affect.  Nursing note and vitals reviewed.      Assessment & Plan:  Over 25 minutes were spent face-to-face with the patient during this encounter and >50% of that time was spent on counseling and coordination of care   Problem List Items Addressed This Visit    Seronegative rheumatoid arthritis (HCC)    Appreciate rheum care. On prednisone 5mg  daily + plaquenil + MTX      Relevant Medications   hydroxychloroquine (PLAQUENIL) 200 MG tablet   predniSONE (DELTASONE) 5 MG tablet   oxyCODONE-acetaminophen (PERCOCET) 7.5-325 MG tablet   methocarbamol (ROBAXIN) 500 MG tablet   GERD (gastroesophageal reflux disease)   Relevant Medications   pantoprazole (PROTONIX) 40 MG tablet   GAD (generalized anxiety disorder)    Continue valium/xanax. Update UDS today.      Chronic pain syndrome - Primary    Refilled oxycodone. Update UDS today.      Abnormal drug screen    Advised we need to update UDS today - she states she already provided sample prior to visit today - will send off for UDS.           Follow up plan: Return in about 6 months (around 03/25/2016).

## 2015-09-26 ENCOUNTER — Other Ambulatory Visit: Payer: Self-pay | Admitting: *Deleted

## 2015-09-26 MED ORDER — NEOMYCIN-POLYMYXIN-HC 3.5-10000-1 OT SOLN: 3.0000 [drp] | Freq: Three times a day (TID) | OTIC | Status: DC

## 2015-09-26 MED ORDER — POLYETHYLENE GLYCOL 3350 17 GM/SCOOP PO POWD: ORAL | Status: DC

## 2015-09-30 ENCOUNTER — Ambulatory Visit: Payer: Medicare Other | Admitting: Family Medicine

## 2015-10-02 ENCOUNTER — Other Ambulatory Visit: Payer: Self-pay | Admitting: *Deleted

## 2015-10-02 MED ORDER — NEOMYCIN-POLYMYXIN-DEXAMETH 3.5-10000-0.1 OP SUSP: OPHTHALMIC | Status: DC

## 2015-10-03 ENCOUNTER — Telehealth: Payer: Self-pay | Admitting: Family Medicine

## 2015-10-03 NOTE — Telephone Encounter (Signed)
Patient notified and letters/med lists placed up front for pick up.

## 2015-10-03 NOTE — Telephone Encounter (Signed)
Letters pt previously requested have been written and in chart. plz print out along with med list and notify pt ready to pick up. 1 letter for [employer] and another addressed to [employer].

## 2015-10-06 ENCOUNTER — Encounter: Payer: Self-pay | Admitting: Family Medicine

## 2015-10-15 ENCOUNTER — Encounter: Payer: Self-pay | Admitting: Family Medicine

## 2015-10-16 ENCOUNTER — Encounter: Payer: Self-pay | Admitting: Podiatry

## 2015-10-16 ENCOUNTER — Ambulatory Visit (INDEPENDENT_AMBULATORY_CARE_PROVIDER_SITE_OTHER): Payer: Medicare Other | Admitting: Podiatry

## 2015-10-16 VITALS — BP 127/67 | HR 76 | Resp 16

## 2015-10-16 DIAGNOSIS — M0609 Rheumatoid arthritis without rheumatoid factor, multiple sites: Secondary | ICD-10-CM | POA: Diagnosis not present

## 2015-10-16 DIAGNOSIS — M76821 Posterior tibial tendinitis, right leg: Secondary | ICD-10-CM

## 2015-10-16 DIAGNOSIS — Z79899 Other long term (current) drug therapy: Secondary | ICD-10-CM | POA: Diagnosis not present

## 2015-10-16 DIAGNOSIS — M722 Plantar fascial fibromatosis: Secondary | ICD-10-CM

## 2015-10-16 NOTE — Progress Notes (Signed)
She presents today with chief complaint of pain to the insertion site of the tibialis anterior tendon and medial aspect of the foot in that general area. Since this been bothered her for the last few weeks and she is trying to move houses and it has really started to hurt.  Objective: Pulses are palpable. No calf pain. She has tenderness on insertion of the tibialis anterior tendon there does not demonstrate any type of tear to this area. She has some osteoarthritic changes as seen on old radiographs first metatarsal medial cuneiform joint posterior tibial tendon is without pain at this point.  Assessment: Capsulitis tendinitis medial aspect right foot.  Plan: Injected the area today with Kenalog and local anesthetic making sure not to inject into the tendon itself. This alleviated her symptoms almost immediately.

## 2015-10-21 ENCOUNTER — Ambulatory Visit: Payer: Medicare Other | Admitting: Internal Medicine

## 2015-10-22 ENCOUNTER — Other Ambulatory Visit: Payer: Self-pay | Admitting: *Deleted

## 2015-10-22 NOTE — Telephone Encounter (Signed)
Faxed refill request.  Notation is made that the patient would like them to be dated all the same date, please.  (She is saying the 17th).   Last Filled:   Alprazolam 60 tablet 0 09/23/2015  Last Filled:   Diazepam  60 tablet 0 09/23/2015  Last Filled:   Oxycodone  90 tablet 0 09/23/2015  Last office visit:   09/23/15   Please advise.

## 2015-10-23 ENCOUNTER — Encounter: Payer: Self-pay | Admitting: Internal Medicine

## 2015-10-23 ENCOUNTER — Ambulatory Visit (INDEPENDENT_AMBULATORY_CARE_PROVIDER_SITE_OTHER): Payer: Medicare Other | Admitting: Internal Medicine

## 2015-10-23 VITALS — BP 128/74 | HR 81 | Ht 66.0 in | Wt 192.2 lb

## 2015-10-23 DIAGNOSIS — K589 Irritable bowel syndrome without diarrhea: Secondary | ICD-10-CM

## 2015-10-23 DIAGNOSIS — R05 Cough: Secondary | ICD-10-CM

## 2015-10-23 DIAGNOSIS — K219 Gastro-esophageal reflux disease without esophagitis: Secondary | ICD-10-CM | POA: Diagnosis not present

## 2015-10-23 DIAGNOSIS — R059 Cough, unspecified: Secondary | ICD-10-CM

## 2015-10-23 DIAGNOSIS — J328 Other chronic sinusitis: Secondary | ICD-10-CM

## 2015-10-23 MED ORDER — DIAZEPAM 10 MG PO TABS: 10.0000 mg | ORAL_TABLET | Freq: Two times a day (BID) | ORAL | Status: DC

## 2015-10-23 MED ORDER — ESOMEPRAZOLE MAGNESIUM 40 MG PO CPDR: 40.0000 mg | DELAYED_RELEASE_CAPSULE | Freq: Every day | ORAL | Status: DC

## 2015-10-23 MED ORDER — CETIRIZINE HCL 10 MG PO TABS: 10.0000 mg | ORAL_TABLET | Freq: Every day | ORAL | Status: DC

## 2015-10-23 MED ORDER — ALBUTEROL SULFATE HFA 108 (90 BASE) MCG/ACT IN AERS: 2.0000 | INHALATION_SPRAY | RESPIRATORY_TRACT | Status: DC | PRN

## 2015-10-23 MED ORDER — OXYCODONE-ACETAMINOPHEN 7.5-325 MG PO TABS: ORAL_TABLET | ORAL | Status: DC

## 2015-10-23 MED ORDER — ALPRAZOLAM 1 MG PO TABS: 1.0000 mg | ORAL_TABLET | Freq: Two times a day (BID) | ORAL | Status: DC

## 2015-10-23 MED ORDER — FLUTICASONE PROPIONATE 50 MCG/ACT NA SUSP: 2.0000 | Freq: Every day | NASAL | Status: DC

## 2015-10-23 NOTE — Telephone Encounter (Signed)
Pt left v/m requesting status of multiple med refills.

## 2015-10-23 NOTE — Patient Instructions (Signed)
Chronic Bronchitis Chronic bronchitis is a lasting inflammation of the bronchial tubes, which are the tubes that carry air into your lungs. This is inflammation that occurs:   On most days of the week.   For at least three months at a time.   Over a period of two years in a row. When the bronchial tubes are inflamed, they start to produce mucus. The inflammation and buildup of mucus make it more difficult to breathe. Chronic bronchitis is usually a permanent problem and is one type of chronic obstructive pulmonary disease (COPD). People with chronic bronchitis are at greater risk for getting repeated colds, or respiratory infections. CAUSES  Chronic bronchitis most often occurs in people who have:  Long-standing, severe asthma.  A history of smoking.  Asthma and who also smoke. SIGNS AND SYMPTOMS  Chronic bronchitis may cause the following:   A cough that brings up mucus (productive cough).  Shortness of breath.  Early morning headache.  Wheezing.  Chest discomfort.   Recurring respiratory infections. DIAGNOSIS  Your health care provider may confirm the diagnosis by:  Taking your medical history.  Performing a physical exam.  Taking a chest X-ray.   Performing pulmonary function tests. TREATMENT  Treatment involves controlling symptoms with medicines, oxygen therapy, or making lifestyle changes, such as exercising and eating a healthy, well-balanced diet. Medicines could include:  Inhalers to improve air flow in and out of your lungs.  Antibiotics to treat bacterial infections, such as pneumonia, sinus infections, and acute bronchitis. As a preventative measure, your health care provider may recommend routine vaccinations for influenza and pneumonia. This is to prevent infection and hospitalization since you may be more at risk for these types of infections.  HOME CARE INSTRUCTIONS  Take medicines only as directed by your health care provider.   If you smoke  cigarettes, chew tobacco, or use electronic cigarettes, quit. If you need help quitting, ask your health care provider.  Avoid pollen, dust, animal dander, molds, smoke, and other things that cause shortness of breath or wheezing attacks.  Talk to your health care provider about possible exercise routines. Regular exercise is very important to help you feel better.  If you are prescribed oxygen use at home follow these guidelines:  Never smoke while using oxygen. Oxygen does not burn or explode, but flammable materials will burn faster in the presence of oxygen.  Keep a fire extinguisher close by. Let your fire department know that you have oxygen in your home.  Warn visitors not to smoke near you when you are using oxygen. Put up "no smoking" signs in your home where you most often use the oxygen.  Regularly test your smoke detectors at home to make sure they work. If you receive care in your home from a nurse or other health care provider, he or she may also check to make sure your smoke detectors work.  Ask your health care provider whether you would benefit from a pulmonary rehabilitation program.  Do not wait to get medical care if you have any concerning symptoms. Delays could cause permanent injury and may be life threatening. SEEK MEDICAL CARE IF:  You have increased coughing or shortness of breath or both.  You have muscle aches.  You have chest pain.  Your mucus gets thicker.  Your mucus changes from clear or white to yellow, green, gray, or bloody. SEEK IMMEDIATE MEDICAL CARE IF:  Your usual medicines do not stop your wheezing.   You have increased difficulty breathing.     You have any problems with the medicine you are taking, such as a rash, itching, swelling, or trouble breathing. MAKE SURE YOU:   Understand these instructions.  Will watch your condition.  Will get help right away if you are not doing well or get worse.   This information is not intended to  replace advice given to you by your health care provider. Make sure you discuss any questions you have with your health care provider.   Document Released: 04/16/2006 Document Revised: 07/20/2014 Document Reviewed: 08/07/2013 Elsevier Interactive Patient Education 2016 Elsevier Inc.  

## 2015-10-23 NOTE — Progress Notes (Signed)
[facility] Pulmonary Medicine Consultation      Date: 10/23/2015,   MRN# [id number] Kaylee [name] [birth year] Code Status:  Hosp day:@LENGTHOFSTAYDAYS @ Referring MD: @ATDPROV @     PCP:      AdmissionWeight: 192 lb 3.2 oz (87.181 kg)                 CurrentWeight: 192 lb 3.2 oz (87.181 kg) Kaylee [name] is a 55 y.o. old female seen in consultation for chronic cough and SOB.  SYNOPSIS: patient seen for symptoms of COPD, previous heavy smoker   CHIEF COMPLAINT:  Follow up for bronchitis    HISTORY OF PRESENT ILLNESS  Patient has no new complaints today, feels well, no SOB, no fevers , just a hoarse voice.  After further assessment, patient has NL PFT and 53 and normal ONO She has been having recurrent sinus infections which then cause her to have acute bronchitis episodes-uses albuterol as needed and this helps She has constant nasal congestion, she states that she has had ENT surgery in the past  no signs of infection at this time  [facility] complaining of GI symptoms today   Current Medication:  Current outpatient prescriptions:  .  albuterol (PROVENTIL HFA;VENTOLIN HFA) 108 (90 BASE) MCG/ACT inhaler, Inhale 2 puffs into the lungs every 6 (six) hours as needed., Disp: 1 Inhaler, Rfl: 12 .  ALPRAZolam (XANAX) 1 MG tablet, Take 1 tablet (1 mg total) by mouth 2 (two) times daily., Disp: 60 tablet, Rfl: 0 .  aspirin 81 MG tablet, Take 81 mg by mouth daily., Disp: , Rfl:  .  atorvastatin (LIPITOR) 10 MG tablet, Take 1 tablet (10 mg total) by mouth daily., Disp: 90 tablet, Rfl: 3 .  b complex vitamins tablet, Take 1 tablet by mouth daily., Disp: , Rfl:  .  BIOTIN PO, Take by mouth daily., Disp: , Rfl:  .  calcium-vitamin D (OSCAL) 250-125 MG-UNIT per tablet, Take 2 tablets by mouth daily., Disp: , Rfl:  .  cetirizine (ZYRTEC) 10 MG tablet, Take 10 mg by mouth daily., Disp: , Rfl:  .  Cyanocobalamin (B-12) 1000 MCG CAPS, Take 1 capsule by mouth daily., Disp: 90 capsule,  Rfl: 1 .  dextromethorphan 15 MG/5ML syrup, Take 10 mLs by mouth 4 (four) times daily as needed for cough. Reported on 09/23/2015, Disp: , Rfl:  .  dextromethorphan-guaiFENesin (MUCINEX DM) 30-600 MG 12hr tablet, Take 1 tablet by mouth 2 (two) times daily. Reported on 09/23/2015, Disp: , Rfl:  .  diazepam (VALIUM) 10 MG tablet, Take 1 tablet (10 mg total) by mouth 2 (two) times daily., Disp: 60 tablet, Rfl: 0 .  dicyclomine (BENTYL) 10 MG capsule, Take 10 mg by mouth every 6 (six) hours., Disp: , Rfl:  .  EPINEPHrine 0.3 mg/0.3 mL IJ SOAJ injection, Inject 0.3 mg into the muscle once. For quinolone reaction, Disp: , Rfl:  .  etodolac (LODINE) 400 MG tablet, Take 1 tablet (400 mg total) by mouth 2 (two) times daily., Disp: 180 tablet, Rfl: 3 .  fluticasone (FLONASE) 50 MCG/ACT nasal spray, Place 2 sprays into both nostrils daily., Disp: 16 g, Rfl: 6 .  folic acid (FOLVITE) 1 MG tablet, Take 1 mg by mouth daily. , Disp: , Rfl:  .  furosemide (LASIX) 20 MG tablet, TAKE ONE (1) TABLET EACH DAY AS NEEDED, Disp: 90 tablet, Rfl: 3 .  hydroxychloroquine (PLAQUENIL) 200 MG tablet, Take 200 mg by mouth daily., Disp: , Rfl:  .  magnesium oxide (MAG-OX)  400 MG tablet, Take 400 mg by mouth daily., Disp: , Rfl:  .  methocarbamol (ROBAXIN) 500 MG tablet, TAKE ONE (1) TABLET BY MOUTH THREE TIMESA DAY AS NEEDED FOR MUSCLE SPASMS, Disp: 60 tablet, Rfl: 6 .  methotrexate (RHEUMATREX) 2.5 MG tablet, Take 15 mg by mouth once a week. , Disp: , Rfl:  .  Multiple Vitamins-Minerals (PRESERVISION AREDS PO), Take by mouth 2 (two) times daily., Disp: , Rfl:  .  neomycin-polymyxin b-dexamethasone (MAXITROL) 3.5-10000-0.1 SUSP, PUT ONE DROP INTO THE LEFT EYE AS NEEDED, Disp: 5 mL, Rfl: 1 .  neomycin-polymyxin-hydrocortisone (CORTISPORIN) otic solution, Place 3 drops into the left ear 3 (three) times daily., Disp: 10 mL, Rfl: 3 .  nitrofurantoin (MACRODANTIN) 100 MG capsule, TAKE ONE (1) CAPSULE EACH DAY FOR UTI prevention, Disp:  90 capsule, Rfl: 3 .  omega-3 acid ethyl esters (LOVAZA) 1 g capsule, TWO CAPSULES EACH DAY, Disp: , Rfl:  .  oxyCODONE-acetaminophen (PERCOCET) 7.5-325 MG tablet, TAKE ONE TABLET EVERY EIGHT (8) HOURS ASNEEDED FOR PAIN, Disp: 90 tablet, Rfl: 0 .  pantoprazole (PROTONIX) 40 MG tablet, Take 1 tablet (40 mg total) by mouth daily., Disp: 90 tablet, Rfl: 3 .  polyethylene glycol powder (GLYCOLAX/MIRALAX) powder, MIX 17GM (AS MARKED IN BOTTLE TOP) IN 8 OUNCES OF WATER, MIX AND DRINK TWICE A DAY AS NEEDED FOR MODERATE CONSTIPATION., Disp: 850 g, Rfl: 3 .  potassium chloride (K-DUR) 10 MEQ tablet, ONE TABLET EACH DAY AS NEEDED, Disp: 90 tablet, Rfl: 3 .  predniSONE (DELTASONE) 5 MG tablet, Take 5 mg by mouth daily with breakfast. Reported on 10/23/2015, Disp: , Rfl:  .  vitamin A 8000 UNIT capsule, Take 8,000 Units by mouth every other day., Disp: , Rfl:  .  ADVAIR DISKUS 250-50 MCG/DOSE AEPB, Reported on 10/23/2015, Disp: , Rfl:  .  tiotropium (SPIRIVA HANDIHALER) 18 MCG inhalation capsule, Place 1 capsule (18 mcg total) into inhaler and inhale daily. (Patient not taking: Reported on 10/23/2015), Disp: 90 capsule, Rfl: 3    ALLERGIES   Avelox; Hydrocodone; Gabapentin; Methadone hcl; Morphine; Quinolones; and Sulfonamide derivatives     REVIEW OF SYSTEMS   Review of Systems  Constitutional: Negative for fever, chills and malaise/fatigue.  HENT: Positive for congestion.   Respiratory: Negative for cough, hemoptysis, sputum production, shortness of breath and wheezing.   Cardiovascular: Negative for chest pain and palpitations.  Gastrointestinal: Positive for heartburn, nausea, abdominal pain, diarrhea and constipation. Negative for vomiting.  Musculoskeletal: Negative.   Psychiatric/Behavioral: The patient is nervous/anxious.   All other systems reviewed and are negative.    VS: BP 128/74 mmHg  Pulse 81  Ht 5\' 6"  (1.676 m)  Wt 192 lb 3.2 oz (87.181 kg)  BMI 31.04 kg/m2  SpO2 96%      PHYSICAL EXAM  Physical Exam  Constitutional: She appears well-developed and well-nourished. No distress.  HENT:  Head: Normocephalic and atraumatic.  Mouth/Throat: No oropharyngeal exudate.  Eyes: No scleral icterus.  Cardiovascular: Normal rate, regular rhythm and normal heart sounds.   No murmur heard. Pulmonary/Chest: No respiratory distress. She has no wheezes. She has no rales.  Abdominal: Soft. Bowel sounds are normal.  Skin: She is not diaphoretic.  Psychiatric: She has a normal mood and affect.   Ct chest 08/28/15 shows no acute process, possible early ILD- ima  ima Images reve  ASSESSMENT/PLAN   55 yo white female with long standing history of tobacco abuse, clinical signs and symptoms of chronic sinus infections leading to acute bouts  of recurrent bronchitis.   1.no need for prednisone therapy at this time 2.no abx at this time 3.stop all BD therapy at this time. 4.NEEDS ENT consult to assess sinus disease 5.continue zyrtec zyrtec and change to Nexium. 6.Gi referral for IBS  follow up in 6 months   The Patient requires high complexity decision making for assessment and support, frequent evaluation and titration of therapies, application of advanced monitoring technologies and extensive interpretation of multiple databases.  Patient is satisfied with Plan of action and management.    Lucie Leather, M.D.  [facility]  Medical Director [facility] Medical Director [facility] Cardio-Pulmonary Department

## 2015-10-23 NOTE — Addendum Note (Signed)
Addended by: Maxwell Marion A on: 10/23/2015 10:01 AM   Modules accepted: Orders

## 2015-10-23 NOTE — Telephone Encounter (Signed)
Printed and in kims'box  

## 2015-10-24 NOTE — Telephone Encounter (Signed)
Patient notified and Rx's placed up front for pick up. 

## 2015-10-28 DIAGNOSIS — M0609 Rheumatoid arthritis without rheumatoid factor, multiple sites: Secondary | ICD-10-CM | POA: Diagnosis not present

## 2015-10-28 DIAGNOSIS — M150 Primary generalized (osteo)arthritis: Secondary | ICD-10-CM | POA: Diagnosis not present

## 2015-10-28 DIAGNOSIS — J449 Chronic obstructive pulmonary disease, unspecified: Secondary | ICD-10-CM | POA: Diagnosis not present

## 2015-11-18 ENCOUNTER — Other Ambulatory Visit: Payer: Self-pay

## 2015-11-18 MED ORDER — DIAZEPAM 10 MG PO TABS: 10.0000 mg | ORAL_TABLET | Freq: Two times a day (BID) | ORAL | Status: DC

## 2015-11-18 MED ORDER — OXYCODONE-ACETAMINOPHEN 7.5-325 MG PO TABS: ORAL_TABLET | ORAL | Status: DC

## 2015-11-18 MED ORDER — ALPRAZOLAM 1 MG PO TABS: 1.0000 mg | ORAL_TABLET | Freq: Two times a day (BID) | ORAL | Status: DC

## 2015-11-18 NOTE — Telephone Encounter (Signed)
Pt left v/m; pt having car problems and request early pick up with rx posted for 11/27/15 for xanax (last printed #60 on 10/23/15),diazepam (last printed # 60 on 10/23/15) and oxycodone apap (last printed # 90 on 10/23/15). Pt request [facility]. [facility]. Pt last seen 09/23/15.

## 2015-11-18 NOTE — Telephone Encounter (Signed)
Printed and in Kim's box 

## 2015-11-19 NOTE — Telephone Encounter (Signed)
Patient notified and Rx's placed up front for pick up. 

## 2015-12-20 ENCOUNTER — Other Ambulatory Visit: Payer: Self-pay

## 2015-12-20 NOTE — Telephone Encounter (Signed)
Pt left v/m requesting rxs for alprazolam (last printed # 60 on 11/18/15), diazepam (last printed # 60 on 11/18/15 and oxycodone apap (last printed # 90 on 11/18/15). Pt last seen on 09/23/15. Pt request to pick up on 12/27/15.

## 2015-12-23 MED ORDER — ALPRAZOLAM 1 MG PO TABS: 1.0000 mg | ORAL_TABLET | Freq: Two times a day (BID) | ORAL | Status: DC

## 2015-12-23 MED ORDER — DIAZEPAM 10 MG PO TABS: 10.0000 mg | ORAL_TABLET | Freq: Two times a day (BID) | ORAL | Status: DC

## 2015-12-23 MED ORDER — OXYCODONE-ACETAMINOPHEN 7.5-325 MG PO TABS: ORAL_TABLET | ORAL | Status: DC

## 2015-12-23 NOTE — Telephone Encounter (Signed)
Printed and in Kim's box 

## 2015-12-24 ENCOUNTER — Encounter: Payer: Self-pay | Admitting: Family Medicine

## 2015-12-24 DIAGNOSIS — Z79891 Long term (current) use of opiate analgesic: Secondary | ICD-10-CM | POA: Diagnosis not present

## 2015-12-24 DIAGNOSIS — Z79899 Other long term (current) drug therapy: Secondary | ICD-10-CM | POA: Diagnosis not present

## 2015-12-24 NOTE — Telephone Encounter (Signed)
Patient notified and Rx's placed up front for pick up. Pt due for UDS also. Noted on Rx for office staff.

## 2015-12-25 ENCOUNTER — Encounter: Payer: Self-pay | Admitting: Primary Care

## 2015-12-25 ENCOUNTER — Ambulatory Visit (INDEPENDENT_AMBULATORY_CARE_PROVIDER_SITE_OTHER): Payer: Medicare Other | Admitting: Primary Care

## 2015-12-25 VITALS — BP 122/82 | HR 83 | Temp 98.0°F | Ht 66.0 in | Wt 186.1 lb

## 2015-12-25 DIAGNOSIS — Z113 Encounter for screening for infections with a predominantly sexual mode of transmission: Secondary | ICD-10-CM | POA: Diagnosis not present
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
## 2015-12-25 NOTE — Addendum Note (Signed)
Addended by: Tawnya Crook on: 12/25/2015 12:28 PM   Modules accepted: Medications

## 2015-12-25 NOTE — Progress Notes (Signed)
Pre visit review using our clinic review tool, if applicable. No additional management support is needed unless otherwise documented below in the visit note. 

## 2015-12-25 NOTE — Patient Instructions (Signed)
Complete lab work prior to leaving today. I will notify you of your results once received.   It was a pleasure meeting you!  

## 2015-12-26 LAB — HSV(HERPES SMPLX)ABS-I+II(IGG+IGM)-BLD
HERPES SIMPLEX VRS I-IGM AB (EIA): 1.02 {index}
HSV 1 Glycoprotein G Ab, IgG: <0.9 Index (ref <0.90)
HSV 2 GLYCOPROTEIN G AB, IGG: 7.44 {index} — AB (ref <0.90)

## 2015-12-26 LAB — GC/CHLAMYDIA PROBE AMP
CT Probe RNA: NOT DETECTED
GC Probe RNA: NOT DETECTED

## 2015-12-26 LAB — HIV ANTIBODY (ROUTINE TESTING W REFLEX): HIV: NONREACTIVE

## 2015-12-26 LAB — HEPATITIS C ANTIBODY: HCV AB: NEGATIVE

## 2015-12-26 LAB — RPR

## 2015-12-30 ENCOUNTER — Encounter: Payer: Self-pay | Admitting: Family Medicine

## 2015-12-30 ENCOUNTER — Other Ambulatory Visit: Payer: Self-pay | Admitting: *Deleted

## 2015-12-30 NOTE — Telephone Encounter (Signed)
Ok to refill 

## 2015-12-31 ENCOUNTER — Other Ambulatory Visit: Payer: Self-pay | Admitting: Internal Medicine

## 2015-12-31 ENCOUNTER — Telehealth: Payer: Self-pay

## 2015-12-31 MED ORDER — DICYCLOMINE HCL 10 MG PO CAPS
10.0000 mg | ORAL_CAPSULE | Freq: Three times a day (TID) | ORAL | Status: DC | PRN
Start: 2015-12-31 — End: 2016-05-29

## 2015-12-31 NOTE — Telephone Encounter (Signed)
Please notify patient that the standard of care is to initiate preventative treatment after 2 or more outbreaks. She was just diagnosed and has had no outbreaks. She will need to discuss this with her PCP moving forward. FYI, PCP is out of the office until Monday next week.

## 2015-12-31 NOTE — Telephone Encounter (Signed)
Pt left v/m requesting cb about med to prevent herpes breakout; pts gentleman friend saw his provider and he was dx with genital herpes and was given med that started with an A; pt wants to get med to prevent breakouts sent to [facility]. Pt will be in class from 1:30-4:30. Pt request cb this morning.

## 2015-12-31 NOTE — Telephone Encounter (Signed)
Already spoken to patient this morning regarding this matter.

## 2016-01-09 DIAGNOSIS — Z79899 Other long term (current) drug therapy: Secondary | ICD-10-CM | POA: Diagnosis not present

## 2016-01-09 DIAGNOSIS — M0609 Rheumatoid arthritis without rheumatoid factor, multiple sites: Secondary | ICD-10-CM | POA: Diagnosis not present

## 2016-01-13 ENCOUNTER — Encounter: Payer: Self-pay | Admitting: Family Medicine

## 2016-01-21 ENCOUNTER — Other Ambulatory Visit: Payer: Self-pay

## 2016-01-21 MED ORDER — ALPRAZOLAM 1 MG PO TABS: 1.0000 mg | ORAL_TABLET | Freq: Two times a day (BID) | ORAL | Status: DC

## 2016-01-21 MED ORDER — DIAZEPAM 10 MG PO TABS: 10.0000 mg | ORAL_TABLET | Freq: Two times a day (BID) | ORAL | Status: DC

## 2016-01-21 MED ORDER — OXYCODONE-ACETAMINOPHEN 7.5-325 MG PO TABS: ORAL_TABLET | ORAL | Status: DC

## 2016-01-21 NOTE — Telephone Encounter (Signed)
Last OV for Pain 09-23-15 Next OV 03-29-16 Oxycodone last filled 12-28-15 #90 Alprazolam last filled 12-28-15 #60 Diazepam ;ast filled 12-28-15 #60 Diflucan Last filled 08-14-15 #7

## 2016-01-21 NOTE — Telephone Encounter (Signed)
Printed and in Kim's box 

## 2016-01-22 DIAGNOSIS — M0609 Rheumatoid arthritis without rheumatoid factor, multiple sites: Secondary | ICD-10-CM | POA: Diagnosis not present

## 2016-01-22 DIAGNOSIS — J449 Chronic obstructive pulmonary disease, unspecified: Secondary | ICD-10-CM | POA: Diagnosis not present

## 2016-01-22 NOTE — Telephone Encounter (Signed)
Left voice mail that rx is at the front desk ready for pick up.

## 2016-01-24 ENCOUNTER — Telehealth: Payer: Self-pay

## 2016-01-24 NOTE — Telephone Encounter (Signed)
PLEASE NOTE: All timestamps contained within this report are represented as [place] Standard Time. CONFIDENTIALTY NOTICE: This fax transmission is intended only for the addressee. It contains information that is legally privileged, confidential or otherwise protected from use or disclosure. If you are not the intended recipient, you are strictly prohibited from reviewing, disclosing, copying using or disseminating any of this information or taking any action in reliance on or regarding this information. If you have received this fax in error, please notify us immediately by telephone so that we can arrange for its return to [place]. Phone: [phone], Toll-Free: [phone], Fax: [phone] Page: 1 of 1 Call Id: [id number] [facility] Night - Client Nonclinical Telephone Record [facility] Medical Call Center Client [facility] Night - Client Client Site [facility] [place] - Night Physician [name] - MD Contact Type Call Who Is Calling Patient / Member / Family / Caregiver Caller Name Adina Puzzo Caller Phone Number [phone] Patient Name [facility] Call Type Message Only Information Provided Reason for Call Request for General Office Information Initial Comment Caller states her medications needs to be called in. Wants the office to call her back. Declined nurse triage. Additional Comment Call Closed By: Rowe Clack Transaction Date/Time: 01/23/2016 5:32:08 PM (ET)

## 2016-01-24 NOTE — Telephone Encounter (Signed)
Unable to reach pt by phone; voice mail box is full.

## 2016-01-27 ENCOUNTER — Other Ambulatory Visit: Payer: Self-pay | Admitting: Internal Medicine

## 2016-01-27 NOTE — Telephone Encounter (Signed)
Pt left v/m wanting cb for status of multiple refill request; rx are at front desk for pick up as noted in the 01/21/16 phone note. Continue multiple times this morning to reach pt but unable to leave message due to mail box being full.

## 2016-01-29 MED ORDER — ACYCLOVIR 400 MG PO TABS: 400.0000 mg | ORAL_TABLET | Freq: Three times a day (TID) | ORAL | Status: DC

## 2016-01-29 NOTE — Addendum Note (Signed)
Addended by: Joaquim Nam on: 01/29/2016 12:49 PM   Modules accepted: Orders

## 2016-01-29 NOTE — Telephone Encounter (Signed)
Pt called back and apologized problem with phone but pt did get med.

## 2016-01-29 NOTE — Telephone Encounter (Addendum)
Pt left v/m; pt has had herpes outbreak;  and did have break out with blisters 12/28/15 and kept them for 1 1/2 weeks. For the past 3 days pt has had flu like symptoms(pt read this was symptoms that go along with herpes outbreak) and perineal itching but pt does not see blisters. Pt wants preventative med sent to [facility]. Pt request cb.

## 2016-01-29 NOTE — Telephone Encounter (Signed)
Patient notified as instructed by telephone and verbalized understanding. 

## 2016-01-29 NOTE — Telephone Encounter (Signed)
Would treat as a looming/pending outbreak.   She can d/w pt PCP about long term proph med if recurrent sx in the future.  rx sent.  Thanks.

## 2016-02-17 ENCOUNTER — Ambulatory Visit: Payer: Medicare Other | Admitting: Podiatry

## 2016-02-20 ENCOUNTER — Other Ambulatory Visit: Payer: Self-pay | Admitting: *Deleted

## 2016-02-20 ENCOUNTER — Ambulatory Visit: Payer: Medicare Other | Admitting: Family Medicine

## 2016-02-20 MED ORDER — ALPRAZOLAM 1 MG PO TABS: 1.0000 mg | ORAL_TABLET | Freq: Two times a day (BID) | ORAL | 0 refills | Status: DC

## 2016-02-20 MED ORDER — DIAZEPAM 10 MG PO TABS: 10.0000 mg | ORAL_TABLET | Freq: Two times a day (BID) | ORAL | 0 refills | Status: DC

## 2016-02-20 MED ORDER — OXYCODONE-ACETAMINOPHEN 7.5-325 MG PO TABS: ORAL_TABLET | ORAL | 0 refills | Status: DC

## 2016-02-20 NOTE — Telephone Encounter (Signed)
Ok to refill? Last filled 01/21/16.

## 2016-02-20 NOTE — Telephone Encounter (Signed)
Printed and in Kim's box 

## 2016-02-21 NOTE — Telephone Encounter (Signed)
Attempted to contact patient. All numbers in chart are incorrect. Rx's placed up front for pick up.

## 2016-02-27 ENCOUNTER — Other Ambulatory Visit: Payer: Self-pay | Admitting: *Deleted

## 2016-02-27 MED ORDER — ACYCLOVIR 400 MG PO TABS: 400.0000 mg | ORAL_TABLET | Freq: Three times a day (TID) | ORAL | 1 refills | Status: DC

## 2016-02-27 NOTE — Telephone Encounter (Signed)
Faxed refill request. Last Filled:    15 tablet 0 01/29/2016  Last office visit:

## 2016-03-04 ENCOUNTER — Encounter: Payer: Self-pay | Admitting: Podiatry

## 2016-03-04 ENCOUNTER — Ambulatory Visit (INDEPENDENT_AMBULATORY_CARE_PROVIDER_SITE_OTHER): Payer: Medicare Other | Admitting: Podiatry

## 2016-03-04 DIAGNOSIS — M722 Plantar fascial fibromatosis: Secondary | ICD-10-CM | POA: Diagnosis not present

## 2016-03-04 DIAGNOSIS — G620 Drug-induced polyneuropathy: Secondary | ICD-10-CM | POA: Diagnosis not present

## 2016-03-04 DIAGNOSIS — M76821 Posterior tibial tendinitis, right leg: Secondary | ICD-10-CM

## 2016-03-04 DIAGNOSIS — T451X5A Adverse effect of antineoplastic and immunosuppressive drugs, initial encounter: Secondary | ICD-10-CM | POA: Diagnosis not present

## 2016-03-04 MED ORDER — GABAPENTIN 300 MG PO CAPS: 300.0000 mg | ORAL_CAPSULE | Freq: Three times a day (TID) | ORAL | 3 refills | Status: DC

## 2016-03-04 NOTE — Progress Notes (Signed)
She presents today complaining of burning pain to the plantar medial aspect of the right foot.  Objective: Vital signs are stable she is alert and 3 she has pain on palpation tubercle of the right foot pain on palpation of the plantar fascia right foot. No pain on palpation of the posterior tibial tendon today.  Assessment: Plantar fasciitis.  Plan: Start her on 300 mg gabapentin 3 times a day for pain. I also injected her right heel.

## 2016-03-18 ENCOUNTER — Other Ambulatory Visit: Payer: Self-pay | Admitting: *Deleted

## 2016-03-18 NOTE — Telephone Encounter (Signed)
Ok to refill? Last filled 02/20/16 #90, #60 and #60.

## 2016-03-19 ENCOUNTER — Encounter: Payer: Self-pay | Admitting: Family Medicine

## 2016-03-19 DIAGNOSIS — G8929 Other chronic pain: Secondary | ICD-10-CM | POA: Insufficient documentation

## 2016-03-19 MED ORDER — DIAZEPAM 10 MG PO TABS: 10.0000 mg | ORAL_TABLET | Freq: Two times a day (BID) | ORAL | 0 refills | Status: DC

## 2016-03-19 MED ORDER — ALPRAZOLAM 1 MG PO TABS: 1.0000 mg | ORAL_TABLET | Freq: Two times a day (BID) | ORAL | 0 refills | Status: DC

## 2016-03-19 MED ORDER — OXYCODONE-ACETAMINOPHEN 7.5-325 MG PO TABS: ORAL_TABLET | ORAL | 0 refills | Status: DC

## 2016-03-19 NOTE — Telephone Encounter (Signed)
Printed and in Kim's box 

## 2016-03-19 NOTE — Telephone Encounter (Signed)
Message left notifying patient and Rx's placed up front for pick up.  

## 2016-03-20 DIAGNOSIS — M0609 Rheumatoid arthritis without rheumatoid factor, multiple sites: Secondary | ICD-10-CM | POA: Diagnosis not present

## 2016-03-20 DIAGNOSIS — G8929 Other chronic pain: Secondary | ICD-10-CM | POA: Diagnosis not present

## 2016-03-20 DIAGNOSIS — M150 Primary generalized (osteo)arthritis: Secondary | ICD-10-CM | POA: Diagnosis not present

## 2016-03-20 DIAGNOSIS — M79671 Pain in right foot: Secondary | ICD-10-CM | POA: Diagnosis not present

## 2016-03-26 ENCOUNTER — Ambulatory Visit: Payer: Medicare Other

## 2016-03-26 ENCOUNTER — Encounter: Payer: Medicare Other | Admitting: Family Medicine

## 2016-03-27 ENCOUNTER — Other Ambulatory Visit: Payer: Self-pay | Admitting: *Deleted

## 2016-03-27 ENCOUNTER — Encounter: Payer: Self-pay | Admitting: *Deleted

## 2016-03-27 ENCOUNTER — Other Ambulatory Visit: Payer: Self-pay | Admitting: Internal Medicine

## 2016-03-27 ENCOUNTER — Ambulatory Visit (INDEPENDENT_AMBULATORY_CARE_PROVIDER_SITE_OTHER): Payer: Medicare Other | Admitting: Family Medicine

## 2016-03-27 ENCOUNTER — Encounter: Payer: Self-pay | Admitting: Family Medicine

## 2016-03-27 ENCOUNTER — Ambulatory Visit (INDEPENDENT_AMBULATORY_CARE_PROVIDER_SITE_OTHER): Payer: Medicare Other

## 2016-03-27 VITALS — BP 120/70 | HR 91 | Temp 97.7°F | Ht 65.0 in | Wt 193.5 lb

## 2016-03-27 DIAGNOSIS — M79671 Pain in right foot: Secondary | ICD-10-CM

## 2016-03-27 DIAGNOSIS — G8929 Other chronic pain: Secondary | ICD-10-CM

## 2016-03-27 DIAGNOSIS — R892 Abnormal level of other drugs, medicaments and biological substances in specimens from other organs, systems and tissues: Secondary | ICD-10-CM

## 2016-03-27 DIAGNOSIS — G629 Polyneuropathy, unspecified: Secondary | ICD-10-CM

## 2016-03-27 DIAGNOSIS — F411 Generalized anxiety disorder: Secondary | ICD-10-CM

## 2016-03-27 DIAGNOSIS — Z7189 Other specified counseling: Secondary | ICD-10-CM

## 2016-03-27 DIAGNOSIS — Z599 Problem related to housing and economic circumstances, unspecified: Secondary | ICD-10-CM

## 2016-03-27 DIAGNOSIS — Z0000 Encounter for general adult medical examination without abnormal findings: Secondary | ICD-10-CM | POA: Diagnosis not present

## 2016-03-27 DIAGNOSIS — Z79899 Other long term (current) drug therapy: Secondary | ICD-10-CM | POA: Diagnosis not present

## 2016-03-27 DIAGNOSIS — B009 Herpesviral infection, unspecified: Secondary | ICD-10-CM

## 2016-03-27 DIAGNOSIS — G894 Chronic pain syndrome: Secondary | ICD-10-CM | POA: Diagnosis not present

## 2016-03-27 DIAGNOSIS — Z1239 Encounter for other screening for malignant neoplasm of breast: Secondary | ICD-10-CM

## 2016-03-27 DIAGNOSIS — Z79891 Long term (current) use of opiate analgesic: Secondary | ICD-10-CM | POA: Diagnosis not present

## 2016-03-27 DIAGNOSIS — M0600 Rheumatoid arthritis without rheumatoid factor, unspecified site: Secondary | ICD-10-CM

## 2016-03-27 DIAGNOSIS — Z23 Encounter for immunization: Secondary | ICD-10-CM | POA: Diagnosis not present

## 2016-03-27 DIAGNOSIS — E785 Hyperlipidemia, unspecified: Secondary | ICD-10-CM | POA: Diagnosis not present

## 2016-03-27 LAB — URIC ACID: Uric Acid, Serum: 4.3 mg/dL (ref 2.4–7.0)

## 2016-03-27 LAB — COMPREHENSIVE METABOLIC PANEL
ALBUMIN: 4.3 g/dL (ref 3.5–5.2)
ALK PHOS: 49 U/L (ref 39–117)
ALT: 26 U/L (ref 0–35)
AST: 23 U/L (ref 0–37)
BILIRUBIN TOTAL: 0.5 mg/dL (ref 0.2–1.2)
BUN: 18 mg/dL (ref 6–23)
CALCIUM: 8.9 mg/dL (ref 8.4–10.5)
CO2: 28 meq/L (ref 19–32)
Chloride: 105 mEq/L (ref 96–112)
Creatinine, Ser: 0.84 mg/dL (ref 0.40–1.20)
GFR: 74.44 mL/min (ref >60.00)
Glucose, Bld: 88 mg/dL (ref 70–99)
Potassium: 4.1 mEq/L (ref 3.5–5.1)
Sodium: 140 mEq/L (ref 135–145)
Total Protein: 6.8 g/dL (ref 6.0–8.3)

## 2016-03-27 LAB — CBC WITH DIFFERENTIAL/PLATELET
BASOS ABS: 0 10*3/uL (ref 0.0–0.1)
Basophils Relative: 0.5 % (ref 0.0–3.0)
EOS ABS: 0.2 10*3/uL (ref 0.0–0.7)
Eosinophils Relative: 3.1 % (ref 0.0–5.0)
HEMATOCRIT: 39 % (ref 36.0–46.0)
HEMOGLOBIN: 13.3 g/dL (ref 12.0–15.0)
LYMPHS PCT: 28.8 % (ref 12.0–46.0)
Lymphs Abs: 2.2 10*3/uL (ref 0.7–4.0)
MCHC: 34.1 g/dL (ref 30.0–36.0)
MCV: 91.2 fl (ref 78.0–100.0)
MONOS PCT: 6.6 % (ref 3.0–12.0)
Monocytes Absolute: 0.5 10*3/uL (ref 0.1–1.0)
NEUTROS ABS: 4.6 10*3/uL (ref 1.4–7.7)
Neutrophils Relative %: 61 % (ref 43.0–77.0)
Platelets: 237 10*3/uL (ref 150.0–400.0)
RBC: 4.28 Mil/uL (ref 3.87–5.11)
RDW: 13.5 % (ref 11.5–15.5)
WBC: 7.6 10*3/uL (ref 4.0–10.5)

## 2016-03-27 LAB — LIPID PANEL
CHOL/HDL RATIO: 2
CHOLESTEROL: 217 mg/dL — AB (ref 0–200)
HDL: 87.8 mg/dL (ref >39.00)
LDL Cholesterol: 100 mg/dL — ABNORMAL HIGH (ref 0–99)
NonHDL: 129.22
TRIGLYCERIDES: 145 mg/dL (ref 0.0–149.0)
VLDL: 29 mg/dL (ref 0.0–40.0)

## 2016-03-27 LAB — FOLATE: Folate: >23.7 ng/mL (ref >5.9)

## 2016-03-27 MED ORDER — ATORVASTATIN CALCIUM 10 MG PO TABS: 10.0000 mg | ORAL_TABLET | Freq: Every day | ORAL | 0 refills | Status: DC

## 2016-03-27 MED ORDER — PREGABALIN 75 MG PO CAPS: 75.0000 mg | ORAL_CAPSULE | Freq: Three times a day (TID) | ORAL | 1 refills | Status: DC

## 2016-03-27 MED ORDER — ACYCLOVIR 400 MG PO TABS: 400.0000 mg | ORAL_TABLET | Freq: Three times a day (TID) | ORAL | 1 refills | Status: DC

## 2016-03-27 MED ORDER — AMITRIPTYLINE HCL 25 MG PO TABS: 25.0000 mg | ORAL_TABLET | Freq: Every day | ORAL | 3 refills | Status: DC
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
## 2016-03-27 NOTE — Patient Instructions (Signed)
Kaylee [name] , Thank you for taking time to come for your Medicare Wellness Visit. I appreciate your ongoing commitment to your health goals. Please review the following plan we discussed and let me know if I can assist you in the future.   These are the goals we discussed: Goals    . Increase water intake          Starting 03/27/2016, I will attempt to drink at least 6-8 glasses of water daily.        This is a list of the screening recommended for you and due dates:  Health Maintenance  Topic Date Due  . DTaP/Tdap/Td vaccine (1 - Tdap) 07/14/2010  . Flu Shot  02/11/2016  . Pap Smear  11/08/2016  . Mammogram  03/24/2017  . Tetanus Vaccine  07/13/2020  . Colon Cancer Screening  09/23/2023  .  Hepatitis C: One time screening is recommended by Center for Disease Control  (CDC) for  adults born from [birth year] through 1966.   Completed  . HIV Screening  Completed   Preventive Care for Adults  A healthy lifestyle and preventive care can promote health and wellness. Preventive health guidelines for adults include the following key practices.  . A routine yearly physical is a good way to check with your health care provider about your health and preventive screening. It is a chance to share any concerns and updates on your health and to receive a thorough exam.  . Visit your dentist for a routine exam and preventive care every 6 months. Brush your teeth twice a day and floss once a day. Good oral hygiene prevents tooth decay and gum disease.  . The frequency of eye exams is based on your age, health, family medical history, use  of contact lenses, and other factors. Follow your health care provider's ecommendations for frequency of eye exams.  . Eat a healthy diet. Foods like vegetables, fruits, whole grains, low-fat dairy products, and lean protein foods contain the nutrients you need without too many calories. Decrease your intake of foods high in solid fats, added sugars, and salt. Eat the  right amount of calories for you. Get information about a proper diet from your health care provider, if necessary.  . Regular physical exercise is one of the most important things you can do for your health. Most adults should get at least 150 minutes of moderate-intensity exercise (any activity that increases your heart rate and causes you to sweat) each week. In addition, most adults need muscle-strengthening exercises on 2 or more days a week.  Silver Sneakers may be a benefit available to you. To determine eligibility, you may visit the website: www.silversneakers.com or contact program at 920 477 7042 Mon-Fri between 8AM-8PM.   . Maintain a healthy weight. The body mass index (BMI) is a screening tool to identify possible weight problems. It provides an estimate of body fat based on height and weight. Your health care provider can find your BMI and can help you achieve or maintain a healthy weight.   For adults 20 years and older: ? A BMI below 18.5 is considered underweight. ? A BMI of 18.5 to 24.9 is normal. ? A BMI of 25 to 29.9 is considered overweight. ? A BMI of 30 and above is considered obese.   . Maintain normal blood lipids and cholesterol levels by exercising and minimizing your intake of saturated fat. Eat a balanced diet with plenty of fruit and vegetables. Blood tests for lipids and cholesterol should  begin at age 92 and be repeated every 5 years. If your lipid or cholesterol levels are high, you are over 50, or you are at high risk for heart disease, you may need your cholesterol levels checked more frequently. Ongoing high lipid and cholesterol levels should be treated with medicines if diet and exercise are not working.  . If you smoke, find out from your health care provider how to quit. If you do not use tobacco, please do not start.  . If you choose to drink alcohol, please do not consume more than 2 drinks per day. One drink is considered to be 12 ounces (355 mL) of  beer, 5 ounces (148 mL) of wine, or 1.5 ounces (44 mL) of liquor.  . If you are 20-75 years old, ask your health care provider if you should take aspirin to prevent strokes.  . Use sunscreen. Apply sunscreen liberally and repeatedly throughout the day. You should seek shade when your shadow is shorter than you. Protect yourself by wearing long sleeves, pants, a wide-brimmed hat, and sunglasses year round, whenever you are outdoors.  . Once a month, do a whole body skin exam, using a mirror to look at the skin on your back. Tell your health care provider of new moles, moles that have irregular borders, moles that are larger than a pencil eraser, or moles that have changed in shape or color.
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
## 2016-03-27 NOTE — Progress Notes (Signed)
Pre visit review using our clinic review tool, if applicable. No additional management support is needed unless otherwise documented below in the visit note. 

## 2016-03-27 NOTE — Progress Notes (Signed)
PCP notes:   Health maintenance:  Flu vaccine - pt will take vaccine at CPE  Abnormal screenings:   Depression score: 21 Fall risk: hx of multiple falls with injury  Patient concerns:   Pt reports increased pain and neuropathy in feet.   Nurse concerns:  None  Next PCP appt:   03/27/16 @ 0930

## 2016-03-27 NOTE — Patient Instructions (Addendum)
Flu shot today Stop gabapentin. Start lyrica 75mg  daily for 4 days then increase to 75mg  twice daily for 4 days then increase to 75mg  three times daily. We will do prior authorization for this.  Trial amitriptyline 25mg  1/2-1 tablet nightly for mood and pain.  I will refer you to [facility] for mammogram (local).  UDS today.  Return in 3 months for follow up visit.

## 2016-03-28 DIAGNOSIS — Z8619 Personal history of other infectious and parasitic diseases: Secondary | ICD-10-CM | POA: Insufficient documentation

## 2016-03-28 DIAGNOSIS — B009 Herpesviral infection, unspecified: Secondary | ICD-10-CM | POA: Insufficient documentation

## 2016-03-28 DIAGNOSIS — Z599 Problem related to housing and economic circumstances, unspecified: Secondary | ICD-10-CM | POA: Insufficient documentation
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
## 2016-03-28 NOTE — Assessment & Plan Note (Addendum)
Reviewed current pain regimen - percocet 7.5/325mg  TID #90 per month, diclofenac + cytotec, robaxin.  [place] CSRS reviewed and appropriate.  Discussed addition of lyrica 75mg  with slow titration, will also add amitriptyline 25mg  nightly  RTC 3 mo f/u visit CPE

## 2016-03-28 NOTE — Assessment & Plan Note (Signed)
Currently living in boarding house.  PHQ9 = 21 without SI/HI.  Pt endorses situational mood disorder from ongoing pain and social stress.

## 2016-03-28 NOTE — Assessment & Plan Note (Signed)
Check uric acid. Ongoing point tenderness at 1st MTP s/p steroid injection by podiatry. Most recently started on prednisone course by rheum. Advised f/u with podiatry if no improvement. Kaylee [name]

## 2016-03-28 NOTE — Assessment & Plan Note (Signed)
Rx valtrex PRN outbreak.

## 2016-03-28 NOTE — Progress Notes (Signed)
I reviewed health advisor's note, was available for consultation, and agree with documentation and plan.  

## 2016-03-28 NOTE — Assessment & Plan Note (Signed)
Appreciate rheum care. On plaquenil. Recently started on short prednisone course.

## 2016-03-28 NOTE — Assessment & Plan Note (Signed)
Update UDS today. If normal, consider spacing out (would be 3rd normal UDS).

## 2016-03-28 NOTE — Assessment & Plan Note (Addendum)
Long term valium/xanax. Failed SSRI/cymbalta. Will trial amitriptyline nightly.

## 2016-03-30 ENCOUNTER — Encounter: Payer: Self-pay | Admitting: *Deleted

## 2016-03-30 NOTE — Addendum Note (Signed)
Addended by: Roena Malady on: 03/30/2016 04:44 PM   Modules accepted: Orders

## 2016-04-03 ENCOUNTER — Other Ambulatory Visit: Payer: Self-pay | Admitting: *Deleted

## 2016-04-03 NOTE — Telephone Encounter (Signed)
Per pharmacy, patient would like daily dose to prevent flares.

## 2016-04-04 ENCOUNTER — Encounter: Payer: Self-pay | Admitting: Family Medicine

## 2016-04-04 MED ORDER — ACYCLOVIR 400 MG PO TABS: 400.0000 mg | ORAL_TABLET | Freq: Two times a day (BID) | ORAL | 6 refills | Status: DC

## 2016-04-04 NOTE — Telephone Encounter (Signed)
Sent in

## 2016-04-17 ENCOUNTER — Telehealth: Payer: Self-pay | Admitting: Family Medicine
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
## 2016-04-20 ENCOUNTER — Other Ambulatory Visit: Payer: Self-pay | Admitting: *Deleted

## 2016-04-20 MED ORDER — OXYCODONE-ACETAMINOPHEN 7.5-325 MG PO TABS: ORAL_TABLET | ORAL | 0 refills | Status: DC

## 2016-04-20 MED ORDER — DIAZEPAM 10 MG PO TABS: 10.0000 mg | ORAL_TABLET | Freq: Two times a day (BID) | ORAL | 0 refills | Status: DC

## 2016-04-20 MED ORDER — ALPRAZOLAM 1 MG PO TABS: 1.0000 mg | ORAL_TABLET | Freq: Two times a day (BID) | ORAL | 0 refills | Status: DC

## 2016-04-20 NOTE — Telephone Encounter (Signed)
I recommend she discuss this with Dr Gavin Potters at her next rheum appointment, or she could schedule appt here to discuss referral vs other options.

## 2016-04-20 NOTE — Telephone Encounter (Signed)
Patient notified and appt scheduled.

## 2016-04-20 NOTE — Telephone Encounter (Signed)
Printed and in [facility] box. 

## 2016-04-20 NOTE — Telephone Encounter (Signed)
Ok to refill? All written on 03/19/16 to fill 03/29/16. Appt tomorrow.

## 2016-04-21 ENCOUNTER — Ambulatory Visit: Payer: Medicare Other | Admitting: Family Medicine

## 2016-04-23 NOTE — Telephone Encounter (Signed)
Patient notified and Rx's placed up front for pick up. 

## 2016-04-28 ENCOUNTER — Ambulatory Visit: Payer: Medicare Other

## 2016-04-28 ENCOUNTER — Other Ambulatory Visit: Payer: Self-pay | Admitting: Internal Medicine

## 2016-04-28 DIAGNOSIS — J42 Unspecified chronic bronchitis: Secondary | ICD-10-CM

## 2016-04-29 ENCOUNTER — Ambulatory Visit
Admission: RE | Admit: 2016-04-29 | Discharge: 2016-04-29 | Disposition: A | Discharge status: Home / Self Care | Payer: Medicare Other | Source: Ambulatory Visit | Attending: Family Medicine | Admitting: Family Medicine

## 2016-04-29 ENCOUNTER — Other Ambulatory Visit: Payer: Self-pay | Admitting: Family Medicine

## 2016-04-29 DIAGNOSIS — Z1239 Encounter for other screening for malignant neoplasm of breast: Secondary | ICD-10-CM

## 2016-04-29 DIAGNOSIS — Z1231 Encounter for screening mammogram for malignant neoplasm of breast: Secondary | ICD-10-CM | POA: Diagnosis not present

## 2016-05-01 ENCOUNTER — Encounter: Payer: Self-pay | Admitting: *Deleted

## 2016-05-01 LAB — HM MAMMOGRAPHY

## 2016-05-12 ENCOUNTER — Other Ambulatory Visit: Payer: Self-pay | Admitting: *Deleted

## 2016-05-12 MED ORDER — PREGABALIN 75 MG PO CAPS: 75.0000 mg | ORAL_CAPSULE | Freq: Three times a day (TID) | ORAL | 0 refills | Status: DC

## 2016-05-12 MED ORDER — ALPRAZOLAM 1 MG PO TABS: 1.0000 mg | ORAL_TABLET | Freq: Two times a day (BID) | ORAL | 0 refills | Status: DC

## 2016-05-12 MED ORDER — DIAZEPAM 10 MG PO TABS: 10.0000 mg | ORAL_TABLET | Freq: Two times a day (BID) | ORAL | 0 refills | Status: DC

## 2016-05-12 MED ORDER — OXYCODONE-ACETAMINOPHEN 7.5-325 MG PO TABS: ORAL_TABLET | ORAL | 0 refills | Status: DC

## 2016-05-12 NOTE — Telephone Encounter (Signed)
Printed and in Kim's box 

## 2016-05-12 NOTE — Telephone Encounter (Signed)
Ok to refill? All written on 04/20/16 to fill 04/28/16 with the exception of Lyrica which was written on 03/27/16 #90 1RF.

## 2016-05-13 NOTE — Telephone Encounter (Signed)
Patient notified and Rx's placed up front for pick up. 

## 2016-05-21 ENCOUNTER — Other Ambulatory Visit: Payer: Self-pay | Admitting: *Deleted

## 2016-05-21 MED ORDER — OMEGA-3-ACID ETHYL ESTERS 1 G PO CAPS: ORAL_CAPSULE | ORAL | 6 refills | Status: DC

## 2016-05-26 ENCOUNTER — Telehealth: Payer: Self-pay | Admitting: Cardiovascular Disease

## 2016-05-26 NOTE — Telephone Encounter (Signed)
Attempted to schedule fu from recall list.  Patient not interested in fu and says she is doing well. Deleting recall

## 2016-05-29 ENCOUNTER — Other Ambulatory Visit: Payer: Self-pay | Admitting: *Deleted

## 2016-05-29 ENCOUNTER — Other Ambulatory Visit: Payer: Self-pay | Admitting: Internal Medicine

## 2016-05-29 NOTE — Telephone Encounter (Signed)
Ok to refill? Bentyl last filled 12/13/15 #60 3RF; Robaxin 09/23/15 #60 6RF

## 2016-05-30 MED ORDER — METHOCARBAMOL 500 MG PO TABS: ORAL_TABLET | ORAL | 6 refills | Status: DC

## 2016-05-30 MED ORDER — DICYCLOMINE HCL 10 MG PO CAPS: 10.0000 mg | ORAL_CAPSULE | Freq: Three times a day (TID) | ORAL | 3 refills | Status: DC | PRN

## 2016-06-08 ENCOUNTER — Ambulatory Visit: Payer: Medicare Other | Admitting: Podiatry

## 2016-06-23 ENCOUNTER — Other Ambulatory Visit: Payer: Self-pay | Admitting: *Deleted

## 2016-06-23 NOTE — Telephone Encounter (Signed)
OK to refill in Dr. Timoteo Expose absence? All filled 05/12/16 Diazepam #60 0RF; Alprazolam #60 0RF and Lyrica #90 0RF. Please send back to me. Thanks!

## 2016-06-24 NOTE — Telephone Encounter (Signed)
Disregard previous message, these are due, but would prefer if we wait for PCP. He should be back tomorrow.

## 2016-06-24 NOTE — Telephone Encounter (Signed)
These are not due until 07/12/16, can wait for PCP return.

## 2016-06-25 MED ORDER — DIAZEPAM 10 MG PO TABS: 10.0000 mg | ORAL_TABLET | Freq: Two times a day (BID) | ORAL | 0 refills | Status: DC

## 2016-06-25 MED ORDER — ALPRAZOLAM 1 MG PO TABS: 1.0000 mg | ORAL_TABLET | Freq: Two times a day (BID) | ORAL | 0 refills | Status: DC

## 2016-06-25 MED ORDER — OXYCODONE-ACETAMINOPHEN 7.5-325 MG PO TABS: ORAL_TABLET | ORAL | 0 refills | Status: DC

## 2016-06-25 NOTE — Addendum Note (Signed)
Addended by: [name] on: 06/25/2016 09:23 AM   Modules accepted: Orders

## 2016-06-25 NOTE — Telephone Encounter (Signed)
Printed and in Kim's box 

## 2016-06-25 NOTE — Telephone Encounter (Signed)
Patient notified and said she will pick up at her appt tomorrow.

## 2016-06-26 ENCOUNTER — Ambulatory Visit (INDEPENDENT_AMBULATORY_CARE_PROVIDER_SITE_OTHER): Payer: Medicare Other | Admitting: Family Medicine

## 2016-06-26 ENCOUNTER — Encounter: Payer: Self-pay | Admitting: Family Medicine

## 2016-06-26 VITALS — BP 112/74 | HR 76 | Temp 98.6°F | Wt 197.0 lb

## 2016-06-26 DIAGNOSIS — Z0000 Encounter for general adult medical examination without abnormal findings: Secondary | ICD-10-CM | POA: Diagnosis not present

## 2016-06-26 DIAGNOSIS — Z599 Problem related to housing and economic circumstances, unspecified: Secondary | ICD-10-CM

## 2016-06-26 DIAGNOSIS — F411 Generalized anxiety disorder: Secondary | ICD-10-CM

## 2016-06-26 DIAGNOSIS — Z7189 Other specified counseling: Secondary | ICD-10-CM | POA: Diagnosis not present

## 2016-06-26 DIAGNOSIS — M0600 Rheumatoid arthritis without rheumatoid factor, unspecified site: Secondary | ICD-10-CM

## 2016-06-26 DIAGNOSIS — G8929 Other chronic pain: Secondary | ICD-10-CM

## 2016-06-26 DIAGNOSIS — G894 Chronic pain syndrome: Secondary | ICD-10-CM

## 2016-06-26 MED ORDER — AMITRIPTYLINE HCL 25 MG PO TABS: 25.0000 mg | ORAL_TABLET | Freq: Every day | ORAL | 3 refills | Status: DC

## 2016-06-26 MED ORDER — PREGABALIN 150 MG PO CAPS: 150.0000 mg | ORAL_CAPSULE | Freq: Two times a day (BID) | ORAL | 11 refills | Status: DC

## 2016-06-26 NOTE — Assessment & Plan Note (Addendum)
Severe anxiety - stable on valium/xanax. Will need to discuss sustainability of double benzo regimen. Failed SSRI and cymbalta.

## 2016-06-26 NOTE — Progress Notes (Signed)
Pre visit review using our clinic review tool, if applicable. No additional management support is needed unless otherwise documented below in the visit note. 

## 2016-06-26 NOTE — Assessment & Plan Note (Addendum)
Advanced directives - wants best friend to be HCPOA. Wants CPR and would want trial of life support. Requests new packet to update with changes.

## 2016-06-26 NOTE — Assessment & Plan Note (Signed)
Off MTX, on plaquenil. Advised I don't Rx plaquenil, she will continue rheum f/u.

## 2016-06-26 NOTE — Assessment & Plan Note (Addendum)
Significant improvement on lyrica and amitriptyline. Continue this along with oxycodone - discussed slow titration of oxycodone. Increase lyrica to 150mg  BID.
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
## 2016-06-26 NOTE — Assessment & Plan Note (Signed)
Preventative protocols reviewed and updated unless pt declined. Discussed healthy diet and lifestyle.  

## 2016-06-26 NOTE — Patient Instructions (Addendum)
I'm glad you are doing well today!  Continue current medicines, let's try the higher lyrica dose.  Try to decrease percocet use over the next several months.  Return as needed or in 3-4 months for follow up visit.  Advanced directive packet provided today.   Health Maintenance, Female Introduction Adopting a healthy lifestyle and getting preventive care can go a long way to promote health and wellness. Talk with your health care provider about what schedule of regular examinations is right for you. This is a good chance for you to check in with your provider about disease prevention and staying healthy. In between checkups, there are plenty of things you can do on your own. Experts have done a lot of research about which lifestyle changes and preventive measures are most likely to keep you healthy. Ask your health care provider for more information. Weight and diet Eat a healthy diet  Be sure to include plenty of vegetables, fruits, low-fat dairy products, and lean protein.  Do not eat a lot of foods high in solid fats, added sugars, or salt.  Get regular exercise. This is one of the most important things you can do for your health.  Most adults should exercise for at least 150 minutes each week. The exercise should increase your heart rate and make you sweat (moderate-intensity exercise).  Most adults should also do strengthening exercises at least twice a week. This is in addition to the moderate-intensity exercise. Maintain a healthy weight  Body mass index (BMI) is a measurement that can be used to identify possible weight problems. It estimates body fat based on height and weight. Your health care provider can help determine your BMI and help you achieve or maintain a healthy weight.  For females 57 years of age and older:  A BMI below 18.5 is considered underweight.  A BMI of 18.5 to 24.9 is normal.  A BMI of 25 to 29.9 is considered overweight.  A BMI of 30 and above is  considered obese. Watch levels of cholesterol and blood lipids  You should start having your blood tested for lipids and cholesterol at 56 years of age, then have this test every 5 years.  You may need to have your cholesterol levels checked more often if:  Your lipid or cholesterol levels are high.  You are older than 56 years of age.  You are at high risk for heart disease. Cancer screening Lung Cancer  Lung cancer screening is recommended for adults 48-48 years old who are at high risk for lung cancer because of a history of smoking.  A yearly low-dose CT scan of the lungs is recommended for people who:  Currently smoke.  Have quit within the past 15 years.  Have at least a 30-pack-year history of smoking. A pack year is smoking an average of one pack of cigarettes a day for 1 year.  Yearly screening should continue until it has been 15 years since you quit.  Yearly screening should stop if you develop a health problem that would prevent you from having lung cancer treatment. Breast Cancer  Practice breast self-awareness. This means understanding how your breasts normally appear and feel.  It also means doing regular breast self-exams. Let your health care provider know about any changes, no matter how small.  If you are in your 20s or 30s, you should have a clinical breast exam (CBE) by a health care provider every 1-3 years as part of a regular health exam.  If  you are 40 or older, have a CBE every year. Also consider having a breast X-ray (mammogram) every year.  If you have a family history of breast cancer, talk to your health care provider about genetic screening.  If you are at high risk for breast cancer, talk to your health care provider about having an MRI and a mammogram every year.  Breast cancer gene (BRCA) assessment is recommended for women who have family members with BRCA-related cancers. BRCA-related cancers  include:  Breast.  Ovarian.  Tubal.  Peritoneal cancers.  Results of the assessment will determine the need for genetic counseling and BRCA1 and BRCA2 testing. Cervical Cancer  Your health care provider may recommend that you be screened regularly for cancer of the pelvic organs (ovaries, uterus, and vagina). This screening involves a pelvic examination, including checking for microscopic changes to the surface of your cervix (Pap test). You may be encouraged to have this screening done every 3 years, beginning at age 57.  For women ages 7-65, health care providers may recommend pelvic exams and Pap testing every 3 years, or they may recommend the Pap and pelvic exam, combined with testing for human papilloma virus (HPV), every 5 years. Some types of HPV increase your risk of cervical cancer. Testing for HPV may also be done on women of any age with unclear Pap test results.  Other health care providers may not recommend any screening for nonpregnant women who are considered low risk for pelvic cancer and who do not have symptoms. Ask your health care provider if a screening pelvic exam is right for you.  If you have had past treatment for cervical cancer or a condition that could lead to cancer, you need Pap tests and screening for cancer for at least 20 years after your treatment. If Pap tests have been discontinued, your risk factors (such as having a new sexual partner) need to be reassessed to determine if screening should resume. Some women have medical problems that increase the chance of getting cervical cancer. In these cases, your health care provider may recommend more frequent screening and Pap tests. Colorectal Cancer  This type of cancer can be detected and often prevented.  Routine colorectal cancer screening usually begins at 56 years of age and continues through 56 years of age.  Your health care provider may recommend screening at an earlier age if you have risk factors  for colon cancer.  Your health care provider may also recommend using home test kits to check for hidden blood in the stool.  A small camera at the end of a tube can be used to examine your colon directly (sigmoidoscopy or colonoscopy). This is done to check for the earliest forms of colorectal cancer.  Routine screening usually begins at age 13.  Direct examination of the colon should be repeated every 5-10 years through 56 years of age. However, you may need to be screened more often if early forms of precancerous polyps or small growths are found. Skin Cancer  Check your skin from head to toe regularly.  Tell your health care provider about any new moles or changes in moles, especially if there is a change in a mole's shape or color.  Also tell your health care provider if you have a mole that is larger than the size of a pencil eraser.  Always use sunscreen. Apply sunscreen liberally and repeatedly throughout the day.  Protect yourself by wearing long sleeves, pants, a wide-brimmed hat, and sunglasses whenever you  are outside. Heart disease, diabetes, and high blood pressure  High blood pressure causes heart disease and increases the risk of stroke. High blood pressure is more likely to develop in:  People who have blood pressure in the high end of the normal range (130-139/85-89 mm Hg).  People who are overweight or obese.  People who are African American.  If you are 62-43 years of age, have your blood pressure checked every 3-5 years. If you are 59 years of age or older, have your blood pressure checked every year. You should have your blood pressure measured twice-once when you are at a hospital or clinic, and once when you are not at a hospital or clinic. Record the average of the two measurements. To check your blood pressure when you are not at a hospital or clinic, you can use:  An automated blood pressure machine at a pharmacy.  A home blood pressure monitor.  If you  are between 87 years and 42 years old, ask your health care provider if you should take aspirin to prevent strokes.  Have regular diabetes screenings. This involves taking a blood sample to check your fasting blood sugar level.  If you are at a normal weight and have a low risk for diabetes, have this test once every three years after 56 years of age.  If you are overweight and have a high risk for diabetes, consider being tested at a younger age or more often. Preventing infection Hepatitis B  If you have a higher risk for hepatitis B, you should be screened for this virus. You are considered at high risk for hepatitis B if:  You were born in a country where hepatitis B is common. Ask your health care provider which countries are considered high risk.  Your parents were born in a high-risk country, and you have not been immunized against hepatitis B (hepatitis B vaccine).  You have HIV or AIDS.  You use needles to inject street drugs.  You live with someone who has hepatitis B.  You have had sex with someone who has hepatitis B.  You get hemodialysis treatment.  You take certain medicines for conditions, including cancer, organ transplantation, and autoimmune conditions. Hepatitis C  Blood testing is recommended for:  Everyone born from [birth year] through 1966.  Anyone with known risk factors for hepatitis C. Sexually transmitted infections (STIs)  You should be screened for sexually transmitted infections (STIs) including gonorrhea and chlamydia if:  You are sexually active and are younger than 56 years of age.  You are older than 56 years of age and your health care provider tells you that you are at risk for this type of infection.  Your sexual activity has changed since you were last screened and you are at an increased risk for chlamydia or gonorrhea. Ask your health care provider if you are at risk.  If you do not have HIV, but are at risk, it may be recommended that you  take a prescription medicine daily to prevent HIV infection. This is called pre-exposure prophylaxis (PrEP). You are considered at risk if:  You are sexually active and do not regularly use condoms or know the HIV status of your partner(s).  You take drugs by injection.  You are sexually active with a partner who has HIV. Talk with your health care provider about whether you are at high risk of being infected with HIV. If you choose to begin PrEP, you should first be tested for HIV. You  should then be tested every 3 months for as long as you are taking PrEP. Pregnancy  If you are premenopausal and you may become pregnant, ask your health care provider about preconception counseling.  If you may become pregnant, take 400 to 800 micrograms (mcg) of folic acid every day.  If you want to prevent pregnancy, talk to your health care provider about birth control (contraception). Osteoporosis and menopause  Osteoporosis is a disease in which the bones lose minerals and strength with aging. This can result in serious bone fractures. Your risk for osteoporosis can be identified using a bone density scan.  If you are 63 years of age or older, or if you are at risk for osteoporosis and fractures, ask your health care provider if you should be screened.  Ask your health care provider whether you should take a calcium or vitamin D supplement to lower your risk for osteoporosis.  Menopause may have certain physical symptoms and risks.  Hormone replacement therapy may reduce some of these symptoms and risks. Talk to your health care provider about whether hormone replacement therapy is right for you. Follow these instructions at home:  Schedule regular health, dental, and eye exams.  Stay current with your immunizations.  Do not use any tobacco products including cigarettes, chewing tobacco, or electronic cigarettes.  If you are pregnant, do not drink alcohol.  If you are breastfeeding, limit  how much and how often you drink alcohol.  Limit alcohol intake to no more than 1 drink per day for nonpregnant women. One drink equals 12 ounces of beer, 5 ounces of wine, or 1 ounces of hard liquor.  Do not use street drugs.  Do not share needles.  Ask your health care provider for help if you need support or information about quitting drugs.  Tell your health care provider if you often feel depressed.  Tell your health care provider if you have ever been abused or do not feel safe at home. This information is not intended to replace advice given to you by your health care provider. Make sure you discuss any questions you have with your health care provider. Document Released: 01/12/2011 Document Revised: 12/05/2015 Document Reviewed: 04/02/2015  2017 [employer]vier

## 2016-06-26 NOTE — Assessment & Plan Note (Signed)
Living situation has improved - [place] Happy with current living situation. This has likely helped anxiety, pain, stress.

## 2016-06-29 ENCOUNTER — Other Ambulatory Visit: Payer: Self-pay

## 2016-06-29 MED ORDER — NITROFURANTOIN MACROCRYSTAL 100 MG PO CAPS: ORAL_CAPSULE | ORAL | 3 refills | Status: DC

## 2016-06-29 NOTE — Telephone Encounter (Signed)
Pt left v/m requesting refill nitrofurantoin. Last refilled # 90 x 3 on 05/31/15. Pt last seen 06/26/[facility].

## 2016-07-12 DIAGNOSIS — K121 Other forms of stomatitis: Secondary | ICD-10-CM | POA: Diagnosis not present

## 2016-07-12 DIAGNOSIS — K1370 Unspecified lesions of oral mucosa: Secondary | ICD-10-CM | POA: Diagnosis not present

## 2016-07-12 DIAGNOSIS — R062 Wheezing: Secondary | ICD-10-CM | POA: Diagnosis not present

## 2016-07-12 DIAGNOSIS — I252 Old myocardial infarction: Secondary | ICD-10-CM | POA: Diagnosis not present

## 2016-07-12 DIAGNOSIS — I10 Essential (primary) hypertension: Secondary | ICD-10-CM | POA: Diagnosis not present

## 2016-07-12 DIAGNOSIS — Z87891 Personal history of nicotine dependence: Secondary | ICD-10-CM | POA: Diagnosis not present

## 2016-07-12 DIAGNOSIS — R200 Anesthesia of skin: Secondary | ICD-10-CM | POA: Diagnosis not present

## 2016-07-12 DIAGNOSIS — R55 Syncope and collapse: Secondary | ICD-10-CM | POA: Diagnosis not present

## 2016-07-14 ENCOUNTER — Telehealth: Payer: Self-pay | Admitting: Family Medicine

## 2016-07-14 NOTE — Telephone Encounter (Signed)
Pt dropped off 10 page ppw from [facility] visit 07/12/2016 for Dr. Nicanor Alcon review.  Placed ppw in Dr. Reece Agar inbox along with handwritten note from patient.
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
## 2016-07-17 NOTE — Telephone Encounter (Signed)
I was able to view the records in Care Everywhere. In your IN box.

## 2016-07-17 NOTE — Telephone Encounter (Signed)
Can we request records from [facility]?

## 2016-07-22 ENCOUNTER — Other Ambulatory Visit: Payer: Self-pay
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
## 2016-07-23 ENCOUNTER — Other Ambulatory Visit: Payer: Self-pay | Admitting: *Deleted

## 2016-07-23 MED ORDER — ALPRAZOLAM 1 MG PO TABS: 1.0000 mg | ORAL_TABLET | Freq: Two times a day (BID) | ORAL | 0 refills | Status: DC

## 2016-07-23 MED ORDER — OXYCODONE-ACETAMINOPHEN 7.5-325 MG PO TABS: ORAL_TABLET | ORAL | 0 refills | Status: DC

## 2016-07-23 MED ORDER — DIAZEPAM 10 MG PO TABS
10.0000 mg | ORAL_TABLET | Freq: Two times a day (BID) | ORAL | 0 refills | Status: DC
Start: 2016-07-23 — End: 2016-08-25

## 2016-07-23 NOTE — Telephone Encounter (Signed)
Ok to refill? All written on 06/25/16 to be filled on 06/27/16.

## 2016-07-23 NOTE — Telephone Encounter (Signed)
Printed and in Kim's box 

## 2016-07-23 NOTE — Telephone Encounter (Signed)
Attempted to call patient. Mailbox full and could not leave message. Rx's placed up front for pick up.

## 2016-07-27 ENCOUNTER — Emergency Department
Admission: EM | Admit: 2016-07-27 | Discharge: 2016-07-27 | Disposition: A | Discharge status: Home / Self Care | Payer: Medicare Other | Attending: Emergency Medicine | Admitting: Emergency Medicine

## 2016-07-27 ENCOUNTER — Encounter: Payer: Self-pay | Admitting: *Deleted

## 2016-07-27 ENCOUNTER — Telehealth: Payer: Self-pay | Admitting: *Deleted

## 2016-07-27 DIAGNOSIS — Z791 Long term (current) use of non-steroidal anti-inflammatories (NSAID): Secondary | ICD-10-CM | POA: Diagnosis not present

## 2016-07-27 DIAGNOSIS — J45909 Unspecified asthma, uncomplicated: Secondary | ICD-10-CM | POA: Insufficient documentation

## 2016-07-27 DIAGNOSIS — G8929 Other chronic pain: Secondary | ICD-10-CM | POA: Diagnosis not present

## 2016-07-27 DIAGNOSIS — Y9389 Activity, other specified: Secondary | ICD-10-CM | POA: Diagnosis not present

## 2016-07-27 DIAGNOSIS — M545 Low back pain: Secondary | ICD-10-CM | POA: Insufficient documentation

## 2016-07-27 DIAGNOSIS — Z87891 Personal history of nicotine dependence: Secondary | ICD-10-CM | POA: Diagnosis not present

## 2016-07-27 DIAGNOSIS — Y92410 Unspecified street and highway as the place of occurrence of the external cause: Secondary | ICD-10-CM | POA: Insufficient documentation

## 2016-07-27 DIAGNOSIS — Z79899 Other long term (current) drug therapy: Secondary | ICD-10-CM | POA: Insufficient documentation

## 2016-07-27 DIAGNOSIS — S3992XA Unspecified injury of lower back, initial encounter: Secondary | ICD-10-CM | POA: Diagnosis present

## 2016-07-27 DIAGNOSIS — Y999 Unspecified external cause status: Secondary | ICD-10-CM | POA: Insufficient documentation

## 2016-07-27 MED ORDER — KETOROLAC TROMETHAMINE 60 MG/2ML IM SOLN
60.0000 mg | Freq: Once | INTRAMUSCULAR | Status: AC
Start: 2016-07-27 — End: 2016-07-27
  Administered 2016-07-27: 60 mg via INTRAMUSCULAR
  Filled 2016-07-27: qty 2

## 2016-07-27 MED ORDER — KETOROLAC TROMETHAMINE 60 MG/2ML IM SOLN
INTRAMUSCULAR | Status: AC
  Filled 2016-07-27: qty 2

## 2016-07-27 MED ORDER — ORPHENADRINE CITRATE 30 MG/ML IJ SOLN
60.0000 mg | Freq: Two times a day (BID) | INTRAMUSCULAR | Status: DC
  Administered 2016-07-27: 60 mg via INTRAMUSCULAR
  Filled 2016-07-27: qty 2

## 2016-07-27 MED ORDER — HYDROMORPHONE HCL 1 MG/ML IJ SOLN
1.0000 mg | Freq: Once | INTRAMUSCULAR | Status: AC
  Administered 2016-07-27: 1 mg via INTRAMUSCULAR
  Filled 2016-07-27: qty 1
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
## 2016-07-27 NOTE — ED Notes (Signed)
AAOx3.  Skin warm and dry. Ambulates with easy and steady gait. 
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
## 2016-07-28 ENCOUNTER — Telehealth: Payer: Self-pay | Admitting: *Deleted

## 2016-07-28 NOTE — Telephone Encounter (Signed)
Spoke with patient and advised to keep appt that is scheduled for Monday.

## 2016-07-28 NOTE — Telephone Encounter (Signed)
She last had lumbar spine xrays done 11/2014 in our system.  Anticipate worsening pain is from coming off lyrica.  Recommend OV eval prior to further testing.

## 2016-07-28 NOTE — Telephone Encounter (Signed)
Spoke with patient. She has been taking both bc you prescribed one and another dr prescribed the other and she didn't know she wasn't supposed to take both. I advised for her to choose one to continue and stop the other. She will continue the diclofenac and stop etodolac. (She thought etodolac and plaquenil were the same)

## 2016-07-28 NOTE — Telephone Encounter (Signed)
plz clafiry with patient - is she taking diclofenac 75mg  bid or etodolac 400mg  bid - same class of meds and she should only be on one.

## 2016-07-28 NOTE — Telephone Encounter (Signed)
Patient requesting refill on etodolac 400 1 PO BID. It doesn't appear that you have ever prescribed this. Not on med list at all. Please advise. Medical [employer].

## 2016-07-30 NOTE — Telephone Encounter (Signed)
This was refilled.

## 2016-08-03 ENCOUNTER — Encounter: Payer: Self-pay | Admitting: Family Medicine

## 2016-08-03 ENCOUNTER — Ambulatory Visit (INDEPENDENT_AMBULATORY_CARE_PROVIDER_SITE_OTHER)
Admission: RE | Admit: 2016-08-03 | Discharge: 2016-08-03 | Disposition: A | Discharge status: Home / Self Care | Payer: Medicare Other | Source: Ambulatory Visit | Attending: Family Medicine | Admitting: Family Medicine

## 2016-08-03 ENCOUNTER — Ambulatory Visit (INDEPENDENT_AMBULATORY_CARE_PROVIDER_SITE_OTHER): Payer: Medicare Other | Admitting: Family Medicine

## 2016-08-03 ENCOUNTER — Encounter (INDEPENDENT_AMBULATORY_CARE_PROVIDER_SITE_OTHER): Payer: Self-pay

## 2016-08-03 DIAGNOSIS — M25559 Pain in unspecified hip: Secondary | ICD-10-CM

## 2016-08-03 DIAGNOSIS — M5432 Sciatica, left side: Secondary | ICD-10-CM

## 2016-08-03 DIAGNOSIS — G894 Chronic pain syndrome: Secondary | ICD-10-CM | POA: Diagnosis not present

## 2016-08-03 DIAGNOSIS — Z599 Problem related to housing and economic circumstances, unspecified: Secondary | ICD-10-CM

## 2016-08-03 DIAGNOSIS — M0600 Rheumatoid arthritis without rheumatoid factor, unspecified site: Secondary | ICD-10-CM

## 2016-08-03 DIAGNOSIS — F411 Generalized anxiety disorder: Secondary | ICD-10-CM | POA: Diagnosis not present

## 2016-08-03 DIAGNOSIS — M533 Sacrococcygeal disorders, not elsewhere classified: Secondary | ICD-10-CM | POA: Diagnosis not present

## 2016-08-03 DIAGNOSIS — M545 Low back pain: Secondary | ICD-10-CM | POA: Diagnosis not present

## 2016-08-03 NOTE — Progress Notes (Signed)
Pre visit review using our clinic review tool, if applicable. No additional management support is needed unless otherwise documented below in the visit note. 
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
## 2016-08-03 NOTE — Patient Instructions (Addendum)
Xray today.  Pick up robaxin and diclofenac prescripition. Schedule follow up with Dr Gavin Potters.  Decrease xanax to one daily. Continue valium 10mg  twice daily. Continue amitriptyline 25mg  at bedtime.  Reschedule next visit for 6 wks.

## 2016-08-04 ENCOUNTER — Encounter: Payer: Self-pay | Admitting: Family Medicine

## 2016-08-04 DIAGNOSIS — M5432 Sciatica, left side: Secondary | ICD-10-CM | POA: Insufficient documentation

## 2016-08-04 NOTE — Assessment & Plan Note (Signed)
Update imaging.  
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
## 2016-08-04 NOTE — Assessment & Plan Note (Signed)
Has moved into house with daughter temporarily.

## 2016-08-04 NOTE — Assessment & Plan Note (Signed)
Now off lyrica. Continues oxycodone, back on diclofenac.
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
## 2016-08-04 NOTE — Assessment & Plan Note (Signed)
Encouraged f/u with rheum

## 2016-08-06 ENCOUNTER — Telehealth: Payer: Self-pay | Admitting: *Deleted
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
## 2016-08-12 ENCOUNTER — Other Ambulatory Visit: Payer: Self-pay

## 2016-08-12 ENCOUNTER — Telehealth: Payer: Self-pay | Admitting: Cardiovascular Disease

## 2016-08-12 ENCOUNTER — Telehealth: Payer: Self-pay
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
## 2016-08-12 NOTE — Telephone Encounter (Signed)
-----   Message from Festus Aloe, CMA sent at 08/12/2016  1:37 PM EST ----- Please contact patient for a follow up appt with Dr. Mariah Milling. The patient is requesting a refill and was to follow up in Aug. 2017.  Thanks, Jasmine December

## 2016-08-12 NOTE — Telephone Encounter (Signed)
Pt left v/m; pt does not want anything in place of the Elavil; pt was having nightmares,anxiety and panic attacks. Pt said she cannot take Elavil;pt read side effects of Elavil and thinks that is what is causing problems. pt stopped Elavil about 4 days ago and is feeling better. FYI to Dr Reece Agar.

## 2016-08-13 MED ORDER — ATORVASTATIN CALCIUM 10 MG PO TABS: 10.0000 mg | ORAL_TABLET | Freq: Every day | ORAL | 0 refills | Status: DC

## 2016-08-13 NOTE — Telephone Encounter (Signed)
Noted  

## 2016-08-13 NOTE — Telephone Encounter (Signed)
Patient notified need to make an appointment for anymore refills.  The patient was given atorvastatin this time but the patient understands and will need to make an appointment for further refills.

## 2016-08-25 ENCOUNTER — Other Ambulatory Visit: Payer: Self-pay

## 2016-08-25 NOTE — Telephone Encounter (Signed)
Pt request refill xanax (last refilled # 60 on 07/23/16) and valium ( last refilled # 60 on 07/23/16) and oxycodone apap (last printed # 90 on 07/23/16). Pt last seen 06/26/16 for annual exam.  Pt request to pick up on 08/31/16 and pt said she cannot get filled until 09/03/16 but she has transportation problems and that is the day she has transportation to come to [facility]. Pt request cb when ready for pick up.

## 2016-08-26 MED ORDER — ALPRAZOLAM 1 MG PO TABS: 1.0000 mg | ORAL_TABLET | Freq: Every day | ORAL | 0 refills | Status: DC

## 2016-08-26 MED ORDER — DIAZEPAM 10 MG PO TABS: 10.0000 mg | ORAL_TABLET | Freq: Two times a day (BID) | ORAL | 0 refills | Status: DC

## 2016-08-26 MED ORDER — OXYCODONE-ACETAMINOPHEN 7.5-325 MG PO TABS: ORAL_TABLET | ORAL | 0 refills | Status: DC

## 2016-08-26 NOTE — Telephone Encounter (Signed)
Patient notified and Rx's placed up front for pick up. 

## 2016-08-26 NOTE — Telephone Encounter (Signed)
Printed and in Kim's box 

## 2016-08-28 ENCOUNTER — Telehealth: Payer: Self-pay | Admitting: *Deleted

## 2016-08-28 NOTE — Telephone Encounter (Signed)
RF request for prednisone 5mg  dose pack prescribed last 9/17 by rheum for foot pain.

## 2016-08-28 NOTE — Telephone Encounter (Signed)
Pharmacy notified.

## 2016-08-28 NOTE — Telephone Encounter (Signed)
No - needs eval by rheum for this.

## 2016-08-31 ENCOUNTER — Telehealth: Payer: Self-pay | Admitting: Family Medicine

## 2016-08-31 DIAGNOSIS — M0609 Rheumatoid arthritis without rheumatoid factor, multiple sites: Secondary | ICD-10-CM | POA: Diagnosis not present

## 2016-08-31 DIAGNOSIS — G8929 Other chronic pain: Secondary | ICD-10-CM | POA: Diagnosis not present

## 2016-08-31 DIAGNOSIS — F411 Generalized anxiety disorder: Secondary | ICD-10-CM

## 2016-08-31 DIAGNOSIS — M545 Low back pain: Secondary | ICD-10-CM | POA: Diagnosis not present

## 2016-08-31 NOTE — Telephone Encounter (Signed)
Pt called to report on her appt with Dr. Gavin Potters today.  She said he diagnosed her with bipolar and manic.  She said she has had these issues before.  She said he was going to try and get in touch with you.  He was also going to speak with Dr. Reece Agar about leaving the nerve pills alone.  She needs this addressed by the 22nd as that is the only day she has a ride to the pharmacy.

## 2016-08-31 NOTE — Telephone Encounter (Signed)
Kaylee [name] with [facility] calls to inform Kaylee [name] that patient came by looking for an "r/x for her bipolar" today.  Pharmacy asks for follow up if a medication will be sent in or if patient needs to be seen first.  Please Advise.

## 2016-09-01 NOTE — Telephone Encounter (Signed)
Recommend eval in office before starting any new medication.  If bipolar disorder concern, recommend psychiatry evaluation - would offer psych referral if she'd rather start with this that come into office.

## 2016-09-02 NOTE — Telephone Encounter (Signed)
Patient notified and verbalized understanding. Patient will call back to schedule an appointment when able to get a ride to office.

## 2016-09-03 NOTE — Telephone Encounter (Signed)
Pt called Kaylee [name], requests psych referral rather than coming in. Placed.

## 2016-09-03 NOTE — Addendum Note (Signed)
Addended by: [name] on: 09/03/2016 03:02 PM   Modules accepted: Orders

## 2016-09-07 NOTE — Telephone Encounter (Signed)
Spoke with patient, she wants referral to [facility]. Told patient to expect a call from them to set her up with Psychiatry. Sent referral internally to their workque.

## 2016-09-21 ENCOUNTER — Other Ambulatory Visit: Payer: Self-pay | Admitting: Family Medicine

## 2016-09-21 DIAGNOSIS — Z79899 Other long term (current) drug therapy: Secondary | ICD-10-CM | POA: Diagnosis not present

## 2016-09-22 NOTE — Telephone Encounter (Signed)
Ok to refill? Last written 08/26/16 to fill on 08/27/16 Oxy #90, Diazepam #60 Alprazolam #30 0RF

## 2016-09-23 ENCOUNTER — Other Ambulatory Visit: Payer: Self-pay | Admitting: Family Medicine
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
## 2016-09-23 NOTE — Telephone Encounter (Signed)
printed and in Kim's box. 

## 2016-09-25 ENCOUNTER — Ambulatory Visit: Payer: Medicare Other | Admitting: Family Medicine

## 2016-09-28 ENCOUNTER — Ambulatory Visit (INDEPENDENT_AMBULATORY_CARE_PROVIDER_SITE_OTHER): Payer: Medicare Other | Admitting: Family Medicine

## 2016-09-28 ENCOUNTER — Encounter: Payer: Self-pay | Admitting: Family Medicine

## 2016-09-28 ENCOUNTER — Encounter: Payer: Self-pay | Admitting: *Deleted

## 2016-09-28 VITALS — BP 118/78 | HR 88 | Temp 98.1°F | Wt 203.2 lb

## 2016-09-28 DIAGNOSIS — F39 Unspecified mood [affective] disorder: Secondary | ICD-10-CM

## 2016-09-28 DIAGNOSIS — J019 Acute sinusitis, unspecified: Secondary | ICD-10-CM | POA: Diagnosis not present

## 2016-09-28 DIAGNOSIS — J40 Bronchitis, not specified as acute or chronic: Secondary | ICD-10-CM | POA: Diagnosis not present

## 2016-09-28 DIAGNOSIS — G8929 Other chronic pain: Secondary | ICD-10-CM

## 2016-09-28 DIAGNOSIS — Z599 Problem related to housing and economic circumstances, unspecified: Secondary | ICD-10-CM

## 2016-09-28 DIAGNOSIS — J329 Chronic sinusitis, unspecified: Secondary | ICD-10-CM | POA: Diagnosis not present

## 2016-09-28 DIAGNOSIS — K219 Gastro-esophageal reflux disease without esophagitis: Secondary | ICD-10-CM

## 2016-09-28 DIAGNOSIS — F411 Generalized anxiety disorder: Secondary | ICD-10-CM | POA: Diagnosis not present

## 2016-09-28 DIAGNOSIS — N3020 Other chronic cystitis without hematuria: Secondary | ICD-10-CM | POA: Diagnosis not present

## 2016-09-28 DIAGNOSIS — M0600 Rheumatoid arthritis without rheumatoid factor, unspecified site: Secondary | ICD-10-CM | POA: Diagnosis not present

## 2016-09-28 MED ORDER — TIOTROPIUM BROMIDE MONOHYDRATE 18 MCG IN CAPS: 18.0000 ug | ORAL_CAPSULE | Freq: Every day | RESPIRATORY_TRACT | 3 refills | Status: DC

## 2016-09-28 MED ORDER — ESOMEPRAZOLE MAGNESIUM 40 MG PO CPDR: DELAYED_RELEASE_CAPSULE | ORAL | 1 refills | Status: DC

## 2016-09-28 MED ORDER — FLUTICASONE PROPIONATE 50 MCG/ACT NA SUSP: 2.0000 | Freq: Every day | NASAL | 15 refills | Status: DC

## 2016-09-28 MED ORDER — FLUTICASONE-SALMETEROL 250-50 MCG/DOSE IN AEPB: INHALATION_SPRAY | RESPIRATORY_TRACT | 11 refills | Status: DC

## 2016-09-28 MED ORDER — CETIRIZINE HCL 10 MG PO TABS: 10.0000 mg | ORAL_TABLET | Freq: Every day | ORAL | 15 refills | Status: DC

## 2016-09-28 MED ORDER — AMOXICILLIN-POT CLAVULANATE 875-125 MG PO TABS: 1.0000 | ORAL_TABLET | Freq: Two times a day (BID) | ORAL | 0 refills | Status: AC

## 2016-09-28 MED ORDER — METHOCARBAMOL 500 MG PO TABS: ORAL_TABLET | ORAL | 6 refills | Status: DC

## 2016-09-28 MED ORDER — ACYCLOVIR 400 MG PO TABS: 400.0000 mg | ORAL_TABLET | Freq: Two times a day (BID) | ORAL | 6 refills | Status: DC

## 2016-09-28 NOTE — Progress Notes (Signed)
Pre visit review using our clinic review tool, if applicable. No additional management support is needed unless otherwise documented below in the visit note. 
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
## 2016-09-28 NOTE — Patient Instructions (Addendum)
Update UDS today.  Pass by our referral coordinator for psychiatry referral in [place].  You have a sinus infection. Take medicine as prescribed: augmentin 10 day course.  Push fluids and plenty of rest. Nasal saline irrigation or neti pot to help drain sinuses. May use plain mucinex with plenty of fluid to help mobilize mucous. Please let us know if fever >101.5, trouble opening/closing mouth, difficulty swallowing, or worsening instead of improving as expected.

## 2016-09-29 ENCOUNTER — Other Ambulatory Visit: Payer: Self-pay

## 2016-09-29 ENCOUNTER — Telehealth: Payer: Self-pay | Admitting: Family Medicine

## 2016-09-29 DIAGNOSIS — Z79891 Long term (current) use of opiate analgesic: Secondary | ICD-10-CM | POA: Diagnosis not present

## 2016-09-29 DIAGNOSIS — Z79899 Other long term (current) drug therapy: Secondary | ICD-10-CM | POA: Diagnosis not present

## 2016-09-29 NOTE — Telephone Encounter (Signed)
Pt left v/m; pt was seen 09/28/16 and given abx; pt forgot to ask for Diflucan to [facility] [employer]. Pt said after takes abx always gets yeast infection.Please advise.

## 2016-09-29 NOTE — Telephone Encounter (Signed)
Pt left v/m; pt was seen 09/28/16; pt was to have a psychiatric referral; pt wants referral to be done and 2 office notes and demographic sheet faxed to [phone]; this is [facility] in [place]; phone # is [phone]. Pt request cb when done.

## 2016-09-30 DIAGNOSIS — F319 Bipolar disorder, unspecified: Secondary | ICD-10-CM | POA: Insufficient documentation

## 2016-09-30 DIAGNOSIS — J019 Acute sinusitis, unspecified: Secondary | ICD-10-CM | POA: Insufficient documentation

## 2016-09-30 NOTE — Assessment & Plan Note (Signed)
Continues macrobid 10mg  daily ppx dosing.

## 2016-09-30 NOTE — Assessment & Plan Note (Signed)
Update UDS. Continues oxycodone 7.5/325mg  #90/month.

## 2016-09-30 NOTE — Telephone Encounter (Signed)
Ok to do - notes in chart.  May send latest rheum note as well.

## 2016-09-30 NOTE — Assessment & Plan Note (Signed)
Appreciate rheum care. On plaquenil. Started on sundilac and has been able to taper off prednisone.

## 2016-09-30 NOTE — Assessment & Plan Note (Signed)
Continues living with daughter (since 07/2016).

## 2016-09-30 NOTE — Assessment & Plan Note (Addendum)
Pt today endorses she has history of bipolar. Psychiatry referral initiated by rheum due to concern for manic episode at their latest visit. Pt agrees to referral, desires local referral to [place] psychiatrist - placed today.

## 2016-09-30 NOTE — Assessment & Plan Note (Signed)
Patient states protonix leads to breakthrough symptoms. Requests refill of nexium which is more effective.

## 2016-09-30 NOTE — Assessment & Plan Note (Signed)
Longstanding, severe, presented with ER visits for chest pain attributed to anxiety, managed with valium/xanax. Tried and failed SSRI and cymbalta. Stopped amitriptyline, unclear why. Last visit we decreased xanax to once daily, continued valium 10mg  BID in hopes of coming off xanax.

## 2016-09-30 NOTE — Assessment & Plan Note (Signed)
Anticipate acute bacterial sinusitis given duration of symptoms. Treat with augmentin course. Further supportive care as per instructions.

## 2016-10-01 MED ORDER — FLUCONAZOLE 150 MG PO TABS: 150.0000 mg | ORAL_TABLET | Freq: Once | ORAL | 0 refills | Status: AC

## 2016-10-01 NOTE — Telephone Encounter (Signed)
Spoke with Nedra Hai at [facility] faxed referral to her. She will call the patient to schedule.

## 2016-10-01 NOTE — Telephone Encounter (Signed)
Sent in

## 2016-10-20 ENCOUNTER — Other Ambulatory Visit: Payer: Self-pay | Admitting: *Deleted

## 2016-10-20 ENCOUNTER — Other Ambulatory Visit: Payer: Self-pay | Admitting: Family Medicine

## 2016-10-20 MED ORDER — FUROSEMIDE 20 MG PO TABS: ORAL_TABLET | ORAL | 3 refills | Status: DC

## 2016-10-20 MED ORDER — OXYCODONE-ACETAMINOPHEN 7.5-325 MG PO TABS: ORAL_TABLET | ORAL | 0 refills | Status: DC

## 2016-10-20 NOTE — Telephone Encounter (Signed)
Printed and in Kim's box 

## 2016-10-20 NOTE — Telephone Encounter (Signed)
Ok to refill? Last filled 09/23/16 #90 0RF

## 2016-10-20 NOTE — Telephone Encounter (Signed)
Pt request status of refills for alprazolam and diazepam. Pt request cb when ready for pick up.

## 2016-10-20 NOTE — Telephone Encounter (Signed)
Patient notified and Rx's placed up front for pick up. 

## 2016-10-20 NOTE — Telephone Encounter (Signed)
Ok to refill? Both last filled 09/23/16 Alprazolam #30 0RF; Diazepam #60 0RF

## 2016-10-20 NOTE — Telephone Encounter (Signed)
Patient notified and Rx placed up front for pick up. 

## 2016-10-20 NOTE — Telephone Encounter (Signed)
Pt left v/m requesting status of oxycodone apap. Call pt when ready for pick up.

## 2016-10-22 ENCOUNTER — Other Ambulatory Visit: Payer: Self-pay | Admitting: *Deleted

## 2016-10-22 MED ORDER — POTASSIUM CHLORIDE ER 10 MEQ PO TBCR: EXTENDED_RELEASE_TABLET | ORAL | 3 refills | Status: DC

## 2016-10-29 ENCOUNTER — Other Ambulatory Visit: Payer: Self-pay | Admitting: Family Medicine

## 2016-11-02 ENCOUNTER — Telehealth: Payer: Self-pay | Admitting: Family Medicine

## 2016-11-02 ENCOUNTER — Other Ambulatory Visit: Payer: Self-pay | Admitting: Family Medicine

## 2016-11-02 DIAGNOSIS — I10 Essential (primary) hypertension: Secondary | ICD-10-CM | POA: Diagnosis not present

## 2016-11-02 DIAGNOSIS — Z791 Long term (current) use of non-steroidal anti-inflammatories (NSAID): Secondary | ICD-10-CM | POA: Diagnosis not present

## 2016-11-02 DIAGNOSIS — M47817 Spondylosis without myelopathy or radiculopathy, lumbosacral region: Secondary | ICD-10-CM | POA: Diagnosis not present

## 2016-11-02 DIAGNOSIS — R200 Anesthesia of skin: Secondary | ICD-10-CM | POA: Diagnosis not present

## 2016-11-02 DIAGNOSIS — Z87891 Personal history of nicotine dependence: Secondary | ICD-10-CM | POA: Diagnosis not present

## 2016-11-02 DIAGNOSIS — M5441 Lumbago with sciatica, right side: Secondary | ICD-10-CM | POA: Diagnosis not present

## 2016-11-02 DIAGNOSIS — M5116 Intervertebral disc disorders with radiculopathy, lumbar region: Secondary | ICD-10-CM | POA: Diagnosis not present

## 2016-11-02 DIAGNOSIS — M5442 Lumbago with sciatica, left side: Secondary | ICD-10-CM | POA: Diagnosis not present

## 2016-11-02 DIAGNOSIS — M5416 Radiculopathy, lumbar region: Secondary | ICD-10-CM | POA: Diagnosis not present

## 2016-11-02 DIAGNOSIS — I252 Old myocardial infarction: Secondary | ICD-10-CM | POA: Diagnosis not present

## 2016-11-02 DIAGNOSIS — Z885 Allergy status to narcotic agent status: Secondary | ICD-10-CM | POA: Diagnosis not present

## 2016-11-02 DIAGNOSIS — G8929 Other chronic pain: Secondary | ICD-10-CM | POA: Diagnosis not present

## 2016-11-02 DIAGNOSIS — M5137 Other intervertebral disc degeneration, lumbosacral region: Secondary | ICD-10-CM | POA: Diagnosis not present

## 2016-11-02 DIAGNOSIS — Z882 Allergy status to sulfonamides status: Secondary | ICD-10-CM | POA: Diagnosis not present

## 2016-11-02 DIAGNOSIS — M797 Fibromyalgia: Secondary | ICD-10-CM | POA: Diagnosis not present

## 2016-11-02 NOTE — Telephone Encounter (Signed)
Pt stopped by to pick up rx and wanted to let Dr. Sharen Hones know she went to spine center in [facility].

## 2016-11-20 ENCOUNTER — Other Ambulatory Visit: Payer: Self-pay | Admitting: Family Medicine

## 2016-11-20 NOTE — Telephone Encounter (Signed)
Last office visit 09/28/2016.  Last refilled 10/20/2016.  Ok to refill?

## 2016-11-23 NOTE — Telephone Encounter (Signed)
Printed and in CMA box 

## 2016-11-23 NOTE — Telephone Encounter (Signed)
Left voicemail letting pt know Rx ready for pick up 

## 2016-11-27 ENCOUNTER — Other Ambulatory Visit: Payer: Self-pay | Admitting: Family Medicine

## 2016-11-27 NOTE — Telephone Encounter (Signed)
rx filled on 11/23/16, pt picked up script.

## 2016-11-30 ENCOUNTER — Ambulatory Visit (HOSPITAL_COMMUNITY): Payer: Medicare Other | Admitting: Psychiatry

## 2016-12-21 ENCOUNTER — Encounter (HOSPITAL_COMMUNITY): Payer: Self-pay | Admitting: Psychiatry

## 2016-12-21 ENCOUNTER — Encounter: Payer: Self-pay | Admitting: Emergency Medicine

## 2016-12-21 ENCOUNTER — Ambulatory Visit (INDEPENDENT_AMBULATORY_CARE_PROVIDER_SITE_OTHER): Payer: Medicare Other | Admitting: Psychiatry

## 2016-12-21 ENCOUNTER — Telehealth: Payer: Self-pay

## 2016-12-21 ENCOUNTER — Emergency Department
Admission: EM | Admit: 2016-12-21 | Discharge: 2016-12-22 | Disposition: A | Discharge status: Home / Self Care | Payer: Medicare Other | Attending: Emergency Medicine | Admitting: Emergency Medicine

## 2016-12-21 ENCOUNTER — Encounter (INDEPENDENT_AMBULATORY_CARE_PROVIDER_SITE_OTHER): Payer: Self-pay

## 2016-12-21 VITALS — BP 126/74 | HR 44 | Ht 66.0 in | Wt 189.2 lb

## 2016-12-21 DIAGNOSIS — Z79899 Other long term (current) drug therapy: Secondary | ICD-10-CM | POA: Diagnosis not present

## 2016-12-21 DIAGNOSIS — M545 Low back pain: Secondary | ICD-10-CM | POA: Diagnosis not present

## 2016-12-21 DIAGNOSIS — F319 Bipolar disorder, unspecified: Secondary | ICD-10-CM

## 2016-12-21 DIAGNOSIS — F411 Generalized anxiety disorder: Secondary | ICD-10-CM

## 2016-12-21 DIAGNOSIS — Z87891 Personal history of nicotine dependence: Secondary | ICD-10-CM

## 2016-12-21 DIAGNOSIS — J45909 Unspecified asthma, uncomplicated: Secondary | ICD-10-CM | POA: Insufficient documentation

## 2016-12-21 DIAGNOSIS — Z811 Family history of alcohol abuse and dependence: Secondary | ICD-10-CM

## 2016-12-21 DIAGNOSIS — G8929 Other chronic pain: Secondary | ICD-10-CM | POA: Insufficient documentation

## 2016-12-21 DIAGNOSIS — Z810 Family history of intellectual disabilities: Secondary | ICD-10-CM

## 2016-12-21 DIAGNOSIS — M546 Pain in thoracic spine: Secondary | ICD-10-CM | POA: Insufficient documentation

## 2016-12-21 DIAGNOSIS — M549 Dorsalgia, unspecified: Secondary | ICD-10-CM | POA: Diagnosis present

## 2016-12-21 LAB — URINE DRUG SCREEN, QUALITATIVE (ARMC ONLY)
Amphetamines, Ur Screen: NOT DETECTED
BARBITURATES, UR SCREEN: NOT DETECTED
BENZODIAZEPINE, UR SCRN: POSITIVE — AB
Cannabinoid 50 Ng, Ur ~~LOC~~: NOT DETECTED
Cocaine Metabolite,Ur ~~LOC~~: NOT DETECTED
MDMA (Ecstasy)Ur Screen: NOT DETECTED
Methadone Scn, Ur: NOT DETECTED
OPIATE, UR SCREEN: NOT DETECTED
PHENCYCLIDINE (PCP) UR S: NOT DETECTED
Tricyclic, Ur Screen: NOT DETECTED

## 2016-12-21 MED ORDER — ORPHENADRINE CITRATE 30 MG/ML IJ SOLN
60.0000 mg | INTRAMUSCULAR | Status: AC
  Administered 2016-12-21: 60 mg via INTRAMUSCULAR
  Filled 2016-12-21: qty 2

## 2016-12-21 MED ORDER — KETOROLAC TROMETHAMINE 60 MG/2ML IM SOLN
60.0000 mg | Freq: Once | INTRAMUSCULAR | Status: AC
  Administered 2016-12-21: 60 mg via INTRAMUSCULAR
  Filled 2016-12-21: qty 2

## 2016-12-21 MED ORDER — HYDROMORPHONE HCL 1 MG/ML IJ SOLN
0.5000 mg | Freq: Once | INTRAMUSCULAR | Status: AC
  Administered 2016-12-21: 0.5 mg via INTRAMUSCULAR
  Filled 2016-12-21: qty 1

## 2016-12-21 MED ORDER — ARIPIPRAZOLE 2 MG PO TABS: 2.0000 mg | ORAL_TABLET | Freq: Every day | ORAL | 1 refills | Status: DC
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
## 2016-12-21 NOTE — Telephone Encounter (Signed)
Pt left v/m; pt [facility]. Pt wanted Dr Reece Agar to know she saw doctor [facility] today. Pt also has appt on 12/28/16 with Dr Reece Agar and pt wants something done for her backpain when seen. Pt has pain in L1 due to ribs overlapping on rt side. Pt is having upper and lower back pain. Pt is not sure what doctor from [facility] sent Dr Reece Agar about her back. Pt wants to know if doctor from [facility] told Dr Reece Agar about her back and what the plan is. Pt is not doing well with her back. Pt has MRI rescheduled but pt is not sure when the MRI appt is.

## 2016-12-21 NOTE — Discharge Instructions (Signed)
You have been treated in the ED for a flare of your chronic back pain. You should continue your previously prescribed Percocet, Alprazolam, and Diazepam; along with your other maintenance medications. Please note the hospital's chronic pain management policy below. You may not receive narcotic pain medicines should you present with this chronic complaint in the future. See Dr. Reece Agar as scheduled and proceed with your MRI as schedule (July 9th @ 10:45 am). You should consider a referral to pain management for your long-term pain management needs. Return to the ED for leg weakness, foot drop, or incontinence.   Emergency care providers appreciate that many patients coming to [place] are in severe pain and we wish to address their pain in the safest, most responsible manner.  It is important to recognize, however, that the proper treatment of chronic pain differs from that of the pain of injuries and acute illnesses.  Our goal is to provider quality, safe, personalized care and we thank you for giving [place] the opportunity to serve you.  The use of narcotics and related agents for chronic pain syndromes may lead to additional physical and psychological problems.  Nearly as many people die from prescription narcotics each year as die from car crashes.  Additionally, this risk is increased if such prescriptions are obtained from a variety of sources.  Therefore, only your primary care physician or a pain management specialist is able to safely treat such syndromes with narcotic medications long-term.  Documentation revealing such prescriptions have been sought from multiple sources may prohibit [place] from providing a refill or different narcotic medication.  Your name may be checked first through the [facility] Controlled Substances Reporting System.  This database is a record of controlled substance medication prescriptions that the patient has received.  This has been established by [facility] in an effort to eliminate  the dangerous, and often life-threatening, practice of obtaining multiple prescriptions from different medical providers.  If you have a chronic pain syndrome (i.e. chronic headaches, recurrent back or neck pain, dental pain, abdominal or pelvic pain without a specific diagnosis, or neuropathic pain such as fibromyalgia) or recurrent visits for the same condition without an acute diagnosis, you may be treated with non-narcotics and other non-addictive medicines.  Allergic reactions or negative side effects that may be reported by a patient to such medications will not typically lead to the use of a narcotic analgesic or other controlled substance as an alternative.  Patients managing chronic pain with a personal physician should have provisions in place for breakthrough pain.  If you are in crisis, you should call your physician.  If your physician directs you to the emergency department, please have the doctor call and speak to our attending physician concerning your care.  When patients come to the Emergency Department (ED) with acute medical conditions in which the ED physician feels it is appropriate to prescribe narcotic or sedating pain medication, the physician will prescribe these in very limited quantities.  The amount of these medications will last only until you can see your primary care physician in his/her office.  Any patient who returns to the ED seeking refills should expect only non-narcotic pain medications.  In the event an acute medical condition exists and the emergency physician feels it is necessary that the patient be given a narcotic or sedating medication, a responsible adult driver should be present in the room prior to the medication being given by the nurse.  Prescriptions for narcotic or sedating medications that have  been lost, stolen, or expired will NOT be refilled in the ED.  Patients who have chronic pain may receive non-narcotic prescriptions until seen by their  primary care physician.  It is every patient's personal responsibility to maintain active prescriptions with his or her primary care physician or specialist.

## 2016-12-21 NOTE — Progress Notes (Signed)
126/74    44   

## 2016-12-21 NOTE — Telephone Encounter (Signed)
Noted. Will see then.  Reviewed records. Last saw Dr Sabino Gasser 10/2016, she cancelled MRI. He had recommended pain management referral.  Saw psychiatry Dr Lolly Mustache - started on abilify for bipolar I with GAD. Unable to commit to counseling due to lack of transportation.  I see pt currently at ER.
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
## 2016-12-24 ENCOUNTER — Other Ambulatory Visit: Payer: Self-pay | Admitting: Family Medicine

## 2016-12-24 NOTE — Telephone Encounter (Signed)
Pt left v/m requesting refill xanax (last printed # 30 on 11/23/16), valium (last printed # 60 on 11/23/16) and oxycodone apap (last printed # 90 on 11/23/16). Last seen 09/28/16. Pt has appt on 12/28/16 and pt wants to pick up rx on 12/28/16 due to transportation issues.

## 2016-12-25 NOTE — Telephone Encounter (Signed)
Printed and in CMA box 

## 2016-12-25 NOTE — Telephone Encounter (Signed)
Patient notified will collect on Monday morning

## 2016-12-28 ENCOUNTER — Encounter: Payer: Self-pay | Admitting: Family Medicine

## 2016-12-28 ENCOUNTER — Ambulatory Visit (INDEPENDENT_AMBULATORY_CARE_PROVIDER_SITE_OTHER): Payer: Medicare Other | Admitting: Family Medicine

## 2016-12-28 ENCOUNTER — Ambulatory Visit: Payer: Medicare Other | Admitting: Family Medicine

## 2016-12-28 VITALS — BP 138/78 | HR 100 | Temp 98.2°F | Ht 66.0 in

## 2016-12-28 DIAGNOSIS — G8929 Other chronic pain: Secondary | ICD-10-CM | POA: Diagnosis not present

## 2016-12-28 DIAGNOSIS — M0600 Rheumatoid arthritis without rheumatoid factor, unspecified site: Secondary | ICD-10-CM

## 2016-12-28 DIAGNOSIS — M5432 Sciatica, left side: Secondary | ICD-10-CM | POA: Diagnosis not present

## 2016-12-28 DIAGNOSIS — F411 Generalized anxiety disorder: Secondary | ICD-10-CM

## 2016-12-28 DIAGNOSIS — F319 Bipolar disorder, unspecified: Secondary | ICD-10-CM | POA: Diagnosis not present

## 2016-12-28 DIAGNOSIS — Z79891 Long term (current) use of opiate analgesic: Secondary | ICD-10-CM | POA: Diagnosis not present

## 2016-12-28 DIAGNOSIS — Z79899 Other long term (current) drug therapy: Secondary | ICD-10-CM | POA: Diagnosis not present

## 2016-12-28 DIAGNOSIS — G894 Chronic pain syndrome: Secondary | ICD-10-CM | POA: Diagnosis not present

## 2016-12-28 MED ORDER — COLCHICINE 0.6 MG PO TABS
0.6000 mg | ORAL_TABLET | Freq: Every day | ORAL | 1 refills | Status: DC | PRN
Start: 2016-12-28 — End: 2017-02-24

## 2016-12-28 MED ORDER — VITAMIN C 500 MG PO TABS: 500.0000 mg | ORAL_TABLET | Freq: Every day | ORAL | Status: DC

## 2016-12-28 NOTE — Assessment & Plan Note (Addendum)
Patient was worried plaquenil may have caused "sparks in vision" so she stopped. Also worried about plaquenil cross-reactivity with fluoroquinolones. Advised should be ok to take plaquenil. I suggested she re-trial plaquenil and if recurrent vision changes, ok to stop medication. She has not returned to see Dr Gavin Potters (last seen 08/2016).
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
## 2016-12-28 NOTE — Assessment & Plan Note (Signed)
Appreciate psychiatric care with Dr Lolly Mustache. Patient feels "clearer mind" with commencement of abilify.
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
## 2016-12-28 NOTE — Assessment & Plan Note (Signed)
Refilled oxycodone and other controlled substances. Update UDS today.

## 2016-12-28 NOTE — Assessment & Plan Note (Signed)
Refilled oxycodone and other controlled substances. Update UDS today.  

## 2016-12-28 NOTE — Assessment & Plan Note (Signed)
Longstanding, severe. She has been on valium/xanax for prolonged period. We have been able to decrease xanax to once daily, but continued valium 10mg  bid. Goal will be to simplify benzo regimen to one type. No changes made today.

## 2016-12-28 NOTE — Patient Instructions (Addendum)
Price out alpha lipoic acid for nerve pain.  Try colchicine - use sparingly.  Update urine drug screen today.  We will await MRI.  Return in 4-6 wks for follow up visit.

## 2016-12-29 LAB — URIC ACID: Uric Acid, Serum: 4.1 mg/dL (ref 2.4–7.0)

## 2016-12-30 ENCOUNTER — Other Ambulatory Visit: Payer: Self-pay | Admitting: Internal Medicine

## 2016-12-30 DIAGNOSIS — R05 Cough: Secondary | ICD-10-CM

## 2016-12-30 DIAGNOSIS — R059 Cough, unspecified: Secondary | ICD-10-CM

## 2017-01-26 ENCOUNTER — Other Ambulatory Visit: Payer: Self-pay | Admitting: Family Medicine

## 2017-01-26 MED ORDER — OMEGA-3-ACID ETHYL ESTERS 1 G PO CAPS: ORAL_CAPSULE | ORAL | 6 refills | Status: DC

## 2017-01-26 NOTE — Telephone Encounter (Signed)
Last office visit 12/28/2016.  Last refilled 12/25/2016.  Controlled Substance contract signed 03/27/2016.  Last UDS 12/30/2016.  Ok to refill?

## 2017-01-26 NOTE — Telephone Encounter (Signed)
Pt left v/m requesting refill fish oil to [facility]. Done.

## 2017-01-27 NOTE — Telephone Encounter (Signed)
Called patient & informed RX are avialble for pick-up @ front office

## 2017-01-27 NOTE — Telephone Encounter (Signed)
Printed and in CMA box 

## 2017-02-15 ENCOUNTER — Telehealth: Payer: Self-pay | Admitting: *Deleted

## 2017-02-15 ENCOUNTER — Ambulatory Visit: Payer: Self-pay | Admitting: Family Medicine

## 2017-02-15 DIAGNOSIS — G8929 Other chronic pain: Secondary | ICD-10-CM | POA: Diagnosis not present

## 2017-02-15 DIAGNOSIS — M47816 Spondylosis without myelopathy or radiculopathy, lumbar region: Secondary | ICD-10-CM | POA: Diagnosis not present

## 2017-02-15 DIAGNOSIS — M4726 Other spondylosis with radiculopathy, lumbar region: Secondary | ICD-10-CM | POA: Diagnosis not present

## 2017-02-15 DIAGNOSIS — M5442 Lumbago with sciatica, left side: Secondary | ICD-10-CM | POA: Diagnosis not present

## 2017-02-15 DIAGNOSIS — M48061 Spinal stenosis, lumbar region without neurogenic claudication: Secondary | ICD-10-CM | POA: Diagnosis not present

## 2017-02-15 NOTE — Telephone Encounter (Signed)
Fax received requesting medication refill of    Lidocaine 5% ointment. Apply 2-3g topically to affected area 3-4xs per day.  This medication is not on her current or historical med list. pls advise

## 2017-02-17 ENCOUNTER — Other Ambulatory Visit: Payer: Self-pay | Admitting: Family Medicine

## 2017-02-17 DIAGNOSIS — F319 Bipolar disorder, unspecified: Secondary | ICD-10-CM

## 2017-02-17 NOTE — Telephone Encounter (Signed)
Spoke to pt and advised regarding Rx. States that she has a f/u to the MRI on 8/28 at 0730 with the spine center in [place] to discuss her Tx options. She has an appt with Dr g on 8/20, but is wanting to reschedule until after her 8/28 appt. pls advise

## 2017-02-17 NOTE — Telephone Encounter (Signed)
Ok to reschedule. Thanks.

## 2017-02-17 NOTE — Telephone Encounter (Signed)
Ok to reschedule appt.  Printed and in CMA box.

## 2017-02-17 NOTE — Telephone Encounter (Signed)
Would deny lidocaine ointment at this time. We may further discuss at next OV.

## 2017-02-17 NOTE — Telephone Encounter (Signed)
Pt left v/m; pt has appt 03/09/17 to see spine center at [place] to discuss what needs to be done to pts back. Pt had MRI on 02/15/17. Pt said there is no reason for pt to see Dr Reece Agar until sees the spine center doctors. Pt wants to change the appt on 03/01/17 with Dr Reece Agar to an appt next month. Please advise. Pt also wants to pick up rx for valium (last printed # 60 on 01/27/17), percocet (last printed # 90 on 01/27/17) and Xanax (last printed # 30 on 01/27/17). Pt last seen 12/28/16. Please advise.

## 2017-02-18 ENCOUNTER — Telehealth: Payer: Self-pay

## 2017-02-18 ENCOUNTER — Other Ambulatory Visit: Payer: Self-pay | Admitting: Family Medicine

## 2017-02-18 DIAGNOSIS — F319 Bipolar disorder, unspecified: Secondary | ICD-10-CM

## 2017-02-18 MED ORDER — ARIPIPRAZOLE 2 MG PO TABS: 2.0000 mg | ORAL_TABLET | Freq: Every day | ORAL | 1 refills | Status: DC

## 2017-02-18 NOTE — Telephone Encounter (Signed)
Patient needs to make decision about her clinic choice, with possible input from PCP.  I have to defer this to PCP.  I didn't put in orders.  Routed to PCP.

## 2017-02-18 NOTE — Telephone Encounter (Signed)
I recommend this request come from pharmacist (for specific name of med) and [facility] nurse. Pt should ask them to call our office to let us know what is recommended. I don't know what gout med she's talking about and would prefer she stay with [facility].

## 2017-02-18 NOTE — Telephone Encounter (Signed)
Pt has f/u appt scheduled, but pt stated Dr Lolly Mustache told her that she should be able to get Rx from PCP.Marland KitchenMarland KitchenMarland Kitchen

## 2017-02-18 NOTE — Telephone Encounter (Signed)
Pt left v/m; Kaylee [name] with [facility] has a new arthritic - gout med for pt. The [facility] nurse wants pt to be switched back to [facility] rheumatology instead of Dr [facility].The [facility] nurse wants arthritic med for pain in knees,ankles and hips and wants pt on Krill oil instead of fish oil. If pt gets new arthritic med pt can stop med Dr Gavin Potters gave pt and pt can stop gout med as well. To contact Kaylee [name] with [facility] pharmacy if needed call [phone]. Pt request cb.

## 2017-02-18 NOTE — Telephone Encounter (Signed)
Plz touch base with pharmacist - pt will need to come in to discuss lidocaine ointment as I've never prescribed this. We have done lidoderm patches before, but not ointment.  As far as omega 3 - is this generic for lovaza? We refilled #60 with 6 refills last month.

## 2017-02-18 NOTE — Telephone Encounter (Signed)
appt rescheduled.

## 2017-02-18 NOTE — Telephone Encounter (Signed)
Will refill while patient gets back in to psych.  Needs psychiatrist to manage her bipolar disorder. Overdue for f/u.

## 2017-02-18 NOTE — Telephone Encounter (Signed)
John at [facility] pharmacy left v/m requesting a phone call to approve omega 3 and lidocaine 5 % ointment so can send med to pt today; please use ref # [id number].

## 2017-02-18 NOTE — Telephone Encounter (Signed)
Pt also wants to know if you will refill the abilify, last filled 12/21/16 with 1 refill-- last filled by Dr Lolly Mustache.  the patient reports the pharmacy should be sending a request... Pt states she does not want to stay with [facility], and will only stay with them if you fill this new medication. I asked pt to contact pharmacy and make sure they will send request and I will be looking for the request via fax... Pt expressed understanding and Rx left in front office for pick up and pt is aware

## 2017-02-18 NOTE — Telephone Encounter (Signed)
Please refer to refill encounter for further note

## 2017-02-23 NOTE — Telephone Encounter (Signed)
Spoke to pt who states she is not wanting lidocaine and that was a mix up from the home health nurse, and she is awaiting to see the specialist regarding her back. She is requesting a Rx for colchicine until she is able to determine the name of the medication recommended by the home health nurse. She states she will have them send an electronic request for what she is needing. Also, pt is requesting a Rx for Krill oil instead of fish oil as she is currently paying out of pocket.

## 2017-02-23 NOTE — Telephone Encounter (Signed)
Attempted to contact pharmacy; left vm requesting a call back. LM on pts vm requesting a call back as well. pls scheduled pt appt with Dr Reece Agar to discuss lidocaine, should she return call.

## 2017-02-23 NOTE — Telephone Encounter (Signed)
Pt returned call, thanks

## 2017-02-24 MED ORDER — KRILL OIL OMEGA-3 500 MG PO CAPS: 2.0000 | ORAL_CAPSULE | Freq: Every day | ORAL | 11 refills | Status: DC

## 2017-02-24 MED ORDER — COLCHICINE 0.6 MG PO TABS: 0.6000 mg | ORAL_TABLET | Freq: Every day | ORAL | 1 refills | Status: DC | PRN

## 2017-02-24 MED ORDER — OMEGA-3-ACID ETHYL ESTERS 1 G PO CAPS: 2.0000 g | ORAL_CAPSULE | Freq: Two times a day (BID) | ORAL | 1 refills | Status: DC

## 2017-02-24 NOTE — Telephone Encounter (Addendum)
Received request for lidocaine ointment - denied as per previous discussions. Received request for omega 3 acid etyl esters cap 1gm - approved and sent electronically.

## 2017-02-24 NOTE — Addendum Note (Signed)
Addended by: [name] on: 02/24/2017 09:50 AM   Modules accepted: Orders

## 2017-02-24 NOTE — Telephone Encounter (Signed)
colchicine sent in [place] oil sent in

## 2017-03-01 ENCOUNTER — Ambulatory Visit: Payer: Medicare Other | Admitting: Family Medicine

## 2017-03-03 ENCOUNTER — Other Ambulatory Visit: Payer: Self-pay | Admitting: Family Medicine

## 2017-03-09 DIAGNOSIS — G8929 Other chronic pain: Secondary | ICD-10-CM | POA: Diagnosis not present

## 2017-03-09 DIAGNOSIS — M545 Low back pain: Secondary | ICD-10-CM | POA: Diagnosis not present

## 2017-03-17 ENCOUNTER — Ambulatory Visit (INDEPENDENT_AMBULATORY_CARE_PROVIDER_SITE_OTHER): Payer: Medicare Other | Admitting: Family Medicine

## 2017-03-17 ENCOUNTER — Encounter: Payer: Self-pay | Admitting: Family Medicine

## 2017-03-17 ENCOUNTER — Other Ambulatory Visit: Payer: Self-pay | Admitting: Family Medicine

## 2017-03-17 VITALS — BP 128/78 | HR 78 | Temp 98.2°F | Wt 192.2 lb

## 2017-03-17 DIAGNOSIS — G894 Chronic pain syndrome: Secondary | ICD-10-CM

## 2017-03-17 DIAGNOSIS — F319 Bipolar disorder, unspecified: Secondary | ICD-10-CM

## 2017-03-17 DIAGNOSIS — Z23 Encounter for immunization: Secondary | ICD-10-CM

## 2017-03-17 DIAGNOSIS — M5432 Sciatica, left side: Secondary | ICD-10-CM | POA: Diagnosis not present

## 2017-03-17 DIAGNOSIS — M0600 Rheumatoid arthritis without rheumatoid factor, unspecified site: Secondary | ICD-10-CM | POA: Diagnosis not present

## 2017-03-17 DIAGNOSIS — F411 Generalized anxiety disorder: Secondary | ICD-10-CM

## 2017-03-17 DIAGNOSIS — M1990 Unspecified osteoarthritis, unspecified site: Secondary | ICD-10-CM | POA: Diagnosis not present

## 2017-03-17 DIAGNOSIS — R609 Edema, unspecified: Secondary | ICD-10-CM | POA: Diagnosis not present

## 2017-03-17 DIAGNOSIS — G8929 Other chronic pain: Secondary | ICD-10-CM

## 2017-03-17 DIAGNOSIS — Z79899 Other long term (current) drug therapy: Secondary | ICD-10-CM | POA: Diagnosis not present

## 2017-03-17 DIAGNOSIS — Z79891 Long term (current) use of opiate analgesic: Secondary | ICD-10-CM | POA: Diagnosis not present

## 2017-03-17 MED ORDER — TRIAMCINOLONE ACETONIDE 0.1 % EX CREA: 1.0000 "application " | TOPICAL_CREAM | Freq: Two times a day (BID) | CUTANEOUS | 0 refills | Status: DC

## 2017-03-17 MED ORDER — ALLOPURINOL 100 MG PO TABS: 100.0000 mg | ORAL_TABLET | Freq: Every day | ORAL | 6 refills | Status: DC

## 2017-03-17 MED ORDER — ALPRAZOLAM 1 MG PO TABS: 1.0000 mg | ORAL_TABLET | Freq: Every day | ORAL | 0 refills | Status: DC

## 2017-03-17 MED ORDER — OXYCODONE-ACETAMINOPHEN 7.5-325 MG PO TABS: 1.0000 | ORAL_TABLET | Freq: Three times a day (TID) | ORAL | 0 refills | Status: DC | PRN

## 2017-03-17 MED ORDER — DIAZEPAM 10 MG PO TABS: 10.0000 mg | ORAL_TABLET | Freq: Two times a day (BID) | ORAL | 0 refills | Status: DC

## 2017-03-17 NOTE — Telephone Encounter (Signed)
Pt saw Dr Lolly Mustache 12/2016 - doesn't need rpt referral as just recently seen. She may call #[phone]  to schedule appt.

## 2017-03-17 NOTE — Telephone Encounter (Signed)
Pt stated she needed a referral to psychiatrist. There's not a referral in for that  She didn't know who she went to before.  Do you know who she has seen in past?  Because of hippa I can't see those appointments Thanks

## 2017-03-17 NOTE — Progress Notes (Signed)
BP 128/78   Pulse 78   Temp 98.2 F (36.8 C) (Oral)   Wt 192 lb 4 oz (87.2 kg)   SpO2 95%   BMI 31.03 kg/m    CC: 89mo f/u visit Subjective:    Patient ID: Kaylee [name], female    DOB: 1961/03/08, 56 y.o.   MRN: [id number]  HPI: Kaylee [name] is a 56 y.o. female presenting on 03/17/2017 for Follow-up   See prior note for details.  Now followed by [facility] spine center Dr Sabino Gasser (last seen 02/2017) who recommended PT (pt has declined) and referral to pain management program to discuss shots into back - recommending the one at [facility]. I appreciate his care.   Colchicine has significantly helped her pains. She insists that she has gout - states she had gout flares as teenager. Would like to try allopurinol. Wants to come off sulindac, misoprostol, plaquenil. Does not want to return to see Dr Gavin Potters rheumatology - was followed for ?rheumatoid arthritis and chronic back pain.   Lumbar MRI showing L sided subarticular/foraminal disc protrusion at L3/4 and lateral recess stenosis at L4/5 with mild central stenosis.   Sees psych Dr Lolly Mustache who manages bipolar I with GAD - on abilify. Unable to seek counseling due to transportation issues. Requests we continue to fill medication.   Relevant past medical, surgical, family and social history reviewed and updated as indicated. Interim medical history since our last visit reviewed. Allergies and medications reviewed and updated. Outpatient Medications Prior to Visit  Medication Sig Dispense Refill  . acyclovir (ZOVIRAX) 400 MG tablet Take 1 tablet (400 mg total) by mouth 2 (two) times daily. 60 tablet 6  . Alpha-Lipoic Acid 600 MG CAPS Take 1 capsule by mouth daily.    . ARIPiprazole (ABILIFY) 2 MG tablet Take 1 tablet (2 mg total) by mouth daily. 30 tablet 1  . atorvastatin (LIPITOR) 10 MG tablet Take 1 tablet (10 mg total) by mouth daily. 30 tablet 0  . b complex vitamins tablet Take 1 tablet by mouth daily.    . calcium-vitamin D (OSCAL)  250-125 MG-UNIT per tablet Take 2 tablets by mouth daily.    . cetirizine (ZYRTEC) 10 MG tablet Take 1 tablet (10 mg total) by mouth daily. 30 tablet 15  . colchicine 0.6 MG tablet Take 1 tablet (0.6 mg total) by mouth daily as needed (arthritis pain). 30 tablet 1  . dicyclomine (BENTYL) 10 MG capsule Take 1 capsule (10 mg total) by mouth 3 (three) times daily as needed for spasms (with meals). 60 capsule 3  . EPINEPHrine 0.3 mg/0.3 mL IJ SOAJ injection Inject 0.3 mg into the muscle once. For quinolone reaction    . esomeprazole (NEXIUM) 40 MG capsule TAKE 1 CAPSULE BY MOUTH PRN 90 capsule 1  . fluticasone (FLONASE) 50 MCG/ACT nasal spray Place 2 sprays into both nostrils daily. 16 g 15  . Fluticasone-Salmeterol (ADVAIR DISKUS) 250-50 MCG/DOSE AEPB INHALE 1 PUFF BY MOUTH TWICE A DAY 60 each 11  . furosemide (LASIX) 20 MG tablet TAKE ONE (1) TABLET EACH DAY AS NEEDED 90 tablet 3  . magnesium oxide (MAG-OX) 400 MG tablet Take 400 mg by mouth daily.    . methocarbamol (ROBAXIN) 500 MG tablet TAKE ONE (1) TABLET BY MOUTH THREE TIMESA DAY AS NEEDED FOR MUSCLE SPASMS 60 tablet 6  . Multiple Vitamins-Minerals (PRESERVISION AREDS PO) Take by mouth 2 (two) times daily.    [name] neomycin-polymyxin b-dexamethasone (MAXITROL) 3.5-10000-0.1 SUSP PUT ONE DROP  INTO THE LEFT EYE AS NEEDED 5 mL 1  . NEOMYCIN-POLYMYXIN-HYDROCORTISONE (CORTISPORIN) 1 % SOLN otic solution PLACE 3 DROPS INTO THE LEFT EAR 3 TIMES A DAY AS DIRECTED 10 mL 1  . nitrofurantoin (MACRODANTIN) 100 MG capsule TAKE ONE (1) CAPSULE EACH DAY FOR UTI prevention 90 capsule 3  . omega-3 acid ethyl esters (LOVAZA) 1 g capsule TWO CAPSULES EACH DAY 60 capsule 6  . polyethylene glycol powder (GLYCOLAX/MIRALAX) powder MIX 17GM (AS MARKED IN BOTTLE TOP) IN 8 OUNCES OF WATER, MIX AND DRINK TWICE A DAY AS NEEDED FOR MODERATE CONSTIPATION. 850 g 3  . potassium chloride (K-DUR) 10 MEQ tablet ONE TABLET EACH DAY AS NEEDED 90 tablet 3  . PROAIR HFA 108 (90 Base)  MCG/ACT inhaler INHALE TWO PUFFS INTO THE LUNGS EVERY 4 (FOUR) HOURS AS NEEDED 8.5 g 0  . sulindac (CLINORIL) 200 MG tablet Take 200 mg by mouth 2 (two) times daily.    [name] tiotropium (SPIRIVA HANDIHALER) 18 MCG inhalation capsule Place 1 capsule (18 mcg total) into inhaler and inhale daily. 90 capsule 3  . vitamin A 8000 UNIT capsule Take 8,000 Units by mouth every other day.    . vitamin C (ASCORBIC ACID) 500 MG tablet Take 1 tablet (500 mg total) by mouth daily.    [name] ALPRAZolam (XANAX) 1 MG tablet TAKE 1 TABLET BY MOUTH EVERY DAY 30 tablet 0  . diazepam (VALIUM) 10 MG tablet TAKE 1 TABLET BY MOUTH TWICE A DAY 60 tablet 0  . omega-3 acid ethyl esters (LOVAZA) 1 g capsule Take 2 capsules (2 g total) by mouth 2 (two) times daily. 360 capsule 1  . oxyCODONE-acetaminophen (PERCOCET) 7.5-325 MG tablet TAKE 1 TABLET BY MOUTH EVERY 8 HOURS AS NEEDED FOR PAIN 90 tablet 0   No facility-administered medications prior to visit.      Per HPI unless specifically indicated in ROS section below Review of Systems     Objective:    BP 128/78   Pulse 78   Temp 98.2 F (36.8 C) (Oral)   Wt 192 lb 4 oz (87.2 kg)   SpO2 95%   BMI 31.03 kg/m   Wt Readings from Last 3 Encounters:  03/17/17 192 lb 4 oz (87.2 kg)  12/21/16 189 lb (85.7 kg)  12/21/16 189 lb 3.2 oz (85.8 kg)    Physical Exam  Constitutional: She appears well-developed and well-nourished. No distress.  Skin: Skin is warm and dry. Rash noted.  Slightly scaly pruritic hypopigmented rash L dorsal wrist  Psychiatric: She has a normal mood and affect.  Nursing note and vitals reviewed.  Results for orders placed or performed in visit on 12/28/16  Uric acid  Result Value Ref Range   Uric Acid, Serum 4.1 2.4 - 7.0 mg/dL   Lab Results  Component Value Date   CREATININE 0.84 03/27/2016   Lab Results  Component Value Date   LABURIC 4.1 12/28/2016       Assessment & Plan:  Requests handicap placard which was filled out today.    Problem List Items Addressed This Visit    Back pain with left-sided sciatica   Relevant Medications   ALPRAZolam (XANAX) 1 MG tablet   diazepam (VALIUM) 10 MG tablet   oxyCODONE-acetaminophen (PERCOCET) 7.5-325 MG tablet   Bipolar 1 disorder [facility])    She established with Dr Lolly Mustache earlier this summer 12/2016. He started her on abilify, and she has noted significant improvement with this. Per his last note, psych did recommend  return for f/u in 4-6 wks. Pt states she would like to continue abilify management here, aware of need to return to psych if any worsening mood develops. I did recommend she return for 1 f/u visit with Dr Lolly Mustache to ensure no further changes recommended. She agrees to this.       Chronic inflammatory arthritis    Endorses marked improvement in arthralgias since colchicine was started. Last uric acid was normal (but she was taking multiple herbal remedies that supposedly would lower her urate). ?gout vs pseudogout, discussed with patient. I agreed to trial allopurinol at 100mg  daily to see effect on arthritis pain. Pt aware that if possible pseudogout, allopurinol will likely not have any effect.       Relevant Medications   oxyCODONE-acetaminophen (PERCOCET) 7.5-325 MG tablet   allopurinol (ZYLOPRIM) 100 MG tablet   Other Relevant Orders   Ambulatory referral to Pain Clinic   Chronic pain syndrome    Current pain regimen is oxycodone 7.5/325mg  TID PRN #90/month.       Relevant Orders   Ambulatory referral to Pain Clinic   Encounter for chronic pain management - Primary    [place] CSRS reviewed and appropriate. Will refer to pain management clinic per neurosurgery recommendation. Pt interested in further discussion of possible spine injections.       Relevant Orders   Ambulatory referral to Pain Clinic   GAD (generalized anxiety disorder)    She is on xanax 1mg  daily and valium 10mg  daily.       Peripheral edema    Convinced colchicine has helped with her edema.        Seronegative rheumatoid arthritis (HCC)    Pt desires to come off plaquenil and does not want to return to see Dr [phone] despite my recommendations otherwise.  See below.       Relevant Medications   oxyCODONE-acetaminophen (PERCOCET) 7.5-325 MG tablet   allopurinol (ZYLOPRIM) 100 MG tablet   Other Relevant Orders   Ambulatory referral to Pain Clinic    Other Visit Diagnoses    Need for influenza vaccination       Relevant Orders   Flu Vaccine QUAD 6+ mos PF IM (Fluarix Quad PF) (Completed)       Follow up plan: Return in about 3 months (around 06/16/2017), or if symptoms worsen or fail to improve, for follow up visit.  Gavin Potters, MD

## 2017-03-17 NOTE — Patient Instructions (Addendum)
Flu shot today.  You may have gout or pseudogout. If pseudogout, allopurinol will not help.  Let's trial allopurinol 100mg  daily - take colchicine 1 tablet daily for 5 days while we start allopurinol as this can cause gout flare.  Keep lasix on hand - but use as needed for leg swelling.  Ok to trial off plaquenil and we will try allopurinol instead for possible gout.  We will refer you to pain clinic at [facility] through anesthesia department.  Return in 3 months for follow up visit.

## 2017-03-17 NOTE — Assessment & Plan Note (Signed)
[place] CSRS reviewed and appropriate. Will refer to pain management clinic per neurosurgery recommendation. Pt interested in further discussion of possible spine injections.

## 2017-03-17 NOTE — Assessment & Plan Note (Addendum)
Pt desires to come off plaquenil and does not want to return to see Dr Gavin Potters despite my recommendations otherwise.  See below.

## 2017-03-17 NOTE — Assessment & Plan Note (Addendum)
Endorses marked improvement in arthralgias since colchicine was started. Last uric acid was normal (but she was taking multiple herbal remedies that supposedly would lower her urate). ?gout vs pseudogout, discussed with patient. I agreed to trial allopurinol at 100mg  daily to see effect on arthritis pain. Pt aware that if possible pseudogout, allopurinol will likely not have any effect.

## 2017-03-17 NOTE — Telephone Encounter (Signed)
Thank you :)

## 2017-03-17 NOTE — Telephone Encounter (Signed)
Pt aware.

## 2017-03-17 NOTE — Assessment & Plan Note (Signed)
Convinced colchicine has helped with her edema.

## 2017-03-17 NOTE — Assessment & Plan Note (Signed)
She is on xanax 1mg  daily and valium 10mg  daily.

## 2017-03-18 NOTE — Assessment & Plan Note (Signed)
Current pain regimen is oxycodone 7.5/325mg  TID PRN #90/month.

## 2017-03-18 NOTE — Telephone Encounter (Signed)
Pt called Dr Lolly Mustache office and was told that pt was released and PCP can write pts meds. Pt request refill of abilify. Pt request cb.[employer] [facility] [employer].

## 2017-03-18 NOTE — Telephone Encounter (Signed)
Spoke to Regan at Dr Sheela Stack office who states pt was to f/u in 4-6wks. She advised their office that she would contact office back to schedule due to transportation but failed to do so. Regan states she will contact pt and attempt to schedule.

## 2017-03-18 NOTE — Assessment & Plan Note (Signed)
She established with Dr Lolly Mustache earlier this summer 12/2016. He started her on abilify, and she has noted significant improvement with this. Per his last note, psych did recommend return for f/u in 4-6 wks. Pt states she would like to continue abilify management here, aware of need to return to psych if any worsening mood develops. I did recommend she return for 1 f/u visit with Dr Lolly Mustache to ensure no further changes recommended. She agrees to this.

## 2017-03-18 NOTE — Telephone Encounter (Signed)
plz call to verify with Dr Sheela Stack office as his last note 12/21/2016 said f/u 4-6 wks for office visit.

## 2017-03-18 NOTE — Addendum Note (Signed)
Addended by: Patience Musca on: 03/18/2017 12:09 PM   Modules accepted: Orders

## 2017-03-22 NOTE — Addendum Note (Signed)
Addended by: [name] on: 03/22/2017 01:27 PM   Modules accepted: Orders

## 2017-03-22 NOTE — Telephone Encounter (Signed)
We refilled last month x1 RF - will await eval by psych prior to more refills.

## 2017-03-30 ENCOUNTER — Ambulatory Visit (HOSPITAL_COMMUNITY): Payer: Self-pay | Admitting: Psychiatry

## 2017-04-01 ENCOUNTER — Other Ambulatory Visit: Payer: Self-pay | Admitting: Family Medicine

## 2017-04-05 ENCOUNTER — Telehealth: Payer: Self-pay

## 2017-04-05 ENCOUNTER — Ambulatory Visit: Payer: Medicare Other | Admitting: Family Medicine

## 2017-04-05 MED ORDER — METHOCARBAMOL 750 MG PO TABS: 750.0000 mg | ORAL_TABLET | Freq: Two times a day (BID) | ORAL | 3 refills | Status: DC | PRN

## 2017-04-05 NOTE — Telephone Encounter (Signed)
Spoke with pt, relayed message per Dr. G. Pt says ok. 

## 2017-04-05 NOTE — Telephone Encounter (Signed)
Pt left v/m that pts [facility] cannot get methocarb[facility]ol [facility] mg; [facility] 750 mg and request new rx with updated instructions to [facility]  Pt is going out of state on 04/08/17 for at least one month to help x husband after having surgery. Pt request cb.

## 2017-04-05 NOTE — Telephone Encounter (Signed)
plz notify I've sent in methocarbamol 750mg  but with change in sig from TID to BID given increased dose. Caution with sedation on higher dose med.

## 2017-04-06 DIAGNOSIS — N189 Chronic kidney disease, unspecified: Secondary | ICD-10-CM | POA: Diagnosis not present

## 2017-04-06 DIAGNOSIS — Z880 Allergy status to penicillin: Secondary | ICD-10-CM | POA: Diagnosis not present

## 2017-04-06 DIAGNOSIS — Z79899 Other long term (current) drug therapy: Secondary | ICD-10-CM | POA: Diagnosis not present

## 2017-04-06 DIAGNOSIS — R109 Unspecified abdominal pain: Secondary | ICD-10-CM | POA: Diagnosis not present

## 2017-04-06 DIAGNOSIS — I252 Old myocardial infarction: Secondary | ICD-10-CM | POA: Diagnosis not present

## 2017-04-06 DIAGNOSIS — Z882 Allergy status to sulfonamides status: Secondary | ICD-10-CM | POA: Diagnosis not present

## 2017-04-06 DIAGNOSIS — I129 Hypertensive chronic kidney disease with stage 1 through stage 4 chronic kidney disease, or unspecified chronic kidney disease: Secondary | ICD-10-CM | POA: Diagnosis not present

## 2017-04-06 DIAGNOSIS — Z885 Allergy status to narcotic agent status: Secondary | ICD-10-CM | POA: Diagnosis not present

## 2017-04-06 DIAGNOSIS — Z87891 Personal history of nicotine dependence: Secondary | ICD-10-CM | POA: Diagnosis not present

## 2017-04-06 DIAGNOSIS — N950 Postmenopausal bleeding: Secondary | ICD-10-CM | POA: Diagnosis not present

## 2017-04-06 DIAGNOSIS — N760 Acute vaginitis: Secondary | ICD-10-CM | POA: Diagnosis not present

## 2017-04-06 DIAGNOSIS — N3001 Acute cystitis with hematuria: Secondary | ICD-10-CM | POA: Diagnosis not present

## 2017-04-06 DIAGNOSIS — R1032 Left lower quadrant pain: Secondary | ICD-10-CM | POA: Diagnosis not present

## 2017-04-12 ENCOUNTER — Other Ambulatory Visit: Payer: Self-pay | Admitting: Family Medicine

## 2017-04-12 ENCOUNTER — Telehealth: Payer: Self-pay

## 2017-04-12 DIAGNOSIS — G8929 Other chronic pain: Secondary | ICD-10-CM

## 2017-04-12 NOTE — Telephone Encounter (Signed)
Pt cb and she cannot get in to see the OB GYN any time this month; pt has to stay now to take care of her husband and pt request Dr Reece Agar to refer pt to [facility] after Oct 16,2018 thru Apr 30, 2017 to get the vaginal bleeding checked out. Pt cannot see OB GYN in [facility] until Dec. 2018. Pt request cb.

## 2017-04-12 NOTE — Telephone Encounter (Signed)
Pt left v/m; pt is presently in [place] but before pt left pt was seen at [facility] health in [place], [place] due to vaginal bleeding and pt had had hyst. Ran dye in some type test and pt was advised to ck with Dr Reece Agar for results; pt was given keflex for vaginitis. Prior to that pt had been to health dept to be treated for vaginosis and trich which came back neg. Still waiting on gonorrhea and syphilis results. Pt was referred to [facility] clinic in [facility]; pt plans to call for that appt today. Pt request cb. Pt still has tenderness near naval and rt quad. FYI to Dr Reece Agar to know what was going on and does Dr Reece Agar have any results back from [facility] phone [phone]. Also [facility] alprazolam an diazepam.

## 2017-04-12 NOTE — Telephone Encounter (Signed)
All meds last printed:  03/17/17 oxycodone #90 Alprazolam #30 Diazepam #60  Last OV: 03/17/17 Next OV:  06/17/17

## 2017-04-14 NOTE — Telephone Encounter (Signed)
I reviewed all records but don't see where chlamydia or gonorrhea were checked.  Dx with yeast vaginitis and BV, treated with monistat and flagyl.  Recommend she take antibiotics until completion, this should help resolve her vaginal bleeding. If ongoing bleeding after antibiotics, let us know and we will place GYN referral.

## 2017-04-14 NOTE — Telephone Encounter (Signed)
Spoke with pt notifying her her rxs are ready to pick up. Placed rxs at front office.
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
## 2017-04-14 NOTE — Telephone Encounter (Signed)
Patient called back again. Patient can be reached at [phone].  [place] if she can't be reached leave a detailed message on her voice mail.

## 2017-04-14 NOTE — Telephone Encounter (Signed)
Printed and in CMA box 

## 2017-04-15 NOTE — Telephone Encounter (Signed)
Pt left v/m; pt still out of town but hopes to be home on 04/19/17. The tests are back and everything was negative; Chlamydia and gonorrhea were negative. Pt called health dept this morning [phone] spoke with [facility]; fax # is [phone]. Pt is still sore on rt side but no more vaginal bleeding. Pt still request GYN referral.

## 2017-04-16 ENCOUNTER — Other Ambulatory Visit: Payer: Self-pay

## 2017-04-16 MED ORDER — POLYETHYLENE GLYCOL 3350 17 GM/SCOOP PO POWD: ORAL | 0 refills | Status: DC

## 2017-04-19 ENCOUNTER — Encounter: Payer: Self-pay | Admitting: Family Medicine

## 2017-04-19 ENCOUNTER — Ambulatory Visit (INDEPENDENT_AMBULATORY_CARE_PROVIDER_SITE_OTHER): Payer: Medicare Other | Admitting: Family Medicine

## 2017-04-19 VITALS — BP 122/80 | HR 60 | Temp 98.6°F | Wt 192.5 lb

## 2017-04-19 DIAGNOSIS — N939 Abnormal uterine and vaginal bleeding, unspecified: Secondary | ICD-10-CM

## 2017-04-19 DIAGNOSIS — F319 Bipolar disorder, unspecified: Secondary | ICD-10-CM

## 2017-04-19 DIAGNOSIS — N952 Postmenopausal atrophic vaginitis: Secondary | ICD-10-CM | POA: Insufficient documentation

## 2017-04-19 DIAGNOSIS — E785 Hyperlipidemia, unspecified: Secondary | ICD-10-CM

## 2017-04-19 LAB — POC URINALSYSI DIPSTICK (AUTOMATED)
BILIRUBIN UA: NEGATIVE
Blood, UA: NEGATIVE
GLUCOSE UA: NEGATIVE
Ketones, UA: NEGATIVE
Leukocytes, UA: NEGATIVE
NITRITE UA: NEGATIVE
Protein, UA: NEGATIVE
SPEC GRAV UA: 1.02 (ref 1.010–1.025)
UROBILINOGEN UA: 0.2 U/dL
pH, UA: 6 (ref 5.0–8.0)

## 2017-04-19 MED ORDER — ATORVASTATIN CALCIUM 10 MG PO TABS
10.0000 mg | ORAL_TABLET | Freq: Every day | ORAL | 3 refills | Status: DC
Start: 2017-04-19 — End: 2017-08-30

## 2017-04-19 NOTE — Assessment & Plan Note (Signed)
Unclear story of large amt of brown vaginal discharge/bleeding s/p reassuring evaluation for STIs at health [facility] then [facility]. Reassured patient with results of normal contrasted CT abd/pelvis last month as well as labs (Cr, LFTs, CBC, etc). I will request latest [place] STI records to update chart. She has completed keflex course, as well as flagyl and monistat course. UA reassuringly normal today. No indication for further treatment at this time.  She has h/o cervical dysplasia s/p hysterectomy. Will refer to GYN for further eval.

## 2017-04-19 NOTE — Assessment & Plan Note (Signed)
Agrees to restart lipitor - refilled today. Discussed trial QOD if daily dose intolerable. Discussed nightly dosing.
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
## 2017-04-19 NOTE — Addendum Note (Signed)
Addended by: Nanci Pina on: 04/19/2017 04:01 PM   Modules accepted: Orders

## 2017-04-19 NOTE — Assessment & Plan Note (Signed)
Advised f/u with Dr Lolly Mustache. She will call to make appointment.

## 2017-04-19 NOTE — Telephone Encounter (Signed)
Will review today at OV.

## 2017-04-19 NOTE — Patient Instructions (Addendum)
We will request records from health department (fax [phone]) - sign release form up front.  Urinalysis today - normal.  We will refer you to gynecologist as well.

## 2017-04-26 DIAGNOSIS — M545 Low back pain: Secondary | ICD-10-CM | POA: Diagnosis not present

## 2017-04-26 DIAGNOSIS — M2550 Pain in unspecified joint: Secondary | ICD-10-CM | POA: Diagnosis not present

## 2017-04-26 DIAGNOSIS — G8929 Other chronic pain: Secondary | ICD-10-CM | POA: Diagnosis not present

## 2017-04-26 DIAGNOSIS — G894 Chronic pain syndrome: Secondary | ICD-10-CM | POA: Diagnosis not present

## 2017-04-26 DIAGNOSIS — M0600 Rheumatoid arthritis without rheumatoid factor, unspecified site: Secondary | ICD-10-CM | POA: Diagnosis not present

## 2017-04-28 ENCOUNTER — Other Ambulatory Visit: Payer: Self-pay | Admitting: Family Medicine

## 2017-04-28 DIAGNOSIS — F319 Bipolar disorder, unspecified: Secondary | ICD-10-CM

## 2017-04-28 NOTE — Telephone Encounter (Signed)
Aripiprazole last filled:  03/31/17, #30 Nitrofurantoin marco last filled:  03/31/17, #90 Last OV:  04/19/17 Next OV:  06/17/17

## 2017-04-29 NOTE — Telephone Encounter (Signed)
nitrofurantoin refilled. abilify will need to come through psych.

## 2017-05-03 ENCOUNTER — Ambulatory Visit (INDEPENDENT_AMBULATORY_CARE_PROVIDER_SITE_OTHER): Payer: Medicare Other | Admitting: Obstetrics & Gynecology

## 2017-05-03 ENCOUNTER — Other Ambulatory Visit (HOSPITAL_COMMUNITY)
Admission: RE | Admit: 2017-05-03 | Discharge: 2017-05-03 | Disposition: A | Discharge status: Home / Self Care | Payer: Medicare Other | Source: Ambulatory Visit | Attending: Obstetrics & Gynecology | Admitting: Obstetrics & Gynecology

## 2017-05-03 ENCOUNTER — Other Ambulatory Visit: Payer: Self-pay | Admitting: Family Medicine

## 2017-05-03 ENCOUNTER — Encounter: Payer: Self-pay | Admitting: Obstetrics & Gynecology

## 2017-05-03 VITALS — BP 119/79 | HR 87 | Ht 66.0 in | Wt 197.0 lb

## 2017-05-03 DIAGNOSIS — Z124 Encounter for screening for malignant neoplasm of cervix: Secondary | ICD-10-CM | POA: Diagnosis not present

## 2017-05-03 DIAGNOSIS — F319 Bipolar disorder, unspecified: Secondary | ICD-10-CM

## 2017-05-03 DIAGNOSIS — N952 Postmenopausal atrophic vaginitis: Secondary | ICD-10-CM | POA: Diagnosis not present

## 2017-05-03 DIAGNOSIS — Z1151 Encounter for screening for human papillomavirus (HPV): Secondary | ICD-10-CM

## 2017-05-03 DIAGNOSIS — N939 Abnormal uterine and vaginal bleeding, unspecified: Secondary | ICD-10-CM | POA: Insufficient documentation

## 2017-05-03 DIAGNOSIS — K5909 Other constipation: Secondary | ICD-10-CM | POA: Diagnosis not present

## 2017-05-03 DIAGNOSIS — K581 Irritable bowel syndrome with constipation: Secondary | ICD-10-CM

## 2017-05-03 DIAGNOSIS — Z113 Encounter for screening for infections with a predominantly sexual mode of transmission: Secondary | ICD-10-CM

## 2017-05-03 DIAGNOSIS — Z01419 Encounter for gynecological examination (general) (routine) without abnormal findings: Secondary | ICD-10-CM

## 2017-05-03 DIAGNOSIS — N898 Other specified noninflammatory disorders of vagina: Secondary | ICD-10-CM
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
## 2017-05-03 NOTE — Patient Instructions (Signed)
Return to clinic for any scheduled appointments or for any gynecologic concerns as needed.   

## 2017-05-04 ENCOUNTER — Encounter (HOSPITAL_COMMUNITY): Payer: Self-pay | Admitting: Psychiatry

## 2017-05-04 ENCOUNTER — Ambulatory Visit (INDEPENDENT_AMBULATORY_CARE_PROVIDER_SITE_OTHER): Payer: Medicare Other | Admitting: Psychiatry

## 2017-05-04 DIAGNOSIS — F319 Bipolar disorder, unspecified: Secondary | ICD-10-CM

## 2017-05-04 DIAGNOSIS — Z87891 Personal history of nicotine dependence: Secondary | ICD-10-CM

## 2017-05-04 DIAGNOSIS — M2550 Pain in unspecified joint: Secondary | ICD-10-CM

## 2017-05-04 DIAGNOSIS — Z810 Family history of intellectual disabilities: Secondary | ICD-10-CM

## 2017-05-04 DIAGNOSIS — Z560 Unemployment, unspecified: Secondary | ICD-10-CM

## 2017-05-04 DIAGNOSIS — Z811 Family history of alcohol abuse and dependence: Secondary | ICD-10-CM

## 2017-05-04 DIAGNOSIS — M549 Dorsalgia, unspecified: Secondary | ICD-10-CM

## 2017-05-04 DIAGNOSIS — F411 Generalized anxiety disorder: Secondary | ICD-10-CM | POA: Diagnosis not present

## 2017-05-04 DIAGNOSIS — Z818 Family history of other mental and behavioral disorders: Secondary | ICD-10-CM

## 2017-05-04 MED ORDER — ARIPIPRAZOLE 2 MG PO TABS: 2.0000 mg | ORAL_TABLET | Freq: Every day | ORAL | 0 refills | Status: DC
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
## 2017-05-07 ENCOUNTER — Telehealth: Payer: Self-pay | Admitting: Family Medicine

## 2017-05-07 LAB — CYTOLOGY - PAP
BACTERIAL VAGINITIS: NEGATIVE
CHLAMYDIA, DNA PROBE: NEGATIVE
Candida vaginitis: NEGATIVE
DIAGNOSIS: NEGATIVE
HPV: NOT DETECTED
NEISSERIA GONORRHEA: NEGATIVE
TRICH (WINDOWPATH): NEGATIVE

## 2017-05-07 NOTE — Telephone Encounter (Signed)
Left message on vm per dpr relaying message per Dr. G.  

## 2017-05-07 NOTE — Telephone Encounter (Signed)
Copied from CRM [id number]. Topic: Inquiry >> May 07, 2017 12:29 PM Viviann Spare wrote: Reason for CRM: Pt would like for Dr. Sharen Hones to give her a call. Pt stated that she would like to know if the doctor can write her a RX for estrogen. Pt says everything that she has tried is not working and making things worse. Pt did not want to talk to a triage nurse.

## 2017-05-07 NOTE — Telephone Encounter (Signed)
Recommend she f/u with GYN Dr Macon Large regarding hormone replacement therapy.

## 2017-05-10 DIAGNOSIS — M47816 Spondylosis without myelopathy or radiculopathy, lumbar region: Secondary | ICD-10-CM | POA: Diagnosis not present

## 2017-05-10 DIAGNOSIS — G894 Chronic pain syndrome: Secondary | ICD-10-CM | POA: Diagnosis not present

## 2017-05-10 DIAGNOSIS — M5417 Radiculopathy, lumbosacral region: Secondary | ICD-10-CM | POA: Diagnosis not present

## 2017-05-10 DIAGNOSIS — M5137 Other intervertebral disc degeneration, lumbosacral region: Secondary | ICD-10-CM | POA: Diagnosis not present

## 2017-05-18 ENCOUNTER — Other Ambulatory Visit: Payer: Self-pay

## 2017-05-18 DIAGNOSIS — N952 Postmenopausal atrophic vaginitis: Secondary | ICD-10-CM

## 2017-05-18 NOTE — Telephone Encounter (Signed)
Patient called stating she is having some more bleeding when she has sex and this messing up her sex life. She is requesting you call her in some estrogen pills not cream.. She reports she does not have a heart condition as report in the assessment when she was last seen in October. Can you prescribed her some her some medication to take? She uses [facility].

## 2017-05-19 ENCOUNTER — Other Ambulatory Visit: Payer: Self-pay | Admitting: Family Medicine

## 2017-05-19 NOTE — Telephone Encounter (Signed)
Copied from CRM [id number]. Topic: Quick Communication - See Telephone Encounter >> May 19, 2017  1:30 PM Windy Kalata, NT wrote: CRM for notification. See Telephone encounter for:   Pt is out of town and will be in 06/01/17 and the pharmacy told her she could pick RX up on the 06/01/17.   05/19/17.

## 2017-05-19 NOTE — Telephone Encounter (Signed)
Her problem is vulvovaginal atrophy and bleeding. The best way to treat this is prolonged use of vaginal estrogen compared to oral estrogen; it is more effective. I have seen that her heart problems were attributed to anxiety.  If she desires, we can prescribe Osphena (this helps more with atrophy). But my recommendation is to continue vaginal estrogen therapy.  Please call to inform patient of recommendations.   Jaynie Collins, MD, FACOG Attending Obstetrician & Gynecologist, [facility] for [employer], [facility]

## 2017-05-19 NOTE — Telephone Encounter (Signed)
Request already pending

## 2017-05-20 NOTE — Telephone Encounter (Signed)
Call patient- no answer or voice mail. 

## 2017-05-20 NOTE — Telephone Encounter (Signed)
Noted. Request was forwarded to Dr. Reece Agar and pt is aware he is out of the office.

## 2017-05-20 NOTE — Telephone Encounter (Signed)
Pt is aware rxs are not due to be refilled until 06/01/17 and that Dr. Reece Agar is out of the office until next week. Says she just wanted to make sure they will be ready.   Oxycodone/APAP last filled:  05/01/17, #90 Alprazolam last filled:  05/01/17, #30 Diazepam last filled:  05/01/17, #60 Last OV:  04/19/17 Next OV:  06/17/17

## 2017-05-24 NOTE — Telephone Encounter (Signed)
Patient is agreeable to continued vaginal estrogen therapy. Can you called this please send in the medications to apothecary.

## 2017-05-25 MED ORDER — ESTRADIOL 0.1 MG/GM VA CREA: TOPICAL_CREAM | VAGINAL | 12 refills | Status: DC

## 2017-05-25 NOTE — Telephone Encounter (Signed)
Estrace Rx sent.  Needs to follow up in one month.  Please schedule appointment for patient.   Jaynie Collins, MD, FACOG Attending Obstetrician & Gynecologist, [facility] for [employer], [facility]

## 2017-05-25 NOTE — Addendum Note (Signed)
Addended by: Jaynie Collins A on: 05/25/2017 04:46 PM   Modules accepted: Orders

## 2017-05-26 NOTE — Telephone Encounter (Addendum)
Left message on vm per dpr notifying pt rxs are ready to pick up. [Placed rxs at front office.]

## 2017-05-26 NOTE — Telephone Encounter (Signed)
Printed and in Lisa's box.  

## 2017-05-28 ENCOUNTER — Other Ambulatory Visit: Payer: Self-pay | Admitting: Family Medicine

## 2017-05-31 ENCOUNTER — Other Ambulatory Visit: Payer: Self-pay | Admitting: Family Medicine

## 2017-05-31 ENCOUNTER — Other Ambulatory Visit: Payer: Self-pay | Admitting: Internal Medicine

## 2017-05-31 ENCOUNTER — Other Ambulatory Visit: Payer: Self-pay

## 2017-05-31 DIAGNOSIS — R059 Cough, unspecified: Secondary | ICD-10-CM

## 2017-05-31 DIAGNOSIS — R05 Cough: Secondary | ICD-10-CM

## 2017-05-31 DIAGNOSIS — N952 Postmenopausal atrophic vaginitis: Secondary | ICD-10-CM

## 2017-05-31 MED ORDER — ESTRADIOL 0.1 MG/GM VA CREA: TOPICAL_CREAM | VAGINAL | 12 refills | Status: DC

## 2017-06-01 ENCOUNTER — Other Ambulatory Visit: Payer: Self-pay

## 2017-06-01 DIAGNOSIS — R059 Cough, unspecified: Secondary | ICD-10-CM

## 2017-06-01 DIAGNOSIS — R05 Cough: Secondary | ICD-10-CM

## 2017-06-01 MED ORDER — ALBUTEROL SULFATE HFA 108 (90 BASE) MCG/ACT IN AERS: INHALATION_SPRAY | RESPIRATORY_TRACT | 0 refills | Status: DC

## 2017-06-08 ENCOUNTER — Ambulatory Visit (INDEPENDENT_AMBULATORY_CARE_PROVIDER_SITE_OTHER): Payer: Medicare Other | Admitting: Gastroenterology

## 2017-06-08 ENCOUNTER — Telehealth: Payer: Self-pay

## 2017-06-08 ENCOUNTER — Encounter: Payer: Self-pay | Admitting: Gastroenterology

## 2017-06-08 VITALS — BP 125/84 | Temp 97.1°F | Ht 66.0 in | Wt 205.0 lb

## 2017-06-08 DIAGNOSIS — K5904 Chronic idiopathic constipation: Secondary | ICD-10-CM
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
## 2017-06-08 NOTE — Telephone Encounter (Signed)
Copied from CRM [id number]. Topic: Referral - Question >> Jun 08, 2017  3:25 PM Alexander Bergeron B wrote: Reason for CRM: pt is wanting to have another referral for a rheumatologist in [place], she is not going back to [place] contact pt if needed

## 2017-06-08 NOTE — Telephone Encounter (Signed)
Pt has been given samples of Linzess 290 during todays office visit with Dr. Allegra Lai.  Pt will call to let us know how samples are working and request a rx.  Thanks [employer]

## 2017-06-09 ENCOUNTER — Telehealth: Payer: Self-pay | Admitting: Gastroenterology

## 2017-06-09 NOTE — Telephone Encounter (Signed)
Patient left a voice message that the Linzess was working great and to please call in a RX to [facility]. [phone]

## 2017-06-10 ENCOUNTER — Other Ambulatory Visit: Payer: Self-pay

## 2017-06-10 ENCOUNTER — Telehealth: Payer: Self-pay

## 2017-06-10 DIAGNOSIS — M1990 Unspecified osteoarthritis, unspecified site: Secondary | ICD-10-CM

## 2017-06-10 DIAGNOSIS — M0600 Rheumatoid arthritis without rheumatoid factor, unspecified site: Secondary | ICD-10-CM

## 2017-06-10 MED ORDER — LINACLOTIDE 290 MCG PO CAPS: 290.0000 ug | ORAL_CAPSULE | Freq: Every day | ORAL | 3 refills | Status: DC

## 2017-06-10 NOTE — Telephone Encounter (Signed)
Copied from CRM [id number]. Topic: Referral - Request >> Jun 10, 2017  3:14 PM Yvonna Alanis wrote: Reason for CRM: Patient called requesting a referral from Dr. Reece Agar for Dr. Pollyann Savoy at [address] [phone] [facility] [phone]. Patient stated that she can get an appt in May 2019 if we send the referral soon. Patient would like to be called once referral has been sent, or if we have any questions. Thank You!!!

## 2017-06-10 NOTE — Telephone Encounter (Signed)
Rx for Linzess 290 has been sent to [facility].  Patient stated that she has noticed a big difference with her stomach.   Thanks [employer]

## 2017-06-14 NOTE — Telephone Encounter (Signed)
Referral placed. Will see if pt accepted by rheum. Pt does not want to return to [place] clinic.

## 2017-06-14 NOTE — Telephone Encounter (Signed)
See other note

## 2017-06-14 NOTE — Telephone Encounter (Signed)
Referral sent to Dr Corliss Skains and patient is aware.

## 2017-06-15 DIAGNOSIS — G894 Chronic pain syndrome: Secondary | ICD-10-CM | POA: Diagnosis not present

## 2017-06-15 DIAGNOSIS — M5416 Radiculopathy, lumbar region: Secondary | ICD-10-CM | POA: Diagnosis not present

## 2017-06-15 DIAGNOSIS — M25561 Pain in right knee: Secondary | ICD-10-CM | POA: Diagnosis not present

## 2017-06-15 DIAGNOSIS — G8929 Other chronic pain: Secondary | ICD-10-CM | POA: Diagnosis not present

## 2017-06-15 DIAGNOSIS — M0600 Rheumatoid arthritis without rheumatoid factor, unspecified site: Secondary | ICD-10-CM | POA: Diagnosis not present

## 2017-06-17 ENCOUNTER — Encounter: Payer: Self-pay | Admitting: Family Medicine

## 2017-06-17 ENCOUNTER — Ambulatory Visit (INDEPENDENT_AMBULATORY_CARE_PROVIDER_SITE_OTHER): Payer: Medicare Other | Admitting: Family Medicine

## 2017-06-17 ENCOUNTER — Telehealth: Payer: Self-pay

## 2017-06-17 VITALS — BP 122/70 | HR 83 | Temp 98.0°F | Wt 206.0 lb

## 2017-06-17 DIAGNOSIS — G894 Chronic pain syndrome: Secondary | ICD-10-CM | POA: Diagnosis not present

## 2017-06-17 DIAGNOSIS — M5432 Sciatica, left side: Secondary | ICD-10-CM

## 2017-06-17 DIAGNOSIS — B9789 Other viral agents as the cause of diseases classified elsewhere: Secondary | ICD-10-CM

## 2017-06-17 DIAGNOSIS — H6692 Otitis media, unspecified, left ear: Secondary | ICD-10-CM

## 2017-06-17 DIAGNOSIS — K5909 Other constipation: Secondary | ICD-10-CM

## 2017-06-17 DIAGNOSIS — K581 Irritable bowel syndrome with constipation: Secondary | ICD-10-CM

## 2017-06-17 DIAGNOSIS — G8929 Other chronic pain: Secondary | ICD-10-CM

## 2017-06-17 DIAGNOSIS — F411 Generalized anxiety disorder: Secondary | ICD-10-CM

## 2017-06-17 DIAGNOSIS — M0600 Rheumatoid arthritis without rheumatoid factor, unspecified site: Secondary | ICD-10-CM | POA: Diagnosis not present

## 2017-06-17 DIAGNOSIS — E669 Obesity, unspecified: Secondary | ICD-10-CM

## 2017-06-17 DIAGNOSIS — F319 Bipolar disorder, unspecified: Secondary | ICD-10-CM

## 2017-06-17 DIAGNOSIS — J069 Acute upper respiratory infection, unspecified: Secondary | ICD-10-CM

## 2017-06-17 DIAGNOSIS — M1990 Unspecified osteoarthritis, unspecified site: Secondary | ICD-10-CM

## 2017-06-17 DIAGNOSIS — R892 Abnormal level of other drugs, medicaments and biological substances in specimens from other organs, systems and tissues: Secondary | ICD-10-CM

## 2017-06-17 MED ORDER — OXYCODONE-ACETAMINOPHEN 7.5-325 MG PO TABS: 1.0000 | ORAL_TABLET | Freq: Three times a day (TID) | ORAL | 0 refills | Status: DC | PRN

## 2017-06-17 MED ORDER — POLYMYXIN B-TRIMETHOPRIM 10000-0.1 UNIT/ML-% OP SOLN: 1.0000 [drp] | Freq: Four times a day (QID) | OPHTHALMIC | 0 refills | Status: DC

## 2017-06-17 MED ORDER — ALPRAZOLAM 1 MG PO TABS
1.0000 mg | ORAL_TABLET | Freq: Every day | ORAL | 0 refills | Status: DC | PRN
Start: 2017-06-17 — End: 2017-07-21

## 2017-06-17 MED ORDER — ORLISTAT 120 MG PO CAPS: 120.0000 mg | ORAL_CAPSULE | Freq: Two times a day (BID) | ORAL | 3 refills | Status: DC

## 2017-06-17 MED ORDER — DICLOFENAC SODIUM 75 MG PO TBEC
75.0000 mg | DELAYED_RELEASE_TABLET | Freq: Two times a day (BID) | ORAL | 1 refills | Status: DC | PRN
Start: 2017-06-17 — End: 2017-07-04

## 2017-06-17 MED ORDER — DIAZEPAM 10 MG PO TABS: 10.0000 mg | ORAL_TABLET | Freq: Two times a day (BID) | ORAL | 0 refills | Status: DC

## 2017-06-17 NOTE — Patient Instructions (Addendum)
Update UDS today.  I recommend we continue to back off xanax. We will do #20 per month.  As pain has improved I recommend you back off oxycodone - use sparingly.  I think you have viral upper respiratory infection.  Trial orlistat with meals for weight loss.  Good to see you today. Return in 3 months for follow up visit.

## 2017-06-17 NOTE — Telephone Encounter (Signed)
Started PA for Xenical, key:  [id number], PA case ID:  PA- [id number], Rx #:  [id number]. Decision pending.

## 2017-06-17 NOTE — Assessment & Plan Note (Addendum)
Discussed recent abnormal UDS - negative for xanax. Update UDS today.

## 2017-06-17 NOTE — Progress Notes (Signed)
BP 122/70 (BP Location: Left Arm, Patient Position: Sitting, Cuff Size: Normal)   Pulse 83   Temp 98 F (36.7 C) (Oral)   Wt 206 lb (93.4 kg)   SpO2 94%   BMI 33.25 kg/m    CC: 3 mo f/u visit Subjective:    Patient ID: Kaylee [name], female    DOB: 1961/06/30, 56 y.o.   MRN: [id number]  HPI: Kaylee [name] is a 56 y.o. female presenting on 06/17/2017 for 3 mo follow-up (FYI, has appt with ortho for knee on 07/28/17); Diarrhea (Started today); Cough (Productive. Started 2 days ago. Has children at home with same sxs.); and Weight concern (Wants to discuss wt loss rx)   Sick contacts at home. Pt started with cough 2 days ago. Feels chest congestion. No ear or tooth pain, head congestion, fevers/chills. She has been taking decongestant.   Saw [facility] pain clinic 12/4 - note reviewed. She did receive significant relief from [facility] 05/2017. PT recommended - pt has not done this yet. Referred to orthopedics for knee pain.   Seronegative rheumatoid arthritis - desired new rheumatology referral which was placed last week.   Chronic constipation - established with Dr Allegra Lai - started on linzess for chronic constipation which has been helpful. Bentyl was discontinued. rec stopping all OTC supplements.   Bipolar 1 with GAD - saw Dr Lolly Mustache who started abilify - rec follow up with PCP for future refills.   Chronic pain - managed with oxycodone 7.5/325mg  TID  Obesity - requests trial weight loss medication. Activity limited by knee pain and back pain. Endorses good portion sizes. Drinks water. Fruits/vegetables daily.   Relevant past medical, surgical, family and social history reviewed and updated as indicated. Interim medical history since our last visit reviewed. Allergies and medications reviewed and updated. Outpatient Medications Prior to Visit  Medication Sig Dispense Refill  . acyclovir (ZOVIRAX) 400 MG tablet Take 1 tablet (400 mg total) by mouth 2 (two) times daily. 60 tablet 6  .  albuterol (PROAIR HFA) 108 (90 Base) MCG/ACT inhaler INHALE TWO PUFFS INTO THE LUNGS EVERY 4 (FOUR) HOURS AS NEEDED 8.5 g 0  . allopurinol (ZYLOPRIM) 100 MG tablet Take 1 tablet (100 mg total) by mouth daily. 30 tablet 6  . Alpha-Lipoic Acid 600 MG CAPS Take 1 capsule by mouth daily.    [name] amitriptyline (ELAVIL) 25 MG tablet Take 25 mg by mouth.    . ARIPiprazole (ABILIFY) 2 MG tablet Take 1 tablet (2 mg total) by mouth daily. 90 tablet 0  . atorvastatin (LIPITOR) 10 MG tablet Take 1 tablet (10 mg total) by mouth daily. 30 tablet 3  . colchicine 0.6 MG tablet     . esomeprazole (NEXIUM) 40 MG capsule TAKE 1 CAPSULE BY MOUTH AS NEEDED 90 capsule 0  . estradiol (ESTRACE) 0.1 MG/GM vaginal cream Apply 1 gram per vagina every night for 2 weeks, then apply three times a week 30 g 12  . fluticasone (FLONASE) 50 MCG/ACT nasal spray Place 2 sprays into both nostrils daily. 16 g 15  . Fluticasone-Salmeterol (ADVAIR DISKUS) 250-50 MCG/DOSE AEPB INHALE 1 PUFF BY MOUTH TWICE A DAY 60 each 11  . furosemide (LASIX) 20 MG tablet TAKE ONE (1) TABLET EACH DAY AS NEEDED 90 tablet 3  . glucosamine-chondroitin 500-400 MG tablet Take by mouth.    . hydroxychloroquine (PLAQUENIL) 200 MG tablet Take by mouth.    . hyoscyamine (LEVSIN, ANASPAZ) 0.125 MG tablet Take by mouth.    [name]  linaclotide (LINZESS) 290 MCG CAPS capsule Take 1 capsule (290 mcg total) by mouth daily before breakfast. 30 capsule 3  . magnesium oxide (MAG-OX) 400 MG tablet Take 400 mg by mouth daily.    . methocarbamol (ROBAXIN) 750 MG tablet Take 1 tablet (750 mg total) by mouth 2 (two) times daily as needed for muscle spasms. 60 tablet 3  . misoprostol (CYTOTEC) 200 MCG tablet Take by mouth.    . NEOMYCIN-POLYMYXIN-HYDROCORTISONE (CORTISPORIN) 1 % SOLN otic solution PLACE 3 DROPS INTO THE LEFT EAR 3 TIMES A DAY AS DIRECTED 10 mL 1  . omega-3 acid ethyl esters (LOVAZA) 1 g capsule TWO CAPSULES EACH DAY 60 capsule 6  . potassium chloride (K-DUR) 10 MEQ  tablet ONE TABLET EACH DAY AS NEEDED 90 tablet 3  . tiotropium (SPIRIVA HANDIHALER) 18 MCG inhalation capsule Place 1 capsule (18 mcg total) into inhaler and inhale daily. 90 capsule 3  . triamcinolone cream (KENALOG) 0.1 % Apply 1 application topically 2 (two) times daily. Apply to AA. 30 g 0  . ALPRAZolam (XANAX) 1 MG tablet TAKE 1 TABLET BY MOUTH DAILY 30 tablet 0  . diazepam (VALIUM) 10 MG tablet TAKE 1 TABLET BY MOUTH TWICE A DAY 60 tablet 0  . diclofenac (VOLTAREN) 75 MG EC tablet Take 75 mg by mouth.    . neomycin-polymyxin b-dexamethasone (MAXITROL) 3.5-10000-0.1 SUSP PUT ONE DROP INTO THE LEFT EYE AS NEEDED 5 mL 1  . oxyCODONE-acetaminophen (PERCOCET) 7.5-325 MG tablet TAKE 1 TABLET BY MOUTH EVERY 8 HOURS AS NEEDED FOR PAIN 90 tablet 0  . pantoprazole (PROTONIX) 20 MG tablet Take 20 mg by mouth.    [name] aspirin EC 81 MG tablet Take by mouth.    [name] b complex vitamins tablet Take 1 tablet by mouth daily.    . cetirizine (ZYRTEC) 10 MG tablet Take 10 mg by mouth.    . lansoprazole (PREVACID) 30 MG capsule Take by mouth.     No facility-administered medications prior to visit.      Per HPI unless specifically indicated in ROS section below Review of Systems     Objective:    BP 122/70 (BP Location: Left Arm, Patient Position: Sitting, Cuff Size: Normal)   Pulse 83   Temp 98 F (36.7 C) (Oral)   Wt 206 lb (93.4 kg)   SpO2 94%   BMI 33.25 kg/m   Wt Readings from Last 3 Encounters:  06/17/17 206 lb (93.4 kg)  06/08/17 205 lb (93 kg)  05/03/17 197 lb (89.4 kg)    Physical Exam  Constitutional: She appears well-developed and well-nourished. No distress.  HENT:  Head: Normocephalic and atraumatic.  Right Ear: Hearing, tympanic membrane, external ear and ear canal normal.  Left Ear: Hearing, external ear and ear canal normal.  Nose: No mucosal edema or rhinorrhea. Right sinus exhibits no maxillary sinus tenderness and no frontal sinus tenderness. Left sinus exhibits no maxillary  sinus tenderness and no frontal sinus tenderness.  Mouth/Throat: Uvula is midline, oropharynx is clear and moist and mucous membranes are normal. No oropharyngeal exudate, posterior oropharyngeal edema, posterior oropharyngeal erythema or tonsillar abscesses.  Chronic scarring L TM without erythema or drainage  Eyes: Conjunctivae and EOM are normal. Pupils are equal, round, and reactive to light. No scleral icterus.  Neck: Normal range of motion. Neck supple. No thyromegaly present.  Cardiovascular: Normal rate, regular rhythm, normal heart sounds and intact distal pulses.  No murmur heard. Pulmonary/Chest: Effort normal and breath sounds normal. No respiratory  distress. She has no wheezes. She has no rales.  Musculoskeletal: She exhibits no edema.  Lymphadenopathy:    She has no cervical adenopathy.  Skin: Skin is warm and dry. No rash noted.  Psychiatric: She has a normal mood and affect.  Nursing note and vitals reviewed.  Results for orders placed or performed in visit on 05/03/17  Cytology - PAP  Result Value Ref Range   Adequacy      Satisfactory for evaluation  endocervical/transformation zone component PRESENT.   Diagnosis      NEGATIVE FOR INTRAEPITHELIAL LESIONS OR MALIGNANCY.   HPV NOT DETECTED    Chlamydia Negative    Neisseria gonorrhea Negative    Bacterial vaginitis Negative for Bacterial Vaginitis Microorganisms    Candida vaginitis Negative for Candida species    Trichomonas Negative    Material Submitted Vaginal Pap [ThinPrep Imaged]    CYTOLOGY - PAP PAP RESULT       Assessment & Plan:  Refilled polytrim eye drops.  Problem List Items Addressed This Visit    Abnormal drug screen    Discussed recent abnormal UDS - negative for xanax. Update UDS today.       Relevant Orders   Pain Mgmt, Profile 8 w/Conf, U   Back pain with left-sided sciatica    Marked improvement after lumbar ESI - planned fu with [facility] PM[facility]      Relevant Medications   ALPRAZolam (XANAX)  1 MG tablet   diazepam (VALIUM) 10 MG tablet   oxyCODONE-acetaminophen (PERCOCET) 7.5-325 MG tablet   orlistat (XENICAL) 120 MG capsule   diclofenac (VOLTAREN) 75 MG EC tablet   Other Relevant Orders   Pain Mgmt, Profile 8 w/Conf, U   Bipolar 1 disorder (HCC)    Reviewed latest psychiatry note - planned f/u with PCP for medication management. Pt states she desires to stop abilify, states not noticing effect. Recommended against this as I do think she has benefited from medication and has intolerances to several other medications. She states she will try QOD dosing.       Chronic constipation    Appreciate GI care. Has established with Dr Allegra Lai of [facility]. Linzess helpful.       Chronic inflammatory arthritis    Pt states colchicine and allopurinol have been helpful.       Relevant Medications   oxyCODONE-acetaminophen (PERCOCET) 7.5-325 MG tablet   diclofenac (VOLTAREN) 75 MG EC tablet   Chronic otitis media of left ear    Endorses h/o this. No signs of active infection at this time. rec against abx ear drops today.       Chronic pain syndrome   Relevant Orders   Pain Mgmt, Profile 8 w/Conf, U   Encounter for chronic pain management - Primary    [place] CSRS reviewed.  Narcotic indication: chronic lumbar and knee pain from OA and RA.  Pt endorses pain better controlled since ESI, but has ongoing knee pain pending ortho evaluation.  We discussed decreased narcotic use as pain is improving. Update UDS today.  RTC 3 mo narcotic management visit.       Relevant Orders   Pain Mgmt, Profile 8 w/Conf, U   GAD (generalized anxiety disorder)    H/o severe anxiety managed with abilify, amitriptyline, valium 10mg  bid and xanax 1mg  daily PRN. Discussed continuing to decrease xanax use - pt states she is actually using PRN. Will decrease xanax to 20 tablets monthly with goal to come of medication.  Relevant Medications   ALPRAZolam (XANAX) 1 MG tablet   diazepam (VALIUM) 10 MG  tablet   Irritable bowel syndrome with constipation    Linzess effective.       Obesity, Class I, BMI 30-34.9    Discussed pharmacotherapy. Recommend against stimulants or psychotropics.  Discussed orlistat mechanism of action - pt desires to trial this.  Would also consider saxenda.       Seronegative rheumatoid arthritis (HCC)    Pending eval by new rheum.  She has stopped plaquenil. Does not desire to return to [place].       Relevant Medications   oxyCODONE-acetaminophen (PERCOCET) 7.5-325 MG tablet   diclofenac (VOLTAREN) 75 MG EC tablet   Viral URI with cough    Anticipate viral given short duration. Recommended against abx at this time. supportive care reviewed.           Follow up plan: Return in about 3 months (around 09/15/2017), or if symptoms worsen or fail to improve, for follow up visit.  [name], MD

## 2017-06-17 NOTE — Assessment & Plan Note (Addendum)
Pending eval by new rheum.  She has stopped plaquenil. Does not desire to return to [place] orth[facility].

## 2017-06-17 NOTE — Assessment & Plan Note (Addendum)
[place] CSRS reviewed.  Narcotic indication: chronic lumbar and knee pain from OA and RA.  Pt endorses pain better controlled since ESI, but has ongoing knee pain pending ortho evaluation.  We discussed decreased narcotic use as pain is improving. Update UDS today.  RTC 3 mo narcotic management visit.

## 2017-06-19 DIAGNOSIS — R059 Cough, unspecified: Secondary | ICD-10-CM | POA: Insufficient documentation

## 2017-06-19 DIAGNOSIS — R05 Cough: Secondary | ICD-10-CM | POA: Insufficient documentation

## 2017-06-19 NOTE — Assessment & Plan Note (Signed)
Anticipate viral given short duration. Recommended against abx at this time. supportive care reviewed.

## 2017-06-19 NOTE — Assessment & Plan Note (Signed)
Linzess effective.

## 2017-06-19 NOTE — Assessment & Plan Note (Signed)
Endorses h/o this. No signs of active infection at this time. rec against abx ear drops today.

## 2017-06-19 NOTE — Assessment & Plan Note (Signed)
Discussed pharmacotherapy. Recommend against stimulants or psychotropics.  Discussed orlistat mechanism of action - pt desires to trial this.  Would also consider saxenda.

## 2017-06-19 NOTE — Assessment & Plan Note (Addendum)
Pt states colchicine and allopurinol have been helpful.

## 2017-06-19 NOTE — Assessment & Plan Note (Signed)
Reviewed latest psychiatry note - planned f/u with PCP for medication management. Pt states she desires to stop abilify, states not noticing effect. Recommended against this as I do think she has benefited from medication and has intolerances to several other medications. She states she will try QOD dosing.

## 2017-06-19 NOTE — Assessment & Plan Note (Signed)
Appreciate GI care. Has established with Dr Allegra Lai of [facility]. Linzess helpful.

## 2017-06-19 NOTE — Assessment & Plan Note (Addendum)
Marked improvement after lumbar ESI - planned fu with [facility] PM&R

## 2017-06-19 NOTE — Assessment & Plan Note (Addendum)
H/o severe anxiety managed with abilify, amitriptyline, valium 10mg  bid and xanax 1mg  daily PRN. Discussed continuing to decrease xanax use - pt states she is actually using PRN. Will decrease xanax to 20 tablets monthly with goal to come of medication.

## 2017-06-21 LAB — PAIN MGMT, PROFILE 8 W/CONF, U
6 ACETYLMORPHINE: NEGATIVE ng/mL (ref <10)
ALCOHOL METABOLITES: NEGATIVE ng/mL (ref <500)
AMINOCLONAZEPAM: NEGATIVE ng/mL (ref <25)
Alphahydroxyalprazolam: 25 ng/mL — ABNORMAL HIGH (ref <25)
Alphahydroxymidazolam: NEGATIVE ng/mL (ref <50)
Alphahydroxytriazolam: NEGATIVE ng/mL (ref <50)
Amphetamines: NEGATIVE ng/mL (ref <500)
Benzodiazepines: POSITIVE ng/mL — AB (ref <100)
Buprenorphine, Urine: NEGATIVE ng/mL (ref <5)
COCAINE METABOLITE: NEGATIVE ng/mL (ref <150)
CODEINE: NEGATIVE ng/mL (ref <50)
CREATININE: 123.3 mg/dL
HYDROMORPHONE: NEGATIVE ng/mL (ref <50)
Hydrocodone: NEGATIVE ng/mL (ref <50)
Hydroxyethylflurazepam: NEGATIVE ng/mL (ref <50)
LORAZEPAM: NEGATIVE ng/mL (ref <50)
MDMA: NEGATIVE ng/mL (ref <500)
Marijuana Metabolite: NEGATIVE ng/mL (ref <20)
Morphine: NEGATIVE ng/mL (ref <50)
NOROXYCODONE: 2390 ng/mL — AB (ref <50)
Nordiazepam: >1000 ng/mL — ABNORMAL HIGH (ref <50)
Norhydrocodone: NEGATIVE ng/mL (ref <50)
OXYCODONE: 1355 ng/mL — AB (ref <50)
OXYCODONE: POSITIVE ng/mL — AB (ref <100)
Opiates: NEGATIVE ng/mL (ref <100)
Oxazepam: >1000 ng/mL — ABNORMAL HIGH (ref <50)
Oxidant: NEGATIVE ug/mL (ref <200)
Oxymorphone: 1000 ng/mL — ABNORMAL HIGH (ref <50)
PH: 5.42 (ref 4.5–9.0)

## 2017-06-22 ENCOUNTER — Other Ambulatory Visit: Payer: Self-pay | Admitting: Family Medicine

## 2017-06-23 NOTE — Telephone Encounter (Signed)
Last rx:  01/22/16, #60 Last OV:  06/17/17 Next OV:  09/15/17

## 2017-06-24 NOTE — Telephone Encounter (Signed)
plz notify pt this was denied. Don't recommend other medication at this time.

## 2017-06-24 NOTE — Telephone Encounter (Signed)
Spoke with pt relaying message per Dr. G. 

## 2017-06-24 NOTE — Telephone Encounter (Signed)
PA denied.

## 2017-06-27 NOTE — Telephone Encounter (Signed)
I don't recommend she use this med anymore - just recommend continued nexium for stomach protection when taking anti inflammatories.

## 2017-06-28 NOTE — Telephone Encounter (Signed)
Spoke with pt relaying message per Dr. Anne Ng the pharmacist suggested etodolac.  Which pt says she had an old rx ( 400 mg tab, BID) from Dr. Reece Agar she has been taking and it is working great.  Pt is requesting new rx for this instead.  Says she is not trying to tell Dr. Reece Agar what to do but it is working so good.

## 2017-06-29 ENCOUNTER — Telehealth: Payer: Self-pay

## 2017-06-29 NOTE — Telephone Encounter (Signed)
Patient stopped by the office stated that IBGuard is working and [employer] is working.  She has been seeing improvements, however she did not have money at this time due to tight finances.  I've given her samples of Linzess 290  2 boxes and IBGuard

## 2017-06-30 ENCOUNTER — Telehealth: Payer: Self-pay | Admitting: Family Medicine

## 2017-06-30 ENCOUNTER — Other Ambulatory Visit: Payer: Self-pay | Admitting: Family Medicine

## 2017-06-30 NOTE — Telephone Encounter (Addendum)
Last filled:  10/01/16, #2 Last OV:  06/17/17 Next OV:  none

## 2017-06-30 NOTE — Telephone Encounter (Signed)
Copied from CRM [id number]. Topic: Quick Communication - Rx Refill/Question >> Jun 30, 2017  8:15 AM Gerrianne Scale wrote: Has the patient contacted their pharmacy? Yes.     (Agent: If no, request that the patient contact the pharmacy for the refill.) pt would like diflucan for yeast infection she has tried to use Monistat and it didn't work   Manufacturing systems engineer (with phone number or street name): [facility] [place], [place] - [address] [phone] (Phone) [phone] (Fax)     Agent: Please be advised that RX refills may take up to 3 business days. We ask that you follow-up with your pharmacy.

## 2017-06-30 NOTE — Telephone Encounter (Signed)
plz notify this was sent in. 

## 2017-06-30 NOTE — Telephone Encounter (Signed)
Spoke with pt notifying her rx was sent to pharmacy.  Expresses her thanks. 

## 2017-06-30 NOTE — Telephone Encounter (Signed)
Pt. Called and c/o yeast infection and would like Diflucan pill called. She is using a vaginal cream which interferes with the Monostat cream. Would like to avoid an office visit because of cost if possible. Also is having an epidural for back pain the first of the year.

## 2017-07-01 ENCOUNTER — Other Ambulatory Visit: Payer: Self-pay | Admitting: Family Medicine

## 2017-07-01 DIAGNOSIS — R059 Cough, unspecified: Secondary | ICD-10-CM

## 2017-07-01 DIAGNOSIS — R05 Cough: Secondary | ICD-10-CM

## 2017-07-02 ENCOUNTER — Telehealth: Payer: Self-pay

## 2017-07-02 NOTE — Telephone Encounter (Signed)
Received faxed refilll request for etodolac from [facility].  However, I do not see it on med list.

## 2017-07-04 MED ORDER — ETODOLAC 400 MG PO TABS: 400.0000 mg | ORAL_TABLET | Freq: Two times a day (BID) | ORAL | 3 refills | Status: DC | PRN

## 2017-07-04 NOTE — Telephone Encounter (Signed)
Will trial this. Sent in.

## 2017-07-04 NOTE — Telephone Encounter (Signed)
Will trial etodolac.

## 2017-07-16 DIAGNOSIS — M5417 Radiculopathy, lumbosacral region: Secondary | ICD-10-CM | POA: Diagnosis not present

## 2017-07-16 DIAGNOSIS — M47816 Spondylosis without myelopathy or radiculopathy, lumbar region: Secondary | ICD-10-CM | POA: Diagnosis not present

## 2017-07-16 DIAGNOSIS — T1490XS Injury, unspecified, sequela: Secondary | ICD-10-CM | POA: Diagnosis not present

## 2017-07-21 ENCOUNTER — Other Ambulatory Visit: Payer: Self-pay | Admitting: Family Medicine

## 2017-07-21 NOTE — Telephone Encounter (Signed)
Oxycodone last filled:  07/01/17, #90 Diazepam last filled:  07/01/17, 60 Alprazolam last filled:  #15 Last OV:  06/17/17 Next OV:  09/15/17

## 2017-07-23 NOTE — Telephone Encounter (Signed)
Sent electronically 

## 2017-07-26 ENCOUNTER — Telehealth: Payer: Self-pay | Admitting: Gastroenterology

## 2017-07-26 NOTE — Telephone Encounter (Signed)
Patient called in and l/m stating she was cancelling appt with Dr Dorene Sorrow. I see know appointment past or present for this patient.She was also asking for medicine be called in. I left a message for her to call if we need to schedule an appt for her & have her pharmacy to call or send in refill.

## 2017-07-27 ENCOUNTER — Ambulatory Visit: Payer: Medicare Other | Admitting: Gastroenterology

## 2017-07-28 DIAGNOSIS — M1712 Unilateral primary osteoarthritis, left knee: Secondary | ICD-10-CM | POA: Diagnosis not present

## 2017-07-28 DIAGNOSIS — M25561 Pain in right knee: Secondary | ICD-10-CM | POA: Diagnosis not present

## 2017-07-28 DIAGNOSIS — M170 Bilateral primary osteoarthritis of knee: Secondary | ICD-10-CM | POA: Diagnosis not present

## 2017-07-28 DIAGNOSIS — M1711 Unilateral primary osteoarthritis, right knee: Secondary | ICD-10-CM | POA: Diagnosis not present

## 2017-07-30 ENCOUNTER — Other Ambulatory Visit (HOSPITAL_COMMUNITY): Payer: Self-pay | Admitting: Psychiatry

## 2017-07-30 ENCOUNTER — Other Ambulatory Visit: Payer: Self-pay | Admitting: Family Medicine

## 2017-07-30 DIAGNOSIS — F319 Bipolar disorder, unspecified: Secondary | ICD-10-CM

## 2017-08-03 NOTE — Telephone Encounter (Signed)
Last filled:  07/01/17, #60 Last OV:  06/17/17 Next OV:  09/15/17

## 2017-08-14 ENCOUNTER — Telehealth: Payer: Self-pay | Admitting: Family Medicine

## 2017-08-16 ENCOUNTER — Telehealth: Payer: Self-pay | Admitting: Family Medicine

## 2017-08-16 NOTE — Telephone Encounter (Signed)
Copied from CRM [id number]. Topic: Quick Communication - See Telephone Encounter >> Aug 16, 2017 11:58 AM Rudi Coco, NT wrote: CRM for notification. See Telephone encounter for:   08/16/17. Logan from [facility] called with question about pt. Rx. For Nexium (as written or daily). Logan can be reached at [phone]  [facility] [address] [place], [place] - [address] [place] [address] Phone: [phone] Fax: [phone]

## 2017-08-16 NOTE — Telephone Encounter (Signed)
Oxycodone last filled:  07/31/17, #90 Alprazolam last filled:  07/31/17, #60 Last OV:  06/17/17 Next OV:  09/15/17

## 2017-08-16 NOTE — Telephone Encounter (Signed)
Spoke with Whitney Post at [facility] informing him the pt is to take 1 cap PO daily PRN.  Says ok and that was all he needed to fill rx.

## 2017-08-18 MED ORDER — DIAZEPAM 10 MG PO TABS: 10.0000 mg | ORAL_TABLET | Freq: Two times a day (BID) | ORAL | 0 refills | Status: DC

## 2017-08-18 NOTE — Telephone Encounter (Signed)
This should be BID PRN Last month filled #15. Tapering off med.

## 2017-08-18 NOTE — Telephone Encounter (Signed)
sent electronically to fill 08/31/2017 Continue taper down on xanax.

## 2017-08-18 NOTE — Telephone Encounter (Signed)
Caller name: Soundra Pilon with [facility] Can be reached: [phone]  Reason for call: please call to clarify qty on alprazolam 1mg  - written for 1 dose 2/day qty #10

## 2017-08-19 NOTE — Telephone Encounter (Signed)
Spoke with Kaylee [name] confirming the #60 was a one time fill, she says yes.  I relayed instructions per Dr. Rush Barer says she will adjust the rx sent this month for #10 to fill #15. But she will need a new rx #10 for the next fill.  Says she will also make pt aware of the issue and explain the changes.  Again, [employer] for the mistake.

## 2017-08-19 NOTE — Telephone Encounter (Addendum)
I had sent in #30 xanax since 08/2016 then tapered down to #15 in last few months. Has pt been getting #60 all along or #30s and just 60 by accident last month? If just 1 time mistake, let's do #15 this month instead of #10.  Thanks and thank her for letting us know.

## 2017-08-19 NOTE — Telephone Encounter (Addendum)
a 

## 2017-08-19 NOTE — Telephone Encounter (Signed)
Harriett Sine, pharmacist at [facility], called back to explain the reason for her mistake.  Says the pt was loud and talked the whole time she was filling all of her rxs.  Harriett Sine says that is by no means an excuse. But she is so upset about the mistake and just wanted to relay that to Dr. Sharen Hones.  Says he is more than welcome to call her at [phone] to discuss if needed.  And she will do anything he says to do to try and rectify the situation.

## 2017-08-19 NOTE — Telephone Encounter (Signed)
Spoke with Harriett Sine at pharmacy relaying message per Dr. Reece Agar.  After checking, she has realized she accidentally overlooked the qty and pt was issued #60/0.  Says she can issue #15/0 this refill and hold the #10/0 rx for next refill if Dr. Reece Agar is ok with that.  Harriett Sine sincerely apologizes for not seeing the qty for last month's refill.

## 2017-08-25 DIAGNOSIS — M7121 Synovial cyst of popliteal space [Baker], right knee: Secondary | ICD-10-CM | POA: Diagnosis not present

## 2017-08-25 DIAGNOSIS — M25461 Effusion, right knee: Secondary | ICD-10-CM | POA: Diagnosis not present

## 2017-08-25 DIAGNOSIS — S83241A Other tear of medial meniscus, current injury, right knee, initial encounter: Secondary | ICD-10-CM | POA: Diagnosis not present

## 2017-08-25 DIAGNOSIS — G8929 Other chronic pain: Secondary | ICD-10-CM | POA: Diagnosis not present

## 2017-08-25 DIAGNOSIS — M25561 Pain in right knee: Secondary | ICD-10-CM | POA: Diagnosis not present

## 2017-08-26 ENCOUNTER — Other Ambulatory Visit: Payer: Self-pay

## 2017-08-26 DIAGNOSIS — F319 Bipolar disorder, unspecified: Secondary | ICD-10-CM

## 2017-08-26 NOTE — Telephone Encounter (Signed)
JG-Plz see refill req/thx dmf 

## 2017-08-28 MED ORDER — ARIPIPRAZOLE 2 MG PO TABS: 2.0000 mg | ORAL_TABLET | Freq: Every day | ORAL | 0 refills | Status: DC

## 2017-08-28 NOTE — Telephone Encounter (Signed)
She discussed with psych for PCP to continue medications. Will refill.

## 2017-08-30 ENCOUNTER — Other Ambulatory Visit: Payer: Self-pay | Admitting: Family Medicine

## 2017-09-14 DIAGNOSIS — R0602 Shortness of breath: Secondary | ICD-10-CM | POA: Diagnosis not present

## 2017-09-14 DIAGNOSIS — J441 Chronic obstructive pulmonary disease with (acute) exacerbation: Secondary | ICD-10-CM | POA: Diagnosis not present

## 2017-09-14 DIAGNOSIS — N189 Chronic kidney disease, unspecified: Secondary | ICD-10-CM | POA: Diagnosis not present

## 2017-09-14 DIAGNOSIS — R6883 Chills (without fever): Secondary | ICD-10-CM | POA: Diagnosis not present

## 2017-09-14 DIAGNOSIS — Z79899 Other long term (current) drug therapy: Secondary | ICD-10-CM | POA: Diagnosis not present

## 2017-09-14 DIAGNOSIS — R51 Headache: Secondary | ICD-10-CM | POA: Diagnosis not present

## 2017-09-14 DIAGNOSIS — R05 Cough: Secondary | ICD-10-CM | POA: Diagnosis not present

## 2017-09-14 DIAGNOSIS — I129 Hypertensive chronic kidney disease with stage 1 through stage 4 chronic kidney disease, or unspecified chronic kidney disease: Secondary | ICD-10-CM | POA: Diagnosis not present

## 2017-09-14 DIAGNOSIS — I252 Old myocardial infarction: Secondary | ICD-10-CM | POA: Diagnosis not present

## 2017-09-14 DIAGNOSIS — M797 Fibromyalgia: Secondary | ICD-10-CM | POA: Diagnosis not present

## 2017-09-14 DIAGNOSIS — Z87891 Personal history of nicotine dependence: Secondary | ICD-10-CM | POA: Diagnosis not present

## 2017-09-14 DIAGNOSIS — Z882 Allergy status to sulfonamides status: Secondary | ICD-10-CM | POA: Diagnosis not present

## 2017-09-14 DIAGNOSIS — J069 Acute upper respiratory infection, unspecified: Secondary | ICD-10-CM | POA: Diagnosis not present

## 2017-09-15 ENCOUNTER — Ambulatory Visit: Payer: Medicare Other | Admitting: Family Medicine

## 2017-09-23 ENCOUNTER — Encounter: Payer: Self-pay | Admitting: Family Medicine

## 2017-09-23 ENCOUNTER — Ambulatory Visit (INDEPENDENT_AMBULATORY_CARE_PROVIDER_SITE_OTHER): Payer: Medicare Other | Admitting: Family Medicine

## 2017-09-23 ENCOUNTER — Other Ambulatory Visit: Payer: Self-pay | Admitting: Family Medicine

## 2017-09-23 VITALS — BP 124/76 | HR 92 | Temp 98.1°F | Wt 201.0 lb

## 2017-09-23 DIAGNOSIS — F319 Bipolar disorder, unspecified: Secondary | ICD-10-CM

## 2017-09-23 DIAGNOSIS — E669 Obesity, unspecified: Secondary | ICD-10-CM

## 2017-09-23 DIAGNOSIS — K581 Irritable bowel syndrome with constipation: Secondary | ICD-10-CM

## 2017-09-23 DIAGNOSIS — G8929 Other chronic pain: Secondary | ICD-10-CM

## 2017-09-23 DIAGNOSIS — R05 Cough: Secondary | ICD-10-CM

## 2017-09-23 DIAGNOSIS — R892 Abnormal level of other drugs, medicaments and biological substances in specimens from other organs, systems and tissues: Secondary | ICD-10-CM

## 2017-09-23 DIAGNOSIS — G894 Chronic pain syndrome: Secondary | ICD-10-CM | POA: Diagnosis not present

## 2017-09-23 DIAGNOSIS — K5909 Other constipation: Secondary | ICD-10-CM | POA: Diagnosis not present

## 2017-09-23 DIAGNOSIS — F411 Generalized anxiety disorder: Secondary | ICD-10-CM

## 2017-09-23 DIAGNOSIS — N952 Postmenopausal atrophic vaginitis: Secondary | ICD-10-CM | POA: Diagnosis not present

## 2017-09-23 DIAGNOSIS — M0600 Rheumatoid arthritis without rheumatoid factor, unspecified site: Secondary | ICD-10-CM | POA: Diagnosis not present

## 2017-09-23 DIAGNOSIS — Z87891 Personal history of nicotine dependence: Secondary | ICD-10-CM | POA: Diagnosis not present

## 2017-09-23 DIAGNOSIS — E66811 Obesity, class 1: Secondary | ICD-10-CM

## 2017-09-23 DIAGNOSIS — R059 Cough, unspecified: Secondary | ICD-10-CM

## 2017-09-23 MED ORDER — FLUCONAZOLE 150 MG PO TABS: ORAL_TABLET | ORAL | 0 refills | Status: DC

## 2017-09-23 MED ORDER — FUROSEMIDE 20 MG PO TABS: ORAL_TABLET | ORAL | 3 refills | Status: DC

## 2017-09-23 MED ORDER — ESTRADIOL 0.1 MG/GM VA CREA: TOPICAL_CREAM | VAGINAL | 12 refills | Status: DC

## 2017-09-23 MED ORDER — METHOCARBAMOL 750 MG PO TABS: ORAL_TABLET | ORAL | 3 refills | Status: DC

## 2017-09-23 MED ORDER — COLCHICINE 0.6 MG PO TABS: 0.6000 mg | ORAL_TABLET | Freq: Every day | ORAL | 2 refills | Status: DC | PRN

## 2017-09-23 MED ORDER — DIAZEPAM 10 MG PO TABS: 10.0000 mg | ORAL_TABLET | Freq: Two times a day (BID) | ORAL | 0 refills | Status: DC

## 2017-09-23 MED ORDER — PREDNISONE 20 MG PO TABS: ORAL_TABLET | ORAL | 0 refills | Status: DC

## 2017-09-23 MED ORDER — TIOTROPIUM BROMIDE MONOHYDRATE 18 MCG IN CAPS: 18.0000 ug | ORAL_CAPSULE | Freq: Every day | RESPIRATORY_TRACT | 3 refills | Status: DC

## 2017-09-23 MED ORDER — ALBUTEROL SULFATE HFA 108 (90 BASE) MCG/ACT IN AERS: INHALATION_SPRAY | RESPIRATORY_TRACT | 3 refills | Status: DC

## 2017-09-23 MED ORDER — POTASSIUM CHLORIDE ER 10 MEQ PO TBCR: EXTENDED_RELEASE_TABLET | ORAL | 3 refills | Status: DC

## 2017-09-23 MED ORDER — GLUCOSAMINE-CHONDROITIN 500-400 MG PO TABS: 2.0000 | ORAL_TABLET | Freq: Every day | ORAL | Status: DC

## 2017-09-23 MED ORDER — ALLOPURINOL 100 MG PO TABS: 100.0000 mg | ORAL_TABLET | Freq: Every day | ORAL | 3 refills | Status: DC

## 2017-09-23 MED ORDER — OMEGA-3-ACID ETHYL ESTERS 1 G PO CAPS: ORAL_CAPSULE | ORAL | 11 refills | Status: DC

## 2017-09-23 MED ORDER — OXYCODONE-ACETAMINOPHEN 7.5-325 MG PO TABS: 1.0000 | ORAL_TABLET | Freq: Three times a day (TID) | ORAL | 0 refills | Status: DC | PRN

## 2017-09-23 MED ORDER — ACYCLOVIR 400 MG PO TABS: 400.0000 mg | ORAL_TABLET | Freq: Two times a day (BID) | ORAL | 2 refills | Status: DC

## 2017-09-23 MED ORDER — ORLISTAT 60 MG PO CAPS: 60.0000 mg | ORAL_CAPSULE | Freq: Two times a day (BID) | ORAL | Status: DC

## 2017-09-23 MED ORDER — LINACLOTIDE 290 MCG PO CAPS: 290.0000 ug | ORAL_CAPSULE | Freq: Every day | ORAL | 11 refills | Status: DC

## 2017-09-23 MED ORDER — ATORVASTATIN CALCIUM 10 MG PO TABS: 10.0000 mg | ORAL_TABLET | Freq: Every day | ORAL | 3 refills | Status: DC

## 2017-09-23 MED ORDER — HYOSCYAMINE SULFATE 0.125 MG PO TABS: 0.1250 mg | ORAL_TABLET | Freq: Four times a day (QID) | ORAL | 1 refills | Status: DC | PRN

## 2017-09-23 NOTE — Patient Instructions (Addendum)
Prednisone taper for persistent cough and shortness of breath.  Medicines refilled today. I've sent to your pharmacy.  Update UDS today.  Return in 3 months for physical.

## 2017-09-23 NOTE — Progress Notes (Signed)
BP 124/76 (BP Location: Left Arm, Patient Position: Sitting, Cuff Size: Normal)   Pulse 92   Temp 98.1 F (36.7 C) (Oral)   Wt 201 lb (91.2 kg)   SpO2 97%   BMI 32.44 kg/m    CC: pain management, cough f/u Subjective:    Patient ID: Kaylee [name], female    DOB: [birth year], 57 y.o.   MRN: [id number]  HPI: Kaylee [name] is a 57 y.o. female presenting on 09/23/2017 for Pain (Here for pain management f/u.) and Cough (Dry cough since 09/10/17. Was seen at [facility] ED due to COPD exacerbation. Provided summary. Still c/o cough.)   Got married 09/01/2017 to a plumber.   Seen 3/5 at [facility] ER with COPD exacerbation, records reviewed. Treated with zpack and prednisone burst. CXR did not show pneumonia. Has tried OTC remedies without improvement. Wheezing is better. Ongoing dyspnea despite using albuterol inh and xopenex neb. Endorses head congestion.   Anxiety - taking abilify 2mg  daily, valium 10mg  bid, xanax for breakthrough anxiety. Pharmacy accidentally filled #60 xanax 07/23/2017 (should have been #15). After discussion with pharmacist and pt #60 should last several months. Has not refilled since then. We are undergoing slow taper off xanax given she's already on valium.   Chronic constipation - managed with IBguard and linzess followed by GI. Both are very expensive.   Obesity - last visit we started orlistat- was not covered by insurance. She has been using OTC alli, limiting carbs. 5 lb weight down.   Chronic pain - managed with oxycodone 7.5/325mg  TID. Requests refill today.  Chronic knee pain followed by ortho - s/p knee injection and MRI - f/u planned 11/2017. Lower back pain - has received 3 ESI injections.   Relevant past medical, surgical, family and social history reviewed and updated as indicated. Interim medical history since our last visit reviewed. Allergies and medications reviewed and updated. Outpatient Medications Prior to Visit  Medication Sig Dispense  Refill  . Alpha-Lipoic Acid 600 MG CAPS Take 1 capsule by mouth daily.    09/20/2017 ALPRAZolam (XANAX) 1 MG tablet TAKE 1 TABLET BY MOUTH TWICE A DAY 10 tablet 0  . ARIPiprazole (ABILIFY) 2 MG tablet Take 1 tablet (2 mg total) by mouth daily. 90 tablet 0  . esomeprazole (NEXIUM) 40 MG capsule TAKE 1 CAPSULE BY MOUTH AS NEEDED 90 capsule 0  . etodolac (LODINE) 400 MG tablet Take 1 tablet (400 mg total) by mouth 2 (two) times daily as needed for moderate pain. 60 tablet 3  . fluticasone (FLONASE) 50 MCG/ACT nasal spray Place 2 sprays into both nostrils daily. 16 g 15  . Fluticasone-Salmeterol (ADVAIR DISKUS) 250-50 MCG/DOSE AEPB INHALE 1 PUFF BY MOUTH TWICE A DAY 60 each 11  . NEOMYCIN-POLYMYXIN-HYDROCORTISONE (CORTISPORIN) 1 % SOLN otic solution PLACE 3 DROPS INTO THE LEFT EAR 3 TIMES A DAY AS DIRECTED 10 mL 1  . OVER THE COUNTER MEDICATION Take 2 tablets by mouth as needed. IBgard    . trimethoprim-polymyxin b (POLYTRIM) ophthalmic solution Place 1 drop into the left eye every 6 (six) hours. 10 mL 0  . acyclovir (ZOVIRAX) 400 MG tablet TAKE 1 TABLET BY MOUTH TWICE A DAY. 60 tablet 2  . albuterol (PROAIR HFA) 108 (90 Base) MCG/ACT inhaler INHALE 2 PUFFS INTO THE LUNGS EVERY 4 HOURS AS NEEDED 8.5 g 3  . allopurinol (ZYLOPRIM) 100 MG tablet Take 1 tablet (100 mg total) by mouth daily. 30 tablet 6  . atorvastatin (LIPITOR) 10 MG  tablet TAKE 1 TABLET BY MOUTH EVERY DAY 30 tablet 0  . colchicine 0.6 MG tablet TAKE 1 TABLET BY MOUTH DAILY AS NEEDED (ARTHRITIS PAIN) 30 tablet 2  . diazepam (VALIUM) 10 MG tablet Take 1 tablet (10 mg total) by mouth 2 (two) times daily. 60 tablet 0  . estradiol (ESTRACE) 0.1 MG/GM vaginal cream Apply 1 gram per vagina every night for 2 weeks, then apply three times a week 30 g 12  . fluconazole (DIFLUCAN) 150 MG tablet TAKE 1 TABLET BY MOUTH ONCE. MAY REPEAT IN 1 WEEK 2 tablet 0  . furosemide (LASIX) 20 MG tablet TAKE ONE (1) TABLET EACH DAY AS NEEDED 90 tablet 3  .  glucosamine-chondroitin 500-400 MG tablet Take by mouth.    . hyoscyamine (LEVSIN, ANASPAZ) 0.125 MG tablet Take by mouth.    . linaclotide (LINZESS) 290 MCG CAPS capsule Take 1 capsule (290 mcg total) by mouth daily before breakfast. 30 capsule 3  . methocarbamol (ROBAXIN) 750 MG tablet TAKE 1 TABLET BY MOUTH 2 TIMES DAILY AS NEEDED FOR MUSCLE SPASMS 60 tablet 3  . omega-3 acid ethyl esters (LOVAZA) 1 g capsule TWO CAPSULES EACH DAY 60 capsule 6  . orlistat (XENICAL) 120 MG capsule Take 1 capsule (120 mg total) by mouth 2 (two) times daily with a meal. 60 capsule 3  . oxyCODONE-acetaminophen (PERCOCET) 7.5-325 MG tablet TAKE 1 TABLET BY MOUTH EVERY 8 HOURS AS NEEDED FOR PAIN. 90 tablet 0  . potassium chloride (K-DUR) 10 MEQ tablet ONE TABLET EACH DAY AS NEEDED 90 tablet 3  . tiotropium (SPIRIVA HANDIHALER) 18 MCG inhalation capsule Place 1 capsule (18 mcg total) into inhaler and inhale daily. 90 capsule 3  . colchicine 0.6 MG tablet     . amitriptyline (ELAVIL) 25 MG tablet Take 25 mg by mouth.    . hydroxychloroquine (PLAQUENIL) 200 MG tablet Take by mouth.    . magnesium oxide (MAG-OX) 400 MG tablet Take 400 mg by mouth daily.    [name] triamcinolone cream (KENALOG) 0.1 % Apply 1 application topically 2 (two) times daily. Apply to AA. 30 g 0   No facility-administered medications prior to visit.      Per HPI unless specifically indicated in ROS section below Review of Systems     Objective:    BP 124/76 (BP Location: Left Arm, Patient Position: Sitting, Cuff Size: Normal)   Pulse 92   Temp 98.1 F (36.7 C) (Oral)   Wt 201 lb (91.2 kg)   SpO2 97%   BMI 32.44 kg/m   Wt Readings from Last 3 Encounters:  09/23/17 201 lb (91.2 kg)  06/17/17 206 lb (93.4 kg)  06/08/17 205 lb (93 kg)    Physical Exam  Constitutional: She appears well-developed and well-nourished. No distress.  HENT:  Head: Normocephalic and atraumatic.  Right Ear: Hearing and external ear normal.  Left Ear: Hearing  and external ear normal.  Nose: Mucosal edema present. No rhinorrhea. Right sinus exhibits no maxillary sinus tenderness and no frontal sinus tenderness. Left sinus exhibits no maxillary sinus tenderness and no frontal sinus tenderness.  Mouth/Throat: Uvula is midline, oropharynx is clear and moist and mucous membranes are normal. No oropharyngeal exudate, posterior oropharyngeal edema, posterior oropharyngeal erythema or tonsillar abscesses.  Eyes: Conjunctivae and EOM are normal. Pupils are equal, round, and reactive to light. No scleral icterus.  Neck: Normal range of motion. Neck supple.  Cardiovascular: Normal rate, regular rhythm, normal heart sounds and intact distal pulses.  No  murmur heard. Pulmonary/Chest: Effort normal and breath sounds normal. No respiratory distress. She has no wheezes. She has no rales.  Lungs clear   Lymphadenopathy:    She has no cervical adenopathy.  Skin: Skin is warm and dry. No rash noted.  Nursing note and vitals reviewed.  Results for orders placed or performed in visit on 06/17/17  Pain Mgmt, Profile 8 w/Conf, U  Result Value Ref Range   Creatinine 123.3 > or = 20. mg/dL   pH 6.73 4.5 - 9.0   Oxidant NEGATIVE <200 mcg/mL   Amphetamines NEGATIVE <500 ng/mL   medMATCH Amphetamines CONSISTENT    Benzodiazepines POSITIVE (A) <100 ng/mL   Alphahydroxyalprazolam 25 (H) <25 ng/mL   medMATCH aOH alprazolam INCONSISTENT    Alphahydroxymidazolam NEGATIVE <50 ng/mL   medMATCH aOH midazolam CONSISTENT    Alphahydroxytriazolam NEGATIVE <50 ng/mL   medMATCH aOH triazolam CONSISTENT    Aminoclonazepam NEGATIVE <25 ng/mL   medMATCH Aminoclonazepam CONSISTENT    Hydroxyethylflurazepam NEGATIVE <50 ng/mL   medMATCH OH,Et flurazepam CONSISTENT    Lorazepam NEGATIVE <50 ng/mL   medMATCH Lorazepam CONSISTENT    Nordiazepam >1,000 (H) <50 ng/mL   medMATCH Nordiazepam INCONSISTENT    Oxazepam >1,000 (H) <50 ng/mL   medMATCH Oxazepam INCONSISTENT    Temazepam  >1,000 (H) <50 ng/mL   medMATCH Temazepam INCONSISTENT    Marijuana Metabolite NEGATIVE <20 ng/mL   medMATCH Marijuana Metab CONSISTENT    Cocaine Metabolite NEGATIVE <150 ng/mL   medMATCH Cocaine Metab CONSISTENT    Opiates NEGATIVE CONFIRMED <100 ng/mL   Codeine NEGATIVE <50 ng/mL   medMATCH Codeine CONSISTENT    Hydrocodone NEGATIVE <50 ng/mL   medMATCH Hydrocodone CONSISTENT    Hydromorphone NEGATIVE <50 ng/mL   medMATCH Hydromorphone CONSISTENT    Morphine NEGATIVE <50 ng/mL   medMATCH Morphine CONSISTENT    Norhydrocodone NEGATIVE <50 ng/mL   medMATCH Norhydrocodone CONSISTENT    Oxycodone POSITIVE (A) <100 ng/mL   Noroxycodone 2,390 (H) <50 ng/mL   medMATCH Noroxycodone INCONSISTENT    Oxycodone 1,355 (H) <50 ng/mL   medMATCH Oxycodone INCONSISTENT    Oxymorphone 1,000 (H) <50 ng/mL   medMATCH Oxymorphone INCONSISTENT    Buprenorphine, Urine NEGATIVE <5 ng/mL   medMATCH Buprenorphine CONSISTENT    MDMA NEGATIVE <500 ng/mL   [facility] MDMA CONSISTENT    Alcohol Metabolites NEGATIVE <500 ng/mL   medMATCH Alcohol Metab CONSISTENT    6 Acetylmorphine NEGATIVE <10 ng/mL   medMATCH 6 Acetylmorphine CONSISTENT       Assessment & Plan:  Over 40 minutes were spent face-to-face with the patient during this encounter and >50% of that time was spent on counseling and coordination of care  Medications reviewed and refilled x 1 year F/u visit will be CPE Problem List Items Addressed This Visit    Abnormal drug screen    Update UDS. Currently on Q41mo checks, consider spacing to Q6 mo if normal.       Bipolar 1 disorder (HCC)    Encouraged continued abilify.       Chronic constipation    Appreciate GI care. Continue linzess.       Chronic pain syndrome    Multifactorial - osteoarthritis, carries dx of seronegative rheumatoid arthritis. Has had ESI with temporary benefit - upcoming f/u.  Followed by [facility] pain management for interventional treatment, we prescribe narcotic  therapy.       Relevant Orders   Pain Mgmt, Profile 8 w/Conf, U   Cough    Recently seen  at [facility] ER with cough, dx COPD exacerbation. Doesn't have confirmed COPD dx by spirometry at this time, but does take spiriva and advair regularly for presumed dx. I recommended spirometry at future appt when feeling well to fully evaluate this.  Given ongoing cough/dyspnea without evidence of ongoing bacterial infection, will prescribe prednisone taper.  Ex smoker, 60 PY hx, quit 2010.       Relevant Medications   albuterol (PROAIR HFA) 108 (90 Base) MCG/ACT inhaler   Encounter for chronic pain management - Primary    [place] CSRS reviewed and appropriate Oxycodone 7.5/APAP refilled #90.       Relevant Orders   Pain Mgmt, Profile 8 w/Conf, U   Ex-smoker    Ex smoker, 60 PY hx, quit 2010.  Will qualify for lung cancer screening low dose CT scan      GAD (generalized anxiety disorder)    H/o severe anxiety, on abilify, amitriptyline, valium, xanax prn No longer sees psych. Also has dx bipolar 1.  Previously discussed goal eventual taper off xanax as she continues regular valium use.        Relevant Medications   diazepam (VALIUM) 10 MG tablet   Irritable bowel syndrome with constipation    Doing well on linzess and IBGard however these are expensive. Followed by GI.      Relevant Medications   hyoscyamine (LEVSIN, ANASPAZ) 0.125 MG tablet   linaclotide (LINZESS) 290 MCG CAPS capsule   Obesity, Class I, BMI 30-34.9    Weight loss reviewed, encouraged. Discussed OTC alli usage.       Seronegative rheumatoid arthritis (HCC)    Previously saw [facility]. Self discontinued plaquenil.       Relevant Medications   allopurinol (ZYLOPRIM) 100 MG tablet   colchicine 0.6 MG tablet   methocarbamol (ROBAXIN) 750 MG tablet   oxyCODONE-acetaminophen (PERCOCET) 7.5-325 MG tablet   predniSONE (DELTASONE) 20 MG tablet    Other Visit Diagnoses    Vaginal atrophy       Relevant Medications     estradiol (ESTRACE) 0.1 MG/GM vaginal cream       Meds ordered this encounter  Medications  . tiotropium (SPIRIVA HANDIHALER) 18 MCG inhalation capsule    Sig: Place 1 capsule (18 mcg total) into inhaler and inhale daily.    Dispense:  90 capsule    Refill:  3  . acyclovir (ZOVIRAX) 400 MG tablet    Sig: Take 1 tablet (400 mg total) by mouth 2 (two) times daily.    Dispense:  60 tablet    Refill:  2  . albuterol (PROAIR HFA) 108 (90 Base) MCG/ACT inhaler    Sig: INHALE 2 PUFFS INTO THE LUNGS EVERY 4 HOURS AS NEEDED    Dispense:  8.5 g    Refill:  3  . allopurinol (ZYLOPRIM) 100 MG tablet    Sig: Take 1 tablet (100 mg total) by mouth daily.    Dispense:  90 tablet    Refill:  3  . atorvastatin (LIPITOR) 10 MG tablet    Sig: Take 1 tablet (10 mg total) by mouth daily.    Dispense:  90 tablet    Refill:  3  . colchicine 0.6 MG tablet    Sig: Take 1 tablet (0.6 mg total) by mouth daily as needed.    Dispense:  30 tablet    Refill:  2  . diazepam (VALIUM) 10 MG tablet    Sig: Take 1 tablet (10 mg total) by mouth 2 (  two) times daily.    Dispense:  60 tablet    Refill:  0    Fill on 09/27/2017  . estradiol (ESTRACE) 0.1 MG/GM vaginal cream    Sig: Apply 1 gram per vagina every night for 2 weeks, then apply three times a week    Dispense:  30 g    Refill:  12  . fluconazole (DIFLUCAN) 150 MG tablet    Sig: TAKE 1 TABLET BY MOUTH ONCE. MAY REPEAT IN 1 WEEK    Dispense:  2 tablet    Refill:  0  . furosemide (LASIX) 20 MG tablet    Sig: TAKE ONE (1) TABLET EACH DAY AS NEEDED    Dispense:  90 tablet    Refill:  3  . glucosamine-chondroitin 500-400 MG tablet    Sig: Take 2 tablets by mouth daily.  . hyoscyamine (LEVSIN, ANASPAZ) 0.125 MG tablet    Sig: Take 1 tablet (0.125 mg total) by mouth every 6 (six) hours as needed.    Dispense:  30 tablet    Refill:  1  . linaclotide (LINZESS) 290 MCG CAPS capsule    Sig: Take 1 capsule (290 mcg total) by mouth daily before  breakfast.    Dispense:  30 capsule    Refill:  11  . methocarbamol (ROBAXIN) 750 MG tablet    Sig: TAKE 1 TABLET BY MOUTH 2 TIMES DAILY AS NEEDED FOR MUSCLE SPASMS    Dispense:  60 tablet    Refill:  3  . omega-3 acid ethyl esters (LOVAZA) 1 g capsule    Sig: TWO CAPSULES EACH DAY    Dispense:  60 capsule    Refill:  11  . oxyCODONE-acetaminophen (PERCOCET) 7.5-325 MG tablet    Sig: Take 1 tablet by mouth every 8 (eight) hours as needed. for pain    Dispense:  90 tablet    Refill:  0    Fill on 09/27/2017  . potassium chloride (K-DUR) 10 MEQ tablet    Sig: ONE TABLET EACH DAY AS NEEDED    Dispense:  90 tablet    Refill:  3  . orlistat (ALLI) 60 MG capsule    Sig: Take 1 capsule (60 mg total) by mouth 2 (two) times daily with a meal.  . predniSONE (DELTASONE) 20 MG tablet    Sig: Take two tablets daily for 3 days followed by one tablet daily for 4 days    Dispense:  10 tablet    Refill:  0   Orders Placed This Encounter  Procedures  . Pain Mgmt, Profile 8 w/Conf, U    Order Specific Question:   Prescribed drugs 1:    Answer:   VALIUM    Order Specific Question:   Prescribed drugs 2:    Answer:   OXYCODONE    Order Specific Question:   Prescribed drugs 3:    AnswerPrudy Feeler    Follow up plan: Return in about 3 months (around 12/24/2017) for annual exam, prior fasting for blood work.  [name], MD

## 2017-09-24 NOTE — Telephone Encounter (Signed)
Oxycodone last filled:  08/31/17, #90 Diazepam last filled:  08/31/17, #60 Last OV:  09/23/17 Next OV (CPE):  12/28/17

## 2017-09-25 ENCOUNTER — Encounter: Payer: Self-pay | Admitting: Family Medicine

## 2017-09-25 NOTE — Assessment & Plan Note (Signed)
Encouraged continued abilify.

## 2017-09-25 NOTE — Assessment & Plan Note (Addendum)
Previously saw [facility]. Self discontinued plaquenil.

## 2017-09-25 NOTE — Assessment & Plan Note (Signed)
Doing well on linzess and IBGard however these are expensive. Followed by GI.

## 2017-09-25 NOTE — Assessment & Plan Note (Addendum)
Update UDS. Currently on Q17mo checks, consider spacing to Q6 mo if normal.

## 2017-09-25 NOTE — Assessment & Plan Note (Signed)
Ex smoker, 60 PY hx, quit 2010.  Will qualify for lung cancer screening low dose CT scan

## 2017-09-25 NOTE — Assessment & Plan Note (Signed)
H/o severe anxiety, on abilify, amitriptyline, valium, xanax prn No longer sees psych. Also has dx bipolar 1.  Previously discussed goal eventual taper off xanax as she continues regular valium use.

## 2017-09-25 NOTE — Assessment & Plan Note (Signed)
Multifactorial - osteoarthritis, carries dx of seronegative rheumatoid arthritis. Has had ESI with temporary benefit - upcoming f/u.  Followed by [facility] pain management for interventional treatment, we prescribe narcotic therapy.

## 2017-09-25 NOTE — Assessment & Plan Note (Signed)
Appreciate GI care. Continue linzess.

## 2017-09-25 NOTE — Assessment & Plan Note (Signed)
Weight loss reviewed, encouraged. Discussed OTC alli usage.

## 2017-09-25 NOTE — Assessment & Plan Note (Addendum)
Recently seen at [facility] ER with cough, dx COPD exacerbation. Doesn't have confirmed COPD dx by spirometry at this time, but does take spiriva and advair regularly for presumed dx. I recommended spirometry at future appt when feeling well to fully evaluate this.  Given ongoing cough/dyspnea without evidence of ongoing bacterial infection, will prescribe prednisone taper.  Ex smoker, 60 PY hx, quit 2010.

## 2017-09-25 NOTE — Assessment & Plan Note (Signed)
[place] CSRS reviewed and appropriate Oxycodone 7.5/APAP refilled #90.

## 2017-09-27 ENCOUNTER — Other Ambulatory Visit: Payer: Self-pay | Admitting: Family Medicine

## 2017-09-27 DIAGNOSIS — M47816 Spondylosis without myelopathy or radiculopathy, lumbar region: Secondary | ICD-10-CM | POA: Diagnosis not present

## 2017-09-27 DIAGNOSIS — M2550 Pain in unspecified joint: Secondary | ICD-10-CM | POA: Diagnosis not present

## 2017-09-27 DIAGNOSIS — M5417 Radiculopathy, lumbosacral region: Secondary | ICD-10-CM | POA: Diagnosis not present

## 2017-09-27 DIAGNOSIS — M5416 Radiculopathy, lumbar region: Secondary | ICD-10-CM | POA: Diagnosis not present

## 2017-09-27 DIAGNOSIS — G894 Chronic pain syndrome: Secondary | ICD-10-CM | POA: Diagnosis not present

## 2017-09-27 NOTE — Telephone Encounter (Signed)
Not on current med list  Last filled:  03/31/17, #90 Last OV:  09/23/17 Next OV (CPE):  12/28/17

## 2017-09-28 ENCOUNTER — Other Ambulatory Visit: Payer: Self-pay | Admitting: Family Medicine

## 2017-09-29 NOTE — Telephone Encounter (Signed)
Will refill 3 months, will need to discuss continuation at next OV

## 2017-09-30 LAB — PAIN MGMT, PROFILE 8 W/CONF, U
6 Acetylmorphine: NEGATIVE ng/mL (ref <10)
ALCOHOL METABOLITES: POSITIVE ng/mL — AB (ref <500)
ALPHAHYDROXYALPRAZOLAM: 87 ng/mL — AB (ref <25)
ALPHAHYDROXYTRIAZOLAM: NEGATIVE ng/mL (ref <50)
AMINOCLONAZEPAM: NEGATIVE ng/mL (ref <25)
Alphahydroxymidazolam: NEGATIVE ng/mL (ref <50)
Amphetamines: NEGATIVE ng/mL (ref <500)
BENZODIAZEPINES: POSITIVE ng/mL — AB (ref <100)
BUPRENORPHINE, URINE: NEGATIVE ng/mL (ref <5)
COCAINE METABOLITE: NEGATIVE ng/mL (ref <150)
CODEINE: NEGATIVE ng/mL (ref <50)
Creatinine: 127 mg/dL
ETHYL GLUCURONIDE (ETG): 9995 ng/mL — AB (ref <500)
Ethyl Sulfate (ETS): 1777 ng/mL — ABNORMAL HIGH (ref <100)
HYDROCODONE: NEGATIVE ng/mL (ref <50)
HYDROMORPHONE: NEGATIVE ng/mL (ref <50)
HYDROXYETHYLFLURAZEPAM: NEGATIVE ng/mL (ref <50)
Lorazepam: NEGATIVE ng/mL (ref <50)
MARIJUANA METABOLITE: NEGATIVE ng/mL (ref <20)
MDMA: NEGATIVE ng/mL (ref <500)
Morphine: NEGATIVE ng/mL (ref <50)
NOROXYCODONE: 2845 ng/mL — AB (ref <50)
Nordiazepam: 837 ng/mL — ABNORMAL HIGH (ref <50)
Norhydrocodone: NEGATIVE ng/mL (ref <50)
OXAZEPAM: 2443 ng/mL — AB (ref <50)
OXYMORPHONE: 2458 ng/mL — AB (ref <50)
Opiates: NEGATIVE ng/mL (ref <100)
Oxidant: NEGATIVE ug/mL (ref <200)
Oxycodone: 2668 ng/mL — ABNORMAL HIGH (ref <50)
Oxycodone: POSITIVE ng/mL — AB (ref <100)
TEMAZEPAM: 2150 ng/mL — AB (ref <50)
pH: 5.78 (ref 4.5–9.0)

## 2017-10-02 ENCOUNTER — Encounter: Payer: Self-pay | Admitting: Family Medicine

## 2017-10-18 ENCOUNTER — Other Ambulatory Visit: Payer: Self-pay | Admitting: Family Medicine

## 2017-10-18 NOTE — Telephone Encounter (Signed)
Oxycodone last filled:  09/27/17, #90 Diazepam last filled:  09/27/17, #60 Last OV:  09/23/17 Next OV (CPE):  12/28/17

## 2017-10-20 NOTE — Telephone Encounter (Signed)
E presribed 

## 2017-10-21 ENCOUNTER — Other Ambulatory Visit: Payer: Self-pay | Admitting: Family Medicine

## 2017-10-21 NOTE — Telephone Encounter (Signed)
Last filled:  09/27/17, #60 Last OV:  09/23/17 Next OV (CPE):  12/28/17

## 2017-10-28 ENCOUNTER — Other Ambulatory Visit: Payer: Self-pay | Admitting: Family Medicine

## 2017-10-28 DIAGNOSIS — J40 Bronchitis, not specified as acute or chronic: Principal | ICD-10-CM

## 2017-10-28 DIAGNOSIS — J329 Chronic sinusitis, unspecified: Secondary | ICD-10-CM

## 2017-11-10 DIAGNOSIS — S83221D Peripheral tear of medial meniscus, current injury, right knee, subsequent encounter: Secondary | ICD-10-CM | POA: Diagnosis not present

## 2017-11-10 DIAGNOSIS — M1711 Unilateral primary osteoarthritis, right knee: Secondary | ICD-10-CM | POA: Diagnosis not present

## 2017-11-15 ENCOUNTER — Other Ambulatory Visit: Payer: Self-pay | Admitting: Family Medicine

## 2017-11-18 ENCOUNTER — Other Ambulatory Visit: Payer: Self-pay | Admitting: Family Medicine

## 2017-11-18 NOTE — Telephone Encounter (Signed)
Pt states she is having a severe time with her acid reflux, is out of esomeprazole (NEXIUM) 40 MG capsule Requesting a refill asap please  [facility] [id number] [place], [place] - [address] [phone] (Phone) [phone] (Fax)

## 2017-11-22 ENCOUNTER — Other Ambulatory Visit: Payer: Self-pay | Admitting: Family Medicine

## 2017-11-22 DIAGNOSIS — F319 Bipolar disorder, unspecified: Secondary | ICD-10-CM

## 2017-11-22 NOTE — Telephone Encounter (Signed)
Oxycodone and Diazepam last filled 10-28-17  Last OV 09-23-17 Next OV 12-28-17 UDS 09-23-17 No CSA  Looks like they can wait for Dr Reece Agar as they are not due to be filled until around 11-26-17

## 2017-11-22 NOTE — Telephone Encounter (Signed)
Pt is aware refills are early.

## 2017-11-24 NOTE — Telephone Encounter (Signed)
Eprescribed.

## 2017-11-27 ENCOUNTER — Other Ambulatory Visit: Payer: Self-pay | Admitting: Family Medicine

## 2017-11-29 NOTE — Telephone Encounter (Signed)
Last filled:  09/29/17, #90 Last OV:  09/23/17 Next OV (CPE):  12/28/17

## 2017-11-30 DIAGNOSIS — S83221D Peripheral tear of medial meniscus, current injury, right knee, subsequent encounter: Secondary | ICD-10-CM | POA: Diagnosis not present

## 2017-11-30 DIAGNOSIS — M1711 Unilateral primary osteoarthritis, right knee: Secondary | ICD-10-CM | POA: Diagnosis not present

## 2017-12-11 HISTORY — PX: KNEE ARTHROSCOPY W/ PARTIAL MEDIAL MENISCECTOMY: SHX1882

## 2017-12-13 ENCOUNTER — Encounter: Payer: Self-pay | Admitting: Family Medicine

## 2017-12-13 DIAGNOSIS — I129 Hypertensive chronic kidney disease with stage 1 through stage 4 chronic kidney disease, or unspecified chronic kidney disease: Secondary | ICD-10-CM | POA: Diagnosis not present

## 2017-12-13 DIAGNOSIS — S83221A Peripheral tear of medial meniscus, current injury, right knee, initial encounter: Secondary | ICD-10-CM | POA: Diagnosis not present

## 2017-12-13 DIAGNOSIS — Z885 Allergy status to narcotic agent status: Secondary | ICD-10-CM | POA: Diagnosis not present

## 2017-12-13 DIAGNOSIS — J449 Chronic obstructive pulmonary disease, unspecified: Secondary | ICD-10-CM | POA: Diagnosis not present

## 2017-12-13 DIAGNOSIS — Z87891 Personal history of nicotine dependence: Secondary | ICD-10-CM | POA: Diagnosis not present

## 2017-12-13 DIAGNOSIS — Z882 Allergy status to sulfonamides status: Secondary | ICD-10-CM | POA: Diagnosis not present

## 2017-12-13 DIAGNOSIS — Z880 Allergy status to penicillin: Secondary | ICD-10-CM | POA: Diagnosis not present

## 2017-12-13 DIAGNOSIS — M797 Fibromyalgia: Secondary | ICD-10-CM | POA: Diagnosis not present

## 2017-12-13 DIAGNOSIS — I252 Old myocardial infarction: Secondary | ICD-10-CM | POA: Diagnosis not present

## 2017-12-13 DIAGNOSIS — N189 Chronic kidney disease, unspecified: Secondary | ICD-10-CM | POA: Diagnosis not present

## 2017-12-13 DIAGNOSIS — G8929 Other chronic pain: Secondary | ICD-10-CM | POA: Diagnosis not present

## 2017-12-13 DIAGNOSIS — S83241A Other tear of medial meniscus, current injury, right knee, initial encounter: Secondary | ICD-10-CM | POA: Diagnosis not present

## 2017-12-13 DIAGNOSIS — Z9981 Dependence on supplemental oxygen: Secondary | ICD-10-CM | POA: Diagnosis not present

## 2017-12-16 ENCOUNTER — Encounter: Payer: Self-pay | Admitting: Family Medicine

## 2017-12-20 ENCOUNTER — Other Ambulatory Visit: Payer: Self-pay | Admitting: Family Medicine

## 2017-12-20 DIAGNOSIS — M1990 Unspecified osteoarthritis, unspecified site: Secondary | ICD-10-CM

## 2017-12-20 DIAGNOSIS — E785 Hyperlipidemia, unspecified: Secondary | ICD-10-CM

## 2017-12-20 DIAGNOSIS — M0600 Rheumatoid arthritis without rheumatoid factor, unspecified site: Secondary | ICD-10-CM

## 2017-12-22 ENCOUNTER — Ambulatory Visit: Payer: Medicare Other

## 2017-12-28 ENCOUNTER — Ambulatory Visit (INDEPENDENT_AMBULATORY_CARE_PROVIDER_SITE_OTHER): Payer: Medicare Other | Admitting: Family Medicine

## 2017-12-28 ENCOUNTER — Encounter: Payer: Self-pay | Admitting: Family Medicine

## 2017-12-28 ENCOUNTER — Other Ambulatory Visit: Payer: Self-pay | Admitting: Family Medicine

## 2017-12-28 VITALS — BP 124/84 | HR 121 | Temp 98.1°F | Ht 65.0 in | Wt 206.5 lb

## 2017-12-28 DIAGNOSIS — M0600 Rheumatoid arthritis without rheumatoid factor, unspecified site: Secondary | ICD-10-CM

## 2017-12-28 DIAGNOSIS — E785 Hyperlipidemia, unspecified: Secondary | ICD-10-CM | POA: Diagnosis not present

## 2017-12-28 DIAGNOSIS — G8929 Other chronic pain: Secondary | ICD-10-CM | POA: Diagnosis not present

## 2017-12-28 DIAGNOSIS — G894 Chronic pain syndrome: Secondary | ICD-10-CM | POA: Diagnosis not present

## 2017-12-28 DIAGNOSIS — K581 Irritable bowel syndrome with constipation: Secondary | ICD-10-CM | POA: Diagnosis not present

## 2017-12-28 DIAGNOSIS — J329 Chronic sinusitis, unspecified: Secondary | ICD-10-CM | POA: Diagnosis not present

## 2017-12-28 DIAGNOSIS — N3020 Other chronic cystitis without hematuria: Secondary | ICD-10-CM

## 2017-12-28 DIAGNOSIS — M1990 Unspecified osteoarthritis, unspecified site: Secondary | ICD-10-CM

## 2017-12-28 DIAGNOSIS — J209 Acute bronchitis, unspecified: Secondary | ICD-10-CM

## 2017-12-28 DIAGNOSIS — F411 Generalized anxiety disorder: Secondary | ICD-10-CM

## 2017-12-28 DIAGNOSIS — N952 Postmenopausal atrophic vaginitis: Secondary | ICD-10-CM | POA: Diagnosis not present

## 2017-12-28 DIAGNOSIS — R059 Cough, unspecified: Secondary | ICD-10-CM

## 2017-12-28 DIAGNOSIS — Z0000 Encounter for general adult medical examination without abnormal findings: Secondary | ICD-10-CM

## 2017-12-28 DIAGNOSIS — F319 Bipolar disorder, unspecified: Secondary | ICD-10-CM

## 2017-12-28 DIAGNOSIS — E669 Obesity, unspecified: Secondary | ICD-10-CM | POA: Diagnosis not present

## 2017-12-28 DIAGNOSIS — K5909 Other constipation: Secondary | ICD-10-CM

## 2017-12-28 DIAGNOSIS — K219 Gastro-esophageal reflux disease without esophagitis: Secondary | ICD-10-CM

## 2017-12-28 DIAGNOSIS — Z7189 Other specified counseling: Secondary | ICD-10-CM

## 2017-12-28 DIAGNOSIS — Z1231 Encounter for screening mammogram for malignant neoplasm of breast: Secondary | ICD-10-CM

## 2017-12-28 DIAGNOSIS — J40 Bronchitis, not specified as acute or chronic: Secondary | ICD-10-CM

## 2017-12-28 DIAGNOSIS — R05 Cough: Secondary | ICD-10-CM | POA: Diagnosis not present

## 2017-12-28 MED ORDER — METHOCARBAMOL 750 MG PO TABS: ORAL_TABLET | ORAL | 11 refills | Status: DC

## 2017-12-28 MED ORDER — HYOSCYAMINE SULFATE 0.125 MG PO TABS: 0.1250 mg | ORAL_TABLET | Freq: Four times a day (QID) | ORAL | 1 refills | Status: DC | PRN

## 2017-12-28 MED ORDER — ALBUTEROL SULFATE HFA 108 (90 BASE) MCG/ACT IN AERS: INHALATION_SPRAY | RESPIRATORY_TRACT | 3 refills | Status: DC

## 2017-12-28 MED ORDER — NITROFURANTOIN MACROCRYSTAL 100 MG PO CAPS: 100.0000 mg | ORAL_CAPSULE | Freq: Every day | ORAL | 1 refills | Status: DC

## 2017-12-28 MED ORDER — ETODOLAC 400 MG PO TABS: 400.0000 mg | ORAL_TABLET | Freq: Two times a day (BID) | ORAL | 3 refills | Status: DC | PRN

## 2017-12-28 MED ORDER — POTASSIUM CHLORIDE ER 10 MEQ PO TBCR: 10.0000 meq | EXTENDED_RELEASE_TABLET | Freq: Every day | ORAL | 1 refills | Status: DC | PRN

## 2017-12-28 MED ORDER — LINACLOTIDE 290 MCG PO CAPS: 290.0000 ug | ORAL_CAPSULE | Freq: Every day | ORAL | 11 refills | Status: DC

## 2017-12-28 MED ORDER — ATORVASTATIN CALCIUM 10 MG PO TABS: 10.0000 mg | ORAL_TABLET | Freq: Every day | ORAL | 3 refills | Status: DC

## 2017-12-28 MED ORDER — FUROSEMIDE 20 MG PO TABS: 20.0000 mg | ORAL_TABLET | Freq: Every day | ORAL | 1 refills | Status: DC | PRN

## 2017-12-28 MED ORDER — COLCHICINE 0.6 MG PO TABS: 0.6000 mg | ORAL_TABLET | Freq: Every day | ORAL | 2 refills | Status: DC | PRN

## 2017-12-28 MED ORDER — ARIPIPRAZOLE 2 MG PO TABS: 2.0000 mg | ORAL_TABLET | Freq: Every day | ORAL | 3 refills | Status: DC

## 2017-12-28 MED ORDER — ALLOPURINOL 100 MG PO TABS: 100.0000 mg | ORAL_TABLET | Freq: Every day | ORAL | 3 refills | Status: DC

## 2017-12-28 MED ORDER — DIAZEPAM 10 MG PO TABS: 10.0000 mg | ORAL_TABLET | Freq: Two times a day (BID) | ORAL | 0 refills | Status: DC

## 2017-12-28 MED ORDER — CETIRIZINE HCL 10 MG PO TABS: 10.0000 mg | ORAL_TABLET | Freq: Every day | ORAL | 3 refills | Status: DC

## 2017-12-28 MED ORDER — TIOTROPIUM BROMIDE MONOHYDRATE 18 MCG IN CAPS: 18.0000 ug | ORAL_CAPSULE | Freq: Every day | RESPIRATORY_TRACT | 3 refills | Status: DC

## 2017-12-28 MED ORDER — FLUTICASONE PROPIONATE 50 MCG/ACT NA SUSP: 2.0000 | Freq: Every day | NASAL | 11 refills | Status: DC

## 2017-12-28 MED ORDER — GLUCOSAMINE-CHONDROITIN 500-400 MG PO TABS: 2.0000 | ORAL_TABLET | Freq: Every day | ORAL | 3 refills | Status: DC

## 2017-12-28 MED ORDER — ESOMEPRAZOLE MAGNESIUM 40 MG PO CPDR: 40.0000 mg | DELAYED_RELEASE_CAPSULE | Freq: Every day | ORAL | 3 refills | Status: DC | PRN

## 2017-12-28 MED ORDER — AZITHROMYCIN 250 MG PO TABS: ORAL_TABLET | ORAL | 0 refills | Status: DC

## 2017-12-28 MED ORDER — OXYCODONE-ACETAMINOPHEN 7.5-325 MG PO TABS: 1.0000 | ORAL_TABLET | Freq: Three times a day (TID) | ORAL | 0 refills | Status: DC | PRN

## 2017-12-28 MED ORDER — OMEGA-3-ACID ETHYL ESTERS 1 G PO CAPS: 2.0000 g | ORAL_CAPSULE | Freq: Every day | ORAL | 11 refills | Status: DC

## 2017-12-28 MED ORDER — ACYCLOVIR 400 MG PO TABS: 400.0000 mg | ORAL_TABLET | Freq: Two times a day (BID) | ORAL | 2 refills | Status: DC

## 2017-12-28 MED ORDER — FLUTICASONE-SALMETEROL 250-50 MCG/DOSE IN AEPB: INHALATION_SPRAY | RESPIRATORY_TRACT | 11 refills | Status: DC

## 2017-12-28 MED ORDER — ALPRAZOLAM 1 MG PO TABS: 1.0000 mg | ORAL_TABLET | Freq: Every day | ORAL | 0 refills | Status: DC | PRN

## 2017-12-28 NOTE — Assessment & Plan Note (Signed)
Chronic, managed with abilify, valium, xanax PRN (tapering down). Previously saw psych.

## 2017-12-28 NOTE — Assessment & Plan Note (Addendum)
Continue abilify 2mg  daily. Previously saw psych (Dr )

## 2017-12-28 NOTE — Assessment & Plan Note (Signed)
Estrogen through GYN - she should have refills left at pharmacy.

## 2017-12-28 NOTE — Assessment & Plan Note (Signed)
Continue linzess with PRN hyoscyamine
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
## 2017-12-28 NOTE — Assessment & Plan Note (Signed)
Continue linzess. Has seen GI.

## 2017-12-28 NOTE — Assessment & Plan Note (Addendum)
[place] CSRS reviewed. Update UDS today Oxycodone refilled.

## 2017-12-28 NOTE — Assessment & Plan Note (Signed)
Pt continues colchicine and allopurinol - states this effectively helps control arthralgias.

## 2017-12-28 NOTE — Assessment & Plan Note (Signed)
Continues macrobid 100mg  daily ppx, on this longterm.

## 2017-12-28 NOTE — Assessment & Plan Note (Addendum)
Chronic, stable on atorvastatin and fish oil - I asked her to return fasting to update labs.  The 10-year ASCVD risk score Denman George DC Montez Hageman., et al., 2013) is: 1.9%   Values used to calculate the score:     Age: 57 years     Sex: Female     Is Non-Hispanic African American: No     Diabetic: No     Tobacco smoker: No     Systolic Blood Pressure: 124 mmHg     Is BP treated: No     HDL Cholesterol: 87.8 mg/dL     Total Cholesterol: 217 mg/dL

## 2017-12-28 NOTE — Assessment & Plan Note (Addendum)
Ongoing productive cough x 2 wks, feverish, coughing fits. Will Rx zpack to cover atypical bronchitis. Reviewed albuterol/spiriva/advair use. No wheezing on exam - no need for prednisone.

## 2017-12-28 NOTE — Assessment & Plan Note (Addendum)
Continue nexium 40mg  daily PRN.

## 2017-12-28 NOTE — Assessment & Plan Note (Signed)
Advanced directives - wants best friend to be HCPOA. Wants CPR and would want trial of life support.

## 2017-12-28 NOTE — Assessment & Plan Note (Deleted)
Lives with new husband

## 2017-12-28 NOTE — Assessment & Plan Note (Signed)
Preventative protocols reviewed and updated unless pt declined. Discussed healthy diet and lifestyle.  

## 2017-12-28 NOTE — Patient Instructions (Addendum)
Call [facility] to reschedule mammogram [phone] Touch base with Dr Harolyn Rutherford about estrogen plan. Check with pharmacy first.  Return for fasting labs at your convenience.  Urine screen today.  Medicines refilled today.  Take azithromycin zpack antibiotic for cough. May continue albuterol inhaler. Let us know if not improving with treatment. Continue regular spiriva (once daily) and advair (2 puffs twice daily).  Health Maintenance, Female Adopting a healthy lifestyle and getting preventive care can go a long way to promote health and wellness. Talk with your health care provider about what schedule of regular examinations is right for you. This is a good chance for you to check in with your provider about disease prevention and staying healthy. In between checkups, there are plenty of things you can do on your own. Experts have done a lot of research about which lifestyle changes and preventive measures are most likely to keep you healthy. Ask your health care provider for more information. Weight and diet Eat a healthy diet  Be sure to include plenty of vegetables, fruits, low-fat dairy products, and lean protein.  Do not eat a lot of foods high in solid fats, added sugars, or salt.  Get regular exercise. This is one of the most important things you can do for your health. ? Most adults should exercise for at least 150 minutes each week. The exercise should increase your heart rate and make you sweat (moderate-intensity exercise). ? Most adults should also do strengthening exercises at least twice a week. This is in addition to the moderate-intensity exercise.  Maintain a healthy weight  Body mass index (BMI) is a measurement that can be used to identify possible weight problems. It estimates body fat based on height and weight. Your health care provider can help determine your BMI and help you achieve or maintain a healthy weight.  For females 67 years of age and  older: ? A BMI below 18.5 is considered underweight. ? A BMI of 18.5 to 24.9 is normal. ? A BMI of 25 to 29.9 is considered overweight. ? A BMI of 30 and above is considered obese.  Watch levels of cholesterol and blood lipids  You should start having your blood tested for lipids and cholesterol at 58 years of age, then have this test every 5 years.  You may need to have your cholesterol levels checked more often if: ? Your lipid or cholesterol levels are high. ? You are older than 58 years of age. ? You are at high risk for heart disease.  Cancer screening Lung Cancer  Lung cancer screening is recommended for adults 32-26 years old who are at high risk for lung cancer because of a history of smoking.  A yearly low-dose CT scan of the lungs is recommended for people who: ? Currently smoke. ? Have quit within the past 15 years. ? Have at least a 30-pack-year history of smoking. A pack year is smoking an average of one pack of cigarettes a day for 1 year.  Yearly screening should continue until it has been 15 years since you quit.  Yearly screening should stop if you develop a health problem that would prevent you from having lung cancer treatment.  Breast Cancer  Practice breast self-awareness. This means understanding how your breasts normally appear and feel.  It also means doing regular breast self-exams. Let your health care provider know about any changes, no matter how small.  If you are in your 20s or 30s, you should  have a clinical breast exam (CBE) by a health care provider every 1-3 years as part of a regular health exam.  If you are 12 or older, have a CBE every year. Also consider having a breast X-ray (mammogram) every year.  If you have a family history of breast cancer, talk to your health care provider about genetic screening.  If you are at high risk for breast cancer, talk to your health care provider about having an MRI and a mammogram every year.  Breast  cancer gene (BRCA) assessment is recommended for women who have family members with BRCA-related cancers. BRCA-related cancers include: ? Breast. ? Ovarian. ? Tubal. ? Peritoneal cancers.  Results of the assessment will determine the need for genetic counseling and BRCA1 and BRCA2 testing.  Cervical Cancer Your health care provider may recommend that you be screened regularly for cancer of the pelvic organs (ovaries, uterus, and vagina). This screening involves a pelvic examination, including checking for microscopic changes to the surface of your cervix (Pap test). You may be encouraged to have this screening done every 3 years, beginning at age 37.  For women ages 24-65, health care providers may recommend pelvic exams and Pap testing every 3 years, or they may recommend the Pap and pelvic exam, combined with testing for human papilloma virus (HPV), every 5 years. Some types of HPV increase your risk of cervical cancer. Testing for HPV may also be done on women of any age with unclear Pap test results.  Other health care providers may not recommend any screening for nonpregnant women who are considered low risk for pelvic cancer and who do not have symptoms. Ask your health care provider if a screening pelvic exam is right for you.  If you have had past treatment for cervical cancer or a condition that could lead to cancer, you need Pap tests and screening for cancer for at least 20 years after your treatment. If Pap tests have been discontinued, your risk factors (such as having a new sexual partner) need to be reassessed to determine if screening should resume. Some women have medical problems that increase the chance of getting cervical cancer. In these cases, your health care provider may recommend more frequent screening and Pap tests.  Colorectal Cancer  This type of cancer can be detected and often prevented.  Routine colorectal cancer screening usually begins at 58 years of age and  continues through 58 years of age.  Your health care provider may recommend screening at an earlier age if you have risk factors for colon cancer.  Your health care provider may also recommend using home test kits to check for hidden blood in the stool.  A small camera at the end of a tube can be used to examine your colon directly (sigmoidoscopy or colonoscopy). This is done to check for the earliest forms of colorectal cancer.  Routine screening usually begins at age 45.  Direct examination of the colon should be repeated every 5-10 years through 58 years of age. However, you may need to be screened more often if early forms of precancerous polyps or small growths are found.  Skin Cancer  Check your skin from head to toe regularly.  Tell your health care provider about any new moles or changes in moles, especially if there is a change in a mole's shape or color.  Also tell your health care provider if you have a mole that is larger than the size of a pencil eraser.  Always  use sunscreen. Apply sunscreen liberally and repeatedly throughout the day.  Protect yourself by wearing long sleeves, pants, a wide-brimmed hat, and sunglasses whenever you are outside.  Heart disease, diabetes, and high blood pressure  High blood pressure causes heart disease and increases the risk of stroke. High blood pressure is more likely to develop in: ? People who have blood pressure in the high end of the normal range (130-139/85-89 mm Hg). ? People who are overweight or obese. ? People who are African American.  If you are 8-41 years of age, have your blood pressure checked every 3-5 years. If you are 28 years of age or older, have your blood pressure checked every year. You should have your blood pressure measured twice-once when you are at a hospital or clinic, and once when you are not at a hospital or clinic. Record the average of the two measurements. To check your blood pressure when you are not  at a hospital or clinic, you can use: ? An automated blood pressure machine at a pharmacy. ? A home blood pressure monitor.  If you are between 19 years and 62 years old, ask your health care provider if you should take aspirin to prevent strokes.  Have regular diabetes screenings. This involves taking a blood sample to check your fasting blood sugar level. ? If you are at a normal weight and have a low risk for diabetes, have this test once every three years after 58 years of age. ? If you are overweight and have a high risk for diabetes, consider being tested at a younger age or more often. Preventing infection Hepatitis B  If you have a higher risk for hepatitis B, you should be screened for this virus. You are considered at high risk for hepatitis B if: ? You were born in a country where hepatitis B is common. Ask your health care provider which countries are considered high risk. ? Your parents were born in a high-risk country, and you have not been immunized against hepatitis B (hepatitis B vaccine). ? You have HIV or AIDS. ? You use needles to inject street drugs. ? You live with someone who has hepatitis B. ? You have had sex with someone who has hepatitis B. ? You get hemodialysis treatment. ? You take certain medicines for conditions, including cancer, organ transplantation, and autoimmune conditions.  Hepatitis C  Blood testing is recommended for: ? Everyone born from [birth year] through 1966. ? Anyone with known risk factors for hepatitis C.  Sexually transmitted infections (STIs)  You should be screened for sexually transmitted infections (STIs) including gonorrhea and chlamydia if: ? You are sexually active and are younger than 58 years of age. ? You are older than 58 years of age and your health care provider tells you that you are at risk for this type of infection. ? Your sexual activity has changed since you were last screened and you are at an increased risk for chlamydia  or gonorrhea. Ask your health care provider if you are at risk.  If you do not have HIV, but are at risk, it may be recommended that you take a prescription medicine daily to prevent HIV infection. This is called pre-exposure prophylaxis (PrEP). You are considered at risk if: ? You are sexually active and do not regularly use condoms or know the HIV status of your partner(s). ? You take drugs by injection. ? You are sexually active with a partner who has HIV.  Talk with your  health care provider about whether you are at high risk of being infected with HIV. If you choose to begin PrEP, you should first be tested for HIV. You should then be tested every 3 months for as long as you are taking PrEP. Pregnancy  If you are premenopausal and you may become pregnant, ask your health care provider about preconception counseling.  If you may become pregnant, take 400 to 800 micrograms (mcg) of folic acid every day.  If you want to prevent pregnancy, talk to your health care provider about birth control (contraception). Osteoporosis and menopause  Osteoporosis is a disease in which the bones lose minerals and strength with aging. This can result in serious bone fractures. Your risk for osteoporosis can be identified using a bone density scan.  If you are 57 years of age or older, or if you are at risk for osteoporosis and fractures, ask your health care provider if you should be screened.  Ask your health care provider whether you should take a calcium or vitamin D supplement to lower your risk for osteoporosis.  Menopause may have certain physical symptoms and risks.  Hormone replacement therapy may reduce some of these symptoms and risks. Talk to your health care provider about whether hormone replacement therapy is right for you. Follow these instructions at home:  Schedule regular health, dental, and eye exams.  Stay current with your immunizations.  Do not use any tobacco products  including cigarettes, chewing tobacco, or electronic cigarettes.  If you are pregnant, do not drink alcohol.  If you are breastfeeding, limit how much and how often you drink alcohol.  Limit alcohol intake to no more than 1 drink per day for nonpregnant women. One drink equals 12 ounces of beer, 5 ounces of wine, or 1 ounces of hard liquor.  Do not use street drugs.  Do not share needles.  Ask your health care provider for help if you need support or information about quitting drugs.  Tell your health care provider if you often feel depressed.  Tell your health care provider if you have ever been abused or do not feel safe at home. This information is not intended to replace advice given to you by your health care provider. Make sure you discuss any questions you have with your health care provider. Document Released: 01/12/2011 Document Revised: 12/05/2015 Document Reviewed: 04/02/2015 Elsevier Interactive Patient Education  [employer].

## 2018-01-01 LAB — PAIN MGMT, PROFILE 8 W/CONF, U
6 Acetylmorphine: NEGATIVE ng/mL (ref <10)
ALCOHOL METABOLITES: POSITIVE ng/mL — AB (ref <500)
ALPHAHYDROXYALPRAZOLAM: 44 ng/mL — AB (ref <25)
ALPHAHYDROXYMIDAZOLAM: NEGATIVE ng/mL (ref <50)
AMPHETAMINES: NEGATIVE ng/mL (ref <500)
Alphahydroxytriazolam: NEGATIVE ng/mL (ref <50)
Aminoclonazepam: NEGATIVE ng/mL (ref <25)
BUPRENORPHINE, URINE: NEGATIVE ng/mL (ref <5)
Benzodiazepines: POSITIVE ng/mL — AB (ref <100)
COCAINE METABOLITE: NEGATIVE ng/mL (ref <150)
CREATININE: 87.4 mg/dL
ETHYL GLUCURONIDE (ETG): 28452 ng/mL — AB (ref <500)
Ethyl Sulfate (ETS): 3765 ng/mL — ABNORMAL HIGH (ref <100)
HYDROXYETHYLFLURAZEPAM: NEGATIVE ng/mL (ref <50)
LORAZEPAM: NEGATIVE ng/mL (ref <50)
MARIJUANA METABOLITE: NEGATIVE ng/mL (ref <20)
MDMA: NEGATIVE ng/mL (ref <500)
NORDIAZEPAM: 730 ng/mL — AB (ref <50)
Noroxycodone: 206 ng/mL — ABNORMAL HIGH (ref <50)
OPIATES: NEGATIVE ng/mL (ref <100)
OXIDANT: NEGATIVE ug/mL (ref <200)
OXYMORPHONE: 125 ng/mL — AB (ref <50)
Oxazepam: 1339 ng/mL — ABNORMAL HIGH (ref <50)
Oxycodone: 177 ng/mL — ABNORMAL HIGH (ref <50)
Oxycodone: POSITIVE ng/mL — AB (ref <100)
PH: 5.65 (ref 4.5–9.0)
Temazepam: 1565 ng/mL — ABNORMAL HIGH (ref <50)

## 2018-01-03 ENCOUNTER — Other Ambulatory Visit (INDEPENDENT_AMBULATORY_CARE_PROVIDER_SITE_OTHER): Payer: Medicare Other

## 2018-01-03 DIAGNOSIS — M1990 Unspecified osteoarthritis, unspecified site: Secondary | ICD-10-CM

## 2018-01-03 DIAGNOSIS — E785 Hyperlipidemia, unspecified: Secondary | ICD-10-CM | POA: Diagnosis not present

## 2018-01-03 LAB — CBC WITH DIFFERENTIAL/PLATELET
BASOS ABS: 0 10*3/uL (ref 0.0–0.1)
BASOS PCT: 0.5 % (ref 0.0–3.0)
EOS ABS: 0.2 10*3/uL (ref 0.0–0.7)
Eosinophils Relative: 2.6 % (ref 0.0–5.0)
HEMATOCRIT: 42.3 % (ref 36.0–46.0)
HEMOGLOBIN: 14.6 g/dL (ref 12.0–15.0)
LYMPHS PCT: 39 % (ref 12.0–46.0)
Lymphs Abs: 2.4 10*3/uL (ref 0.7–4.0)
MCHC: 34.5 g/dL (ref 30.0–36.0)
MCV: 93.1 fl (ref 78.0–100.0)
Monocytes Absolute: 0.5 10*3/uL (ref 0.1–1.0)
Monocytes Relative: 8.6 % (ref 3.0–12.0)
NEUTROS ABS: 3 10*3/uL (ref 1.4–7.7)
Neutrophils Relative %: 49.3 % (ref 43.0–77.0)
Platelets: 288 10*3/uL (ref 150.0–400.0)
RBC: 4.54 Mil/uL (ref 3.87–5.11)
RDW: 12.3 % (ref 11.5–15.5)
WBC: 6.2 10*3/uL (ref 4.0–10.5)

## 2018-01-03 LAB — LIPID PANEL
CHOL/HDL RATIO: 3
Cholesterol: 234 mg/dL — ABNORMAL HIGH (ref 0–200)
HDL: 67.6 mg/dL (ref >39.00)
NonHDL: 166.67
Triglycerides: 256 mg/dL — ABNORMAL HIGH (ref 0.0–149.0)
VLDL: 51.2 mg/dL — AB (ref 0.0–40.0)

## 2018-01-03 LAB — COMPREHENSIVE METABOLIC PANEL
ALK PHOS: 71 U/L (ref 39–117)
ALT: 22 U/L (ref 0–35)
AST: 21 U/L (ref 0–37)
Albumin: 4.5 g/dL (ref 3.5–5.2)
BUN: 14 mg/dL (ref 6–23)
CHLORIDE: 102 meq/L (ref 96–112)
CO2: 29 meq/L (ref 19–32)
Calcium: 9.9 mg/dL (ref 8.4–10.5)
Creatinine, Ser: 0.9 mg/dL (ref 0.40–1.20)
GFR: 68.31 mL/min (ref >60.00)
GLUCOSE: 116 mg/dL — AB (ref 70–99)
POTASSIUM: 4 meq/L (ref 3.5–5.1)
SODIUM: 141 meq/L (ref 135–145)
TOTAL PROTEIN: 7.1 g/dL (ref 6.0–8.3)
Total Bilirubin: 0.7 mg/dL (ref 0.2–1.2)

## 2018-01-03 LAB — URIC ACID: Uric Acid, Serum: 3.3 mg/dL (ref 2.4–7.0)

## 2018-01-03 LAB — TSH: TSH: 0.73 u[IU]/mL (ref 0.35–4.50)

## 2018-01-03 LAB — LDL CHOLESTEROL, DIRECT: LDL DIRECT: 127 mg/dL

## 2018-01-06 ENCOUNTER — Telehealth: Payer: Self-pay

## 2018-01-06 NOTE — Telephone Encounter (Signed)
Noted  

## 2018-01-06 NOTE — Telephone Encounter (Signed)
I called [facility] for the PA. This medication is a Plan Exclusion and there are no alternatives they cover. They will be faxing Appeal Information.

## 2018-01-07 NOTE — Telephone Encounter (Signed)
Received a faxed letter from [facility] stating the hyoscyamine 0.125 mg tab is on of the drugs excluded from Part D prescription coverage under Medicare rules.    Placed letter in Dr. Timoteo Expose box.

## 2018-01-09 ENCOUNTER — Other Ambulatory Visit: Payer: Self-pay | Admitting: Family Medicine

## 2018-01-09 MED ORDER — ALLOPURINOL 100 MG PO TABS: 50.0000 mg | ORAL_TABLET | Freq: Every day | ORAL | 3 refills | Status: DC

## 2018-01-17 ENCOUNTER — Other Ambulatory Visit: Payer: Self-pay | Admitting: Family Medicine

## 2018-01-17 NOTE — Telephone Encounter (Signed)
Name of Medication: Oxycodone-APAP #90, Alprazolam #15, Diazepam #60 Name of Pharmacy: [facility] [place] or Written Date and Quantity: 12/28/17 (see qty above) Last Office Visit and Type: 12/28/17, CPE Next Office Visit and Type: 03/31/18, pain f/u Last Controlled Substance Agreement Date: 03/27/16 Last UDS:  12/28/17

## 2018-01-17 NOTE — Telephone Encounter (Signed)
Spoke with pt relaying Dr. Timoteo Expose message.  Says she is not going to worry about it.  She's just going to stop it.

## 2018-01-17 NOTE — Telephone Encounter (Addendum)
This is for her IBS. plz notify patient - recommend she price med and pay out of pocket if affordable. Otherwise may need to stop med.

## 2018-01-19 NOTE — Telephone Encounter (Signed)
Eprescribed.

## 2018-01-24 ENCOUNTER — Ambulatory Visit
Admission: RE | Admit: 2018-01-24 | Discharge: 2018-01-24 | Disposition: A | Discharge status: Home / Self Care | Payer: Medicare Other | Source: Ambulatory Visit | Attending: Family Medicine | Admitting: Family Medicine

## 2018-01-24 DIAGNOSIS — Z1231 Encounter for screening mammogram for malignant neoplasm of breast: Secondary | ICD-10-CM | POA: Diagnosis not present

## 2018-01-24 LAB — HM MAMMOGRAPHY

## 2018-01-25 ENCOUNTER — Encounter: Payer: Self-pay | Admitting: Family Medicine

## 2018-02-14 ENCOUNTER — Other Ambulatory Visit: Payer: Self-pay | Admitting: Family Medicine

## 2018-02-14 NOTE — Telephone Encounter (Signed)
Name of Medication: Alprazolam, Diazepam, Oxycodone Name of Pharmacy: [facility] Last [place] or Written Date and Quantity: 01/27/18     Alprazolam- #15     Diazepam- #60     Oxycodone- #90 Last Office Visit and Type: 12/28/17, CPE Next Office Visit and Type: 03/31/18, pain f/u Last Controlled Substance Agreement Date: 03/27/16 Last UDS:  03/17/17

## 2018-02-15 NOTE — Telephone Encounter (Signed)
Bentyl is not on current med list.  Last OV (CPE):  12/28/17 Next OV:  03/31/18

## 2018-02-18 NOTE — Telephone Encounter (Signed)
Eprescribed.

## 2018-02-22 ENCOUNTER — Telehealth: Payer: Self-pay | Admitting: Family Medicine

## 2018-02-22 NOTE — Telephone Encounter (Signed)
Pt declines AWV. Please do not call again. She follows multiple doctors regularly and doesn't feel like this is necessary.

## 2018-03-08 ENCOUNTER — Ambulatory Visit (INDEPENDENT_AMBULATORY_CARE_PROVIDER_SITE_OTHER): Payer: Medicare Other | Admitting: Family Medicine

## 2018-03-08 ENCOUNTER — Encounter: Payer: Self-pay | Admitting: Family Medicine

## 2018-03-08 VITALS — BP 124/82 | HR 79 | Temp 98.4°F | Ht 65.0 in | Wt 209.5 lb

## 2018-03-08 DIAGNOSIS — M0600 Rheumatoid arthritis without rheumatoid factor, unspecified site: Secondary | ICD-10-CM

## 2018-03-08 DIAGNOSIS — R609 Edema, unspecified: Secondary | ICD-10-CM | POA: Diagnosis not present

## 2018-03-08 DIAGNOSIS — K581 Irritable bowel syndrome with constipation: Secondary | ICD-10-CM

## 2018-03-08 DIAGNOSIS — G894 Chronic pain syndrome: Secondary | ICD-10-CM

## 2018-03-08 DIAGNOSIS — G8929 Other chronic pain: Secondary | ICD-10-CM

## 2018-03-08 DIAGNOSIS — M1990 Unspecified osteoarthritis, unspecified site: Secondary | ICD-10-CM

## 2018-03-08 DIAGNOSIS — E785 Hyperlipidemia, unspecified: Secondary | ICD-10-CM

## 2018-03-08 MED ORDER — DICYCLOMINE HCL 10 MG PO CAPS: ORAL_CAPSULE | ORAL | 3 refills | Status: DC

## 2018-03-08 NOTE — Assessment & Plan Note (Addendum)
Continue linzess and bentyl PRN Now off levsin - insurance would not cover it

## 2018-03-08 NOTE — Assessment & Plan Note (Signed)
Reports compliance with lovaza and lipitor. Continue. Reviewed low chol diet, encouraged increase fatty fish  The 10-year ASCVD risk score Denman George DC Jr., et al., 2013) is: 2.5%   Values used to calculate the score:     Age: 56 years     Sex: Female     Is Non-Hispanic African American: No     Diabetic: No     Tobacco smoker: No     Systolic Blood Pressure: 124 mmHg     Is BP treated: No     HDL Cholesterol: 67.6 mg/dL     Total Cholesterol: 234 mg/dL

## 2018-03-08 NOTE — Patient Instructions (Addendum)
Cut allopurinol in half. May continue colchicine.  Work on [employer].  Keep legs elevated, consider compression socks for trip. Take frequent brakes during long drives.

## 2018-03-08 NOTE — Assessment & Plan Note (Addendum)
Pt considering return to new rheum. Previously saw Dr Gavin Potters for inflammatory arthritis.

## 2018-03-08 NOTE — Assessment & Plan Note (Signed)
[place] CSRS reviewed Stable period on current regimen. No meds refilled today.

## 2018-03-08 NOTE — Assessment & Plan Note (Signed)
Recently worse after car ride. This is improving however. Not consistent with DVT.  She is convinced colchicine helps edema. Will continue.

## 2018-03-08 NOTE — Assessment & Plan Note (Signed)
No known gout. Pt feels colchicine and allopurinol have helped joint pains. Will work towards slow taper of allopurinol - rec decrease to 50mg  daily and monitor arthralgias.  Lab Results  Component Value Date   LABURIC 3.3 01/03/2018

## 2018-03-08 NOTE — Progress Notes (Signed)
BP 124/82 (BP Location: Left Arm, Patient Position: Sitting, Cuff Size: Large)   Pulse 79   Temp 98.4 F (36.9 C) (Oral)   Ht 5\' 5"  (1.651 m)   Wt 209 lb 8 oz (95 kg)   SpO2 95%   BMI 34.86 kg/m    CC: chronic pain management visit Subjective:    Patient ID: Kaylee [name], female    DOB: 1961/02/29, 57 y.o.   MRN: [id number]  HPI: Kaylee [name] is a 57 y.o. female presenting on 03/08/2018 for Pain Management (Here for 3 mo pain f/u.) and Leg Swelling (C/o bilateral leg/feet swelling after long car rides. )   Chronic pain - known chronic lumbosacral pain with radiculopathy (has received 3 ESI), polyarthralgia followed by Dr Eustaquio Maize [facility] [place] pain management (but pain contract with our office). Last saw [facility] pain 09/2017. Current pain regimen is percocet 7.5/325mg  and robaxin 500mg  TID. Chronic knee pain followed by ortho - s/p knee injection and MRI. R knee posterior horn medial meniscal tear s/p partial medial meniscectomy 12/2017.   Planned 4th epidural later this week.   Noticing more trouble with pain and swelling after prolonged car ride. This is same side of recent meniscectomy. Planned f/u with ortho next month.   See recent lab results. She has been taking lipitor. She cut colchicine in half, not allopurinol.  Relevant past medical, surgical, family and social history reviewed and updated as indicated. Interim medical history since our last visit reviewed. Allergies and medications reviewed and updated. Outpatient Medications Prior to Visit  Medication Sig Dispense Refill  . acyclovir (ZOVIRAX) 400 MG tablet Take 1 tablet (400 mg total) by mouth 2 (two) times daily. 60 tablet 2  . albuterol (PROAIR HFA) 108 (90 Base) MCG/ACT inhaler INHALE 2 PUFFS INTO THE LUNGS EVERY 4 HOURS AS NEEDED 8.5 g 3  . allopurinol (ZYLOPRIM) 100 MG tablet Take 0.5 tablets (50 mg total) by mouth daily. 45 tablet 3  . Alpha-Lipoic Acid 600 MG CAPS Take 1 capsule by mouth daily.    [name] ALPRAZolam  (XANAX) 1 MG tablet TAKE 1 TABLET BY MOUTH DAILY AS NEEDED FOR ANXIETY. 15 tablet 0  . ARIPiprazole (ABILIFY) 2 MG tablet Take 1 tablet (2 mg total) by mouth daily. 90 tablet 3  . aspirin 81 MG chewable tablet Chew 1 tablet by mouth daily.    [name] atorvastatin (LIPITOR) 10 MG tablet Take 1 tablet (10 mg total) by mouth daily. 90 tablet 3  . cetirizine (ZYRTEC) 10 MG tablet Take 1 tablet (10 mg total) by mouth daily. 90 tablet 3  . colchicine 0.6 MG tablet Take 1 tablet (0.6 mg total) by mouth daily as needed. 30 tablet 2  . diazepam (VALIUM) 10 MG tablet TAKE 1 TABLET BY MOUTH TWICE A DAY 60 tablet 0  . esomeprazole (NEXIUM) 40 MG capsule Take 1 capsule (40 mg total) by mouth daily as needed. 90 capsule 3  . estradiol (ESTRACE) 0.1 MG/GM vaginal cream Apply 1 gram per vagina every night for 2 weeks, then apply three times a week 30 g 12  . etodolac (LODINE) 400 MG tablet Take 1 tablet (400 mg total) by mouth 2 (two) times daily as needed for moderate pain. 60 tablet 3  . fluconazole (DIFLUCAN) 150 MG tablet TAKE 1 TABLET BY MOUTH ONCE. MAY REPEAT IN 1 WEEK 2 tablet 0  . fluticasone (FLONASE) 50 MCG/ACT nasal spray Place 2 sprays into both nostrils daily. 16 g 11  . Fluticasone-Salmeterol (ADVAIR  DISKUS) 250-50 MCG/DOSE AEPB INHALE 1 PUFF BY MOUTH TWICE A DAY 60 each 11  . furosemide (LASIX) 20 MG tablet Take 1 tablet (20 mg total) by mouth daily as needed for edema. 90 tablet 1  . glucosamine-chondroitin 500-400 MG tablet Take 2 tablets by mouth daily. 180 tablet 3  . linaclotide (LINZESS) 290 MCG CAPS capsule Take 1 capsule (290 mcg total) by mouth daily before breakfast. 30 capsule 11  . methocarbamol (ROBAXIN) 750 MG tablet TAKE 1 TABLET BY MOUTH 2 TIMES DAILY AS NEEDED FOR MUSCLE SPASMS 60 tablet 11  . NEOMYCIN-POLYMYXIN-HYDROCORTISONE (CORTISPORIN) 1 % SOLN otic solution PLACE 3 DROPS INTO THE LEFT EAR 3 TIMES A DAY AS DIRECTED 10 mL 1  . nitrofurantoin (MACRODANTIN) 100 MG capsule Take 1 capsule  (100 mg total) by mouth daily. UTI prevention 90 capsule 1  . omega-3 acid ethyl esters (LOVAZA) 1 g capsule Take 2 capsules (2 g total) by mouth daily. 60 capsule 11  . OVER THE COUNTER MEDICATION Take 2 tablets by mouth as needed. IBgard    . oxyCODONE-acetaminophen (PERCOCET) 7.5-325 MG tablet TAKE 1 TABLET BY MOUTH EVERY 8 HOURS AS NEEDED FOR PAIN. 90 tablet 0  . potassium chloride (K-DUR) 10 MEQ tablet Take 1 tablet (10 mEq total) by mouth daily as needed (take with lasix). 90 tablet 1  . promethazine (PHENERGAN) 12.5 MG tablet Take 12.5 mg by mouth. As needed    . tiotropium (SPIRIVA HANDIHALER) 18 MCG inhalation capsule Place 1 capsule (18 mcg total) into inhaler and inhale daily. 90 capsule 3  . trimethoprim-polymyxin b (POLYTRIM) ophthalmic solution Place 1 drop into the left eye every 6 (six) hours. 10 mL 0  . dicyclomine (BENTYL) 10 MG capsule TAKE ONE CAPSULE BY MOUTH THREE TIMES DAILY AS NEEDED FOR SPASMS. TAKE MEDICATION WITH MEALS 60 capsule 0  . hyoscyamine (LEVSIN, ANASPAZ) 0.125 MG tablet Take 1 tablet (0.125 mg total) by mouth every 6 (six) hours as needed. 30 tablet 1   No facility-administered medications prior to visit.      Per HPI unless specifically indicated in ROS section below Review of Systems     Objective:    BP 124/82 (BP Location: Left Arm, Patient Position: Sitting, Cuff Size: Large)   Pulse 79   Temp 98.4 F (36.9 C) (Oral)   Ht 5\' 5"  (1.651 m)   Wt 209 lb 8 oz (95 kg)   SpO2 95%   BMI 34.86 kg/m   Wt Readings from Last 3 Encounters:  03/08/18 209 lb 8 oz (95 kg)  12/28/17 206 lb 8 oz (93.7 kg)  09/23/17 201 lb (91.2 kg)    Physical Exam  Constitutional: She appears well-developed and well-nourished. No distress.  Musculoskeletal: Normal range of motion. She exhibits no edema.  Some swelling R popliteal area with tenderness to palpation FROM at R knee 2 + DP on right  Nursing note and vitals reviewed.  Lab Results  Component Value Date    CHOL 234 (H) 01/03/2018   HDL 67.60 01/03/2018   LDLCALC 100 (H) 03/27/2016   LDLDIRECT 127.0 01/03/2018   TRIG 256.0 (H) 01/03/2018   CHOLHDL 3 01/03/2018       Assessment & Plan:   Problem List Items Addressed This Visit    Seronegative rheumatoid arthritis (HCC)    Pt considering return to new rheum. Previously saw Dr 01/05/2018 for inflammatory arthritis.       Peripheral edema    Recently worse after car ride. This  is improving however. Not consistent with DVT.  She is convinced colchicine helps edema. Will continue.       Irritable bowel syndrome with constipation    Continue linzess and bentyl PRN Now off levsin - insurance would not cover it      Relevant Medications   dicyclomine (BENTYL) 10 MG capsule   HLD (hyperlipidemia)    Reports compliance with lovaza and lipitor. Continue. Reviewed low chol diet, encouraged increase fatty fish  The 10-year ASCVD risk score Denman George DC Jr., et al., 2013) is: 2.5%   Values used to calculate the score:     Age: 1 years     Sex: Female     Is Non-Hispanic African American: No     Diabetic: No     Tobacco smoker: No     Systolic Blood Pressure: 124 mmHg     Is BP treated: No     HDL Cholesterol: 67.6 mg/dL     Total Cholesterol: 234 mg/dL       Encounter for chronic pain management - Primary    [place] CSRS reviewed Stable period on current regimen. No meds refilled today.       Chronic pain syndrome   Chronic inflammatory arthritis    No known gout. Pt feels colchicine and allopurinol have helped joint pains. Will work towards slow taper of allopurinol - rec decrease to 50mg  daily and monitor arthralgias.  Lab Results  Component Value Date   LABURIC 3.3 01/03/2018            Meds ordered this encounter  Medications  . dicyclomine (BENTYL) 10 MG capsule    Sig: TAKE ONE CAPSULE BY MOUTH THREE TIMES DAILY AS NEEDED FOR SPASMS. TAKE MEDICATION WITH MEALS    Dispense:  60 capsule    Refill:  3   No orders of the  defined types were placed in this encounter.   Follow up plan: No follow-ups on file.  01/05/2018, MD

## 2018-03-10 DIAGNOSIS — M5417 Radiculopathy, lumbosacral region: Secondary | ICD-10-CM | POA: Diagnosis not present

## 2018-03-10 DIAGNOSIS — M4727 Other spondylosis with radiculopathy, lumbosacral region: Secondary | ICD-10-CM | POA: Diagnosis not present

## 2018-03-10 DIAGNOSIS — M4807 Spinal stenosis, lumbosacral region: Secondary | ICD-10-CM | POA: Diagnosis not present

## 2018-03-18 ENCOUNTER — Other Ambulatory Visit: Payer: Self-pay | Admitting: Family Medicine

## 2018-03-18 NOTE — Telephone Encounter (Signed)
Name of Medication: Alprazolam, Diazepam, Oxycodone Name of Pharmacy: [facility] [address] or Written Date and Quantity:      Alparazolam:  02/26/18, #15     Diazepam:  02/26/18, #60     Oxycodone:  02/26/18, #90 Last Office Visit and Type: 03/08/18, pain f/u Next Office Visit and Type: 05/30/18, pain f/u Last Controlled Substance Agreement Date: 03/27/16 Last UDS: 12/28/17

## 2018-03-21 NOTE — Telephone Encounter (Signed)
Eprescribed.

## 2018-03-31 ENCOUNTER — Ambulatory Visit: Payer: Self-pay | Admitting: Family Medicine

## 2018-04-18 ENCOUNTER — Other Ambulatory Visit: Payer: Self-pay | Admitting: Family Medicine

## 2018-04-18 NOTE — Telephone Encounter (Signed)
Name of Medication: Oxycodone-APAP, Diazepam Name of Pharmacy: [facility] [address] or Written Date and Quantity:     Oxycodone-APAP- #90, 03/28/18    Diazepam- #60, 03/28/18 Last Office Visit and Type: 03/08/18, 3 mo pain f/u Next Office Visit and Type: 05/27/18, 3 mo pain f/u Last Controlled Substance Agreement Date: 03/27/16 Last UDS: 12/28/17

## 2018-04-20 NOTE — Telephone Encounter (Signed)
E prescribed to fill on 04/27/2018

## 2018-04-26 DIAGNOSIS — M545 Low back pain: Secondary | ICD-10-CM | POA: Diagnosis not present

## 2018-04-26 DIAGNOSIS — G8929 Other chronic pain: Secondary | ICD-10-CM | POA: Diagnosis not present

## 2018-04-26 DIAGNOSIS — M47816 Spondylosis without myelopathy or radiculopathy, lumbar region: Secondary | ICD-10-CM | POA: Diagnosis not present

## 2018-04-27 ENCOUNTER — Other Ambulatory Visit: Payer: Self-pay | Admitting: Family Medicine

## 2018-04-28 ENCOUNTER — Other Ambulatory Visit: Payer: Self-pay | Admitting: Family Medicine

## 2018-04-29 NOTE — Telephone Encounter (Signed)
Name of Medication: Alprazolam Name of Pharmacy: [facility] [address] or Written Date and Quantity: 03/28/18, #15 Last Office Visit and Type: 03/08/18, pain f/u Next Office Visit and Type: 05/27/18, pain f/u Last Controlled Substance Agreement Date: 03/27/16 Last UDS: 12/28/17

## 2018-04-30 NOTE — Telephone Encounter (Signed)
Eprescribed.

## 2018-05-03 ENCOUNTER — Other Ambulatory Visit: Payer: Self-pay | Admitting: Internal Medicine

## 2018-05-03 ENCOUNTER — Ambulatory Visit (INDEPENDENT_AMBULATORY_CARE_PROVIDER_SITE_OTHER)
Admission: RE | Admit: 2018-05-03 | Discharge: 2018-05-03 | Disposition: A | Discharge status: Home / Self Care | Payer: Medicare Other | Source: Ambulatory Visit | Attending: Internal Medicine | Admitting: Internal Medicine

## 2018-05-03 ENCOUNTER — Encounter: Payer: Self-pay | Admitting: Internal Medicine

## 2018-05-03 ENCOUNTER — Ambulatory Visit (INDEPENDENT_AMBULATORY_CARE_PROVIDER_SITE_OTHER): Payer: Medicare Other | Admitting: Internal Medicine

## 2018-05-03 VITALS — BP 126/84 | HR 97 | Temp 98.2°F | Wt 216.0 lb

## 2018-05-03 DIAGNOSIS — M79674 Pain in right toe(s): Secondary | ICD-10-CM

## 2018-05-03 DIAGNOSIS — S92421A Displaced fracture of distal phalanx of right great toe, initial encounter for closed fracture: Secondary | ICD-10-CM

## 2018-05-03 DIAGNOSIS — M7989 Other specified soft tissue disorders: Secondary | ICD-10-CM

## 2018-05-03 DIAGNOSIS — L03031 Cellulitis of right toe: Secondary | ICD-10-CM

## 2018-05-03 MED ORDER — FLUCONAZOLE 150 MG PO TABS: 150.0000 mg | ORAL_TABLET | Freq: Once | ORAL | 0 refills | Status: AC

## 2018-05-03 MED ORDER — AMOXICILLIN-POT CLAVULANATE 875-125 MG PO TABS: 1.0000 | ORAL_TABLET | Freq: Two times a day (BID) | ORAL | 0 refills | Status: DC
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
## 2018-05-03 NOTE — Patient Instructions (Signed)
Paronychia  Paronychia is an infection of the skin. It happens near a fingernail or toenail. It may cause pain and swelling around the nail. Usually, it is not serious and it clears up with treatment.  Follow these instructions at home:   Soak the fingers or toes in warm water as told by your doctor. You may be told to do this for 20 minutes, 2-3 times a day.   Keep the area dry when you are not soaking it.   Take medicines only as told by your doctor.   If you were given an antibiotic medicine, finish all of it even if you start to feel better.   Keep the affected area clean.   Do not try to drain a fluid-filled bump yourself.   Wear rubber gloves when putting your hands in water.   Wear gloves if your hands might touch cleaners or chemicals.   Follow your doctor's instructions about:  ? Wound care.  ? Bandage (dressing) changes and removal.  Contact a doctor if:   Your symptoms get worse or do not improve.   You have a fever or chills.   You have redness spreading from the affected area.   You have more fluid, blood, or pus coming from the affected area.   Your finger or knuckle is swollen or is hard to move.  This information is not intended to replace advice given to you by your health care provider. Make sure you discuss any questions you have with your health care provider.  Document Released: 06/17/2009 Document Revised: 12/05/2015 Document Reviewed: 06/06/2014  Elsevier Interactive Patient Education  2018 [employer]

## 2018-05-05 DIAGNOSIS — S92405A Nondisplaced unspecified fracture of left great toe, initial encounter for closed fracture: Secondary | ICD-10-CM | POA: Diagnosis not present

## 2018-05-11 ENCOUNTER — Encounter: Payer: Self-pay | Admitting: Family Medicine

## 2018-05-11 DIAGNOSIS — S92911A Unspecified fracture of right toe(s), initial encounter for closed fracture: Secondary | ICD-10-CM | POA: Insufficient documentation

## 2018-05-19 ENCOUNTER — Other Ambulatory Visit: Payer: Self-pay | Admitting: Family Medicine

## 2018-05-19 NOTE — Telephone Encounter (Signed)
Name of Medication: Alprazolam, Acyclovir, Colchicine, Diazepam Oxycodone-APAP   Name of Pharmacy:  [facility] [place] or Written Date and Quantity:      Alprazalom: 04/30/18, #15/0     Acyclovir:  04/27/18, #60/2     Diazepam:  04/27/18, #60/0     Oxycodone-APAP:  04/27/18, #90/0     Colchicine:  04/27/18, #30/0 Last Office Visit and Type: 05/03/18, acute with Baity Next Office Visit and Type: 05/27/18, 3 mo pain f/u Last Controlled Substance Agreement Date: 03/27/16 Last UDS:  12/28/17

## 2018-05-25 NOTE — Telephone Encounter (Signed)
Eprescribed.

## 2018-05-26 NOTE — Assessment & Plan Note (Addendum)
[place] CSRS reviewed.  Update UDS today  

## 2018-05-26 NOTE — Progress Notes (Signed)
BP 130/82 (BP Location: Left Arm, Patient Position: Sitting, Cuff Size: Large)   Pulse 82   Temp 98.4 F (36.9 C) (Oral)   Ht 5\' 5"  (1.651 m)   Wt 215 lb 4 oz (97.6 kg)   SpO2 98%   BMI 35.82 kg/m    CC: chronic pain f/u visit Subjective:    Patient ID: Kaylee [name], female    DOB: [birth year], 57 y.o.   MRN: [id number]  HPI: Kaylee [name] is a 57 y.o. female presenting on 05/27/2018 for Pain Management (Here for 3 mo pain management f/u.)   Seen by Rene Kocher last week with R great toe pain, paronychia treated with augmentin 10d course. Xray showed subacute distal phalanx fracture of great toe. She was referred to ortho - [facility]. She has not been using CAM walker boot.   Unable to cut allopurinol dosing - due to worsening arthralgias. Desires to continue 100mg  dose. Overall feels she is at a good place with her current arthritis medications.   Chronic pain - known chronic lumbosacral pain with radiculopathy (has received 4 ESI), polyarthralgia followed by Dr Eustaquio Maize [facility] pain management (but pain contract with our office). Last saw [facility] pain 04/2018, note reviewed. Current pain regimen is percocet 7.5/325mg  and robaxin 500mg  TID.   Had 1 mixed drink yesterday  Relevant past medical, surgical, family and social history reviewed and updated as indicated. Interim medical history since our last visit reviewed. Allergies and medications reviewed and updated. Outpatient Medications Prior to Visit  Medication Sig Dispense Refill  . acyclovir (ZOVIRAX) 400 MG tablet TAKE 1 TABLET BY MOUTH TWICE A DAY 60 tablet 2  . albuterol (PROAIR HFA) 108 (90 Base) MCG/ACT inhaler INHALE 2 PUFFS INTO THE LUNGS EVERY 4 HOURS AS NEEDED 8.5 g 3  . Alpha-Lipoic Acid 600 MG CAPS Take 1 capsule by mouth daily.    [name] ALPRAZolam (XANAX) 1 MG tablet TAKE 1 TABLET BY MOUTH DAILY AS NEEDED FOR ANXIETY. 15 tablet 0  . amoxicillin-clavulanate (AUGMENTIN) 875-125 MG tablet Take 1 tablet by mouth 2 (two)  times daily. 20 tablet 0  . ARIPiprazole (ABILIFY) 2 MG tablet Take 1 tablet (2 mg total) by mouth daily. 90 tablet 3  . aspirin 81 MG chewable tablet Chew 1 tablet by mouth daily.    [name] atorvastatin (LIPITOR) 10 MG tablet Take 1 tablet (10 mg total) by mouth daily. 90 tablet 3  . cetirizine (ZYRTEC) 10 MG tablet Take 1 tablet (10 mg total) by mouth daily. 90 tablet 3  . colchicine 0.6 MG tablet TAKE 1 TABLET BY MOUTH EVERY DAY AS NEEDED 30 tablet 0  . diazepam (VALIUM) 10 MG tablet TAKE 1 TABLET BY MOUTH TWICE A DAY 60 tablet 0  . dicyclomine (BENTYL) 10 MG capsule TAKE ONE CAPSULE BY MOUTH THREE TIMES DAILY AS NEEDED FOR SPASMS. TAKE MEDICATION WITH MEALS 60 capsule 3  . esomeprazole (NEXIUM) 40 MG capsule Take 1 capsule (40 mg total) by mouth daily as needed. 90 capsule 3  . estradiol (ESTRACE) 0.1 MG/GM vaginal cream Apply 1 gram per vagina every night for 2 weeks, then apply three times a week 30 g 12  . etodolac (LODINE) 400 MG tablet Take 1 tablet (400 mg total) by mouth 2 (two) times daily as needed for moderate pain. 60 tablet 3  . fluticasone (FLONASE) 50 MCG/ACT nasal spray Place 2 sprays into both nostrils daily. 16 g 11  . Fluticasone-Salmeterol (ADVAIR DISKUS) 250-50 MCG/DOSE AEPB INHALE 1 PUFF  BY MOUTH TWICE A DAY 60 each 11  . furosemide (LASIX) 20 MG tablet Take 1 tablet (20 mg total) by mouth daily as needed for edema. 90 tablet 1  . glucosamine-chondroitin 500-400 MG tablet Take 2 tablets by mouth daily. 180 tablet 3  . linaclotide (LINZESS) 290 MCG CAPS capsule Take 1 capsule (290 mcg total) by mouth daily before breakfast. 30 capsule 11  . methocarbamol (ROBAXIN) 750 MG tablet TAKE 1 TABLET BY MOUTH 2 TIMES DAILY AS NEEDED FOR MUSCLE SPASMS 60 tablet 11  . NEOMYCIN-POLYMYXIN-HYDROCORTISONE (CORTISPORIN) 1 % SOLN otic solution PLACE 3 DROPS INTO THE LEFT EAR 3 TIMES A DAY AS DIRECTED 10 mL 1  . nitrofurantoin (MACRODANTIN) 100 MG capsule Take 1 capsule (100 mg total) by mouth  daily. UTI prevention 90 capsule 1  . omega-3 acid ethyl esters (LOVAZA) 1 g capsule Take 2 capsules (2 g total) by mouth daily. 60 capsule 11  . OVER THE COUNTER MEDICATION Take 2 tablets by mouth as needed. IBgard    . oxyCODONE-acetaminophen (PERCOCET) 7.5-325 MG tablet TAKE 1 TABLET BY MOUTH EVERY 8 HOURS AS NEEDED FOR PAIN 90 tablet 0  . potassium chloride (K-DUR) 10 MEQ tablet Take 1 tablet (10 mEq total) by mouth daily as needed (take with lasix). 90 tablet 1  . promethazine (PHENERGAN) 12.5 MG tablet Take 12.5 mg by mouth. As needed    . tiotropium (SPIRIVA HANDIHALER) 18 MCG inhalation capsule Place 1 capsule (18 mcg total) into inhaler and inhale daily. 90 capsule 3  . trimethoprim-polymyxin b (POLYTRIM) ophthalmic solution Place 1 drop into the left eye every 6 (six) hours. 10 mL 0  . allopurinol (ZYLOPRIM) 100 MG tablet Take 0.5 tablets (50 mg total) by mouth daily. 45 tablet 3   No facility-administered medications prior to visit.      Per HPI unless specifically indicated in ROS section below Review of Systems     Objective:    BP 130/82 (BP Location: Left Arm, Patient Position: Sitting, Cuff Size: Large)   Pulse 82   Temp 98.4 F (36.9 C) (Oral)   Ht 5\' 5"  (1.651 m)   Wt 215 lb 4 oz (97.6 kg)   SpO2 98%   BMI 35.82 kg/m   Wt Readings from Last 3 Encounters:  05/27/18 215 lb 4 oz (97.6 kg)  05/03/18 216 lb (98 kg)  03/08/18 209 lb 8 oz (95 kg)    Physical Exam  Constitutional: She appears well-developed and well-nourished. No distress.  HENT:  Mouth/Throat: Oropharynx is clear and moist. No oropharyngeal exudate.  Cardiovascular: Normal rate, regular rhythm and normal heart sounds.  No murmur heard. Pulmonary/Chest: Effort normal and breath sounds normal. No respiratory distress. She has no wheezes. She has no rales.  Musculoskeletal: She exhibits no edema.  Psychiatric: She has a normal mood and affect.  Nursing note and vitals reviewed.  Results for orders  placed or performed in visit on 01/25/18  HM MAMMOGRAPHY  Result Value Ref Range   HM Mammogram 0-4 Bi-Rad 0-4 Bi-Rad, Self Reported Normal      Assessment & Plan:   Problem List Items Addressed This Visit    Encounter for chronic pain management - Primary    [place] CSRS reviewed Update UDS today.       Closed fracture of right toe    Appreciate ortho care. rec regular CAM walker boot use (not wearing today).       Chronic pain syndrome    Osteoarthritis, seronegative RA.  On chronic narcotic therapy. Also sees [facility] pain management for interventional treatment.       Chronic inflammatory arthritis    Continue allopurinol 100mg  daily.       Relevant Medications   allopurinol (ZYLOPRIM) 100 MG tablet   Back pain with left-sided sciatica       Meds ordered this encounter  Medications  . allopurinol (ZYLOPRIM) 100 MG tablet    Sig: Take 1 tablet (100 mg total) by mouth daily.    Dispense:  90 tablet    Refill:  3    Note new sig   No orders of the defined types were placed in this encounter.   Follow up plan: Return in about 3 months (around 08/27/2018), or if symptoms worsen or fail to improve, for follow up visit.  08/29/2018, MD

## 2018-05-27 ENCOUNTER — Ambulatory Visit (INDEPENDENT_AMBULATORY_CARE_PROVIDER_SITE_OTHER): Payer: Medicare Other | Admitting: Family Medicine

## 2018-05-27 ENCOUNTER — Encounter: Payer: Self-pay | Admitting: Family Medicine

## 2018-05-27 VITALS — BP 130/82 | HR 82 | Temp 98.4°F | Ht 65.0 in | Wt 215.2 lb

## 2018-05-27 DIAGNOSIS — M1990 Unspecified osteoarthritis, unspecified site: Secondary | ICD-10-CM | POA: Diagnosis not present

## 2018-05-27 DIAGNOSIS — G8929 Other chronic pain: Secondary | ICD-10-CM | POA: Diagnosis not present

## 2018-05-27 DIAGNOSIS — G894 Chronic pain syndrome: Secondary | ICD-10-CM

## 2018-05-27 DIAGNOSIS — S92404D Nondisplaced unspecified fracture of right great toe, subsequent encounter for fracture with routine healing: Secondary | ICD-10-CM | POA: Diagnosis not present

## 2018-05-27 DIAGNOSIS — M5432 Sciatica, left side: Secondary | ICD-10-CM

## 2018-05-27 MED ORDER — ALLOPURINOL 100 MG PO TABS: 100.0000 mg | ORAL_TABLET | Freq: Every day | ORAL | 3 refills | Status: DC

## 2018-05-27 NOTE — Addendum Note (Signed)
Addended by: Nanci Pina on: 05/27/2018 09:37 AM   Modules accepted: Orders

## 2018-05-27 NOTE — Assessment & Plan Note (Addendum)
Appreciate ortho care. rec regular CAM walker boot use (not wearing today).

## 2018-05-27 NOTE — Patient Instructions (Signed)
UDS today You are doing well today Use CAM walker boot as much as you can.

## 2018-05-27 NOTE — Assessment & Plan Note (Addendum)
Osteoarthritis, seronegative RA. On chronic narcotic therapy. Also sees [facility] pain management for interventional treatment.

## 2018-05-27 NOTE — Assessment & Plan Note (Signed)
Continue allopurinol 100 mg daily. 

## 2018-05-30 ENCOUNTER — Ambulatory Visit: Payer: Self-pay | Admitting: Family Medicine

## 2018-05-31 LAB — PAIN MGMT, PROFILE 8 W/CONF, U
6 ACETYLMORPHINE: NEGATIVE ng/mL (ref <10)
ALPHAHYDROXYMIDAZOLAM: NEGATIVE ng/mL (ref <50)
ALPHAHYDROXYTRIAZOLAM: NEGATIVE ng/mL (ref <50)
AMPHETAMINES: NEGATIVE ng/mL (ref <500)
Alcohol Metabolites: POSITIVE ng/mL — AB (ref <500)
Alphahydroxyalprazolam: NEGATIVE ng/mL (ref <25)
Aminoclonazepam: NEGATIVE ng/mL (ref <25)
Benzodiazepines: POSITIVE ng/mL — AB (ref <100)
Buprenorphine, Urine: NEGATIVE ng/mL (ref <5)
COCAINE METABOLITE: NEGATIVE ng/mL (ref <150)
CODEINE: NEGATIVE ng/mL (ref <50)
Creatinine: 109.2 mg/dL
ETHYL GLUCURONIDE (ETG): 36793 ng/mL — AB (ref <500)
Ethyl Sulfate (ETS): 5333 ng/mL — ABNORMAL HIGH (ref <100)
Hydrocodone: NEGATIVE ng/mL (ref <50)
Hydromorphone: NEGATIVE ng/mL (ref <50)
Hydroxyethylflurazepam: NEGATIVE ng/mL (ref <50)
Lorazepam: NEGATIVE ng/mL (ref <50)
MARIJUANA METABOLITE: NEGATIVE ng/mL (ref <20)
MDMA: NEGATIVE ng/mL (ref <500)
Morphine: NEGATIVE ng/mL (ref <50)
NORDIAZEPAM: 1263 ng/mL — AB (ref <50)
NORHYDROCODONE: NEGATIVE ng/mL (ref <50)
NOROXYCODONE: 649 ng/mL — AB (ref <50)
OXAZEPAM: 3203 ng/mL — AB (ref <50)
OXIDANT: NEGATIVE ug/mL (ref <200)
OXYMORPHONE: 243 ng/mL — AB (ref <50)
Opiates: NEGATIVE ng/mL (ref <100)
Oxycodone: >20000 ng/mL — ABNORMAL HIGH (ref <50)
Oxycodone: POSITIVE ng/mL — AB (ref <100)
Temazepam: 3824 ng/mL — ABNORMAL HIGH (ref <50)
pH: 5.72 (ref 4.5–9.0)

## 2018-06-21 ENCOUNTER — Other Ambulatory Visit: Payer: Self-pay | Admitting: Family Medicine

## 2018-06-21 NOTE — Telephone Encounter (Signed)
Name of Medication: Alprazolam, Diazepam, Oxycodone-APAP Name of Pharmacy: [facility] Last [place] or Written Date and Quantity:        Alprazolam- 05/26/18, #15/0       Diazepam- 05/26/18, #60/0       Oxycodone-APAP- 05/26/18, #90/0 Last Office Visit and Type: 05/27/18, 3 mo pain f/u Next Office Visit and Type: 08/26/18, 3 mo pain f/u Last Controlled Substance Agreement Date: 03/27/16 Last UDS: 05/27/18

## 2018-06-23 NOTE — Telephone Encounter (Signed)
Eprescribed.

## 2018-06-24 ENCOUNTER — Other Ambulatory Visit: Payer: Self-pay | Admitting: Family Medicine

## 2018-06-28 DIAGNOSIS — M47816 Spondylosis without myelopathy or radiculopathy, lumbar region: Secondary | ICD-10-CM | POA: Diagnosis not present

## 2018-06-28 DIAGNOSIS — M47817 Spondylosis without myelopathy or radiculopathy, lumbosacral region: Secondary | ICD-10-CM | POA: Diagnosis not present

## 2018-07-19 ENCOUNTER — Other Ambulatory Visit: Payer: Self-pay | Admitting: Family Medicine

## 2018-07-19 NOTE — Telephone Encounter (Signed)
Name of Medication: Alprazolam, Diazepam, Oxycodone-APAP Name of Pharmacy: [facility] Last [place] or Written Date and Quantity:      Alprazolam- 06/24/18, #15/0     Diazepam- 06/24/18, #60/0     Oxycodone-APAP- 06/24/18, #90/0 Last Office Visit and Type: 05/27/18, chronic pain f/u Next Office Visit and Type: 08/26/17, chronic pain f/u Last Controlled Substance Agreement Date: 03/27/16 Last UDS: 12/28/17  Bentyl last filled:  06/24/18, #60/3 Colchincine last filled:  06/24/18, #30/0 Etodolac last filled:  06/24/18, #60/3 Nitrofurantoin last filled:  05/26/18, #90/1

## 2018-07-22 NOTE — Telephone Encounter (Signed)
Eprescribed.

## 2018-07-28 DIAGNOSIS — M47816 Spondylosis without myelopathy or radiculopathy, lumbar region: Secondary | ICD-10-CM | POA: Diagnosis not present

## 2018-07-28 DIAGNOSIS — Z87891 Personal history of nicotine dependence: Secondary | ICD-10-CM | POA: Diagnosis not present

## 2018-07-28 DIAGNOSIS — Z79899 Other long term (current) drug therapy: Secondary | ICD-10-CM | POA: Diagnosis not present

## 2018-08-11 DIAGNOSIS — M47817 Spondylosis without myelopathy or radiculopathy, lumbosacral region: Secondary | ICD-10-CM | POA: Diagnosis not present

## 2018-08-11 DIAGNOSIS — M47816 Spondylosis without myelopathy or radiculopathy, lumbar region: Secondary | ICD-10-CM | POA: Diagnosis not present

## 2018-08-15 ENCOUNTER — Other Ambulatory Visit: Payer: Self-pay | Admitting: Family Medicine

## 2018-08-15 NOTE — Telephone Encounter (Signed)
Name of Medication: Alprazolam, Diazepam, Oxycodone-APAP Name of Pharmacy: [facility] Last [place] or Written Date and Quantity:      Alprazolam- 07/22/18, #10/0     Diazepam- 07/22/18, #60/0     Oxycodone-APAP- 07/22/18, #90/0 Last Office Visit and Type: 05/27/18, chronic pain f/u Next Office Visit and Type: 08/17/18, chronic pain f/u Last Controlled Substance Agreement Date: 03/27/16 Last UDS: 12/28/17

## 2018-08-17 ENCOUNTER — Ambulatory Visit (INDEPENDENT_AMBULATORY_CARE_PROVIDER_SITE_OTHER): Payer: Medicare Other | Admitting: Family Medicine

## 2018-08-17 ENCOUNTER — Encounter: Payer: Self-pay | Admitting: Family Medicine

## 2018-08-17 VITALS — BP 124/72 | HR 86 | Temp 98.0°F | Ht 65.0 in | Wt 214.5 lb

## 2018-08-17 DIAGNOSIS — N3020 Other chronic cystitis without hematuria: Secondary | ICD-10-CM

## 2018-08-17 DIAGNOSIS — F411 Generalized anxiety disorder: Secondary | ICD-10-CM

## 2018-08-17 DIAGNOSIS — G8929 Other chronic pain: Secondary | ICD-10-CM

## 2018-08-17 DIAGNOSIS — K5909 Other constipation: Secondary | ICD-10-CM

## 2018-08-17 DIAGNOSIS — M5432 Sciatica, left side: Secondary | ICD-10-CM

## 2018-08-17 DIAGNOSIS — G894 Chronic pain syndrome: Secondary | ICD-10-CM | POA: Diagnosis not present

## 2018-08-17 DIAGNOSIS — Z6835 Body mass index (BMI) 35.0-35.9, adult: Secondary | ICD-10-CM

## 2018-08-17 DIAGNOSIS — F319 Bipolar disorder, unspecified: Secondary | ICD-10-CM

## 2018-08-17 NOTE — Assessment & Plan Note (Signed)
Chronic, on abilify, valium 10mg  bid and xanax PRN rarely for anxiety attacks. Has tried and failed other treatments.

## 2018-08-17 NOTE — Assessment & Plan Note (Signed)
On nitrofurantoin 100mg  daily ppx longterm. Reviewed pulmonary risks of long term use. She decides to continue this medication despite this risk.

## 2018-08-17 NOTE — Progress Notes (Signed)
BP 124/72 (BP Location: Left Arm, Patient Position: Sitting, Cuff Size: Large)   Pulse 86   Temp 98 F (36.7 C) (Oral)   Ht 5\' 5"  (1.651 m)   Wt 214 lb 8 oz (97.3 kg)   SpO2 97%   BMI 35.69 kg/m    CC: 3 mo chronic pain f/u visit Subjective:    Patient ID: Kaylee [name], female    DOB: June 29, 1961, 58 y.o.   MRN: [id number]  HPI: Kaylee [name] is a 58 y.o. female presenting on 08/17/2018 for Chronic Pain Management (Here for 3 mo f/u.)   Tumultuous last few months - left husband because he was on drugs.   Chronic pain - known chronic lumbosacral pain with radiculopathy(has received ESI), polyarthralgia followed by Dr Eustaquio Maize [facility] pain management (but pain contract with our office). Current pain regimen is percocet 7.5/325mg  and robaxin 500mg  TID. H/o chronic inflammatory arthritis (osteoarthritis, seronegative RA). Has seen Dr Gavin Potters rheum in the past.   Just had steroid injections into spine then L sided nerve ablation pending R side. Planned f/u next week with pain management Dr Eustaquio Maize. Seems to have some benefit for a few weeks at a time. Unsure if ablation has helped.   Tolerates narcotic well, no oversedation, constipation managed with PRN linzess (about twice a month).   Alcohol - had a 6 pack yesterday, but endorses about once a month. Drinks wine or beer not hard liquor. Knows not to mix alcohol with narcotic or benzo      Relevant past medical, surgical, family and social history reviewed and updated as indicated. Interim medical history since our last visit reviewed. Allergies and medications reviewed and updated. Outpatient Medications Prior to Visit  Medication Sig Dispense Refill  . acyclovir (ZOVIRAX) 400 MG tablet TAKE 1 TABLET BY MOUTH TWICE A DAY 60 tablet 2  . albuterol (PROAIR HFA) 108 (90 Base) MCG/ACT inhaler INHALE 2 PUFFS INTO THE LUNGS EVERY 4 HOURS AS NEEDED 8.5 g 3  . allopurinol (ZYLOPRIM) 100 MG tablet Take 1 tablet (100 mg total) by mouth  daily. 90 tablet 3  . Alpha-Lipoic Acid 600 MG CAPS Take 1 capsule by mouth daily.    [name] ALPRAZolam (XANAX) 1 MG tablet TAKE 1 TABLET BY MOUTH DAILY AS NEEDED FOR ANXIETY. 10 tablet 0  . amoxicillin-clavulanate (AUGMENTIN) 875-125 MG tablet Take 1 tablet by mouth 2 (two) times daily. 20 tablet 0  . ARIPiprazole (ABILIFY) 2 MG tablet Take 1 tablet (2 mg total) by mouth daily. 90 tablet 3  . aspirin 81 MG chewable tablet Chew 1 tablet by mouth daily.    [name] atorvastatin (LIPITOR) 10 MG tablet Take 1 tablet (10 mg total) by mouth daily. 90 tablet 3  . cetirizine (ZYRTEC) 10 MG tablet Take 1 tablet (10 mg total) by mouth daily. 90 tablet 3  . colchicine 0.6 MG tablet TAKE 1 TABLET BY MOUTH EVERY DAY AS NEEDED 30 tablet 0  . diazepam (VALIUM) 10 MG tablet TAKE 1 TABLET BY MOUTH TWICE A DAY 60 tablet 0  . dicyclomine (BENTYL) 10 MG capsule TAKE 1 CAPSULE BY MOUTH 3 TIMES DAILY ASNEEDED FOR SPASMS. TAKE MEDICATION WITH MEALS. 60 capsule 3  . esomeprazole (NEXIUM) 40 MG capsule Take 1 capsule (40 mg total) by mouth daily as needed. 90 capsule 3  . estradiol (ESTRACE) 0.1 MG/GM vaginal cream Apply 1 gram per vagina every night for 2 weeks, then apply three times a week 30 g 12  . etodolac (  LODINE) 400 MG tablet TAKE 1 TABLET BY MOUTH TWICE A DAY AS NEEDED FOR MODERATE PAIN 60 tablet 3  . fluticasone (FLONASE) 50 MCG/ACT nasal spray Place 2 sprays into both nostrils daily. 16 g 11  . Fluticasone-Salmeterol (ADVAIR DISKUS) 250-50 MCG/DOSE AEPB INHALE 1 PUFF BY MOUTH TWICE A DAY 60 each 11  . furosemide (LASIX) 20 MG tablet Take 1 tablet (20 mg total) by mouth daily as needed for edema. 90 tablet 1  . glucosamine-chondroitin 500-400 MG tablet Take 2 tablets by mouth daily. 180 tablet 3  . linaclotide (LINZESS) 290 MCG CAPS capsule Take 1 capsule (290 mcg total) by mouth daily before breakfast. 30 capsule 11  . methocarbamol (ROBAXIN) 750 MG tablet TAKE 1 TABLET BY MOUTH 2 TIMES DAILY AS NEEDED FOR MUSCLE SPASMS  60 tablet 11  . NEOMYCIN-POLYMYXIN-HYDROCORTISONE (CORTISPORIN) 1 % SOLN otic solution PLACE 3 DROPS INTO THE LEFT EAR 3 TIMES A DAY AS DIRECTED 10 mL 1  . nitrofurantoin (MACRODANTIN) 100 MG capsule TAKE 1 CAPSULE BY MOUTH DAILY FOR UTI PREVENTION 90 capsule 1  . omega-3 acid ethyl esters (LOVAZA) 1 g capsule Take 2 capsules (2 g total) by mouth daily. 60 capsule 11  . OVER THE COUNTER MEDICATION Take 2 tablets by mouth as needed. IBgard    . oxyCODONE-acetaminophen (PERCOCET) 7.5-325 MG tablet TAKE 1 TABLET BY MOUTH EVERY 8 HOURS AS NEEDED FOR PAIN 90 tablet 0  . potassium chloride (K-DUR) 10 MEQ tablet Take 1 tablet (10 mEq total) by mouth daily as needed (take with lasix). 90 tablet 1  . promethazine (PHENERGAN) 12.5 MG tablet Take 12.5 mg by mouth. As needed    . tiotropium (SPIRIVA HANDIHALER) 18 MCG inhalation capsule Place 1 capsule (18 mcg total) into inhaler and inhale daily. 90 capsule 3  . trimethoprim-polymyxin b (POLYTRIM) ophthalmic solution Place 1 drop into the left eye every 6 (six) hours. 10 mL 0   No facility-administered medications prior to visit.      Per HPI unless specifically indicated in ROS section below Review of Systems Objective:    BP 124/72 (BP Location: Left Arm, Patient Position: Sitting, Cuff Size: Large)   Pulse 86   Temp 98 F (36.7 C) (Oral)   Ht 5\' 5"  (1.651 m)   Wt 214 lb 8 oz (97.3 kg)   SpO2 97%   BMI 35.69 kg/m   Wt Readings from Last 3 Encounters:  08/17/18 214 lb 8 oz (97.3 kg)  05/27/18 215 lb 4 oz (97.6 kg)  05/03/18 216 lb (98 kg)    Physical Exam Vitals signs and nursing note reviewed.  Constitutional:      Appearance: Normal appearance.  HENT:     Mouth/Throat:     Mouth: Mucous membranes are moist.     Pharynx: Oropharynx is clear.  Eyes:     Conjunctiva/sclera: Conjunctivae normal.     Pupils: Pupils are equal, round, and reactive to light.  Cardiovascular:     Rate and Rhythm: Normal rate and regular rhythm.      Pulses: Normal pulses.     Heart sounds: Normal heart sounds. No murmur.  Pulmonary:     Effort: Pulmonary effort is normal. No respiratory distress.     Breath sounds: Normal breath sounds. No wheezing, rhonchi or rales.  Skin:    Findings: No rash.  Neurological:     Mental Status: She is alert.  Psychiatric:        Mood and Affect: Mood normal.  Assessment & Plan:   Problem List Items Addressed This Visit    Severe obesity (BMI 35.0-35.9 with comorbidity) (HCC)   GAD (generalized anxiety disorder)    Chronic, on abilify, valium 10mg  bid and xanax PRN rarely for anxiety attacks. Has tried and failed other treatments.       Encounter for chronic pain management - Primary     CSRS reviewed. Continue narcotic.  Receiving steroid injections and nerve ablation through pain management.       Chronic pain syndrome   Chronic cystitis    On nitrofurantoin 100mg  daily ppx longterm. Reviewed pulmonary risks of long term use. She decides to continue this medication despite this risk.       Chronic constipation    Anticipate narcotic induced - controlled with rare linzess      Bipolar 1 disorder (HCC)    On abilify 2mg  daily. Previously saw Dr Lolly MustacheArfeen. We have discussed refilling medication as long as she is stable on current dose. If deteriorated, would recommend return to psych.       Back pain with left-sided sciatica       No orders of the defined types were placed in this encounter.  No orders of the defined types were placed in this encounter.   Follow up plan: No follow-ups on file.  Eustaquio BoydenJavier Eisa Necaise, MD

## 2018-08-17 NOTE — Assessment & Plan Note (Signed)
Anticipate narcotic induced - controlled with rare linzess

## 2018-08-17 NOTE — Telephone Encounter (Signed)
E prescribed. Seen today.

## 2018-08-17 NOTE — Assessment & Plan Note (Addendum)
[place] CSRS reviewed. Continue narcotic.  Receiving steroid injections and nerve ablation through pain management.

## 2018-08-17 NOTE — Assessment & Plan Note (Signed)
On abilify 2mg  daily. Previously saw Dr Lolly Mustache. We have discussed refilling medication as long as she is stable on current dose. If deteriorated, would recommend return to psych.

## 2018-08-17 NOTE — Patient Instructions (Addendum)
Medicines refilled today.  Good to see you today - continue current medicines.  Return as needed or in 3 months for follow up visit.

## 2018-08-19 ENCOUNTER — Other Ambulatory Visit: Payer: Self-pay | Admitting: Family Medicine

## 2018-08-23 DIAGNOSIS — M47817 Spondylosis without myelopathy or radiculopathy, lumbosacral region: Secondary | ICD-10-CM | POA: Diagnosis not present

## 2018-08-23 DIAGNOSIS — M129 Arthropathy, unspecified: Secondary | ICD-10-CM | POA: Diagnosis not present

## 2018-08-23 DIAGNOSIS — M47816 Spondylosis without myelopathy or radiculopathy, lumbar region: Secondary | ICD-10-CM | POA: Diagnosis not present

## 2018-08-26 ENCOUNTER — Ambulatory Visit: Payer: Self-pay | Admitting: Family Medicine

## 2018-09-03 ENCOUNTER — Other Ambulatory Visit: Payer: Self-pay | Admitting: Family Medicine

## 2018-09-05 NOTE — Telephone Encounter (Signed)
Name of Medication:  Oxycodone-APAP-last filled on 08/17/2018 #90.    Alprazolam last filled on 08/17/2018 #10    Diazepam last filled 08/17/2018 #60.  [facility]  Last Office Visit and Type: 08/17/2018 for chronic pain management Next Office Visit and Type: 11/17/2018 Last Controlled Substance Agreement Date: 04/27/2017  Last UDS: 05/27/2018

## 2018-09-06 NOTE — Telephone Encounter (Signed)
plz call to see when meds last refilled at pharmacy.

## 2018-09-07 NOTE — Telephone Encounter (Signed)
Spoke with [facility] asking meds.  Told they were last refilled on 08/19/18.  However, the pt called them 09/02/18 asking that refill requests be sent to Dr. Reece Agar to have rxs on file when it is time to have them filled.

## 2018-09-09 NOTE — Telephone Encounter (Signed)
E perscribed 

## 2018-09-30 ENCOUNTER — Other Ambulatory Visit: Payer: Self-pay

## 2018-09-30 NOTE — Patient Outreach (Signed)
[facility]) Care Management  09/30/2018  Kaylee [name] Oct 31, 1960 [id number]   Medication Adherence call to Kaylee [name] spoke with patient she is due on Atorvastatin 10 mg patient explain she is still taking 1 tablet daily and just pick up from pharmacy patient does not want to get a 90 days supply at this time until she talks her her doctor. Kaylee [name] is showing past due under [facility].    Lillia Abed CPhT Pharmacy Technician [facility] Care Management Direct Dial [phone]  Fax [phone] [email or web address]

## 2018-10-10 DIAGNOSIS — G894 Chronic pain syndrome: Secondary | ICD-10-CM | POA: Diagnosis not present

## 2018-10-10 DIAGNOSIS — M545 Low back pain: Secondary | ICD-10-CM | POA: Diagnosis not present

## 2018-10-10 DIAGNOSIS — M47816 Spondylosis without myelopathy or radiculopathy, lumbar region: Secondary | ICD-10-CM | POA: Diagnosis not present

## 2018-10-12 ENCOUNTER — Other Ambulatory Visit: Payer: Self-pay

## 2018-10-12 ENCOUNTER — Telehealth: Payer: Self-pay

## 2018-10-12 ENCOUNTER — Ambulatory Visit (INDEPENDENT_AMBULATORY_CARE_PROVIDER_SITE_OTHER): Payer: Medicare Other | Admitting: Family Medicine

## 2018-10-12 ENCOUNTER — Other Ambulatory Visit: Payer: Self-pay | Admitting: Family Medicine

## 2018-10-12 ENCOUNTER — Encounter: Payer: Self-pay | Admitting: Family Medicine

## 2018-10-12 ENCOUNTER — Other Ambulatory Visit: Payer: Self-pay | Admitting: Internal Medicine

## 2018-10-12 DIAGNOSIS — M5432 Sciatica, left side: Secondary | ICD-10-CM

## 2018-10-12 DIAGNOSIS — J209 Acute bronchitis, unspecified: Secondary | ICD-10-CM

## 2018-10-12 MED ORDER — TIZANIDINE HCL 4 MG PO TABS: 4.0000 mg | ORAL_TABLET | Freq: Two times a day (BID) | ORAL | 3 refills | Status: DC | PRN

## 2018-10-12 NOTE — Telephone Encounter (Signed)
Will talk then.

## 2018-10-12 NOTE — Progress Notes (Signed)
   Kaylee [name] - 58 y.o. female  MRN [id number]  Date of Birth: August 26, 1960  PCP: [name], MD  This service was provided via telemedicine. Phone Visit performed on 10/12/2018    Rationale for phone visit along with limitations reviewed. Patient consented to telephone encounter.   Location of patient: home Location of provider: office,  @ [facility] Name of referring provider: N/A  Names of persons and role in encounter: Provider: [name], MD  Patient: Kaylee [name]  Other: N/A   Time on call: 12:37pm - 12:45pm   Subjective: CC: asking about change in muscle relaxant HPI: Acute flare of lower back pain over the past month. Had multiple steroid injections with benefit over the past 2 months but back pain persists. She had nerve ablation on left. Sees Dr Eustaquio Maize at [facility] - they canceled recent visit this week due to coronavirus pandemic. She has been unable to get in touch with pain management about med changes.   Ran out of robaxin (has refills at home) but states zanaflex seems to work better - requesting switch in muscle relaxant.   Chronic pain - known chronic lumbosacral pain with radiculopathy(has receivedESI), polyarthralgia followed by Dr Eustaquio Maize [facility] pain management (but pain contract with our office). Current pain regimen is percocet 7.5/325mg  and robaxin 500mg  TID - to change to zanaflex 4mg  .H/o chronic inflammatory arthritis (osteoarthritis, seronegative RA). Has seen Dr Gavin Potters rheum in the past.    Objective/Observations:   No physical exam or vital signs collected unless specifically identified below.   There were no vitals taken for this visit.   Respiratory status: speaks in complete sentences without evident shortness of breath.   Assessment/Plan:  Back pain with left-sided sciatica Acute back pain with spasms - pt requests change in muscle relaxant - as she finds tizanidine works better than her methocarbamol. Reasonable  to switch. Reviewed sedation precautions, will send in to take 4mg  BID PRN #60. Pt agrees with plan.    I discussed the assessment and treatment plan with the patient. The patient was provided an opportunity to ask questions and all were answered. The patient agreed with the plan and demonstrated an understanding of the instructions.  Lab Orders  No laboratory test(s) ordered today    Meds ordered this encounter  Medications  . tiZANidine (ZANAFLEX) 4 MG tablet    Sig: Take 1 tablet (4 mg total) by mouth 2 (two) times daily as needed for muscle spasms.    Dispense:  60 tablet    Refill:  3    In place of robaxin. Formulate based on pt affordability/insurance preference    The patient was advised to call back or seek an in-person evaluation if the symptoms worsen or if the condition fails to improve as anticipated.  [name], MD

## 2018-10-12 NOTE — Telephone Encounter (Signed)
Received inbound call from patient requesting pain medication management. Concerns with pinched nerve in back. Pain scale: 8/10. Increasing pain over previous 30 days.   States she is unable to reach pain medicine physician and would like a prescription for Xanaflex.  Patient stated she was unable to complete a virtual video at this time. Appointment scheduled 10/12/18 @ 1230

## 2018-10-12 NOTE — Assessment & Plan Note (Signed)
Acute back pain with spasms - pt requests change in muscle relaxant - as she finds tizanidine works better than her methocarbamol. Reasonable to switch. Reviewed sedation precautions, will send in to take 4mg  BID PRN #60. Pt agrees with plan.

## 2018-10-17 ENCOUNTER — Other Ambulatory Visit: Payer: Self-pay | Admitting: Family Medicine

## 2018-10-17 NOTE — Telephone Encounter (Signed)
Requested medication (s) are due for refill today: yes  Requested medication (s) are on the active medication list: yes  Last refill:  09/09/18 for all 3 rx requested  Future visit scheduled: yes  Notes to clinic:  Medications not delegated to NT to refill   Requested Prescriptions  Pending Prescriptions Disp Refills   oxyCODONE-acetaminophen (PERCOCET) 7.5-325 MG tablet [Pharmacy Med Name: OXYCODONE-APAP 7.5-325 MG TAB] 90 tablet     Sig: TAKE 1 TABLET BY MOUTH EVERY 8 HOURS AS NEEDED FOR PAIN     There is no refill protocol information for this order     ALPRAZolam (XANAX) 1 MG tablet [Pharmacy Med Name: ALPRAZOLAM 1 MG TAB] 10 tablet     Sig: TAKE 1 TABLET BY MOUTH EVERY DAY AS NEEDED FOR ANXIETY     There is no refill protocol information for this order     diazepam (VALIUM) 10 MG tablet [Pharmacy Med Name: DIAZEPAM 10 MG TAB] 60 tablet     Sig: TAKE 1 TABLET BY MOUTH TWICE A DAY     There is no refill protocol information for this order

## 2018-10-17 NOTE — Telephone Encounter (Signed)
Patient left a voicemail stating that these medications are due for refill tomorrow and needs this done. Patient wants a call back when this has been taken care of

## 2018-10-17 NOTE — Telephone Encounter (Signed)
Electronic refill request Name of Medication: Oxycodone, Alprazolam, Diazepam Name of Pharmacy: [facility] Last Fill or Written Date and Quantity: Oxycodone LR 09/09/18 #90, Alprazolam LR 09/09/18 #10, Diazepam 09/09/18 #60 Last Office Visit and Type: 10/12/18 acuate Next Office Visit and Type: none scheduled Last Controlled Substance Agreement Date: 04/27/17 Last UDS:05/27/18

## 2018-10-18 ENCOUNTER — Telehealth: Payer: Self-pay

## 2018-10-18 NOTE — Telephone Encounter (Signed)
Spoke with pt notifying her rxs were refilled.

## 2018-10-18 NOTE — Telephone Encounter (Signed)
plz notify pt this was E prescribed

## 2018-10-18 NOTE — Telephone Encounter (Signed)
[facility] Night - Client TELEPHONE ADVICE RECORD [facility] Patient Name: Kaylee [name] Gender: Female DOB: 03-23-1961 Age: 59 Y 11 M 11 D Return Phone Number: [phone] (Primary) Address: City/State/Zip: [place] [address] Client [facility] Night - Client Client Site [facility] - Night Physician [name] - MD Contact Type Call Who Is Calling Patient / Member / Family / Caregiver Call Type Triage / Clinical Relationship To Patient Self Return Phone Number [phone] (Primary) Chief Complaint Prescription Refill or Medication Request (non symptomatic) Reason for Call Medication Question / Request Initial Comment Caller states she is out of all her medication and needs doctor to call in her meds. Patient has a appointment on May 7th. Medical [employer] [phone] Translation No Nurse Assessment Guidelines Guideline Title Affirmed Question Affirmed Notes Nurse Date/Time (Eastern Time) Disp. Time Lamount Cohen Time) Disposition Final User 10/17/2018 7:04:39 PM Attempt made - no message left Willeen Cass, RN, Lelon Mast 10/17/2018 7:15:12 PM Attempt made - line busy Willeen Cass, RN, Lelon Mast 10/17/2018 7:15:02 PM FINAL ATTEMPT MADE - no message left Yes Willeen Cass, RN, Lelon Mast

## 2018-10-18 NOTE — Telephone Encounter (Signed)
Spoke with pt asking if she has checked with the pharmacy. Says they called her a little while ago saying refills are ready. Expresses her thanks for the call.

## 2018-10-18 NOTE — Telephone Encounter (Signed)
Forwarding request to PCP

## 2018-10-24 ENCOUNTER — Other Ambulatory Visit: Payer: Self-pay

## 2018-10-24 NOTE — Patient Outreach (Signed)
[facility]) Care Management  10/24/2018  Marlenne A Ballow 03/20/1961 [id number]   Medication Adherence call to Mrs. Bryla Wind patient did not answer patient is due on Simvastatin 40 mg pharmacy said last pick up was on 08/2018 for a 30 days supply. Mrs. Mauzey is showing past due under [facility] Ins.   Lillia Abed CPhT Pharmacy Technician [facility] Management Direct Dial [phone]  Fax [phone] [email or web address]

## 2018-11-04 ENCOUNTER — Telehealth: Payer: Self-pay

## 2018-11-04 ENCOUNTER — Other Ambulatory Visit: Payer: Self-pay | Admitting: Family Medicine

## 2018-11-04 DIAGNOSIS — M5432 Sciatica, left side: Secondary | ICD-10-CM

## 2018-11-04 DIAGNOSIS — M0600 Rheumatoid arthritis without rheumatoid factor, unspecified site: Secondary | ICD-10-CM

## 2018-11-04 DIAGNOSIS — M1990 Unspecified osteoarthritis, unspecified site: Secondary | ICD-10-CM

## 2018-11-04 NOTE — Telephone Encounter (Signed)
I don't fill this medicine.  If she desires to take it will need to return to rheum.  Let us know if she desires referral.

## 2018-11-04 NOTE — Telephone Encounter (Signed)
LOV: 08/17/18 Pain management

## 2018-11-04 NOTE — Telephone Encounter (Signed)
Pt left v/m that Lodine is no longer effective and pt request refill plaquenil 200 mg taking bid; Dr Gavin Potters gave pt that med before and pt had old rx and restarted taking the plaquenil 200 mg bid about 2 wks ago and pain is much better. Pt cannot go to back doctor now and her knee was very swollen but since taking the plaquenil pt is m;uch better and [facility]. Pt request cb.

## 2018-11-04 NOTE — Telephone Encounter (Signed)
Spoke with pt relaying Dr. Timoteo Expose message. Pt requests referral to rheumatology, Dr. Gavin Potters.

## 2018-11-06 NOTE — Telephone Encounter (Signed)
Eprescribed.

## 2018-11-06 NOTE — Addendum Note (Signed)
Addended by: [name] on: 11/06/2018 09:52 PM   Modules accepted: Orders

## 2018-11-07 DIAGNOSIS — M47816 Spondylosis without myelopathy or radiculopathy, lumbar region: Secondary | ICD-10-CM | POA: Diagnosis not present

## 2018-11-07 DIAGNOSIS — M461 Sacroiliitis, not elsewhere classified: Secondary | ICD-10-CM | POA: Diagnosis not present

## 2018-11-07 DIAGNOSIS — M545 Low back pain: Secondary | ICD-10-CM | POA: Diagnosis not present

## 2018-11-17 ENCOUNTER — Encounter: Payer: Self-pay | Admitting: Family Medicine

## 2018-11-17 ENCOUNTER — Ambulatory Visit (INDEPENDENT_AMBULATORY_CARE_PROVIDER_SITE_OTHER): Payer: Medicare Other | Admitting: Family Medicine

## 2018-11-17 VITALS — Temp 98.6°F | Ht 65.0 in | Wt 207.0 lb

## 2018-11-17 DIAGNOSIS — M5432 Sciatica, left side: Secondary | ICD-10-CM

## 2018-11-17 DIAGNOSIS — G8929 Other chronic pain: Secondary | ICD-10-CM | POA: Diagnosis not present

## 2018-11-17 DIAGNOSIS — M25552 Pain in left hip: Secondary | ICD-10-CM | POA: Diagnosis not present

## 2018-11-17 DIAGNOSIS — Z7289 Other problems related to lifestyle: Secondary | ICD-10-CM

## 2018-11-17 DIAGNOSIS — Z789 Other specified health status: Secondary | ICD-10-CM

## 2018-11-17 DIAGNOSIS — F1090 Alcohol use, unspecified, uncomplicated: Secondary | ICD-10-CM | POA: Insufficient documentation

## 2018-11-17 DIAGNOSIS — G894 Chronic pain syndrome: Secondary | ICD-10-CM

## 2018-11-17 NOTE — Assessment & Plan Note (Signed)
We have previously reviewed avoiding alcohol/narcotic. She states she now has alcohol free household as husband is also avoiding drinking due to alcohol abuse history.

## 2018-11-17 NOTE — Assessment & Plan Note (Signed)
Upcoming imaging through [facility] pain clinic.

## 2018-11-17 NOTE — Assessment & Plan Note (Signed)
Appreciate [facility] pain clinic assistance. Upcoming hip/pelvic xrays for further evaluation.

## 2018-11-17 NOTE — Assessment & Plan Note (Signed)
[place] CSRS reviewed.  Stable on narcotic regimen - will continue.

## 2018-11-17 NOTE — Progress Notes (Signed)
Virtual visit completed through [employer]. Due to national recommendations of social distancing due to COVID 19, a virtual visit is felt to be most appropriate for this patient at this time.   Patient location: home Provider location: [place] [facility], office If any vitals were documented, they were collected by patient at home unless specified below.    Temp 98.6 F (37 C) (Oral)   Ht 5\' 5"  (1.651 m)   Wt 207 lb (93.9 kg)   BMI 34.45 kg/m    CC: 64mo chronic pain management visit Subjective:    Patient ID: Kaylee [name], female    DOB: [birth year], 58 y.o.   MRN: [id number]  HPI: Fatimah A Zettel is a 58 y.o. female presenting on 11/17/2018 for Chronic Pain Management (3 mo f/u.)   Back together with husband - he just returned from [place] and rehab stay now on medicines and doing well off alcohol/drugs.   Acute lower back pain flare last month - we conducted phone visit and prescribed zanaflex in place of robaxin (pt felt this was more effective).   Chronic pain - known chronic lumbosacral pain with radiculopathy(has receivedESI), polyarthralgia followed by Dr Maximino Sarinhidgey [facility] pain management (but narcotics managed and on pain contract through our office). Current pain regimen is percocet 7.5/325mg  and zanaflex 4mg  BID PRN.H/o chronic inflammatory arthritis (osteoarthritis, seronegative RA). [facility]umin the past.  [facility] due to ongoing L hip/pelvis pain.   Tolerates narcotic well without oversedation - constipation managed with linzess (about 2x/mo). Knows to not mix with alcohol. She has stopped alcohol as well - alcohol free household.      Relevant past medical, surgical, family and social history reviewed and updated as indicated. Interim medical history since our last visit reviewed. Allergies and medications reviewed and updated. Outpatient Medications Prior to Visit  Medication Sig Dispense Refill  . acyclovir (ZOVIRAX) 400  MG tablet TAKE 1 TABLET BY MOUTH TWICE A DAY 60 tablet 2  . albuterol (PROAIR HFA) 108 (90 Base) MCG/ACT inhaler INHALE 2 PUFFS INTO THE LUNGS EVERY 4 HOURS AS NEEDED 8.5 g 3  . allopurinol (ZYLOPRIM) 100 MG tablet Take 1 tablet (100 mg total) by mouth daily. 90 tablet 3  . Alpha-Lipoic Acid 600 MG CAPS Take 1 capsule by mouth daily.    [name]. ALPRAZolam (XANAX) 1 MG tablet TAKE 1 TABLET BY MOUTH EVERY DAY AS NEEDED FOR ANXIETY 10 tablet 0  . ARIPiprazole (ABILIFY) 2 MG tablet Take 1 tablet (2 mg total) by mouth daily. 90 tablet 3  . aspirin 81 MG chewable tablet Chew 1 tablet by mouth daily.    [name]. atorvastatin (LIPITOR) 10 MG tablet Take 1 tablet (10 mg total) by mouth daily. 90 tablet 3  . cetirizine (ZYRTEC) 10 MG tablet Take 1 tablet (10 mg total) by mouth daily. 90 tablet 3  . colchicine 0.6 MG tablet TAKE 1 TABLET BY MOUTH EVERY DAY AS NEEDED 30 tablet 0  . diazepam (VALIUM) 10 MG tablet TAKE 1 TABLET BY MOUTH TWICE A DAY 60 tablet 0  . dicyclomine (BENTYL) 10 MG capsule TAKE 1 CAPSULE BY MOUTH 3 TIMES DAILY ASNEEDED FOR SPASMS. TAKE MEDICATION WITH MEALS. 60 capsule 3  . esomeprazole (NEXIUM) 40 MG capsule Take 1 capsule (40 mg total) by mouth daily as needed. 90 capsule 3  . estradiol (ESTRACE) 0.1 MG/GM vaginal cream Apply 1 gram per vagina every night for 2 weeks, then apply three times  a week 30 g 12  . etodolac (LODINE) 400 MG tablet TAKE 1 TABLET BY MOUTH TWICE A DAY AS NEEDED FOR MODERATE PAIN 60 tablet 3  . fluticasone (FLONASE) 50 MCG/ACT nasal spray Place 2 sprays into both nostrils daily. 16 g 11  . Fluticasone-Salmeterol (ADVAIR DISKUS) 250-50 MCG/DOSE AEPB INHALE 1 PUFF BY MOUTH TWICE A DAY 60 each 11  . furosemide (LASIX) 20 MG tablet TAKE 1 TABLET BY MOUTH EVERY DAY AS NEEDED. 90 tablet 0  . glucosamine-chondroitin 500-400 MG tablet Take 2 tablets by mouth daily. 180 tablet 3  . linaclotide (LINZESS) 290 MCG CAPS capsule Take 1 capsule (290 mcg total) by mouth daily before  breakfast. 30 capsule 11  . NEOMYCIN-POLYMYXIN-HYDROCORTISONE (CORTISPORIN) 1 % SOLN otic solution PLACE 3 DROPS INTO THE LEFT EAR 3 TIMES A DAY AS DIRECTED 10 mL 1  . nitrofurantoin (MACRODANTIN) 100 MG capsule TAKE 1 CAPSULE BY MOUTH DAILY FOR UTI PREVENTION 90 capsule 1  . omega-3 acid ethyl esters (LOVAZA) 1 g capsule Take 2 capsules (2 g total) by mouth daily. 60 capsule 11  . OVER THE COUNTER MEDICATION Take 2 tablets by mouth as needed. IBgard    . oxyCODONE-acetaminophen (PERCOCET) 7.5-325 MG tablet TAKE 1 TABLET BY MOUTH EVERY 8 HOURS AS NEEDED FOR PAIN 90 tablet 0  . potassium chloride (K-DUR) 10 MEQ tablet Take 1 tablet (10 mEq total) by mouth daily as needed (take with lasix). 90 tablet 1  . promethazine (PHENERGAN) 12.5 MG tablet Take 12.5 mg by mouth. As needed    . tiotropium (SPIRIVA HANDIHALER) 18 MCG inhalation capsule Place 1 capsule (18 mcg total) into inhaler and inhale daily. 90 capsule 3  . tiZANidine (ZANAFLEX) 4 MG tablet Take 1 tablet (4 mg total) by mouth 2 (two) times daily as needed for muscle spasms. 60 tablet 3  . trimethoprim-polymyxin b (POLYTRIM) ophthalmic solution Place 1 drop into the left eye every 6 (six) hours. 10 mL 0  . amoxicillin-clavulanate (AUGMENTIN) 875-125 MG tablet Take 1 tablet by mouth 2 (two) times daily. 20 tablet 0   No facility-administered medications prior to visit.      Per HPI unless specifically indicated in ROS section below Review of Systems Objective:    Temp 98.6 F (37 C) (Oral)   Ht 5\' 5"  (1.651 m)   Wt 207 lb (93.9 kg)   BMI 34.45 kg/m   Wt Readings from Last 3 Encounters:  11/17/18 207 lb (93.9 kg)  08/17/18 214 lb 8 oz (97.3 kg)  05/27/18 215 lb 4 oz (97.6 kg)     Physical exam: Gen: alert, NAD, not ill appearing Pulm: speaks in complete sentences without increased work of breathing Psych: normal mood, normal thought content      Assessment & Plan:  RTC 3 mo CPE/AMW Problem List Items Addressed This Visit     Pain in joint involving pelvic region and thigh    Upcoming imaging through [facility] pain clinic.       Encounter for chronic pain management - Primary    [place] CSRS reviewed.  Stable on narcotic regimen - will continue.       Chronic pain syndrome   Back pain with left-sided sciatica    Appreciate [facility] pain clinic assistance. Upcoming hip/pelvic xrays for further evaluation.       Alcohol use    We have previously reviewed avoiding alcohol/narcotic. She states she now has alcohol free household as husband is also avoiding drinking due to alcohol  abuse history.           No orders of the defined types were placed in this encounter.  No orders of the defined types were placed in this encounter.   Follow up plan: Return in about 3 months (around 02/17/2019) for annual exam, prior fasting for blood work, medicare wellness visit.  [name], MD

## 2018-11-23 DIAGNOSIS — M461 Sacroiliitis, not elsewhere classified: Secondary | ICD-10-CM | POA: Diagnosis not present

## 2018-11-23 DIAGNOSIS — M533 Sacrococcygeal disorders, not elsewhere classified: Secondary | ICD-10-CM | POA: Diagnosis not present

## 2018-11-23 DIAGNOSIS — M5137 Other intervertebral disc degeneration, lumbosacral region: Secondary | ICD-10-CM | POA: Diagnosis not present

## 2018-11-23 DIAGNOSIS — M47817 Spondylosis without myelopathy or radiculopathy, lumbosacral region: Secondary | ICD-10-CM | POA: Diagnosis not present

## 2018-11-28 ENCOUNTER — Telehealth: Payer: Self-pay | Admitting: Family Medicine

## 2018-11-28 ENCOUNTER — Other Ambulatory Visit: Payer: Medicare Other

## 2018-11-28 ENCOUNTER — Encounter: Payer: Self-pay | Admitting: Family Medicine

## 2018-11-28 ENCOUNTER — Ambulatory Visit (INDEPENDENT_AMBULATORY_CARE_PROVIDER_SITE_OTHER): Payer: Medicare Other | Admitting: Family Medicine

## 2018-11-28 VITALS — Ht 65.0 in

## 2018-11-28 DIAGNOSIS — R6889 Other general symptoms and signs: Secondary | ICD-10-CM | POA: Diagnosis not present

## 2018-11-28 DIAGNOSIS — N3941 Urge incontinence: Secondary | ICD-10-CM | POA: Insufficient documentation

## 2018-11-28 DIAGNOSIS — Z20822 Contact with and (suspected) exposure to covid-19: Secondary | ICD-10-CM

## 2018-11-28 NOTE — Assessment & Plan Note (Addendum)
Presumed Covid19 illness.  Supportive care reviewed.  Red flags to seek medical care reviewed.  Will call in 2 days to monitor symptoms.  Continue stay at home. Update if not improving with treatment. Pt agrees with plan.

## 2018-11-28 NOTE — Progress Notes (Signed)
Virtual visit completed through [employer]. Due to national recommendations of social distancing due to COVID-19, a virtual visit is felt to be most appropriate for this patient at this time. Interactive audio and video telecommunications were attempted between myself and Jakyla A Leyland, however failed due to patient having technical difficulties. We continued and completed visit with audio only.   Patient location: home Provider location: [place] at [facility], office If any vitals were documented, they were collected by patient at home unless specified below.    Ht 5\' 5"  (1.651 m)    BMI 34.45 kg/m    CC: cough, fever Subjective:    Patient ID: Sheral A Whelpley, female    DOB: 1960/07/15, 58 y.o.   MRN: [id number]  HPI: Kourtnie A Bruning is a 58 y.o. female presenting on 11/28/2018 for Cough; Headache; and Fever (C/o fever, chills, N/V/D and fatigue. )   5-6d h/o dry cough, loss of taste and smell, nausea/vomiting, loose stools, fevers/chills. Diffuse body aches. Today is first day she's been up, started eating solids.   Husband started with diffuse rash throughout body. He also has had fever/chill and dry cough.   Declines testing for this.  No known exposure to Covid19.      Relevant past medical, surgical, family and social history reviewed and updated as indicated. Interim medical history since our last visit reviewed. Allergies and medications reviewed and updated. Outpatient Medications Prior to Visit  Medication Sig Dispense Refill   acyclovir (ZOVIRAX) 400 MG tablet TAKE 1 TABLET BY MOUTH TWICE A DAY 60 tablet 2   albuterol (PROAIR HFA) 108 (90 Base) MCG/ACT inhaler INHALE 2 PUFFS INTO THE LUNGS EVERY 4 HOURS AS NEEDED 8.5 g 3   allopurinol (ZYLOPRIM) 100 MG tablet Take 1 tablet (100 mg total) by mouth daily. 90 tablet 3   Alpha-Lipoic Acid 600 MG CAPS Take 1 capsule by mouth daily.     ALPRAZolam (XANAX) 1 MG tablet TAKE 1 TABLET BY MOUTH EVERY DAY AS NEEDED FOR  ANXIETY 10 tablet 0   ARIPiprazole (ABILIFY) 2 MG tablet Take 1 tablet (2 mg total) by mouth daily. 90 tablet 3   aspirin 81 MG chewable tablet Chew 1 tablet by mouth daily.     atorvastatin (LIPITOR) 10 MG tablet Take 1 tablet (10 mg total) by mouth daily. 90 tablet 3   cetirizine (ZYRTEC) 10 MG tablet Take 1 tablet (10 mg total) by mouth daily. 90 tablet 3   colchicine 0.6 MG tablet TAKE 1 TABLET BY MOUTH EVERY DAY AS NEEDED 30 tablet 0   diazepam (VALIUM) 10 MG tablet TAKE 1 TABLET BY MOUTH TWICE A DAY 60 tablet 0   dicyclomine (BENTYL) 10 MG capsule TAKE 1 CAPSULE BY MOUTH 3 TIMES DAILY ASNEEDED FOR SPASMS. TAKE MEDICATION WITH MEALS. 60 capsule 3   esomeprazole (NEXIUM) 40 MG capsule Take 1 capsule (40 mg total) by mouth daily as needed. 90 capsule 3   estradiol (ESTRACE) 0.1 MG/GM vaginal cream Apply 1 gram per vagina every night for 2 weeks, then apply three times a week 30 g 12   etodolac (LODINE) 400 MG tablet TAKE 1 TABLET BY MOUTH TWICE A DAY AS NEEDED FOR MODERATE PAIN 60 tablet 3   fluticasone (FLONASE) 50 MCG/ACT nasal spray Place 2 sprays into both nostrils daily. 16 g 11   Fluticasone-Salmeterol (ADVAIR DISKUS) 250-50 MCG/DOSE AEPB INHALE 1 PUFF BY MOUTH TWICE A DAY 60 each 11   furosemide (LASIX) 20 MG tablet TAKE 1  TABLET BY MOUTH EVERY DAY AS NEEDED. 90 tablet 0   glucosamine-chondroitin 500-400 MG tablet Take 2 tablets by mouth daily. 180 tablet 3   linaclotide (LINZESS) 290 MCG CAPS capsule Take 1 capsule (290 mcg total) by mouth daily before breakfast. 30 capsule 11   NEOMYCIN-POLYMYXIN-HYDROCORTISONE (CORTISPORIN) 1 % SOLN otic solution PLACE 3 DROPS INTO THE LEFT EAR 3 TIMES A DAY AS DIRECTED 10 mL 1   nitrofurantoin (MACRODANTIN) 100 MG capsule TAKE 1 CAPSULE BY MOUTH DAILY FOR UTI PREVENTION 90 capsule 1   omega-3 acid ethyl esters (LOVAZA) 1 g capsule Take 2 capsules (2 g total) by mouth daily. 60 capsule 11   OVER THE COUNTER MEDICATION Take 2 tablets  by mouth as needed. IBgard     oxyCODONE-acetaminophen (PERCOCET) 7.5-325 MG tablet TAKE 1 TABLET BY MOUTH EVERY 8 HOURS AS NEEDED FOR PAIN 90 tablet 0   potassium chloride (K-DUR) 10 MEQ tablet Take 1 tablet (10 mEq total) by mouth daily as needed (take with lasix). 90 tablet 1   promethazine (PHENERGAN) 12.5 MG tablet Take 12.5 mg by mouth. As needed     tiotropium (SPIRIVA HANDIHALER) 18 MCG inhalation capsule Place 1 capsule (18 mcg total) into inhaler and inhale daily. 90 capsule 3   tiZANidine (ZANAFLEX) 4 MG tablet Take 1 tablet (4 mg total) by mouth 2 (two) times daily as needed for muscle spasms. 60 tablet 3   trimethoprim-polymyxin b (POLYTRIM) ophthalmic solution Place 1 drop into the left eye every 6 (six) hours. 10 mL 0   No facility-administered medications prior to visit.      Per HPI unless specifically indicated in ROS section below Review of Systems Objective:    Ht 5\' 5"  (1.651 m)    BMI 34.45 kg/m   Wt Readings from Last 3 Encounters:  11/17/18 207 lb (93.9 kg)  08/17/18 214 lb 8 oz (97.3 kg)  05/27/18 215 lb 4 oz (97.6 kg)     Physical exam: Gen: alert, NAD, not ill appearing Pulm: speaks in complete sentences without increased work of breathing Psych: normal mood, normal thought content      Assessment & Plan:   Problem List Items Addressed This Visit    Suspected Covid-19 Virus Infection - Primary    Presumed Covid19 illness.  Supportive care reviewed.  Red flags to seek medical care reviewed.  Will call in 2 days to monitor symptoms.  Continue stay at home. Update if not improving with treatment. Pt agrees with plan.           No orders of the defined types were placed in this encounter.  No orders of the defined types were placed in this encounter.   Follow up plan: Return if symptoms worsen or fail to improve.  [name], MD

## 2018-11-28 NOTE — Telephone Encounter (Signed)
plz call Wed for update on symptoms. Presumed [phone] given symptoms, pt declined testing

## 2018-11-29 NOTE — Telephone Encounter (Signed)
Noted  

## 2018-11-30 NOTE — Telephone Encounter (Signed)
Noted  

## 2018-11-30 NOTE — Telephone Encounter (Signed)
Spoke with pt asking how she is doing.  Says she's feeling better. Able to keep food/liquids down. Still has a little diarrhea and fatigue. Staying quarantined.

## 2018-11-30 NOTE — Telephone Encounter (Signed)
Noted. Thanks. Let's call again on Monday for update.

## 2018-12-02 ENCOUNTER — Other Ambulatory Visit: Payer: Medicare Other

## 2018-12-06 ENCOUNTER — Other Ambulatory Visit: Payer: Self-pay | Admitting: Family Medicine

## 2018-12-06 NOTE — Telephone Encounter (Signed)
Spoke with pt asking how she is doing.  Says she is doing better but had some diarrhea last night.   Also, pt is concerned that she still needs to do UDS and contract. Asking if she still needs to do this asap or can she wait until physical in Aug?

## 2018-12-06 NOTE — Telephone Encounter (Signed)
Would try to schedule in mid -June when feeling better.

## 2018-12-06 NOTE — Telephone Encounter (Signed)
Spoke with pt relaying Dr. Timoteo Expose message.  Scheduled lab visit on 12/26/18 at 10:00 AM.

## 2018-12-06 NOTE — Telephone Encounter (Signed)
Name of Medication: Oxycodoene-APAP, Alprazolam, Diazepam Name of Pharmacy: [facility] Last [place] or Written Date and Quantity:       Oxycodone-APAP- 11/17/18, #90      Alprazolam- 11/17/18, #10      Diazepam- 11/17/18, #60 Last Office Visit and Type: 11/28/18, acute Next Office Visit and Type: 02/17/19, AWV Last Controlled Substance Agreement Date: 03/27/16 Last UDS: 12/28/17

## 2018-12-09 NOTE — Telephone Encounter (Signed)
Eprescribed.

## 2018-12-13 ENCOUNTER — Telehealth: Payer: Self-pay | Admitting: Family Medicine

## 2018-12-13 MED ORDER — FAMOTIDINE 20 MG PO TABS: 20.0000 mg | ORAL_TABLET | Freq: Every day | ORAL | 3 refills | Status: DC

## 2018-12-13 NOTE — Telephone Encounter (Signed)
Spoke with pt relaying Dr. Timoteo Expose message.  Pt verbalizes understanding and states she is having reflux now.  After eating she will burp and the food comes back up to the back of her throat.  Feels like her food does not go all the way to her stomach.  Like it stops mid chest between her breasts. Says the Nexium is not helping. Also, states she cannot take Prevacid or Pepcid because of her kidneys. Says she was told not to take them.

## 2018-12-13 NOTE — Telephone Encounter (Signed)
Best number [phone]  Pt called wanting to let you know she gerd and wants something for this.  She has had 2 bottles of probiotics and eating tums  [facility]  [address] in [employer] virtual appointment pt just wanted message sent back

## 2018-12-13 NOTE — Telephone Encounter (Signed)
Continue nexium - refilled.  Add pepcid 20mg  nightly I have sent to pharmacy.  As long as she hasn't had a reaction to pepcid in the past, no problem with taking pepcid - doesn't hurt kidneys - ok to take together with nexium.   Also: Elevate head of bed Avoid of citrus, fatty foods, chocolate, peppermint, and excessive alcohol, along with sodas, orange juice (acidic drinks) At least a few hours between dinner and bed, minimize naps after eating.

## 2018-12-13 NOTE — Telephone Encounter (Signed)
Dr. G please advise 

## 2018-12-13 NOTE — Telephone Encounter (Signed)
Is she having increasing heartburn? She should be on nexium 40mg  PRN - has she tried this and not effective? We can refill if needed. If not effecting with daily nexium, would have her add pepcid 20mg  daily at night.

## 2018-12-14 NOTE — Telephone Encounter (Signed)
Spoke with pt relaying Dr. G's instructions.  Pt verbalizes understanding.  

## 2018-12-15 ENCOUNTER — Other Ambulatory Visit: Payer: Self-pay | Admitting: Family Medicine

## 2018-12-15 NOTE — Telephone Encounter (Signed)
[facility] on [address]. She is requesting a cb.

## 2018-12-15 NOTE — Telephone Encounter (Signed)
Pt called and said she will not take pepcid due to kidney issues. She found an old med and is taking Omeprazoledr 20mg  capsule- she is taking 3 times a day before each meal. She is also taking nexium in the morning. She is requesting a prescription for Omeprazoledr 20mg , she is almost out- has about 10 pills left. She said this helping and working to reduce her symptoms of GERD.

## 2018-12-16 ENCOUNTER — Other Ambulatory Visit: Payer: Self-pay | Admitting: Family Medicine

## 2018-12-16 MED ORDER — FAMOTIDINE 40 MG PO TABS: 20.0000 mg | ORAL_TABLET | Freq: Every day | ORAL | 1 refills | Status: DC

## 2018-12-16 NOTE — Telephone Encounter (Signed)
ok 

## 2018-12-16 NOTE — Telephone Encounter (Signed)
I will not send in omeprazole as that is same family as nexium and she's over using it.  I recommend pepcid.  pepcid is safer than omeprazole for kidneys.

## 2018-12-16 NOTE — Addendum Note (Signed)
Addended by: [name] on: 12/16/2018 05:16 PM   Modules accepted: Orders

## 2018-12-16 NOTE — Telephone Encounter (Signed)
Spoke with pt relaying Dr. Timoteo Expose message.  Pt verbalizes understanding. Says she called the pharmacy and they did not have it.  I spoke with [facility] and was told they are out of famotidine 20 mg tab.  Suggests a new rx for 40 mg tab for pt to take 0.5 tab.

## 2018-12-24 ENCOUNTER — Other Ambulatory Visit: Payer: Self-pay | Admitting: Family Medicine

## 2018-12-24 DIAGNOSIS — G8929 Other chronic pain: Secondary | ICD-10-CM

## 2018-12-24 DIAGNOSIS — G894 Chronic pain syndrome: Secondary | ICD-10-CM

## 2018-12-26 ENCOUNTER — Other Ambulatory Visit: Payer: Medicare Other

## 2019-01-06 ENCOUNTER — Telehealth: Payer: Self-pay | Admitting: Family Medicine

## 2019-01-06 DIAGNOSIS — R509 Fever, unspecified: Secondary | ICD-10-CM | POA: Diagnosis not present

## 2019-01-06 DIAGNOSIS — I252 Old myocardial infarction: Secondary | ICD-10-CM | POA: Diagnosis not present

## 2019-01-06 DIAGNOSIS — I129 Hypertensive chronic kidney disease with stage 1 through stage 4 chronic kidney disease, or unspecified chronic kidney disease: Secondary | ICD-10-CM | POA: Diagnosis not present

## 2019-01-06 DIAGNOSIS — N189 Chronic kidney disease, unspecified: Secondary | ICD-10-CM | POA: Diagnosis not present

## 2019-01-06 DIAGNOSIS — Z885 Allergy status to narcotic agent status: Secondary | ICD-10-CM | POA: Diagnosis not present

## 2019-01-06 DIAGNOSIS — Z882 Allergy status to sulfonamides status: Secondary | ICD-10-CM | POA: Diagnosis not present

## 2019-01-06 DIAGNOSIS — Z87891 Personal history of nicotine dependence: Secondary | ICD-10-CM | POA: Diagnosis not present

## 2019-01-06 DIAGNOSIS — R5381 Other malaise: Secondary | ICD-10-CM | POA: Diagnosis not present

## 2019-01-06 DIAGNOSIS — J449 Chronic obstructive pulmonary disease, unspecified: Secondary | ICD-10-CM | POA: Diagnosis not present

## 2019-01-06 DIAGNOSIS — J019 Acute sinusitis, unspecified: Secondary | ICD-10-CM | POA: Diagnosis not present

## 2019-01-06 DIAGNOSIS — R05 Cough: Secondary | ICD-10-CM | POA: Diagnosis not present

## 2019-01-06 DIAGNOSIS — R0602 Shortness of breath: Secondary | ICD-10-CM | POA: Diagnosis not present

## 2019-01-06 NOTE — Telephone Encounter (Signed)
Noted. Thanks.

## 2019-01-06 NOTE — Telephone Encounter (Signed)
Spoke with pt asking about her sxs.  States she started this week with UTI sxs (low abd pain/pressure, burning with urination, possible fever), then started with a cough.  Says it feels like she has pneumonia in her right lung, which she says she usually has a "bad respiratory issue about this time every year".  Pt has been doing breathing tx and all inhalers without much help.  Instructed pt to seek urgent care, per Dr. Darnell Level.  Pt verbalizes understanding.  Says she will go to ER in [facility]. Expresses her thanks for calling back.  Fyi to Dr. Darnell Level.

## 2019-01-06 NOTE — Telephone Encounter (Signed)
Pt called wanting to get an appointment  She stated she thinks she has pneumonia in left lung.  I tried to transfer her to [facility] to be triaged.  I was on hold over 30 min  Trying to transfer pt.  Pt hung up.  Please call pt

## 2019-01-13 ENCOUNTER — Other Ambulatory Visit: Payer: Self-pay | Admitting: Family Medicine

## 2019-01-13 DIAGNOSIS — F319 Bipolar disorder, unspecified: Secondary | ICD-10-CM

## 2019-01-16 ENCOUNTER — Other Ambulatory Visit: Payer: Medicare Other

## 2019-01-16 DIAGNOSIS — G8929 Other chronic pain: Secondary | ICD-10-CM | POA: Diagnosis not present

## 2019-01-16 DIAGNOSIS — G894 Chronic pain syndrome: Secondary | ICD-10-CM | POA: Diagnosis not present

## 2019-01-17 NOTE — Telephone Encounter (Signed)
Pt left v/m that pt is out of med and request refills done today.

## 2019-01-17 NOTE — Telephone Encounter (Signed)
Eprescribed.

## 2019-01-17 NOTE — Telephone Encounter (Signed)
Name of Medication: Oxycodone-APAP, diazepam, alprazolam Name of Pharmacy: [facility] or Written Date and Quantity:     Oxycodone-APAP- 12/15/18, #90    Diazepam- 12/15/18, #60    Alprazolam- 12/15/18, #10 Last Office Visit and Type: 11/28/18, acute Next Office Visit and Type: 02/17/19, AWV Last Controlled Substance Agreement Date: 03/27/16 Last UDS: 12/28/17   Also:   Abilify- 12/15/18, #90 Acyclovir- 11/17/18, #60 Macrodantin- 11/17/18, #90 Etodolac- 11/17/18, #60 Colchicine- 08/19/18, #30 Linzess- 07/22/18, #30 Augmentin- 05/03/18, #20

## 2019-01-17 NOTE — Telephone Encounter (Signed)
Patient stated she is completely out of medication.

## 2019-01-19 LAB — PAIN MGMT, PROFILE 8 W/CONF, U
6 Acetylmorphine: NEGATIVE ng/mL
Alcohol Metabolites: NEGATIVE ng/mL (ref <500)
Alphahydroxyalprazolam: NEGATIVE ng/mL
Alphahydroxymidazolam: NEGATIVE ng/mL
Alphahydroxytriazolam: NEGATIVE ng/mL
Aminoclonazepam: NEGATIVE ng/mL
Amphetamines: NEGATIVE ng/mL
Benzodiazepines: POSITIVE ng/mL
Buprenorphine, Urine: NEGATIVE ng/mL
Cocaine Metabolite: NEGATIVE ng/mL
Creatinine: 24.4 mg/dL
Hydroxyethylflurazepam: NEGATIVE ng/mL
Lorazepam: NEGATIVE ng/mL
MDMA: NEGATIVE ng/mL
Marijuana Metabolite: NEGATIVE ng/mL
Nordiazepam: 310 ng/mL
Opiates: NEGATIVE ng/mL
Oxazepam: 505 ng/mL
Oxidant: NEGATIVE ug/mL
Oxycodone: NEGATIVE ng/mL
Temazepam: 513 ng/mL
pH: 5 (ref 4.5–9.0)

## 2019-01-24 ENCOUNTER — Other Ambulatory Visit: Payer: Self-pay | Admitting: Family Medicine

## 2019-02-15 ENCOUNTER — Other Ambulatory Visit: Payer: Self-pay | Admitting: Family Medicine

## 2019-02-15 NOTE — Telephone Encounter (Signed)
[facility] Last filled:  08/19/18, #30 Last OV:  11/28/18, acute Next OV:  02/17/19, AWV

## 2019-02-17 ENCOUNTER — Other Ambulatory Visit: Payer: Self-pay

## 2019-02-17 ENCOUNTER — Ambulatory Visit (INDEPENDENT_AMBULATORY_CARE_PROVIDER_SITE_OTHER): Payer: Medicare Other | Admitting: Family Medicine

## 2019-02-17 ENCOUNTER — Encounter: Payer: Self-pay | Admitting: Family Medicine

## 2019-02-17 VITALS — BP 134/68 | HR 86 | Temp 97.6°F | Ht 65.0 in | Wt 228.5 lb

## 2019-02-17 DIAGNOSIS — Z0000 Encounter for general adult medical examination without abnormal findings: Secondary | ICD-10-CM

## 2019-02-17 DIAGNOSIS — F319 Bipolar disorder, unspecified: Secondary | ICD-10-CM | POA: Diagnosis not present

## 2019-02-17 DIAGNOSIS — G894 Chronic pain syndrome: Secondary | ICD-10-CM | POA: Diagnosis not present

## 2019-02-17 DIAGNOSIS — K219 Gastro-esophageal reflux disease without esophagitis: Secondary | ICD-10-CM

## 2019-02-17 DIAGNOSIS — Z0001 Encounter for general adult medical examination with abnormal findings: Secondary | ICD-10-CM | POA: Diagnosis not present

## 2019-02-17 DIAGNOSIS — K581 Irritable bowel syndrome with constipation: Secondary | ICD-10-CM

## 2019-02-17 DIAGNOSIS — E785 Hyperlipidemia, unspecified: Secondary | ICD-10-CM | POA: Diagnosis not present

## 2019-02-17 DIAGNOSIS — R059 Cough, unspecified: Secondary | ICD-10-CM

## 2019-02-17 DIAGNOSIS — N3020 Other chronic cystitis without hematuria: Secondary | ICD-10-CM

## 2019-02-17 DIAGNOSIS — Z6835 Body mass index (BMI) 35.0-35.9, adult: Secondary | ICD-10-CM

## 2019-02-17 DIAGNOSIS — J329 Chronic sinusitis, unspecified: Secondary | ICD-10-CM

## 2019-02-17 DIAGNOSIS — Z87891 Personal history of nicotine dependence: Secondary | ICD-10-CM

## 2019-02-17 DIAGNOSIS — F411 Generalized anxiety disorder: Secondary | ICD-10-CM

## 2019-02-17 DIAGNOSIS — N952 Postmenopausal atrophic vaginitis: Secondary | ICD-10-CM

## 2019-02-17 DIAGNOSIS — R05 Cough: Secondary | ICD-10-CM

## 2019-02-17 DIAGNOSIS — G8929 Other chronic pain: Secondary | ICD-10-CM

## 2019-02-17 DIAGNOSIS — J40 Bronchitis, not specified as acute or chronic: Secondary | ICD-10-CM

## 2019-02-17 DIAGNOSIS — R892 Abnormal level of other drugs, medicaments and biological substances in specimens from other organs, systems and tissues: Secondary | ICD-10-CM

## 2019-02-17 LAB — COMPREHENSIVE METABOLIC PANEL
ALT: 48 U/L — ABNORMAL HIGH (ref 0–35)
AST: 40 U/L — ABNORMAL HIGH (ref 0–37)
Albumin: 4.4 g/dL (ref 3.5–5.2)
Alkaline Phosphatase: 81 U/L (ref 39–117)
BUN: 14 mg/dL (ref 6–23)
CO2: 31 mEq/L (ref 19–32)
Calcium: 9.8 mg/dL (ref 8.4–10.5)
Chloride: 100 mEq/L (ref 96–112)
Creatinine, Ser: 0.92 mg/dL (ref 0.40–1.20)
GFR: 62.42 mL/min (ref >60.00)
Glucose, Bld: 140 mg/dL — ABNORMAL HIGH (ref 70–99)
Potassium: 4.2 mEq/L (ref 3.5–5.1)
Sodium: 142 mEq/L (ref 135–145)
Total Bilirubin: 0.5 mg/dL (ref 0.2–1.2)
Total Protein: 6.5 g/dL (ref 6.0–8.3)

## 2019-02-17 LAB — TSH: TSH: 0.86 u[IU]/mL (ref 0.35–4.50)

## 2019-02-17 LAB — LIPID PANEL
Cholesterol: 208 mg/dL — ABNORMAL HIGH (ref 0–200)
HDL: 53.4 mg/dL (ref >39.00)
Total CHOL/HDL Ratio: 4
Triglycerides: 430 mg/dL — ABNORMAL HIGH (ref 0.0–149.0)

## 2019-02-17 LAB — LDL CHOLESTEROL, DIRECT: Direct LDL: 109 mg/dL

## 2019-02-17 MED ORDER — CITALOPRAM HYDROBROMIDE 10 MG PO TABS: 10.0000 mg | ORAL_TABLET | Freq: Every day | ORAL | 3 refills | Status: DC

## 2019-02-17 MED ORDER — LINACLOTIDE 290 MCG PO CAPS: ORAL_CAPSULE | ORAL | 11 refills | Status: DC

## 2019-02-17 MED ORDER — ARIPIPRAZOLE 2 MG PO TABS: 2.0000 mg | ORAL_TABLET | Freq: Every day | ORAL | 3 refills | Status: DC

## 2019-02-17 MED ORDER — COLCHICINE 0.6 MG PO TABS: 0.6000 mg | ORAL_TABLET | Freq: Every day | ORAL | 0 refills | Status: DC | PRN

## 2019-02-17 MED ORDER — SPIRIVA HANDIHALER 18 MCG IN CAPS: 18.0000 ug | ORAL_CAPSULE | Freq: Every day | RESPIRATORY_TRACT | 3 refills | Status: DC

## 2019-02-17 MED ORDER — ESOMEPRAZOLE MAGNESIUM 40 MG PO CPDR: 40.0000 mg | DELAYED_RELEASE_CAPSULE | Freq: Every day | ORAL | 3 refills | Status: DC

## 2019-02-17 MED ORDER — ESTRADIOL 0.1 MG/GM VA CREA: TOPICAL_CREAM | VAGINAL | 12 refills | Status: DC

## 2019-02-17 MED ORDER — FUROSEMIDE 20 MG PO TABS: 20.0000 mg | ORAL_TABLET | Freq: Every day | ORAL | 1 refills | Status: DC | PRN

## 2019-02-17 MED ORDER — NITROFURANTOIN MACROCRYSTAL 100 MG PO CAPS: 100.0000 mg | ORAL_CAPSULE | Freq: Every day | ORAL | 1 refills | Status: DC

## 2019-02-17 MED ORDER — DICYCLOMINE HCL 10 MG PO CAPS: ORAL_CAPSULE | ORAL | 3 refills | Status: DC

## 2019-02-17 MED ORDER — ALPRAZOLAM 1 MG PO TABS: ORAL_TABLET | ORAL | 0 refills | Status: DC

## 2019-02-17 MED ORDER — ALBUTEROL SULFATE HFA 108 (90 BASE) MCG/ACT IN AERS: INHALATION_SPRAY | RESPIRATORY_TRACT | 3 refills | Status: DC

## 2019-02-17 MED ORDER — OXYCODONE-ACETAMINOPHEN 7.5-325 MG PO TABS: 1.0000 | ORAL_TABLET | Freq: Three times a day (TID) | ORAL | 0 refills | Status: DC | PRN

## 2019-02-17 MED ORDER — ACYCLOVIR 400 MG PO TABS: 400.0000 mg | ORAL_TABLET | Freq: Two times a day (BID) | ORAL | 6 refills | Status: DC

## 2019-02-17 MED ORDER — ATORVASTATIN CALCIUM 10 MG PO TABS: 10.0000 mg | ORAL_TABLET | Freq: Every day | ORAL | 3 refills | Status: DC

## 2019-02-17 MED ORDER — FLUTICASONE-SALMETEROL 250-50 MCG/DOSE IN AEPB: INHALATION_SPRAY | RESPIRATORY_TRACT | 11 refills | Status: DC

## 2019-02-17 MED ORDER — ALLOPURINOL 100 MG PO TABS: 100.0000 mg | ORAL_TABLET | Freq: Every day | ORAL | 3 refills | Status: DC

## 2019-02-17 MED ORDER — ETODOLAC 400 MG PO TABS: 400.0000 mg | ORAL_TABLET | Freq: Two times a day (BID) | ORAL | 3 refills | Status: DC | PRN

## 2019-02-17 MED ORDER — POTASSIUM CHLORIDE ER 10 MEQ PO TBCR: 10.0000 meq | EXTENDED_RELEASE_TABLET | Freq: Every day | ORAL | 1 refills | Status: DC | PRN

## 2019-02-17 MED ORDER — FLUTICASONE PROPIONATE 50 MCG/ACT NA SUSP: 2.0000 | Freq: Every day | NASAL | 11 refills | Status: DC

## 2019-02-17 MED ORDER — TIZANIDINE HCL 4 MG PO TABS: 4.0000 mg | ORAL_TABLET | Freq: Two times a day (BID) | ORAL | 3 refills | Status: DC | PRN

## 2019-02-17 MED ORDER — DIAZEPAM 10 MG PO TABS: 10.0000 mg | ORAL_TABLET | Freq: Two times a day (BID) | ORAL | 0 refills | Status: DC

## 2019-02-17 NOTE — Assessment & Plan Note (Signed)
Preventative protocols reviewed and updated unless pt declined. Discussed healthy diet and lifestyle.  

## 2019-02-17 NOTE — Assessment & Plan Note (Addendum)
[place] CSRS reviewed.  Reviewed latest UDS - update today.

## 2019-02-17 NOTE — Assessment & Plan Note (Signed)
Will refer for lung cancer screening CT

## 2019-02-17 NOTE — Assessment & Plan Note (Signed)
Continue nitrofurantoin for ppx. Pt states she has recurrent UTIs if not taken daily.

## 2019-02-17 NOTE — Assessment & Plan Note (Signed)
Acutely worse in setting of pandemic and children's father's accidental death. Will add celexa. She will let me know if decides to pursue counseling.

## 2019-02-17 NOTE — Assessment & Plan Note (Signed)
Reviewed weight gain noted. Encouraged healthy diet and lifestyle changes to affect sustainable weight loss.  

## 2019-02-17 NOTE — Assessment & Plan Note (Signed)

## 2019-02-17 NOTE — Assessment & Plan Note (Signed)
Chronic, on lipitor and lovaza. Update FLP. The 10-year ASCVD risk score Mikey Bussing DC Brooke Bonito., et al., 2013) is: 3.2%   Values used to calculate the score:     Age: 58 years     Sex: Female     Is Non-Hispanic African American: No     Diabetic: No     Tobacco smoker: No     Systolic Blood Pressure: 347 mmHg     Is BP treated: No     HDL Cholesterol: 67.6 mg/dL     Total Cholesterol: 234 mg/dL

## 2019-02-17 NOTE — Assessment & Plan Note (Addendum)
Continue abilify 2mg  daily. Will add celexa 10mg  daily for worsening depression. Declines return to psych, aware may need to pending results of med changes

## 2019-02-17 NOTE — Patient Instructions (Addendum)
Repeat UDS today. Labs today Call [facility] to schedule mammogram as you're due.  We will refer you to lung cancer screening CT.  Return as needed or in 3 months for chronic pain visit.   Health Maintenance, Female Adopting a healthy lifestyle and getting preventive care are important in promoting health and wellness. Ask your health care provider about:  The right schedule for you to have regular tests and exams.  Things you can do on your own to prevent diseases and keep yourself healthy. What should I know about diet, weight, and exercise? Eat a healthy diet   Eat a diet that includes plenty of vegetables, fruits, low-fat dairy products, and lean protein.  Do not eat a lot of foods that are high in solid fats, added sugars, or sodium. Maintain a healthy weight Body mass index (BMI) is used to identify weight problems. It estimates body fat based on height and weight. Your health care provider can help determine your BMI and help you achieve or maintain a healthy weight. Get regular exercise Get regular exercise. This is one of the most important things you can do for your health. Most adults should:  Exercise for at least 150 minutes each week. The exercise should increase your heart rate and make you sweat (moderate-intensity exercise).  Do strengthening exercises at least twice a week. This is in addition to the moderate-intensity exercise.  Spend less time sitting. Even light physical activity can be beneficial. Watch cholesterol and blood lipids Have your blood tested for lipids and cholesterol at 59 years of age, then have this test every 5 years. Have your cholesterol levels checked more often if:  Your lipid or cholesterol levels are high.  You are older than 59 years of age.  You are at high risk for heart disease. What should I know about cancer screening? Depending on your health history and family history, you may need to have cancer screening at various ages. This  may include screening for:  Breast cancer.  Cervical cancer.  Colorectal cancer.  Skin cancer.  Lung cancer. What should I know about heart disease, diabetes, and high blood pressure? Blood pressure and heart disease  High blood pressure causes heart disease and increases the risk of stroke. This is more likely to develop in people who have high blood pressure readings, are of African descent, or are overweight.  Have your blood pressure checked: ? Every 3-5 years if you are 73-30 years of age. ? Every year if you are 69 years old or older. Diabetes Have regular diabetes screenings. This checks your fasting blood sugar level. Have the screening done:  Once every three years after age 79 if you are at a normal weight and have a low risk for diabetes.  More often and at a younger age if you are overweight or have a high risk for diabetes. What should I know about preventing infection? Hepatitis B If you have a higher risk for hepatitis B, you should be screened for this virus. Talk with your health care provider to find out if you are at risk for hepatitis B infection. Hepatitis C Testing is recommended for:  Everyone born from [birth year] through 1966.  Anyone with known risk factors for hepatitis C. Sexually transmitted infections (STIs)  Get screened for STIs, including gonorrhea and chlamydia, if: ? You are sexually active and are younger than 59 years of age. ? You are older than 59 years of age and your health care provider tells you  that you are at risk for this type of infection. ? Your sexual activity has changed since you were last screened, and you are at increased risk for chlamydia or gonorrhea. Ask your health care provider if you are at risk.  Ask your health care provider about whether you are at high risk for HIV. Your health care provider may recommend a prescription medicine to help prevent HIV infection. If you choose to take medicine to prevent HIV, you should  first get tested for HIV. You should then be tested every 3 months for as long as you are taking the medicine. Pregnancy  If you are about to stop having your period (premenopausal) and you may become pregnant, seek counseling before you get pregnant.  Take 400 to 800 micrograms (mcg) of folic acid every day if you become pregnant.  Ask for birth control (contraception) if you want to prevent pregnancy. Osteoporosis and menopause Osteoporosis is a disease in which the bones lose minerals and strength with aging. This can result in bone fractures. If you are 21 years old or older, or if you are at risk for osteoporosis and fractures, ask your health care provider if you should:  Be screened for bone loss.  Take a calcium or vitamin D supplement to lower your risk of fractures.  Be given hormone replacement therapy (HRT) to treat symptoms of menopause. Follow these instructions at home: Lifestyle  Do not use any products that contain nicotine or tobacco, such as cigarettes, e-cigarettes, and chewing tobacco. If you need help quitting, ask your health care provider.  Do not use street drugs.  Do not share needles.  Ask your health care provider for help if you need support or information about quitting drugs. Alcohol use  Do not drink alcohol if: ? Your health care provider tells you not to drink. ? You are pregnant, may be pregnant, or are planning to become pregnant.  If you drink alcohol: ? Limit how much you use to 0-1 drink a day. ? Limit intake if you are breastfeeding.  Be aware of how much alcohol is in your drink. In the [place] one drink equals one 12 oz bottle of beer (355 mL), one 5 oz glass of wine (148 mL), or one 1 oz glass of hard liquor (44 mL). General instructions  Schedule regular health, dental, and eye exams.  Stay current with your vaccines.  Tell your health care provider if: ? You often feel depressed. ? You have ever been abused or do not feel safe  at home. Summary  Adopting a healthy lifestyle and getting preventive care are important in promoting health and wellness.  Follow your health care provider's instructions about healthy diet, exercising, and getting tested or screened for diseases.  Follow your health care provider's instructions on monitoring your cholesterol and blood pressure. This information is not intended to replace advice given to you by your health care provider. Make sure you discuss any questions you have with your health care provider. Document Released: 01/12/2011 Document Revised: 06/22/2018 Document Reviewed: 06/22/2018 Elsevier Patient Education  2020 [employer].

## 2019-02-17 NOTE — Assessment & Plan Note (Signed)
OA. On chronic narcotic therapy. Sees [facility] for interventional therapies.
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
## 2019-02-17 NOTE — Assessment & Plan Note (Signed)
Continue linzess and bentyl.

## 2019-02-17 NOTE — Assessment & Plan Note (Signed)
Continue esomeprazole daily.

## 2019-02-17 NOTE — Addendum Note (Signed)
Addended by: Brenton Grills on: 12/18/6718 94:70 AM   Modules accepted: Orders

## 2019-02-17 NOTE — Assessment & Plan Note (Signed)
Reviewed with patient. Rpt UDS today.

## 2019-02-19 ENCOUNTER — Other Ambulatory Visit: Payer: Self-pay | Admitting: Family Medicine

## 2019-02-19 DIAGNOSIS — R739 Hyperglycemia, unspecified: Secondary | ICD-10-CM

## 2019-02-19 DIAGNOSIS — R7401 Elevation of levels of liver transaminase levels: Secondary | ICD-10-CM

## 2019-02-19 DIAGNOSIS — K760 Fatty (change of) liver, not elsewhere classified: Secondary | ICD-10-CM | POA: Insufficient documentation

## 2019-02-19 DIAGNOSIS — E785 Hyperlipidemia, unspecified: Secondary | ICD-10-CM

## 2019-02-19 LAB — PAIN MGMT, PROFILE 8 W/CONF, U
6 Acetylmorphine: NEGATIVE ng/mL
Alcohol Metabolites: NEGATIVE ng/mL (ref <500)
Alphahydroxyalprazolam: NEGATIVE ng/mL
Alphahydroxymidazolam: NEGATIVE ng/mL
Alphahydroxytriazolam: NEGATIVE ng/mL
Aminoclonazepam: NEGATIVE ng/mL
Amphetamines: NEGATIVE ng/mL
Benzodiazepines: POSITIVE ng/mL
Buprenorphine, Urine: NEGATIVE ng/mL
Cocaine Metabolite: NEGATIVE ng/mL
Codeine: NEGATIVE ng/mL
Creatinine: 23 mg/dL
Hydrocodone: NEGATIVE ng/mL
Hydromorphone: NEGATIVE ng/mL
Hydroxyethylflurazepam: NEGATIVE ng/mL
Lorazepam: NEGATIVE ng/mL
MDMA: NEGATIVE ng/mL
Marijuana Metabolite: NEGATIVE ng/mL
Morphine: NEGATIVE ng/mL
Nordiazepam: 156 ng/mL
Norhydrocodone: NEGATIVE ng/mL
Noroxycodone: 216 ng/mL
Opiates: NEGATIVE ng/mL
Oxazepam: 242 ng/mL
Oxidant: NEGATIVE ug/mL
Oxycodone: 474 ng/mL
Oxycodone: POSITIVE ng/mL
Oxymorphone: 240 ng/mL
Temazepam: 386 ng/mL
pH: 6 (ref 4.5–9.0)

## 2019-02-20 ENCOUNTER — Telehealth: Payer: Self-pay | Admitting: *Deleted

## 2019-02-20 NOTE — Telephone Encounter (Signed)
Received referral for lung screening scan, patient contacted and requests a call back to schedule in September.

## 2019-03-06 ENCOUNTER — Other Ambulatory Visit: Payer: Self-pay | Admitting: Family Medicine

## 2019-03-06 ENCOUNTER — Telehealth: Payer: Self-pay | Admitting: *Deleted

## 2019-03-06 MED ORDER — METHOCARBAMOL 750 MG PO TABS: 750.0000 mg | ORAL_TABLET | Freq: Two times a day (BID) | ORAL | 3 refills | Status: DC | PRN

## 2019-03-06 NOTE — Telephone Encounter (Signed)
Patient left a voicemail stating that she wants her Zanaflex changed back to Robaxin,. Patient stated that the Zanaflex is making her too sleepy to drive. [place]

## 2019-03-06 NOTE — Telephone Encounter (Signed)
Sent in. This may not be covered as well by insurance - will see.

## 2019-03-07 NOTE — Telephone Encounter (Signed)
Spoke with pt relaying Dr. Synthia Innocent message.  Verbalizes understanding, apologizes for going back and forth and expresses her thanks.

## 2019-03-08 ENCOUNTER — Other Ambulatory Visit: Payer: Self-pay | Admitting: Family Medicine

## 2019-03-08 DIAGNOSIS — Z1231 Encounter for screening mammogram for malignant neoplasm of breast: Secondary | ICD-10-CM

## 2019-03-09 ENCOUNTER — Ambulatory Visit
Admission: RE | Admit: 2019-03-09 | Discharge: 2019-03-09 | Disposition: A | Discharge status: Home / Self Care | Payer: Medicare Other | Source: Ambulatory Visit | Attending: Family Medicine | Admitting: Family Medicine

## 2019-03-09 ENCOUNTER — Encounter: Payer: Self-pay | Admitting: Family Medicine

## 2019-03-09 DIAGNOSIS — Z1231 Encounter for screening mammogram for malignant neoplasm of breast: Secondary | ICD-10-CM | POA: Diagnosis not present

## 2019-03-09 LAB — HM MAMMOGRAPHY

## 2019-03-10 ENCOUNTER — Other Ambulatory Visit: Payer: Self-pay | Admitting: Family Medicine

## 2019-03-10 NOTE — Telephone Encounter (Signed)
Name of Medication: Alprazolam, Diazepam, Oxycodone-APAP Name of Pharmacy: [facility] or Written Date and Quantity:       Alprazolam- 02/17/19< #10      Diazepam- 02/17/19, #60      Oxycodone-APAP- 02/17/19 Last Office Visit and Type: 02/17/19, AWV Next Office Visit and Type: 05/19/19, 3 mo chronic pain f/u Last Controlled Substance Agreement Date: 02/17/20 Last UDS: 02/17/20

## 2019-03-14 ENCOUNTER — Telehealth: Payer: Self-pay | Admitting: *Deleted

## 2019-03-14 DIAGNOSIS — Z87891 Personal history of nicotine dependence: Secondary | ICD-10-CM

## 2019-03-14 DIAGNOSIS — Z122 Encounter for screening for malignant neoplasm of respiratory organs: Secondary | ICD-10-CM

## 2019-03-14 NOTE — Telephone Encounter (Signed)
Eprescribed.

## 2019-03-14 NOTE — Telephone Encounter (Signed)
Received referral for initial lung cancer screening scan. Contacted patient and obtained smoking history,(former, quit 07/13/08, 30 pack year) as well as answering questions related to screening process. Patient denies signs of lung cancer such as weight loss or hemoptysis. Patient denies comorbidity that would prevent curative treatment if lung cancer were found. Patient is scheduled for shared decision making visit and CT scan on 03/27/19 at 1030am.

## 2019-03-22 ENCOUNTER — Telehealth: Payer: Self-pay

## 2019-03-22 DIAGNOSIS — I252 Old myocardial infarction: Secondary | ICD-10-CM | POA: Diagnosis not present

## 2019-03-22 DIAGNOSIS — I129 Hypertensive chronic kidney disease with stage 1 through stage 4 chronic kidney disease, or unspecified chronic kidney disease: Secondary | ICD-10-CM | POA: Diagnosis not present

## 2019-03-22 DIAGNOSIS — R109 Unspecified abdominal pain: Secondary | ICD-10-CM | POA: Diagnosis not present

## 2019-03-22 DIAGNOSIS — N189 Chronic kidney disease, unspecified: Secondary | ICD-10-CM | POA: Diagnosis not present

## 2019-03-22 DIAGNOSIS — Z87891 Personal history of nicotine dependence: Secondary | ICD-10-CM | POA: Diagnosis not present

## 2019-03-22 DIAGNOSIS — M47816 Spondylosis without myelopathy or radiculopathy, lumbar region: Secondary | ICD-10-CM | POA: Diagnosis not present

## 2019-03-22 DIAGNOSIS — J811 Chronic pulmonary edema: Secondary | ICD-10-CM | POA: Diagnosis not present

## 2019-03-22 DIAGNOSIS — G894 Chronic pain syndrome: Secondary | ICD-10-CM | POA: Diagnosis not present

## 2019-03-22 DIAGNOSIS — M545 Low back pain: Secondary | ICD-10-CM | POA: Diagnosis not present

## 2019-03-22 DIAGNOSIS — J449 Chronic obstructive pulmonary disease, unspecified: Secondary | ICD-10-CM | POA: Diagnosis not present

## 2019-03-22 DIAGNOSIS — R079 Chest pain, unspecified: Secondary | ICD-10-CM | POA: Diagnosis not present

## 2019-03-22 DIAGNOSIS — R600 Localized edema: Secondary | ICD-10-CM | POA: Diagnosis not present

## 2019-03-22 DIAGNOSIS — I4891 Unspecified atrial fibrillation: Secondary | ICD-10-CM | POA: Diagnosis not present

## 2019-03-22 NOTE — Telephone Encounter (Signed)
Pt said her whole body is swelling; knees and ankles are so swollen they are bruising. Back doctor at [facility] advised pt could be blood clot or heart issue. No CP but pt has SOB and pt states "she feels bad." pt also having a lot of cramping all over body.pt will go to [facility] ED in [facility] and will call update to Dr Danise Mina. FYI to DR Danise Mina.

## 2019-03-22 NOTE — Telephone Encounter (Signed)
Noted.  Will await update from patient.  

## 2019-03-24 ENCOUNTER — Other Ambulatory Visit (INDEPENDENT_AMBULATORY_CARE_PROVIDER_SITE_OTHER): Payer: Medicare Other

## 2019-03-24 ENCOUNTER — Telehealth: Payer: Self-pay | Admitting: Family Medicine

## 2019-03-24 DIAGNOSIS — F319 Bipolar disorder, unspecified: Secondary | ICD-10-CM

## 2019-03-24 DIAGNOSIS — R740 Nonspecific elevation of levels of transaminase and lactic acid dehydrogenase [LDH]: Secondary | ICD-10-CM | POA: Diagnosis not present

## 2019-03-24 DIAGNOSIS — R739 Hyperglycemia, unspecified: Secondary | ICD-10-CM

## 2019-03-24 DIAGNOSIS — R7401 Elevation of levels of liver transaminase levels: Secondary | ICD-10-CM

## 2019-03-24 DIAGNOSIS — E785 Hyperlipidemia, unspecified: Secondary | ICD-10-CM

## 2019-03-24 DIAGNOSIS — F411 Generalized anxiety disorder: Secondary | ICD-10-CM

## 2019-03-24 LAB — COMPREHENSIVE METABOLIC PANEL
ALT: 43 U/L — ABNORMAL HIGH (ref 0–35)
AST: 42 U/L — ABNORMAL HIGH (ref 0–37)
Albumin: 4.2 g/dL (ref 3.5–5.2)
Alkaline Phosphatase: 73 U/L (ref 39–117)
BUN: 16 mg/dL (ref 6–23)
CO2: 29 mEq/L (ref 19–32)
Calcium: 8.9 mg/dL (ref 8.4–10.5)
Chloride: 102 mEq/L (ref 96–112)
Creatinine, Ser: 0.89 mg/dL (ref 0.40–1.20)
GFR: 64.83 mL/min (ref >60.00)
Glucose, Bld: 91 mg/dL (ref 70–99)
Potassium: 3.5 mEq/L (ref 3.5–5.1)
Sodium: 141 mEq/L (ref 135–145)
Total Bilirubin: 0.6 mg/dL (ref 0.2–1.2)
Total Protein: 6.4 g/dL (ref 6.0–8.3)

## 2019-03-24 LAB — LIPID PANEL
Cholesterol: 185 mg/dL (ref 0–200)
HDL: 63.9 mg/dL (ref >39.00)
NonHDL: 121.55
Total CHOL/HDL Ratio: 3
Triglycerides: 224 mg/dL — ABNORMAL HIGH (ref 0.0–149.0)
VLDL: 44.8 mg/dL — ABNORMAL HIGH (ref 0.0–40.0)

## 2019-03-24 LAB — LDL CHOLESTEROL, DIRECT: Direct LDL: 103 mg/dL

## 2019-03-24 LAB — HEMOGLOBIN A1C: Hgb A1c MFr Bld: 5.7 % (ref 4.6–6.5)

## 2019-03-24 LAB — FERRITIN: Ferritin: 62.3 ng/mL (ref 10.0–291.0)

## 2019-03-24 NOTE — Telephone Encounter (Signed)
Psych Referral faxed to [facility] and patient is aware.

## 2019-03-24 NOTE — Telephone Encounter (Signed)
Referral placed.

## 2019-03-24 NOTE — Telephone Encounter (Signed)
Patient was here in person and requested a Psychiatry Referral to [facility]. They would like a formal Referral faxed with office note in order to schedule her. Please place Referral.

## 2019-03-27 ENCOUNTER — Ambulatory Visit
Admission: RE | Admit: 2019-03-27 | Discharge: 2019-03-27 | Disposition: A | Discharge status: Home / Self Care | Payer: Medicare Other | Source: Ambulatory Visit | Attending: Nurse Practitioner | Admitting: Nurse Practitioner

## 2019-03-27 ENCOUNTER — Encounter: Payer: Self-pay | Admitting: Nurse Practitioner

## 2019-03-27 ENCOUNTER — Inpatient Hospital Stay: Payer: Medicare Other | Attending: Nurse Practitioner | Admitting: Nurse Practitioner

## 2019-03-27 ENCOUNTER — Other Ambulatory Visit: Payer: Self-pay

## 2019-03-27 DIAGNOSIS — Z87891 Personal history of nicotine dependence: Secondary | ICD-10-CM | POA: Insufficient documentation

## 2019-03-27 DIAGNOSIS — Z122 Encounter for screening for malignant neoplasm of respiratory organs: Secondary | ICD-10-CM | POA: Insufficient documentation

## 2019-03-27 LAB — HEPATITIS PANEL, ACUTE
Hep A IgM: NONREACTIVE
Hep B C IgM: NONREACTIVE
Hepatitis B Surface Ag: NONREACTIVE
Hepatitis C Ab: NONREACTIVE
SIGNAL TO CUT-OFF: 0 (ref <1.00)

## 2019-03-27 NOTE — Progress Notes (Signed)
Virtual Visit via Video Enabled Telemedicine Note   I connected with Kaylee [name] on 03/27/19 at 10:45 AM EST by video enabled telemedicine visit and verified that I am speaking with the correct person using two identifiers.   I discussed the limitations, risks, security and privacy concerns of performing an evaluation and management service by telemedicine and the availability of in-person appointments. I also discussed with the patient that there may be a patient responsible charge related to this service. The patient expressed understanding and agreed to proceed.   Other persons participating in the visit and their role in the encounter: Burgess Estelle, RN- checking in patient & navigation  Patient's location: clinic  Provider's location: home  Chief Complaint: Low Dose CT Screening  Patient agreed to evaluation by telemedicine to discuss shared decision making for consideration of low dose CT lung cancer screening.    In accordance with CMS guidelines, patient has met eligibility criteria including age, absence of signs or symptoms of lung cancer.  Social History   Tobacco Use  . Smoking status: Former Smoker    Packs/day: 1.00    Years: 30.00    Pack years: 30.00    Quit date: 07/13/2008    Years since quitting: 10.7  . Smokeless tobacco: Never Used  Substance Use Topics  . Alcohol use: Yes    Alcohol/week: 7.0 standard drinks    Types: 7 Cans of beer per week    Comment: occassionally     A shared decision-making session was conducted prior to the performance of CT scan. This includes one or more decision aids, includes benefits and harms of screening, follow-up diagnostic testing, over-diagnosis, false positive rate, and total radiation exposure.   Counseling on the importance of adherence to annual lung cancer LDCT screening, impact of co-morbidities, and ability or willingness to undergo diagnosis and treatment is imperative for compliance of the program.    Counseling on the importance of continued smoking cessation for former smokers; the importance of smoking cessation for current smokers, and information about tobacco cessation interventions have been given to patient including [place] and [address] [place] programs.   Written order for lung cancer screening with LDCT has been given to the patient and any and all questions have been answered to the best of my abilities.    Yearly follow up will be coordinated by Burgess Estelle, Thoracic Navigator.  I discussed the assessment and treatment plan with the patient. The patient was provided an opportunity to ask questions and all were answered. The patient agreed with the plan and demonstrated an understanding of the instructions.   The patient was advised to call back or seek an in-person evaluation if the symptoms worsen or if the condition fails to improve as anticipated.   I provided 15 minutes of face-to-face video visit time during this encounter, and > 50% was spent counseling as documented under my assessment & plan.   Beckey Rutter, DNP, AGNP-C [place] at [facility] [phone] (work cell) [phone] (office)

## 2019-03-29 ENCOUNTER — Other Ambulatory Visit: Payer: Self-pay | Admitting: Family Medicine

## 2019-03-29 DIAGNOSIS — R7401 Elevation of levels of liver transaminase levels: Secondary | ICD-10-CM

## 2019-03-29 NOTE — Addendum Note (Signed)
Addended by: [name] on: 03/29/2019 09:40 AM   Modules accepted: Orders

## 2019-03-30 DIAGNOSIS — M47816 Spondylosis without myelopathy or radiculopathy, lumbar region: Secondary | ICD-10-CM | POA: Diagnosis not present

## 2019-03-31 ENCOUNTER — Encounter: Payer: Self-pay | Admitting: *Deleted

## 2019-04-05 ENCOUNTER — Ambulatory Visit: Payer: Medicare Other | Admitting: Family Medicine

## 2019-04-07 ENCOUNTER — Other Ambulatory Visit: Payer: Self-pay | Admitting: Family Medicine

## 2019-04-07 NOTE — Telephone Encounter (Signed)
Name of Medication: Alprazolam, Diazepam, Oxycodone-APAP Name of Pharmacy: [place] or Written Date and Quantity:       Alprazolam- 03/14/19< #10      Diazepam- 03/14/19, #60      Oxycodone-APAP- 03/14/19-#90 Last Office Visit and Type: 02/17/19, AWV Next Office Visit and Type: 05/19/19, 3 mo chronic pain f/u Last Controlled Substance Agreement Date: 02/17/19 Last UDS: 02/17/19

## 2019-04-10 ENCOUNTER — Other Ambulatory Visit: Payer: Self-pay | Admitting: Family Medicine

## 2019-04-10 NOTE — Telephone Encounter (Signed)
ERx 

## 2019-04-10 NOTE — Telephone Encounter (Signed)
Electronic refill request [facility] Last office visit 02/17/19 Last refill 02/17/19 #30

## 2019-04-11 DIAGNOSIS — J449 Chronic obstructive pulmonary disease, unspecified: Secondary | ICD-10-CM | POA: Diagnosis not present

## 2019-04-11 DIAGNOSIS — Z0000 Encounter for general adult medical examination without abnormal findings: Secondary | ICD-10-CM | POA: Diagnosis not present

## 2019-04-11 DIAGNOSIS — Z0100 Encounter for examination of eyes and vision without abnormal findings: Secondary | ICD-10-CM | POA: Diagnosis not present

## 2019-04-11 DIAGNOSIS — M069 Rheumatoid arthritis, unspecified: Secondary | ICD-10-CM | POA: Diagnosis not present

## 2019-04-11 DIAGNOSIS — Z1389 Encounter for screening for other disorder: Secondary | ICD-10-CM | POA: Diagnosis not present

## 2019-04-11 DIAGNOSIS — M1990 Unspecified osteoarthritis, unspecified site: Secondary | ICD-10-CM | POA: Diagnosis not present

## 2019-04-11 DIAGNOSIS — I2510 Atherosclerotic heart disease of native coronary artery without angina pectoris: Secondary | ICD-10-CM | POA: Diagnosis not present

## 2019-04-11 DIAGNOSIS — E7800 Pure hypercholesterolemia, unspecified: Secondary | ICD-10-CM | POA: Diagnosis not present

## 2019-04-11 DIAGNOSIS — M109 Gout, unspecified: Secondary | ICD-10-CM | POA: Diagnosis not present

## 2019-04-12 NOTE — Telephone Encounter (Signed)
Received letter from [place] that patient no showed appointment. Will need new referral if she desires to go.

## 2019-04-13 ENCOUNTER — Other Ambulatory Visit: Payer: Self-pay

## 2019-04-13 ENCOUNTER — Ambulatory Visit
Admission: RE | Admit: 2019-04-13 | Discharge: 2019-04-13 | Disposition: A | Discharge status: Home / Self Care | Payer: Medicare Other | Source: Ambulatory Visit | Attending: Family Medicine | Admitting: Family Medicine

## 2019-04-13 ENCOUNTER — Ambulatory Visit: Payer: Medicare Other

## 2019-04-13 DIAGNOSIS — K760 Fatty (change of) liver, not elsewhere classified: Secondary | ICD-10-CM | POA: Diagnosis not present

## 2019-04-13 DIAGNOSIS — R7401 Elevation of levels of liver transaminase levels: Secondary | ICD-10-CM | POA: Diagnosis not present

## 2019-05-03 DIAGNOSIS — M47816 Spondylosis without myelopathy or radiculopathy, lumbar region: Secondary | ICD-10-CM | POA: Diagnosis not present

## 2019-05-09 ENCOUNTER — Encounter: Payer: Self-pay | Admitting: Family Medicine

## 2019-05-09 ENCOUNTER — Other Ambulatory Visit: Payer: Self-pay

## 2019-05-09 ENCOUNTER — Ambulatory Visit (INDEPENDENT_AMBULATORY_CARE_PROVIDER_SITE_OTHER): Payer: Medicare Other | Admitting: Family Medicine

## 2019-05-09 VITALS — BP 134/70 | HR 72 | Temp 97.9°F | Ht 65.0 in | Wt 228.6 lb

## 2019-05-09 DIAGNOSIS — R609 Edema, unspecified: Secondary | ICD-10-CM

## 2019-05-09 DIAGNOSIS — G894 Chronic pain syndrome: Secondary | ICD-10-CM | POA: Diagnosis not present

## 2019-05-09 DIAGNOSIS — F319 Bipolar disorder, unspecified: Secondary | ICD-10-CM | POA: Diagnosis not present

## 2019-05-09 DIAGNOSIS — K760 Fatty (change of) liver, not elsewhere classified: Secondary | ICD-10-CM

## 2019-05-09 DIAGNOSIS — J449 Chronic obstructive pulmonary disease, unspecified: Secondary | ICD-10-CM | POA: Insufficient documentation

## 2019-05-09 DIAGNOSIS — G8929 Other chronic pain: Secondary | ICD-10-CM

## 2019-05-09 DIAGNOSIS — Z6835 Body mass index (BMI) 35.0-35.9, adult: Secondary | ICD-10-CM

## 2019-05-09 DIAGNOSIS — K219 Gastro-esophageal reflux disease without esophagitis: Secondary | ICD-10-CM | POA: Diagnosis not present

## 2019-05-09 DIAGNOSIS — J432 Centrilobular emphysema: Secondary | ICD-10-CM

## 2019-05-09 MED ORDER — ARIPIPRAZOLE 5 MG PO TABS: 5.0000 mg | ORAL_TABLET | Freq: Every day | ORAL | 6 refills | Status: DC

## 2019-05-09 MED ORDER — ETODOLAC 400 MG PO TABS: 400.0000 mg | ORAL_TABLET | Freq: Two times a day (BID) | ORAL | 3 refills | Status: DC | PRN

## 2019-05-09 MED ORDER — DIAZEPAM 10 MG PO TABS: 10.0000 mg | ORAL_TABLET | Freq: Two times a day (BID) | ORAL | 0 refills | Status: DC

## 2019-05-09 MED ORDER — ALPHA-LIPOIC ACID 600 MG PO CAPS: 1.0000 | ORAL_CAPSULE | Freq: Every day | ORAL | 6 refills | Status: DC

## 2019-05-09 MED ORDER — ALPRAZOLAM 1 MG PO TABS: 1.0000 mg | ORAL_TABLET | Freq: Every day | ORAL | 0 refills | Status: DC | PRN

## 2019-05-09 MED ORDER — CETIRIZINE HCL 10 MG PO TABS: 10.0000 mg | ORAL_TABLET | Freq: Every day | ORAL | 3 refills | Status: DC

## 2019-05-09 MED ORDER — OXYCODONE-ACETAMINOPHEN 7.5-325 MG PO TABS: 1.0000 | ORAL_TABLET | Freq: Three times a day (TID) | ORAL | 0 refills | Status: DC | PRN

## 2019-05-09 MED ORDER — COLCHICINE 0.6 MG PO TABS: 0.6000 mg | ORAL_TABLET | Freq: Every day | ORAL | 3 refills | Status: DC | PRN

## 2019-05-09 MED ORDER — GLUCOSAMINE-CHONDROITIN 500-400 MG PO TABS: 2.0000 | ORAL_TABLET | Freq: Every day | ORAL | 3 refills | Status: DC

## 2019-05-09 NOTE — Progress Notes (Signed)
This visit was conducted in person.  BP 134/70 (BP Location: Left Arm, Patient Position: Sitting, Cuff Size: Large)   Pulse 72   Temp 97.9 F (36.6 C) (Temporal)   Ht 5\' 5"  (1.651 m)   Wt 228 lb 9 oz (103.7 kg)   SpO2 97%   BMI 38.03 kg/m    CC: f/u visit, chronic pain management f/u Subjective:    Patient ID: Kaylee [name], female    DOB: 25-Jan-1961, 58 y.o.   MRN: [id number]  HPI: Kaylee [name] is a 58 y.o. female presenting on 05/09/2019 for Follow-up   Last seen 02/2019 for medicare wellness visit.  Children's biological father killed in mowing accident 01/2019. Worsened depression/anxiety after this. We added celexa last 10mg  visit. She has noted benefit with this.   Pt did not show to her [facility] psychiatry appointment. She saw daughter's PCP NP at [facility], abilify was increased to 5mg  dose. This has helped. They recommended echo and carotid [facility]. However, she already had reassuring echo 2015.   Recent [facility] ER visit for pedal edema, s/p reassuring evaluation, records reviewed.   Chronic lumbar back pain on oxycodone 7.5/325mg  TID PRN. Recent RFA of L then R L3-5 medial branch nerves by Dr [place] at [facility].   Had lung cancer screening CT scan - mild centrilobular emphysema - she regularly takes spiriva and advair. Also found 92mm RUL nodule.   Weight gain noted since 08/2018 (214 lbs).  She received pneumovax this month.      Relevant past medical, surgical, family and social history reviewed and updated as indicated. Interim medical history since our last visit reviewed. Allergies and medications reviewed and updated. Outpatient Medications Prior to Visit  Medication Sig Dispense Refill  . acyclovir (ZOVIRAX) 400 MG tablet Take 1 tablet (400 mg total) by mouth 2 (two) times daily. 60 tablet 6  . albuterol (PROAIR HFA) 108 (90 Base) MCG/ACT inhaler INHALE 2 PUFFS INTO THE LUNGS EVERY 4 HOURS AS NEEDED 8.5 g 3  . allopurinol (ZYLOPRIM) 100 MG  tablet Take 1 tablet (100 mg total) by mouth daily. 90 tablet 3  . aspirin 81 MG chewable tablet Chew 1 tablet by mouth daily.    [facility] atorvastatin (LIPITOR) 10 MG tablet Take 1 tablet (10 mg total) by mouth daily. 90 tablet 3  . citalopram (CELEXA) 10 MG tablet Take 1 tablet (10 mg total) by mouth daily. 30 tablet 3  . dicyclomine (BENTYL) 10 MG capsule TAKE 1 CAPSULE BY MOUTH 3 TIMES DAILY ASNEEDED FOR SPASMS. TAKE MEDICATION WITH MEALS. 60 capsule 3  . esomeprazole (NEXIUM) 40 MG capsule Take 1 capsule (40 mg total) by mouth daily. 90 capsule 3  . estradiol (ESTRACE) 0.1 MG/GM vaginal cream Apply 1 gram per vagina every night for 2 weeks, then apply three times a week 30 g 12  . fluticasone (FLONASE) 50 MCG/ACT nasal spray Place 2 sprays into both nostrils daily. 16 g 11  . Fluticasone-Salmeterol (ADVAIR DISKUS) 250-50 MCG/DOSE AEPB INHALE 1 PUFF BY MOUTH TWICE A DAY 60 each 11  . furosemide (LASIX) 20 MG tablet Take 1 tablet (20 mg total) by mouth daily as needed. 90 tablet 1  . linaclotide (LINZESS) 290 MCG CAPS capsule TAKE 1 CAPSULE BY MOUTH DAILY BEFORE BREAKFAST 30 capsule 11  . methocarbamol (ROBAXIN) 750 MG tablet Take 1 tablet (750 mg total) by mouth 2 (two) times daily as needed for muscle spasms. 60 tablet 3  . NEOMYCIN-POLYMYXIN-HYDROCORTISONE (CORTISPORIN)  1 % SOLN otic solution PLACE 3 DROPS INTO THE LEFT EAR 3 TIMES A DAY AS DIRECTED 10 mL 1  . nitrofurantoin (MACRODANTIN) 100 MG capsule Take 1 capsule (100 mg total) by mouth daily. 90 capsule 1  . omega-3 acid ethyl esters (LOVAZA) 1 g capsule Take 2 capsules (2 g total) by mouth daily. 60 capsule 11  . OVER THE COUNTER MEDICATION Take 2 tablets by mouth as needed. IBgard    . potassium chloride (K-DUR) 10 MEQ tablet Take 1 tablet (10 mEq total) by mouth daily as needed. 90 tablet 1  . promethazine (PHENERGAN) 12.5 MG tablet Take 12.5 mg by mouth. As needed    . tiotropium (SPIRIVA HANDIHALER) 18 MCG inhalation capsule Place 1  capsule (18 mcg total) into inhaler and inhale daily. 90 capsule 3  . tiZANidine (ZANAFLEX) 4 MG tablet Take 1 tablet (4 mg total) by mouth 2 (two) times daily as needed for muscle spasms. 60 tablet 3  . trimethoprim-polymyxin b (POLYTRIM) ophthalmic solution Place 1 drop into the left eye every 6 (six) hours. 10 mL 0  . Alpha-Lipoic Acid 600 MG CAPS Take 1 capsule by mouth daily.    [name]. ALPRAZolam (XANAX) 1 MG tablet TAKE 1 TABLET BY MOUTH EVERY DAY AS NEEDED FOR ANXIETY 10 tablet 0  . ARIPiprazole (ABILIFY) 5 MG tablet Take 5 mg by mouth daily.    . cetirizine (ZYRTEC) 10 MG tablet TAKE 1 TABLET BY MOUTH DAILY 90 tablet 0  . colchicine 0.6 MG tablet TAKE 1 TABLET BY MOUTH DAILY AS NEEDED. 30 tablet 0  . diazepam (VALIUM) 10 MG tablet TAKE 1 TABLET BY MOUTH TWICE A DAY 60 tablet 0  . etodolac (LODINE) 400 MG tablet Take 1 tablet (400 mg total) by mouth 2 (two) times daily as needed for moderate pain. 60 tablet 3  . famotidine (PEPCID) 40 MG tablet Take 0.5 tablets (20 mg total) by mouth daily. 30 tablet 1  . glucosamine-chondroitin 500-400 MG tablet Take 2 tablets by mouth daily. 180 tablet 3  . oxyCODONE-acetaminophen (PERCOCET) 7.5-325 MG tablet TAKE 1 TABLET BY MOUTH EVERY 8 HOURS AS NEEDED FOR PAIN 90 tablet 0  . ARIPiprazole (ABILIFY) 2 MG tablet Take 1 tablet (2 mg total) by mouth daily. 90 tablet 3   No facility-administered medications prior to visit.      Per HPI unless specifically indicated in ROS section below Review of Systems Objective:    BP 134/70 (BP Location: Left Arm, Patient Position: Sitting, Cuff Size: Large)   Pulse 72   Temp 97.9 F (36.6 C) (Temporal)   Ht 5\' 5"  (1.651 m)   Wt 228 lb 9 oz (103.7 kg)   SpO2 97%   BMI 38.03 kg/m   Wt Readings from Last 3 Encounters:  05/09/19 228 lb 9 oz (103.7 kg)  03/27/19 228 lb (103.4 kg)  02/17/19 228 lb 8 oz (103.6 kg)    Physical Exam Vitals signs and nursing note reviewed.  Constitutional:      General: She is not  in acute distress.    Appearance: Normal appearance. She is obese. She is not ill-appearing.  HENT:     Head: Normocephalic and atraumatic.     Mouth/Throat:     Mouth: Mucous membranes are moist.     Pharynx: Oropharynx is clear. No posterior oropharyngeal erythema.  Eyes:     Extraocular Movements: Extraocular movements intact.     Pupils: Pupils are equal, round, and reactive to light.  Neck:     Musculoskeletal: Normal range of motion and neck supple.     Vascular: No carotid bruit.  Cardiovascular:     Rate and Rhythm: Normal rate and regular rhythm.     Pulses: Normal pulses.     Heart sounds: Murmur (faint 2/6 systolic) present.  Pulmonary:     Effort: Pulmonary effort is normal. No respiratory distress.     Breath sounds: Normal breath sounds. No wheezing, rhonchi or rales.  Abdominal:     General: There is no distension.     Palpations: Abdomen is soft. There is no hepatomegaly, splenomegaly or mass.     Tenderness: There is no abdominal tenderness. There is no guarding or rebound. Negative signs include Murphy's sign.     Hernia: No hernia is present.  Musculoskeletal:     Right lower leg: No edema.     Left lower leg: No edema.  Lymphadenopathy:     Cervical: No cervical adenopathy.  Skin:    General: Skin is warm and dry.     Capillary Refill: Capillary refill takes less than 2 seconds.     Findings: No rash.  Neurological:     Mental Status: She is alert.  Psychiatric:        Mood and Affect: Mood normal.        Behavior: Behavior normal.       Results for orders placed or performed in visit on 03/24/19  Hemoglobin A1c  Result Value Ref Range   Hgb A1c MFr Bld 5.7 4.6 - 6.5 %  Hepatitis panel, acute  Result Value Ref Range   Hep A IgM NON-REACTIVE NON-REACTI   Hepatitis B Surface Ag NON-REACTIVE NON-REACTI   Hep B C IgM NON-REACTIVE NON-REACTI   Hepatitis C Ab NON-REACTIVE NON-REACTI   SIGNAL TO CUT-OFF 0.00 <1.00  Ferritin  Result Value Ref Range    Ferritin 62.3 10.0 - 291.0 ng/mL  Lipid panel  Result Value Ref Range   Cholesterol 185 0 - 200 mg/dL   Triglycerides 224.0 (H) 0.0 - 149.0 mg/dL   HDL 63.90 >39.00 mg/dL   VLDL 44.8 (H) 0.0 - 40.0 mg/dL   Total CHOL/HDL Ratio 3    NonHDL 121.55   Comprehensive metabolic panel  Result Value Ref Range   Sodium 141 135 - 145 mEq/L   Potassium 3.5 3.5 - 5.1 mEq/L   Chloride 102 96 - 112 mEq/L   CO2 29 19 - 32 mEq/L   Glucose, Bld 91 70 - 99 mg/dL   BUN 16 6 - 23 mg/dL   Creatinine, Ser 0.89 0.40 - 1.20 mg/dL   Total Bilirubin 0.6 0.2 - 1.2 mg/dL   Alkaline Phosphatase 73 39 - 117 U/L   AST 42 (H) 0 - 37 U/L   ALT 43 (H) 0 - 35 U/L   Total Protein 6.4 6.0 - 8.3 g/dL   Albumin 4.2 3.5 - 5.2 g/dL   Calcium 8.9 8.4 - 10.5 mg/dL   GFR 64.83 >60.00 mL/min  LDL cholesterol, direct  Result Value Ref Range   Direct LDL 103.0 mg/dL   Assessment & Plan:   Problem List Items Addressed This Visit    Severe obesity (BMI 35.0-35.9 with comorbidity) ([place])    Continue to encourage healthy diet and lifestyle changes to affect sustainable weight loss.       Peripheral edema    Recent exacerbation, now doing better. Unclear cause. ER eval overall reassuring. Encouraged ongoing efforts at sustainable weight loss.  NAFLD (nonalcoholic fatty liver disease)    Reviewed diagnosis with patient.  Encouraged limiting fatty greasy foods, work towards cholesterol control and weight loss. With transaminitis concern for NASH - will continue to monitor.       GERD (gastroesophageal reflux disease)    Chronic, stable on nexium daily. Did not try pepcid.       Encounter for chronic pain management    [place] CSRS reviewed. OA related. S/p recent radiofrequency ablation of lumbar medial brach nerves. Continue oxycodone.       COPD (chronic obstructive pulmonary disease) (HCC)    By recent lung screening CT. She is regularly using her advair and spiriva.       Relevant Medications   cetirizine  (ZYRTEC) 10 MG tablet   Chronic pain syndrome    OA, lumbar spondylosis, recent spine interventions through [facility] pain clinic (Dr Eustaquio Maize)      Relevant Medications   etodolac (LODINE) 400 MG tablet   oxyCODONE-acetaminophen (PERCOCET) 7.5-325 MG tablet   Bipolar 1 disorder (HCC)    Doing well on current regimen - abilify 5mg , celexa 10mg  daily, valium with PRN rare xanax. Will continue. Pt aware may need to return to psych if worsening mood           Meds ordered this encounter  Medications  . Alpha-Lipoic Acid 600 MG CAPS    Sig: Take 1 capsule (600 mg total) by mouth daily.    Dispense:  60 capsule    Refill:  6  . ALPRAZolam (XANAX) 1 MG tablet    Sig: Take 1 tablet (1 mg total) by mouth daily as needed for anxiety.    Dispense:  10 tablet    Refill:  0  . cetirizine (ZYRTEC) 10 MG tablet    Sig: Take 1 tablet (10 mg total) by mouth daily.    Dispense:  90 tablet    Refill:  3  . colchicine 0.6 MG tablet    Sig: Take 1 tablet (0.6 mg total) by mouth daily as needed.    Dispense:  30 tablet    Refill:  3  . diazepam (VALIUM) 10 MG tablet    Sig: Take 1 tablet (10 mg total) by mouth 2 (two) times daily.    Dispense:  60 tablet    Refill:  0  . etodolac (LODINE) 400 MG tablet    Sig: Take 1 tablet (400 mg total) by mouth 2 (two) times daily as needed for moderate pain.    Dispense:  60 tablet    Refill:  3  . glucosamine-chondroitin 500-400 MG tablet    Sig: Take 2 tablets by mouth daily.    Dispense:  180 tablet    Refill:  3  . oxyCODONE-acetaminophen (PERCOCET) 7.5-325 MG tablet    Sig: Take 1 tablet by mouth every 8 (eight) hours as needed. for pain    Dispense:  90 tablet    Refill:  0  . ARIPiprazole (ABILIFY) 5 MG tablet    Sig: Take 1 tablet (5 mg total) by mouth daily.    Dispense:  30 tablet    Refill:  6   No orders of the defined types were placed in this encounter.  Patient instructions: Your recent liver ultrasound showed fatty liver changes. See  below handout for more information.  Good to see you today Return as needed or in 3 months for follow up visit.   Follow up plan: Return in about 3 months (around 08/09/2019) for  follow up visit.  [name], MD

## 2019-05-09 NOTE — Assessment & Plan Note (Signed)
By recent lung screening CT. She is regularly using her advair and spiriva.

## 2019-05-09 NOTE — Assessment & Plan Note (Signed)
[place] CSRS reviewed. OA related. S/p recent radiofrequency ablation of lumbar medial brach nerves. Continue oxycodone.

## 2019-05-09 NOTE — Assessment & Plan Note (Addendum)
Recent exacerbation, now doing better. Unclear cause. ER eval overall reassuring. Encouraged ongoing efforts at sustainable weight loss.

## 2019-05-09 NOTE — Assessment & Plan Note (Signed)
Doing well on current regimen - abilify 5mg , celexa 10mg  daily, valium with PRN rare xanax. Will continue. Pt aware may need to return to psych if worsening mood

## 2019-05-09 NOTE — Assessment & Plan Note (Signed)
Continue to encourage healthy diet and lifestyle changes to affect sustainable weight loss.  

## 2019-05-09 NOTE — Assessment & Plan Note (Signed)
Reviewed diagnosis with patient.  Encouraged limiting fatty greasy foods, work towards cholesterol control and weight loss. With transaminitis concern for NASH - will continue to monitor.

## 2019-05-09 NOTE — Patient Instructions (Addendum)
Your recent liver ultrasound showed fatty liver changes. See below handout for more information.  Good to see you today Return as needed or in 3 months for follow up visit.   Fatty Liver Disease  Fatty liver disease occurs when too much fat has built up in your liver cells. Fatty liver disease is also called hepatic steatosis or steatohepatitis. The liver removes harmful substances from your bloodstream and produces fluids that your body needs. It also helps your body use and store energy from the food you eat. In many cases, fatty liver disease does not cause symptoms or problems. It is often diagnosed when tests are being done for other reasons. However, over time, fatty liver can cause inflammation that may lead to more serious liver problems, such as scarring of the liver (cirrhosis) and liver failure. Fatty liver is associated with insulin resistance, increased body fat, high blood pressure (hypertension), and high cholesterol. These are features of metabolic syndrome and increase your risk for stroke, diabetes, and heart disease. What are the causes? This condition may be caused by:  Drinking too much alcohol.  Poor nutrition.  Obesity.  Cushing's syndrome.  Diabetes.  High cholesterol.  Certain drugs.  Poisons.  Some viral infections.  Pregnancy. What increases the risk? You are more likely to develop this condition if you:  Abuse alcohol.  Are overweight.  Have diabetes.  Have hepatitis.  Have a high triglyceride level.  Are pregnant. What are the signs or symptoms? Fatty liver disease often does not cause symptoms. If symptoms do develop, they can include:  Fatigue.  Weakness.  Weight loss.  Confusion.  Abdominal pain.  Nausea and vomiting.  Yellowing of your skin and the white parts of your eyes (jaundice).  Itchy skin. How is this diagnosed? This condition may be diagnosed by:  A physical exam and medical history.  Blood tests.  Imaging  tests, such as an ultrasound, CT scan, or MRI.  A liver biopsy. A small sample of liver tissue is removed using a needle. The sample is then looked at under a microscope. How is this treated? Fatty liver disease is often caused by other health conditions. Treatment for fatty liver may involve medicines and lifestyle changes to manage conditions such as:  Alcoholism.  High cholesterol.  Diabetes.  Being overweight or obese. Follow these instructions at home:   Do not drink alcohol. If you have trouble quitting, ask your health care provider how to safely quit with the help of medicine or a supervised program. This is important to keep your condition from getting worse.  Eat a healthy diet as told by your health care provider. Ask your health care provider about working with a diet and nutrition specialist (dietitian) to develop an eating plan.  Exercise regularly. This can help you lose weight and control your cholesterol and diabetes. Talk to your health care provider about an exercise plan and which activities are best for you.  Take over-the-counter and prescription medicines only as told by your health care provider.  Keep all follow-up visits as told by your health care provider. This is important. Contact a health care provider if: You have trouble controlling your:  Blood sugar. This is especially important if you have diabetes.  Cholesterol.  Drinking of alcohol. Get help right away if:  You have abdominal pain.  You have jaundice.  You have nausea and vomiting.  You vomit blood or material that looks like coffee grounds.  You have stools that are black,  tar-like, or bloody. Summary  Fatty liver disease develops when too much fat builds up in the cells of your liver.  Fatty liver disease often causes no symptoms or problems. However, over time, fatty liver can cause inflammation that may lead to more serious liver problems, such as scarring of the liver  (cirrhosis).  You are more likely to develop this condition if you abuse alcohol, are pregnant, are overweight, have diabetes, have hepatitis, or have high triglyceride levels.  Contact your health care provider if you have trouble controlling your weight, blood sugar, cholesterol, or drinking of alcohol. This information is not intended to replace advice given to you by your health care provider. Make sure you discuss any questions you have with your health care provider. Document Released: 08/14/2005 Document Revised: 06/11/2017 Document Reviewed: 04/07/2017 Elsevier Patient Education  2020 [employer].

## 2019-05-09 NOTE — Assessment & Plan Note (Signed)
Chronic, stable on nexium daily. Did not try pepcid.

## 2019-05-09 NOTE — Assessment & Plan Note (Signed)
OA, lumbar spondylosis, recent spine interventions through [facility] pain clinic (Dr Carola Rhine)

## 2019-05-15 ENCOUNTER — Encounter: Payer: Self-pay | Admitting: Family Medicine

## 2019-05-15 DIAGNOSIS — K7689 Other specified diseases of liver: Secondary | ICD-10-CM | POA: Insufficient documentation

## 2019-05-19 ENCOUNTER — Ambulatory Visit: Payer: Medicare Other | Admitting: Family Medicine

## 2019-06-05 ENCOUNTER — Other Ambulatory Visit: Payer: Self-pay | Admitting: Family Medicine

## 2019-06-06 NOTE — Telephone Encounter (Signed)
Name of Medication: Oxycodone-APAP, Alprazolam, Diazepam Name of Pharmacy: [place] or Written Date and Quantity: 05/12/19      Oxycodone-APAP- #90      Alprazolam- #10      Diazepam- #60 Last Office Visit and Type: 05/09/19, chronic pain mngmt Next Office Visit and Type: 07/17/19, 3 mo chronic pain mngmt Last Controlled Substance Agreement Date: 02/17/19 Last UDS: 02/17/19  Citalopram last filled:  05/12/19, #30 Neomycin-poly last filled:  11/02/16, #10 ml

## 2019-06-07 NOTE — Telephone Encounter (Signed)
ERx 

## 2019-07-13 ENCOUNTER — Telehealth: Payer: Self-pay | Admitting: Family Medicine

## 2019-07-13 MED ORDER — FUROSEMIDE 20 MG PO TABS: 20.0000 mg | ORAL_TABLET | Freq: Every day | ORAL | 2 refills | Status: DC | PRN

## 2019-07-13 NOTE — Telephone Encounter (Signed)
Pt stated she is out of her lasix [facility]   Pt mentioned she will let you know this on monday

## 2019-07-13 NOTE — Telephone Encounter (Signed)
E-scribed refill 

## 2019-07-17 ENCOUNTER — Other Ambulatory Visit: Payer: Self-pay

## 2019-07-17 ENCOUNTER — Other Ambulatory Visit: Payer: Self-pay | Admitting: Family Medicine

## 2019-07-17 ENCOUNTER — Ambulatory Visit (INDEPENDENT_AMBULATORY_CARE_PROVIDER_SITE_OTHER): Payer: Medicare Other | Admitting: Family Medicine

## 2019-07-17 ENCOUNTER — Encounter: Payer: Self-pay | Admitting: Family Medicine

## 2019-07-17 VITALS — BP 136/84 | HR 89 | Temp 97.9°F | Ht 65.0 in | Wt 233.8 lb

## 2019-07-17 DIAGNOSIS — J019 Acute sinusitis, unspecified: Secondary | ICD-10-CM

## 2019-07-17 DIAGNOSIS — M5432 Sciatica, left side: Secondary | ICD-10-CM | POA: Diagnosis not present

## 2019-07-17 DIAGNOSIS — J329 Chronic sinusitis, unspecified: Secondary | ICD-10-CM

## 2019-07-17 DIAGNOSIS — R609 Edema, unspecified: Secondary | ICD-10-CM | POA: Diagnosis not present

## 2019-07-17 DIAGNOSIS — G894 Chronic pain syndrome: Secondary | ICD-10-CM | POA: Diagnosis not present

## 2019-07-17 DIAGNOSIS — J40 Bronchitis, not specified as acute or chronic: Secondary | ICD-10-CM

## 2019-07-17 DIAGNOSIS — G8929 Other chronic pain: Secondary | ICD-10-CM | POA: Diagnosis not present

## 2019-07-17 LAB — COMPREHENSIVE METABOLIC PANEL
ALT: 37 U/L — ABNORMAL HIGH (ref 0–35)
AST: 26 U/L (ref 0–37)
Albumin: 4.4 g/dL (ref 3.5–5.2)
Alkaline Phosphatase: 78 U/L (ref 39–117)
BUN: 11 mg/dL (ref 6–23)
CO2: 30 mEq/L (ref 19–32)
Calcium: 9.6 mg/dL (ref 8.4–10.5)
Chloride: 101 mEq/L (ref 96–112)
Creatinine, Ser: 0.82 mg/dL (ref 0.40–1.20)
GFR: 71.19 mL/min (ref >60.00)
Glucose, Bld: 118 mg/dL — ABNORMAL HIGH (ref 70–99)
Potassium: 4 mEq/L (ref 3.5–5.1)
Sodium: 141 mEq/L (ref 135–145)
Total Bilirubin: 0.5 mg/dL (ref 0.2–1.2)
Total Protein: 7.1 g/dL (ref 6.0–8.3)

## 2019-07-17 MED ORDER — FLUCONAZOLE 150 MG PO TABS: 150.0000 mg | ORAL_TABLET | Freq: Once | ORAL | 0 refills | Status: AC

## 2019-07-17 MED ORDER — ALPRAZOLAM 1 MG PO TABS: 1.0000 mg | ORAL_TABLET | Freq: Every day | ORAL | 0 refills | Status: DC | PRN

## 2019-07-17 MED ORDER — DIAZEPAM 10 MG PO TABS: 10.0000 mg | ORAL_TABLET | Freq: Two times a day (BID) | ORAL | 0 refills | Status: DC

## 2019-07-17 MED ORDER — AMOXICILLIN-POT CLAVULANATE 875-125 MG PO TABS: 1.0000 | ORAL_TABLET | Freq: Two times a day (BID) | ORAL | 0 refills | Status: AC

## 2019-07-17 MED ORDER — ARIPIPRAZOLE 5 MG PO TABS: 5.0000 mg | ORAL_TABLET | Freq: Every day | ORAL | 6 refills | Status: DC

## 2019-07-17 MED ORDER — OXYCODONE-ACETAMINOPHEN 7.5-325 MG PO TABS: 1.0000 | ORAL_TABLET | Freq: Three times a day (TID) | ORAL | 0 refills | Status: DC | PRN

## 2019-07-17 MED ORDER — FUROSEMIDE 40 MG PO TABS: 40.0000 mg | ORAL_TABLET | Freq: Every day | ORAL | 3 refills | Status: DC | PRN

## 2019-07-17 NOTE — Assessment & Plan Note (Signed)
Stable period. States lasix 40mg  more effective - check Cr/K today.

## 2019-07-17 NOTE — Assessment & Plan Note (Signed)
Concern for current acute flare - will treat with augmentin. rec daily flonase, nasal saline, continue antihistamine. Update if not improving with treatment. Diflucan also sent in per pt request.

## 2019-07-17 NOTE — Assessment & Plan Note (Signed)
[place] CSRS reviewed.  meds refilled today.

## 2019-07-17 NOTE — Assessment & Plan Note (Signed)
Upcoming [facility] pain clinic appt at end of month. She has had RFA previously.

## 2019-07-17 NOTE — Patient Instructions (Addendum)
For sinuses - start flonase and nasal saline daily. Continue zyrtec, may continue benadryl.  Push fluids and rest. Take augmentin antibiotic. Hold nitrofurantoin while on these antibiotics. Diflucan sent as well.  Hydrocodone and valium refilled today.

## 2019-07-17 NOTE — Progress Notes (Signed)
This visit was conducted in person.  BP 136/84    Pulse 89    Temp 97.9 F (36.6 C) (Skin)    Ht 5\' 5"  (1.651 m)    Wt 233 lb 12.8 oz (106.1 kg)    SpO2 98%    BMI 38.91 kg/m    CC: med refill visit Subjective:    Patient ID: Kalyiah A Roussell, female    DOB: 1961/02/20, 59 y.o.   MRN: [id number]  HPI: Queenie A Huizinga is a 59 y.o. female presenting on 07/17/2019 for Follow-up (medication refill)   Chronic lumbar back pain on oxycodone 7.5/325mg  TID PRN. Recent RFA of L then R L3-5 medial branch nerves by Dr 09/14/2019 at [facility]. Next appt 08/10/2019.   5 lb weight gain noted. She has been taking furosemide 40mg  daily for the past 3 weeks. Requests increased dose. States 20mg  dose was ineffective.   2wk h/o bilateral maxillary and frontal facial pain, post nasal drainage, nasl congestion. No fevers/chills, cough, dyspnea, ST. She is taking zyrtec, benadryl. No ear or tooth pain. She is using OTC nasal spray, doesn't know which she's taking. Was in bed last 2 days due to sinus headache.   Has started working 4-5 hours/day - at 08/12/2019.      Relevant past medical, surgical, family and social history reviewed and updated as indicated. Interim medical history since our last visit reviewed. Allergies and medications reviewed and updated. Outpatient Medications Prior to Visit  Medication Sig Dispense Refill   acyclovir (ZOVIRAX) 400 MG tablet Take 1 tablet (400 mg total) by mouth 2 (two) times daily. 60 tablet 6   albuterol (PROAIR HFA) 108 (90 Base) MCG/ACT inhaler INHALE 2 PUFFS INTO THE LUNGS EVERY 4 HOURS AS NEEDED 8.5 g 3   allopurinol (ZYLOPRIM) 100 MG tablet Take 1 tablet (100 mg total) by mouth daily. 90 tablet 3   Alpha-Lipoic Acid 600 MG CAPS Take 1 capsule (600 mg total) by mouth daily. 60 capsule 6   aspirin 81 MG chewable tablet Chew 1 tablet by mouth daily.     atorvastatin (LIPITOR) 10 MG tablet Take 1 tablet (10 mg total) by mouth daily. 90 tablet 3   cetirizine  (ZYRTEC) 10 MG tablet Take 1 tablet (10 mg total) by mouth daily. 90 tablet 3   citalopram (CELEXA) 10 MG tablet TAKE 1 TABLET BY MOUTH DAILY 90 tablet 1   colchicine 0.6 MG tablet Take 1 tablet (0.6 mg total) by mouth daily as needed. 30 tablet 3   dicyclomine (BENTYL) 10 MG capsule TAKE 1 CAPSULE BY MOUTH 3 TIMES DAILY ASNEEDED FOR SPASMS. TAKE MEDICATION WITH MEALS. 60 capsule 3   esomeprazole (NEXIUM) 40 MG capsule Take 1 capsule (40 mg total) by mouth daily. 90 capsule 3   estradiol (ESTRACE) 0.1 MG/GM vaginal cream Apply 1 gram per vagina every night for 2 weeks, then apply three times a week 30 g 12   etodolac (LODINE) 400 MG tablet Take 1 tablet (400 mg total) by mouth 2 (two) times daily as needed for moderate pain. 60 tablet 3   fluticasone (FLONASE) 50 MCG/ACT nasal spray Place 2 sprays into both nostrils daily. 16 g 11   Fluticasone-Salmeterol (ADVAIR DISKUS) 250-50 MCG/DOSE AEPB INHALE 1 PUFF BY MOUTH TWICE A DAY 60 each 11   glucosamine-chondroitin 500-400 MG tablet Take 2 tablets by mouth daily. 180 tablet 3   linaclotide (LINZESS) 290 MCG CAPS capsule TAKE 1 CAPSULE BY MOUTH DAILY BEFORE BREAKFAST 30 capsule  11   methocarbamol (ROBAXIN) 750 MG tablet Take 1 tablet (750 mg total) by mouth 2 (two) times daily as needed for muscle spasms. 60 tablet 3   neomycin-polymyxin b-dexamethasone (MAXITROL) 3.5-10000-0.1 SUSP PUT ONE DROP INTO THE LEFT EYE AS NEEDED 5 mL 0   NEOMYCIN-POLYMYXIN-HYDROCORTISONE (CORTISPORIN) 1 % SOLN OTIC solution PLACE 3 DROPS INTO THE LEFT EAR 3 TIMES A DAY AS DIRECTED 10 mL 0   nitrofurantoin (MACRODANTIN) 100 MG capsule Take 1 capsule (100 mg total) by mouth daily. 90 capsule 1   omega-3 acid ethyl esters (LOVAZA) 1 g capsule Take 2 capsules (2 g total) by mouth daily. 60 capsule 11   OVER THE COUNTER MEDICATION Take 2 tablets by mouth as needed. IBgard     potassium chloride (K-DUR) 10 MEQ tablet Take 1 tablet (10 mEq total) by mouth daily as  needed. 90 tablet 1   promethazine (PHENERGAN) 12.5 MG tablet Take 12.5 mg by mouth. As needed     tiotropium (SPIRIVA HANDIHALER) 18 MCG inhalation capsule Place 1 capsule (18 mcg total) into inhaler and inhale daily. 90 capsule 3   tiZANidine (ZANAFLEX) 4 MG tablet Take 1 tablet (4 mg total) by mouth 2 (two) times daily as needed for muscle spasms. 60 tablet 3   trimethoprim-polymyxin b (POLYTRIM) ophthalmic solution Place 1 drop into the left eye every 6 (six) hours. 10 mL 0   ALPRAZolam (XANAX) 1 MG tablet TAKE 1 TABLET BY MOUTH DAILY AS NEEDED FOR ANXIETY 10 tablet 0   ARIPiprazole (ABILIFY) 5 MG tablet Take 1 tablet (5 mg total) by mouth daily. 30 tablet 6   diazepam (VALIUM) 10 MG tablet TAKE 1 TABLET BY MOUTH TWICE A DAY 60 tablet 0   furosemide (LASIX) 20 MG tablet Take 1 tablet (20 mg total) by mouth daily as needed. 90 tablet 2   oxyCODONE-acetaminophen (PERCOCET) 7.5-325 MG tablet TAKE 1 TABLET BY MOUTH EVERY 8 HOURS AS NEEDED FOR PAIN 90 tablet 0   No facility-administered medications prior to visit.     Per HPI unless specifically indicated in ROS section below Review of Systems Objective:    BP 136/84    Pulse 89    Temp 97.9 F (36.6 C) (Skin)    Ht 5\' 5"  (1.651 m)    Wt 233 lb 12.8 oz (106.1 kg)    SpO2 98%    BMI 38.91 kg/m   Wt Readings from Last 3 Encounters:  07/17/19 233 lb 12.8 oz (106.1 kg)  05/09/19 228 lb 9 oz (103.7 kg)  03/27/19 228 lb (103.4 kg)    Physical Exam Vitals and nursing note reviewed.  Constitutional:      General: She is not in acute distress.    Appearance: Normal appearance. She is well-developed. She is obese. She is not ill-appearing.  HENT:     Head: Normocephalic and atraumatic.     Right Ear: Hearing, tympanic membrane, ear canal and external ear normal.     Left Ear: Hearing, ear canal and external ear normal.     Ears:     Comments: Chronic changes to L TM    Nose: No mucosal edema, congestion or rhinorrhea.     Right  Sinus: Maxillary sinus tenderness and frontal sinus tenderness present.     Left Sinus: Maxillary sinus tenderness and frontal sinus tenderness present.     Comments: Mild sinus tenderness throughout    Mouth/Throat:     Mouth: Mucous membranes are moist.  Pharynx: Oropharynx is clear. Uvula midline. No oropharyngeal exudate or posterior oropharyngeal erythema.     Tonsils: No tonsillar abscesses.  Eyes:     General: No scleral icterus.    Extraocular Movements: Extraocular movements intact.     Conjunctiva/sclera: Conjunctivae normal.     Pupils: Pupils are equal, round, and reactive to light.  Cardiovascular:     Rate and Rhythm: Normal rate and regular rhythm.     Pulses: Normal pulses.     Heart sounds: Normal heart sounds. No murmur.  Pulmonary:     Effort: Pulmonary effort is normal. No respiratory distress.     Breath sounds: Normal breath sounds. No wheezing, rhonchi or rales.  Musculoskeletal:     Cervical back: Normal range of motion and neck supple.  Lymphadenopathy:     Cervical: No cervical adenopathy.  Skin:    General: Skin is warm and dry.     Findings: No rash.  Neurological:     Mental Status: She is alert.  Psychiatric:        Mood and Affect: Mood normal.        Behavior: Behavior normal.       Results for orders placed or performed in visit on 03/24/19  Hemoglobin A1c  Result Value Ref Range   Hgb A1c MFr Bld 5.7 4.6 - 6.5 %  Hepatitis panel, acute  Result Value Ref Range   Hep A IgM NON-REACTIVE NON-REACTI   Hepatitis B Surface Ag NON-REACTIVE NON-REACTI   Hep B C IgM NON-REACTIVE NON-REACTI   Hepatitis C Ab NON-REACTIVE NON-REACTI   SIGNAL TO CUT-OFF 0.00 <1.00  Ferritin  Result Value Ref Range   Ferritin 62.3 10.0 - 291.0 ng/mL  Lipid panel  Result Value Ref Range   Cholesterol 185 0 - 200 mg/dL   Triglycerides 224.0 (H) 0.0 - 149.0 mg/dL   HDL 63.90 >39.00 mg/dL   VLDL 44.8 (H) 0.0 - 40.0 mg/dL   Total CHOL/HDL Ratio 3    NonHDL  121.55   Comprehensive metabolic panel  Result Value Ref Range   Sodium 141 135 - 145 mEq/L   Potassium 3.5 3.5 - 5.1 mEq/L   Chloride 102 96 - 112 mEq/L   CO2 29 19 - 32 mEq/L   Glucose, Bld 91 70 - 99 mg/dL   BUN 16 6 - 23 mg/dL   Creatinine, Ser 0.89 0.40 - 1.20 mg/dL   Total Bilirubin 0.6 0.2 - 1.2 mg/dL   Alkaline Phosphatase 73 39 - 117 U/L   AST 42 (H) 0 - 37 U/L   ALT 43 (H) 0 - 35 U/L   Total Protein 6.4 6.0 - 8.3 g/dL   Albumin 4.2 3.5 - 5.2 g/dL   Calcium 8.9 8.4 - 10.5 mg/dL   GFR 64.83 >60.00 mL/min  LDL cholesterol, direct  Result Value Ref Range   Direct LDL 103.0 mg/dL   Assessment & Plan:  This visit occurred during the SARS-CoV-2 public health emergency.  Safety protocols were in place, including screening questions prior to the visit, additional usage of staff PPE, and extensive cleaning of exam room while observing appropriate contact time as indicated for disinfecting solutions.   Problem List Items Addressed This Visit    Peripheral edema    Stable period. States lasix 40mg  more effective - check Cr/K today.       Relevant Orders   Comprehensive metabolic panel   Encounter for chronic pain management - Primary    [place] CSRS reviewed.  meds refilled today.       Chronic sinusitis with recurrent bronchitis    Concern for current acute flare - will treat with augmentin. rec daily flonase, nasal saline, continue antihistamine. Update if not improving with treatment. Diflucan also sent in per pt request.       Relevant Medications   amoxicillin-clavulanate (AUGMENTIN) 875-125 MG tablet   fluconazole (DIFLUCAN) 150 MG tablet   Chronic pain syndrome   Relevant Medications   oxyCODONE-acetaminophen (PERCOCET) 7.5-325 MG tablet   Back pain with left-sided sciatica    Upcoming [facility] pain clinic appt at end of month. She has had RFA previously.       Relevant Medications   oxyCODONE-acetaminophen (PERCOCET) 7.5-325 MG tablet   ALPRAZolam (XANAX) 1 MG tablet     diazepam (VALIUM) 10 MG tablet   ARIPiprazole (ABILIFY) 5 MG tablet    Other Visit Diagnoses    Acute non-recurrent sinusitis, unspecified location       Relevant Medications   amoxicillin-clavulanate (AUGMENTIN) 875-125 MG tablet   fluconazole (DIFLUCAN) 150 MG tablet       Meds ordered this encounter  Medications   furosemide (LASIX) 40 MG tablet    Sig: Take 1 tablet (40 mg total) by mouth daily as needed for fluid.    Dispense:  30 tablet    Refill:  3   oxyCODONE-acetaminophen (PERCOCET) 7.5-325 MG tablet    Sig: Take 1 tablet by mouth every 8 (eight) hours as needed. for pain    Dispense:  90 tablet    Refill:  0   ALPRAZolam (XANAX) 1 MG tablet    Sig: Take 1 tablet (1 mg total) by mouth daily as needed. for anxiety    Dispense:  10 tablet    Refill:  0   diazepam (VALIUM) 10 MG tablet    Sig: Take 1 tablet (10 mg total) by mouth 2 (two) times daily.    Dispense:  60 tablet    Refill:  0   amoxicillin-clavulanate (AUGMENTIN) 875-125 MG tablet    Sig: Take 1 tablet by mouth 2 (two) times daily for 10 days.    Dispense:  20 tablet    Refill:  0   fluconazole (DIFLUCAN) 150 MG tablet    Sig: Take 1 tablet (150 mg total) by mouth once for 1 dose.    Dispense:  1 tablet    Refill:  0   ARIPiprazole (ABILIFY) 5 MG tablet    Sig: Take 1 tablet (5 mg total) by mouth daily.    Dispense:  30 tablet    Refill:  6   Orders Placed This Encounter  Procedures   Comprehensive metabolic panel    Patient Instructions  For sinuses - start flonase and nasal saline daily. Continue zyrtec, may continue benadryl.  Push fluids and rest. Take augmentin antibiotic. Hold nitrofurantoin while on these antibiotics. Diflucan sent as well.  Hydrocodone and valium refilled today.    Follow up plan: Return in about 3 months (around 10/15/2019), or if symptoms worsen or fail to improve, for follow up visit.  [name], MD

## 2019-08-08 ENCOUNTER — Ambulatory Visit: Payer: Medicare Other | Admitting: Family Medicine

## 2019-08-08 ENCOUNTER — Other Ambulatory Visit: Payer: Self-pay

## 2019-08-09 ENCOUNTER — Ambulatory Visit: Payer: Medicare Other | Admitting: Family Medicine

## 2019-08-10 DIAGNOSIS — M47816 Spondylosis without myelopathy or radiculopathy, lumbar region: Secondary | ICD-10-CM | POA: Diagnosis not present

## 2019-08-10 DIAGNOSIS — M545 Low back pain: Secondary | ICD-10-CM | POA: Diagnosis not present

## 2019-08-10 DIAGNOSIS — G894 Chronic pain syndrome: Secondary | ICD-10-CM | POA: Diagnosis not present

## 2019-08-14 ENCOUNTER — Other Ambulatory Visit: Payer: Self-pay | Admitting: Family Medicine

## 2019-08-14 NOTE — Telephone Encounter (Signed)
Last office visit 07/17/2019 for chronic pain management.  Last refilled  Alprazolam 07/17/2019 for #10 with no refills.  Diazepam 07/17/2019 for #60 with no refills.  Percocet 07/17/2019 for #90 with no refills.  UDS/Contract 01/16/2019.  Next Appt: 10/09/2019 for 3 month follow up.

## 2019-08-16 NOTE — Telephone Encounter (Signed)
ERx 

## 2019-08-23 ENCOUNTER — Telehealth: Payer: Self-pay | Admitting: *Deleted

## 2019-08-23 MED ORDER — ADVAIR DISKUS 250-50 MCG/DOSE IN AEPB: 1.0000 | INHALATION_SPRAY | Freq: Two times a day (BID) | RESPIRATORY_TRACT | 6 refills | Status: DC

## 2019-08-23 NOTE — Telephone Encounter (Signed)
Victorino Dike from [employer] left a voicemail stating that patient has been getting generic Advair. Victorino Dike stated that patient has told her that this medication quits working after a few days.Victorino Dike wants to know if Dr. Sharen Hones will send over a new script for Advair for Brand [email or web address], so that they will only give the patient brand name only. Victorino Dike stated if this is in agreement to please send over a new script written for brand.

## 2019-08-23 NOTE — Telephone Encounter (Signed)
Brand advair sent to pharmacy.

## 2019-09-04 ENCOUNTER — Other Ambulatory Visit: Payer: Self-pay | Admitting: Family Medicine

## 2019-09-04 NOTE — Telephone Encounter (Signed)
Bentyl Last filled:  07/17/19, #60 Last OV:  08/08/19, chronic pain f/u Next OV:  10/09/19, chronic pain f/u

## 2019-09-05 ENCOUNTER — Other Ambulatory Visit: Payer: Self-pay | Admitting: Family Medicine

## 2019-09-05 NOTE — Telephone Encounter (Signed)
Name of Medication: Oxycodone, Alprazolam and Diazepam Name of Pharmacy: [facility] Last Fill or Written Date and Quantity: Oxycodone-08/16/19 #90        Alprazolam-08/16/19 #10        Diazepam 08/16/19 #60 Last Office Visit and Type: 07/17/19 routine follow up Next Office Visit and Type: 10/09/19 3 months follow up Last Controlled Substance Agreement Date: 02/17/2019 Last UDS: 02/17/2019

## 2019-09-08 NOTE — Telephone Encounter (Signed)
ERx 

## 2019-09-13 ENCOUNTER — Other Ambulatory Visit: Payer: Self-pay | Admitting: Family Medicine

## 2019-09-13 NOTE — Telephone Encounter (Signed)
Colchicine Last filled:  08/14/19, #30 Last OV:  08/08/19, chronic pain mngmt Next OV:  10/09/19, 3 mo chronic pain mngmt

## 2019-09-15 ENCOUNTER — Other Ambulatory Visit: Payer: Self-pay | Admitting: Family Medicine

## 2019-10-04 ENCOUNTER — Telehealth: Payer: Self-pay | Admitting: Family Medicine

## 2019-10-04 NOTE — Chronic Care Management (AMB) (Signed)
  Chronic Care Management   Note  10/04/2019 Name: Shandel Busic Holzhauer MRN: [id number] DOB: August 31, 1960  Jodell A Dunford is a 59 y.o. year old female who is a primary care patient of [name], MD. I reached out to [employer] by phone today in response to a referral sent by Ms. Markita A Alderman's PCP, [name], MD.   Ms. Letson was given information about Chronic Care Management services today including:  1. CCM service includes personalized support from designated clinical staff supervised by her physician, including individualized plan of care and coordination with other care providers 2. 24/7 contact phone numbers for assistance for urgent and routine care needs. 3. Service will only be billed when office clinical staff spend 20 minutes or more in a month to coordinate care. 4. Only one practitioner may furnish and bill the service in a calendar month. 5. The patient may stop CCM services at any time (effective at the end of the month) by phone call to the office staff.   Patient agreed to services and verbal consent obtained.   Follow up plan:   Raynicia Dukes [employer] Scheduler

## 2019-10-06 ENCOUNTER — Other Ambulatory Visit: Payer: Self-pay | Admitting: Family Medicine

## 2019-10-06 NOTE — Telephone Encounter (Signed)
Name of Medication: Alprazolam, Diazepam, Oxycodone-APAP Name of Pharmacy: [facility] Last Fill or Written Date and Quantity: 09/13/19      Alprazolam- #10      Diazepam- #60      Oxycodone-APAP- #90 Last Office Visit and Type: 08/08/19, chronic pain mngmt Next Office Visit and Type: 10/09/19, chronic pain mngmt Last Controlled Substance Agreement Date: 02/17/19 Last UDS: 02/17/19

## 2019-10-06 NOTE — Telephone Encounter (Signed)
ERx 

## 2019-10-06 NOTE — Telephone Encounter (Signed)
Name of Medication: Oxycodone, Alprazolam and Diazepam Name of Pharmacy: [facility] Last Fill or Written Date and Quantity: Oxycodone-09/08/19 #90                                                               Alprazolam-09/08/19 #10                                                               Diazepam 09/08/19 #60  Last Office Visit and Type: 07/17/19 routine follow up Next Office Visit and Type: 10/09/19 3 months follow up Last Controlled Substance Agreement Date: 02/17/2019 Last UDS: 02/17/2019

## 2019-10-09 ENCOUNTER — Other Ambulatory Visit: Payer: Self-pay

## 2019-10-09 ENCOUNTER — Encounter: Payer: Self-pay | Admitting: Family Medicine

## 2019-10-09 ENCOUNTER — Ambulatory Visit (INDEPENDENT_AMBULATORY_CARE_PROVIDER_SITE_OTHER): Payer: Medicare Other | Admitting: Family Medicine

## 2019-10-09 VITALS — BP 130/64 | HR 97 | Temp 97.7°F | Ht 65.0 in | Wt 226.4 lb

## 2019-10-09 DIAGNOSIS — G8929 Other chronic pain: Secondary | ICD-10-CM

## 2019-10-09 DIAGNOSIS — G894 Chronic pain syndrome: Secondary | ICD-10-CM

## 2019-10-09 DIAGNOSIS — K581 Irritable bowel syndrome with constipation: Secondary | ICD-10-CM | POA: Diagnosis not present

## 2019-10-09 DIAGNOSIS — R892 Abnormal level of other drugs, medicaments and biological substances in specimens from other organs, systems and tissues: Secondary | ICD-10-CM | POA: Diagnosis not present

## 2019-10-09 DIAGNOSIS — R21 Rash and other nonspecific skin eruption: Secondary | ICD-10-CM

## 2019-10-09 DIAGNOSIS — K5909 Other constipation: Secondary | ICD-10-CM

## 2019-10-09 DIAGNOSIS — M5432 Sciatica, left side: Secondary | ICD-10-CM

## 2019-10-09 DIAGNOSIS — J432 Centrilobular emphysema: Secondary | ICD-10-CM

## 2019-10-09 MED ORDER — NYSTATIN 100000 UNIT/GM EX CREA: 1.0000 "application " | TOPICAL_CREAM | Freq: Two times a day (BID) | CUTANEOUS | 0 refills | Status: DC

## 2019-10-09 NOTE — Assessment & Plan Note (Addendum)
[place] CSRS reviewed Update UDS today as prior (01/2019) was negative for oxycodone.

## 2019-10-09 NOTE — Assessment & Plan Note (Signed)
Sees [facility] pain clinic - received bilateral RFA L3-5 (2020), planned trigger point injections next.  PCP manages opiate prescription.

## 2019-10-09 NOTE — Assessment & Plan Note (Signed)
Reviewed COPD regimen of brand Advair, spiriva, PRN albuterol.

## 2019-10-09 NOTE — Assessment & Plan Note (Signed)
Anticipate intertrigo - Rx nystatin. Discussed keeping area dry, consider desitin or drying powder.

## 2019-10-09 NOTE — Assessment & Plan Note (Signed)
Managed with linzess and bisacodyl PRN.

## 2019-10-09 NOTE — Assessment & Plan Note (Signed)
UDS inappropriately negative for oxycodone 01/2019, on repeat 02/2019 UDS appropriate. Will repeat today.

## 2019-10-09 NOTE — Assessment & Plan Note (Signed)
Continue linzess 

## 2019-10-09 NOTE — Assessment & Plan Note (Signed)
Appreciate [facility] pain clinic interventional care.

## 2019-10-09 NOTE — Patient Instructions (Addendum)
If interested, check with pharmacy about new 2 shot shingles series (shingrix).  Consider covid vaccine.  You are doing well - continue current medicines.  Try nystatin cream under breasts.  Update UDS today.
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
## 2019-10-11 LAB — PAIN MGMT, PROFILE 8 W/CONF, U
6 Acetylmorphine: NEGATIVE ng/mL
Alcohol Metabolites: NEGATIVE ng/mL (ref <500)
Alphahydroxyalprazolam: NEGATIVE ng/mL
Alphahydroxymidazolam: NEGATIVE ng/mL
Alphahydroxytriazolam: NEGATIVE ng/mL
Aminoclonazepam: NEGATIVE ng/mL
Amphetamines: NEGATIVE ng/mL
Benzodiazepines: POSITIVE ng/mL
Buprenorphine, Urine: NEGATIVE ng/mL
Cocaine Metabolite: NEGATIVE ng/mL
Codeine: NEGATIVE ng/mL
Creatinine: 27.2 mg/dL
Hydrocodone: NEGATIVE ng/mL
Hydromorphone: NEGATIVE ng/mL
Hydroxyethylflurazepam: NEGATIVE ng/mL
Lorazepam: NEGATIVE ng/mL
MDMA: NEGATIVE ng/mL
Marijuana Metabolite: NEGATIVE ng/mL
Morphine: NEGATIVE ng/mL
Nordiazepam: 295 ng/mL
Norhydrocodone: NEGATIVE ng/mL
Noroxycodone: 360 ng/mL
Opiates: NEGATIVE ng/mL
Oxazepam: 639 ng/mL
Oxidant: NEGATIVE ug/mL
Oxycodone: 429 ng/mL
Oxycodone: POSITIVE ng/mL
Oxymorphone: 94 ng/mL
Temazepam: 536 ng/mL
pH: 7 (ref 4.5–9.0)

## 2019-10-24 ENCOUNTER — Telehealth: Payer: Self-pay

## 2019-10-24 DIAGNOSIS — K219 Gastro-esophageal reflux disease without esophagitis: Secondary | ICD-10-CM

## 2019-10-24 DIAGNOSIS — J432 Centrilobular emphysema: Secondary | ICD-10-CM

## 2019-10-24 NOTE — Telephone Encounter (Signed)
Per written referral from PCP, requesting referral in Epic for Kaylee [name] to chronic care management pharmacy services for the following conditions:   COPD [J44.9]  GERD [K21.9]  Phil Dopp, PharmD Clinical Pharmacist [facility] [phone]

## 2019-10-25 DIAGNOSIS — M7918 Myalgia, other site: Secondary | ICD-10-CM | POA: Diagnosis not present

## 2019-10-25 DIAGNOSIS — M545 Low back pain: Secondary | ICD-10-CM | POA: Diagnosis not present

## 2019-10-27 ENCOUNTER — Other Ambulatory Visit: Payer: Self-pay

## 2019-10-27 ENCOUNTER — Telehealth: Payer: Medicare Other

## 2019-10-27 ENCOUNTER — Ambulatory Visit: Payer: Medicare Other

## 2019-10-27 DIAGNOSIS — K760 Fatty (change of) liver, not elsewhere classified: Secondary | ICD-10-CM

## 2019-10-27 DIAGNOSIS — F411 Generalized anxiety disorder: Secondary | ICD-10-CM

## 2019-10-27 DIAGNOSIS — F319 Bipolar disorder, unspecified: Secondary | ICD-10-CM

## 2019-10-27 DIAGNOSIS — J432 Centrilobular emphysema: Secondary | ICD-10-CM

## 2019-10-27 DIAGNOSIS — M0600 Rheumatoid arthritis without rheumatoid factor, unspecified site: Secondary | ICD-10-CM

## 2019-10-27 DIAGNOSIS — K219 Gastro-esophageal reflux disease without esophagitis: Secondary | ICD-10-CM

## 2019-10-27 DIAGNOSIS — K581 Irritable bowel syndrome with constipation: Secondary | ICD-10-CM

## 2019-10-27 DIAGNOSIS — E785 Hyperlipidemia, unspecified: Secondary | ICD-10-CM

## 2019-10-27 NOTE — Patient Instructions (Addendum)
October 27, 2019  Dear Kaylee [name],  It was a pleasure meeting you during our initial appointment on October 27, 2019. Below is a summary of the goals we discussed and components of chronic care management. Please contact me anytime with questions or concerns.   Visit Information  Goals Addressed            This Visit's Progress   . Pharmacy Care Plan       CARE PLAN ENTRY  Current Barriers:  . Chronic Disease Management support, education, and care coordination needs related to COPD, hyperlipidemia  Pharmacist Clinical Goal(s):  [name] Over the next 6 months, patient will work with PharmD and primary care provider to address the following goals: o Cholesterol: Maintain LDL cholesterol within goal of less than 70 mg/dL o COPD: Improve shortness of breath and reduce albuterol use. Recommend taking Advair twice daily to improve shortness of breath. o Vaccinations: Remain up to date on vaccines. Recommend 2-dose series of Shingrix vaccine. Check price at local pharmacy.  Interventions: . Comprehensive medication review performed  Patient Self Care Activities:  For the next 6 months until follow up visit:  . Adhere to heart healthy diet such as the DASH diet  . Work towards increasing exercise with goal of walking 10 minutes three times a day   Initial goal documentation       Ms. Slates was given information about Chronic Care Management services today including:  1. CCM service includes personalized support from designated clinical staff supervised by her physician, including individualized plan of care and coordination with other care providers 2. 24/7 contact phone numbers for assistance for urgent and routine care needs. 3. Standard insurance, coinsurance, copays and deductibles apply for chronic care management only during months in which we provide at least 20 minutes of these services. Most insurances cover these services at 100%, however patients may be responsible for  any copay, coinsurance and/or deductible if applicable. This service may help you avoid the need for more expensive face-to-face services. 4. Only one practitioner may furnish and bill the service in a calendar month. 5. The patient may stop CCM services at any time (effective at the end of the month) by phone call to the office staff.  Patient agreed to services and verbal consent obtained.   The patient verbalized understanding of instructions provided today and agreed to receive a mailed copy of patient instruction and/or educational materials. Telephone follow up appointment with pharmacy team member scheduled for: 04/23/20 at 10:00 AM (telephone)  Phil Dopp, PharmD Clinical Pharmacist [facility] [phone] DASH Eating Plan DASH stands for "Dietary Approaches to Stop Hypertension." The DASH eating plan is a healthy eating plan that has been shown to reduce high blood pressure (hypertension). It may also reduce your risk for type 2 diabetes, heart disease, and stroke. The DASH eating plan may also help with weight loss. What are tips for following this plan?  General guidelines  Avoid eating more than 2,300 mg (milligrams) of salt (sodium) a day. If you have hypertension, you may need to reduce your sodium intake to 1,500 mg a day.  Limit alcohol intake to no more than 1 drink a day for nonpregnant women and 2 drinks a day for men. One drink equals 12 oz of beer, 5 oz of wine, or 1 oz of hard liquor.  Work with your health care provider to maintain a healthy body weight or to lose weight. Ask what an ideal  weight is for you.  Get at least 30 minutes of exercise that causes your heart to beat faster (aerobic exercise) most days of the week. Activities may include walking, swimming, or biking.  Work with your health care provider or diet and nutrition specialist (dietitian) to adjust your eating plan to your individual calorie needs. Reading food  labels   Check food labels for the amount of sodium per serving. Choose foods with less than 5 percent of the Daily Value of sodium. Generally, foods with less than 300 mg of sodium per serving fit into this eating plan.  To find whole grains, look for the word "whole" as the first word in the ingredient list. Shopping  Buy products labeled as "low-sodium" or "no salt added."  Buy fresh foods. Avoid canned foods and premade or frozen meals. Cooking  Avoid adding salt when cooking. Use salt-free seasonings or herbs instead of table salt or sea salt. Check with your health care provider or pharmacist before using salt substitutes.  Do not fry foods. Cook foods using healthy methods such as baking, boiling, grilling, and broiling instead.  Cook with heart-healthy oils, such as olive, canola, soybean, or sunflower oil. Meal planning  Eat a balanced diet that includes: ? 5 or more servings of fruits and vegetables each day. At each meal, try to fill half of your plate with fruits and vegetables. ? Up to 6-8 servings of whole grains each day. ? Less than 6 oz of lean meat, poultry, or fish each day. A 3-oz serving of meat is about the same size as a deck of cards. One egg equals 1 oz. ? 2 servings of low-fat dairy each day. ? A serving of nuts, seeds, or beans 5 times each week. ? Heart-healthy fats. Healthy fats called Omega-3 fatty acids are found in foods such as flaxseeds and coldwater fish, like sardines, salmon, and mackerel.  Limit how much you eat of the following: ? Canned or prepackaged foods. ? Food that is high in trans fat, such as fried foods. ? Food that is high in saturated fat, such as fatty meat. ? Sweets, desserts, sugary drinks, and other foods with added sugar. ? Full-fat dairy products.  Do not salt foods before eating.  Try to eat at least 2 vegetarian meals each week.  Eat more home-cooked food and less restaurant, buffet, and fast food.  When eating at a  restaurant, ask that your food be prepared with less salt or no salt, if possible. What foods are recommended? The items listed may not be a complete list. Talk with your dietitian about what dietary choices are best for you. Grains Whole-grain or whole-wheat bread. Whole-grain or whole-wheat pasta. Brown rice. Modena Morrow. Bulgur. Whole-grain and low-sodium cereals. Pita bread. Low-fat, low-sodium crackers. Whole-wheat flour tortillas. Vegetables Fresh or frozen vegetables (raw, steamed, roasted, or grilled). Low-sodium or reduced-sodium tomato and vegetable juice. Low-sodium or reduced-sodium tomato sauce and tomato paste. Low-sodium or reduced-sodium canned vegetables. Fruits All fresh, dried, or frozen fruit. Canned fruit in natural juice (without added sugar). Meat and other protein foods Skinless chicken or [place]. Ground chicken or [place]. Pork with fat trimmed off. Fish and seafood. Egg whites. Dried beans, peas, or lentils. Unsalted nuts, nut butters, and seeds. Unsalted canned beans. Lean cuts of beef with fat trimmed off. Low-sodium, lean deli meat. Dairy Low-fat (1%) or fat-free (skim) milk. Fat-free, low-fat, or reduced-fat cheeses. Nonfat, low-sodium ricotta or cottage cheese. Low-fat or nonfat yogurt. Low-fat, low-sodium cheese. Fats  and oils Soft margarine without trans fats. Vegetable oil. Low-fat, reduced-fat, or light mayonnaise and salad dressings (reduced-sodium). Canola, safflower, olive, soybean, and sunflower oils. Avocado. Seasoning and other foods Herbs. Spices. Seasoning mixes without salt. Unsalted popcorn and pretzels. Fat-free sweets. What foods are not recommended? The items listed may not be a complete list. Talk with your dietitian about what dietary choices are best for you. Grains Baked goods made with fat, such as croissants, muffins, or some breads. Dry pasta or rice meal packs. Vegetables Creamed or fried vegetables. Vegetables in a cheese sauce.  Regular canned vegetables (not low-sodium or reduced-sodium). Regular canned tomato sauce and paste (not low-sodium or reduced-sodium). Regular tomato and vegetable juice (not low-sodium or reduced-sodium). Rosita Fire. Olives. Fruits Canned fruit in a light or heavy syrup. Fried fruit. Fruit in cream or butter sauce. Meat and other protein foods Fatty cuts of meat. Ribs. Fried meat. Tomasa Blase. Sausage. Bologna and other processed lunch meats. Salami. Fatback. Hotdogs. Bratwurst. Salted nuts and seeds. Canned beans with added salt. Canned or smoked fish. Whole eggs or egg yolks. Chicken or [place] with skin. Dairy Whole or 2% milk, cream, and half-and-half. Whole or full-fat cream cheese. Whole-fat or sweetened yogurt. Full-fat cheese. Nondairy creamers. Whipped toppings. Processed cheese and cheese spreads. Fats and oils Butter. Stick margarine. Lard. Shortening. Ghee. Bacon fat. Tropical oils, such as coconut, palm kernel, or palm oil. Seasoning and other foods Salted popcorn and pretzels. Onion salt, garlic salt, seasoned salt, table salt, and sea salt. Worcestershire sauce. Tartar sauce. Barbecue sauce. Teriyaki sauce. Soy sauce, including reduced-sodium. Steak sauce. Canned and packaged gravies. Fish sauce. Oyster sauce. Cocktail sauce. Horseradish that you find on the shelf. Ketchup. Mustard. Meat flavorings and tenderizers. Bouillon cubes. Hot sauce and Tabasco sauce. Premade or packaged marinades. Premade or packaged taco seasonings. Relishes. Regular salad dressings. Where to find more information:  National Heart, Lung, and Blood Institute: PopSteam.is  American Heart Association: www.heart.org Summary  The DASH eating plan is a healthy eating plan that has been shown to reduce high blood pressure (hypertension). It may also reduce your risk for type 2 diabetes, heart disease, and stroke.  With the DASH eating plan, you should limit salt (sodium) intake to 2,300 mg a day. If you have  hypertension, you may need to reduce your sodium intake to 1,500 mg a day.  When on the DASH eating plan, aim to eat more fresh fruits and vegetables, whole grains, lean proteins, low-fat dairy, and heart-healthy fats.  Work with your health care provider or diet and nutrition specialist (dietitian) to adjust your eating plan to your individual calorie needs. This information is not intended to replace advice given to you by your health care provider. Make sure you discuss any questions you have with your health care provider. Document Revised: 06/11/2017 Document Reviewed: 06/22/2016 Elsevier Patient Education  2020 [employer].
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
## 2019-10-30 ENCOUNTER — Other Ambulatory Visit: Payer: Self-pay | Admitting: Family Medicine

## 2019-10-30 DIAGNOSIS — R059 Cough, unspecified: Secondary | ICD-10-CM

## 2019-10-30 DIAGNOSIS — R05 Cough: Secondary | ICD-10-CM

## 2019-10-30 IMAGING — CT CT CHEST LUNG CANCER SCREENING LOW DOSE W/O CM
2 of 5 series · 15 of 40 positions shown, 18 images · non-contrast
Comparison: 08/28/2015.

CLINICAL DATA: Former smoker, quit 10 years ago, 30 pack-year
history.

EXAM:
CT CHEST WITHOUT CONTRAST LOW-DOSE FOR LUNG CANCER SCREENING
TECHNIQUE: Multidetector CT imaging of the chest was performed following the
standard protocol without IV contrast.

[Series 3: lung · axial · 0.66mm/px · z∈[-1130,-883]mm · 12 of 274 slices shown, 15 images (1 of 2)]
[im 14/274  mediastinal]
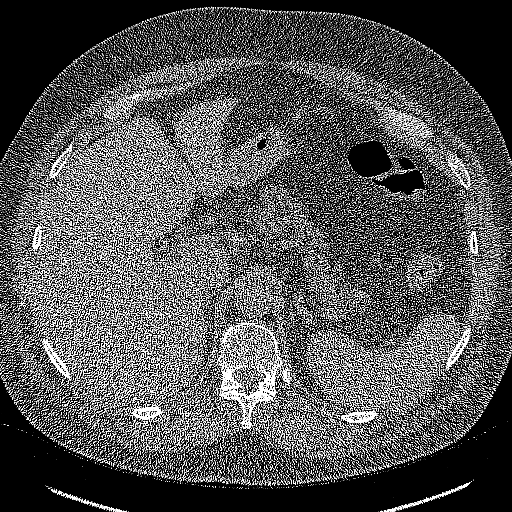
[im 14/274  lung]
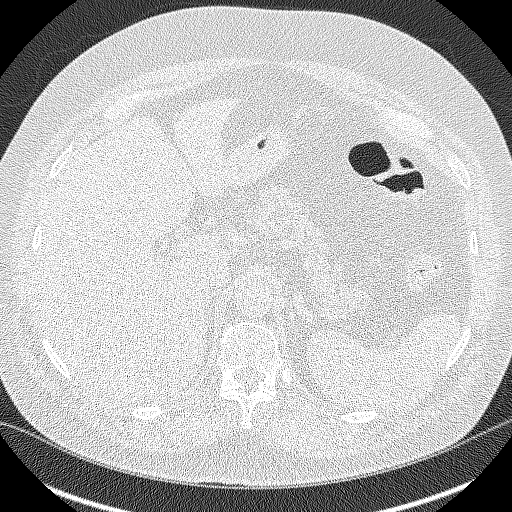
[im 40/274  lung]
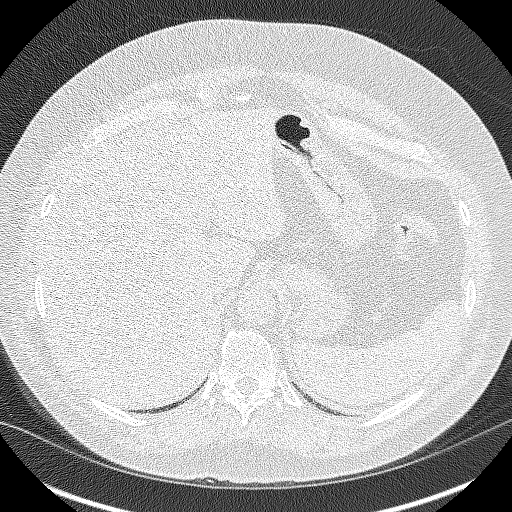
[im 66/274  lung]
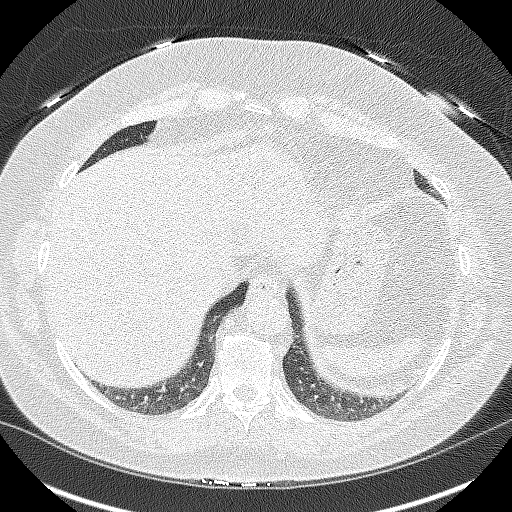
[im 79/274  lung]
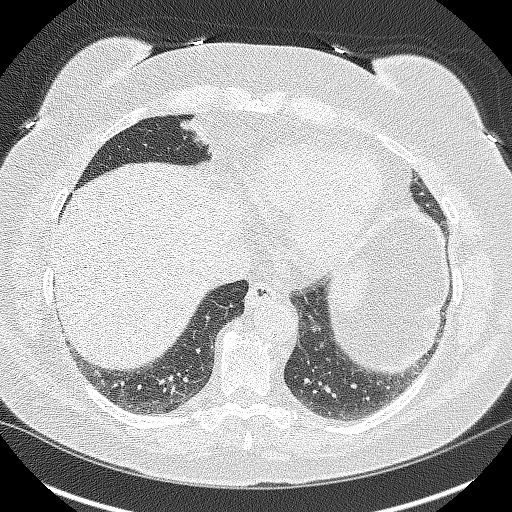
[im 105/274  mediastinal]
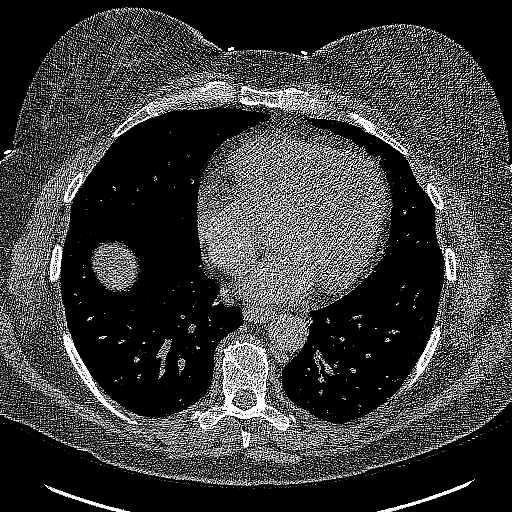
[im 105/274  lung]
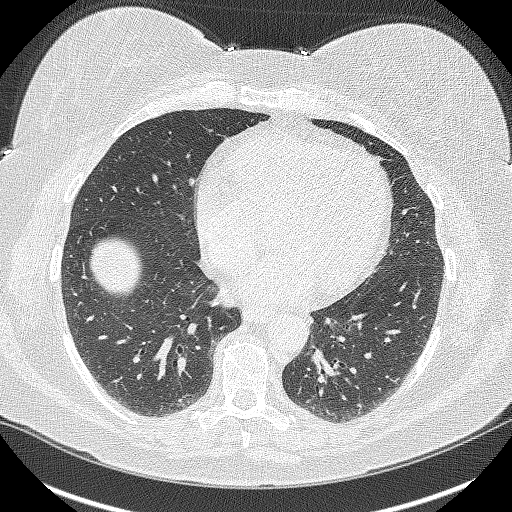
[im 131/274  lung]
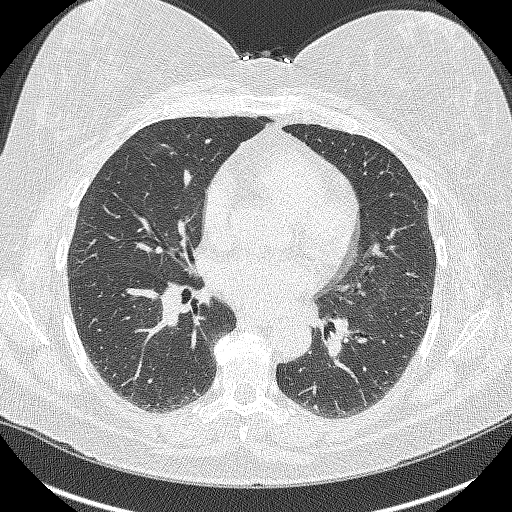
[im 144/274  lung]
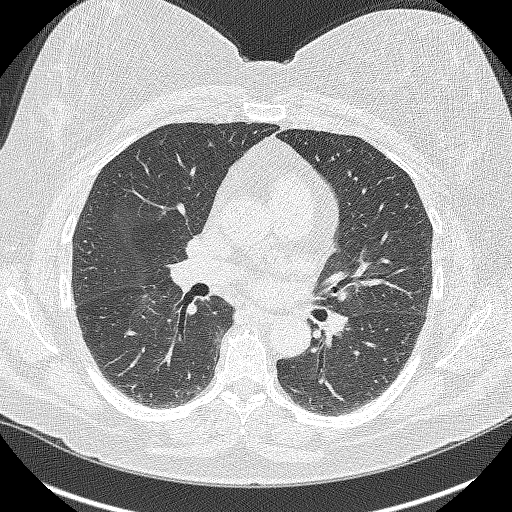
[im 170/274  lung]
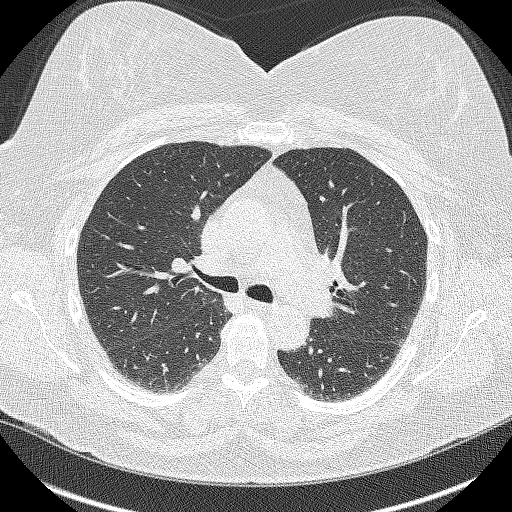
[im 196/274  mediastinal]
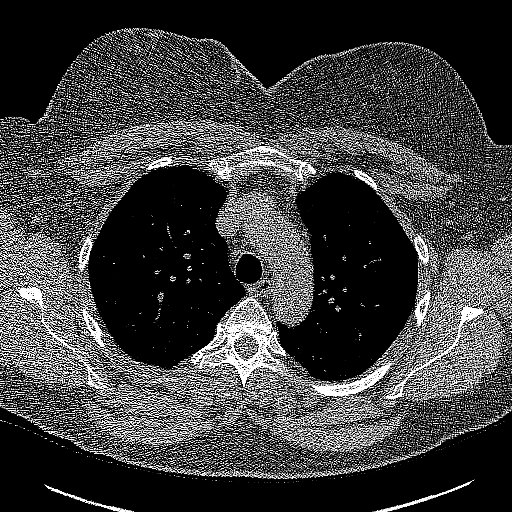
[im 196/274  lung]
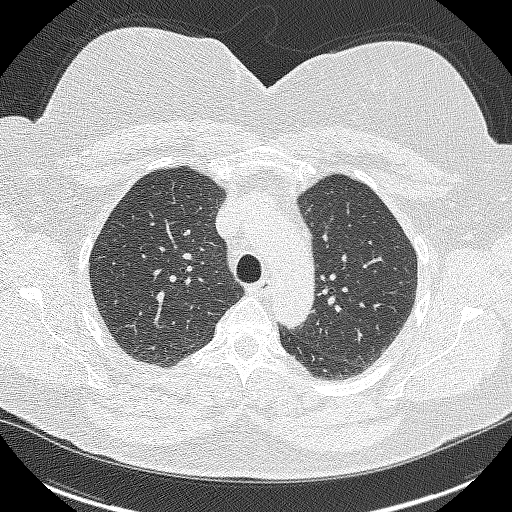
[im 209/274  lung]
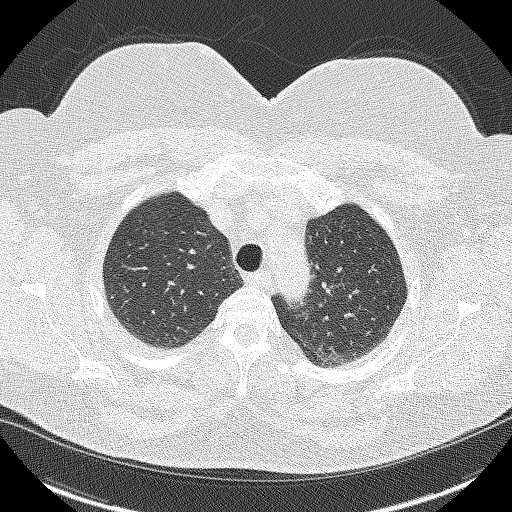
[im 235/274  lung]
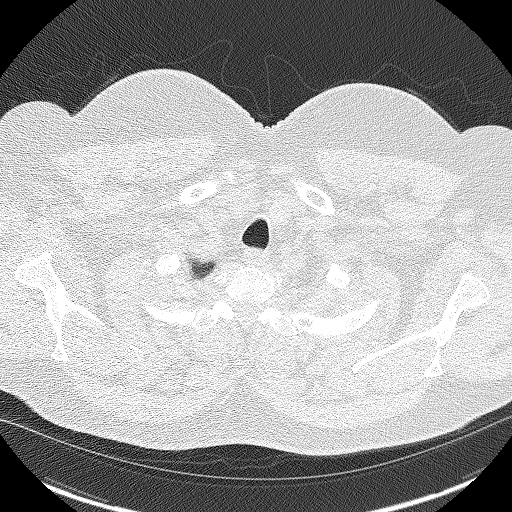
[im 261/274  lung]
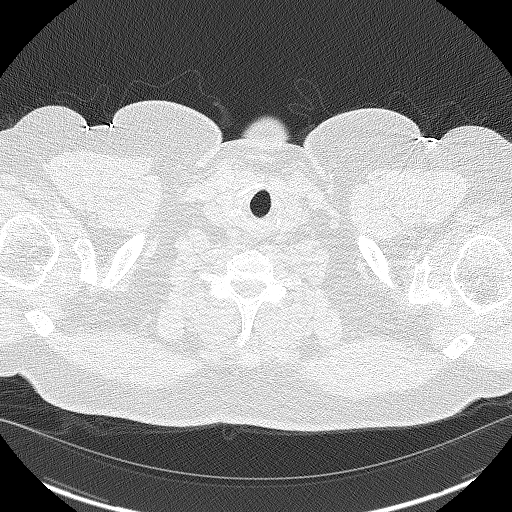

[Series 4: lung · coronal · 0.54mm/px · 3 of 299 slices shown (2 of 2)]
[im 60/299  lung]
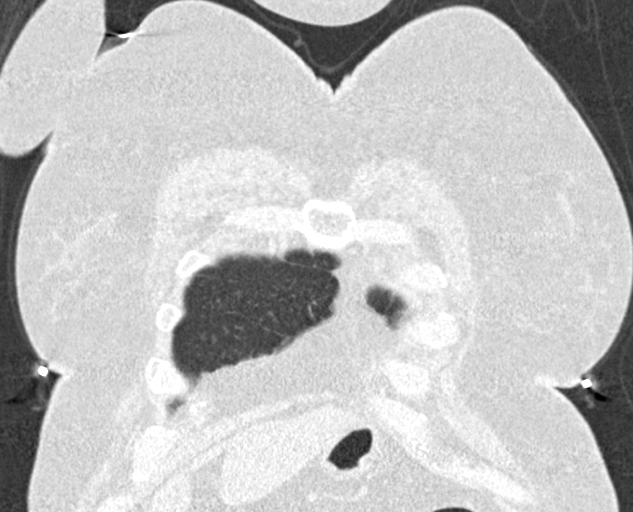
[im 120/299  lung]
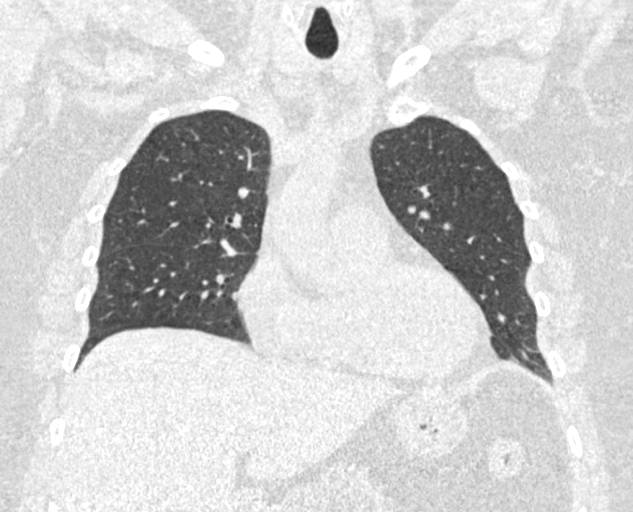
[im 179/299  lung]
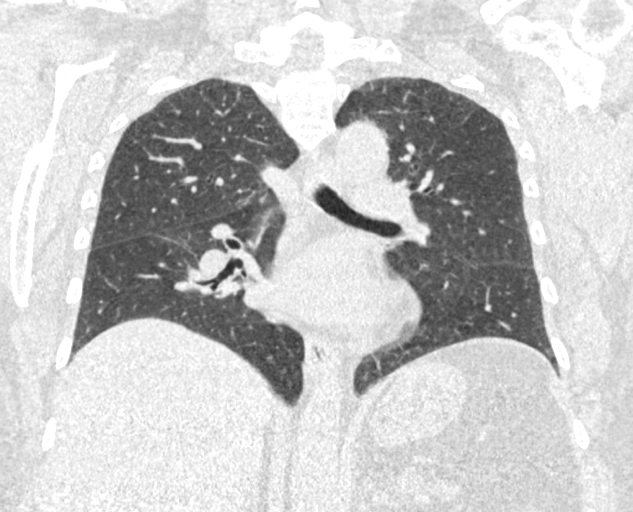

[15 of 40 positions shown; findings below may reference images not displayed]

FINDINGS: Cardiovascular: Vascular structures are unremarkable. Heart size
normal. No pericardial effusion.

Mediastinum/Nodes: No pathologically enlarged mediastinal or
axillary lymph nodes. Hilar regions are difficult to evaluate
without IV contrast. Esophagus is unremarkable.

Lungs/Pleura: Mild centrilobular emphysema. 3 mm subpleural right
upper lobe nodule. No worrisome pulmonary nodules. No pleural fluid.
Airway is unremarkable.

Upper Abdomen: Low-attenuation lesions in the liver measure up to
2.3 cm and are likely cysts. Visualized portions of the liver,
adrenal glands, spleen, pancreas, stomach and bowel are
unremarkable. No upper abdominal adenopathy.

Musculoskeletal: Degenerative changes in the spine.
IMPRESSION: 1. Lung-RADS 2, benign appearance or behavior. Continue annual
screening with low-dose chest CT without contrast in 12 months.
2.  Emphysema ([id number]).

## 2019-10-30 NOTE — Telephone Encounter (Signed)
Name of Medication: Alprazolam, Diazepam, Oxycodone-APAP Name of Pharmacy: [facility] Last [place] or Written Date and Quantity: 10/06/19      Alprazolam- #10      Diazepam- #60      Oxycodone-APAP- #90 Last Office Visit and Type: 10-09-19, chronic pain mngmt Next Office Visit and Type: 01-11-20, chronic pain mngmt Last Controlled Substance Agreement Date: 02/17/19 Last UDS: 10-09-19

## 2019-11-01 NOTE — Telephone Encounter (Signed)
ERx 

## 2019-11-08 ENCOUNTER — Other Ambulatory Visit: Payer: Self-pay | Admitting: Family Medicine

## 2019-11-09 ENCOUNTER — Other Ambulatory Visit: Payer: Self-pay | Admitting: Family Medicine

## 2019-11-27 ENCOUNTER — Other Ambulatory Visit: Payer: Self-pay | Admitting: Family Medicine

## 2019-11-28 NOTE — Telephone Encounter (Signed)
Plz address in Dr. Timoteo Expose absence.  Error message says I do not have authority to deny controlled med.  Looks like it's too early if they were filled on 11/08/19.   Name of Medication: Alprazolam, Diazepam, Oxycodone-APAP Name of Pharmacy: [facility] Last [place] or Written Date and Quantity: 11/08/19      Alprazolam- #10      Diazepam- #60      Oxycodone-APAP- #90 Last Office Visit and Type: 10/09/19, 3 mo chronic pain mngmt Next Office Visit and Type: 01/11/20, 3 mo chronic pain mngmt Last Controlled Substance Agreement Date: 02/17/19 Last UDS: 02/17/19   Macrodantin last filled:  08/14/19, #90 Robaxin last filled:  11/08/19, #60

## 2019-12-04 ENCOUNTER — Other Ambulatory Visit: Payer: Self-pay | Admitting: Family Medicine

## 2019-12-04 NOTE — Telephone Encounter (Signed)
plz notify ERx 

## 2019-12-04 NOTE — Telephone Encounter (Signed)
Pt scheduled an appt with Dr Reece Agar for medication management and refills.  Pt had to cancel her 5/25 appt d/t having to take her husband to the hospital for brain surgery and she has a lot of stuff that she is taking care of this week. Pt had to push her appt to June 3 . Requesting that we at least refill up until 12/14/19  Please advise, thanks.   Pt requesting a call back once Rx are sent.

## 2019-12-05 ENCOUNTER — Telehealth: Payer: Medicare Other

## 2019-12-05 ENCOUNTER — Ambulatory Visit: Payer: Medicare Other | Admitting: Family Medicine

## 2019-12-05 NOTE — Telephone Encounter (Signed)
Left message on vm per dpr notifying pt refills were sent in.   

## 2019-12-14 ENCOUNTER — Ambulatory Visit: Payer: Medicare Other | Admitting: Family Medicine

## 2019-12-20 ENCOUNTER — Ambulatory Visit: Payer: Medicare Other | Admitting: Family Medicine

## 2019-12-22 ENCOUNTER — Other Ambulatory Visit: Payer: Self-pay | Admitting: Family Medicine

## 2019-12-27 ENCOUNTER — Encounter: Payer: Self-pay | Admitting: Family Medicine

## 2019-12-27 ENCOUNTER — Other Ambulatory Visit: Payer: Self-pay

## 2019-12-27 ENCOUNTER — Ambulatory Visit (INDEPENDENT_AMBULATORY_CARE_PROVIDER_SITE_OTHER): Payer: Medicare Other | Admitting: Family Medicine

## 2019-12-27 VITALS — BP 128/66 | HR 96 | Temp 97.9°F | Ht 65.0 in | Wt 233.1 lb

## 2019-12-27 DIAGNOSIS — N3020 Other chronic cystitis without hematuria: Secondary | ICD-10-CM | POA: Diagnosis not present

## 2019-12-27 DIAGNOSIS — Z789 Other specified health status: Secondary | ICD-10-CM

## 2019-12-27 DIAGNOSIS — J309 Allergic rhinitis, unspecified: Secondary | ICD-10-CM | POA: Diagnosis not present

## 2019-12-27 DIAGNOSIS — J432 Centrilobular emphysema: Secondary | ICD-10-CM

## 2019-12-27 DIAGNOSIS — G894 Chronic pain syndrome: Secondary | ICD-10-CM | POA: Diagnosis not present

## 2019-12-27 DIAGNOSIS — Z8619 Personal history of other infectious and parasitic diseases: Secondary | ICD-10-CM

## 2019-12-27 DIAGNOSIS — M1990 Unspecified osteoarthritis, unspecified site: Secondary | ICD-10-CM

## 2019-12-27 DIAGNOSIS — Z7289 Other problems related to lifestyle: Secondary | ICD-10-CM

## 2019-12-27 DIAGNOSIS — G8929 Other chronic pain: Secondary | ICD-10-CM

## 2019-12-27 DIAGNOSIS — E785 Hyperlipidemia, unspecified: Secondary | ICD-10-CM | POA: Diagnosis not present

## 2019-12-27 DIAGNOSIS — M0600 Rheumatoid arthritis without rheumatoid factor, unspecified site: Secondary | ICD-10-CM

## 2019-12-27 DIAGNOSIS — M5432 Sciatica, left side: Secondary | ICD-10-CM

## 2019-12-27 DIAGNOSIS — F319 Bipolar disorder, unspecified: Secondary | ICD-10-CM

## 2019-12-27 MED ORDER — ADVAIR DISKUS 250-50 MCG/DOSE IN AEPB: 1.0000 | INHALATION_SPRAY | Freq: Two times a day (BID) | RESPIRATORY_TRACT | 6 refills | Status: DC

## 2019-12-27 MED ORDER — ALPHA-LIPOIC ACID 600 MG PO CAPS: 1.0000 | ORAL_CAPSULE | Freq: Every day | ORAL | 11 refills | Status: DC

## 2019-12-27 MED ORDER — NITROFURANTOIN MACROCRYSTAL 100 MG PO CAPS: 100.0000 mg | ORAL_CAPSULE | Freq: Every day | ORAL | 3 refills | Status: DC

## 2019-12-27 MED ORDER — ARIPIPRAZOLE 5 MG PO TABS: 5.0000 mg | ORAL_TABLET | Freq: Every day | ORAL | 11 refills | Status: DC

## 2019-12-27 MED ORDER — TIZANIDINE HCL 4 MG PO TABS: 4.0000 mg | ORAL_TABLET | Freq: Two times a day (BID) | ORAL | 3 refills | Status: DC | PRN

## 2019-12-27 MED ORDER — NEOMYCIN-POLYMYXIN-HC 1 % OT SOLN: OTIC | 0 refills | Status: DC

## 2019-12-27 MED ORDER — ACYCLOVIR 400 MG PO TABS: 400.0000 mg | ORAL_TABLET | Freq: Two times a day (BID) | ORAL | 6 refills | Status: DC

## 2019-12-27 MED ORDER — ETODOLAC 400 MG PO TABS: 400.0000 mg | ORAL_TABLET | Freq: Two times a day (BID) | ORAL | 11 refills | Status: DC | PRN

## 2019-12-27 MED ORDER — PROMETHAZINE HCL 12.5 MG PO TABS: 12.5000 mg | ORAL_TABLET | Freq: Three times a day (TID) | ORAL | 0 refills | Status: DC | PRN

## 2019-12-27 MED ORDER — POTASSIUM CHLORIDE ER 10 MEQ PO TBCR: 10.0000 meq | EXTENDED_RELEASE_TABLET | Freq: Every day | ORAL | 3 refills | Status: DC

## 2019-12-27 MED ORDER — NYSTATIN 100000 UNIT/GM EX CREA: 1.0000 "application " | TOPICAL_CREAM | Freq: Two times a day (BID) | CUTANEOUS | 6 refills | Status: DC

## 2019-12-27 MED ORDER — POLYMYXIN B-TRIMETHOPRIM 10000-0.1 UNIT/ML-% OP SOLN: 1.0000 [drp] | Freq: Four times a day (QID) | OPHTHALMIC | 0 refills | Status: DC

## 2019-12-27 MED ORDER — FLUTICASONE PROPIONATE 50 MCG/ACT NA SUSP: 2.0000 | Freq: Every day | NASAL | 11 refills | Status: DC

## 2019-12-27 MED ORDER — OMEGA-3-ACID ETHYL ESTERS 1 G PO CAPS: 2.0000 g | ORAL_CAPSULE | Freq: Every day | ORAL | 3 refills | Status: DC

## 2019-12-27 MED ORDER — COLCHICINE 0.6 MG PO TABS: 0.6000 mg | ORAL_TABLET | Freq: Every day | ORAL | 3 refills | Status: DC | PRN

## 2019-12-27 NOTE — Assessment & Plan Note (Signed)
Stable period on abilify 5mg  + celexa 10mg  daily.

## 2019-12-27 NOTE — Patient Instructions (Addendum)
Cancel next month's appointment, schedule physical and wellness visit in 3 months.  Continue current medicines - refilled today.

## 2019-12-27 NOTE — Assessment & Plan Note (Addendum)
Successfully managed with nitrofurantoin daily ppx.  Reviewed risk of lung fibrosis with longterm use.

## 2019-12-27 NOTE — Assessment & Plan Note (Signed)
Continue flonase use. Discussed afrin and nasal steroid use.

## 2019-12-27 NOTE — Assessment & Plan Note (Signed)
Rx acyclovir 400mg  BID - discussed trial QD suppressive dosing, update with effect.
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
## 2019-12-27 NOTE — Assessment & Plan Note (Addendum)
[place] CSRS reviewed. Continue narcotics through our office as well as interventional approach through pain management in [place] (Dr Eustaquio Maize)
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
## 2019-12-27 NOTE — Assessment & Plan Note (Signed)
H/o emphysema by CT. Stable period on spiriva + advair with PRN albuterol.

## 2019-12-27 NOTE — Assessment & Plan Note (Signed)
Continue atorvastatin and lovaza. The ASCVD Risk score Denman George DC Jr., et al., 2013) failed to calculate for the following reasons:   The patient has a prior MI or stroke diagnosis

## 2019-12-27 NOTE — Assessment & Plan Note (Signed)
Inflammatory arthritis managed with allopurinol and colchicine. No true gout diagnosis but she finds these meds are most effective. She stopped plaquenil and declined rheum return 2019

## 2019-12-27 NOTE — Assessment & Plan Note (Signed)
Appreciate pain clinic care [facility] neurosurgery clinic at [facility] [address] Eustaquio Maize). Seeing regularly for interventional approaches.

## 2020-01-03 ENCOUNTER — Other Ambulatory Visit: Payer: Self-pay | Admitting: Family Medicine

## 2020-01-04 ENCOUNTER — Other Ambulatory Visit: Payer: Self-pay | Admitting: Family Medicine

## 2020-01-04 NOTE — Telephone Encounter (Signed)
ERx 

## 2020-01-05 NOTE — Telephone Encounter (Signed)
Refills already sent in.  I'm not able to deny request.

## 2020-01-11 ENCOUNTER — Ambulatory Visit: Payer: Medicare Other | Admitting: Family Medicine

## 2020-01-29 ENCOUNTER — Other Ambulatory Visit: Payer: Self-pay | Admitting: Family Medicine

## 2020-01-29 NOTE — Telephone Encounter (Signed)
Name of Medication: Alprazolam, Diazepam, Linzess, Oxycodone-APAP Name of Pharmacy: [facility] Last [place] or Written Date and Quantity: 01/04/20      Alprazolam- #10      Diazepam- #60      Linzess- #30      Oxycodone-APAP-# 90 Last Office Visit and Type: 12/27/19, chronic pain mngmt Next Office Visit and Type: 04/01/20, AWV prt 2 Last Controlled Substance Agreement Date: 02/17/19 Last UDS: 02/17/19

## 2020-01-30 NOTE — Telephone Encounter (Signed)
ERx 

## 2020-02-27 ENCOUNTER — Telehealth: Payer: Self-pay | Admitting: *Deleted

## 2020-02-27 NOTE — Telephone Encounter (Signed)
(  02/27/2020) Unable to leave message for pt to notify them that it is time to schedule annual low dose lung cancer screening CT scan. Will call back to verify information prior to the scan being scheduled SRW     

## 2020-02-29 ENCOUNTER — Other Ambulatory Visit: Payer: Self-pay | Admitting: Family Medicine

## 2020-02-29 NOTE — Telephone Encounter (Signed)
Name of Medication: Alprazolam, Diazepam, Oxycodone-APAP Name of Pharmacy: [facility] Last [place] or Written Date and Quantity: 02/01/20      Alprazolam- #10      Diazepam- #60      Oxycodone-APAP- #90 Last Office Visit and Type: 12/27/19, f/u Next Office Visit and Type: 04/01/20, AWV prt 2 Last Controlled Substance Agreement Date: 02/17/19 Last UDS: 10/09/19

## 2020-02-29 NOTE — Telephone Encounter (Signed)
ERx 

## 2020-03-09 ENCOUNTER — Telehealth: Payer: Self-pay | Admitting: *Deleted

## 2020-03-09 NOTE — Telephone Encounter (Signed)
Pt notified that lung cancer screening imaging is due currently or in the near future. She is a former smoker. Quit smoking cigarettes in 2010. Appointment made for 04/18/2020 at 11:00 am.

## 2020-03-11 ENCOUNTER — Telehealth: Payer: Self-pay | Admitting: *Deleted

## 2020-03-11 DIAGNOSIS — Z122 Encounter for screening for malignant neoplasm of respiratory organs: Secondary | ICD-10-CM

## 2020-03-11 DIAGNOSIS — Z87891 Personal history of nicotine dependence: Secondary | ICD-10-CM

## 2020-03-11 NOTE — Telephone Encounter (Signed)
Pt called in to ask if her yearly CT scan can be moved to Sept since she will be out of town the month of Oct. LDCT scan rescheduled to 9/16 at 4pm at [facility]. Pt made aware and verbalized understanding.

## 2020-03-16 ENCOUNTER — Telehealth: Payer: Self-pay | Admitting: *Deleted

## 2020-03-25 ENCOUNTER — Telehealth: Payer: Self-pay | Admitting: Family Medicine

## 2020-03-25 ENCOUNTER — Other Ambulatory Visit: Payer: Self-pay | Admitting: Family Medicine

## 2020-03-25 NOTE — Telephone Encounter (Signed)
Pt called needing to r/s her cpx.  She r/s to 11/5.  Pt wanted to know if she could be worked in , in [place]  She stated this is for med refill  Please advise

## 2020-03-26 ENCOUNTER — Other Ambulatory Visit: Payer: Self-pay | Admitting: *Deleted

## 2020-03-26 DIAGNOSIS — Z122 Encounter for screening for malignant neoplasm of respiratory organs: Secondary | ICD-10-CM

## 2020-03-26 DIAGNOSIS — Z87891 Personal history of nicotine dependence: Secondary | ICD-10-CM

## 2020-03-26 NOTE — Progress Notes (Signed)
Former smoker, quit 07/13/08, 30 pack year

## 2020-03-26 NOTE — Telephone Encounter (Signed)
Tried calliing pt mail box full

## 2020-03-26 NOTE — Telephone Encounter (Signed)
Ok to place in open 30 min slot in October, prefer am appt.

## 2020-03-27 ENCOUNTER — Ambulatory Visit: Payer: Medicare Other

## 2020-03-27 ENCOUNTER — Telehealth: Payer: Self-pay

## 2020-03-27 ENCOUNTER — Other Ambulatory Visit: Payer: Self-pay

## 2020-03-27 NOTE — Telephone Encounter (Signed)
Called patient 2 times trying to complete AWV. Patient never answered. It is noted in the comment section of her chart that she declined AWVs and please do not call again. Appointment was cancelled.

## 2020-03-27 NOTE — Telephone Encounter (Signed)
Mailbox full could not leave message 

## 2020-03-27 NOTE — Telephone Encounter (Signed)
Name of Medication: Alprazolam, Diazepam, Oxycodone-APAP Name of Pharmacy: [facility] Last [place] or Written Date and Quantity: 03/01/20      Alprazolam- #10      Diazepam- #60      Oxycodone-APAP- #90 Last Office Visit and Type: 12/27/19, chronic pain f/u Next Office Visit and Type: 05/14/20, AWV prt 2 Last Controlled Substance Agreement Date: 02/17/19 Last UDS: 10/09/19

## 2020-03-28 ENCOUNTER — Ambulatory Visit: Admission: RE | Admit: 2020-03-28 | Payer: Medicare Other | Source: Ambulatory Visit

## 2020-03-28 NOTE — Telephone Encounter (Signed)
ERx 

## 2020-03-29 NOTE — Telephone Encounter (Signed)
Mailbox full could not leave message 

## 2020-03-30 ENCOUNTER — Other Ambulatory Visit: Payer: Self-pay | Admitting: Family Medicine

## 2020-03-31 DIAGNOSIS — I252 Old myocardial infarction: Secondary | ICD-10-CM | POA: Diagnosis not present

## 2020-03-31 DIAGNOSIS — I129 Hypertensive chronic kidney disease with stage 1 through stage 4 chronic kidney disease, or unspecified chronic kidney disease: Secondary | ICD-10-CM | POA: Diagnosis not present

## 2020-03-31 DIAGNOSIS — L02811 Cutaneous abscess of head [any part, except face]: Secondary | ICD-10-CM | POA: Diagnosis not present

## 2020-03-31 DIAGNOSIS — L03811 Cellulitis of head [any part, except face]: Secondary | ICD-10-CM | POA: Diagnosis not present

## 2020-03-31 DIAGNOSIS — N189 Chronic kidney disease, unspecified: Secondary | ICD-10-CM | POA: Diagnosis not present

## 2020-03-31 DIAGNOSIS — J449 Chronic obstructive pulmonary disease, unspecified: Secondary | ICD-10-CM | POA: Diagnosis not present

## 2020-04-01 ENCOUNTER — Ambulatory Visit: Payer: Medicare Other | Admitting: Family Medicine

## 2020-04-03 ENCOUNTER — Telehealth: Payer: Self-pay | Admitting: Family Medicine

## 2020-04-03 NOTE — Telephone Encounter (Signed)
Pt called she went  to [facility] er 9/18 and needs a 10 day follow up with pcp.  The first available you have is 9/28 can pt be worked in with you or can she see someone else  Please advsie

## 2020-04-03 NOTE — Telephone Encounter (Signed)
appointment 10/18

## 2020-04-05 NOTE — Telephone Encounter (Signed)
May place in next open slot.

## 2020-04-08 NOTE — Telephone Encounter (Signed)
Attempted to contact pt.  No answer.  Vm box is full.  Need to see if she can come in tomorrow at 12:30.

## 2020-04-09 NOTE — Telephone Encounter (Signed)
Dr. Reece Agar already has a 12:30 pt today.  Plz schedule ER f/u in next available slot, per Dr. Reece Agar.

## 2020-04-09 NOTE — Telephone Encounter (Signed)
Attempted to contact pt.  No answer.  Vm box is full.  Need to see if she can come in today at 12:30.

## 2020-04-10 NOTE — Telephone Encounter (Signed)
[facility] Day - Client TELEPHONE ADVICE RECORD [facility] Patient Name: Kaylee [name] Gender: Female DOB: 08/08/1960 Age: 59 Y 5 M 2 D Return Phone Number: [phone] (Primary) Address: City/State/Zip: [place] Client [facility] Day - Client Client Site [name] - Day Physician Kaylee [name] - MD Contact Type Call Who Is Calling Patient / Member / Family / Caregiver Call Type Triage / Clinical Relationship To Patient Self Return Phone Number [phone] (Primary) Chief Complaint Neck Stiffness Reason for Call Symptomatic / Request for Health Information Initial Comment Caller states that she was seen at the ER 10 days ago, diagnosed with worms in her head and prescribed Keflex and told to follow up with her PCP. She does have an appt scheduled for friday but she is still having stiffness in her neck and pain in her neck when she turns her head. Caller states that she has pulled out about 1,000 worms from her head. Translation No Nurse Assessment Nurse: Kaylee Jarvis, RN, [place] Date/Time Kaylee [name] Time): 04/10/2020 10:24:43 AM Confirm and document reason for call. If symptomatic, describe symptoms. ---Caller states that she was seen at the ER 10 days ago, diagnosed with worms in her head and prescribed Keflex and told to follow up with her PCP. She does have an appt scheduled for Friday but she is still having stiffness in her neck and pain in her neck when she turns her head. Caller states that she has pulled out about 1,000 worms from her head. No fever. Does the patient have any new or worsening symptoms? ---Yes Will a triage be completed? ---Yes Related visit to physician within the last 2 weeks? ---Yes Does the PT have any chronic conditions? (i.e. diabetes, asthma, this includes High risk factors for pregnancy, etc.) ---Yes List chronic conditions. ---Asthma Emphysema COPD Is this a behavioral  health or substance abuse call? ---No Guidelines Guideline Title Affirmed Question Affirmed Notes Nurse Date/Time Kaylee [name] Time) Neck Pain or Stiffness [1] MODERATE neck pain (e.g., interferes with normal activities AND [2] present > 3 days Kaylee [name] 04/10/2020 10:28:28 AM PLEASE NOTE: All timestamps contained within this report are represented as [place] Standard Time. CONFIDENTIALTY NOTICE: This fax transmission is intended only for the addressee. It contains information that is legally privileged, confidential or otherwise protected from use or disclosure. If you are not the intended recipient, you are strictly prohibited from reviewing, disclosing, copying using or disseminating any of this information or taking any action in reliance on or regarding this information. If you have received this fax in error, please notify us immediately by telephone so that we can arrange for its return to [place]. Phone: [phone], Toll-Free: [phone], Fax: [phone] Page: 2 of 2 Call Id: [id number] Disp. Time Kaylee [name] Time) Disposition Final User 04/10/2020 10:34:05 AM SEE PCP WITHIN 3 DAYS Yes Kaylee Jarvis, RN, [place]veria Rising Disagree/Comply Comply Caller Understands Yes PreDisposition Call Doctor Care Advice Given Per Guideline SEE PCP WITHIN 3 DAYS: CALL BACK IF: * You become worse CARE ADVICE given per Neck Pain (Adult) guideline. Referrals REFERRED TO PCP OFFICE

## 2020-04-10 NOTE — Telephone Encounter (Signed)
Pt is already scheduled ED FU on 04/12/20 at 3 PM. FYI to Dr Reece Agar and Misty Stanley CMA.

## 2020-04-12 ENCOUNTER — Ambulatory Visit: Payer: Medicare Other | Admitting: Family Medicine
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
## 2020-04-17 ENCOUNTER — Telehealth: Payer: Self-pay | Admitting: *Deleted

## 2020-04-17 NOTE — Telephone Encounter (Signed)
Contacted in attempt to reschedule no show lung screening scan appt. Patient reports she is being treated for bot fly infection and will consider lung screening at a later date.

## 2020-04-22 ENCOUNTER — Ambulatory Visit: Payer: Medicare Other

## 2020-04-22 ENCOUNTER — Other Ambulatory Visit: Payer: Self-pay

## 2020-04-22 DIAGNOSIS — E782 Mixed hyperlipidemia: Secondary | ICD-10-CM

## 2020-04-22 DIAGNOSIS — J432 Centrilobular emphysema: Secondary | ICD-10-CM

## 2020-04-22 NOTE — Progress Notes (Signed)
I have collaborated with the care management provider regarding care management and care coordination activities outlined in this encounter and have reviewed this encounter including documentation in the note and care plan. I am certifying that I agree with the content of this note and encounter as supervising physician.  

## 2020-04-22 NOTE — Patient Instructions (Addendum)
Visit Information  Goals Addressed            This Visit's Progress   . Pharmacy Care Plan       CARE PLAN ENTRY  Current Barriers:  . Chronic Disease Management support, education, and care coordination needs related to COPD, hyperlipidemia  Pharmacist Clinical Goal(s):  [name] Over the next 6 months, patient will work with PharmD and primary care provider to address the following goals: o Cholesterol: Maintain LDL cholesterol within goal of less than 70 mg/dL o COPD: Improve shortness of breath and reduce albuterol use. Recommend taking Advair twice daily and Spriva daily to improve shortness of breath. o Vaccinations: Remain up to date on vaccines. Recommend completing second dose of COVID vaccine.  Interventions: . Comprehensive medication review performed  Patient Self Care Activities:  For the next 6 months until follow up visit:  . Adhere to heart healthy diet such as the DASH diet  . Work towards increasing exercise with goal of walking 10 minutes three times a day   Follow-up Goal documentation       The patient verbalized understanding of instructions provided today and declined a print copy of patient instruction materials.   Telephone follow up appointment with pharmacy team member scheduled for: 05/24/2020 @ 9 am   Juliane Lack, PharmD, [facility] Clinical Pharmacist [facility] [phone] (office) [phone] (mobile)   COPD and Physical Activity Chronic obstructive pulmonary disease (COPD) is a long-term (chronic) condition that affects the lungs. COPD is a general term that can be used to describe many different lung problems that cause lung swelling (inflammation) and limit airflow, including chronic bronchitis and emphysema. The main symptom of COPD is shortness of breath, which makes it harder to do even simple tasks. This can also make it harder to exercise and be active. Talk with your health care provider about treatments to help you breathe  better and actions you can take to prevent breathing problems during physical activity. What are the benefits of exercising with COPD? Exercising regularly is an important part of a healthy lifestyle. You can still exercise and do physical activities even though you have COPD. Exercise and physical activity improve your shortness of breath by increasing blood flow (circulation). This causes your heart to pump more oxygen through your body. Moderate exercise can improve your:  Oxygen use.  Energy level.  Shortness of breath.  Strength in your breathing muscles.  Heart health.  Sleep.  Self-esteem and feelings of self-worth.  Depression, stress, and anxiety levels. Exercise can benefit everyone with COPD. The severity of your disease may affect how hard you can exercise, especially at first, but everyone can benefit. Talk with your health care provider about how much exercise is safe for you, and which activities and exercises are safe for you. What actions can I take to prevent breathing problems during physical activity?  Sign up for a pulmonary rehabilitation program. This type of program may include: ? Education about lung diseases. ? Exercise classes that teach you how to exercise and be more active while improving your breathing. This usually involves:  Exercise using your lower extremities, such as a stationary bicycle.  About 30 minutes of exercise, 2 to 5 times per week, for 6 to 12 weeks  Strength training, such as push ups or leg lifts. ? Nutrition education. ? Group classes in which you can talk with others who also have COPD and learn ways to manage stress.  If you use an oxygen  tank, you should use it while you exercise. Work with your health care provider to adjust your oxygen for your physical activity. Your resting flow rate is different from your flow rate during physical activity.  While you are exercising: ? Take slow breaths. ? Pace yourself and do not try to  go too fast. ? Purse your lips while breathing out. Pursing your lips is similar to a kissing or whistling position. ? If doing exercise that uses a quick burst of effort, such as weight lifting:  Breathe in before starting the exercise.  Breathe out during the hardest part of the exercise (such as raising the weights). Where to find support You can find support for exercising with COPD from:  Your health care provider.  A pulmonary rehabilitation program.  Your local health department or community health programs.  Support groups, online or in-person. Your health care provider may be able to recommend support groups. Where to find more information You can find more information about exercising with COPD from:  American Lung Association: [place].  COPD Foundation: AlmostHot.gl. Contact a health care provider if:  Your symptoms get worse.  You have chest pain.  You have nausea.  You have a fever.  You have trouble talking or catching your breath.  You want to start a new exercise program or a new activity. Summary  COPD is a general term that can be used to describe many different lung problems that cause lung swelling (inflammation) and limit airflow. This includes chronic bronchitis and emphysema.  Exercise and physical activity improve your shortness of breath by increasing blood flow (circulation). This causes your heart to provide more oxygen to your body.  Contact your health care provider before starting any exercise program or new activity. Ask your health care provider what exercises and activities are safe for you. This information is not intended to replace advice given to you by your health care provider. Make sure you discuss any questions you have with your health care provider. Document Revised: 10/19/2018 Document Reviewed: 07/22/2017 Elsevier Patient Education  2020 [employer].

## 2020-04-23 ENCOUNTER — Telehealth: Payer: Medicare Other

## 2020-04-26 DIAGNOSIS — H25013 Cortical age-related cataract, bilateral: Secondary | ICD-10-CM | POA: Diagnosis not present

## 2020-04-27 ENCOUNTER — Other Ambulatory Visit: Payer: Self-pay | Admitting: Family Medicine

## 2020-04-27 NOTE — Telephone Encounter (Signed)
Name of Medication: Alprazolam, Diazepam, Oxycodone-APAP Name of Pharmacy: [facility] Last [place] or Written Date and Quantity: 9/16//21      Alprazolam- #10      Diazepam- #60      Oxycodone-APAP- #90 Last Office Visit and Type: 12/27/19, chronic pain f/u Next Office Visit and Type: 04/29/20 Last Controlled Substance Agreement Date: 02/17/19 Last UDS: 10/09/19

## 2020-04-29 ENCOUNTER — Ambulatory Visit: Payer: Medicare Other | Admitting: Family Medicine

## 2020-04-29 NOTE — Telephone Encounter (Signed)
ERx 

## 2020-05-14 ENCOUNTER — Telehealth: Payer: Self-pay | Admitting: *Deleted

## 2020-05-14 ENCOUNTER — Ambulatory Visit: Payer: Medicare Other | Admitting: Family Medicine
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
## 2020-05-15 ENCOUNTER — Ambulatory Visit (INDEPENDENT_AMBULATORY_CARE_PROVIDER_SITE_OTHER): Payer: Medicare Other | Admitting: Family Medicine

## 2020-05-15 ENCOUNTER — Encounter: Payer: Self-pay | Admitting: Family Medicine

## 2020-05-15 ENCOUNTER — Other Ambulatory Visit: Payer: Self-pay

## 2020-05-15 VITALS — BP 140/76 | HR 95 | Temp 97.8°F | Ht 65.0 in | Wt 230.4 lb

## 2020-05-15 DIAGNOSIS — L659 Nonscarring hair loss, unspecified: Secondary | ICD-10-CM

## 2020-05-15 DIAGNOSIS — Z599 Problem related to housing and economic circumstances, unspecified: Secondary | ICD-10-CM

## 2020-05-15 DIAGNOSIS — Z87891 Personal history of nicotine dependence: Secondary | ICD-10-CM

## 2020-05-15 DIAGNOSIS — E785 Hyperlipidemia, unspecified: Secondary | ICD-10-CM

## 2020-05-15 DIAGNOSIS — Z0000 Encounter for general adult medical examination without abnormal findings: Secondary | ICD-10-CM | POA: Diagnosis not present

## 2020-05-15 DIAGNOSIS — E782 Mixed hyperlipidemia: Secondary | ICD-10-CM

## 2020-05-15 DIAGNOSIS — J432 Centrilobular emphysema: Secondary | ICD-10-CM

## 2020-05-15 DIAGNOSIS — F319 Bipolar disorder, unspecified: Secondary | ICD-10-CM

## 2020-05-15 DIAGNOSIS — Z8619 Personal history of other infectious and parasitic diseases: Secondary | ICD-10-CM

## 2020-05-15 DIAGNOSIS — F411 Generalized anxiety disorder: Secondary | ICD-10-CM

## 2020-05-15 DIAGNOSIS — M1990 Unspecified osteoarthritis, unspecified site: Secondary | ICD-10-CM

## 2020-05-15 DIAGNOSIS — K5909 Other constipation: Secondary | ICD-10-CM

## 2020-05-15 DIAGNOSIS — G8929 Other chronic pain: Secondary | ICD-10-CM

## 2020-05-15 LAB — LIPID PANEL
Cholesterol: 167 mg/dL (ref 0–200)
HDL: 48.3 mg/dL (ref >39.00)
NonHDL: 118.93
Total CHOL/HDL Ratio: 3
Triglycerides: 245 mg/dL — ABNORMAL HIGH (ref 0.0–149.0)
VLDL: 49 mg/dL — ABNORMAL HIGH (ref 0.0–40.0)

## 2020-05-15 LAB — COMPREHENSIVE METABOLIC PANEL
ALT: 31 U/L (ref 0–35)
AST: 23 U/L (ref 0–37)
Albumin: 4 g/dL (ref 3.5–5.2)
Alkaline Phosphatase: 86 U/L (ref 39–117)
BUN: 14 mg/dL (ref 6–23)
CO2: 31 mEq/L (ref 19–32)
Calcium: 9 mg/dL (ref 8.4–10.5)
Chloride: 104 mEq/L (ref 96–112)
Creatinine, Ser: 0.86 mg/dL (ref 0.40–1.20)
GFR: 73.38 mL/min (ref >60.00)
Glucose, Bld: 96 mg/dL (ref 70–99)
Potassium: 3.9 mEq/L (ref 3.5–5.1)
Sodium: 141 mEq/L (ref 135–145)
Total Bilirubin: 0.7 mg/dL (ref 0.2–1.2)
Total Protein: 6.2 g/dL (ref 6.0–8.3)

## 2020-05-15 LAB — TSH: TSH: 1.11 u[IU]/mL (ref 0.35–4.50)

## 2020-05-15 LAB — CBC WITH DIFFERENTIAL/PLATELET
Basophils Absolute: 0 10*3/uL (ref 0.0–0.1)
Basophils Relative: 0.6 % (ref 0.0–3.0)
Eosinophils Absolute: 0.2 10*3/uL (ref 0.0–0.7)
Eosinophils Relative: 2.9 % (ref 0.0–5.0)
HCT: 39.9 % (ref 36.0–46.0)
Hemoglobin: 13.3 g/dL (ref 12.0–15.0)
Lymphocytes Relative: 43.3 % (ref 12.0–46.0)
Lymphs Abs: 3.1 10*3/uL (ref 0.7–4.0)
MCHC: 33.3 g/dL (ref 30.0–36.0)
MCV: 90.5 fl (ref 78.0–100.0)
Monocytes Absolute: 0.6 10*3/uL (ref 0.1–1.0)
Monocytes Relative: 7.9 % (ref 3.0–12.0)
Neutro Abs: 3.2 10*3/uL (ref 1.4–7.7)
Neutrophils Relative %: 45.3 % (ref 43.0–77.0)
Platelets: 263 10*3/uL (ref 150.0–400.0)
RBC: 4.41 Mil/uL (ref 3.87–5.11)
RDW: 13.8 % (ref 11.5–15.5)
WBC: 7.1 10*3/uL (ref 4.0–10.5)

## 2020-05-15 LAB — LDL CHOLESTEROL, DIRECT: Direct LDL: 94 mg/dL

## 2020-05-15 NOTE — Assessment & Plan Note (Signed)

## 2020-05-15 NOTE — Assessment & Plan Note (Signed)
Preventative protocols reviewed and updated unless pt declined. Discussed healthy diet and lifestyle.  

## 2020-05-15 NOTE — Patient Instructions (Addendum)
Good to see you today.  Labs today.  We will refer you to dermatology for further evaluation of bald spot on back of head.  Return in 3 months for chronic pain visit.   Health Maintenance for Postmenopausal Women Menopause is a normal process in which your ability to get pregnant comes to an end. This process happens slowly over many months or years, usually between the ages of 66 and 43. Menopause is complete when you have missed your menstrual periods for 12 months. It is important to talk with your health care provider about some of the most common conditions that affect women after menopause (postmenopausal women). These include heart disease, cancer, and bone loss (osteoporosis). Adopting a healthy lifestyle and getting preventive care can help to promote your health and wellness. The actions you take can also lower your chances of developing some of these common conditions. What should I know about menopause? During menopause, you may get a number of symptoms, such as:  Hot flashes. These can be moderate or severe.  Night sweats.  Decrease in sex drive.  Mood swings.  Headaches.  Tiredness.  Irritability.  Memory problems.  Insomnia. Choosing to treat or not to treat these symptoms is a decision that you make with your health care provider. Do I need hormone replacement therapy?  Hormone replacement therapy is effective in treating symptoms that are caused by menopause, such as hot flashes and night sweats.  Hormone replacement carries certain risks, especially as you become older. If you are thinking about using estrogen or estrogen with progestin, discuss the benefits and risks with your health care provider. What is my risk for heart disease and stroke? The risk of heart disease, heart attack, and stroke increases as you age. One of the causes may be a change in the body's hormones during menopause. This can affect how your body uses dietary fats, triglycerides, and  cholesterol. Heart attack and stroke are medical emergencies. There are many things that you can do to help prevent heart disease and stroke. Watch your blood pressure  High blood pressure causes heart disease and increases the risk of stroke. This is more likely to develop in people who have high blood pressure readings, are of African descent, or are overweight.  Have your blood pressure checked: ? Every 3-5 years if you are 63-26 years of age. ? Every year if you are 53 years old or older. Eat a healthy diet   Eat a diet that includes plenty of vegetables, fruits, low-fat dairy products, and lean protein.  Do not eat a lot of foods that are high in solid fats, added sugars, or sodium. Get regular exercise Get regular exercise. This is one of the most important things you can do for your health. Most adults should:  Try to exercise for at least 150 minutes each week. The exercise should increase your heart rate and make you sweat (moderate-intensity exercise).  Try to do strengthening exercises at least twice each week. Do these in addition to the moderate-intensity exercise.  Spend less time sitting. Even light physical activity can be beneficial. Other tips  Work with your health care provider to achieve or maintain a healthy weight.  Do not use any products that contain nicotine or tobacco, such as cigarettes, e-cigarettes, and chewing tobacco. If you need help quitting, ask your health care provider.  Know your numbers. Ask your health care provider to check your cholesterol and your blood sugar (glucose). Continue to have your blood  tested as directed by your health care provider. Do I need screening for cancer? Depending on your health history and family history, you may need to have cancer screening at different stages of your life. This may include screening for:  Breast cancer.  Cervical cancer.  Lung cancer.  Colorectal cancer. What is my risk for  osteoporosis? After menopause, you may be at increased risk for osteoporosis. Osteoporosis is a condition in which bone destruction happens more quickly than new bone creation. To help prevent osteoporosis or the bone fractures that can happen because of osteoporosis, you may take the following actions:  If you are 27-57 years old, get at least 1,000 mg of calcium and at least 600 mg of vitamin D per day.  If you are older than age 53 but younger than age 61, get at least 1,200 mg of calcium and at least 600 mg of vitamin D per day.  If you are older than age 65, get at least 1,200 mg of calcium and at least 800 mg of vitamin D per day. Smoking and drinking excessive alcohol increase the risk of osteoporosis. Eat foods that are rich in calcium and vitamin D, and do weight-bearing exercises several times each week as directed by your health care provider. How does menopause affect my mental health? Depression may occur at any age, but it is more common as you become older. Common symptoms of depression include:  Low or sad mood.  Changes in sleep patterns.  Changes in appetite or eating patterns.  Feeling an overall lack of motivation or enjoyment of activities that you previously enjoyed.  Frequent crying spells. Talk with your health care provider if you think that you are experiencing depression. General instructions See your health care provider for regular wellness exams and vaccines. This may include:  Scheduling regular health, dental, and eye exams.  Getting and maintaining your vaccines. These include: ? Influenza vaccine. Get this vaccine each year before the flu season begins. ? Pneumonia vaccine. ? Shingles vaccine. ? Tetanus, diphtheria, and pertussis (Tdap) booster vaccine. Your health care provider may also recommend other immunizations. Tell your health care provider if you have ever been abused or do not feel safe at home. Summary  Menopause is a normal process in  which your ability to get pregnant comes to an end.  This condition causes hot flashes, night sweats, decreased interest in sex, mood swings, headaches, or lack of sleep.  Treatment for this condition may include hormone replacement therapy.  Take actions to keep yourself healthy, including exercising regularly, eating a healthy diet, watching your weight, and checking your blood pressure and blood sugar levels.  Get screened for cancer and depression. Make sure that you are up to date with all your vaccines. This information is not intended to replace advice given to you by your health care provider. Make sure you discuss any questions you have with your health care provider. Document Revised: 06/22/2018 Document Reviewed: 06/22/2018 Elsevier Patient Education  2020 [employer].
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
## 2020-05-18 DIAGNOSIS — L659 Nonscarring hair loss, unspecified: Secondary | ICD-10-CM | POA: Insufficient documentation

## 2020-05-18 NOTE — Assessment & Plan Note (Signed)
Lost house after husband passed away earlier this year. Currently living with children.

## 2020-05-18 NOTE — Assessment & Plan Note (Signed)
Has seen rheum

## 2020-05-18 NOTE — Assessment & Plan Note (Addendum)
Previously saw [facility].  Stable period on abilify with celexa and benzo

## 2020-05-18 NOTE — Assessment & Plan Note (Signed)
[place] CSRS reviewed.  Continues oxycodone with interventional approach through pain management clinic in [facility]).

## 2020-05-18 NOTE — Assessment & Plan Note (Signed)
Chronic, stable period. Continue celexa, valium and sparing xanax.

## 2020-05-18 NOTE — Assessment & Plan Note (Signed)
Continues yearly lung cancer screening with CT.

## 2020-05-18 NOTE — Assessment & Plan Note (Signed)
Continue advair daily

## 2020-05-18 NOTE — Assessment & Plan Note (Signed)
Continue low dose lipitor with lovaza. Check FLP.  The ASCVD Risk score Denman George DC Jr., et al., 2013) failed to calculate for the following reasons:   The patient has a prior MI or stroke diagnosis

## 2020-05-18 NOTE — Assessment & Plan Note (Signed)
Continues linzess and bisacodyl prn

## 2020-05-18 NOTE — Assessment & Plan Note (Signed)
Continues acyclovir 400mg  BID. We previously recommended trying daily dosing. Will need to verify how she does with this.

## 2020-05-18 NOTE — Assessment & Plan Note (Signed)
Area of alopecia to right occiput after recent cellulitis/abscess effectively treated with keflex course. Alopecia persists - will refer to derm for eval.

## 2020-05-18 NOTE — Assessment & Plan Note (Signed)
Encouraged healthy diet and lifestyle choices to affect sustainable weight loss.  ?

## 2020-05-19 ENCOUNTER — Telehealth: Payer: Self-pay | Admitting: Family Medicine

## 2020-05-19 NOTE — Telephone Encounter (Signed)
On call note.  Reporting "bugs and worms" in the scalp.  Asking about keflex rx.  Advised UC eval.  I didn't call in keflex.  To PCP as FYI.

## 2020-05-20 ENCOUNTER — Telehealth: Payer: Self-pay | Admitting: Family Medicine

## 2020-05-20 ENCOUNTER — Telehealth: Payer: Self-pay

## 2020-05-20 NOTE — Telephone Encounter (Signed)
The reason for the mychart visit on 05/21/20 is due to she does not have a car it will be in the shop

## 2020-05-20 NOTE — Telephone Encounter (Signed)
Kaylee [name] calls in to report that the area on the crown of her head has gotten worse.  States she has been dx previously with cellulitis but is not on any abx for treatment.   States that Dr. Sharen Hones who saw the area on 11/3, is referring her to a dermatologist but she is very worried with the progression and feels like she needs an antibiotic in the interim.  Kaylee [name] states that area to the crown of her head is now red, feverish and draining large amounts or white/blood tinged fluid that does have a bit of an odor to it.  Also, she feels that her glands are swollen in that area. She is taking tylenol for the pain and feeling of potential low grade fever.  Kaylee [name] is very anxious over this.    Pharmacy she uses [facility]    Dr. Reece Agar please advise as to best next steps.   Thanks.

## 2020-05-20 NOTE — Telephone Encounter (Signed)
Also see phone note from [facility] today.

## 2020-05-20 NOTE — Telephone Encounter (Signed)
Recommend office visit to re evaluate if it's gotten that bad. She was advised to go to [facility] over weekend but don't think she went. I don't want to prescribe abx without seeing this patient first. Please place today at 4:30pm or tomorrow at 12:30pm.

## 2020-05-20 NOTE — Telephone Encounter (Signed)
[facility] Night - Client TELEPHONE ADVICE RECORD [facility] Patient Name: Kaylee [name] Gender: Female DOB: April 11, 1961 Age: 59 Y 6 M 11 D Return Phone Number: [phone] (Primary), [phone] (Secondary) Address: City/State/Zip: [place] [address] Client [facility] Night - Client Client Site [facility] - Night Physician [name] - MD Contact Type Call Who Is Calling Patient / Member / Family / Caregiver Call Type Triage / Clinical Relationship To Patient Self Return Phone Number [phone] (Secondary) Chief Complaint Wound Infection Reason for Call Symptomatic / Request for Health Information Initial Comment Caller states she was seen the other day to see Dr. Sharen Hones, she told him she had cellulitis and now it busted open now has little tiny worms all over her head. Translation No Nurse Assessment Nurse: Lynwood Dawley, RN, [place] Date/Time (Eastern Time): 05/19/2020 8:45:26 AM Confirm and document reason for call. If symptomatic, describe symptoms. ---caller states having "worms" or "lice" on wounds on head. has been treated for infection 3-4 weeks ago. no temp Does the patient have any new or worsening symptoms? ---Yes Will a triage be completed? ---Yes Related visit to physician within the last 2 weeks? ---No Does the PT have any chronic conditions? (i.e. diabetes, asthma, this includes High risk factors for pregnancy, etc.) ---Yes List chronic conditions. ---arthritis Is this a behavioral health or substance abuse call? ---No Guidelines Guideline Title Affirmed Question Affirmed Notes Nurse Date/Time (Eastern Time) Wound Infection SEVERE pain in the wound [place], RN, [place] 05/19/2020 8:48:51 AM Disp. Time Lamount Cohen Time) Disposition Final User 05/19/2020 8:29:25 AM Attempt made - no message left Lynwood Dawley, RN, [place] Large 05/19/2020 9:01:27 AM Paged On Call back to [facility], RN, [place] 05/19/2020 8:50:30 AM Go to ED Now Yes Lynwood Dawley, RN, Concepcion [place]k Disagree/Comply Disagree PLEASE NOTE: All timestamps contained within this report are represented as [place] Standard Time. CONFIDENTIALTY NOTICE: This fax transmission is intended only for the addressee. It contains information that is legally privileged, confidential or otherwise protected from use or disclosure. If you are not the intended recipient, you are strictly prohibited from reviewing, disclosing, copying using or disseminating any of this information or taking any action in reliance on or regarding this information. If you have received this fax in error, please notify us immediately by telephone so that we can arrange for its return to [place]. Phone: [phone], Toll-Free: [phone], Fax: [phone] Page: 2 of 2 Call Id: [id number] Caller Understands Yes PreDisposition Call Doctor Care Advice Given Per Guideline GO TO ED NOW: * You need to be seen in the Emergency Department. PAIN MEDICINES: * For pain relief, you can take either acetaminophen, ibuprofen, or naproxen. CARE ADVICE per Wound Infection (Adult) guideline. Referrals GO TO FACILITY UNDECIDED Paging DoctorName Phone DateTime Result/Outcome Message Type Notes Raechel Ache - MD [phone] 05/19/2020 9:01:27 AM Paged On Call Back to Call Center Doctor Paged Raechel Ache - MD 05/19/2020 9:02:37 AM Spoke with On Call - General Message Result MD rec caller go to urgent care today for evaluation. needs evaluation before antibiotics can be prescribed. caller aware

## 2020-05-20 NOTE — Telephone Encounter (Signed)
Noted  

## 2020-05-21 ENCOUNTER — Telehealth (INDEPENDENT_AMBULATORY_CARE_PROVIDER_SITE_OTHER): Payer: Medicare Other | Admitting: Family Medicine

## 2020-05-21 ENCOUNTER — Encounter: Payer: Self-pay | Admitting: Family Medicine

## 2020-05-21 VITALS — Ht 65.0 in | Wt 230.0 lb

## 2020-05-21 DIAGNOSIS — L989 Disorder of the skin and subcutaneous tissue, unspecified: Secondary | ICD-10-CM

## 2020-05-21 DIAGNOSIS — L659 Nonscarring hair loss, unspecified: Secondary | ICD-10-CM

## 2020-05-21 MED ORDER — KETOCONAZOLE 2 % EX SHAM: 1.0000 "application " | MEDICATED_SHAMPOO | CUTANEOUS | 0 refills | Status: DC

## 2020-05-21 MED ORDER — DOXYCYCLINE HYCLATE 100 MG PO TABS: 100.0000 mg | ORAL_TABLET | Freq: Two times a day (BID) | ORAL | 0 refills | Status: DC

## 2020-05-21 NOTE — Telephone Encounter (Signed)
Pt had virtual visit with Dr. Reece Agar today.

## 2020-05-21 NOTE — Assessment & Plan Note (Signed)
I don't suspect ongoing cellulitis or abscess. Will treat with ketoconazole shampoo twice weekly. Will also cover for folliculitis with doxy 1 wk course.  Advised OV if not improving with above.

## 2020-05-21 NOTE — Telephone Encounter (Addendum)
Attempted again to contact pt.  No answer.  Vm box full.  Also, lvm for pt's emergency contact, Cleopatra Cedar (on dpr), asking her to have pt call asap.  Need to relay Dr. Timoteo Expose message.

## 2020-05-21 NOTE — Progress Notes (Signed)
Virtual visit completed through MyChart, a video enabled telemedicine application. Due to national recommendations of social distancing due to COVID-19, a virtual visit is felt to be most appropriate for this patient at this time. Reviewed limitations, risks, security and privacy concerns of performing a virtual visit and the availability of in person appointments. I also reviewed that there may be a patient responsible charge related to this service. The patient agreed to proceed.   Patient location: home Provider location: [place] [facility], office Persons participating in this virtual visit: patient, provider   If any vitals were documented, they were collected by patient at home unless specified below.    Ht 5\' 5"  (1.651 m)   Wt 230 lb (104.3 kg)   BMI 38.27 kg/m    CC: recheck cellulitis Subjective:    Patient ID: Kaylee [name], female    DOB: 1961/04/20, 59 y.o.   MRN: [id number]  HPI: Kaylee [name] is a 59 y.o. female presenting on 05/21/2020 for Cellulitis   See prior note for details.  Seen at ER 03/31/2020 for cellulitis/abscess of scalp treated with keflex 500mg  QID course with improvement. States she had "worms in head that blew holes throughout the head".  Keflex has helped clear infection but she has persistent bald area to R occiput.  Grand daughter with recent MRSA skin infection.  3d ago started having new sores to scalp at sites she continued scratching, mainly to the vertex and occiput of skull, as well as weeping drainage to several areas. No significant redness or fever but does feel malaise with this. States she feels just like when she had cellulitis in September.      Relevant past medical, surgical, family and social history reviewed and updated as indicated. Interim medical history since our last visit reviewed. Allergies and medications reviewed and updated. Outpatient Medications Prior to Visit  Medication Sig Dispense Refill  . acyclovir  (ZOVIRAX) 400 MG tablet Take 1 tablet (400 mg total) by mouth 2 (two) times daily. 60 tablet 6  . ADVAIR DISKUS 250-50 MCG/DOSE AEPB Inhale 1 puff into the lungs 2 (two) times daily. (Patient taking differently: Inhale 1 puff into the lungs 2 (two) times daily. Patient using in the morning once daily) 1 each 6  . albuterol (VENTOLIN HFA) 108 (90 Base) MCG/ACT inhaler INHALE 2 PUFFS INTO THE LUNGS EVERY 4 HOURS AS NEEDED. 18 g 3  . allopurinol (ZYLOPRIM) 100 MG tablet TAKE 1 TABLET BY MOUTH DAILY 90 tablet 0  . Alpha-Lipoic Acid 600 MG CAPS Take 1 capsule (600 mg total) by mouth daily. 60 capsule 11  . ALPRAZolam (XANAX) 1 MG tablet TAKE 1 TABLET BY MOUTH DAILY AS NEEDED FOR ANXIETY 10 tablet 0  . ARIPiprazole (ABILIFY) 5 MG tablet Take 1 tablet (5 mg total) by mouth daily. 30 tablet 11  . aspirin 81 MG chewable tablet Chew 1 tablet by mouth daily.    [name] atorvastatin (LIPITOR) 10 MG tablet TAKE 1 TABLET BY MOUTH DAILY 90 tablet 0  . citalopram (CELEXA) 10 MG tablet TAKE 1 TABLET BY MOUTH DAILY 90 tablet 0  . colchicine 0.6 MG tablet Take 1 tablet (0.6 mg total) by mouth daily as needed. 30 tablet 3  . diazepam (VALIUM) 10 MG tablet TAKE 1 TABLET BY MOUTH TWICE A DAY 60 tablet 0  . dicyclomine (BENTYL) 10 MG capsule TAKE 1 CAPSULE BY MOUTH 3 TIMES A DAY ASNEEDED FOR SPASMS. TAKE MEDICATION WITH MEALS 60 capsule 3  .  esomeprazole (NEXIUM) 40 MG capsule TAKE 1 CAPSULE BY MOUTH DAILY 90 capsule 0  . estradiol (ESTRACE) 0.1 MG/GM vaginal cream Apply 1 gram per vagina every night for 2 weeks, then apply three times a week 30 g 12  . etodolac (LODINE) 400 MG tablet Take 1 tablet (400 mg total) by mouth 2 (two) times daily as needed for moderate pain. 60 tablet 11  . fluticasone (FLONASE) 50 MCG/ACT nasal spray Place 2 sprays into both nostrils daily. 16 g 11  . furosemide (LASIX) 40 MG tablet TAKE 1 TABLET BY MOUTH DAILY AS NEEDED FOR FLUID 30 tablet 4  . glucosamine-chondroitin 500-400 MG tablet Take 2  tablets by mouth daily. 180 tablet 3  . LINZESS 290 MCG CAPS capsule TAKE 1 CAPSULE BY MOUTH DAILY BEFORE BREAKFAST 30 capsule 11  . methocarbamol (ROBAXIN) 750 MG tablet TAKE 1 TABLET BY MOUTH TWICE A DAY AS NEEDED FOR MUSCLE SPASMS 60 tablet 3  . MISC NATURAL PRODUCTS PO Take by mouth. Keto diet supplement pills    . NEOMYCIN-POLYMYXIN-HYDROCORTISONE (CORTISPORIN) 1 % SOLN OTIC solution PLACE 3 DROPS INTO THE LEFT EAR 3 TIMES A DAY AS DIRECTED 10 mL 0  . nitrofurantoin (MACRODANTIN) 100 MG capsule Take 1 capsule (100 mg total) by mouth daily. 90 capsule 3  . nystatin cream (MYCOSTATIN) Apply 1 application topically 2 (two) times daily. 30 g 6  . omega-3 acid ethyl esters (LOVAZA) 1 g capsule Take 2 capsules (2 g total) by mouth daily. 180 capsule 3  . OVER THE COUNTER MEDICATION Take 2 tablets by mouth as needed. IBgard    . oxyCODONE-acetaminophen (PERCOCET) 7.5-325 MG tablet TAKE 1 TABLET BY MOUTH EVERY 8 HOURS AS NEEDED FOR PAIN 90 tablet 0  . potassium chloride (KLOR-CON) 10 MEQ tablet Take 1 tablet (10 mEq total) by mouth daily. 90 tablet 3  . promethazine (PHENERGAN) 12.5 MG tablet Take 1 tablet (12.5 mg total) by mouth every 8 (eight) hours as needed for nausea. 30 tablet 0  . SPIRIVA HANDIHALER 18 MCG inhalation capsule INHALE ONE CAPSULE AS DIRECTED ONCE A DAY 90 capsule 0  . tiZANidine (ZANAFLEX) 4 MG tablet Take 1 tablet (4 mg total) by mouth 2 (two) times daily as needed for muscle spasms. 60 tablet 3  . trimethoprim-polymyxin b (POLYTRIM) ophthalmic solution Place 1 drop into the left eye every 6 (six) hours. 10 mL 0   No facility-administered medications prior to visit.     Per HPI unless specifically indicated in ROS section below Review of Systems Objective:  Ht 5\' 5"  (1.651 m)   Wt 230 lb (104.3 kg)   BMI 38.27 kg/m   Wt Readings from Last 3 Encounters:  05/21/20 230 lb (104.3 kg)  05/15/20 230 lb 6 oz (104.5 kg)  12/27/19 233 lb 2 oz (105.7 kg)       Physical  exam: Gen: alert, NAD, not ill appearing Pulm: speaks in complete sentences without increased work of breathing Psych: normal mood, normal thought content  Skin: difficult to evaluate over video but she shows several white scabs that have fallen off scalp, unable to appreciate erythema to area. Bald area to R occiput remains      Assessment & Plan:   Problem List Items Addressed This Visit    Sore on scalp - Primary    I don't suspect ongoing cellulitis or abscess. Will treat with ketoconazole shampoo twice weekly. Will also cover for folliculitis with doxy 1 wk course.  Advised OV if  not improving with above.       Alopecia    Bald area persists, awaiting derm eval.           Meds ordered this encounter  Medications  . doxycycline (VIBRA-TABS) 100 MG tablet    Sig: Take 1 tablet (100 mg total) by mouth 2 (two) times daily.    Dispense:  14 tablet    Refill:  0  . ketoconazole (NIZORAL) 2 % shampoo    Sig: Apply 1 application topically 2 (two) times a week.    Dispense:  100 mL    Refill:  0   No orders of the defined types were placed in this encounter.   I discussed the assessment and treatment plan with the patient. The patient was provided an opportunity to ask questions and all were answered. The patient agreed with the plan and demonstrated an understanding of the instructions. The patient was advised to call back or seek an in-person evaluation if the symptoms worsen or if the condition fails to improve as anticipated.  Follow up plan: No follow-ups on file.  [name], MD

## 2020-05-21 NOTE — Telephone Encounter (Addendum)
Attempted to contact pt.  No answer.  Vm box full.  Need to relay Dr. G's message.  

## 2020-05-21 NOTE — Telephone Encounter (Signed)
As long as I can see the scalp area. If she can't do video visit, will need to reschedule.

## 2020-05-21 NOTE — Telephone Encounter (Signed)
Patient was contacted back yesterday and set up for virtual visit today (see separate phone string messages).

## 2020-05-21 NOTE — Assessment & Plan Note (Addendum)
Bald area persists, awaiting derm eval.

## 2020-05-23 ENCOUNTER — Telehealth: Payer: Self-pay

## 2020-05-23 NOTE — Telephone Encounter (Signed)
Attempted to contact patient to schedule lung screening scan but unable to reach her. Unable to leave message due to VM not being set up.

## 2020-05-24 ENCOUNTER — Ambulatory Visit: Payer: Medicare Other

## 2020-05-24 NOTE — Chronic Care Management (AMB) (Signed)
   Chronic Care Management Pharmacy  Name: Kaylee [name]  MRN: [id number] DOB: [birth year]  [name] was contacted for their Follow-Up CCM visit with the clinical pharmacist via telephone.  Left voicemail with contact information for rescheduling.  Phil Dopp, PharmD Clinical Pharmacist [facility] [phone]

## 2020-05-28 ENCOUNTER — Other Ambulatory Visit: Payer: Self-pay | Admitting: Family Medicine

## 2020-05-28 NOTE — Telephone Encounter (Signed)
Name of Medication: Alprazolam, Diazepam, Oxycodone-APAP Name of Pharmacy: [facility] Last [place] or Written Date and Quantity: 04/29/20      Alprazolam- #10      Diazepam- #60      Oxycodone-APAP- #90 Last Office Visit and Type: 05/15/20, AWV Next Office Visit and Type: 08/16/20, 3 mo f/u Last Controlled Substance Agreement Date: 02/17/19 Last UDS: 10/09/19  Colchicine last filled:  04/29/20, #30 Methocarbamol last filled:  03/29/20, #60

## 2020-05-29 NOTE — Telephone Encounter (Signed)
ERx 

## 2020-06-02 ENCOUNTER — Telehealth: Payer: Self-pay

## 2020-06-02 NOTE — Telephone Encounter (Signed)
Attempted to call pts number but there is no VM set. Called and left message on Emergency contact/ daughter's number, [name], for pt to call and schedule lung screening CT.

## 2020-06-27 DIAGNOSIS — M797 Fibromyalgia: Secondary | ICD-10-CM | POA: Diagnosis not present

## 2020-06-27 DIAGNOSIS — Z881 Allergy status to other antibiotic agents status: Secondary | ICD-10-CM | POA: Diagnosis not present

## 2020-06-27 DIAGNOSIS — Z7951 Long term (current) use of inhaled steroids: Secondary | ICD-10-CM | POA: Diagnosis not present

## 2020-06-27 DIAGNOSIS — I129 Hypertensive chronic kidney disease with stage 1 through stage 4 chronic kidney disease, or unspecified chronic kidney disease: Secondary | ICD-10-CM | POA: Diagnosis not present

## 2020-06-27 DIAGNOSIS — I252 Old myocardial infarction: Secondary | ICD-10-CM | POA: Diagnosis not present

## 2020-06-27 DIAGNOSIS — J449 Chronic obstructive pulmonary disease, unspecified: Secondary | ICD-10-CM | POA: Diagnosis not present

## 2020-06-27 DIAGNOSIS — N189 Chronic kidney disease, unspecified: Secondary | ICD-10-CM | POA: Diagnosis not present

## 2020-06-27 DIAGNOSIS — M109 Gout, unspecified: Secondary | ICD-10-CM | POA: Diagnosis not present

## 2020-06-27 DIAGNOSIS — J029 Acute pharyngitis, unspecified: Secondary | ICD-10-CM | POA: Diagnosis not present

## 2020-06-27 DIAGNOSIS — Z888 Allergy status to other drugs, medicaments and biological substances status: Secondary | ICD-10-CM | POA: Diagnosis not present

## 2020-06-27 DIAGNOSIS — Z885 Allergy status to narcotic agent status: Secondary | ICD-10-CM | POA: Diagnosis not present

## 2020-06-27 DIAGNOSIS — Z87891 Personal history of nicotine dependence: Secondary | ICD-10-CM | POA: Diagnosis not present

## 2020-06-27 DIAGNOSIS — L989 Disorder of the skin and subcutaneous tissue, unspecified: Secondary | ICD-10-CM | POA: Diagnosis not present

## 2020-06-27 DIAGNOSIS — Z79899 Other long term (current) drug therapy: Secondary | ICD-10-CM | POA: Diagnosis not present

## 2020-06-27 DIAGNOSIS — L03811 Cellulitis of head [any part, except face]: Secondary | ICD-10-CM | POA: Diagnosis not present

## 2020-06-27 DIAGNOSIS — Z882 Allergy status to sulfonamides status: Secondary | ICD-10-CM | POA: Diagnosis not present

## 2020-06-28 ENCOUNTER — Other Ambulatory Visit: Payer: Self-pay | Admitting: Family Medicine

## 2020-06-28 NOTE — Telephone Encounter (Signed)
Name of Medication: Alprazolam, Diazepam, Oxycodone-APAP Name of Pharmacy: [facility] Last [place] or Written Date and Quantity: 05/29/20      Alprazolam- #10      Diazepam- #60      Oxycodone-APAP- #90 Last Office Visit and Type: 05/15/20, AWV Next Office Visit and Type: 08/16/20, 3 mo f/u Last Controlled Substance Agreement Date: 02/17/19 Last UDS: 10/09/19

## 2020-07-02 NOTE — Telephone Encounter (Signed)
Patient called in and requesting controlled substances be called in so she can pick them up before the pharmacy closes.

## 2020-07-02 NOTE — Telephone Encounter (Signed)
ERx 

## 2020-07-17 ENCOUNTER — Encounter: Payer: Self-pay | Admitting: *Deleted

## 2020-07-25 ENCOUNTER — Other Ambulatory Visit: Payer: Self-pay | Admitting: Family Medicine

## 2020-07-25 NOTE — Telephone Encounter (Signed)
Requesting:xanax, valium, percocet,  Contract: no UDS:n/a Last OV: 05/15/20 Next OV: 08/16/20 Last Refill: xanax 07/02/20 #10-0rf Valium 07/02/20  #60-0rf Percocet 07/02/20  #90-0rf Database:   Please advise

## 2020-07-26 ENCOUNTER — Other Ambulatory Visit: Payer: Self-pay

## 2020-07-26 ENCOUNTER — Ambulatory Visit
Admission: RE | Admit: 2020-07-26 | Discharge: 2020-07-26 | Disposition: A | Discharge status: Home / Self Care | Payer: Medicare Other | Source: Ambulatory Visit | Attending: Nurse Practitioner | Admitting: Nurse Practitioner

## 2020-07-26 DIAGNOSIS — Z122 Encounter for screening for malignant neoplasm of respiratory organs: Secondary | ICD-10-CM | POA: Diagnosis not present

## 2020-07-26 DIAGNOSIS — T148XXA Other injury of unspecified body region, initial encounter: Secondary | ICD-10-CM | POA: Diagnosis not present

## 2020-07-26 DIAGNOSIS — Z87891 Personal history of nicotine dependence: Secondary | ICD-10-CM | POA: Diagnosis not present

## 2020-07-26 DIAGNOSIS — B89 Unspecified parasitic disease: Secondary | ICD-10-CM | POA: Diagnosis not present

## 2020-07-26 DIAGNOSIS — L03811 Cellulitis of head [any part, except face]: Secondary | ICD-10-CM | POA: Diagnosis not present

## 2020-07-27 NOTE — Telephone Encounter (Signed)
ERx 

## 2020-08-01 ENCOUNTER — Encounter: Payer: Self-pay | Admitting: *Deleted

## 2020-08-09 DIAGNOSIS — T148XXA Other injury of unspecified body region, initial encounter: Secondary | ICD-10-CM | POA: Diagnosis not present

## 2020-08-16 ENCOUNTER — Ambulatory Visit: Payer: Medicare Other | Admitting: Family Medicine

## 2020-08-21 ENCOUNTER — Ambulatory Visit: Payer: Medicare Other | Admitting: Family Medicine

## 2020-08-29 ENCOUNTER — Other Ambulatory Visit: Payer: Self-pay | Admitting: Family Medicine

## 2020-08-29 NOTE — Telephone Encounter (Signed)
Name of Medication: Alprazolam, Diazepam, Oxycodone-APAP Name of Pharmacy: [facility] Last [place] or Written Date and Quantity: 07/30/20      Alprazolam- #10      Diazepam- #60      Oxycodone-APAP- #90 Last Office Visit and Type: 05/15/20, AWV Next Office Visit and Type: 09/06/20, 3 mo chronic pain f/u Last Controlled Substance Agreement Date: 02/17/19 Last UDS: 10/09/19

## 2020-08-30 ENCOUNTER — Other Ambulatory Visit: Payer: Self-pay | Admitting: Family Medicine

## 2020-08-30 NOTE — Telephone Encounter (Signed)
Acyclovir Last filled:  07/25/20, #60 Last OV:  11/3/212, AWV Next OV:  09/06/20, 3 mo chronic pain f/u

## 2020-08-31 NOTE — Telephone Encounter (Signed)
ERx 

## 2020-09-02 ENCOUNTER — Other Ambulatory Visit: Payer: Self-pay | Admitting: Family Medicine

## 2020-09-02 NOTE — Telephone Encounter (Signed)
Pharmacy requests refill on: Citalopram 10 mg   LAST REFILL: 03/27/2020 (Q-90, R-0) LAST OV: 05/15/2020 NEXT OV: 09/06/2020 PHARMACY: [facility]

## 2020-09-06 ENCOUNTER — Ambulatory Visit (INDEPENDENT_AMBULATORY_CARE_PROVIDER_SITE_OTHER): Payer: Medicare Other | Admitting: Family Medicine

## 2020-09-06 ENCOUNTER — Encounter: Payer: Self-pay | Admitting: Family Medicine

## 2020-09-06 VITALS — BP 120/72 | HR 89 | Temp 98.2°F | Ht 66.0 in | Wt 211.1 lb

## 2020-09-06 DIAGNOSIS — M5432 Sciatica, left side: Secondary | ICD-10-CM

## 2020-09-06 DIAGNOSIS — G894 Chronic pain syndrome: Secondary | ICD-10-CM

## 2020-09-06 DIAGNOSIS — L659 Nonscarring hair loss, unspecified: Secondary | ICD-10-CM

## 2020-09-06 DIAGNOSIS — E669 Obesity, unspecified: Secondary | ICD-10-CM

## 2020-09-06 DIAGNOSIS — K219 Gastro-esophageal reflux disease without esophagitis: Secondary | ICD-10-CM

## 2020-09-06 DIAGNOSIS — Z599 Problem related to housing and economic circumstances, unspecified: Secondary | ICD-10-CM

## 2020-09-06 DIAGNOSIS — F411 Generalized anxiety disorder: Secondary | ICD-10-CM

## 2020-09-06 DIAGNOSIS — G8929 Other chronic pain: Secondary | ICD-10-CM

## 2020-09-06 NOTE — Patient Instructions (Addendum)
You are doing well today Update urine drug screen.  Return as needed or in 3 months for pain management visit.

## 2020-09-06 NOTE — Assessment & Plan Note (Addendum)
Weight loss noted.  High stress period.

## 2020-09-06 NOTE — Assessment & Plan Note (Signed)
Continues staying with daughter. Planning to move into her own mobil home with assistance of best friend shortly.

## 2020-09-06 NOTE — Assessment & Plan Note (Signed)
Endorses stable period on current regimen.
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
## 2020-09-06 NOTE — Assessment & Plan Note (Signed)
DOP with trichotellomania. Pt remains convinced she had parasites in scalp but states this has resolved and she is doing well now, declines change in mood medications at this time.

## 2020-09-06 NOTE — Assessment & Plan Note (Addendum)
[place] CSRS reviewed.  Almost due for UDS - update today.  Continue oxycodone with interventional approach through pain clinic in [facility] (Dr Eustaquio Maize) Handicap placard application form filled out.

## 2020-09-06 NOTE — Assessment & Plan Note (Signed)
Chronic, stable period on nexium daily.

## 2020-09-09 ENCOUNTER — Telehealth: Payer: Self-pay | Admitting: Family Medicine

## 2020-09-09 NOTE — Telephone Encounter (Signed)
Spoke with pt notifying her Dr. Reece Agar had 6 refills on last rx.  Suggested she just contact the pharmacy to get it filled.  Pt verbalizes understanding and expresses her thanks.

## 2020-09-09 NOTE — Telephone Encounter (Signed)
Pt called stating she had her bag packed to go on a trip this weekend and accidentally left it at her daughter's house. It had her medication in it and her daughter thought it was a bag of trash so she threw it out. Kaylee [name] said she is just needing a new prescription for her Acyclovir sent to [facility] because she cannot wait until next month for that. Please advise.

## 2020-09-11 LAB — DRUG MONITORING, PANEL 8 WITH CONFIRMATION, URINE
6 Acetylmorphine: NEGATIVE ng/mL (ref <10)
Alcohol Metabolites: NEGATIVE ng/mL
Alphahydroxyalprazolam: NEGATIVE ng/mL (ref <25)
Alphahydroxymidazolam: NEGATIVE ng/mL (ref <50)
Alphahydroxytriazolam: NEGATIVE ng/mL (ref <50)
Aminoclonazepam: NEGATIVE ng/mL (ref <25)
Amphetamines: NEGATIVE ng/mL (ref <500)
Benzodiazepines: POSITIVE ng/mL — AB (ref <100)
Buprenorphine, Urine: NEGATIVE ng/mL (ref <5)
Cocaine Metabolite: NEGATIVE ng/mL (ref <150)
Codeine: NEGATIVE ng/mL (ref <50)
Creatinine: 77.6 mg/dL
Hydrocodone: NEGATIVE ng/mL (ref <50)
Hydromorphone: NEGATIVE ng/mL (ref <50)
Hydroxyethylflurazepam: NEGATIVE ng/mL (ref <50)
Lorazepam: NEGATIVE ng/mL (ref <50)
MDMA: NEGATIVE ng/mL (ref <500)
Marijuana Metabolite: NEGATIVE ng/mL (ref <20)
Morphine: NEGATIVE ng/mL (ref <50)
Nordiazepam: 527 ng/mL — ABNORMAL HIGH (ref <50)
Norhydrocodone: NEGATIVE ng/mL (ref <50)
Noroxycodone: 1851 ng/mL — ABNORMAL HIGH (ref <50)
Opiates: NEGATIVE ng/mL (ref <100)
Oxazepam: 1274 ng/mL — ABNORMAL HIGH (ref <50)
Oxidant: NEGATIVE ug/mL
Oxycodone: 2996 ng/mL — ABNORMAL HIGH (ref <50)
Oxycodone: POSITIVE ng/mL — AB (ref <100)
Oxymorphone: 715 ng/mL — ABNORMAL HIGH (ref <50)
Temazepam: 1256 ng/mL — ABNORMAL HIGH (ref <50)
pH: 5.2 (ref 4.5–9.0)

## 2020-09-11 LAB — DM TEMPLATE

## 2020-09-25 ENCOUNTER — Other Ambulatory Visit: Payer: Self-pay | Admitting: Family Medicine

## 2020-09-25 NOTE — Telephone Encounter (Signed)
Name of Medication: Diazepam, Alprazolam, Oxycodone-APAP Name of Pharmacy:  [facility] Last [place] or Written Date and Quantity: 08/31/20      Diazepam- #60      Alprazolam- #10      Oxycodone-APAP- #90 Last Office Visit and Type: 09/06/20, 3 mo chronic pain f/u Next Office Visit and Type: 12/04/20, 3 mo chronic pain f/u Last Controlled Substance Agreement Date: 02/17/19 Last UDS: 10/09/19  Robaxin last filled:  09/02/20, #60 Colchicine last filled:  08/30/20, #30 Potassium last filled:  08/30/20, #90

## 2020-09-27 NOTE — Telephone Encounter (Signed)
ERx 

## 2020-09-30 ENCOUNTER — Other Ambulatory Visit: Payer: Self-pay | Admitting: Family Medicine

## 2020-09-30 NOTE — Telephone Encounter (Signed)
Refill request Lipitor Last refill 03/27/20 #90 Last office visit 09/06/20 See drug warning with Colchicine

## 2020-10-03 ENCOUNTER — Ambulatory Visit: Payer: Medicare Other | Admitting: Dermatology

## 2020-10-08 DIAGNOSIS — F32A Depression, unspecified: Secondary | ICD-10-CM | POA: Diagnosis not present

## 2020-10-08 DIAGNOSIS — M797 Fibromyalgia: Secondary | ICD-10-CM | POA: Diagnosis not present

## 2020-10-08 DIAGNOSIS — Z87891 Personal history of nicotine dependence: Secondary | ICD-10-CM | POA: Diagnosis not present

## 2020-10-08 DIAGNOSIS — Z79899 Other long term (current) drug therapy: Secondary | ICD-10-CM | POA: Diagnosis not present

## 2020-10-08 DIAGNOSIS — M1990 Unspecified osteoarthritis, unspecified site: Secondary | ICD-10-CM | POA: Diagnosis not present

## 2020-10-08 DIAGNOSIS — B839 Helminthiasis, unspecified: Secondary | ICD-10-CM | POA: Diagnosis not present

## 2020-10-08 DIAGNOSIS — Z885 Allergy status to narcotic agent status: Secondary | ICD-10-CM | POA: Diagnosis not present

## 2020-10-08 DIAGNOSIS — Z882 Allergy status to sulfonamides status: Secondary | ICD-10-CM | POA: Diagnosis not present

## 2020-10-08 DIAGNOSIS — N189 Chronic kidney disease, unspecified: Secondary | ICD-10-CM | POA: Diagnosis not present

## 2020-10-08 DIAGNOSIS — M109 Gout, unspecified: Secondary | ICD-10-CM | POA: Diagnosis not present

## 2020-10-08 DIAGNOSIS — R21 Rash and other nonspecific skin eruption: Secondary | ICD-10-CM | POA: Diagnosis not present

## 2020-10-08 DIAGNOSIS — I252 Old myocardial infarction: Secondary | ICD-10-CM | POA: Diagnosis not present

## 2020-10-08 DIAGNOSIS — J449 Chronic obstructive pulmonary disease, unspecified: Secondary | ICD-10-CM | POA: Diagnosis not present

## 2020-10-08 DIAGNOSIS — I129 Hypertensive chronic kidney disease with stage 1 through stage 4 chronic kidney disease, or unspecified chronic kidney disease: Secondary | ICD-10-CM | POA: Diagnosis not present

## 2020-10-08 DIAGNOSIS — Z888 Allergy status to other drugs, medicaments and biological substances status: Secondary | ICD-10-CM | POA: Diagnosis not present

## 2020-10-30 DIAGNOSIS — Z885 Allergy status to narcotic agent status: Secondary | ICD-10-CM | POA: Diagnosis not present

## 2020-10-30 DIAGNOSIS — J449 Chronic obstructive pulmonary disease, unspecified: Secondary | ICD-10-CM | POA: Diagnosis not present

## 2020-10-30 DIAGNOSIS — Z8619 Personal history of other infectious and parasitic diseases: Secondary | ICD-10-CM | POA: Diagnosis not present

## 2020-10-30 DIAGNOSIS — M109 Gout, unspecified: Secondary | ICD-10-CM | POA: Diagnosis not present

## 2020-10-30 DIAGNOSIS — Z87891 Personal history of nicotine dependence: Secondary | ICD-10-CM | POA: Diagnosis not present

## 2020-10-30 DIAGNOSIS — N189 Chronic kidney disease, unspecified: Secondary | ICD-10-CM | POA: Diagnosis not present

## 2020-10-30 DIAGNOSIS — M797 Fibromyalgia: Secondary | ICD-10-CM | POA: Diagnosis not present

## 2020-10-30 DIAGNOSIS — Z888 Allergy status to other drugs, medicaments and biological substances status: Secondary | ICD-10-CM | POA: Diagnosis not present

## 2020-10-30 DIAGNOSIS — M1990 Unspecified osteoarthritis, unspecified site: Secondary | ICD-10-CM | POA: Diagnosis not present

## 2020-10-30 DIAGNOSIS — I129 Hypertensive chronic kidney disease with stage 1 through stage 4 chronic kidney disease, or unspecified chronic kidney disease: Secondary | ICD-10-CM | POA: Diagnosis not present

## 2020-10-30 DIAGNOSIS — Z79899 Other long term (current) drug therapy: Secondary | ICD-10-CM | POA: Diagnosis not present

## 2020-10-30 DIAGNOSIS — Z882 Allergy status to sulfonamides status: Secondary | ICD-10-CM | POA: Diagnosis not present

## 2020-10-30 DIAGNOSIS — I252 Old myocardial infarction: Secondary | ICD-10-CM | POA: Diagnosis not present

## 2020-11-06 ENCOUNTER — Other Ambulatory Visit: Payer: Self-pay | Admitting: Family Medicine

## 2020-11-06 ENCOUNTER — Telehealth: Payer: Self-pay | Admitting: Family Medicine

## 2020-11-06 NOTE — Telephone Encounter (Signed)
  LAST APPOINTMENT DATE: 09/30/2020   NEXT APPOINTMENT DATE:@5 /25/2022  MEDICATION: oxycodone, xanax, diazepam  PHARMACY: [facility]   Let patient know to contact pharmacy at the end of the day to make sure medication is ready.  Please notify patient to allow 48-72 hours to process  Encourage patient to contact the pharmacy for refills or they can request refills through [facility]  CLINICAL FILLS OUT ALL BELOW:   LAST REFILL:  QTY:  REFILL DATE:    OTHER COMMENTS:    Okay for refill?  Please advise

## 2020-11-06 NOTE — Telephone Encounter (Signed)
Name of Medication: Oxycodone, Alprazolam, Diazepam Name of Pharmacy: [facility] Last [place] or Written Date and Quantity: Oxycodone 09/27/20 #90,  Alprazolam 09/27/20 #10, Diazepam 09/27/20 #60 Last Office Visit and Type: 09/06/20 Next Office Visit and Type:   12/04/20 Last Controlled Substance Agreement Date: 09/06/20 Last UDS:09/06/20

## 2020-11-06 NOTE — Telephone Encounter (Signed)
Received rx refill requests and have forwarded to Dr. Reece Agar to authorize.

## 2020-11-08 NOTE — Telephone Encounter (Signed)
ERx 

## 2020-11-13 ENCOUNTER — Telehealth: Payer: Self-pay

## 2020-11-13 NOTE — Chronic Care Management (AMB) (Addendum)
Chronic Care Management Pharmacy Assistant   Name: Kaylee [name]  MRN: [id number] DOB: 1961-07-07  Reason for Encounter: Disease State Review  Conditions to be addressed/monitored: COPD  Recent office visits:  09/06/2020- Dr.Gutierrez PCP - Stopped aspirin 81mg  (per the patient she is not taking) and stopped doxycycline 100mg  due to finished course. F/U 3 months.  Recent consult visits:  10/30/2020- Dr.[address] ED - History of worms -  Started Acyclovir 400mg  take 1 tablet every 4 hours while awake.   10/08/2020- Dr.Zachary Cardon  [place] ED - Rash - No medication changes. 08/09/2020   Dr[facility]  Continue use of mupirocin ointment 2-3 times daily 07/26/2020  Dr.Eric [facility]. Started Ivermectin 3mg  tablets take 6 tablets at once and another 6 tablets 1 week later,started mupirocin 2% ointment apply topically daily   07/08/2020  Dr.Gary [facility] ED - Started Zovirax 400mg  take 1 tablet every 4 hours while awake 06/27/2020  Dr.Eric [facility] ED - Started Keflex 500mg  take 1 capsule 3 times a day for 10 days  Hospital visits:  None in previous 6 months  Medications: Outpatient Encounter Medications as of 11/13/2020  Medication Sig   acyclovir (ZOVIRAX) 400 MG tablet TAKE 1 TABLET BY MOUTH TWICE A DAY   ADVAIR DISKUS 250-50 MCG/DOSE AEPB Inhale 1 puff into the lungs 2 (two) times daily. (Patient taking differently: Inhale 1 puff into the lungs 2 (two) times daily. Patient using in the morning once daily)   albuterol (VENTOLIN HFA) 108 (90 Base) MCG/ACT inhaler INHALE 2 PUFFS INTO THE LUNGS EVERY 4 HOURS AS NEEDED.   allopurinol (ZYLOPRIM) 100 MG tablet TAKE 1 TABLET BY MOUTH DAILY   Alpha-Lipoic Acid 600 MG CAPS Take 1 capsule (600 mg total) by mouth daily.   ALPRAZolam (XANAX) 1 MG tablet TAKE 1 TABLET BY MOUTH DAILY AS NEEDED FOR ANXIETY   ARIPiprazole (ABILIFY) 5 MG tablet Take 1 tablet (5 mg total) by  mouth daily.   atorvastatin (LIPITOR) 10 MG tablet TAKE 1 TABLET BY MOUTH DAILY   citalopram (CELEXA) 10 MG tablet TAKE 1 TABLET BY MOUTH DAILY   colchicine 0.6 MG tablet TAKE 1 TABLET BY MOUTH DAILY AS NEEDED.   diazepam (VALIUM) 10 MG tablet TAKE 1 TABLET BY MOUTH TWICE A DAY   dicyclomine (BENTYL) 10 MG capsule TAKE 1 CAPSULE BY MOUTH 3 TIMES A DAY ASNEEDED FOR SPASMS. TAKE MEDICATION WITH MEALS   esomeprazole (NEXIUM) 40 MG capsule TAKE 1 CAPSULE BY MOUTH DAILY   estradiol (ESTRACE) 0.1 MG/GM vaginal cream Apply 1 gram per vagina every night for 2 weeks, then apply three times a week   etodolac (LODINE) 400 MG tablet Take 1 tablet (400 mg total) by mouth 2 (two) times daily as needed for moderate pain.   fluticasone (FLONASE) 50 MCG/ACT nasal spray Place 2 sprays into both nostrils daily.   furosemide (LASIX) 40 MG tablet TAKE 1 TABLET BY MOUTH DAILY AS NEEDED FOR FLUID   glucosamine-chondroitin 500-400 MG tablet Take 2 tablets by mouth daily.   ketoconazole (NIZORAL) 2 % shampoo Apply 1 application topically 2 (two) times a week.   LINZESS 290 MCG CAPS capsule TAKE 1 CAPSULE BY MOUTH DAILY BEFORE BREAKFAST   methocarbamol (ROBAXIN) 750 MG tablet TAKE 1 TABLET BY MOUTH TWICE A DAY AS NEEDED FOR MUSCLE SPASMS   MISC NATURAL PRODUCTS PO Take by mouth. Keto diet supplement pills   NEOMYCIN-POLYMYXIN-HYDROCORTISONE (CORTISPORIN) 1 % SOLN OTIC solution PLACE  3 DROPS INTO THE LEFT EAR 3 TIMES A DAY AS DIRECTED   nitrofurantoin (MACRODANTIN) 100 MG capsule Take 1 capsule (100 mg total) by mouth daily.   nystatin cream (MYCOSTATIN) Apply 1 application topically 2 (two) times daily.   omega-3 acid ethyl esters (LOVAZA) 1 g capsule Take 2 capsules (2 g total) by mouth daily.   OVER THE COUNTER MEDICATION Take 2 tablets by mouth as needed. IBgard   oxyCODONE-acetaminophen (PERCOCET) 7.5-325 MG tablet TAKE 1 TABLET BY MOUTH EVERY 8 HOURS AS NEEDED FOR PAIN   potassium chloride (KLOR-CON) 10 MEQ tablet  TAKE 1 TABLET BY MOUTH DAILY   promethazine (PHENERGAN) 12.5 MG tablet Take 1 tablet (12.5 mg total) by mouth every 8 (eight) hours as needed for nausea.   SPIRIVA HANDIHALER 18 MCG inhalation capsule INHALE ONE CAPSULE AS DIRECTED ONCE A DAY   tiZANidine (ZANAFLEX) 4 MG tablet Take 1 tablet (4 mg total) by mouth 2 (two) times daily as needed for muscle spasms.   trimethoprim-polymyxin b (POLYTRIM) ophthalmic solution Place 1 drop into the left eye every 6 (six) hours.   No facility-administered encounter medications on file as of 11/13/2020.   11/13/2020 no answer, 11/21/2020 mailbox full, 11/25/2020 constant busy signal. Multiple attempts to reach the patient unsuccessful.  Current COPD regimen: Advair Diskus 250-50 mcg/dose 1 puff 2 times daily   Any recent hospitalizations or ED visits since last visit with CPP?  Yes  10/30/2020 Dr[facility] [address] [facility] ED  Started Acyclovir 400mg  take 1 tablet every 4 hours while awake.   10/08/2020 Dr.Zachary [facility] ED No medication changes. 07/08/2020  Dr.Gary 07/10/2020 ED Started Zovirax 400mg  take 1 tablet every 4 hours while awake 06/27/2020  Dr.Eric [facility] ED Started Keflex 500mg  take 1 capsule 3 times a day for 10 days  Star Rating Drugs: Drug Name  Last Fill Date Day Supply Atorvastatin 10mg  06/28/2020 90ds  > 5 day gap in adherence   Follow-Up:  Pharmacist Review  [facility], CPP notified  , [facility] Clinical Pharmacy Assistant [phone]  I have reviewed the care management and care coordination activities outlined in this encounter and I am certifying that I agree with the content of this note. Will attempt to reconnect again next month.  Phil Dopp, PharmD Clinical Pharmacist [facility] at [facility] [phone]

## 2020-12-03 ENCOUNTER — Other Ambulatory Visit: Payer: Self-pay | Admitting: Family Medicine

## 2020-12-03 DIAGNOSIS — T148XXA Other injury of unspecified body region, initial encounter: Secondary | ICD-10-CM | POA: Diagnosis not present

## 2020-12-03 DIAGNOSIS — L989 Disorder of the skin and subcutaneous tissue, unspecified: Secondary | ICD-10-CM | POA: Diagnosis not present

## 2020-12-03 DIAGNOSIS — L089 Local infection of the skin and subcutaneous tissue, unspecified: Secondary | ICD-10-CM | POA: Diagnosis not present

## 2020-12-04 ENCOUNTER — Ambulatory Visit: Payer: Medicare Other | Admitting: Family Medicine

## 2020-12-04 NOTE — Telephone Encounter (Signed)
ERx 

## 2020-12-04 NOTE — Telephone Encounter (Signed)
Name of Medication: Alprazolam, Diazepam, Oxycodone-APAP Name of Pharmacy: [facility] Last [place] or Written Date and Quantity: 11/08/20      Alprazolam- #10      Diazepam- #60      Oxycodone-APAP- #90 Last Office Visit and Type: 09/06/20, 3 mo f/u Next Office Visit and Type: 12/10/20, 3 mo f/u Last Controlled Substance Agreement Date: 09/06/20 Last UDS: 09/06/20  Celexa last filled:  11/07/20, #9 Bentyl last filled:  11/07/20, #60

## 2020-12-10 ENCOUNTER — Other Ambulatory Visit: Payer: Self-pay

## 2020-12-10 ENCOUNTER — Encounter: Payer: Self-pay | Admitting: Family Medicine

## 2020-12-10 ENCOUNTER — Telehealth: Payer: Self-pay | Admitting: Family Medicine

## 2020-12-10 ENCOUNTER — Ambulatory Visit (INDEPENDENT_AMBULATORY_CARE_PROVIDER_SITE_OTHER): Payer: Medicare Other | Admitting: Family Medicine

## 2020-12-10 DIAGNOSIS — R634 Abnormal weight loss: Secondary | ICD-10-CM | POA: Insufficient documentation

## 2020-12-10 DIAGNOSIS — Z7289 Other problems related to lifestyle: Secondary | ICD-10-CM

## 2020-12-10 DIAGNOSIS — Z789 Other specified health status: Secondary | ICD-10-CM

## 2020-12-10 DIAGNOSIS — J432 Centrilobular emphysema: Secondary | ICD-10-CM | POA: Diagnosis not present

## 2020-12-10 DIAGNOSIS — L989 Disorder of the skin and subcutaneous tissue, unspecified: Secondary | ICD-10-CM | POA: Diagnosis not present

## 2020-12-10 DIAGNOSIS — E669 Obesity, unspecified: Secondary | ICD-10-CM | POA: Diagnosis not present

## 2020-12-10 DIAGNOSIS — G894 Chronic pain syndrome: Secondary | ICD-10-CM | POA: Diagnosis not present

## 2020-12-10 DIAGNOSIS — F319 Bipolar disorder, unspecified: Secondary | ICD-10-CM

## 2020-12-10 DIAGNOSIS — G8929 Other chronic pain: Secondary | ICD-10-CM | POA: Diagnosis not present

## 2020-12-10 MED ORDER — OMEGA-3-ACID ETHYL ESTERS 1 G PO CAPS: 2.0000 g | ORAL_CAPSULE | Freq: Every day | ORAL | 3 refills | Status: DC

## 2020-12-10 MED ORDER — PROMETHAZINE HCL 12.5 MG PO TABS: 12.5000 mg | ORAL_TABLET | Freq: Three times a day (TID) | ORAL | 0 refills | Status: DC | PRN

## 2020-12-10 MED ORDER — FLUTICASONE-SALMETEROL 250-50 MCG/ACT IN AEPB: 1.0000 | INHALATION_SPRAY | Freq: Two times a day (BID) | RESPIRATORY_TRACT | 3 refills | Status: DC

## 2020-12-10 NOTE — Assessment & Plan Note (Signed)
Stable period on abilify, celexa, benzo.

## 2020-12-10 NOTE — Telephone Encounter (Signed)
Spoke with pt relaying Dr. Timoteo Expose message.  Pt states she had it done Jan or Feb 2022 about the same time of lung CA screening.    Faxed records request to [phone].

## 2020-12-10 NOTE — Assessment & Plan Note (Addendum)
See above. Pt attributes to malaise, loss of appetite from worm infection. Currently on ivermectin. Advised let me know if ongoing weight loss.  Overdue for mammogram - will ask her to call and schedule.

## 2020-12-10 NOTE — Telephone Encounter (Signed)
plz have her call and schedule mammogram as she's overdue  [facility] at [facility] [phone]

## 2020-12-10 NOTE — Assessment & Plan Note (Signed)
Confirmed on latest lung cancer screening CT Discussed spiriva and advair use.

## 2020-12-10 NOTE — Patient Instructions (Addendum)
Medicines refilled today (advair, lovaza).  Let us know if ongoing weight loss.  Return in 3 months for follow up visit.

## 2020-12-10 NOTE — Assessment & Plan Note (Signed)
Discussed would anticipate improvement in pain control with weight loss.

## 2020-12-10 NOTE — Assessment & Plan Note (Addendum)
Marked weight loss noted - she attributes to feeling ill from parasites. Advised to let me know right away if any further weight loss happens. She has since gained 10 lbs back (endorses total of 40 lb weight loss).
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
## 2020-12-10 NOTE — Assessment & Plan Note (Signed)
Largely abstinent since 05/2020

## 2020-12-10 NOTE — Assessment & Plan Note (Signed)
These have largely resolved.  However she has continued concern for parasite infestation, states she's seen worms in stool. Currently on another ivermectin treatment through [facility].  Advised to let me know if ongoing concern for worms for stool O&P.

## 2020-12-16 ENCOUNTER — Telehealth: Payer: Self-pay

## 2020-12-16 NOTE — Chronic Care Management (AMB) (Addendum)
Chronic Care Management Pharmacy Assistant   Name: Kaylee [name]  MRN: [id number] DOB: 08-23-1960  Reason for Encounter: Adherence    Recent office visits:  12/10/20 - Dr.Gutierrez PCP - Medicines refilled today (advair, lovaza). Let us know if ongoing weight loss. Return in 3 months for follow up visit.   Recent consult visits:  12/03/20 - Dermatology - Start Ivermectin and [facility] visits:  None in previous 6 months  Medications: Outpatient Encounter Medications as of 12/16/2020  Medication Sig   acyclovir (ZOVIRAX) 400 MG tablet TAKE 1 TABLET BY MOUTH TWICE A DAY   albuterol (VENTOLIN HFA) 108 (90 Base) MCG/ACT inhaler INHALE 2 PUFFS INTO THE LUNGS EVERY 4 HOURS AS NEEDED.   allopurinol (ZYLOPRIM) 100 MG tablet TAKE 1 TABLET BY MOUTH DAILY   Alpha-Lipoic Acid 600 MG CAPS Take 1 capsule (600 mg total) by mouth daily.   ALPRAZolam (XANAX) 1 MG tablet TAKE 1 TABLET BY MOUTH DAILY AS NEEDED FOR ANXIETY   ARIPiprazole (ABILIFY) 5 MG tablet Take 1 tablet (5 mg total) by mouth daily.   atorvastatin (LIPITOR) 10 MG tablet TAKE 1 TABLET BY MOUTH DAILY   cephALEXin (KEFLEX) 500 MG capsule Take 500 mg by mouth 3 (three) times daily.   citalopram (CELEXA) 10 MG tablet TAKE 1 TABLET BY MOUTH DAILY   colchicine 0.6 MG tablet TAKE 1 TABLET BY MOUTH DAILY AS NEEDED.   diazepam (VALIUM) 10 MG tablet TAKE 1 TABLET BY MOUTH TWICE A DAY   dicyclomine (BENTYL) 10 MG capsule TAKE 1 CAPSULE BY MOUTH 3 TIMES A DAY ASNEEDED FOR SPASMS. TAKE MEDICATION WITH MEALS   esomeprazole (NEXIUM) 40 MG capsule TAKE 1 CAPSULE BY MOUTH DAILY   etodolac (LODINE) 400 MG tablet Take 1 tablet (400 mg total) by mouth 2 (two) times daily as needed for moderate pain.   fluticasone (FLONASE) 50 MCG/ACT nasal spray Place 2 sprays into both nostrils daily.   fluticasone-salmeterol (ADVAIR DISKUS) 250-50 MCG/ACT AEPB Inhale 1 puff into the lungs in the morning and at bedtime.   furosemide (LASIX) 40 MG tablet TAKE  1 TABLET BY MOUTH DAILY AS NEEDED FOR FLUID   ivermectin (STROMECTOL) 3 MG TABS tablet Take 6 tablets (18mg ) by mouth once. Then 7 days later take another 6 tablets (18mg ) by mouth.   LINZESS 290 MCG CAPS capsule TAKE 1 CAPSULE BY MOUTH DAILY BEFORE BREAKFAST   methocarbamol (ROBAXIN) 750 MG tablet TAKE 1 TABLET BY MOUTH TWICE A DAY AS NEEDED FOR MUSCLE SPASMS   MISC NATURAL PRODUCTS PO Take by mouth. Keto diet supplement pills   mupirocin ointment (BACTROBAN) 2 % Apply twice daily to open wounds   NEOMYCIN-POLYMYXIN-HYDROCORTISONE (CORTISPORIN) 1 % SOLN OTIC solution PLACE 3 DROPS INTO THE LEFT EAR 3 TIMES A DAY AS DIRECTED   nitrofurantoin (MACRODANTIN) 100 MG capsule Take 1 capsule (100 mg total) by mouth daily.   nystatin cream (MYCOSTATIN) Apply 1 application topically 2 (two) times daily.   omega-3 acid ethyl esters (LOVAZA) 1 g capsule Take 2 capsules (2 g total) by mouth daily.   OVER THE COUNTER MEDICATION Take 2 tablets by mouth as needed. IBgard   oxyCODONE-acetaminophen (PERCOCET) 7.5-325 MG tablet TAKE 1 TABLET BY MOUTH EVERY 8 HOURS AS NEEDED FOR PAIN   potassium chloride (KLOR-CON) 10 MEQ tablet TAKE 1 TABLET BY MOUTH DAILY   promethazine (PHENERGAN) 12.5 MG tablet Take 1 tablet (12.5 mg total) by mouth every 8 (eight) hours as needed for nausea.  SPIRIVA HANDIHALER 18 MCG inhalation capsule INHALE ONE CAPSULE AS DIRECTED ONCE A DAY   tiZANidine (ZANAFLEX) 4 MG tablet Take 1 tablet (4 mg total) by mouth 2 (two) times daily as needed for muscle spasms.   trimethoprim-polymyxin b (POLYTRIM) ophthalmic solution Place 1 drop into the left eye every 6 (six) hours.   No facility-administered encounter medications on file as of 12/16/2020.    Contacted Kaylee [name] for general disease state and medication adherence call.   Patient is not > 5 days past due for refill on the following medications per chart history:  [facility] Medications: Medication Name/mg Last Fill Days  Supply Atorvastatin 10mg    10/03/20 90   What concerns do you have about your medications? the patient states she is doing very well with her medications now   The patient denies side effects with her medications.   How often do you forget or accidentally miss a dose? Never  Do you use a pillbox? No the patient states she has a plastic bag that she puts daily medications in and takes them every morning  Are you having any problems getting your medications from your pharmacy? No  Has the cost of your medications been a concern? No  Since last visit with CPP, the following interventions have been made: weight loss of 40 lbs with walking program and choosing healthy foods  The patient has had an ED visit since last contact. Yes 10/30/20-[facility] delusions        10/08/20-[facility] rash-start Ivermectin 3mg  take 6 tablets at one time for 1 dose       07/08/20-[facility] delusions       06/27/20-[facility] dermatology-started Keflex oral take full course         03/31/20-[facility] abcess -start Acyclovir 400mg  take 1 tablet every 4 hours while awake.   The patient denies problems with their health.   she denies  concerns or questions for 06/29/20, Pharm. D at this time.   Counseled patient on:   Importance of taking medication daily without missed doses  Benefits of adherence packaging or a pillbox  Access to CCM team for any cost, medication, or pharmacy concerns   No appointments scheduled within the next 30 days.  04/02/20, CPP notified  , Kaylee [name] Clinical Pharmacy Assistant [phone] I have reviewed the care management and care coordination activities outlined in this encounter and I am certifying that I agree with the content of this note. No further action required.  Burt Knack, PharmD Clinical Pharmacist [name] [phone]

## 2021-01-06 ENCOUNTER — Other Ambulatory Visit: Payer: Self-pay | Admitting: Family Medicine

## 2021-01-07 NOTE — Telephone Encounter (Signed)
ERx 

## 2021-01-08 ENCOUNTER — Other Ambulatory Visit: Payer: Self-pay | Admitting: Family Medicine

## 2021-01-08 NOTE — Telephone Encounter (Signed)
Victorino Dike called in and wanted to know if Dr. Reece Agar will write the name brand for advair due to the patient stated that the generic brand messes up the machine she uses. And wanted to know if Dr. Reece Agar would write for the name brand only.

## 2021-01-08 NOTE — Telephone Encounter (Signed)
Polytrim last filled:  12/20/20, #10 mL Cortisporin 1% optic last filled:  12/20/20, #10 mL Macrodantin last filled:  11/08/20, #90 Etodolac last filled:  12/05/20, #60 Abilify last filled:  12/05/20, #30 Last OV:  12/10/20, chronic pain f/u Next OV:  03/12/21, 3 mo f/u

## 2021-01-09 ENCOUNTER — Other Ambulatory Visit: Payer: Self-pay | Admitting: Family Medicine

## 2021-01-10 NOTE — Telephone Encounter (Signed)
Note from pharmacy:  WE HAVE A NEW RX FOR GENERIC ADVAIR. PATIENT REQUESTS BRAND NAME ADVAIR DAW 1 PLEASE.

## 2021-01-15 ENCOUNTER — Inpatient Hospital Stay
Admission: EM | Admit: 2021-01-15 | Discharge: 2021-02-04 | DRG: 853 | Disposition: A | Discharge status: Home / Self Care | Payer: Medicare Other | Attending: Internal Medicine | Admitting: Internal Medicine

## 2021-01-15 ENCOUNTER — Emergency Department: Payer: Medicare Other

## 2021-01-15 ENCOUNTER — Other Ambulatory Visit: Payer: Self-pay

## 2021-01-15 ENCOUNTER — Encounter: Payer: Self-pay | Admitting: Emergency Medicine

## 2021-01-15 DIAGNOSIS — E785 Hyperlipidemia, unspecified: Secondary | ICD-10-CM | POA: Diagnosis present

## 2021-01-15 DIAGNOSIS — T402X5A Adverse effect of other opioids, initial encounter: Secondary | ICD-10-CM | POA: Diagnosis present

## 2021-01-15 DIAGNOSIS — R509 Fever, unspecified: Secondary | ICD-10-CM | POA: Diagnosis not present

## 2021-01-15 DIAGNOSIS — A419 Sepsis, unspecified organism: Secondary | ICD-10-CM | POA: Diagnosis not present

## 2021-01-15 DIAGNOSIS — L03116 Cellulitis of left lower limb: Secondary | ICD-10-CM | POA: Diagnosis present

## 2021-01-15 DIAGNOSIS — Z881 Allergy status to other antibiotic agents status: Secondary | ICD-10-CM

## 2021-01-15 DIAGNOSIS — Z91419 Personal history of unspecified adult abuse: Secondary | ICD-10-CM

## 2021-01-15 DIAGNOSIS — M726 Necrotizing fasciitis: Secondary | ICD-10-CM

## 2021-01-15 DIAGNOSIS — L03115 Cellulitis of right lower limb: Secondary | ICD-10-CM | POA: Diagnosis not present

## 2021-01-15 DIAGNOSIS — K589 Irritable bowel syndrome without diarrhea: Secondary | ICD-10-CM | POA: Diagnosis present

## 2021-01-15 DIAGNOSIS — B9562 Methicillin resistant Staphylococcus aureus infection as the cause of diseases classified elsewhere: Secondary | ICD-10-CM | POA: Diagnosis present

## 2021-01-15 DIAGNOSIS — K219 Gastro-esophageal reflux disease without esophagitis: Secondary | ICD-10-CM | POA: Diagnosis present

## 2021-01-15 DIAGNOSIS — D75838 Other thrombocytosis: Secondary | ICD-10-CM | POA: Diagnosis present

## 2021-01-15 DIAGNOSIS — Z888 Allergy status to other drugs, medicaments and biological substances status: Secondary | ICD-10-CM | POA: Diagnosis not present

## 2021-01-15 DIAGNOSIS — F22 Delusional disorders: Secondary | ICD-10-CM

## 2021-01-15 DIAGNOSIS — E871 Hypo-osmolality and hyponatremia: Secondary | ICD-10-CM | POA: Diagnosis not present

## 2021-01-15 DIAGNOSIS — R6889 Other general symptoms and signs: Secondary | ICD-10-CM | POA: Diagnosis not present

## 2021-01-15 DIAGNOSIS — R609 Edema, unspecified: Secondary | ICD-10-CM | POA: Diagnosis not present

## 2021-01-15 DIAGNOSIS — R52 Pain, unspecified: Secondary | ICD-10-CM | POA: Diagnosis not present

## 2021-01-15 DIAGNOSIS — Z743 Need for continuous supervision: Secondary | ICD-10-CM | POA: Diagnosis not present

## 2021-01-15 DIAGNOSIS — D75839 Thrombocytosis, unspecified: Secondary | ICD-10-CM | POA: Diagnosis not present

## 2021-01-15 DIAGNOSIS — M0600 Rheumatoid arthritis without rheumatoid factor, unspecified site: Secondary | ICD-10-CM | POA: Diagnosis present

## 2021-01-15 DIAGNOSIS — D500 Iron deficiency anemia secondary to blood loss (chronic): Secondary | ICD-10-CM | POA: Diagnosis present

## 2021-01-15 DIAGNOSIS — J432 Centrilobular emphysema: Secondary | ICD-10-CM | POA: Diagnosis not present

## 2021-01-15 DIAGNOSIS — Z90710 Acquired absence of both cervix and uterus: Secondary | ICD-10-CM

## 2021-01-15 DIAGNOSIS — G894 Chronic pain syndrome: Secondary | ICD-10-CM | POA: Diagnosis present

## 2021-01-15 DIAGNOSIS — Z7951 Long term (current) use of inhaled steroids: Secondary | ICD-10-CM

## 2021-01-15 DIAGNOSIS — R9431 Abnormal electrocardiogram [ECG] [EKG]: Secondary | ICD-10-CM | POA: Diagnosis not present

## 2021-01-15 DIAGNOSIS — J449 Chronic obstructive pulmonary disease, unspecified: Secondary | ICD-10-CM | POA: Diagnosis present

## 2021-01-15 DIAGNOSIS — E119 Type 2 diabetes mellitus without complications: Secondary | ICD-10-CM | POA: Diagnosis present

## 2021-01-15 DIAGNOSIS — Z885 Allergy status to narcotic agent status: Secondary | ICD-10-CM

## 2021-01-15 DIAGNOSIS — F1414 Cocaine abuse with cocaine-induced mood disorder: Secondary | ICD-10-CM | POA: Diagnosis present

## 2021-01-15 DIAGNOSIS — F419 Anxiety disorder, unspecified: Secondary | ICD-10-CM | POA: Diagnosis present

## 2021-01-15 DIAGNOSIS — E872 Acidosis: Secondary | ICD-10-CM | POA: Diagnosis not present

## 2021-01-15 DIAGNOSIS — Z882 Allergy status to sulfonamides status: Secondary | ICD-10-CM | POA: Diagnosis not present

## 2021-01-15 DIAGNOSIS — Z8614 Personal history of Methicillin resistant Staphylococcus aureus infection: Secondary | ICD-10-CM

## 2021-01-15 DIAGNOSIS — F3130 Bipolar disorder, current episode depressed, mild or moderate severity, unspecified: Secondary | ICD-10-CM | POA: Diagnosis present

## 2021-01-15 DIAGNOSIS — Z4801 Encounter for change or removal of surgical wound dressing: Secondary | ICD-10-CM | POA: Diagnosis not present

## 2021-01-15 DIAGNOSIS — D72828 Other elevated white blood cell count: Secondary | ICD-10-CM | POA: Diagnosis not present

## 2021-01-15 DIAGNOSIS — Z87891 Personal history of nicotine dependence: Secondary | ICD-10-CM | POA: Diagnosis not present

## 2021-01-15 DIAGNOSIS — M79604 Pain in right leg: Secondary | ICD-10-CM | POA: Diagnosis not present

## 2021-01-15 DIAGNOSIS — R600 Localized edema: Secondary | ICD-10-CM | POA: Diagnosis not present

## 2021-01-15 DIAGNOSIS — F1510 Other stimulant abuse, uncomplicated: Secondary | ICD-10-CM | POA: Diagnosis present

## 2021-01-15 DIAGNOSIS — K5903 Drug induced constipation: Secondary | ICD-10-CM | POA: Diagnosis present

## 2021-01-15 DIAGNOSIS — Z79899 Other long term (current) drug therapy: Secondary | ICD-10-CM

## 2021-01-15 DIAGNOSIS — Z8619 Personal history of other infectious and parasitic diseases: Secondary | ICD-10-CM | POA: Diagnosis not present

## 2021-01-15 DIAGNOSIS — E876 Hypokalemia: Secondary | ICD-10-CM | POA: Diagnosis not present

## 2021-01-15 DIAGNOSIS — Z87440 Personal history of urinary (tract) infections: Secondary | ICD-10-CM | POA: Diagnosis not present

## 2021-01-15 DIAGNOSIS — M7989 Other specified soft tissue disorders: Secondary | ICD-10-CM | POA: Diagnosis not present

## 2021-01-15 DIAGNOSIS — Z20822 Contact with and (suspected) exposure to covid-19: Secondary | ICD-10-CM | POA: Diagnosis present

## 2021-01-15 DIAGNOSIS — Z87410 Personal history of cervical dysplasia: Secondary | ICD-10-CM

## 2021-01-15 DIAGNOSIS — T8189XA Other complications of procedures, not elsewhere classified, initial encounter: Secondary | ICD-10-CM | POA: Diagnosis not present

## 2021-01-15 DIAGNOSIS — F319 Bipolar disorder, unspecified: Secondary | ICD-10-CM | POA: Diagnosis present

## 2021-01-15 DIAGNOSIS — S81829A Laceration with foreign body, unspecified lower leg, initial encounter: Secondary | ICD-10-CM | POA: Diagnosis not present

## 2021-01-15 LAB — COMPREHENSIVE METABOLIC PANEL
ALT: 14 U/L (ref 0–44)
AST: 17 U/L (ref 15–41)
Albumin: 2.6 g/dL — ABNORMAL LOW (ref 3.5–5.0)
Alkaline Phosphatase: 114 U/L (ref 38–126)
Anion gap: 9 (ref 5–15)
BUN: 18 mg/dL (ref 8–23)
CO2: 24 mmol/L (ref 22–32)
Calcium: 9 mg/dL (ref 8.9–10.3)
Chloride: 98 mmol/L (ref 98–111)
Creatinine, Ser: 0.89 mg/dL (ref 0.44–1.00)
GFR, Estimated: >60 mL/min (ref >60)
Glucose, Bld: 132 mg/dL — ABNORMAL HIGH (ref 70–99)
Potassium: 3 mmol/L — ABNORMAL LOW (ref 3.5–5.1)
Sodium: 131 mmol/L — ABNORMAL LOW (ref 135–145)
Total Bilirubin: 0.5 mg/dL (ref 0.3–1.2)
Total Protein: 6.4 g/dL — ABNORMAL LOW (ref 6.5–8.1)

## 2021-01-15 LAB — CBC WITH DIFFERENTIAL/PLATELET
Abs Immature Granulocytes: 0.25 10*3/uL — ABNORMAL HIGH (ref 0.00–0.07)
Basophils Absolute: 0.1 10*3/uL (ref 0.0–0.1)
Basophils Relative: 1 %
Eosinophils Absolute: 0.1 10*3/uL (ref 0.0–0.5)
Eosinophils Relative: 0 %
HCT: 36.9 % (ref 36.0–46.0)
Hemoglobin: 12.8 g/dL (ref 12.0–15.0)
Immature Granulocytes: 1 %
Lymphocytes Relative: 7 %
Lymphs Abs: 1.8 10*3/uL (ref 0.7–4.0)
MCH: 30.2 pg (ref 26.0–34.0)
MCHC: 34.7 g/dL (ref 30.0–36.0)
MCV: 87 fL (ref 80.0–100.0)
Monocytes Absolute: 1.4 10*3/uL — ABNORMAL HIGH (ref 0.1–1.0)
Monocytes Relative: 5 %
Neutro Abs: 22.9 10*3/uL — ABNORMAL HIGH (ref 1.7–7.7)
Neutrophils Relative %: 86 %
Platelets: 266 10*3/uL (ref 150–400)
RBC: 4.24 MIL/uL (ref 3.87–5.11)
RDW: 13.9 % (ref 11.5–15.5)
Smear Review: NORMAL
WBC: 26.3 10*3/uL — ABNORMAL HIGH (ref 4.0–10.5)
nRBC: 0 % (ref 0.0–0.2)

## 2021-01-15 LAB — URINALYSIS, COMPLETE (UACMP) WITH MICROSCOPIC
Bilirubin Urine: NEGATIVE
Glucose, UA: NEGATIVE mg/dL
Ketones, ur: NEGATIVE mg/dL
Nitrite: NEGATIVE
Protein, ur: NEGATIVE mg/dL
Specific Gravity, Urine: 1.004 — ABNORMAL LOW (ref 1.005–1.030)
Squamous Epithelial / HPF: NONE SEEN (ref 0–5)
pH: 5 (ref 5.0–8.0)

## 2021-01-15 LAB — APTT: aPTT: 35 seconds (ref 24–36)

## 2021-01-15 LAB — PROTIME-INR
INR: 1 (ref 0.8–1.2)
Prothrombin Time: 13.7 seconds (ref 11.4–15.2)

## 2021-01-15 LAB — RESP PANEL BY RT-PCR (FLU A&B, COVID) ARPGX2
Influenza A by PCR: NEGATIVE
Influenza B by PCR: NEGATIVE
SARS Coronavirus 2 by RT PCR: NEGATIVE

## 2021-01-15 LAB — LACTIC ACID, PLASMA
Lactic Acid, Venous: 1.8 mmol/L (ref 0.5–1.9)
Lactic Acid, Venous: 2.7 mmol/L (ref 0.5–1.9)

## 2021-01-15 MED ORDER — SODIUM CHLORIDE 0.9 % IV BOLUS (SEPSIS)
1000.0000 mL | Freq: Once | INTRAVENOUS | Status: AC
  Administered 2021-01-15: 1000 mL via INTRAVENOUS

## 2021-01-15 MED ORDER — ONDANSETRON HCL 4 MG/2ML IJ SOLN
4.0000 mg | Freq: Once | INTRAMUSCULAR | Status: AC
  Administered 2021-01-15: 4 mg via INTRAVENOUS
  Filled 2021-01-15: qty 2

## 2021-01-15 MED ORDER — ACETAMINOPHEN 650 MG RE SUPP: 650.0000 mg | Freq: Four times a day (QID) | RECTAL | Status: DC | PRN

## 2021-01-15 MED ORDER — VANCOMYCIN HCL IN DEXTROSE 1-5 GM/200ML-% IV SOLN: 1000.0000 mg | Freq: Once | INTRAVENOUS | Status: DC

## 2021-01-15 MED ORDER — SODIUM CHLORIDE 0.9 % IV SOLN
2.0000 g | Freq: Once | INTRAVENOUS | Status: AC
  Administered 2021-01-15: 2 g via INTRAVENOUS
  Filled 2021-01-15: qty 20

## 2021-01-15 MED ORDER — ENOXAPARIN SODIUM 40 MG/0.4ML IJ SOSY
40.0000 mg | PREFILLED_SYRINGE | INTRAMUSCULAR | Status: DC
  Administered 2021-01-16 – 2021-01-19 (×4): 40 mg via SUBCUTANEOUS
  Filled 2021-01-15 (×4): qty 0.4

## 2021-01-15 MED ORDER — LACTATED RINGERS IV SOLN: INTRAVENOUS | Status: DC

## 2021-01-15 MED ORDER — ALBUTEROL SULFATE (2.5 MG/3ML) 0.083% IN NEBU: 2.5000 mg | INHALATION_SOLUTION | RESPIRATORY_TRACT | Status: DC | PRN

## 2021-01-15 MED ORDER — HYDROCODONE-ACETAMINOPHEN 5-325 MG PO TABS
1.0000 | ORAL_TABLET | ORAL | Status: DC | PRN
  Administered 2021-01-16 – 2021-01-20 (×9): 2 via ORAL
  Filled 2021-01-15 (×10): qty 2

## 2021-01-15 MED ORDER — VANCOMYCIN HCL IN DEXTROSE 1-5 GM/200ML-% IV SOLN
1000.0000 mg | INTRAVENOUS | Status: DC
  Filled 2021-01-15: qty 200

## 2021-01-15 MED ORDER — ONDANSETRON HCL 4 MG PO TABS: 4.0000 mg | ORAL_TABLET | Freq: Four times a day (QID) | ORAL | Status: DC | PRN

## 2021-01-15 MED ORDER — VANCOMYCIN HCL 1500 MG/300ML IV SOLN
1500.0000 mg | Freq: Once | INTRAVENOUS | Status: AC
  Administered 2021-01-15: 1500 mg via INTRAVENOUS
  Filled 2021-01-15: qty 300

## 2021-01-15 MED ORDER — ACETAMINOPHEN 325 MG PO TABS
650.0000 mg | ORAL_TABLET | Freq: Four times a day (QID) | ORAL | Status: DC | PRN
  Administered 2021-01-16 – 2021-01-31 (×3): 650 mg via ORAL
  Filled 2021-01-15 (×3): qty 2

## 2021-01-15 MED ORDER — ONDANSETRON HCL 4 MG/2ML IJ SOLN
4.0000 mg | Freq: Four times a day (QID) | INTRAMUSCULAR | Status: DC | PRN
  Administered 2021-01-28: 4 mg via INTRAVENOUS

## 2021-01-15 MED ORDER — MORPHINE SULFATE (PF) 2 MG/ML IV SOLN: 2.0000 mg | INTRAVENOUS | Status: DC | PRN

## 2021-01-15 MED ORDER — FENTANYL CITRATE (PF) 100 MCG/2ML IJ SOLN
100.0000 ug | Freq: Once | INTRAMUSCULAR | Status: AC
Start: 2021-01-15 — End: 2021-01-15
  Administered 2021-01-15: 100 ug via INTRAVENOUS
  Filled 2021-01-15: qty 2

## 2021-01-15 MED ORDER — SENNOSIDES-DOCUSATE SODIUM 8.6-50 MG PO TABS: 1.0000 | ORAL_TABLET | Freq: Every evening | ORAL | Status: DC | PRN

## 2021-01-15 MED ORDER — SODIUM CHLORIDE 0.9 % IV SOLN
2.0000 g | Freq: Three times a day (TID) | INTRAVENOUS | Status: AC
  Administered 2021-01-16 – 2021-01-18 (×7): 2 g via INTRAVENOUS
  Filled 2021-01-15 (×9): qty 2

## 2021-01-15 NOTE — Sepsis Progress Note (Signed)
eLink is monitoring this Code Sepsis. °
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
## 2021-01-15 NOTE — ED Triage Notes (Signed)
Pt comes into the ED via EMS from home with c/o swelling redness and pain of the LLE from foot all the way up to the upper thigh over the past 7 days, first 3 days had disorientation. Pt is currently Being treated for intestinal worms with ivermectin
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
## 2021-01-15 NOTE — ED Notes (Signed)
Lactic acid--2.8, MD notified and aware.

## 2021-01-15 NOTE — Consult Note (Signed)
PHARMACY -  BRIEF ANTIBIOTIC NOTE   Pharmacy has received consult(s) for vancomycin from an ED provider.  The patient's profile has been reviewed for ht/wt/allergies/indication/available labs.    One time order(s) placed for: Vancomycin 1500mg  x1 Also receiving Ceftriaxone 2g x1 Further antibiotics/pharmacy consults should be ordered by admitting physician if indicated.                       Thank you, 01/15/2021  6:24 PM

## 2021-01-15 NOTE — ED Notes (Signed)
Assisted patient to the restroom via wheelchair to obtain urine sample.

## 2021-01-15 NOTE — Consult Note (Signed)
CODE SEPSIS - PHARMACY COMMUNICATION  **Broad Spectrum Antibiotics should be administered within 1 hour of Sepsis diagnosis**  Time Code Sepsis Called/Page Received: 1817  Antibiotics Ordered: vancomycin, ceftriaxone,  Time of 1st antibiotic administration: 1853     Sharen Hones ,PharmD, BCPS Clinical Pharmacist  01/15/2021  7:05 PM

## 2021-01-15 NOTE — ED Triage Notes (Signed)
Pt comes into the ED via EMS from home c/o left lower leg swelling and cellulitis.  This has been ongoing for 7 days and per EMS for the first 3 days she also had disorientation.  PT is currently being treated to intestinal worms and ivermectin. Pt currently has even and unlabored respirations.
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
## 2021-01-16 ENCOUNTER — Inpatient Hospital Stay: Payer: Medicare Other | Admitting: Anesthesiology

## 2021-01-16 ENCOUNTER — Encounter: Payer: Self-pay | Admitting: Internal Medicine

## 2021-01-16 ENCOUNTER — Inpatient Hospital Stay: Payer: Medicare Other

## 2021-01-16 ENCOUNTER — Encounter
Admission: EM | Disposition: A | Discharge status: Home / Self Care | Payer: Self-pay | Source: Home / Self Care | Attending: Internal Medicine

## 2021-01-16 DIAGNOSIS — M726 Necrotizing fasciitis: Secondary | ICD-10-CM

## 2021-01-16 DIAGNOSIS — L03116 Cellulitis of left lower limb: Secondary | ICD-10-CM | POA: Diagnosis not present

## 2021-01-16 HISTORY — PX: INCISION AND DRAINAGE ABSCESS: SHX5864

## 2021-01-16 LAB — CBC
HCT: 31.9 % — ABNORMAL LOW (ref 36.0–46.0)
Hemoglobin: 11.4 g/dL — ABNORMAL LOW (ref 12.0–15.0)
MCH: 30.7 pg (ref 26.0–34.0)
MCHC: 35.7 g/dL (ref 30.0–36.0)
MCV: 86 fL (ref 80.0–100.0)
Platelets: 267 10*3/uL (ref 150–400)
RBC: 3.71 MIL/uL — ABNORMAL LOW (ref 3.87–5.11)
RDW: 14.2 % (ref 11.5–15.5)
WBC: 33.3 10*3/uL — ABNORMAL HIGH (ref 4.0–10.5)
nRBC: 0 % (ref 0.0–0.2)

## 2021-01-16 LAB — BASIC METABOLIC PANEL
Anion gap: 8 (ref 5–15)
BUN: 12 mg/dL (ref 8–23)
CO2: 27 mmol/L (ref 22–32)
Calcium: 8.4 mg/dL — ABNORMAL LOW (ref 8.9–10.3)
Chloride: 101 mmol/L (ref 98–111)
Creatinine, Ser: 0.73 mg/dL (ref 0.44–1.00)
GFR, Estimated: >60 mL/min (ref >60)
Glucose, Bld: 109 mg/dL — ABNORMAL HIGH (ref 70–99)
Potassium: 3.1 mmol/L — ABNORMAL LOW (ref 3.5–5.1)
Sodium: 136 mmol/L (ref 135–145)

## 2021-01-16 LAB — HIV ANTIBODY (ROUTINE TESTING W REFLEX): HIV Screen 4th Generation wRfx: NONREACTIVE

## 2021-01-16 LAB — CREATININE, SERUM
Creatinine, Ser: 0.89 mg/dL (ref 0.44–1.00)
GFR, Estimated: >60 mL/min (ref >60)

## 2021-01-16 LAB — LACTIC ACID, PLASMA: Lactic Acid, Venous: 2.8 mmol/L (ref 0.5–1.9)

## 2021-01-16 LAB — GLUCOSE, CAPILLARY: Glucose-Capillary: 186 mg/dL — ABNORMAL HIGH (ref 70–99)

## 2021-01-16 SURGERY — INCISION AND DRAINAGE, ABSCESS
Anesthesia: General | Laterality: Left

## 2021-01-16 MED ORDER — CLINDAMYCIN PHOSPHATE 900 MG/50ML IV SOLN
INTRAVENOUS | Status: AC
  Administered 2021-01-16: 900 mg via INTRAVENOUS
  Filled 2021-01-16: qty 50

## 2021-01-16 MED ORDER — DEXAMETHASONE SODIUM PHOSPHATE 10 MG/ML IJ SOLN
INTRAMUSCULAR | Status: AC
  Filled 2021-01-16: qty 1

## 2021-01-16 MED ORDER — KETAMINE HCL 10 MG/ML IJ SOLN
INTRAMUSCULAR | Status: DC | PRN
  Administered 2021-01-16: 50 mg via INTRAVENOUS

## 2021-01-16 MED ORDER — POTASSIUM CHLORIDE CRYS ER 20 MEQ PO TBCR
40.0000 meq | EXTENDED_RELEASE_TABLET | Freq: Once | ORAL | Status: AC
  Administered 2021-01-16: 40 meq via ORAL
  Filled 2021-01-16: qty 2

## 2021-01-16 MED ORDER — FENTANYL CITRATE (PF) 100 MCG/2ML IJ SOLN
50.0000 ug | INTRAMUSCULAR | Status: DC | PRN
  Administered 2021-01-18 – 2021-01-20 (×3): 50 ug via INTRAVENOUS
  Filled 2021-01-16 (×3): qty 2

## 2021-01-16 MED ORDER — FENTANYL CITRATE (PF) 100 MCG/2ML IJ SOLN
INTRAMUSCULAR | Status: AC
  Administered 2021-01-16: 50 ug via INTRAVENOUS
  Filled 2021-01-16: qty 2

## 2021-01-16 MED ORDER — DEXAMETHASONE SODIUM PHOSPHATE 10 MG/ML IJ SOLN
INTRAMUSCULAR | Status: DC | PRN
  Administered 2021-01-16: 10 mg via INTRAVENOUS

## 2021-01-16 MED ORDER — KETAMINE HCL 50 MG/5ML IJ SOSY
PREFILLED_SYRINGE | INTRAMUSCULAR | Status: AC
  Filled 2021-01-16: qty 5

## 2021-01-16 MED ORDER — DAKINS (1/4 STRENGTH) 0.125 % EX SOLN
CUTANEOUS | Status: DC | PRN
  Administered 2021-01-16: 1

## 2021-01-16 MED ORDER — CITALOPRAM HYDROBROMIDE 10 MG PO TABS
10.0000 mg | ORAL_TABLET | Freq: Every day | ORAL | Status: DC
  Administered 2021-01-17: 10 mg via ORAL
  Filled 2021-01-16: qty 1

## 2021-01-16 MED ORDER — LACTATED RINGERS IV SOLN: INTRAVENOUS | Status: DC

## 2021-01-16 MED ORDER — DAKINS (1/4 STRENGTH) 0.125 % EX SOLN
CUTANEOUS | Status: AC
  Filled 2021-01-16: qty 473

## 2021-01-16 MED ORDER — CLINDAMYCIN PHOSPHATE 900 MG/50ML IV SOLN
900.0000 mg | Freq: Three times a day (TID) | INTRAVENOUS | Status: DC
  Administered 2021-01-16 – 2021-01-20 (×10): 900 mg via INTRAVENOUS
  Filled 2021-01-16 (×16): qty 50

## 2021-01-16 MED ORDER — FENTANYL CITRATE (PF) 100 MCG/2ML IJ SOLN: 50.0000 ug | Freq: Once | INTRAMUSCULAR | Status: AC

## 2021-01-16 MED ORDER — METRONIDAZOLE 500 MG/100ML IV SOLN
500.0000 mg | Freq: Three times a day (TID) | INTRAVENOUS | Status: DC
  Filled 2021-01-16 (×3): qty 100

## 2021-01-16 MED ORDER — SUCCINYLCHOLINE CHLORIDE 20 MG/ML IJ SOLN
INTRAMUSCULAR | Status: DC | PRN
  Administered 2021-01-16: 120 mg via INTRAVENOUS

## 2021-01-16 MED ORDER — ALPRAZOLAM 0.5 MG PO TABS: 0.5000 mg | ORAL_TABLET | Freq: Three times a day (TID) | ORAL | Status: DC | PRN

## 2021-01-16 MED ORDER — LIDOCAINE HCL (PF) 2 % IJ SOLN
INTRAMUSCULAR | Status: AC
  Filled 2021-01-16: qty 5

## 2021-01-16 MED ORDER — ROCURONIUM BROMIDE 10 MG/ML (PF) SYRINGE
PREFILLED_SYRINGE | INTRAVENOUS | Status: AC
  Filled 2021-01-16: qty 10

## 2021-01-16 MED ORDER — FENTANYL CITRATE (PF) 100 MCG/2ML IJ SOLN
INTRAMUSCULAR | Status: AC
  Filled 2021-01-16: qty 2

## 2021-01-16 MED ORDER — PROPOFOL 10 MG/ML IV BOLUS
INTRAVENOUS | Status: AC
  Filled 2021-01-16: qty 20

## 2021-01-16 MED ORDER — PROPOFOL 10 MG/ML IV BOLUS
INTRAVENOUS | Status: DC | PRN
  Administered 2021-01-16: 140 mg via INTRAVENOUS

## 2021-01-16 MED ORDER — VANCOMYCIN HCL 750 MG/150ML IV SOLN
750.0000 mg | Freq: Two times a day (BID) | INTRAVENOUS | Status: AC
  Administered 2021-01-16 – 2021-01-18 (×5): 750 mg via INTRAVENOUS
  Filled 2021-01-16 (×6): qty 150

## 2021-01-16 MED ORDER — MIDAZOLAM HCL 2 MG/2ML IJ SOLN
INTRAMUSCULAR | Status: AC
  Filled 2021-01-16: qty 2

## 2021-01-16 MED ORDER — ARIPIPRAZOLE 2 MG PO TABS
5.0000 mg | ORAL_TABLET | Freq: Every day | ORAL | Status: DC
  Administered 2021-01-17: 5 mg via ORAL

## 2021-01-16 MED ORDER — ROCURONIUM BROMIDE 100 MG/10ML IV SOLN
INTRAVENOUS | Status: DC | PRN
  Administered 2021-01-16: 20 mg via INTRAVENOUS
  Administered 2021-01-16: 50 mg via INTRAVENOUS

## 2021-01-16 MED ORDER — SODIUM CHLORIDE 0.9 % IV SOLN
INTRAVENOUS | Status: AC | PRN
  Administered 2021-01-16: 500 mL via INTRAMUSCULAR

## 2021-01-16 MED ORDER — FENTANYL CITRATE (PF) 100 MCG/2ML IJ SOLN
INTRAMUSCULAR | Status: DC | PRN
  Administered 2021-01-16 (×4): 50 ug via INTRAVENOUS

## 2021-01-16 MED ORDER — IOHEXOL 350 MG/ML SOLN
100.0000 mL | Freq: Once | INTRAVENOUS | Status: AC | PRN
  Administered 2021-01-16: 100 mL via INTRAVENOUS

## 2021-01-16 MED ORDER — FENTANYL CITRATE (PF) 100 MCG/2ML IJ SOLN
25.0000 ug | INTRAMUSCULAR | Status: DC | PRN
Start: 2021-01-16 — End: 2021-01-17
  Administered 2021-01-16: 25 ug via INTRAVENOUS
  Administered 2021-01-16 (×2): 50 ug via INTRAVENOUS

## 2021-01-16 MED ORDER — SUCCINYLCHOLINE CHLORIDE 200 MG/10ML IV SOSY
PREFILLED_SYRINGE | INTRAVENOUS | Status: AC
  Filled 2021-01-16: qty 10

## 2021-01-16 MED ORDER — SUGAMMADEX SODIUM 200 MG/2ML IV SOLN
INTRAVENOUS | Status: DC | PRN
  Administered 2021-01-16: 150 mg via INTRAVENOUS

## 2021-01-16 MED ORDER — ONDANSETRON HCL 4 MG/2ML IJ SOLN
INTRAMUSCULAR | Status: DC | PRN
  Administered 2021-01-16: 4 mg via INTRAVENOUS

## 2021-01-16 MED ORDER — FENTANYL CITRATE (PF) 100 MCG/2ML IJ SOLN
INTRAMUSCULAR | Status: AC
  Administered 2021-01-16: 25 ug via INTRAVENOUS
  Filled 2021-01-16: qty 2

## 2021-01-16 MED ORDER — ONDANSETRON HCL 4 MG/2ML IJ SOLN
INTRAMUSCULAR | Status: AC
  Filled 2021-01-16: qty 2

## 2021-01-16 MED ORDER — LIDOCAINE HCL (CARDIAC) PF 100 MG/5ML IV SOSY
PREFILLED_SYRINGE | INTRAVENOUS | Status: DC | PRN
  Administered 2021-01-16: 100 mg via INTRAVENOUS

## 2021-01-16 SURGICAL SUPPLY — 34 items
BLADE CLIPPER SURG (BLADE) ×1 IMPLANT
BLADE SURG 15 STRL LF DISP TIS (BLADE) ×1 IMPLANT
BLADE SURG 15 STRL SS (BLADE)
BNDG ELASTIC 6X5.8 VLCR STR LF (GAUZE/BANDAGES/DRESSINGS) ×1 IMPLANT
BNDG GAUZE ELAST 4 BULKY (GAUZE/BANDAGES/DRESSINGS) ×2 IMPLANT
BRUSH SCRUB EZ  4% CHG (MISCELLANEOUS)
BRUSH SCRUB EZ 4% CHG (MISCELLANEOUS) ×1 IMPLANT
CANISTER SUCT 3000ML PPV (MISCELLANEOUS) ×1 IMPLANT
DRAPE 3/4 80X56 (DRAPES) ×2 IMPLANT
DRAPE CHEST BREAST 77X106 FENE (MISCELLANEOUS) IMPLANT
DRAPE LAPAROTOMY 77X122 PED (DRAPES) ×2 IMPLANT
ELECT REM PT RETURN 9FT ADLT (ELECTROSURGICAL) ×2 IMPLANT
ELECTRODE REM PT RTRN 9FT ADLT (ELECTROSURGICAL) ×1 IMPLANT
GAUZE 4X4 16PLY ~~LOC~~+RFID DBL (SPONGE) ×2 IMPLANT
GAUZE SPONGE 4X4 12PLY STRL (GAUZE/BANDAGES/DRESSINGS) ×2 IMPLANT
GLOVE SURG ENC MOIS LTX SZ6.5 (GLOVE) ×6 IMPLANT
GLOVE SURG UNDER POLY LF SZ6.5 (GLOVE) ×3 IMPLANT
GOWN STRL REUS W/ TWL LRG LVL3 (GOWN DISPOSABLE) ×2 IMPLANT
GOWN STRL REUS W/TWL LRG LVL3 (GOWN DISPOSABLE) ×4
HANDPIECE VERSAJET DEBRIDEMENT (MISCELLANEOUS) ×1 IMPLANT
MANIFOLD NEPTUNE II (INSTRUMENTS) ×2 IMPLANT
NEEDLE HYPO 22GX1.5 SAFETY (NEEDLE) ×1 IMPLANT
NS IRRIG 1000ML POUR BTL (IV SOLUTION) ×2 IMPLANT
PACK BASIN MINOR ARMC (MISCELLANEOUS) ×2 IMPLANT
PAD ABD DERMACEA PRESS 5X9 (GAUZE/BANDAGES/DRESSINGS) ×1 IMPLANT
SOL .9 NS 3000ML IRR  AL (IV SOLUTION) ×2
SOL .9 NS 3000ML IRR AL (IV SOLUTION) ×1
SOL .9 NS 3000ML IRR UROMATIC (IV SOLUTION) IMPLANT
SOL PREP PVP 2OZ (MISCELLANEOUS) ×6 IMPLANT
SOLUTION PREP PVP 2OZ (MISCELLANEOUS) ×2 IMPLANT
SPONGE T-LAP 18X18 ~~LOC~~+RFID (SPONGE) ×5 IMPLANT
SUT VIC AB 3-0 SH 27 (SUTURE) ×2
SUT VIC AB 3-0 SH 27X BRD (SUTURE) IMPLANT
TRAY FOLEY SLVR 16FR LF STAT (SET/KITS/TRAYS/PACK) ×1 IMPLANT
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
## 2021-01-16 NOTE — ED Notes (Signed)
Assisted pt to the restroom 
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
## 2021-01-16 NOTE — Progress Notes (Addendum)
PROGRESS NOTE    Collen Hostler Todaro  XNA:[id number] DOB: [birth year] DOA: 01/15/2021 PCP: [name], MD   Chief Complaint  Patient presents with   Cellulitis     Brief Narrative: 60 year old female with PMH of Bipolar Depression and chronic LLE cellulitis presents to the ED with significant non-purulent cellulitis of the LLE with small bullae, WBC 26K and Lactic Acid of 2.7 mmol/L.   Subjective: Leg is significantly swollen, erythematous, and significnatly tender.  Skin is tense with bullae on the left medial calf. Ms. Brackin tells me it looks better than last night. She has no fevers or chills. Vitals are stable.   Assessment & Plan: Principal Problem:   Cellulitis of left leg Active Problems:   Chronic pain syndrome   GERD (gastroesophageal reflux disease)   Bipolar 1 disorder (HCC)   COPD (chronic obstructive pulmonary disease) (HCC)   Sepsis (HCC)   History of parasitic infection  Left Lower Extremity Cellulitis, Severe associated with Leukocytosis and Lactic Acidosis:  - Check CT of LLE. - CT Scan showed necrotizing fasciitis.   - I called General Surgery who will take patient to OR.   - I broadened antibiotics to Vanc Cefepime and Flagyl and consulted Infectious Disease. - Follow up on surgical cultures in OR.  Possible Drug User: - Check Urine Drug Screen. - Check HIV Screen and Hepatitis panel in am.  Hypokalemia: - K 3.1 mmol/L, replaced. - Check all electrolytes.  Chronic COPD, not in acute exacerbation: - Inhalers PRN  Bipolar Depression: - Start home Abilify, Celexa, and Xanax PRN.  Med rec confirmation is still pending.  GERD:  - PPI  Diet Order             Diet regular Room service appropriate? Yes; Fluid consistency: Thin  Diet effective now                            DVT prophylaxis: enoxaparin (LOVENOX) injection 40 mg Start: 01/15/21 2230 Code Status:   Code Status: Full Code  Family Communication: plan of care discussed  with patient at bedside.  Status is: Inpatient  Remains inpatient appropriate because:Inpatient level of care appropriate due to severity of illness  Dispo: The patient is from: Home              Anticipated d/c is to: Home              Patient currently is not medically stable to d/c.   Difficult to place patient No     Unresulted Labs (From admission, onward)     Start     Ordered   01/22/21 0500  Creatinine, serum  (enoxaparin (LOVENOX)    CrCl >/= 30 ml/min)  Weekly,   STAT     Comments: while on enoxaparin therapy    01/15/21 2229   01/17/21 0500  Creatinine, serum  Tomorrow morning,   R       Question:  Specimen collection method  Answer:  Lab=Lab collect   01/16/21 5427   01/16/21 1219  Aerobic Culture w Gram Stain (superficial specimen)  Once,   R        01/16/21 1219   01/15/21 1817  Urine culture  (Septic presentation on arrival (screening labs, nursing and treatment orders for obvious sepsis))  ONCE - STAT,   STAT        01/15/21 1822  Medications reviewed:  Scheduled Meds:  enoxaparin (LOVENOX) injection  40 mg Subcutaneous Q24H   Continuous Infusions:  ceFEPime (MAXIPIME) IV 2 g (01/16/21 1334)   lactated ringers 100 mL/hr at 01/16/21 0923   vancomycin 750 mg (01/16/21 1128)    Consultants:see note  Procedures:see note  Antimicrobials: Anti-infectives (From admission, onward)    Start     Dose/Rate Route Frequency Ordered Stop   01/16/21 2100  vancomycin (VANCOCIN) IVPB 1000 mg/200 mL premix  Status:  Discontinued        1,000 mg 200 mL/hr over 60 Minutes Intravenous Every 24 hours 01/15/21 2246 01/16/21 0937   01/16/21 1030  vancomycin (VANCOREADY) IVPB 750 mg/150 mL        750 mg 150 mL/hr over 60 Minutes Intravenous 2 times daily 01/16/21 0937     01/16/21 0300  ceFEPIme (MAXIPIME) 2 g in sodium chloride 0.9 % 100 mL IVPB        2 g 200 mL/hr over 30 Minutes Intravenous Every 8 hours 01/15/21 2242     01/15/21 1900  vancomycin  (VANCOREADY) IVPB 1500 mg/300 mL        1,500 mg 150 mL/hr over 120 Minutes Intravenous  Once 01/15/21 1825 01/15/21 2302   01/15/21 1830  vancomycin (VANCOCIN) IVPB 1000 mg/200 mL premix  Status:  Discontinued        1,000 mg 200 mL/hr over 60 Minutes Intravenous  Once 01/15/21 1822 01/15/21 1825   01/15/21 1830  cefTRIAXone (ROCEPHIN) 2 g in sodium chloride 0.9 % 100 mL IVPB        2 g 200 mL/hr over 30 Minutes Intravenous  Once 01/15/21 1822 01/15/21 1923      Culture/Microbiology    Component Value Date/Time   SDES BLOOD RIGHT ANTECUBITAL 01/15/2021 1849   SDES BLOOD BLOOD RIGHT HAND 01/15/2021 1849   SPECREQUEST  01/15/2021 1849    BOTTLES DRAWN AEROBIC AND ANAEROBIC Blood Culture results may not be optimal due to an inadequate volume of blood received in culture bottles   SPECREQUEST  01/15/2021 1849    BOTTLES DRAWN AEROBIC AND ANAEROBIC Blood Culture results may not be optimal due to an inadequate volume of blood received in culture bottles   CULT  01/15/2021 1849    NO GROWTH < 12 HOURS Performed at [facility], [address] [place] [place], [place] [address]    CULT  01/15/2021 1849    NO GROWTH < 12 HOURS Performed at [facility], [address], [place], [place] [address]    REPTSTATUS PENDING 01/15/2021 1849   REPTSTATUS PENDING 01/15/2021 1849    Other culture-see note  Objective: Vitals: Today's Vitals   01/16/21 0900 01/16/21 1021 01/16/21 1211 01/16/21 1557  BP:   (!) 118/54 (!) 108/53  Pulse:   97 91  Resp:   20 18  Temp:   100.2 F (37.9 C) 98.2 F (36.8 C)  TempSrc:   Oral   SpO2:   98% 97%  Weight:      Height:      PainSc: 10-Worst pain ever 5       Intake/Output Summary (Last 24 hours) at 01/16/2021 1658 Last data filed at 01/16/2021 0933 Gross per 24 hour  Intake 1920.45 ml  Output --  Net 1920.45 ml   Filed Weights   01/15/21 1600  Weight: 65.8 kg   Weight change:   Intake/Output from previous day: 07/06 0701 -  07/07 0700 In: 1560.5 [IV Piggyback:1560.5] Out: -  Intake/Output  this shift: Total I/O In: 360 [P.O.:360] Out: -  Filed Weights   01/15/21 1600  Weight: 65.8 kg    Examination: General exam: alert and oriented, mental status intact,  HEENT: NCAT, PERRL Respiratory system: CTAB no WRR Cardiovascular system: Did not appreciate a murmur, regular, No JVD. Gastrointestinal system: No flank pain, Abdomen soft, NT,ND, BS+. Nervous System: No focal deficits. Extremities: No edema, distal peripheral pulses palpable.  Skin: No rashes, No bruises, No icterus. MSK: stable  Data Reviewed: I have personally reviewed following labs and imaging studies CBC: Recent Labs  Lab 01/15/21 1605 01/16/21 0600  WBC 26.3* 33.3*  NEUTROABS 22.9*  --   HGB 12.8 11.4*  HCT 36.9 31.9*  MCV 87.0 86.0  PLT 266 267   Basic Metabolic Panel: Recent Labs  Lab 01/15/21 1605 01/16/21 0027 01/16/21 0600  NA 131*  --  136  K 3.0*  --  3.1*  CL 98  --  101  CO2 24  --  27  GLUCOSE 132*  --  109*  BUN 18  --  12  CREATININE 0.89 0.89 0.73  CALCIUM 9.0  --  8.4*    Liver Function Tests: Recent Labs  Lab 01/15/21 1605  AST 17  ALT 14  ALKPHOS 114  BILITOT 0.5  PROT 6.4*  ALBUMIN 2.6*    Coagulation Profile: Recent Labs  Lab 01/15/21 1849  INR 1.0    Sepsis Labs: Recent Labs  Lab 01/15/21 1849 01/15/21 2048 01/16/21 0027  LATICACIDVEN 1.8 2.7* 2.8*    Recent Results (from the past 240 hour(s))  Resp Panel by RT-PCR (Flu A&B, Covid) Nasopharyngeal Swab     Status: None   Collection Time: 01/15/21  6:49 PM   Specimen: Nasopharyngeal Swab; Nasopharyngeal(NP) swabs in vial transport medium  Result Value Ref Range Status   SARS Coronavirus 2 by RT PCR NEGATIVE NEGATIVE Final    Comment: (NOTE) SARS-CoV-2 target nucleic acids are NOT DETECTED.  The SARS-CoV-2 RNA is generally detectable in upper respiratory specimens during the acute phase of infection. The  lowest concentration of SARS-CoV-2 viral copies this assay can detect is 138 copies/mL. A negative result does not preclude SARS-Cov-2 infection and should not be used as the sole basis for treatment or other patient management decisions. A negative result may occur with  improper specimen collection/handling, submission of specimen other than nasopharyngeal swab, presence of viral mutation(s) within the areas targeted by this assay, and inadequate number of viral copies(<138 copies/mL). A negative result must be combined with clinical observations, patient history, and epidemiological information. The expected result is Negative.  Fact Sheet for Patients:  BloggerCourse.com  Fact Sheet for Healthcare Providers:  SeriousBroker.it  This test is no t yet approved or cleared by the [place] FDA and  has been authorized for detection and/or diagnosis of SARS-CoV-2 by FDA under an Emergency Use Authorization (EUA). This EUA will remain  in effect (meaning this test can be used) for the duration of the COVID-19 declaration under Section 564(b)(1) of the Act, 21 U.S.C.section 360bbb-3(b)(1), unless the authorization is terminated  or revoked sooner.       Influenza A by PCR NEGATIVE NEGATIVE Final   Influenza B by PCR NEGATIVE NEGATIVE Final    Comment: (NOTE) The Xpert Xpress SARS-CoV-2/FLU/RSV plus assay is intended as an aid in the diagnosis of influenza from Nasopharyngeal swab specimens and should not be used as a sole basis for treatment. Nasal washings and aspirates are unacceptable for Xpert Xpress  SARS-CoV-2/FLU/RSV testing.  Fact Sheet for Patients: BloggerCourse.com  Fact Sheet for Healthcare Providers: SeriousBroker.it  This test is not yet approved or cleared by the [place] FDA and has been authorized for detection and/or diagnosis of SARS-CoV-2 by FDA under  an Emergency Use Authorization (EUA). This EUA will remain in effect (meaning this test can be used) for the duration of the COVID-19 declaration under Section 564(b)(1) of the Act, 21 U.S.C. section 360bbb-3(b)(1), unless the authorization is terminated or revoked.  Performed at [facility], [address] [place], [place] [address]   Blood Culture (routine x 2)     Status: None (Preliminary result)   Collection Time: 01/15/21  6:49 PM   Specimen: BLOOD  Result Value Ref Range Status   Specimen Description BLOOD RIGHT ANTECUBITAL  Final   Special Requests   Final    BOTTLES DRAWN AEROBIC AND ANAEROBIC Blood Culture results may not be optimal due to an inadequate volume of blood received in culture bottles   Culture   Final    NO GROWTH < 12 HOURS Performed at [facility], [address] [place], [place] [address]    Report Status PENDING  Incomplete  Blood Culture (routine x 2)     Status: None (Preliminary result)   Collection Time: 01/15/21  6:49 PM   Specimen: BLOOD  Result Value Ref Range Status   Specimen Description BLOOD BLOOD RIGHT HAND  Final   Special Requests   Final    BOTTLES DRAWN AEROBIC AND ANAEROBIC Blood Culture results may not be optimal due to an inadequate volume of blood received in culture bottles   Culture   Final    NO GROWTH < 12 HOURS Performed at [facility], [address] [place], [place] [address]    Report Status PENDING  Incomplete     Radiology Studies: US Venous Img Lower Unilateral Left  Result Date: 01/15/2021 CLINICAL DATA:  Left lower extremity swelling and redness for 7 days. EXAM: Left LOWER EXTREMITY VENOUS DOPPLER ULTRASOUND TECHNIQUE: Gray-scale sonography with compression, as well as color and duplex ultrasound, were performed to evaluate the deep venous system(s) from the level of the common femoral vein through the popliteal and proximal calf veins. COMPARISON:  None. FINDINGS: VENOUS Normal  compressibility of the common femoral, superficial femoral, and popliteal veins, as well as the visualized calf veins. Visualized portions of profunda femoral vein and great saphenous vein unremarkable. No filling defects to suggest DVT on grayscale or color Doppler imaging. Doppler waveforms show normal direction of venous flow, normal respiratory plasticity and response to augmentation. Limited views of the contralateral common femoral vein are unremarkable. OTHER Incidental note of prominent lymph nodes in the left groin region. Morphology is benign suggesting reactive node. Limitations: Evaluation of the calf veins is somewhat limited technically due to soft tissue swelling and patient unable to tolerate pressure from the transducer. IMPRESSION: No evidence of acute deep venous thrombosis in the visualized lower extremity veins. Electronically Signed   By: Burman Nieves M.D.   On: 01/15/2021 21:34   DG Chest Port 1 View  Result Date: 01/15/2021 CLINICAL DATA:  Lower leg swelling and cellulitis for 1 week. Disorientation. Possible sepsis. EXAM: PORTABLE CHEST 1 VIEW COMPARISON:  Radiographs 03/22/2019 and 01/06/2019.  CT 07/26/2020. FINDINGS: 1825 hours. The heart size and mediastinal contours are normal. The lungs are clear. There is no pleural effusion or pneumothorax. No acute osseous findings are identified. IMPRESSION: Stable chest.  No active  cardiopulmonary process. Electronically Signed   By: Carey Bullocks M.D.   On: 01/15/2021 19:00     LOS: 1 day   Baldwin Jamaica, MD [facility]  01/16/2021, 4:58 PM

## 2021-01-16 NOTE — Op Note (Signed)
ATTENDING Surgeon(s): Carolan Shiver, MD   ANESTHESIA: General   PRE-OPERATIVE DIAGNOSIS: Left lower extremity necrotizing fasciitis   POST-OPERATIVE DIAGNOSIS: Left lower extremity necrotizing fasciitis   PROCEDURE(S):  1.) Sharp excisional debridement of left lower extremity    INTRAOPERATIVE FINDINGS:  Preoperatively there was no open wound.  Cellulitis extended from ankle to mid thigh Extensive debridement of the left leg and thigh extending 60 cm long and 25 cm wide (1500 sq cm)        ESTIMATED BLOOD LOSS: 250 mL    SPECIMENS: Left lower extremity necrotizing fasciitis tissue   COMPLICATIONS: None apparent   CONDITION AT END OF PROCEDURE: Hemodynamically stable and awake   INDICATIONS FOR PROCEDURE:  Patient is 60 year old female with necrotizing fasciitis. Patient with leukocytosis and crepitus on physical exam.      DETAILS OF PROCEDURE: After informed consent,patient was taken to the OR. Time out performed. General anesthesia induced. Patient placed on supine position. The left lower extremity was cleaned and draped in sterile fashion. With #10 blade a longitudinal incision was done on the medial aspect of the left leg.  Dissection was taken down with cautery.  Abundant amount of necrotic subcutaneous tissue and fascia was identified.  The that tissue was excised with blunt dissection and scissors.  Abundant amount of purulence was also drainage another.  A second incision was on the lateral aspect of the left leg.  Also dissection was carried out with cautery.  Necrotic subcutaneous and fascia was excised.  A medial incision was done on the distal thigh medially.  Small amount of necrotic tissue and abundant amount of edema was debrided and drained.  With the use of Versajet further debridement was done.  Hemostasis was achieved.  The wounds were left open with Dakin's packing.  Wounds were covered with Kerlix and Ace bandage.

## 2021-01-16 NOTE — Transfer of Care (Signed)
Immediate Anesthesia Transfer of Care Note  Patient: Kaylee [name]  Procedure(s) Performed: irrigation and debridement left leg (Left)  Patient Location: PACU  Anesthesia Type:General  Level of Consciousness: awake  Airway & Oxygen Therapy: Patient Spontanous Breathing and Patient connected to face mask oxygen  Post-op Assessment: Report given to RN and Post -op Vital signs reviewed and stable  Post vital signs: Reviewed and stable  Last Vitals:  Vitals Value Taken Time  BP 114/53 01/16/21 2241  Temp 36.3 C 01/16/21 2241  Pulse 85 01/16/21 2241  Resp 17 01/16/21 2242  SpO2 96 % 01/16/21 2241  Vitals shown include unvalidated device data.  Last Pain:  Vitals:   01/16/21 1211  TempSrc: Oral  PainSc:          Complications: No notable events documented.

## 2021-01-16 NOTE — Anesthesia Procedure Notes (Signed)
Procedure Name: Intubation Date/Time: 01/16/2021 8:22 PM Performed by: Irving Burton, CRNA Pre-anesthesia Checklist: Patient identified, Patient being monitored, Timeout performed, Emergency Drugs available and Suction available Patient Re-evaluated:Patient Re-evaluated prior to induction Oxygen Delivery Method: Circle system utilized Preoxygenation: Pre-oxygenation with 100% oxygen Induction Type: IV induction Ventilation: Mask ventilation without difficulty Laryngoscope Size: 3 and McGraph Grade View: Grade I Tube type: Oral Tube size: 7.0 mm Number of attempts: 1 Airway Equipment and Method: Stylet and Video-laryngoscopy Placement Confirmation: ETT inserted through vocal cords under direct vision, positive ETCO2 and breath sounds checked- equal and bilateral Secured at: 21 cm Tube secured with: Tape Dental Injury: Teeth and Oropharynx as per pre-operative assessment

## 2021-01-16 NOTE — Sepsis Progress Note (Signed)
Recommended additional 1L IVF and repeat lactic acid if provider finds it appropriate.

## 2021-01-17 ENCOUNTER — Inpatient Hospital Stay: Payer: Medicare Other | Admitting: Anesthesiology

## 2021-01-17 ENCOUNTER — Encounter
Admission: EM | Disposition: A | Discharge status: Home / Self Care | Payer: Self-pay | Source: Home / Self Care | Attending: Internal Medicine

## 2021-01-17 ENCOUNTER — Encounter: Payer: Self-pay | Admitting: General Surgery

## 2021-01-17 DIAGNOSIS — M726 Necrotizing fasciitis: Secondary | ICD-10-CM | POA: Diagnosis not present

## 2021-01-17 DIAGNOSIS — L03116 Cellulitis of left lower limb: Secondary | ICD-10-CM | POA: Diagnosis not present

## 2021-01-17 HISTORY — PX: APPLICATION OF WOUND VAC: SHX5189

## 2021-01-17 HISTORY — PX: INCISION AND DRAINAGE ABSCESS: SHX5864

## 2021-01-17 LAB — CBC WITH DIFFERENTIAL/PLATELET
Abs Immature Granulocytes: 2.2 10*3/uL — ABNORMAL HIGH (ref 0.00–0.07)
Basophils Absolute: 0.2 10*3/uL — ABNORMAL HIGH (ref 0.0–0.1)
Basophils Relative: 0 %
Eosinophils Absolute: 0 10*3/uL (ref 0.0–0.5)
Eosinophils Relative: 0 %
HCT: 31 % — ABNORMAL LOW (ref 36.0–46.0)
Hemoglobin: 10.7 g/dL — ABNORMAL LOW (ref 12.0–15.0)
Immature Granulocytes: 5 %
Lymphocytes Relative: 3 %
Lymphs Abs: 1.2 10*3/uL (ref 0.7–4.0)
MCH: 29.6 pg (ref 26.0–34.0)
MCHC: 34.5 g/dL (ref 30.0–36.0)
MCV: 85.6 fL (ref 80.0–100.0)
Monocytes Absolute: 0.7 10*3/uL (ref 0.1–1.0)
Monocytes Relative: 2 %
Neutro Abs: 37.5 10*3/uL — ABNORMAL HIGH (ref 1.7–7.7)
Neutrophils Relative %: 90 %
Platelets: 360 10*3/uL (ref 150–400)
RBC: 3.62 MIL/uL — ABNORMAL LOW (ref 3.87–5.11)
RDW: 14.7 % (ref 11.5–15.5)
Smear Review: NORMAL
WBC: 41.8 10*3/uL — ABNORMAL HIGH (ref 4.0–10.5)
nRBC: 0 % (ref 0.0–0.2)

## 2021-01-17 LAB — URINE DRUG SCREEN, QUALITATIVE (ARMC ONLY)
Amphetamines, Ur Screen: POSITIVE — AB
Barbiturates, Ur Screen: NOT DETECTED
Benzodiazepine, Ur Scrn: POSITIVE — AB
Cannabinoid 50 Ng, Ur ~~LOC~~: NOT DETECTED
Cocaine Metabolite,Ur ~~LOC~~: NOT DETECTED
MDMA (Ecstasy)Ur Screen: NOT DETECTED
Methadone Scn, Ur: NOT DETECTED
Opiate, Ur Screen: POSITIVE — AB
Phencyclidine (PCP) Ur S: NOT DETECTED
Tricyclic, Ur Screen: NOT DETECTED

## 2021-01-17 LAB — LACTIC ACID, PLASMA: Lactic Acid, Venous: 1.7 mmol/L (ref 0.5–1.9)

## 2021-01-17 LAB — URINE CULTURE: Culture: <10000 — AB

## 2021-01-17 LAB — HEPATITIS PANEL, ACUTE
HCV Ab: NONREACTIVE
Hep A IgM: NONREACTIVE
Hep B C IgM: NONREACTIVE
Hepatitis B Surface Ag: NONREACTIVE

## 2021-01-17 LAB — MAGNESIUM: Magnesium: 1.8 mg/dL (ref 1.7–2.4)

## 2021-01-17 LAB — BASIC METABOLIC PANEL
Anion gap: 6 (ref 5–15)
BUN: 11 mg/dL (ref 8–23)
CO2: 26 mmol/L (ref 22–32)
Calcium: 8.3 mg/dL — ABNORMAL LOW (ref 8.9–10.3)
Chloride: 103 mmol/L (ref 98–111)
Creatinine, Ser: 0.51 mg/dL (ref 0.44–1.00)
GFR, Estimated: >60 mL/min (ref >60)
Glucose, Bld: 168 mg/dL — ABNORMAL HIGH (ref 70–99)
Potassium: 3.7 mmol/L (ref 3.5–5.1)
Sodium: 135 mmol/L (ref 135–145)

## 2021-01-17 LAB — PHOSPHORUS: Phosphorus: 3.3 mg/dL (ref 2.5–4.6)

## 2021-01-17 SURGERY — INCISION AND DRAINAGE, ABSCESS
Anesthesia: General | Laterality: Left

## 2021-01-17 MED ORDER — CHLORHEXIDINE GLUCONATE CLOTH 2 % EX PADS
6.0000 | MEDICATED_PAD | Freq: Every day | CUTANEOUS | Status: DC
  Administered 2021-01-17 – 2021-01-25 (×7): 6 via TOPICAL

## 2021-01-17 MED ORDER — ROCURONIUM BROMIDE 10 MG/ML (PF) SYRINGE
PREFILLED_SYRINGE | INTRAVENOUS | Status: AC
  Filled 2021-01-17: qty 10

## 2021-01-17 MED ORDER — LIDOCAINE HCL (PF) 2 % IJ SOLN
INTRAMUSCULAR | Status: AC
  Filled 2021-01-17: qty 10

## 2021-01-17 MED ORDER — CITALOPRAM HYDROBROMIDE 20 MG PO TABS
20.0000 mg | ORAL_TABLET | Freq: Every day | ORAL | Status: DC
  Administered 2021-01-18 – 2021-01-20 (×3): 20 mg via ORAL
  Filled 2021-01-17 (×3): qty 1

## 2021-01-17 MED ORDER — MEPERIDINE HCL 25 MG/ML IJ SOLN: 6.2500 mg | INTRAMUSCULAR | Status: DC | PRN

## 2021-01-17 MED ORDER — FENTANYL CITRATE (PF) 100 MCG/2ML IJ SOLN
25.0000 ug | INTRAMUSCULAR | Status: DC | PRN
  Administered 2021-01-17: 50 ug via INTRAVENOUS

## 2021-01-17 MED ORDER — FENTANYL CITRATE (PF) 100 MCG/2ML IJ SOLN
INTRAMUSCULAR | Status: AC
  Filled 2021-01-17: qty 2

## 2021-01-17 MED ORDER — DEXAMETHASONE SODIUM PHOSPHATE 10 MG/ML IJ SOLN
INTRAMUSCULAR | Status: DC | PRN
  Administered 2021-01-17: 4 mg via INTRAVENOUS

## 2021-01-17 MED ORDER — ONDANSETRON HCL 4 MG/2ML IJ SOLN
INTRAMUSCULAR | Status: AC
  Filled 2021-01-17: qty 4

## 2021-01-17 MED ORDER — ACETAMINOPHEN 10 MG/ML IV SOLN
INTRAVENOUS | Status: DC | PRN
  Administered 2021-01-17: 1000 mg via INTRAVENOUS

## 2021-01-17 MED ORDER — OXYCODONE HCL 5 MG PO TABS: 5.0000 mg | ORAL_TABLET | Freq: Once | ORAL | Status: DC | PRN

## 2021-01-17 MED ORDER — DEXAMETHASONE SODIUM PHOSPHATE 10 MG/ML IJ SOLN
INTRAMUSCULAR | Status: AC
  Filled 2021-01-17: qty 1

## 2021-01-17 MED ORDER — ONDANSETRON HCL 4 MG/2ML IJ SOLN
INTRAMUSCULAR | Status: DC | PRN
  Administered 2021-01-17: 4 mg via INTRAVENOUS

## 2021-01-17 MED ORDER — SUGAMMADEX SODIUM 200 MG/2ML IV SOLN
INTRAVENOUS | Status: DC | PRN
  Administered 2021-01-17: 100 mg via INTRAVENOUS

## 2021-01-17 MED ORDER — LIDOCAINE HCL (PF) 2 % IJ SOLN
INTRAMUSCULAR | Status: AC
  Filled 2021-01-17: qty 5

## 2021-01-17 MED ORDER — PROPOFOL 10 MG/ML IV BOLUS
INTRAVENOUS | Status: AC
  Filled 2021-01-17: qty 40

## 2021-01-17 MED ORDER — ROCURONIUM BROMIDE 100 MG/10ML IV SOLN
INTRAVENOUS | Status: DC | PRN
  Administered 2021-01-17: 40 mg via INTRAVENOUS

## 2021-01-17 MED ORDER — PHENYLEPHRINE HCL (PRESSORS) 10 MG/ML IV SOLN
INTRAVENOUS | Status: AC
  Filled 2021-01-17: qty 1

## 2021-01-17 MED ORDER — ARIPIPRAZOLE 2 MG PO TABS
10.0000 mg | ORAL_TABLET | Freq: Every day | ORAL | Status: DC
  Administered 2021-01-18 – 2021-01-20 (×3): 10 mg via ORAL
  Filled 2021-01-17 (×3): qty 5

## 2021-01-17 MED ORDER — LIDOCAINE HCL (CARDIAC) PF 100 MG/5ML IV SOSY
PREFILLED_SYRINGE | INTRAVENOUS | Status: DC | PRN
  Administered 2021-01-17: 100 mg via INTRAVENOUS

## 2021-01-17 MED ORDER — PROPOFOL 10 MG/ML IV BOLUS
INTRAVENOUS | Status: DC | PRN
  Administered 2021-01-17: 100 mg via INTRAVENOUS

## 2021-01-17 MED ORDER — FENTANYL CITRATE (PF) 100 MCG/2ML IJ SOLN
INTRAMUSCULAR | Status: DC | PRN
  Administered 2021-01-17 (×2): 50 ug via INTRAVENOUS

## 2021-01-17 MED ORDER — PANTOPRAZOLE SODIUM 40 MG IV SOLR
40.0000 mg | Freq: Every day | INTRAVENOUS | Status: DC
  Administered 2021-01-17 – 2021-01-20 (×4): 40 mg via INTRAVENOUS
  Filled 2021-01-17 (×4): qty 40

## 2021-01-17 MED ORDER — OXYCODONE HCL 5 MG/5ML PO SOLN: 5.0000 mg | Freq: Once | ORAL | Status: DC | PRN

## 2021-01-17 MED ORDER — PROMETHAZINE HCL 25 MG/ML IJ SOLN: 6.2500 mg | INTRAMUSCULAR | Status: DC | PRN

## 2021-01-17 MED ORDER — ACETAMINOPHEN 10 MG/ML IV SOLN
INTRAVENOUS | Status: AC
  Filled 2021-01-17: qty 100

## 2021-01-17 SURGICAL SUPPLY — 33 items
BLADE CLIPPER SURG (BLADE) ×1 IMPLANT
BLADE SURG 15 STRL LF DISP TIS (BLADE) ×1 IMPLANT
BLADE SURG 15 STRL SS (BLADE) ×2
BRUSH SCRUB EZ  4% CHG (MISCELLANEOUS)
BRUSH SCRUB EZ 4% CHG (MISCELLANEOUS) ×1 IMPLANT
CANISTER SUCT 1200ML W/VALVE (MISCELLANEOUS) ×2 IMPLANT
CANISTER WOUND CARE 500ML ATS (WOUND CARE) ×2 IMPLANT
CASSETTE VERAFLO VERALINK (MISCELLANEOUS) ×1 IMPLANT
DRAPE CHEST BREAST 77X106 FENE (MISCELLANEOUS) IMPLANT
DRAPE DERMATAC (DRAPES) ×7 IMPLANT
DRAPE LAPAROTOMY 100X77 ABD (DRAPES) ×2 IMPLANT
DRAPE LAPAROTOMY 77X122 PED (DRAPES) ×2 IMPLANT
DRESSING VERAFLO CLEANSE CC (GAUZE/BANDAGES/DRESSINGS) IMPLANT
DRSG VAC ATS LRG SENSATRAC (GAUZE/BANDAGES/DRESSINGS) ×1 IMPLANT
DRSG VAC ATS MED SENSATRAC (GAUZE/BANDAGES/DRESSINGS) ×2 IMPLANT
DRSG VERAFLO CLEANSE CC (GAUZE/BANDAGES/DRESSINGS) ×4 IMPLANT
ELECT REM PT RETURN 9FT ADLT (ELECTROSURGICAL) ×2 IMPLANT
ELECTRODE REM PT RTRN 9FT ADLT (ELECTROSURGICAL) ×1 IMPLANT
GAUZE 4X4 16PLY ~~LOC~~+RFID DBL (SPONGE) ×1 IMPLANT
GAUZE SPONGE 4X4 12PLY STRL (GAUZE/BANDAGES/DRESSINGS) ×1 IMPLANT
GLOVE SURG ENC MOIS LTX SZ6.5 (GLOVE) ×4 IMPLANT
GLOVE SURG UNDER POLY LF SZ6.5 (GLOVE) ×4 IMPLANT
GOWN STRL REUS W/ TWL LRG LVL3 (GOWN DISPOSABLE) ×2 IMPLANT
GOWN STRL REUS W/TWL LRG LVL3 (GOWN DISPOSABLE) ×8
KIT TURNOVER KIT A (KITS) ×2 IMPLANT
MANIFOLD NEPTUNE II (INSTRUMENTS) ×2 IMPLANT
NEEDLE HYPO 22GX1.5 SAFETY (NEEDLE) ×2 IMPLANT
NS IRRIG 1000ML POUR BTL (IV SOLUTION) ×2 IMPLANT
PACK BASIN MINOR ARMC (MISCELLANEOUS) ×2 IMPLANT
SOL PREP PVP 2OZ (MISCELLANEOUS) ×2 IMPLANT
SOLUTION PREP PVP 2OZ (MISCELLANEOUS) ×2 IMPLANT
SPONGE T-LAP 18X18 ~~LOC~~+RFID (SPONGE) ×2 IMPLANT
VAC THERAPY ×1 IMPLANT

## 2021-01-17 NOTE — Progress Notes (Addendum)
PROGRESS NOTE    Kaylee [name]  WJX:[id number] DOB: [birth year] DOA: 01/15/2021 PCP: [name], MD   Chief Complaint  Patient presents with   Cellulitis     Brief Narrative: 60 year old female with PMH of Diabetes, Trichtollomania, and Polysubstance Abuse including (Cocaine and Methamphetamine abuse related skin picking), and chronic LLE skin infections presents to the ED with significant cellulitis of the LLE with small bullae, Leukocytosis up to 30-40K, and Lactic Acidosis up to 2.8 mmol/L found to have significant necrotizing fasciitis s/p 7/7 and 7/8 wound debridement.  Subjective: Vitals are stable.  Leg remains significantly swollen and tender.  Patient remains drowsy and lacks insight into the severity of infection.  With Ms. Piccione's consent, I spoke with daughter over the phone about Ms. Oliva's severe infection and plan of care.  Daughter tells me patient has a long history of methamphetamine use with skin picking as well as a chronic psychotic disorder that lead to recurrent skin infections of the leg.  Assessment & Plan: Principal Problem:   Cellulitis of left leg Active Problems:   Chronic pain syndrome   GERD (gastroesophageal reflux disease)   Bipolar 1 disorder (HCC)   COPD (chronic obstructive pulmonary disease) (HCC)   Sepsis (HCC)   History of parasitic infection  Per [facility] inquiry -  sepsis is resolved. Severe Necrotizing Fasciitis of the Left Lower Extremity s/p 7/7 and 7/8 Wound Debridement: CT showed prominent subcutaneous edema in the left lower extremity that extend from the ankle and foot into the inferior portions of the thigh. There is no drainable fluid collection.  There is no septic joint. - S/P 2nd debridement today with wound vac in place. - General Surgery is following, appreciate assistance and recommendations. - Infectious Disease is following, appreciate assistance and recommendations. - Continue Vancomycin Cefepime and  Clindamycin. - Follow up on surgical cultures in OR and de-escalate based on sensitivities. - Repeat CT Scan of LLE if needed.  Significant Leukocytosis: WBC increased from 33K to 41K. - Repeat debridement showed no fluid collection. - Peripheral smear showed no abnormal cytology. - 7/6 Blood Cultures show NG. - This may be reactive from prior surgery.  Monitor.  Lactic Acidosis, resolved: - Monitor.  Polysubstance Abuse (cocaine methamphetamine opiates):  - UDS was positive for opiates, benzos, and amphetamine. - HIV and Hepatitis Screen were negative. - Counseled on cessation.  Hypokalemia: - K 3.49mmol/L, replaced. - Mg normal.  Ph normal.  Substance Induced Mood Disorder / Paranoia: - Patient is on Abilify 5 mg and Celexa 10 mg at home. - Increase Abilify to 10 mg daily. - Increase Celexa to 20 mg daily. - Give Xanax 0.5 mg TID PRN. - Monitor mood.  GERD:  - Start IV Protonix 40 mg daily for px.  Chronic COPD, not in acute exacerbation: - Inhalers PRN  Diet Order             Diet regular Room service appropriate? Yes; Fluid consistency: Thin  Diet effective now                            DVT prophylaxis: enoxaparin (LOVENOX) injection 40 mg Start: 01/15/21 2230 Code Status:   Code Status: Full Code  Family Communication: plan of care discussed with patient at bedside.  Status is: Inpatient  Remains inpatient appropriate because:Inpatient level of care appropriate due to severity of illness  Dispo: The patient is from: Home  Anticipated d/c is to: Home              Patient currently is not medically stable to d/c.   Difficult to place patient No     Unresulted Labs (From admission, onward)     Start     Ordered   01/22/21 0500  Creatinine, serum  (enoxaparin (LOVENOX)    CrCl >/= 30 ml/min)  Weekly,   STAT     Comments: while on enoxaparin therapy    01/15/21 2229   01/17/21 0500  Basic metabolic panel  Daily,   R     Question:   Specimen collection method  Answer:  Lab=Lab collect   01/16/21 2146   01/17/21 0500  CBC with Differential/Platelet  Daily,   R     Question:  Specimen collection method  Answer:  Lab=Lab collect   01/16/21 2146             Medications reviewed:  Scheduled Meds:  ARIPiprazole  5 mg Oral Daily   Chlorhexidine Gluconate Cloth  6 each Topical Daily   citalopram  10 mg Oral Daily   enoxaparin (LOVENOX) injection  40 mg Subcutaneous Q24H   Continuous Infusions:  ceFEPime (MAXIPIME) IV 2 g (01/17/21 0540)   clindamycin (CLEOCIN) IV 900 mg (01/17/21 0656)   lactated ringers 100 mL/hr at 01/17/21 1559   vancomycin 750 mg (01/17/21 1000)    Consultants:see note  Procedures:see note  Antimicrobials: Anti-infectives (From admission, onward)    Start     Dose/Rate Route Frequency Ordered Stop   01/16/21 2200  clindamycin (CLEOCIN) IVPB 900 mg        900 mg 100 mL/hr over 30 Minutes Intravenous Every 8 hours 01/16/21 1914     01/16/21 2100  vancomycin (VANCOCIN) IVPB 1000 mg/200 mL premix  Status:  Discontinued        1,000 mg 200 mL/hr over 60 Minutes Intravenous Every 24 hours 01/15/21 2246 01/16/21 0937   01/16/21 2000  metroNIDAZOLE (FLAGYL) IVPB 500 mg  Status:  Discontinued        500 mg 100 mL/hr over 60 Minutes Intravenous Every 8 hours 01/16/21 1908 01/16/21 1914   01/16/21 1030  vancomycin (VANCOREADY) IVPB 750 mg/150 mL        750 mg 150 mL/hr over 60 Minutes Intravenous 2 times daily 01/16/21 0937     01/16/21 0300  ceFEPIme (MAXIPIME) 2 g in sodium chloride 0.9 % 100 mL IVPB        2 g 200 mL/hr over 30 Minutes Intravenous Every 8 hours 01/15/21 2242     01/15/21 1900  vancomycin (VANCOREADY) IVPB 1500 mg/300 mL        1,500 mg 150 mL/hr over 120 Minutes Intravenous  Once 01/15/21 1825 01/15/21 2302   01/15/21 1830  vancomycin (VANCOCIN) IVPB 1000 mg/200 mL premix  Status:  Discontinued        1,000 mg 200 mL/hr over 60 Minutes Intravenous  Once 01/15/21 1822  01/15/21 1825   01/15/21 1830  cefTRIAXone (ROCEPHIN) 2 g in sodium chloride 0.9 % 100 mL IVPB        2 g 200 mL/hr over 30 Minutes Intravenous  Once 01/15/21 1822 01/15/21 1923      Culture/Microbiology    Component Value Date/Time   SDES  01/16/2021 1540    WOUND Performed at [facility], [address], [place], [place] [address]    [facility]  01/16/2021 1540    NONE Performed at  [facility], [address] [place], [place] [address]    CULT PENDING 01/16/2021 1540   REPTSTATUS PENDING 01/16/2021 1540    Other culture-see note  Objective: Vitals: Today's Vitals   01/17/21 1400 01/17/21 1415 01/17/21 1438 01/17/21 1539  BP: 107/60 101/63 98/63 106/61  Pulse: 88 88 82 84  Resp: Temp:  (!) 97.2 F (36.2 C) 98.3 F (36.8 C) 97.7 F (36.5 C)  TempSrc:      SpO2: 95% 94% 100% 98%  Weight:      Height:      PainSc: Asleep Asleep Asleep     Intake/Output Summary (Last 24 hours) at 01/17/2021 1729 Last data filed at 01/17/2021 1332 Gross per 24 hour  Intake 2437.16 ml  Output 1455 ml  Net 982.16 ml    Filed Weights   01/15/21 1600  Weight: 65.8 kg   Weight change:   Intake/Output from previous day: 07/07 0701 - 07/08 0700 In: 2097.2 [P.O.:360; I.V.:1275.5; IV Piggyback:461.7] Out: 850 [Urine:600; Blood:250] Intake/Output this shift: Total I/O In: 700 [I.V.:700] Out: 605 [Urine:600; Blood:5] Filed Weights   01/15/21 1600  Weight: 65.8 kg    Examination: General exam: alert and oriented, mental status intact,  HEENT: NCAT, PERRL Respiratory system: CTAB no WRR Cardiovascular system: Did not appreciate a murmur, regular, No JVD. Gastrointestinal system: No flank pain, Abdomen soft, NT,ND, BS+. Nervous System: No focal deficits. Extremities: No edema, distal peripheral pulses palpable.  Skin: No rashes, No bruises, No icterus. MSK: stable  Data Reviewed: I have personally reviewed following labs and imaging  studies CBC: Recent Labs  Lab 01/15/21 1605 01/16/21 0600 01/17/21 0701  WBC 26.3* 33.3* 41.8*  NEUTROABS 22.9*  --  37.5*  HGB 12.8 11.4* 10.7*  HCT 36.9 31.9* 31.0*  MCV 87.0 86.0 85.6  PLT 266 267 360    Basic Metabolic Panel: Recent Labs  Lab 01/15/21 1605 01/16/21 0027 01/16/21 0600 01/17/21 0701  NA 131*  --  136 135  K 3.0*  --  3.1* 3.7  CL 98  --  101 103  CO2 24  --  27 26  GLUCOSE 132*  --  109* 168*  BUN 18  --  12 11  CREATININE 0.89 0.89 0.73 0.51  CALCIUM 9.0  --  8.4* 8.3*  MG  --   --   --  1.8  PHOS  --   --   --  3.3     Liver Function Tests: Recent Labs  Lab 01/15/21 1605  AST 17  ALT 14  ALKPHOS 114  BILITOT 0.5  PROT 6.4*  ALBUMIN 2.6*     Coagulation Profile: Recent Labs  Lab 01/15/21 1849  INR 1.0     Sepsis Labs: Recent Labs  Lab 01/15/21 1849 01/15/21 2048 01/16/21 0027 01/17/21 0701  LATICACIDVEN 1.8 2.7* 2.8* 1.7     Recent Results (from the past 240 hour(s))  Resp Panel by RT-PCR (Flu A&B, Covid) Nasopharyngeal Swab     Status: None   Collection Time: 01/15/21  6:49 PM   Specimen: Nasopharyngeal Swab; Nasopharyngeal(NP) swabs in vial transport medium  Result Value Ref Range Status   SARS Coronavirus 2 by RT PCR NEGATIVE NEGATIVE Final    Comment: (NOTE) SARS-CoV-2 target nucleic acids are NOT DETECTED.  The SARS-CoV-2 RNA is generally detectable in upper respiratory specimens during the acute phase of infection. The lowest concentration of SARS-CoV-2 viral copies this assay can detect is 138 copies/mL. A  negative result does not preclude SARS-Cov-2 infection and should not be used as the sole basis for treatment or other patient management decisions. A negative result may occur with  improper specimen collection/handling, submission of specimen other than nasopharyngeal swab, presence of viral mutation(s) within the areas targeted by this assay, and inadequate number of viral copies(<138 copies/mL). A  negative result must be combined with clinical observations, patient history, and epidemiological information. The expected result is Negative.  Fact Sheet for Patients:  BloggerCourse.com  Fact Sheet for Healthcare Providers:  SeriousBroker.it  This test is no t yet approved or cleared by the [place] FDA and  has been authorized for detection and/or diagnosis of SARS-CoV-2 by FDA under an Emergency Use Authorization (EUA). This EUA will remain  in effect (meaning this test can be used) for the duration of the COVID-19 declaration under Section 564(b)(1) of the Act, 21 U.S.C.section 360bbb-3(b)(1), unless the authorization is terminated  or revoked sooner.       Influenza A by PCR NEGATIVE NEGATIVE Final   Influenza B by PCR NEGATIVE NEGATIVE Final    Comment: (NOTE) The Xpert Xpress SARS-CoV-2/FLU/RSV plus assay is intended as an aid in the diagnosis of influenza from Nasopharyngeal swab specimens and should not be used as a sole basis for treatment. Nasal washings and aspirates are unacceptable for Xpert Xpress SARS-CoV-2/FLU/RSV testing.  Fact Sheet for Patients: BloggerCourse.com  Fact Sheet for Healthcare Providers: SeriousBroker.it  This test is not yet approved or cleared by the [place] FDA and has been authorized for detection and/or diagnosis of SARS-CoV-2 by FDA under an Emergency Use Authorization (EUA). This EUA will remain in effect (meaning this test can be used) for the duration of the COVID-19 declaration under Section 564(b)(1) of the Act, 21 U.S.C. section 360bbb-3(b)(1), unless the authorization is terminated or revoked.  Performed at [facility], [address] [place], [place] [address]   Blood Culture (routine x 2)     Status: None (Preliminary result)   Collection Time: 01/15/21  6:49 PM   Specimen: BLOOD  Result Value Ref  Range Status   Specimen Description BLOOD RIGHT ANTECUBITAL  Final   Special Requests   Final    BOTTLES DRAWN AEROBIC AND ANAEROBIC Blood Culture results may not be optimal due to an inadequate volume of blood received in culture bottles   Culture   Final    NO GROWTH 2 DAYS Performed at [facility], [address] [place], [place] [address]    Report Status PENDING  Incomplete  Blood Culture (routine x 2)     Status: None (Preliminary result)   Collection Time: 01/15/21  6:49 PM   Specimen: BLOOD  Result Value Ref Range Status   Specimen Description BLOOD BLOOD RIGHT HAND  Final   Special Requests   Final    BOTTLES DRAWN AEROBIC AND ANAEROBIC Blood Culture results may not be optimal due to an inadequate volume of blood received in culture bottles   Culture   Final    NO GROWTH 2 DAYS Performed at [facility], [address], [place], [place] [address]    Report Status PENDING  Incomplete  Urine culture     Status: Abnormal   Collection Time: 01/15/21  8:48 PM   Specimen: In/Out Cath Urine  Result Value Ref Range Status   Specimen Description   Final    IN/OUT CATH URINE Performed at [facility], [address], [place], [place] [address]  Special Requests   Final    NONE Performed at [facility], [address] [place], [place] [address]    Culture (A)  Final    <10,000 COLONIES/mL INSIGNIFICANT GROWTH Performed at [facility], [address], [place], [place] [address]    Report Status 01/17/2021 FINAL  Final  Aerobic Culture w Gram Stain (superficial specimen)     Status: None (Preliminary result)   Collection Time: 01/16/21  3:40 PM   Specimen: Wound  Result Value Ref Range Status   Specimen Description   Final    WOUND Performed at [facility], [address], [place], [place] [address]    Special Requests   Final    NONE Performed at [facility], [address] [place], [place]  [address]    Gram Stain   Final    NO WBC SEEN MODERATE GRAM POSITIVE COCCI Performed at [facility], [address], [place], [place] [address]    Culture PENDING  Incomplete   Report Status PENDING  Incomplete      Radiology Studies: CT TIBIA FIBULA LEFT W CONTRAST  Result Date: 01/16/2021 CLINICAL DATA:  Leg pain and swelling. EXAM: CT OF THE LOWER RIGHT EXTREMITY WITH CONTRAST TECHNIQUE: Multidetector CT imaging of the lower right extremity was performed according to the standard protocol following intravenous contrast administration. CONTRAST:  OMNIPAQUE IOHEXOL 350 MG/ML SOLN COMPARISON:  None. FINDINGS: Skin thickening and marked subcutaneous soft tissue swelling/edema/fluid involving the entire left lower extremity but most notably around the ankle and foot. Findings suggest cellulitis. I do not see a discrete rim enhancing fluid collection to suggest a drainable abscess. There is perifascial fluid surrounding the calf musculature along with some inter fascial fluid suggesting myofasciitis. No findings suspicious for pyomyositis. The bony structures are intact. No findings suspicious for osteomyelitis. No CT findings suspicious for septic arthritis involving the knee joint or ankle joint. The major vascular structures are patent. No findings suspicious for deep venous thrombosis. IMPRESSION: 1. Cellulitis and myofasciitis involving the left lower extremity but no findings suspicious for drainable soft tissue abscess or pyomyositis. 2. No CT findings suspicious for septic arthritis or osteomyelitis. Electronically Signed   By: Rudie Meyer M.D.   On: 01/16/2021 18:49   US Venous Img Lower Unilateral Left  Result Date: 01/15/2021 CLINICAL DATA:  Left lower extremity swelling and redness for 7 days. EXAM: Left LOWER EXTREMITY VENOUS DOPPLER ULTRASOUND TECHNIQUE: Gray-scale sonography with compression, as well as color and duplex ultrasound, were performed to evaluate the deep venous  system(s) from the level of the common femoral vein through the popliteal and proximal calf veins. COMPARISON:  None. FINDINGS: VENOUS Normal compressibility of the common femoral, superficial femoral, and popliteal veins, as well as the visualized calf veins. Visualized portions of profunda femoral vein and great saphenous vein unremarkable. No filling defects to suggest DVT on grayscale or color Doppler imaging. Doppler waveforms show normal direction of venous flow, normal respiratory plasticity and response to augmentation. Limited views of the contralateral common femoral vein are unremarkable. OTHER Incidental note of prominent lymph nodes in the left groin region. Morphology is benign suggesting reactive node. Limitations: Evaluation of the calf veins is somewhat limited technically due to soft tissue swelling and patient unable to tolerate pressure from the transducer. IMPRESSION: No evidence of acute deep venous thrombosis in the visualized lower extremity veins. Electronically Signed   By: Burman Nieves M.D.   On: 01/15/2021 21:34  DG Chest Port 1 View  Result Date: 01/15/2021 CLINICAL DATA:  Lower leg swelling and cellulitis for 1 week. Disorientation. Possible sepsis. EXAM: PORTABLE CHEST 1 VIEW COMPARISON:  Radiographs 03/22/2019 and 01/06/2019.  CT 07/26/2020. FINDINGS: 1825 hours. The heart size and mediastinal contours are normal. The lungs are clear. There is no pleural effusion or pneumothorax. No acute osseous findings are identified. IMPRESSION: Stable chest.  No active cardiopulmonary process. Electronically Signed   By: Carey Bullocks M.D.   On: 01/15/2021 19:00     LOS: 2 days   Baldwin Jamaica, MD [facility]  01/17/2021, 5:29 PM

## 2021-01-17 NOTE — Anesthesia Procedure Notes (Signed)
Procedure Name: Intubation Date/Time: 01/17/2021 12:13 PM Performed by: Gilford Raid, CRNA Pre-anesthesia Checklist: Patient identified, Patient being monitored, Timeout performed, Emergency Drugs available and Suction available Patient Re-evaluated:Patient Re-evaluated prior to induction Oxygen Delivery Method: Circle system utilized Preoxygenation: Pre-oxygenation with 100% oxygen Induction Type: IV induction Ventilation: Mask ventilation without difficulty Laryngoscope Size: 3 and McGraph Grade View: Grade I Tube type: Oral Tube size: 7.0 mm Number of attempts: 1 Airway Equipment and Method: Stylet Placement Confirmation: ETT inserted through vocal cords under direct vision, positive ETCO2 and breath sounds checked- equal and bilateral Secured at: 21 cm Tube secured with: Tape Dental Injury: Teeth and Oropharynx as per pre-operative assessment

## 2021-01-17 NOTE — Op Note (Addendum)
ATTENDING Surgeon(s): Carolan Shiver, MD   ANESTHESIA: General   PRE-OPERATIVE DIAGNOSIS: Left lower extremity necrotizing fasciitis   POST-OPERATIVE DIAGNOSIS: Left lower extremity necrotizing fasciitis   PROCEDURE(S): 1.) Sharp excisional debridement of left lower extremity 2.)  Negative pressure dressing DME placement   INTRAOPERATIVE FINDINGS:  Preoperatively there was a 60 cm long x25 cm wide open wound Postoperatively there was a 62 cm long x 30 cm wide 2 cm deep There was a pocket of falls in the lateral distal thigh that was drained Anterior leg skin started to demarcate ischemic tissue.  Obvious ischemic tissue was excised today.        ESTIMATED BLOOD LOSS: 50 mL    SPECIMENS: Left lower extremity necrotizing fasciitis tissue   COMPLICATIONS: None apparent   CONDITION AT END OF PROCEDURE: Hemodynamically stable and awake   INDICATIONS FOR PROCEDURE:  Patient is 60 year old female with necrotizing fasciitis. Patient with leukocytosis and crepitus on physical exam.      DETAILS OF PROCEDURE: After informed consent,patient was taken to the OR. Time out performed. General anesthesia induced. Patient placed on supine position. The left lower extremity was cleaned and draped in sterile fashion.  The open wounds were thoroughly irrigated with saline.  Necrotic skin was excised with electrocautery.  The base of the wound was debrided completely again with electrocautery and scissors.  A new pocket of pus was identified on the lateral distal thigh.  An incision was done on the lateral medial thigh.  Dissection was carried down to the fascia.  The muscle and the fascia was separated completely. Hemostasis was achieved.  I personally placed the complex negative pressure dressing.  Patient basically has 2 separate negative pressure dressing system 1 on the medial aspect and one of the lateral aspect.  Lateral negative pressure dressing has irrigation system.
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
## 2021-01-17 NOTE — Progress Notes (Signed)
ID Pt is sleeping after getting pain meds Pale She underwent further debridement today BP (!) 114/59 (BP Location: Left Arm)   Pulse 85   Temp 98.4 F (36.9 C)   Resp 18   Ht 5\' 6"  (1.676 m)   Wt 65.8 kg   SpO2 100%   BMI 23.40 kg/m   Chest b/l air entry Hss 1s2 Left leg- extensive debridement - now has wound vac          Labs CBC Latest Ref Rng & Units 01/17/2021 01/16/2021 01/15/2021  WBC 4.0 - 10.5 K/uL 41.8(H) 33.3(H) 26.3(H)  Hemoglobin 12.0 - 15.0 g/dL 10.7(L) 11.4(L) 12.8  Hematocrit 36.0 - 46.0 % 31.0(L) 31.9(L) 36.9  Platelets 150 - 400 K/uL 360 267 266     CMP Latest Ref Rng & Units 01/17/2021 01/16/2021 01/16/2021  Glucose 70 - 99 mg/dL 03/19/2021) 469(G) -  BUN 8 - 23 mg/dL 11 12 -  Creatinine 295(M - 1.00 mg/dL 8.41 3.24 4.01  Sodium 135 - 145 mmol/L 135 136 -  Potassium 3.5 - 5.1 mmol/L 3.7 3.1(L) -  Chloride 98 - 111 mmol/L 103 101 -  CO2 22 - 32 mmol/L 26 27 -  Calcium 8.9 - 10.3 mg/dL 8.3(L) 8.4(L) -  Total Protein 6.5 - 8.1 g/dL - - -  Total Bilirubin 0.3 - 1.2 mg/dL - - -  Alkaline Phos 38 - 126 U/L - - -  AST 15 - 41 U/L - - -  ALT 0 - 44 U/L - - -     Micro Wound culture- gram stain gram positive cocci  BC 01/15/21 - NG  Impression/recommendation  Necrotizing fascitis of the left lower extremity S/p debridement X 2 and placement of wound vac Severe leucocytosis due to the above Pt currently on broad spectrum antibiotics- vanco/cefepime /clindamycin 'possible organism could be group a /B strep Await culture to de-escalate  Anemia  Discussed with the care team

## 2021-01-17 NOTE — Anesthesia Postprocedure Evaluation (Signed)
Anesthesia Post Note  Patient: Kaylee [name]  Procedure(s) Performed: INCISION AND DRAINAGE ABSCESS (Left) APPLICATION OF WOUND VAC (Left)  Patient location during evaluation: PACU Anesthesia Type: General Level of consciousness: awake and alert Pain management: pain level controlled Vital Signs Assessment: post-procedure vital signs reviewed and stable Respiratory status: spontaneous breathing, nonlabored ventilation, respiratory function stable and patient connected to nasal cannula oxygen Cardiovascular status: blood pressure returned to baseline and stable Postop Assessment: no apparent nausea or vomiting Anesthetic complications: no   No notable events documented.   Last Vitals:  Vitals:   01/17/21 1415 01/17/21 1438  BP: 101/63 98/63  Pulse: 88 82  Resp: 20 16  Temp: (!) 36.2 C 36.8 C  SpO2: 94% 100%    Last Pain:  Vitals:   01/17/21 1415  TempSrc:   PainSc: Asleep                 Cleda Mccreedy Lynn Recendiz
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
## 2021-01-17 NOTE — Progress Notes (Signed)
Unable to document output from lateral wound vac due to malfunction, rep contacted and malfunction was fixed. Output will be documented from this point.

## 2021-01-17 NOTE — Progress Notes (Signed)
Pt seen  Full note to follow Left leg cellulitis with likely nec fasc  H/O trichotillomania Also manipulates the left leg to express worms Pt is going for surgery Currently on vanco/cefepime+flagyl- change the latter to clinda

## 2021-01-17 NOTE — Progress Notes (Signed)
Patient ID: Kaylee [name], female   DOB: 1961/05/16, 60 y.o.   MRN: [id number]     SURGICAL PROGRESS NOTE   Hospital Day(s): 2.   Interval History: Patient seen and examined, no acute events or new complaints overnight. Patient reports having a lot of pain on the left leg.  Pain does not radiate to other part of the body.  There is no alleviating or rating factors.  Significant amount of drainage from the left leg.  Vital signs in last 24 hours: [min-max] current  Temp:  [97.3 F (36.3 C)-100.2 F (37.9 C)] 97.9 F (36.6 C) (07/08 0530) Pulse Rate:  [81-97] 87 (07/08 0816) Resp:  [13-28] 18 (07/08 0816) BP: (91-118)/(46-68) 93/60 (07/08 0816) SpO2:  [95 %-100 %] 99 % (07/08 0816)     Height: 5\' 6"  (167.6 cm) Weight: 65.8 kg BMI (Calculated): 23.41   Physical Exam:  Constitutional: Sleepy but arousable, oriented in person, place Extremity: Severe edema on the left leg.  Wound and serous drainage.  Dressing in place will remove in the OR due to severe pain.  Labs:  CBC Latest Ref Rng & Units 01/17/2021 01/16/2021 01/15/2021  WBC 4.0 - 10.5 K/uL 41.8(H) 33.3(H) 26.3(H)  Hemoglobin 12.0 - 15.0 g/dL 10.7(L) 11.4(L) 12.8  Hematocrit 36.0 - 46.0 % 31.0(L) 31.9(L) 36.9  Platelets 150 - 400 K/uL 360 267 266   CMP Latest Ref Rng & Units 01/17/2021 01/16/2021 01/16/2021  Glucose 70 - 99 mg/dL 03/19/2021) 518(A) -  BUN 8 - 23 mg/dL 11 12 -  Creatinine 416(S - 1.00 mg/dL 0.63 0.16 0.10  Sodium 135 - 145 mmol/L 135 136 -  Potassium 3.5 - 5.1 mmol/L 3.7 3.1(L) -  Chloride 98 - 111 mmol/L 103 101 -  CO2 22 - 32 mmol/L 26 27 -  Calcium 8.9 - 10.3 mg/dL 8.3(L) 8.4(L) -  Total Protein 6.5 - 8.1 g/dL - - -  Total Bilirubin 0.3 - 1.2 mg/dL - - -  Alkaline Phos 38 - 126 U/L - - -  AST 15 - 41 U/L - - -  ALT 0 - 44 U/L - - -    Imaging studies: No new pertinent imaging studies   Assessment/Plan:  60 y.o. female with necrotizing fasciitis of the left lower extremity 1 Day Post-Op s/p extensive cleansing  and debridement, complicated by pertinent comorbidities including COPD, bipolar type I.  Patient with extensive debridement due to severe necrotizing fasciitis of the whole left leg up to mid thigh.  Due to the extensive and complicated wound and severe pain and the possibility of having more necrotic tissue will take the patient to the OR today for a second look for further debridement and local care.  Patient was oriented about the plan and she agreed.  I asked the patient if she wanted me to notify any family member and she refused to let me talk to any family members at this moment.  Continue current antibiotic therapy and medical management as per primary team.  77, MD

## 2021-01-17 NOTE — Anesthesia Postprocedure Evaluation (Signed)
Anesthesia Post Note  Patient: Kaylee [name]  Procedure(s) Performed: irrigation and debridement left leg (Left)  Patient location during evaluation: PACU Anesthesia Type: General Level of consciousness: awake and alert Pain management: pain level controlled Vital Signs Assessment: post-procedure vital signs reviewed and stable Respiratory status: spontaneous breathing, nonlabored ventilation, respiratory function stable and patient connected to nasal cannula oxygen Cardiovascular status: blood pressure returned to baseline and stable Postop Assessment: no apparent nausea or vomiting Anesthetic complications: no   No notable events documented.   Last Vitals:  Vitals:   01/17/21 0600 01/17/21 0630  BP: (!) 94/47 (!) 101/52  Pulse: 90 90  Resp:    Temp:    SpO2:      Last Pain:  Vitals:   01/17/21 0530  TempSrc: Axillary  PainSc: Asleep                 Lenard Simmer

## 2021-01-17 NOTE — Care Management Important Message (Signed)
Important Message  Patient Details  Name: Kaylee [name] MRN: [id number] Date of Birth: 03-Jan-1961   Medicare Important Message Given:  N/A - LOS <3 / Initial given by admissions     Olegario Messier A Maximina Pirozzi 01/17/2021, 8:54 AM

## 2021-01-17 NOTE — Transfer of Care (Signed)
Immediate Anesthesia Transfer of Care Note  Patient: Kaylee [name]  Procedure(s) Performed: INCISION AND DRAINAGE ABSCESS (Left) APPLICATION OF WOUND VAC (Left)  Patient Location: PACU  Anesthesia Type:General  Level of Consciousness: awake, alert  and oriented  Airway & Oxygen Therapy: Patient Spontanous Breathing and Patient connected to face mask oxygen  Post-op Assessment: Report given to RN and Post -op Vital signs reviewed and stable  Post vital signs: Reviewed and stable  Last Vitals:  Vitals Value Taken Time  BP 159/66 01/17/21 1347  Temp    Pulse 87 01/17/21 1353  Resp 16 01/17/21 1353  SpO2 98 % 01/17/21 1353  Vitals shown include unvalidated device data.  Last Pain:  Vitals:   01/17/21 1109  TempSrc: Oral  PainSc:          Complications: No notable events documented.

## 2021-01-17 NOTE — Progress Notes (Signed)
   01/17/21 0200  Assess: MEWS Score  Temp 97.9 F (36.6 C)  BP (!) 91/49  Pulse Rate 86  Resp 17  Level of Consciousness Responds to Voice  SpO2 100 %  O2 Device Room Air  Assess: MEWS Score  MEWS Temp 0  MEWS Systolic 1  MEWS Pulse 0  MEWS RR 0  MEWS LOC 1  MEWS Score 2  MEWS Score Color Yellow  Assess: if the MEWS score is Yellow or Red  Were vital signs taken at a resting state? Yes  Focused Assessment No change from prior assessment  Does the patient meet 2 or more of the SIRS criteria? No  MEWS guidelines implemented *See Row Information* Yes  Treat  MEWS Interventions Administered scheduled meds/treatments  Pain Scale 0-10  Pain Score Asleep  Take Vital Signs  Increase Vital Sign Frequency  Yellow: Q 2hr X 2 then Q 4hr X 2, if remains yellow, continue Q 4hrs  Escalate  MEWS: Escalate Yellow: discuss with charge nurse/RN and consider discussing with provider and RRT  Notify: Charge Nurse/RN  Name of Charge Nurse/RN Notified Corrie Dandy  Date Charge Nurse/RN Notified 01/17/21  Time Charge Nurse/RN Notified 0200  Document  Patient Outcome Other (Comment) (No changes)  Progress note created (see row info) Yes  Assess: SIRS CRITERIA  SIRS Temperature  0  SIRS Pulse 0  SIRS Respirations  0  SIRS WBC 0  SIRS Score Sum  0
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
## 2021-01-18 ENCOUNTER — Encounter: Payer: Self-pay | Admitting: General Surgery

## 2021-01-18 DIAGNOSIS — L03116 Cellulitis of left lower limb: Secondary | ICD-10-CM | POA: Diagnosis not present

## 2021-01-18 DIAGNOSIS — M726 Necrotizing fasciitis: Secondary | ICD-10-CM | POA: Diagnosis not present

## 2021-01-18 LAB — BASIC METABOLIC PANEL
Anion gap: 4 — ABNORMAL LOW (ref 5–15)
BUN: 13 mg/dL (ref 8–23)
CO2: 28 mmol/L (ref 22–32)
Calcium: 8.1 mg/dL — ABNORMAL LOW (ref 8.9–10.3)
Chloride: 102 mmol/L (ref 98–111)
Creatinine, Ser: 0.49 mg/dL (ref 0.44–1.00)
GFR, Estimated: >60 mL/min (ref >60)
Glucose, Bld: 155 mg/dL — ABNORMAL HIGH (ref 70–99)
Potassium: 3.5 mmol/L (ref 3.5–5.1)
Sodium: 134 mmol/L — ABNORMAL LOW (ref 135–145)

## 2021-01-18 LAB — CBC WITH DIFFERENTIAL/PLATELET
Abs Immature Granulocytes: 1.1 10*3/uL — ABNORMAL HIGH (ref 0.00–0.07)
Basophils Absolute: 0.1 10*3/uL (ref 0.0–0.1)
Basophils Relative: 0 %
Eosinophils Absolute: 0 10*3/uL (ref 0.0–0.5)
Eosinophils Relative: 0 %
HCT: 26.6 % — ABNORMAL LOW (ref 36.0–46.0)
Hemoglobin: 9.3 g/dL — ABNORMAL LOW (ref 12.0–15.0)
Immature Granulocytes: 4 %
Lymphocytes Relative: 7 %
Lymphs Abs: 2.2 10*3/uL (ref 0.7–4.0)
MCH: 30.4 pg (ref 26.0–34.0)
MCHC: 35 g/dL (ref 30.0–36.0)
MCV: 86.9 fL (ref 80.0–100.0)
Monocytes Absolute: 1.4 10*3/uL — ABNORMAL HIGH (ref 0.1–1.0)
Monocytes Relative: 5 %
Neutro Abs: 25.5 10*3/uL — ABNORMAL HIGH (ref 1.7–7.7)
Neutrophils Relative %: 84 %
Platelets: 360 10*3/uL (ref 150–400)
RBC: 3.06 MIL/uL — ABNORMAL LOW (ref 3.87–5.11)
RDW: 14.9 % (ref 11.5–15.5)
Smear Review: NORMAL
WBC: 30.3 10*3/uL — ABNORMAL HIGH (ref 4.0–10.5)
nRBC: 0 % (ref 0.0–0.2)

## 2021-01-18 LAB — CK: Total CK: 18 U/L — ABNORMAL LOW (ref 38–234)

## 2021-01-18 LAB — LACTIC ACID, PLASMA: Lactic Acid, Venous: 1.4 mmol/L (ref 0.5–1.9)

## 2021-01-18 MED ORDER — HYDROMORPHONE HCL 1 MG/ML IJ SOLN
0.5000 mg | INTRAMUSCULAR | Status: DC | PRN
  Administered 2021-01-18: 0.5 mg via INTRAVENOUS
  Filled 2021-01-18: qty 1

## 2021-01-18 MED ORDER — CEFAZOLIN SODIUM-DEXTROSE 2-4 GM/100ML-% IV SOLN
2.0000 g | Freq: Three times a day (TID) | INTRAVENOUS | Status: DC
  Administered 2021-01-19: 2 g via INTRAVENOUS
  Filled 2021-01-18: qty 100

## 2021-01-18 MED ORDER — DIAZEPAM 2 MG PO TABS
2.0000 mg | ORAL_TABLET | Freq: Three times a day (TID) | ORAL | Status: DC
  Administered 2021-01-18 – 2021-01-19 (×2): 2 mg via ORAL
  Filled 2021-01-18 (×2): qty 1

## 2021-01-18 MED ORDER — DIAZEPAM 2 MG PO TABS
2.0000 mg | ORAL_TABLET | Freq: Two times a day (BID) | ORAL | Status: DC | PRN
  Administered 2021-01-18: 2 mg via ORAL
  Filled 2021-01-18: qty 1

## 2021-01-18 NOTE — Progress Notes (Signed)
Patient ID: Kaylee [name], female   DOB: 1960-10-07, 60 y.o.   MRN: [id number]     SURGICAL PROGRESS NOTE   Hospital Day(s): 3.   Interval History: Patient seen and examined, no acute events or new complaints overnight. Patient reports feeling better.  Today the patient is more alert.  Patient was able to understand the severity of the leg infection.  Couple of issues with the wound VAC that were able to be resolved.  White blood cell today decreased to 30,000 from 41,000.  No fever.  Vital signs in last 24 hours: [min-max] current  Temp:  [97.2 F (36.2 C)-98.9 F (37.2 C)] 98.1 F (36.7 C) (07/09 0300) Pulse Rate:  [73-88] 73 (07/09 0300) Resp:  [15-20] 18 (07/09 0300) BP: (93-159)/(53-66) 110/60 (07/09 0300) SpO2:  [94 %-100 %] 98 % (07/09 0300)     Height: 5\' 6"  (167.6 cm) Weight: 65.8 kg BMI (Calculated): 23.41   Physical Exam:  Constitutional: alert, cooperative and no distress  Extremity: Left lower extremity with negative pressure dressing in place.  There is a minimal increase in ischemic tissue on the distal left leg.  No sign of purulence.  Labs:  CBC Latest Ref Rng & Units 01/18/2021 01/17/2021 01/16/2021  WBC 4.0 - 10.5 K/uL 30.3(H) 41.8(H) 33.3(H)  Hemoglobin 12.0 - 15.0 g/dL 03/19/2021) 10.7(L) 11.4(L)  Hematocrit 36.0 - 46.0 % 26.6(L) 31.0(L) 31.9(L)  Platelets 150 - 400 K/uL 360 360 267   CMP Latest Ref Rng & Units 01/18/2021 01/17/2021 01/16/2021  Glucose 70 - 99 mg/dL 03/19/2021) 389(H) 734(K)  BUN 8 - 23 mg/dL 13 11 12   Creatinine 0.44 - 1.00 mg/dL 876(O 1.15  Sodium 135 - 145 mmol/L 134(L) 135 136  Potassium 3.5 - 5.1 mmol/L 3.5 3.7 3.1(L)  Chloride 98 - 111 mmol/L 102 103 101  CO2 22 - 32 mmol/L 28 26 27   Calcium 8.9 - 10.3 mg/dL 8.1(L) 8.3(L) 8.4(L)  Total Protein 6.5 - 8.1 g/dL - - -  Total Bilirubin 0.3 - 1.2 mg/dL - - -  Alkaline Phos 38 - 126 U/L - - -  AST 15 - 41 U/L - - -  ALT 0 - 44 U/L - - -    Imaging studies: No new pertinent imaging  studies   Assessment/Plan:  60 y.o. female with necrotizing fasciitis of the left lower extremity 2 Day Post-Op s/p extensive cleansing and debridement, complicated by pertinent comorbidities including COPD, bipolar type I.  Patient today much more alert.  10 negative pressure dressing seems to be working adequately.  The white blood cell count start to decrease significantly.  There has been no fever.  There is no significant increase in the suspected ischemic tissue at this moment.  I will continue with negative pressure dressing with the plan to change negative pressure dressing in the operating room and possible debridement on Monday.  Continue current antibiotic therapy as per hospitalist and infectious disease recommendations.  We will continue to follow.  , MD

## 2021-01-18 NOTE — Progress Notes (Addendum)
PROGRESS NOTE    Davetta Olliff Rentz  DTO:[id number] DOB: 11/11/1960 DOA: 01/15/2021 PCP: [name], MD   Chief Complaint  Patient presents with   Cellulitis     Brief Narrative: 60 year old female with PMH of Diabetes, Trichtollomania, and Polysubstance Abuse including (Cocaine and Methamphetamine abuse related skin picking), and chronic LLE skin infections presents to the ED with significant cellulitis of the LLE with small bullae, Leukocytosis up to 30-40K, and Lactic Acidosis up to 2.8 mmol/L found to have significant necrotizing fasciitis s/p 7/7 and 7/8 wound debridement.  Subjective: Vitals are stable.  Wounds are doing well with negative pressure wound therapy.  Leg remains significantly swollen and tender.  Patient is looking better today overall.  She continues to pick at her skin.  Of note, daughter tells me patient has a long history of methamphetamine use with skin picking as well as a chronic psychotic disorder that lead to recurrent skin infections of the leg.  Assessment & Plan: Principal Problem:   Cellulitis of left leg Active Problems:   Chronic pain syndrome   GERD (gastroesophageal reflux disease)   Bipolar 1 disorder (HCC)   COPD (chronic obstructive pulmonary disease) (HCC)   Sepsis (HCC)   History of parasitic infection  Illicit Drug Use (opiates, benzos, amphetamine, cocaine) - Intravenous Use and "skin popping" and neglect of hygiene likely predisposed patient to severe skin infection. Her mental status indicates deficits in neuropsychiatric function (including executive and motor function, mood instability, delusions of formication or infestation with bugs resulting in constant picking, and occasional non goal directed repetitive activity) which is highly suspicious of chronic daily methamphetamine abuse.   - There are no proven pharmacologic therapies to reduce cravings or manage stimulant use disorder. - 2021 NEJM trial used combination  extended release Naltrexone (theoretically reduces cravings) and oral doses of Bupropion (antidepressant effect)- this is a regimen we can try when patient is off of narcotics in combination with behavioral therapy. - HIV and Hepatitis Screen were negative.  Substance Induced Mood Disorder / Bipolar Depression: - Increase Abilify to 15 mg daily (partial agonist of dopamine and serotonin).  We will titrate up to 30 mg daily as tolerated for optimal response. - Continue Celexa at higher dose 20 mg daily for antidepressant and synergistic effect. - Schedule Valium 2 mg TID for anxiety.  Increase this to 6 mg TID as tolerated to reduce substance withdrawal symptoms.  Severe Necrotizing Fasciitis of the Left Lower Extremity s/p 7/7 and 7/8 Wound Debridement:  - She is doing ok with negative pressure wound therapy.  - Patient may benefit from hyperbaric oxygen at tertiary center - let's see how she does. - General Surgery is following, appreciate assistance and recommendations. - Continue IV Vancomycin Cefepime and Clindamycin. - Infectious Disease is following, appreciate assistance and recommendations. - Follow up on Surgical Cultures.  Would not stop Vancomycin given patient's history of MRSA infections. - 7/7 Superficial Cultures are growing Staph, and Strep species.  Susceptibilities are pending. - 7/6 Blood Cultures are NG x 72 hours. - Repeat CT Scan of LLE if needed.  Significant Leukocytosis: WBC improved to 33K. - Repeat debridement showed no fluid collection. - Peripheral smear showed no abnormal cytology. - 7/6 Blood Cultures show NG. - Monitor.  Lactic Acidosis, resolved:  Hypokalemia, replaced: - Monitor electrolytes.  GERD:  - Continue IV Protonix 40 mg daily for stress prophylaxis.  Chronic COPD, not in acute exacerbation: - Inhalers PRN  Diet Order  Diet regular Room service appropriate? Yes; Fluid consistency: Thin  Diet effective now                             DVT prophylaxis: enoxaparin (LOVENOX) injection 40 mg Start: 01/15/21 2230 Code Status:   Code Status: Full Code  Family Communication: plan of care discussed with patient at bedside.  Status is: Inpatient  Remains inpatient appropriate because:Inpatient level of care appropriate due to severity of illness  Dispo: The patient is from: Home              Anticipated d/c is to: Home              Patient currently is not medically stable to d/c.   Difficult to place patient No     Unresulted Labs (From admission, onward)     Start     Ordered   01/22/21 0500  Creatinine, serum  (enoxaparin (LOVENOX)    CrCl >/= 30 ml/min)  Weekly,   STAT     Comments: while on enoxaparin therapy    01/15/21 2229   01/17/21 0500  Basic metabolic panel  Daily,   R     Question:  Specimen collection method  Answer:  Lab=Lab collect   01/16/21 2146   01/17/21 0500  CBC with Differential/Platelet  Daily,   R     Question:  Specimen collection method  Answer:  Lab=Lab collect   01/16/21 2146             Medications reviewed:  Scheduled Meds:  ARIPiprazole  10 mg Oral Daily   Chlorhexidine Gluconate Cloth  6 each Topical Daily   citalopram  20 mg Oral Daily   enoxaparin (LOVENOX) injection  40 mg Subcutaneous Q24H   pantoprazole (PROTONIX) IV  40 mg Intravenous QHS   Continuous Infusions:  [START ON 01/19/2021]  ceFAZolin (ANCEF) IV     ceFEPime (MAXIPIME) IV 2 g (01/18/21 1306)   clindamycin (CLEOCIN) IV 900 mg (01/18/21 1508)   lactated ringers 100 mL/hr at 01/18/21 0809   vancomycin 750 mg (01/18/21 0953)    Consultants:see note  Procedures:see note  Antimicrobials: Anti-infectives (From admission, onward)    Start     Dose/Rate Route Frequency Ordered Stop   01/19/21 0600  ceFAZolin (ANCEF) IVPB 2g/100 mL premix        2 g 200 mL/hr over 30 Minutes Intravenous Every 8 hours 01/18/21 1313     01/16/21 2200  clindamycin (CLEOCIN) IVPB 900 mg        900 mg 100  mL/hr over 30 Minutes Intravenous Every 8 hours 01/16/21 1914     01/16/21 2100  vancomycin (VANCOCIN) IVPB 1000 mg/200 mL premix  Status:  Discontinued        1,000 mg 200 mL/hr over 60 Minutes Intravenous Every 24 hours 01/15/21 2246 01/16/21 0937   01/16/21 2000  metroNIDAZOLE (FLAGYL) IVPB 500 mg  Status:  Discontinued        500 mg 100 mL/hr over 60 Minutes Intravenous Every 8 hours 01/16/21 1908 01/16/21 1914   01/16/21 1030  vancomycin (VANCOREADY) IVPB 750 mg/150 mL        750 mg 150 mL/hr over 60 Minutes Intravenous 2 times daily 01/16/21 0937 01/18/21 2359   01/16/21 0300  ceFEPIme (MAXIPIME) 2 g in sodium chloride 0.9 % 100 mL IVPB        2 g 200 mL/hr over 30 Minutes Intravenous  Every 8 hours 01/15/21 2242 01/18/21 2359   01/15/21 1900  vancomycin (VANCOREADY) IVPB 1500 mg/300 mL        1,500 mg 150 mL/hr over 120 Minutes Intravenous  Once 01/15/21 1825 01/15/21 2302   01/15/21 1830  vancomycin (VANCOCIN) IVPB 1000 mg/200 mL premix  Status:  Discontinued        1,000 mg 200 mL/hr over 60 Minutes Intravenous  Once 01/15/21 1822 01/15/21 1825   01/15/21 1830  cefTRIAXone (ROCEPHIN) 2 g in sodium chloride 0.9 % 100 mL IVPB        2 g 200 mL/hr over 30 Minutes Intravenous  Once 01/15/21 1822 01/15/21 1923      Culture/Microbiology    Component Value Date/Time   SDES  01/16/2021 1540    WOUND Performed at [facility], [address], [place], [place] [address]    [facility]  01/16/2021 1540    NONE Performed at [facility], [address] [place], [place] [address]    CULT  01/16/2021 1540    MODERATE STAPHYLOCOCCUS AUREUS SUSCEPTIBILITIES TO FOLLOW MODERATE STREPTOCOCCUS PYOGENES Beta hemolytic streptococci are predictably susceptible to penicillin and other beta lactams. Susceptibility testing not routinely performed. Performed at [facility], [address], [place], [place] [address]    REPTSTATUS PENDING 01/16/2021 1540    Other  culture-see note  Objective: Vitals: Today's Vitals   01/18/21 1204 01/18/21 1510 01/18/21 1524 01/18/21 1543  BP:   123/63   Pulse:   84   Resp:   16   Temp:   98.6 F (37 C)   TempSrc:      SpO2:   99%   Weight:      Height:      PainSc: Asleep 5   Asleep    Intake/Output Summary (Last 24 hours) at 01/18/2021 1643 Last data filed at 01/18/2021 1340 Gross per 24 hour  Intake 240 ml  Output 1450 ml  Net -1210 ml    Filed Weights   01/15/21 1600  Weight: 65.8 kg   Weight change:   Intake/Output from previous day: 07/08 0701 - 07/09 0700 In: 700 [I.V.:700] Out: 2055 [Urine:2050; Blood:5] Intake/Output this shift: Total I/O In: 240 [P.O.:240] Out: -  Filed Weights   01/15/21 1600  Weight: 65.8 kg    Examination: General exam: alert and oriented, occasionally makes paranoid statements, displays repetitive behaviors  HEENT: dental caries Respiratory system: CTAB no WRR Cardiovascular system: Did not appreciate a murmur, regular, No JVD. Gastrointestinal system: No flank pain, Abdomen soft, NT,ND, BS+. Nervous System: No focal deficits. Extremities: No edema, distal peripheral pulses palpable.  Skin: LLE wound vac, multiple skin pocks on upper extremities MSK: stable  Data Reviewed: I have personally reviewed following labs and imaging studies CBC: Recent Labs  Lab 01/15/21 1605 01/16/21 0600 01/17/21 0701 01/18/21 0510  WBC 26.3* 33.3* 41.8* 30.3*  NEUTROABS 22.9*  --  37.5* 25.5*  HGB 12.8 11.4* 10.7* 9.3*  HCT 36.9 31.9* 31.0* 26.6*  MCV 87.0 86.0 85.6 86.9  PLT 266 267 360 360    Basic Metabolic Panel: Recent Labs  Lab 01/15/21 1605 01/16/21 0027 01/16/21 0600 01/17/21 0701 01/18/21 0510  NA 131*  --  136 135 134*  K 3.0*  --  3.1* 3.7 3.5  CL 98  --  101 103 102  CO2 24  --  GLUCOSE 132*  --  109* 168* 155*  BUN 18  --  12  11 13  CREATININE 0.89 0.89 0.73 0.51 0.49  CALCIUM 9.0  --  8.4* 8.3* 8.1*  MG  --   --   --  1.8  --    PHOS  --   --   --  3.3  --      Liver Function Tests: Recent Labs  Lab 01/15/21 1605  AST 17  ALT 14  ALKPHOS 114  BILITOT 0.5  PROT 6.4*  ALBUMIN 2.6*     Coagulation Profile: Recent Labs  Lab 01/15/21 1849  INR 1.0     Sepsis Labs: Recent Labs  Lab 01/15/21 2048 01/16/21 0027 01/17/21 0701 01/18/21 0510  LATICACIDVEN 2.7* 2.8* 1.7 1.4     Recent Results (from the past 240 hour(s))  Resp Panel by RT-PCR (Flu A&B, Covid) Nasopharyngeal Swab     Status: None   Collection Time: 01/15/21  6:49 PM   Specimen: Nasopharyngeal Swab; Nasopharyngeal(NP) swabs in vial transport medium  Result Value Ref Range Status   SARS Coronavirus 2 by RT PCR NEGATIVE NEGATIVE Final    Comment: (NOTE) SARS-CoV-2 target nucleic acids are NOT DETECTED.  The SARS-CoV-2 RNA is generally detectable in upper respiratory specimens during the acute phase of infection. The lowest concentration of SARS-CoV-2 viral copies this assay can detect is 138 copies/mL. A negative result does not preclude SARS-Cov-2 infection and should not be used as the sole basis for treatment or other patient management decisions. A negative result may occur with  improper specimen collection/handling, submission of specimen other than nasopharyngeal swab, presence of viral mutation(s) within the areas targeted by this assay, and inadequate number of viral copies(<138 copies/mL). A negative result must be combined with clinical observations, patient history, and epidemiological information. The expected result is Negative.  Fact Sheet for Patients:  BloggerCourse.com  Fact Sheet for Healthcare Providers:  SeriousBroker.it  This test is no t yet approved or cleared by the [place] FDA and  has been authorized for detection and/or diagnosis of SARS-CoV-2 by FDA under an Emergency Use Authorization (EUA). This EUA will remain  in effect (meaning this  test can be used) for the duration of the COVID-19 declaration under Section 564(b)(1) of the Act, 21 U.S.C.section 360bbb-3(b)(1), unless the authorization is terminated  or revoked sooner.       Influenza A by PCR NEGATIVE NEGATIVE Final   Influenza B by PCR NEGATIVE NEGATIVE Final    Comment: (NOTE) The Xpert Xpress SARS-CoV-2/FLU/RSV plus assay is intended as an aid in the diagnosis of influenza from Nasopharyngeal swab specimens and should not be used as a sole basis for treatment. Nasal washings and aspirates are unacceptable for Xpert Xpress SARS-CoV-2/FLU/RSV testing.  Fact Sheet for Patients: BloggerCourse.com  Fact Sheet for Healthcare Providers: SeriousBroker.it  This test is not yet approved or cleared by the [place] FDA and has been authorized for detection and/or diagnosis of SARS-CoV-2 by FDA under an Emergency Use Authorization (EUA). This EUA will remain in effect (meaning this test can be used) for the duration of the COVID-19 declaration under Section 564(b)(1) of the Act, 21 U.S.C. section 360bbb-3(b)(1), unless the authorization is terminated or revoked.  Performed at [facility], [address] [place], [place] [address]   Blood Culture (routine x 2)     Status: None (Preliminary result)   Collection Time: 01/15/21  6:49 PM   Specimen: BLOOD  Result Value Ref Range Status   Specimen Description BLOOD RIGHT ANTECUBITAL  Final   Special Requests  Final    BOTTLES DRAWN AEROBIC AND ANAEROBIC Blood Culture results may not be optimal due to an inadequate volume of blood received in culture bottles   Culture   Final    NO GROWTH 3 DAYS Performed at [facility], [address] [place], [place] [address]    Report Status PENDING  Incomplete  Blood Culture (routine x 2)     Status: None (Preliminary result)   Collection Time: 01/15/21  6:49 PM   Specimen: BLOOD  Result Value  Ref Range Status   Specimen Description BLOOD BLOOD RIGHT HAND  Final   Special Requests   Final    BOTTLES DRAWN AEROBIC AND ANAEROBIC Blood Culture results may not be optimal due to an inadequate volume of blood received in culture bottles   Culture   Final    NO GROWTH 3 DAYS Performed at [facility], [address], [place], [place] [address]    Report Status PENDING  Incomplete  Urine culture     Status: Abnormal   Collection Time: 01/15/21  8:48 PM   Specimen: In/Out Cath Urine  Result Value Ref Range Status   Specimen Description   Final    IN/OUT CATH URINE Performed at [facility], [address], [place], [place] [address]    Special Requests   Final    NONE Performed at [facility], [address], [place], [place] [address]    Culture (A)  Final    <10,000 COLONIES/mL INSIGNIFICANT GROWTH Performed at [facility], [address] [place], [place] [address]    Report Status 01/17/2021 FINAL  Final  Aerobic Culture w Gram Stain (superficial specimen)     Status: None (Preliminary result)   Collection Time: 01/16/21  3:40 PM   Specimen: Wound  Result Value Ref Range Status   Specimen Description   Final    WOUND Performed at [facility], [address], [place], [place] [address]    Special Requests   Final    NONE Performed at [facility], [address] [place], [place] [address]    Gram Stain NO WBC SEEN MODERATE GRAM POSITIVE COCCI   Final   Culture   Final    MODERATE STAPHYLOCOCCUS AUREUS SUSCEPTIBILITIES TO FOLLOW MODERATE STREPTOCOCCUS PYOGENES Beta hemolytic streptococci are predictably susceptible to penicillin and other beta lactams. Susceptibility testing not routinely performed. Performed at [facility], [address], [place], [place] [address]    Report Status PENDING  Incomplete      Radiology Studies: No results found.   LOS: 3 days   Baldwin Jamaica, MD [facility]  01/18/2021, 4:43 PM

## 2021-01-18 NOTE — Progress Notes (Signed)
ID  Pt more awake She says she has had swelling of her left  leg  for few months and then developed pus blisters- says she has cleaned with kerosene - she changes her story -What she says does not make sense No fever Pain left leg present No sob No nasuea , vomiting or diarrhea  Obejective Patient Vitals for the past 24 hrs:  BP Temp Temp src Pulse Resp SpO2  01/18/21 1114 115/68 98.1 F (36.7 C) -- 77 15 100 %  01/18/21 0717 120/66 97.8 F (36.6 C) Oral 80 17 100 %  01/18/21 0300 110/60 98.1 F (36.7 C) Oral 73 18 98 %  01/17/21 2002 (!) 114/59 98.4 F (36.9 C) -- 85 18 100 %  01/17/21 1900 (!) 109/57 98.2 F (36.8 C) -- 87 16 100 %  01/17/21 1539 106/61 97.7 F (36.5 C) -- 84 18 98 %  01/17/21 1438 98/63 98.3 F (36.8 C) -- 82 16 100 %  01/17/21 1415 101/63 (!) 97.2 F (36.2 C) -- 88 20 94 %  01/17/21 1400 107/60 -- -- 88 15 95 %  01/17/21 1355 -- -- -- 88 15 99 %  01/17/21 1347 (!) 159/66 (!) 97.3 F (36.3 C) -- 86 17 100 %    Awake Chest b/l air entry [id number] Abd soft Left leg wound vac covering the the surgical sites Leg swollen with bluish discoloration on the edges      Labs CBC Latest Ref Rng & Units 01/18/2021 01/17/2021 01/16/2021  WBC 4.0 - 10.5 K/uL 30.3(H) 41.8(H) 33.3(H)  Hemoglobin 12.0 - 15.0 g/dL 3.7(S) 10.7(L) 11.4(L)  Hematocrit 36.0 - 46.0 % 26.6(L) 31.0(L) 31.9(L)  Platelets 150 - 400 K/uL 360 360 267    CMP Latest Ref Rng & Units 01/18/2021 01/17/2021 01/16/2021  Glucose 70 - 99 mg/dL 283(T) 517(O) 160(V)  BUN 8 - 23 mg/dL 13 11 12   Creatinine 0.44 - 1.00 mg/dL 3.71 0.62  Sodium 135 - 145 mmol/L 134(L) 135 136  Potassium 3.5 - 5.1 mmol/L 3.5 3.7 3.1(L)  Chloride 98 - 111 mmol/L 102 103 101  CO2 22 - 32 mmol/L 28 26 27   Calcium 8.9 - 10.3 mg/dL 8.1(L) 8.3(L) 8.4(L)  Total Protein 6.5 - 8.1 g/dL - - -  Total Bilirubin 0.3 - 1.2 mg/dL - - -  Alkaline Phos 38 - 126 U/L - - -  AST 15 - 41 U/L - - -  ALT 0 - 44 U/L - - -    Micro BC - NG 7/7  WC- gram positive cocci- called lab- they will be reading the plate today   Impression/recommendation  Necrotizing fascitis/ severe skin and soft tissue infection of the left leg upto her thigh- filleting of the leg tissue done at 2 places- Group A strep and staph aureus in wounds On vanco, cefepime and clinda- will start deescalating to cefazolin + clindamycin if  is not MRSA  Trichotillomania  Parasitophobia  Bipolar disorder  Polysubstance use- amphetamines in urine-   Discussed the management with care team

## 2021-01-19 DIAGNOSIS — L03116 Cellulitis of left lower limb: Secondary | ICD-10-CM | POA: Diagnosis not present

## 2021-01-19 LAB — CBC WITH DIFFERENTIAL/PLATELET
Abs Immature Granulocytes: 0.45 10*3/uL — ABNORMAL HIGH (ref 0.00–0.07)
Basophils Absolute: 0.1 10*3/uL (ref 0.0–0.1)
Basophils Relative: 0 %
Eosinophils Absolute: 0.1 10*3/uL (ref 0.0–0.5)
Eosinophils Relative: 0 %
HCT: 26.4 % — ABNORMAL LOW (ref 36.0–46.0)
Hemoglobin: 9.3 g/dL — ABNORMAL LOW (ref 12.0–15.0)
Immature Granulocytes: 2 %
Lymphocytes Relative: 12 %
Lymphs Abs: 2.7 10*3/uL (ref 0.7–4.0)
MCH: 30 pg (ref 26.0–34.0)
MCHC: 35.2 g/dL (ref 30.0–36.0)
MCV: 85.2 fL (ref 80.0–100.0)
Monocytes Absolute: 1.3 10*3/uL — ABNORMAL HIGH (ref 0.1–1.0)
Monocytes Relative: 6 %
Neutro Abs: 17.1 10*3/uL — ABNORMAL HIGH (ref 1.7–7.7)
Neutrophils Relative %: 80 %
Platelets: 376 10*3/uL (ref 150–400)
RBC: 3.1 MIL/uL — ABNORMAL LOW (ref 3.87–5.11)
RDW: 14.9 % (ref 11.5–15.5)
WBC: 21.5 10*3/uL — ABNORMAL HIGH (ref 4.0–10.5)
nRBC: 0 % (ref 0.0–0.2)

## 2021-01-19 LAB — BASIC METABOLIC PANEL
Anion gap: 4 — ABNORMAL LOW (ref 5–15)
BUN: 10 mg/dL (ref 8–23)
CO2: 27 mmol/L (ref 22–32)
Calcium: 7.7 mg/dL — ABNORMAL LOW (ref 8.9–10.3)
Chloride: 99 mmol/L (ref 98–111)
Creatinine, Ser: 0.47 mg/dL (ref 0.44–1.00)
GFR, Estimated: >60 mL/min (ref >60)
Glucose, Bld: 86 mg/dL (ref 70–99)
Potassium: 3.6 mmol/L (ref 3.5–5.1)
Sodium: 130 mmol/L — ABNORMAL LOW (ref 135–145)

## 2021-01-19 LAB — AEROBIC CULTURE W GRAM STAIN (SUPERFICIAL SPECIMEN): Gram Stain: NONE SEEN

## 2021-01-19 LAB — SODIUM, URINE, RANDOM: Sodium, Ur: 98 mmol/L

## 2021-01-19 LAB — OSMOLALITY, URINE: Osmolality, Ur: 479 mOsm/kg (ref 300–900)

## 2021-01-19 MED ORDER — HYDROMORPHONE HCL 1 MG/ML IJ SOLN: 0.5000 mg | INTRAMUSCULAR | Status: DC | PRN

## 2021-01-19 MED ORDER — VANCOMYCIN HCL IN DEXTROSE 1-5 GM/200ML-% IV SOLN: 1000.0000 mg | Freq: Two times a day (BID) | INTRAVENOUS | Status: DC

## 2021-01-19 MED ORDER — SODIUM CHLORIDE 0.9 % IV SOLN: INTRAVENOUS | Status: DC

## 2021-01-19 MED ORDER — VANCOMYCIN HCL 750 MG/150ML IV SOLN
750.0000 mg | Freq: Two times a day (BID) | INTRAVENOUS | Status: DC
  Filled 2021-01-19 (×2): qty 150

## 2021-01-19 MED ORDER — VANCOMYCIN HCL 750 MG/150ML IV SOLN: 750.0000 mg | Freq: Two times a day (BID) | INTRAVENOUS | Status: DC

## 2021-01-19 MED ORDER — DIAZEPAM 2 MG PO TABS
5.0000 mg | ORAL_TABLET | Freq: Three times a day (TID) | ORAL | Status: DC
  Administered 2021-01-19 (×2): 5 mg via ORAL
  Filled 2021-01-19 (×2): qty 3

## 2021-01-19 MED ORDER — POTASSIUM CHLORIDE CRYS ER 20 MEQ PO TBCR
20.0000 meq | EXTENDED_RELEASE_TABLET | Freq: Once | ORAL | Status: AC
  Administered 2021-01-19: 20 meq via ORAL
  Filled 2021-01-19: qty 1

## 2021-01-19 MED ORDER — VANCOMYCIN HCL 750 MG/150ML IV SOLN
750.0000 mg | Freq: Two times a day (BID) | INTRAVENOUS | Status: DC
  Administered 2021-01-19 (×2): 750 mg via INTRAVENOUS
  Filled 2021-01-19 (×5): qty 150

## 2021-01-19 NOTE — TOC Progression Note (Signed)
Transition of Care [facility]) - Progression Note    Patient Details  Name: Kaylee [name] MRN: [id number] Date of Birth: 06-06-1961  Transition of Care [facility]) CM/SW Contact  Bing Quarry, RN Phone Number: 01/19/2021, 5:10 PM  Clinical Narrative:  Scheduled for OR Monday 01/20/21 for ID and wound vac replacement. Gabriel Cirri RN CM           Expected Discharge Plan and Services                                                 Social Determinants of Health (SDOH) Interventions    Readmission Risk Interventions No flowsheet data found.

## 2021-01-19 NOTE — Progress Notes (Addendum)
PROGRESS NOTE    Kaylee [name]  ZOX:[id number] DOB: 06-23-1961 DOA: 01/15/2021 PCP: [name], MD   Chief Complaint  Patient presents with   Cellulitis     Brief Narrative: 60 year old female with PMH of Diabetes, Polysubstance Abuse including (Cocaine and Methamphetamine abuse related skin picking), and history of multiple MRSA skin infections presents to the ED with significant cellulitis of the LLE with small bullae, Leukocytosis up to 30-40K, and Lactic Acidosis up to 2.8 mmol/L found to have significant necrotizing fasciitis s/p 7/7 and 7/8 wound debridement.  Subjective: Fever 100.6 BP is stable. WBC down to 21K. Wounds are doing well with negative pressure wound therapy. Dr. Hazle Quant is planning to patient to OR tomorrow for debridement # 3.  Ms. Wilbon mood is stable. She is not picking at her skin or displaying any psychotic behavior. She states pain is under control with current pain regimen.  Assessment & Plan: Principal Problem:   Cellulitis of left leg Active Problems:   Chronic pain syndrome   GERD (gastroesophageal reflux disease)   Bipolar 1 disorder (HCC)   COPD (chronic obstructive pulmonary disease) (HCC)   Sepsis (HCC)   History of parasitic infection  Illicit Drug Use (opiates, benzos, amphetamine, cocaine): Intravenous Use and "skin popping" and neglect of hygiene likely predisposed patient to severe skin infection. Her mental status indicates deficits in neuropsychiatric function (including executive and motor function, mood instability, delusions of formication or infestation with bugs resulting in constant picking, and occasional non goal directed repetitive activity) which is highly suspicious of chronic daily methamphetamine abuse.   - There are no proven pharmacologic therapies to reduce cravings or manage stimulant use disorder. - 2021 NEJM trial used combination extended release Naltrexone (theoretically reduces cravings) and oral  doses of Bupropion (antidepressant effect)- this is a regimen we can try when patient is off of narcotics in combination with behavioral therapy. - HIV and Hepatitis Screen were negative.  Substance Induced Mood Disorder / Bipolar Depression: - Continue Abilify at 15 mg daily (partial agonist of dopamine and serotonin).  Increase Abilify to 30 mg after 1-2 days as tolerated for optimal response. - Continue Celexa at 20 mg daily for antidepressant and synergistic effect. - Increase Valium from 2 to 5 mg TID for anxiety and to reduce skin picking and withdrawal symptoms- monitor for oversedation while on pain medications.    Severe Necrotizing Fasciitis of the Left Lower Extremity s/p 7/7 and 7/8 Wound Debridement:  - Wounds are doing well with negative pressure wound therapy.  - Patient may benefit from hyperbaric oxygen at tertiary center - let's see how she does. - General Surgery will take patient to OR On 7/11 for debridement #3.  Appreciate assistance and recommendations. - Continue IV Vancomycin Cefazolin and Clindamycin.  Given patient's history of MRSA, I will not discontinue Vancomycin until surgical culture results finalize. - Infectious Disease is following, appreciate assistance and recommendations. - 7/7 Cultures are growing Staph, and Strep species.  Susceptibilities are pending. - 7/6 Blood Cultures are NG x 72 hours.  Significant Leukocytosis: WBC improved to 21K. - Peripheral smear showed no abnormal cytology. - 7/6 Blood Cultures show NG. - Monitor.  Lactic Acidosis, resolved:  Hypokalemia, replaced: - Monitor electrolytes.  Mild Hyponatremia: Na level is 130 mmol/L. - Hold off on any more fluids. - Check urine sodium and urine osmolality.  GERD:  - Continue IV Protonix 40 mg daily for stress prophylaxis.  Chronic COPD, not in acute exacerbation: - Inhalers  PRN  Diet Order             Diet NPO time specified  Diet effective midnight           Diet regular  Room service appropriate? Yes; Fluid consistency: Thin  Diet effective now                     DVT prophylaxis: enoxaparin (LOVENOX) injection 40 mg Start: 01/15/21 2230 Code Status:   Code Status: Full Code  Family Communication: plan of care discussed with patient at bedside.  Status is: Inpatient  Remains inpatient appropriate because:Inpatient level of care appropriate due to severity of illness  Dispo: The patient is from: Home              Anticipated d/c is to: Home              Patient currently is not medically stable to d/c.   Difficult to place patient No  Unresulted Labs (From admission, onward)     Start     Ordered   01/22/21 0500  Creatinine, serum  (enoxaparin (LOVENOX)    CrCl >/= 30 ml/min)  Weekly,   STAT     Comments: while on enoxaparin therapy    01/15/21 2229   01/20/21 1015  Vancomycin, trough  Once-Timed,   TIMED       Question:  Specimen collection method  Answer:  Lab=Lab collect   01/19/21 1015   01/20/21 0045  Vancomycin, peak  Once-Timed,   TIMED       Question:  Specimen collection method  Answer:  Lab=Lab collect   01/19/21 1015   01/17/21 0500  Basic metabolic panel  Daily,   R     Question:  Specimen collection method  Answer:  Lab=Lab collect   01/16/21 2146   01/17/21 0500  CBC with Differential/Platelet  Daily,   R     Question:  Specimen collection method  Answer:  Lab=Lab collect   01/16/21 2146             Medications reviewed:  Scheduled Meds:  ARIPiprazole  10 mg Oral Daily   Chlorhexidine Gluconate Cloth  6 each Topical Daily   citalopram  20 mg Oral Daily   diazepam  5 mg Oral Q8H   enoxaparin (LOVENOX) injection  40 mg Subcutaneous Q24H   pantoprazole (PROTONIX) IV  40 mg Intravenous QHS   Continuous Infusions:  clindamycin (CLEOCIN) IV 900 mg (01/19/21 0455)   vancomycin 750 mg (01/19/21 1051)    Consultants:see note  Procedures:see note  Antimicrobials: Anti-infectives (From admission, onward)    Start      Dose/Rate Route Frequency Ordered Stop   01/19/21 1045  vancomycin (VANCOREADY) IVPB 750 mg/150 mL        750 mg 150 mL/hr over 60 Minutes Intravenous Every 12 hours 01/19/21 0957     01/19/21 1030  vancomycin (VANCOCIN) IVPB 1000 mg/200 mL premix  Status:  Discontinued        1,000 mg 200 mL/hr over 60 Minutes Intravenous Every 12 hours 01/19/21 0943 01/19/21 0944   01/19/21 1030  vancomycin (VANCOREADY) IVPB 750 mg/150 mL  Status:  Discontinued        750 mg 150 mL/hr over 60 Minutes Intravenous Every 12 hours 01/19/21 0944 01/19/21 0949   01/19/21 1000  vancomycin (VANCOREADY) IVPB 750 mg/150 mL  Status:  Discontinued        750 mg 150 mL/hr over 60 Minutes  Intravenous Every 12 hours 01/19/21 0905 01/19/21 0907   01/19/21 0600  ceFAZolin (ANCEF) IVPB 2g/100 mL premix  Status:  Discontinued        2 g 200 mL/hr over 30 Minutes Intravenous Every 8 hours 01/18/21 1313 01/19/21 0949   01/16/21 2200  clindamycin (CLEOCIN) IVPB 900 mg        900 mg 100 mL/hr over 30 Minutes Intravenous Every 8 hours 01/16/21 1914     01/16/21 2100  vancomycin (VANCOCIN) IVPB 1000 mg/200 mL premix  Status:  Discontinued        1,000 mg 200 mL/hr over 60 Minutes Intravenous Every 24 hours 01/15/21 2246 01/16/21 0937   01/16/21 2000  metroNIDAZOLE (FLAGYL) IVPB 500 mg  Status:  Discontinued        500 mg 100 mL/hr over 60 Minutes Intravenous Every 8 hours 01/16/21 1908 01/16/21 1914   01/16/21 1030  vancomycin (VANCOREADY) IVPB 750 mg/150 mL        750 mg 150 mL/hr over 60 Minutes Intravenous 2 times daily 01/16/21 0937 01/18/21 2359   01/16/21 0300  ceFEPIme (MAXIPIME) 2 g in sodium chloride 0.9 % 100 mL IVPB        2 g 200 mL/hr over 30 Minutes Intravenous Every 8 hours 01/15/21 2242 01/18/21 2359   01/15/21 1900  vancomycin (VANCOREADY) IVPB 1500 mg/300 mL        1,500 mg 150 mL/hr over 120 Minutes Intravenous  Once 01/15/21 1825 01/15/21 2302   01/15/21 1830  vancomycin (VANCOCIN) IVPB 1000 mg/200  mL premix  Status:  Discontinued        1,000 mg 200 mL/hr over 60 Minutes Intravenous  Once 01/15/21 1822 01/15/21 1825   01/15/21 1830  cefTRIAXone (ROCEPHIN) 2 g in sodium chloride 0.9 % 100 mL IVPB        2 g 200 mL/hr over 30 Minutes Intravenous  Once 01/15/21 1822 01/15/21 1923      Culture/Microbiology    Component Value Date/Time   SDES  01/16/2021 1540    WOUND Performed at [facility], [address] [place], [place] [address]    [facility]  01/16/2021 1540    NONE Performed at [facility], [address] [place], [place] [address]    CULT  01/16/2021 1540    MODERATE METHICILLIN RESISTANT STAPHYLOCOCCUS AUREUS MODERATE STREPTOCOCCUS PYOGENES Beta hemolytic streptococci are predictably susceptible to penicillin and other beta lactams. Susceptibility testing not routinely performed. Performed at [facility], [address] [place], [place] [address]    REPTSTATUS 01/19/2021 FINAL 01/16/2021 1540    Other culture-see note  Objective: Vitals: Today's Vitals   01/19/21 0515 01/19/21 0600 01/19/21 0728 01/19/21 1114  BP:   (!) 116/59 117/66  Pulse:   90 100  Resp:   18 18  Temp:   98.9 F (37.2 C) (!) 100.6 F (38.1 C)  TempSrc:   Oral Oral  SpO2:   100% 98%  Weight:      Height:      PainSc: 10-Worst pain ever Asleep      Intake/Output Summary (Last 24 hours) at 01/19/2021 1356 Last data filed at 01/19/2021 1345 Gross per 24 hour  Intake 3698.96 ml  Output 2975 ml  Net 723.96 ml    Filed Weights   01/15/21 1600  Weight: 65.8 kg   Weight change:   Intake/Output from previous day: 07/09 0701 - 07/10 0700 In: 3219 [P.O.:240; I.V.:2029; IV Piggyback:950] Out: 2500 [Urine:1850; Drains:650]  Intake/Output this shift: Total I/O In: 720 [P.O.:720] Out: 475 [Drains:475] Filed Weights   01/15/21 1600  Weight: 65.8 kg    Examination: General exam: alert and oriented, occasionally makes paranoid statements, displays  repetitive behaviors  HEENT: dental caries Respiratory system: CTAB no WRR Cardiovascular system: Did not appreciate a murmur, regular, No JVD. Gastrointestinal system: No flank pain, Abdomen soft, NT,ND, BS+. Nervous System: No focal deficits. Extremities: No edema, distal peripheral pulses palpable.  Skin: LLE wound vac, multiple skin pocks on upper extremities MSK: stable  Data Reviewed: I have personally reviewed following labs and imaging studies CBC: Recent Labs  Lab 01/15/21 1605 01/16/21 0600 01/17/21 0701 01/18/21 0510 01/19/21 0642  WBC 26.3* 33.3* 41.8* 30.3* 21.5*  NEUTROABS 22.9*  --  37.5* 25.5* 17.1*  HGB 12.8 11.4* 10.7* 9.3* 9.3*  HCT 36.9 31.9* 31.0* 26.6* 26.4*  MCV 87.0 86.0 85.6 86.9 85.2  PLT 266 267 360 360 376    Basic Metabolic Panel: Recent Labs  Lab 01/15/21 1605 01/16/21 0027 01/16/21 0600 01/17/21 0701 01/18/21 0510 01/19/21 0642  NA 131*  --  136 135 134* 130*  K 3.0*  --  3.1* 3.7 3.5 3.6  CL 98  --  101 103 102 99  CO2 24  --  GLUCOSE 132*  --  109* 168* 155* 86  BUN 18  --  CREATININE 0.89 0.89 0.73 0.51 0.49 0.47  CALCIUM 9.0  --  8.4* 8.3* 8.1* 7.7*  MG  --   --   --  1.8  --   --   PHOS  --   --   --  3.3  --   --      Liver Function Tests: Recent Labs  Lab 01/15/21 1605  AST 17  ALT 14  ALKPHOS 114  BILITOT 0.5  PROT 6.4*  ALBUMIN 2.6*     Coagulation Profile: Recent Labs  Lab 01/15/21 1849  INR 1.0     Sepsis Labs: Recent Labs  Lab 01/15/21 2048 01/16/21 0027 01/17/21 0701 01/18/21 0510  LATICACIDVEN 2.7* 2.8* 1.7 1.4     Recent Results (from the past 240 hour(s))  Resp Panel by RT-PCR (Flu A&B, Covid) Nasopharyngeal Swab     Status: None   Collection Time: 01/15/21  6:49 PM   Specimen: Nasopharyngeal Swab; Nasopharyngeal(NP) swabs in vial transport medium  Result Value Ref Range Status   SARS Coronavirus 2 by RT PCR NEGATIVE NEGATIVE Final    Comment:  (NOTE) SARS-CoV-2 target nucleic acids are NOT DETECTED.  The SARS-CoV-2 RNA is generally detectable in upper respiratory specimens during the acute phase of infection. The lowest concentration of SARS-CoV-2 viral copies this assay can detect is 138 copies/mL. A negative result does not preclude SARS-Cov-2 infection and should not be used as the sole basis for treatment or other patient management decisions. A negative result may occur with  improper specimen collection/handling, submission of specimen other than nasopharyngeal swab, presence of viral mutation(s) within the areas targeted by this assay, and inadequate number of viral copies(<138 copies/mL). A negative result must be combined with clinical observations, patient history, and epidemiological information. The expected result is Negative.  Fact Sheet for Patients:  BloggerCourse.com  Fact Sheet for Healthcare Providers:  SeriousBroker.it  This test is no t yet approved or cleared by the [place] FDA and  has been authorized for detection and/or diagnosis of SARS-CoV-2 by FDA under an Emergency Use  Authorization (EUA). This EUA will remain  in effect (meaning this test can be used) for the duration of the COVID-19 declaration under Section 564(b)(1) of the Act, 21 U.S.C.section 360bbb-3(b)(1), unless the authorization is terminated  or revoked sooner.       Influenza A by PCR NEGATIVE NEGATIVE Final   Influenza B by PCR NEGATIVE NEGATIVE Final    Comment: (NOTE) The Xpert Xpress SARS-CoV-2/FLU/RSV plus assay is intended as an aid in the diagnosis of influenza from Nasopharyngeal swab specimens and should not be used as a sole basis for treatment. Nasal washings and aspirates are unacceptable for Xpert Xpress SARS-CoV-2/FLU/RSV testing.  Fact Sheet for Patients: BloggerCourse.com  Fact Sheet for Healthcare  Providers: SeriousBroker.it  This test is not yet approved or cleared by the [place] FDA and has been authorized for detection and/or diagnosis of SARS-CoV-2 by FDA under an Emergency Use Authorization (EUA). This EUA will remain in effect (meaning this test can be used) for the duration of the COVID-19 declaration under Section 564(b)(1) of the Act, 21 U.S.C. section 360bbb-3(b)(1), unless the authorization is terminated or revoked.  Performed at [facility], [address] [place], [place] [address]   Blood Culture (routine x 2)     Status: None (Preliminary result)   Collection Time: 01/15/21  6:49 PM   Specimen: BLOOD  Result Value Ref Range Status   Specimen Description BLOOD RIGHT ANTECUBITAL  Final   Special Requests   Final    BOTTLES DRAWN AEROBIC AND ANAEROBIC Blood Culture results may not be optimal due to an inadequate volume of blood received in culture bottles   Culture   Final    NO GROWTH 4 DAYS Performed at [facility], [address], [place], [place] [address]    Report Status PENDING  Incomplete  Blood Culture (routine x 2)     Status: None (Preliminary result)   Collection Time: 01/15/21  6:49 PM   Specimen: BLOOD  Result Value Ref Range Status   Specimen Description BLOOD BLOOD RIGHT HAND  Final   Special Requests   Final    BOTTLES DRAWN AEROBIC AND ANAEROBIC Blood Culture results may not be optimal due to an inadequate volume of blood received in culture bottles   Culture   Final    NO GROWTH 4 DAYS Performed at [facility], [address], [place], [place] [address]    Report Status PENDING  Incomplete  Urine culture     Status: Abnormal   Collection Time: 01/15/21  8:48 PM   Specimen: In/Out Cath Urine  Result Value Ref Range Status   Specimen Description   Final    IN/OUT CATH URINE Performed at [facility], [address], [place], [place] [address]    Special Requests    Final    NONE Performed at [facility], [address], [place], [place] [address]    Culture (A)  Final    <10,000 COLONIES/mL INSIGNIFICANT GROWTH Performed at [facility], [address] [place], [place] [address]    Report Status 01/17/2021 FINAL  Final  Aerobic Culture w Gram Stain (superficial specimen)     Status: None   Collection Time: 01/16/21  3:40 PM   Specimen: Wound  Result Value Ref Range Status   Specimen Description   Final    WOUND Performed at [facility], [address] [place], [place] [address]    Special Requests   Final  NONE Performed at [facility], [address] [place], [place] [address]    Gram Stain NO WBC SEEN MODERATE GRAM POSITIVE COCCI   Final   Culture   Final    MODERATE METHICILLIN RESISTANT STAPHYLOCOCCUS AUREUS MODERATE STREPTOCOCCUS PYOGENES Beta hemolytic streptococci are predictably susceptible to penicillin and other beta lactams. Susceptibility testing not routinely performed. Performed at [facility], [address] [place], [place] [address]    Report Status 01/19/2021 FINAL  Final   Organism ID, Bacteria METHICILLIN RESISTANT STAPHYLOCOCCUS AUREUS  Final      Susceptibility   Methicillin resistant staphylococcus aureus - MIC*    CIPROFLOXACIN >=8 RESISTANT Resistant     ERYTHROMYCIN >=8 RESISTANT Resistant     GENTAMICIN <=0.5 SENSITIVE Sensitive     OXACILLIN >=4 RESISTANT Resistant     TETRACYCLINE <=1 SENSITIVE Sensitive     VANCOMYCIN <=0.5 SENSITIVE Sensitive     TRIMETH/SULFA 80 RESISTANT Resistant     CLINDAMYCIN <=0.25 SENSITIVE Sensitive     RIFAMPIN <=0.5 SENSITIVE Sensitive     Inducible Clindamycin NEGATIVE Sensitive     * MODERATE METHICILLIN RESISTANT STAPHYLOCOCCUS AUREUS      Radiology Studies: No results found.   LOS: 4 days   Baldwin Jamaica, MD [facility]  01/19/2021, 1:56 PM
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
## 2021-01-19 NOTE — Progress Notes (Signed)
Patient ID: Kaylee [name], female   DOB: 04-16-1961, 60 y.o.   MRN: [id number]     SURGICAL PROGRESS NOTE   Hospital Day(s): 4.   Interval History: Patient seen and examined, no acute events or new complaints overnight. Patient reports feeling better. Pain slowly improving. Denies any issues with the negative pressure dressing.  Vital signs in last 24 hours: [min-max] current  Temp:  [97 F (36.1 C)-98.9 F (37.2 C)] 98.9 F (37.2 C) (07/10 0728) Pulse Rate:  [77-90] 90 (07/10 0728) Resp:  [15-18] 18 (07/10 0728) BP: (101-126)/(58-68) 116/59 (07/10 0728) SpO2:  [96 %-100 %] 100 % (07/10 0728)     Height: 5\' 6"  (167.6 cm) Weight: 65.8 kg BMI (Calculated): 23.41   Physical Exam:  Constitutional: alert, cooperative and no distress  Respiratory: breathing non-labored at rest  Cardiovascular: regular rate and sinus rhythm  Extremity: wounds covered with negative pressure dressing and worming adequately. No increase in ischemic skin margins.   Labs:  CBC Latest Ref Rng & Units 01/19/2021 01/18/2021 01/17/2021  WBC 4.0 - 10.5 K/uL 21.5(H) 30.3(H) 41.8(H)  Hemoglobin 12.0 - 15.0 g/dL 03/20/2021) 2.8(Z) 10.7(L)  Hematocrit 36.0 - 46.0 % 26.4(L) 26.6(L) 31.0(L)  Platelets 150 - 400 K/uL 376 360 360   CMP Latest Ref Rng & Units 01/19/2021 01/18/2021 01/17/2021  Glucose 70 - 99 mg/dL 86 03/20/2021) 947(M)  BUN 8 - 23 mg/dL 10 13 11   Creatinine 0.44 - 1.00 mg/dL 546(T 0.35  Sodium 135 - 145 mmol/L 130(L) 134(L) 135  Potassium 3.5 - 5.1 mmol/L 3.6 3.5 3.7  Chloride 98 - 111 mmol/L 99 102 103  CO2 22 - 32 mmol/L 27 28 26   Calcium 8.9 - 10.3 mg/dL 7.7(L) 8.1(L) 8.3(L)  Total Protein 6.5 - 8.1 g/dL - - -  Total Bilirubin 0.3 - 1.2 mg/dL - - -  Alkaline Phos 38 - 126 U/L - - -  AST 15 - 41 U/L - - -  ALT 0 - 44 U/L - - -    Imaging studies: No new pertinent imaging studies   Assessment/Plan:  60 y.o. female with necrotizing fasciitis of the left lower extremity 3 Day Post-Op s/p extensive cleansing  and debridement, complicated by pertinent comorbidities including COPD, bipolar type I.  Patient responding well to IV abx therapy and debridement with negative pressure dressing. Will continue with plan of taking her to OR tomorrow for possible further debridement is any new ischemic/infected tissue and change of negative pressure dressing.   6.81, MD

## 2021-01-19 NOTE — H&P (View-Only) (Signed)
Patient ID: Kaylee [name], female   DOB: 05/23/1961, 60 y.o.   MRN: [id number]     SURGICAL PROGRESS NOTE   Hospital Day(s): 4.   Interval History: Patient seen and examined, no acute events or new complaints overnight. Patient reports feeling better. Pain slowly improving. Denies any issues with the negative pressure dressing.  Vital signs in last 24 hours: [min-max] current  Temp:  [97 F (36.1 C)-98.9 F (37.2 C)] 98.9 F (37.2 C) (07/10 0728) Pulse Rate:  [77-90] 90 (07/10 0728) Resp:  [15-18] 18 (07/10 0728) BP: (101-126)/(58-68) 116/59 (07/10 0728) SpO2:  [96 %-100 %] 100 % (07/10 0728)     Height: 5' 6" (167.6 cm) Weight: 65.8 kg BMI (Calculated): 23.41   Physical Exam:  Constitutional: alert, cooperative and no distress  Respiratory: breathing non-labored at rest  Cardiovascular: regular rate and sinus rhythm  Extremity: wounds covered with negative pressure dressing and worming adequately. No increase in ischemic skin margins.   Labs:  CBC Latest Ref Rng & Units 01/19/2021 01/18/2021 01/17/2021  WBC 4.0 - 10.5 K/uL 21.5(H) 30.3(H) 41.8(H)  Hemoglobin 12.0 - 15.0 g/dL 9.3(L) 9.3(L) 10.7(L)  Hematocrit 36.0 - 46.0 % 26.4(L) 26.6(L) 31.0(L)  Platelets 150 - 400 K/uL 376 360 360   CMP Latest Ref Rng & Units 01/19/2021 01/18/2021 01/17/2021  Glucose 70 - 99 mg/dL 86 155(H) 168(H)  BUN 8 - 23 mg/dL 10 13 11  Creatinine 0.44 - 1.00 mg/dL 0.47 0.49 0.51  Sodium 135 - 145 mmol/L 130(L) 134(L) 135  Potassium 3.5 - 5.1 mmol/L 3.6 3.5 3.7  Chloride 98 - 111 mmol/L 99 102 103  CO2 22 - 32 mmol/L 27 28 26  Calcium 8.9 - 10.3 mg/dL 7.7(L) 8.1(L) 8.3(L)  Total Protein 6.5 - 8.1 g/dL - - -  Total Bilirubin 0.3 - 1.2 mg/dL - - -  Alkaline Phos 38 - 126 U/L - - -  AST 15 - 41 U/L - - -  ALT 0 - 44 U/L - - -    Imaging studies: No new pertinent imaging studies   Assessment/Plan:  60 y.o. female with necrotizing fasciitis of the left lower extremity 3 Day Post-Op s/p extensive cleansing  and debridement, complicated by pertinent comorbidities including COPD, bipolar type I.  Patient responding well to IV abx therapy and debridement with negative pressure dressing. Will continue with plan of taking her to OR tomorrow for possible further debridement is any new ischemic/infected tissue and change of negative pressure dressing.   Kaylee [name] Cintrn-Daz, MD    

## 2021-01-20 ENCOUNTER — Encounter
Admission: EM | Disposition: A | Discharge status: Home / Self Care | Payer: Self-pay | Source: Home / Self Care | Attending: Internal Medicine

## 2021-01-20 ENCOUNTER — Inpatient Hospital Stay: Payer: Medicare Other | Admitting: Anesthesiology

## 2021-01-20 DIAGNOSIS — M726 Necrotizing fasciitis: Secondary | ICD-10-CM | POA: Diagnosis not present

## 2021-01-20 DIAGNOSIS — L03116 Cellulitis of left lower limb: Secondary | ICD-10-CM | POA: Diagnosis not present

## 2021-01-20 HISTORY — PX: APPLICATION OF WOUND VAC: SHX5189

## 2021-01-20 HISTORY — PX: INCISION AND DRAINAGE ABSCESS: SHX5864

## 2021-01-20 LAB — CULTURE, BLOOD (ROUTINE X 2)
Culture: NO GROWTH
Culture: NO GROWTH

## 2021-01-20 LAB — CBC WITH DIFFERENTIAL/PLATELET
Abs Immature Granulocytes: 0.95 10*3/uL — ABNORMAL HIGH (ref 0.00–0.07)
Basophils Absolute: 0.1 10*3/uL (ref 0.0–0.1)
Basophils Relative: 0 %
Eosinophils Absolute: 0.1 10*3/uL (ref 0.0–0.5)
Eosinophils Relative: 0 %
HCT: 26.6 % — ABNORMAL LOW (ref 36.0–46.0)
Hemoglobin: 9.2 g/dL — ABNORMAL LOW (ref 12.0–15.0)
Immature Granulocytes: 4 %
Lymphocytes Relative: 11 %
Lymphs Abs: 2.9 10*3/uL (ref 0.7–4.0)
MCH: 30.2 pg (ref 26.0–34.0)
MCHC: 34.6 g/dL (ref 30.0–36.0)
MCV: 87.2 fL (ref 80.0–100.0)
Monocytes Absolute: 1.2 10*3/uL — ABNORMAL HIGH (ref 0.1–1.0)
Monocytes Relative: 4 %
Neutro Abs: 21.6 10*3/uL — ABNORMAL HIGH (ref 1.7–7.7)
Neutrophils Relative %: 81 %
Platelets: 377 10*3/uL (ref 150–400)
RBC: 3.05 MIL/uL — ABNORMAL LOW (ref 3.87–5.11)
RDW: 15.1 % (ref 11.5–15.5)
Smear Review: NORMAL
WBC: 26.8 10*3/uL — ABNORMAL HIGH (ref 4.0–10.5)
nRBC: 0 % (ref 0.0–0.2)

## 2021-01-20 LAB — BASIC METABOLIC PANEL
Anion gap: 5 (ref 5–15)
BUN: 9 mg/dL (ref 8–23)
CO2: 28 mmol/L (ref 22–32)
Calcium: 7.9 mg/dL — ABNORMAL LOW (ref 8.9–10.3)
Chloride: 99 mmol/L (ref 98–111)
Creatinine, Ser: 0.43 mg/dL — ABNORMAL LOW (ref 0.44–1.00)
GFR, Estimated: >60 mL/min (ref >60)
Glucose, Bld: 88 mg/dL (ref 70–99)
Potassium: 4 mmol/L (ref 3.5–5.1)
Sodium: 132 mmol/L — ABNORMAL LOW (ref 135–145)

## 2021-01-20 LAB — GLUCOSE, CAPILLARY: Glucose-Capillary: 170 mg/dL — ABNORMAL HIGH (ref 70–99)

## 2021-01-20 LAB — PHOSPHORUS: Phosphorus: 3.4 mg/dL (ref 2.5–4.6)

## 2021-01-20 LAB — MAGNESIUM: Magnesium: 1.8 mg/dL (ref 1.7–2.4)

## 2021-01-20 LAB — SURGICAL PATHOLOGY

## 2021-01-20 LAB — VANCOMYCIN, PEAK: Vancomycin Pk: 19 ug/mL — ABNORMAL LOW (ref 30–40)

## 2021-01-20 SURGERY — INCISION AND DRAINAGE, ABSCESS
Anesthesia: General | Laterality: Left

## 2021-01-20 MED ORDER — LINACLOTIDE 145 MCG PO CAPS
290.0000 ug | ORAL_CAPSULE | Freq: Every day | ORAL | Status: DC
  Administered 2021-01-21 – 2021-02-04 (×14): 290 ug via ORAL
  Filled 2021-01-20 (×2): qty 1
  Filled 2021-01-20: qty 2
  Filled 2021-01-20 (×5): qty 1
  Filled 2021-01-20: qty 2
  Filled 2021-01-20 (×4): qty 1
  Filled 2021-01-20: qty 2
  Filled 2021-01-20 (×2): qty 1

## 2021-01-20 MED ORDER — INSULIN ASPART 100 UNIT/ML IJ SOLN
0.0000 [IU] | Freq: Three times a day (TID) | INTRAMUSCULAR | Status: DC
Start: 2021-01-21 — End: 2021-01-24
  Filled 2021-01-20: qty 1

## 2021-01-20 MED ORDER — FENTANYL CITRATE (PF) 100 MCG/2ML IJ SOLN
INTRAMUSCULAR | Status: AC
  Filled 2021-01-20: qty 2

## 2021-01-20 MED ORDER — VANCOMYCIN HCL 750 MG/150ML IV SOLN
750.0000 mg | Freq: Two times a day (BID) | INTRAVENOUS | Status: DC
  Filled 2021-01-20 (×2): qty 150

## 2021-01-20 MED ORDER — SODIUM CHLORIDE 0.9 % IV SOLN
2.0000 g | Freq: Three times a day (TID) | INTRAVENOUS | Status: DC
  Administered 2021-01-20: 2 g via INTRAVENOUS
  Filled 2021-01-20 (×5): qty 2

## 2021-01-20 MED ORDER — FENTANYL CITRATE (PF) 100 MCG/2ML IJ SOLN
INTRAMUSCULAR | Status: AC
  Administered 2021-01-20: 50 ug via INTRAVENOUS
  Filled 2021-01-20: qty 2

## 2021-01-20 MED ORDER — HYDROCODONE-ACETAMINOPHEN 5-325 MG PO TABS
2.0000 | ORAL_TABLET | ORAL | Status: DC | PRN
  Administered 2021-01-21 – 2021-02-02 (×50): 2 via ORAL
  Filled 2021-01-20 (×51): qty 2

## 2021-01-20 MED ORDER — CHLORHEXIDINE GLUCONATE CLOTH 2 % EX PADS
6.0000 | MEDICATED_PAD | Freq: Every day | CUTANEOUS | Status: DC
  Administered 2021-01-20 – 2021-02-04 (×14): 6 via TOPICAL

## 2021-01-20 MED ORDER — POLYETHYLENE GLYCOL 3350 17 G PO PACK
17.0000 g | PACK | Freq: Every day | ORAL | Status: DC
  Administered 2021-01-20 – 2021-02-04 (×13): 17 g via ORAL
  Filled 2021-01-20 (×14): qty 1

## 2021-01-20 MED ORDER — LACTATED RINGERS IV SOLN: INTRAVENOUS | Status: DC | PRN

## 2021-01-20 MED ORDER — MEPERIDINE HCL 25 MG/ML IJ SOLN: 6.2500 mg | INTRAMUSCULAR | Status: DC | PRN

## 2021-01-20 MED ORDER — ENOXAPARIN SODIUM 40 MG/0.4ML IJ SOSY
40.0000 mg | PREFILLED_SYRINGE | INTRAMUSCULAR | Status: DC
  Administered 2021-01-20 – 2021-02-03 (×14): 40 mg via SUBCUTANEOUS
  Filled 2021-01-20 (×14): qty 0.4

## 2021-01-20 MED ORDER — DIAZEPAM 2 MG PO TABS
5.0000 mg | ORAL_TABLET | Freq: Four times a day (QID) | ORAL | Status: DC
  Administered 2021-01-20 – 2021-01-21 (×3): 5 mg via ORAL
  Filled 2021-01-20 (×5): qty 3

## 2021-01-20 MED ORDER — SEVOFLURANE IN SOLN
RESPIRATORY_TRACT | Status: AC
  Filled 2021-01-20: qty 250

## 2021-01-20 MED ORDER — PHENYLEPHRINE HCL (PRESSORS) 10 MG/ML IV SOLN
INTRAVENOUS | Status: AC
  Filled 2021-01-20: qty 1

## 2021-01-20 MED ORDER — MIDAZOLAM HCL 2 MG/2ML IJ SOLN
INTRAMUSCULAR | Status: DC | PRN
  Administered 2021-01-20: 2 mg via INTRAVENOUS

## 2021-01-20 MED ORDER — LACTATED RINGERS IV SOLN: INTRAVENOUS | Status: DC

## 2021-01-20 MED ORDER — VANCOMYCIN HCL 1000 MG IV SOLR
INTRAVENOUS | Status: DC | PRN
  Administered 2021-01-20: 1000 mg via INTRAVENOUS

## 2021-01-20 MED ORDER — ARIPIPRAZOLE 2 MG PO TABS
5.0000 mg | ORAL_TABLET | Freq: Every day | ORAL | Status: DC
  Administered 2021-01-21 – 2021-02-04 (×15): 5 mg via ORAL
  Filled 2021-01-20 (×15): qty 3

## 2021-01-20 MED ORDER — LINEZOLID 600 MG/300ML IV SOLN
600.0000 mg | Freq: Two times a day (BID) | INTRAVENOUS | Status: DC
  Administered 2021-01-20 – 2021-01-27 (×13): 600 mg via INTRAVENOUS
  Filled 2021-01-20 (×16): qty 300

## 2021-01-20 MED ORDER — LINEZOLID 600 MG/300ML IV SOLN
600.0000 mg | Freq: Two times a day (BID) | INTRAVENOUS | Status: DC
  Filled 2021-01-20 (×2): qty 300

## 2021-01-20 MED ORDER — FENTANYL CITRATE (PF) 100 MCG/2ML IJ SOLN
25.0000 ug | INTRAMUSCULAR | Status: DC | PRN
  Administered 2021-01-20 (×2): 50 ug via INTRAVENOUS

## 2021-01-20 MED ORDER — LUBIPROSTONE 24 MCG PO CAPS
24.0000 ug | ORAL_CAPSULE | Freq: Two times a day (BID) | ORAL | Status: AC
  Administered 2021-01-20 – 2021-01-21 (×2): 24 ug via ORAL
  Filled 2021-01-20 (×3): qty 1

## 2021-01-20 MED ORDER — HYDROMORPHONE HCL 1 MG/ML IJ SOLN
0.5000 mg | INTRAMUSCULAR | Status: DC | PRN
  Administered 2021-01-23 – 2021-01-28 (×10): 0.5 mg via INTRAVENOUS
  Filled 2021-01-20 (×11): qty 1

## 2021-01-20 MED ORDER — ONDANSETRON HCL 4 MG/2ML IJ SOLN
4.0000 mg | Freq: Once | INTRAMUSCULAR | Status: DC | PRN
Start: 2021-01-20 — End: 2021-01-20

## 2021-01-20 MED ORDER — SENNOSIDES-DOCUSATE SODIUM 8.6-50 MG PO TABS
2.0000 | ORAL_TABLET | Freq: Every day | ORAL | Status: DC
  Administered 2021-01-20 – 2021-02-04 (×13): 2 via ORAL
  Filled 2021-01-20 (×14): qty 2

## 2021-01-20 MED ORDER — INSULIN ASPART 100 UNIT/ML IJ SOLN
0.0000 [IU] | Freq: Every day | INTRAMUSCULAR | Status: DC
Start: 2021-01-21 — End: 2021-01-24

## 2021-01-20 MED ORDER — MIDAZOLAM HCL 2 MG/2ML IJ SOLN
INTRAMUSCULAR | Status: AC
  Filled 2021-01-20: qty 2

## 2021-01-20 MED ORDER — CLINDAMYCIN PHOSPHATE 900 MG/50ML IV SOLN
900.0000 mg | Freq: Three times a day (TID) | INTRAVENOUS | Status: DC
  Filled 2021-01-20 (×4): qty 50

## 2021-01-20 MED ORDER — 0.9 % SODIUM CHLORIDE (POUR BTL) OPTIME
TOPICAL | Status: DC | PRN
  Administered 2021-01-20: 500 mL

## 2021-01-20 MED ORDER — PROPOFOL 10 MG/ML IV BOLUS
INTRAVENOUS | Status: AC
  Filled 2021-01-20: qty 20

## 2021-01-20 MED ORDER — LIDOCAINE HCL (CARDIAC) PF 100 MG/5ML IV SOSY
PREFILLED_SYRINGE | INTRAVENOUS | Status: DC | PRN
  Administered 2021-01-20: 100 mg via INTRAVENOUS

## 2021-01-20 MED ORDER — FENTANYL CITRATE (PF) 100 MCG/2ML IJ SOLN
INTRAMUSCULAR | Status: DC | PRN
  Administered 2021-01-20 (×4): 50 ug via INTRAVENOUS

## 2021-01-20 MED ORDER — PROPOFOL 10 MG/ML IV BOLUS
INTRAVENOUS | Status: DC | PRN
  Administered 2021-01-20: 150 mg via INTRAVENOUS

## 2021-01-20 MED ORDER — SODIUM CHLORIDE 0.9 % IR SOLN
Status: DC | PRN
  Administered 2021-01-20 (×2): 1000 mL

## 2021-01-20 SURGICAL SUPPLY — 35 items
BACTOSHIELD CHG 4% 4OZ (MISCELLANEOUS) ×2
BLADE CLIPPER SURG (BLADE) ×2 IMPLANT
BLADE SURG 15 STRL LF DISP TIS (BLADE) ×1 IMPLANT
BLADE SURG 15 STRL SS (BLADE) ×2
BRUSH SCRUB EZ  4% CHG (MISCELLANEOUS) ×8
BRUSH SCRUB EZ 4% CHG (MISCELLANEOUS) ×1 IMPLANT
CANISTER SUCT 1200ML W/VALVE (MISCELLANEOUS) ×2 IMPLANT
CANISTER WOUND CARE 500ML ATS (WOUND CARE) ×3 IMPLANT
DRAPE CHEST BREAST 77X106 FENE (MISCELLANEOUS) IMPLANT
DRAPE DERMATAC (DRAPES) ×4 IMPLANT
DRAPE LAPAROTOMY 100X77 ABD (DRAPES) ×1 IMPLANT
DRAPE LAPAROTOMY 77X122 PED (DRAPES) ×1 IMPLANT
DRAPE ORTHO SPLIT 77X108 STRL (DRAPES) ×4
DRAPE SURG ORHT 6 SPLT 77X108 (DRAPES) IMPLANT
DRESSING VERAFLO CLEANSE CC (GAUZE/BANDAGES/DRESSINGS) IMPLANT
DRSG VAC ATS MED SENSATRAC (GAUZE/BANDAGES/DRESSINGS) ×2 IMPLANT
DRSG VERAFLO CLEANSE CC (GAUZE/BANDAGES/DRESSINGS) ×4 IMPLANT
ELECT REM PT RETURN 9FT ADLT (ELECTROSURGICAL) ×2 IMPLANT
ELECTRODE REM PT RTRN 9FT ADLT (ELECTROSURGICAL) ×1 IMPLANT
GAUZE 4X4 16PLY ~~LOC~~+RFID DBL (SPONGE) ×2 IMPLANT
GAUZE SPONGE 4X4 12PLY STRL (GAUZE/BANDAGES/DRESSINGS) ×1 IMPLANT
GLOVE SURG ENC MOIS LTX SZ6.5 (GLOVE) ×2 IMPLANT
GLOVE SURG UNDER POLY LF SZ6.5 (GLOVE) ×2 IMPLANT
GOWN STRL REUS W/ TWL LRG LVL3 (GOWN DISPOSABLE) ×2 IMPLANT
GOWN STRL REUS W/TWL LRG LVL3 (GOWN DISPOSABLE) ×4
KIT TURNOVER KIT A (KITS) ×2 IMPLANT
MANIFOLD NEPTUNE II (INSTRUMENTS) ×2 IMPLANT
NEEDLE HYPO 22GX1.5 SAFETY (NEEDLE) ×2 IMPLANT
NS IRRIG 1000ML POUR BTL (IV SOLUTION) ×2 IMPLANT
PACK BASIN MINOR ARMC (MISCELLANEOUS) ×2 IMPLANT
SCRUB CHG 4% DYNA-HEX 4OZ (MISCELLANEOUS) IMPLANT
SOL PREP PVP 2OZ (MISCELLANEOUS) IMPLANT
SOLUTION PREP PVP 2OZ (MISCELLANEOUS) ×2 IMPLANT
SPONGE T-LAP 18X18 ~~LOC~~+RFID (SPONGE) ×3 IMPLANT
SUT ETHILON 2 0 FS 18 (SUTURE) ×1 IMPLANT

## 2021-01-20 NOTE — Progress Notes (Signed)
ID Pt seen in [facility] Had wound vac change under anesthesia Stable No specific complaints  Patient Vitals for the past 24 hrs:  BP Temp Temp src Pulse Resp SpO2 Height Weight  01/20/21 1411 117/60 98.1 F (36.7 C) Oral 87 16 100 % -- --  01/20/21 1345 (!) 124/55 98.3 F (36.8 C) -- 92 17 98 % -- --  01/20/21 1330 125/74 -- -- 91 14 100 % -- --  01/20/21 1315 117/70 -- -- 84 16 100 % -- --  01/20/21 1304 (!) 116/54 98.9 F (37.2 C) -- 83 15 100 % -- --  01/20/21 0953 120/62 100.2 F (37.9 C) Temporal 94 20 97 % 5\' 6"  (1.676 m) 65.8 kg  01/20/21 0731 116/63 98.6 F (37 C) -- 89 16 97 % -- --  01/20/21 0437 119/61 98.4 F (36.9 C) -- 87 19 99 % -- --  01/19/21 2013 117/62 99.6 F (37.6 C) -- 95 18 97 % -- --  Awake Chest CTA [id number] Abd soft Left leg Has wound vac Reviewed pictures taken by surgeon      Discussed with Dr.Cintron- wound clean- some superficial necrosis in the margin   Labs CBC Latest Ref Rng & Units 01/20/2021 01/19/2021 01/18/2021  WBC 4.0 - 10.5 K/uL 26.8(H) 21.5(H) 30.3(H)  Hemoglobin 12.0 - 15.0 g/dL 03/21/2021) 8.0(D) 9.8(P)  Hematocrit 36.0 - 46.0 % 26.6(L) 26.4(L) 26.6(L)  Platelets 150 - 400 K/uL 377 376 360    CMP Latest Ref Rng & Units 01/20/2021 01/19/2021 01/18/2021  Glucose 70 - 99 mg/dL 88 86 03/21/2021)  BUN 8 - 23 mg/dL 9 10 13   Creatinine 0.44 - 1.00 mg/dL 505(L) 9.76(B  Sodium 135 - 145 mmol/L 132(L) 130(L) 134(L)  Potassium 3.5 - 5.1 mmol/L 4.0 3.6 3.5  Chloride 98 - 111 mmol/L 99 99 102  CO2 22 - 32 mmol/L 28 27 28   Calcium 8.9 - 10.3 mg/dL 7.9(L) 7.7(L) 8.1(L)  Total Protein 6.5 - 8.1 g/dL - - -  Total Bilirubin 0.3 - 1.2 mg/dL - - -  Alkaline Phos 38 - 126 U/L - - -  AST 15 - 41 U/L - - -  ALT 0 - 44 U/L - - -     Micro- Wound culture- MRSA  and Group A strepto coccus- which is strep Pyogenes  Impression/recommendation  Pt with necrotizing fascitis /soft tissue infection of the left leg- cultures are positive for MRSA and Group A  streptococcus- She was on vanco/cefepime/clinadmycin and cefepime was stopped on Saturday . Was added cefepime today because of WBC increasing to 26 from 21.  Cefepime is not needed as there is no gram neg organisms . Linezolid would be a better antibiotic than Vanco+ clinda as it is an  excellent Tissue penetration, covers MRSA and GAS and also has antitoxin effect- After discussion with Dr.Masoud we plan to stop celexa, continue abilify 5 mg and keep valium 5 mg Q 8 . Fentanyl will be stopped- will watch her closely for serotonin syndrome which is less likely to happen on benzo Discussed the management with the care team

## 2021-01-20 NOTE — Anesthesia Postprocedure Evaluation (Signed)
Anesthesia Post Note  Patient: Kaylee [name]  Procedure(s) Performed: INCISION AND DRAINAGE ABSCESS (Left) APPLICATION OF WOUND VAC (Left)  Patient location during evaluation: PACU Anesthesia Type: General Level of consciousness: awake and alert, awake and oriented Pain management: pain level controlled Vital Signs Assessment: post-procedure vital signs reviewed and stable Respiratory status: spontaneous breathing, nonlabored ventilation and respiratory function stable Cardiovascular status: blood pressure returned to baseline and stable Postop Assessment: no apparent nausea or vomiting Anesthetic complications: no   No notable events documented.   Last Vitals:  Vitals:   01/20/21 1345 01/20/21 1411  BP: (!) 124/55 117/60  Pulse: 92 87  Resp: 17 16  Temp: 36.8 C 36.7 C  SpO2: 98% 100%    Last Pain:  Vitals:   01/20/21 1411  TempSrc: Oral  PainSc:                  Manfred Arch

## 2021-01-20 NOTE — Progress Notes (Addendum)
PROGRESS NOTE    Kaylee [name]  WUJ:[id number] DOB: 1960/09/23 DOA: 01/15/2021 PCP: [name], MD   Chief Complaint  Patient presents with   Cellulitis     Brief Narrative: 60 year old female with PMH of Diabetes, Polysubstance Abuse including (Cocaine and Methamphetamine abuse related skin picking), and history of multiple MRSA skin infections presents to the ED with significant cellulitis of the LLE with diffuse erythema large necrotic blisters and tense skin, Leukocytosis up to 30-40K, and Lactic Acidosis up to 2.8 mmol/L found to have significant necrotizing fasciitis s/p 7/7, 7/8, and 7/11 wound debridement.  Subjective: Has low grade fevers. BP is stable. WBC is up to 26K with Bands.  Patient is in the OR for debridement # 3.    [name] is tired after the procedure. Pain is under control with current regimen.  Since starting her psychiatric meds - her mood has been stable. She has not been picking at her skin or displaying any psychotic behavior.  Assessment & Plan: Principal Problem:   Cellulitis of left leg Active Problems:   Chronic pain syndrome   GERD (gastroesophageal reflux disease)   Bipolar 1 disorder (HCC)   COPD (chronic obstructive pulmonary disease) (HCC)   Sepsis (HCC)   History of parasitic infection  Illicit Drug Use (opiates, benzos, amphetamine, cocaine): Intravenous Use and "skin popping" and neglect of hygiene likely predisposed patient to severe skin infection. Her mental status indicates deficits in neuropsychiatric function (including executive and motor function, mood instability, delusions of formication or infestation with bugs resulting in constant picking, and occasional non goal directed repetitive activity) which is highly suspicious of chronic daily methamphetamine abuse.   - There are no proven pharmacologic therapies to reduce cravings or manage stimulant use disorder. - 2021 NEJM trial used combination extended release  Naltrexone (theoretically reduces cravings) and oral doses of Bupropion (antidepressant effect)- this is a regimen we can try when patient is off of narcotics in combination with behavioral therapy. - HIV and Hepatitis Screen were negative.  Substance Induced Mood Disorder / Bipolar Depression: - Decrease Abilify to 5 mg daily while on Linezolid.  - Discontinue Celexa while on Linezolid. - Both of these medications need to be up-titrated after treatment of infection to control patient's acute psychosis, hallucinations, paranoia, skin picking, and substance withdrawal symptoms. - Increase Valium to 5 mg q6h while on Linezolid to prevent serotonin syndrome. Continue benzos throughout hospitalization - this does not over sedate her as she has chronic dependence.  Severe Necrotizing Fasciitis of the Left Lower Extremity s/p 7/7, 7/8, 7/11 Wound Debridement:  - Continue negative pressure wound therapy.  - General Surgery took patient to OR On 7/11 for debridement #3 - there was no purulent drainage - there was some residual necrotic tissue at the dermis.  Appreciate assistance. - Patient has been on IV Vancomycin Cefepime and Clindamycin.  7/7 Surgical Cultures are growing MRSA and Strep pyogenes.  Patient has a history of multiple MRSA infections. - I spoke with Infectious Disease - we will change antibiotics to IV Linezolid 600 q12 hrs as wounds and WBC count are not improving on standard broad spectrum antibiotics (Linezolid appears to be superior to Vanc per Cochrane Review).  This has shown to provide coverage against strep pyogenes and toxin inhibition in vitro.   - Appreciate Infectious Disease assistance and recommendations. - If patient develops any signs of organ failure or becomes critically ill on pressors, she may have type 2 nec fasc and we  will consider 3 days of IVIG (Darenberg et al 2004). - If type 1 nec fasc - some of these forms are caused by anaerobic bacteria and oxygen is toxic to  these species.  Hyperbaric oxygen therapy is not supported by high level evidence and she would not qualify for a transfer simply for this.  If patient gets clinically worse, we may consider using high flow nasal cannula with FiO2 100% or nonrebreather mask at 15L/[place]. - Maintain isolation precautions until patient clinically improves.  Pain Regimen: - Discontinue Fentanyl to avoid interaction with Linezolid. - Give Oxycodone 10 mg q4hrs PRN and IV Dilaudid 0.5 mg q2hrs PRN. - Start bowel regimen Senna BID and Miralax daily.   - Give Amitiza 24 mcg daily x 2 doses. - Start Linzess tomorrow per patient's request. - Monitor bowel movements.  Significant Leukocytosis: WBC up to 26K with bandemia. - Peripheral smear showed no abnormal cytology. - 7/6 Blood Cultures show NG. - Monitor.  Lactic Acidosis, resolved:  Hypokalemia, replaced: - Monitor electrolytes.  Mild Hyponatremia: Na level stable around 132 mmol/L - urine studies consistent with mild overload. - Continue gentle fluids.  GERD:  - Continue IV Protonix 40 mg daily for stress prophylaxis.  Chronic COPD, not in acute exacerbation: - Inhalers PRN  Diet Order             Diet regular Room service appropriate? Yes; Fluid consistency: Thin  Diet effective now                     DVT prophylaxis: Lovenox 40 mg daily Code Status:   Code Status: Full Code  Family Communication: Plan of Care discussed with daughter over the phone.  Status is: Inpatient  Remains inpatient appropriate because:Inpatient level of care appropriate due to severity of illness  Dispo: The patient is from: Home              Anticipated d/c is to: Likely need SNF.              Patient currently is not medically stable to d/c.   Difficult to place patient No  Unresulted Labs (From admission, onward)     Start     Ordered   01/22/21 0500  Creatinine, serum  (enoxaparin (LOVENOX)    CrCl >/= 30 ml/min)  Weekly,   STAT     Comments: while on  enoxaparin therapy    01/15/21 2229   01/20/21 2300  Vancomycin, trough  Once-Timed,   TIMED       Question:  Specimen collection method  Answer:  Lab=Lab collect   01/20/21 1132   01/17/21 0500  Basic metabolic panel  Daily,   R     Question:  Specimen collection method  Answer:  Lab=Lab collect   01/16/21 2146   01/17/21 0500  CBC with Differential/Platelet  Daily,   R     Question:  Specimen collection method  Answer:  Lab=Lab collect   01/16/21 2146             Medications reviewed:  Scheduled Meds:  ARIPiprazole  10 mg Oral Daily   Chlorhexidine Gluconate Cloth  6 each Topical Daily   Chlorhexidine Gluconate Cloth  6 each Topical Q0600   citalopram  20 mg Oral Daily   diazepam  5 mg Oral Q8H   fentaNYL       pantoprazole (PROTONIX) IV  40 mg Intravenous QHS   Continuous Infusions:  ceFEPime (MAXIPIME) IV  clindamycin (CLEOCIN) IV 900 mg (01/20/21 0602)   lactated ringers 75 mL/hr at 01/20/21 1004   vancomycin 150 mL/hr at 01/20/21 1113    Consultants:see note  Procedures:see note  Antimicrobials: Anti-infectives (From admission, onward)    Start     Dose/Rate Route Frequency Ordered Stop   01/20/21 0930  ceFEPIme (MAXIPIME) 2 g in sodium chloride 0.9 % 100 mL IVPB        2 g 200 mL/hr over 30 Minutes Intravenous Every 8 hours 01/20/21 0838     01/19/21 1045  vancomycin (VANCOREADY) IVPB 750 mg/150 mL        750 mg 150 mL/hr over 60 Minutes Intravenous Every 12 hours 01/19/21 0957     01/19/21 1030  vancomycin (VANCOCIN) IVPB 1000 mg/200 mL premix  Status:  Discontinued        1,000 mg 200 mL/hr over 60 Minutes Intravenous Every 12 hours 01/19/21 0943 01/19/21 0944   01/19/21 1030  vancomycin (VANCOREADY) IVPB 750 mg/150 mL  Status:  Discontinued        750 mg 150 mL/hr over 60 Minutes Intravenous Every 12 hours 01/19/21 0944 01/19/21 0949   01/19/21 1000  vancomycin (VANCOREADY) IVPB 750 mg/150 mL  Status:  Discontinued        750 mg 150 mL/hr over 60  Minutes Intravenous Every 12 hours 01/19/21 0905 01/19/21 0907   01/19/21 0600  ceFAZolin (ANCEF) IVPB 2g/100 mL premix  Status:  Discontinued        2 g 200 mL/hr over 30 Minutes Intravenous Every 8 hours 01/18/21 1313 01/19/21 0949   01/16/21 2200  clindamycin (CLEOCIN) IVPB 900 mg        900 mg 100 mL/hr over 30 Minutes Intravenous Every 8 hours 01/16/21 1914     01/16/21 2100  vancomycin (VANCOCIN) IVPB 1000 mg/200 mL premix  Status:  Discontinued        1,000 mg 200 mL/hr over 60 Minutes Intravenous Every 24 hours 01/15/21 2246 01/16/21 0937   01/16/21 2000  metroNIDAZOLE (FLAGYL) IVPB 500 mg  Status:  Discontinued        500 mg 100 mL/hr over 60 Minutes Intravenous Every 8 hours 01/16/21 1908 01/16/21 1914   01/16/21 1030  vancomycin (VANCOREADY) IVPB 750 mg/150 mL        750 mg 150 mL/hr over 60 Minutes Intravenous 2 times daily 01/16/21 0937 01/18/21 2359   01/16/21 0300  ceFEPIme (MAXIPIME) 2 g in sodium chloride 0.9 % 100 mL IVPB        2 g 200 mL/hr over 30 Minutes Intravenous Every 8 hours 01/15/21 2242 01/18/21 2359   01/15/21 1900  vancomycin (VANCOREADY) IVPB 1500 mg/300 mL        1,500 mg 150 mL/hr over 120 Minutes Intravenous  Once 01/15/21 1825 01/15/21 2302   01/15/21 1830  vancomycin (VANCOCIN) IVPB 1000 mg/200 mL premix  Status:  Discontinued        1,000 mg 200 mL/hr over 60 Minutes Intravenous  Once 01/15/21 1822 01/15/21 1825   01/15/21 1830  cefTRIAXone (ROCEPHIN) 2 g in sodium chloride 0.9 % 100 mL IVPB        2 g 200 mL/hr over 30 Minutes Intravenous  Once 01/15/21 1822 01/15/21 1923      Culture/Microbiology    Component Value Date/Time   SDES  01/16/2021 1540    WOUND Performed at [facility], [address] [place], [place] [address]    [facility]  01/16/2021 1540  NONE Performed at [facility], [address] [place], [place] [address]    CULT  01/16/2021 1540    MODERATE METHICILLIN RESISTANT STAPHYLOCOCCUS  AUREUS MODERATE STREPTOCOCCUS PYOGENES Beta hemolytic streptococci are predictably susceptible to penicillin and other beta lactams. Susceptibility testing not routinely performed. Performed at [facility], [address] [place], [place] [address]    REPTSTATUS 01/19/2021 FINAL 01/16/2021 1540    Other culture-see note  Objective: Vitals: Today's Vitals   01/20/21 1315 01/20/21 1330 01/20/21 1345 01/20/21 1411  BP: 117/70 125/74 (!) 124/55 117/60  Pulse: 84 91 92 87  Resp: Temp:   98.3 F (36.8 C) 98.1 F (36.7 C)  TempSrc:    Oral  SpO2: 100% 100% 98% 100%  Weight:      Height:      PainSc: 7  4  Asleep     Intake/Output Summary (Last 24 hours) at 01/20/2021 1538 Last data filed at 01/20/2021 1533 Gross per 24 hour  Intake 405 ml  Output 5000 ml  Net -4595 ml    Filed Weights   01/15/21 1600 01/20/21 0953  Weight: 65.8 kg 65.8 kg   Weight change:   Intake/Output from previous day: 07/10 0701 - 07/11 0700 In: 870 [P.O.:720; IV Piggyback:150] Out: 5810 [Urine:4500; Drains:1310] Intake/Output this shift: Total I/O In: 255 [I.V.:150; Other:5; IV Piggyback:100] Out: 1600 [Urine:1600] Filed Weights   01/15/21 1600 01/20/21 0953  Weight: 65.8 kg 65.8 kg    Examination: General exam: alert and oriented, occasionally makes paranoid statements, displays repetitive behaviors  HEENT: dental caries Respiratory system: CTAB no WRR Cardiovascular system: Did not appreciate a murmur, regular, No JVD. Gastrointestinal system: No flank pain, Abdomen soft, NT,ND, BS+. Nervous System: No focal deficits. Extremities: No edema, distal peripheral pulses palpable.  Skin: LLE wound vac, multiple skin pocks on upper extremities MSK: stable  Data Reviewed: I have personally reviewed following labs and imaging studies CBC: Recent Labs  Lab 01/15/21 1605 01/16/21 0600 01/17/21 0701 01/18/21 0510 01/19/21 0642 01/20/21 0609  WBC 26.3* 33.3* 41.8* 30.3*  21.5* 26.8*  NEUTROABS 22.9*  --  37.5* 25.5* 17.1* 21.6*  HGB 12.8 11.4* 10.7* 9.3* 9.3* 9.2*  HCT 36.9 31.9* 31.0* 26.6* 26.4* 26.6*  MCV 87.0 86.0 85.6 86.9 85.2 87.2  PLT 266 267 360 360 376 377    Basic Metabolic Panel: Recent Labs  Lab 01/16/21 0600 01/17/21 0701 01/18/21 0510 01/19/21 0642 01/20/21 0609  NA 136 135 134* 130* 132*  K 3.1* 3.7 3.5 3.6 4.0  CL 101 103 102 99 99  CO2 GLUCOSE 109* 168* 155* 86 88  BUN CREATININE 0.73 0.51 0.49 0.47 0.43*  CALCIUM 8.4* 8.3* 8.1* 7.7* 7.9*  MG  --  1.8  --   --  1.8  PHOS  --  3.3  --   --  3.4     Liver Function Tests: Recent Labs  Lab 01/15/21 1605  AST 17  ALT 14  ALKPHOS 114  BILITOT 0.5  PROT 6.4*  ALBUMIN 2.6*     Coagulation Profile: Recent Labs  Lab 01/15/21 1849  INR 1.0     Sepsis Labs: Recent Labs  Lab 01/15/21 2048 01/16/21 0027 01/17/21 0701 01/18/21 0510  LATICACIDVEN 2.7* 2.8* 1.7 1.4     Recent Results (from the past 240 hour(s))  Resp Panel by RT-PCR (Flu A&B, Covid) Nasopharyngeal Swab  Status: None   Collection Time: 01/15/21  6:49 PM   Specimen: Nasopharyngeal Swab; Nasopharyngeal(NP) swabs in vial transport medium  Result Value Ref Range Status   SARS Coronavirus 2 by RT PCR NEGATIVE NEGATIVE Final    Comment: (NOTE) SARS-CoV-2 target nucleic acids are NOT DETECTED.  The SARS-CoV-2 RNA is generally detectable in upper respiratory specimens during the acute phase of infection. The lowest concentration of SARS-CoV-2 viral copies this assay can detect is 138 copies/mL. A negative result does not preclude SARS-Cov-2 infection and should not be used as the sole basis for treatment or other patient management decisions. A negative result may occur with  improper specimen collection/handling, submission of specimen other than nasopharyngeal swab, presence of viral mutation(s) within the areas targeted by this assay, and inadequate number of  viral copies(<138 copies/mL). A negative result must be combined with clinical observations, patient history, and epidemiological information. The expected result is Negative.  Fact Sheet for Patients:  BloggerCourse.com  Fact Sheet for Healthcare Providers:  SeriousBroker.it  This test is no t yet approved or cleared by the [place] FDA and  has been authorized for detection and/or diagnosis of SARS-CoV-2 by FDA under an Emergency Use Authorization (EUA). This EUA will remain  in effect (meaning this test can be used) for the duration of the COVID-19 declaration under Section 564(b)(1) of the Act, 21 U.S.C.section 360bbb-3(b)(1), unless the authorization is terminated  or revoked sooner.       Influenza A by PCR NEGATIVE NEGATIVE Final   Influenza B by PCR NEGATIVE NEGATIVE Final    Comment: (NOTE) The Xpert Xpress SARS-CoV-2/FLU/RSV plus assay is intended as an aid in the diagnosis of influenza from Nasopharyngeal swab specimens and should not be used as a sole basis for treatment. Nasal washings and aspirates are unacceptable for Xpert Xpress SARS-CoV-2/FLU/RSV testing.  Fact Sheet for Patients: BloggerCourse.com  Fact Sheet for Healthcare Providers: SeriousBroker.it  This test is not yet approved or cleared by the [place] FDA and has been authorized for detection and/or diagnosis of SARS-CoV-2 by FDA under an Emergency Use Authorization (EUA). This EUA will remain in effect (meaning this test can be used) for the duration of the COVID-19 declaration under Section 564(b)(1) of the Act, 21 U.S.C. section 360bbb-3(b)(1), unless the authorization is terminated or revoked.  Performed at [facility], [address] [place], [place] [address]   Blood Culture (routine x 2)     Status: None   Collection Time: 01/15/21  6:49 PM   Specimen: BLOOD  Result  Value Ref Range Status   Specimen Description BLOOD RIGHT ANTECUBITAL  Final   Special Requests   Final    BOTTLES DRAWN AEROBIC AND ANAEROBIC Blood Culture results may not be optimal due to an inadequate volume of blood received in culture bottles   Culture   Final    NO GROWTH 5 DAYS Performed at [facility], [address] [place], [place] [address]    Report Status 01/20/2021 FINAL  Final  Blood Culture (routine x 2)     Status: None   Collection Time: 01/15/21  6:49 PM   Specimen: BLOOD  Result Value Ref Range Status   Specimen Description BLOOD BLOOD RIGHT HAND  Final   Special Requests   Final    BOTTLES DRAWN AEROBIC AND ANAEROBIC Blood Culture results may not be optimal due to an inadequate volume of blood received in culture bottles   Culture   Final  NO GROWTH 5 DAYS Performed at [facility], [address] [place] [place], [place] [address]    Report Status 01/20/2021 FINAL  Final  Urine culture     Status: Abnormal   Collection Time: 01/15/21  8:48 PM   Specimen: In/Out Cath Urine  Result Value Ref Range Status   Specimen Description   Final    IN/OUT CATH URINE Performed at [facility], [address] [place], [place] [address]    Special Requests   Final    NONE Performed at [facility], [address], [place], [place] [address]    Culture (A)  Final    <10,000 COLONIES/mL INSIGNIFICANT GROWTH Performed at [facility], [address], [place], [place] [address]    Report Status 01/17/2021 FINAL  Final  Aerobic Culture w Gram Stain (superficial specimen)     Status: None   Collection Time: 01/16/21  3:40 PM   Specimen: Wound  Result Value Ref Range Status   Specimen Description   Final    WOUND Performed at [facility], [address] [place], [place] [address]    Special Requests   Final    NONE Performed at [facility], [address] [place], [place] [address]    Gram Stain NO  WBC SEEN MODERATE GRAM POSITIVE COCCI   Final   Culture   Final    MODERATE METHICILLIN RESISTANT STAPHYLOCOCCUS AUREUS MODERATE STREPTOCOCCUS PYOGENES Beta hemolytic streptococci are predictably susceptible to penicillin and other beta lactams. Susceptibility testing not routinely performed. Performed at [facility], [address], [place], [place] [address]    Report Status 01/19/2021 FINAL  Final   Organism ID, Bacteria METHICILLIN RESISTANT STAPHYLOCOCCUS AUREUS  Final      Susceptibility   Methicillin resistant staphylococcus aureus - MIC*    CIPROFLOXACIN >=8 RESISTANT Resistant     ERYTHROMYCIN >=8 RESISTANT Resistant     GENTAMICIN <=0.5 SENSITIVE Sensitive     OXACILLIN >=4 RESISTANT Resistant     TETRACYCLINE <=1 SENSITIVE Sensitive     VANCOMYCIN <=0.5 SENSITIVE Sensitive     TRIMETH/SULFA 80 RESISTANT Resistant     CLINDAMYCIN <=0.25 SENSITIVE Sensitive     RIFAMPIN <=0.5 SENSITIVE Sensitive     Inducible Clindamycin NEGATIVE Sensitive     * MODERATE METHICILLIN RESISTANT STAPHYLOCOCCUS AUREUS      Radiology Studies: No results found.   LOS: 5 days   Baldwin Jamaica, MD [facility]  01/20/2021, 3:38 PM

## 2021-01-20 NOTE — Progress Notes (Signed)
Patient's wound vac is beeping, I  troubleshot with no result. Kaylee [name] , the technical support and after troubleshot on a phone  with me, it appears the solution is either  to redo the whole wound again vs. disconnecting the saline attached vs do wet to dry dressing. Notified Dr. Para March and awaiting for response. Will continue to monitor.
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
## 2021-01-20 NOTE — Progress Notes (Signed)
Dr. Para March indicated to notify Dr. Everlene Farrier. Dr. Everlene Farrier paged and awaiting new orders.

## 2021-01-20 NOTE — Interval H&P Note (Signed)
History and Physical Interval Note:  01/20/2021 10:34 AM  Kaylee [name]  has presented today for surgery, with the diagnosis of necrotizing facsitis of left lower extremity.  The various methods of treatment have been discussed with the patient and family. After consideration of risks, benefits and other options for treatment, the patient has consented to  Procedure(s): INCISION AND DRAINAGE ABSCESS (Left) APPLICATION OF WOUND VAC (Left) as a surgical intervention.  The patient's history has been reviewed, patient examined, no change in status, stable for surgery.  I have reviewed the patient's chart and labs.  Questions were answered to the patient's satisfaction.     Carolan Shiver

## 2021-01-20 NOTE — Transfer of Care (Signed)
Immediate Anesthesia Transfer of Care Note  Patient: Kaylee [name]  Procedure(s) Performed: INCISION AND DRAINAGE ABSCESS (Left) APPLICATION OF WOUND VAC (Left)  Patient Location: PACU    Anesthesia Type:General  Level of Consciousness: awake and alert   Airway & Oxygen Therapy: Patient Spontanous Breathing and Patient connected to face mask oxygen  Post-op Assessment: Report given to RN and Post -op Vital signs reviewed and stable  Post vital signs: Reviewed and stable  Last Vitals:  Vitals Value Taken Time  BP    Temp    Pulse    Resp    SpO2      Last Pain:  Vitals:   01/20/21 0953  TempSrc: Temporal  PainSc: 6       Patients Stated Pain Goal: 0 (01/20/21 0953)  Complications: No notable events documented.

## 2021-01-20 NOTE — Progress Notes (Signed)
Per night RN instill tubing blocked at approx 0445, attempts were made to fix with support, but not able, oncall Md made aware last night and surgical MD made aware at approx 0730. Surgical MD said to leave as is as pt was having the [place] changed in OR today

## 2021-01-20 NOTE — Op Note (Addendum)
ATTENDING Surgeon(s): Carolan Shiver, MD   ANESTHESIA: General   PRE-OPERATIVE DIAGNOSIS: Left lower extremity necrotizing fasciitis   POST-OPERATIVE DIAGNOSIS: Left lower extremity necrotizing fasciitis   PROCEDURE(S): 1.) Sharp excisional debridement of left lower extremity 2.)  Negative pressure dressing DME placement   INTRAOPERATIVE FINDINGS:  Preoperatively there was a 62 cm long x 30 cm wide open wound Postoperatively there was a 70 cm long x 30 cm wide (2100 sq cm) Today, lateral wound was extended to further ischemic tissue.           ESTIMATED BLOOD LOSS: 50 mL    SPECIMENS: None   COMPLICATIONS: None apparent   CONDITION AT END OF PROCEDURE: Hemodynamically stable and awake   INDICATIONS FOR PROCEDURE:  Patient is 60 year old female with necrotizing fasciitis. Third look indicated due to increase in WBC count and fever to rule out extension of infection and for exchange of negative pressure dressing.     DETAILS OF PROCEDURE: After informed consent,patient was taken to the OR. Time out performed. General anesthesia induced. Patient placed on supine position. Negative pressure dressing removed. The left lower extremity was cleaned and draped in sterile fashion.  The open wounds were thoroughly irrigated with saline.  The lateral wound was extended down to the ankle. The incision was done on the lateral medial thigh.  Dissection was carried down to the fascia.  The muscle and the fascia was separated completely. No significant amount of pus was identified. There was some liquefactive necrosis of the dermis.  Hemostasis was achieved.  I personally placed the complex negative pressure dressing.  Patient basically has 2 separate negative pressure dressing system but now both has irrigation systems.
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
## 2021-01-21 ENCOUNTER — Encounter: Payer: Self-pay | Admitting: General Surgery

## 2021-01-21 DIAGNOSIS — M726 Necrotizing fasciitis: Secondary | ICD-10-CM | POA: Diagnosis not present

## 2021-01-21 DIAGNOSIS — Z8619 Personal history of other infectious and parasitic diseases: Secondary | ICD-10-CM | POA: Diagnosis not present

## 2021-01-21 DIAGNOSIS — F319 Bipolar disorder, unspecified: Secondary | ICD-10-CM | POA: Diagnosis not present

## 2021-01-21 DIAGNOSIS — J432 Centrilobular emphysema: Secondary | ICD-10-CM | POA: Diagnosis not present

## 2021-01-21 DIAGNOSIS — L03116 Cellulitis of left lower limb: Secondary | ICD-10-CM | POA: Diagnosis not present

## 2021-01-21 LAB — GLUCOSE, CAPILLARY
Glucose-Capillary: 119 mg/dL — ABNORMAL HIGH (ref 70–99)
Glucose-Capillary: 117 mg/dL — ABNORMAL HIGH (ref 70–99)
Glucose-Capillary: 96 mg/dL (ref 70–99)
Glucose-Capillary: 97 mg/dL (ref 70–99)
Glucose-Capillary: 108 mg/dL — ABNORMAL HIGH (ref 70–99)

## 2021-01-21 LAB — CBC WITH DIFFERENTIAL/PLATELET
Abs Immature Granulocytes: 0.6 10*3/uL — ABNORMAL HIGH (ref 0.00–0.07)
Basophils Absolute: 0 10*3/uL (ref 0.0–0.1)
Basophils Relative: 0 %
Eosinophils Absolute: 0 10*3/uL (ref 0.0–0.5)
Eosinophils Relative: 0 %
HCT: 25.7 % — ABNORMAL LOW (ref 36.0–46.0)
Hemoglobin: 8.7 g/dL — ABNORMAL LOW (ref 12.0–15.0)
Immature Granulocytes: 3 %
Lymphocytes Relative: 11 %
Lymphs Abs: 2.7 10*3/uL (ref 0.7–4.0)
MCH: 30.1 pg (ref 26.0–34.0)
MCHC: 33.9 g/dL (ref 30.0–36.0)
MCV: 88.9 fL (ref 80.0–100.0)
Monocytes Absolute: 1.1 10*3/uL — ABNORMAL HIGH (ref 0.1–1.0)
Monocytes Relative: 4 %
Neutro Abs: 20 10*3/uL — ABNORMAL HIGH (ref 1.7–7.7)
Neutrophils Relative %: 82 %
Platelets: 423 10*3/uL — ABNORMAL HIGH (ref 150–400)
RBC: 2.89 MIL/uL — ABNORMAL LOW (ref 3.87–5.11)
RDW: 15.1 % (ref 11.5–15.5)
WBC: 24.5 10*3/uL — ABNORMAL HIGH (ref 4.0–10.5)
nRBC: 0 % (ref 0.0–0.2)

## 2021-01-21 LAB — BASIC METABOLIC PANEL
Anion gap: 5 (ref 5–15)
BUN: 10 mg/dL (ref 8–23)
CO2: 29 mmol/L (ref 22–32)
Calcium: 7.7 mg/dL — ABNORMAL LOW (ref 8.9–10.3)
Chloride: 97 mmol/L — ABNORMAL LOW (ref 98–111)
Creatinine, Ser: 0.41 mg/dL — ABNORMAL LOW (ref 0.44–1.00)
GFR, Estimated: >60 mL/min (ref >60)
Glucose, Bld: 167 mg/dL — ABNORMAL HIGH (ref 70–99)
Potassium: 4 mmol/L (ref 3.5–5.1)
Sodium: 131 mmol/L — ABNORMAL LOW (ref 135–145)

## 2021-01-21 MED ORDER — DIAZEPAM 5 MG PO TABS
5.0000 mg | ORAL_TABLET | Freq: Four times a day (QID) | ORAL | Status: DC
  Administered 2021-01-21 – 2021-02-04 (×46): 5 mg via ORAL
  Filled 2021-01-21 (×46): qty 1

## 2021-01-21 MED ORDER — SODIUM CHLORIDE 0.9 % IV SOLN: INTRAVENOUS | Status: DC | PRN

## 2021-01-21 MED ORDER — PANTOPRAZOLE SODIUM 20 MG PO TBEC
20.0000 mg | DELAYED_RELEASE_TABLET | Freq: Every day | ORAL | Status: DC
  Administered 2021-01-22 – 2021-02-03 (×12): 20 mg via ORAL
  Filled 2021-01-21 (×14): qty 1

## 2021-01-21 NOTE — TOC Progression Note (Signed)
Transition of Care [facility]) - Progression Note    Patient Details  Name: Kaylee [name] MRN: [id number] Date of Birth: Nov 24, 1960  [facility]) CM/SW Contact  Barrie Dunker, RN Phone Number: 01/21/2021, 10:53 AM  Clinical Narrative:      The patient remains not medically ready to discharge, plan to go to OR tomorrow, TOC CM will continue to Monitor for needs      Expected Discharge Plan and Services                                                 Social Determinants of Health (SDOH) Interventions    Readmission Risk Interventions No flowsheet data found.

## 2021-01-21 NOTE — Progress Notes (Signed)
[facility] PROGRESS NOTE    Kaylee [name]  ALP:[id number] DOB: [birth year] DOA: 01/15/2021 PCP: [name], MD      Brief Narrative:  Kaylee [name] is a 60 y.o. F with COPD/smoking, recurrent MRSA infection, chart history of seronegative RA not on meds, and illicit drug use who presented with redness, swelling, and pain in the left lower extremity with diffuse erythema, large necrotic blisters and 10 skin, WBC 30 K, lactic acidosis.  Orthopedics were consulted for necrotizing fasciitis and she was taken to the OR.         Assessment & Plan:  Necrotizing fasciitis S/p wound debridement 7/7 by Dr. Hazle Quant S/p wound debridement 7/8 by Dr. Hazle Quant S/p wound debridement 7/11 by Dr. Rande Lawman cultures from 7/7 growing MRSA and S pyogenes.    WBC trending down, no fever, HR and BP normal, mentation good, PO good  See more extensive progress note from Dr. Juel Burrow on 7/11. -Consult ID, appreciate cares -Continue Linezolid   -Continue Linzess for opioid-induced constipation   Substance use disorder See more extensive progress note from 7/11 by Dr. Juel Burrow.  Substance-induced mood disorder/bipolar depression Mood has remained stable. Celexa discontinued while on linezolid and Abilify reduced to 5 mg daily Given Abilify, ID and Dr. Harl Bowie recommended scheduled Valium to prevent serotonin syndrome while on linezolid  -Continue Abilify, lower dose - Continue diazepam, every 6   Hypokalemia Resolved with treatment  COPD No active disease Has been asymptomatic off her home ICS/LABA/LAMA  Hyponatremia Stable, mild, asymptomatic  GERD -Continue PPI  Anemia Likely subacute blood loss from nec-fasc.  -Trend Hgb          Disposition: Status is: Inpatient  Remains inpatient appropriate because: ongoing severe limb threatening infection requiring ongoing debridement  Dispo: The patient is from: Home               Anticipated d/c is to: SNF              Patient currently is not medically stable to d/c.   Difficult to place patient No       Level of care: Med-Surg       MDM: The below labs and imaging reports were reviewed and summarized above.  Medication management as above.    DVT prophylaxis: enoxaparin (LOVENOX) injection 40 mg Start: 01/20/21 2200  Code Status: FULL Family Communication:            Subjective: No headache, chest pain, abdominal pain, dyspnea, vomiting.  No wheezing.  Left leg pain is well controlled.  No fever.  Objective: Vitals:   01/20/21 2044 01/21/21 0630 01/21/21 0755 01/21/21 1109  BP: (!) 111/58 112/63 101/64 (!) 104/55  Pulse: 79 76 74 80  Resp: 17 17 17 17   Temp: 98.3 F (36.8 C) 98 F (36.7 C) 97.8 F (36.6 C) 98.4 F (36.9 C)  TempSrc:    Oral  SpO2: 97% 99% 98% 98%  Weight:      Height:        Intake/Output Summary (Last 24 hours) at 01/21/2021 1512 Last data filed at 01/21/2021 03/24/2021 Gross per 24 hour  Intake 300 ml  Output 3100 ml  Net -2800 ml   Filed Weights   01/15/21 1600 01/20/21 0953  Weight: 65.8 kg 65.8 kg    Examination: General appearance:  adult female, alert and in no obvious distress.   HEENT: Anicteric, conjunctiva pink, lids and lashes normal. No nasal deformity, discharge, epistaxis.  Oropharynx tacky  dry, no oral lesions.   Skin: Warm and dry.  no jaundice.  No suspicious rashes or lesions.  The left leg has gotten wound vacs to both sides, clean healthy tissue is visible in the wound VAC. Cardiac: RRR, nl S1-S2, no murmurs appreciated.  Capillary refill is brisk.  JVP normal.  No LE edema.  Radial pulses 2+ and symmetric. Respiratory: Normal respiratory rate and rhythm.  CTAB without rales or wheezes. Abdomen: Abdomen soft.  no TTP. No ascites, distension, hepatosplenomegaly.   MSK: No deformities or effusions.  Other than wound vacs to the left leg with some mild surrounding redness. Neuro: Awake and  alert.  EOMI, moves all extremities. Speech fluent.    Psych: Sensorium intact and responding to questions, attention normal. Affect normal.  Judgment and insight appear normal.    Data Reviewed: I have personally reviewed following labs and imaging studies:  CBC: Recent Labs  Lab 01/17/21 0701 01/18/21 0510 01/19/21 0642 01/20/21 0609 01/21/21 0437  WBC 41.8* 30.3* 21.5* 26.8* 24.5*  NEUTROABS 37.5* 25.5* 17.1* 21.6* 20.0*  HGB 10.7* 9.3* 9.3* 9.2* 8.7*  HCT 31.0* 26.6* 26.4* 26.6* 25.7*  MCV 85.6 86.9 85.2 87.2 88.9  PLT 360 360 376 377 423*   Basic Metabolic Panel: Recent Labs  Lab 01/17/21 0701 01/18/21 0510 01/19/21 0642 01/20/21 0609 01/21/21 0437  NA 135 134* 130* 132* 131*  K 3.7 3.5 3.6 4.0 4.0  CL 103 102 99 99 97*  CO2 GLUCOSE 168* 155* 86 88 167*  BUN CREATININE 0.51 0.49 0.47 0.43* 0.41*  CALCIUM 8.3* 8.1* 7.7* 7.9* 7.7*  MG 1.8  --   --  1.8  --   PHOS 3.3  --   --  3.4  --    GFR: Estimated Creatinine Clearance: 69.1 mL/min (A) (by C-G formula based on SCr of 0.41 mg/dL (L)). Liver Function Tests: Recent Labs  Lab 01/15/21 1605  AST 17  ALT 14  ALKPHOS 114  BILITOT 0.5  PROT 6.4*  ALBUMIN 2.6*   No results for input(s): LIPASE, AMYLASE in the last 168 hours. No results for input(s): AMMONIA in the last 168 hours. Coagulation Profile: Recent Labs  Lab 01/15/21 1849  INR 1.0   Cardiac Enzymes: Recent Labs  Lab 01/18/21 0510  CKTOTAL 18*   BNP (last 3 results) No results for input(s): PROBNP in the last 8760 hours. HbA1C: No results for input(s): HGBA1C in the last 72 hours. CBG: Recent Labs  Lab 01/16/21 1700 01/20/21 2046 01/21/21 0803 01/21/21 1222 01/21/21 1457  GLUCAP 186* 170* 119* 117* 96   Lipid Profile: No results for input(s): CHOL, HDL, LDLCALC, TRIG, CHOLHDL, LDLDIRECT in the last 72 hours. Thyroid Function Tests: No results for input(s): TSH, T4TOTAL, FREET4, T3FREE, THYROIDAB  in the last 72 hours. Anemia Panel: No results for input(s): VITAMINB12, FOLATE, FERRITIN, TIBC, IRON, RETICCTPCT in the last 72 hours. Urine analysis:    Component Value Date/Time   COLORURINE STRAW (A) 01/15/2021 2048   APPEARANCEUR CLEAR (A) 01/15/2021 2048   APPEARANCEUR Cloudy 07/01/2014 1905   LABSPEC 1.004 (L) 01/15/2021 2048   LABSPEC 1.016 07/01/2014 1905   PHURINE 5.0 01/15/2021 2048   GLUCOSEU NEGATIVE 01/15/2021 2048   GLUCOSEU Negative 07/01/2014 1905   HGBUR SMALL (A) 01/15/2021 2048   BILIRUBINUR NEGATIVE 01/15/2021 2048   BILIRUBINUR negative 04/19/2017 1559   BILIRUBINUR Negative 07/01/2014 1905   KETONESUR NEGATIVE 01/15/2021 2048  PROTEINUR NEGATIVE 01/15/2021 2048   UROBILINOGEN 0.2 04/19/2017 1559   NITRITE NEGATIVE 01/15/2021 2048   LEUKOCYTESUR TRACE (A) 01/15/2021 2048   LEUKOCYTESUR Negative 07/01/2014 1905   Sepsis Labs: @LABRCNTIP (procalcitonin:4,lacticacidven:4)  ) Recent Results (from the past 240 hour(s))  Resp Panel by RT-PCR (Flu A&B, Covid) Nasopharyngeal Swab     Status: None   Collection Time: 01/15/21  6:49 PM   Specimen: Nasopharyngeal Swab; Nasopharyngeal(NP) swabs in vial transport medium  Result Value Ref Range Status   SARS Coronavirus 2 by RT PCR NEGATIVE NEGATIVE Final    Comment: (NOTE) SARS-CoV-2 target nucleic acids are NOT DETECTED.  The SARS-CoV-2 RNA is generally detectable in upper respiratory specimens during the acute phase of infection. The lowest concentration of SARS-CoV-2 viral copies this assay can detect is 138 copies/mL. A negative result does not preclude SARS-Cov-2 infection and should not be used as the sole basis for treatment or other patient management decisions. A negative result may occur with  improper specimen collection/handling, submission of specimen other than nasopharyngeal swab, presence of viral mutation(s) within the areas targeted by this assay, and inadequate number of viral copies(<138  copies/mL). A negative result must be combined with clinical observations, patient history, and epidemiological information. The expected result is Negative.  Fact Sheet for Patients:  BloggerCourse.com  Fact Sheet for Healthcare Providers:  SeriousBroker.it  This test is no t yet approved or cleared by the [place] FDA and  has been authorized for detection and/or diagnosis of SARS-CoV-2 by FDA under an Emergency Use Authorization (EUA). This EUA will remain  in effect (meaning this test can be used) for the duration of the COVID-19 declaration under Section 564(b)(1) of the Act, 21 U.S.C.section 360bbb-3(b)(1), unless the authorization is terminated  or revoked sooner.       Influenza A by PCR NEGATIVE NEGATIVE Final   Influenza B by PCR NEGATIVE NEGATIVE Final    Comment: (NOTE) The Xpert Xpress SARS-CoV-2/FLU/RSV plus assay is intended as an aid in the diagnosis of influenza from Nasopharyngeal swab specimens and should not be used as a sole basis for treatment. Nasal washings and aspirates are unacceptable for Xpert Xpress SARS-CoV-2/FLU/RSV testing.  Fact Sheet for Patients: BloggerCourse.com  Fact Sheet for Healthcare Providers: SeriousBroker.it  This test is not yet approved or cleared by the [place] FDA and has been authorized for detection and/or diagnosis of SARS-CoV-2 by FDA under an Emergency Use Authorization (EUA). This EUA will remain in effect (meaning this test can be used) for the duration of the COVID-19 declaration under Section 564(b)(1) of the Act, 21 U.S.C. section 360bbb-3(b)(1), unless the authorization is terminated or revoked.  Performed at [facility], [address] [place], [place] [address]   Blood Culture (routine x 2)     Status: None   Collection Time: 01/15/21  6:49 PM   Specimen: BLOOD  Result Value Ref Range  Status   Specimen Description BLOOD RIGHT ANTECUBITAL  Final   Special Requests   Final    BOTTLES DRAWN AEROBIC AND ANAEROBIC Blood Culture results may not be optimal due to an inadequate volume of blood received in culture bottles   Culture   Final    NO GROWTH 5 DAYS Performed at [facility], [address], [place], [place] [address]    Report Status 01/20/2021 FINAL  Final  Blood Culture (routine x 2)     Status: None   Collection Time: 01/15/21  6:49 PM   Specimen: BLOOD  Result Value Ref Range Status   Specimen Description BLOOD BLOOD RIGHT HAND  Final   Special Requests   Final    BOTTLES DRAWN AEROBIC AND ANAEROBIC Blood Culture results may not be optimal due to an inadequate volume of blood received in culture bottles   Culture   Final    NO GROWTH 5 DAYS Performed at [facility], [address], [place], [place] [address]    Report Status 01/20/2021 FINAL  Final  Urine culture     Status: Abnormal   Collection Time: 01/15/21  8:48 PM   Specimen: In/Out Cath Urine  Result Value Ref Range Status   Specimen Description   Final    IN/OUT CATH URINE Performed at [facility], [address], [place], [place] [address]    Special Requests   Final    NONE Performed at [facility], [address] [place], [place] [address]    Culture (A)  Final    <10,000 COLONIES/mL INSIGNIFICANT GROWTH Performed at [facility], [address] [place], [place] [address]    Report Status 01/17/2021 FINAL  Final  Aerobic Culture w Gram Stain (superficial specimen)     Status: None   Collection Time: 01/16/21  3:40 PM   Specimen: Wound  Result Value Ref Range Status   Specimen Description   Final    WOUND Performed at [facility], [address] [place], [place] [address]    Special Requests   Final    NONE Performed at [facility], [address] [place], [place] [address]    Gram Stain NO WBC  SEEN MODERATE GRAM POSITIVE COCCI   Final   Culture   Final    MODERATE METHICILLIN RESISTANT STAPHYLOCOCCUS AUREUS MODERATE STREPTOCOCCUS PYOGENES Beta hemolytic streptococci are predictably susceptible to penicillin and other beta lactams. Susceptibility testing not routinely performed. Performed at [facility], [address] [place], [place] [address]    Report Status 01/19/2021 FINAL  Final   Organism ID, Bacteria METHICILLIN RESISTANT STAPHYLOCOCCUS AUREUS  Final      Susceptibility   Methicillin resistant staphylococcus aureus - MIC*    CIPROFLOXACIN >=8 RESISTANT Resistant     ERYTHROMYCIN >=8 RESISTANT Resistant     GENTAMICIN <=0.5 SENSITIVE Sensitive     OXACILLIN >=4 RESISTANT Resistant     TETRACYCLINE <=1 SENSITIVE Sensitive     VANCOMYCIN <=0.5 SENSITIVE Sensitive     TRIMETH/SULFA 80 RESISTANT Resistant     CLINDAMYCIN <=0.25 SENSITIVE Sensitive     RIFAMPIN <=0.5 SENSITIVE Sensitive     Inducible Clindamycin NEGATIVE Sensitive     * MODERATE METHICILLIN RESISTANT STAPHYLOCOCCUS AUREUS         Radiology Studies: No results found.      Scheduled Meds:  ARIPiprazole  5 mg Oral Daily   Chlorhexidine Gluconate Cloth  6 each Topical Daily   Chlorhexidine Gluconate Cloth  6 each Topical Q0600   diazepam  5 mg Oral Q6H   enoxaparin (LOVENOX) injection  40 mg Subcutaneous Q24H   insulin aspart  0-5 Units Subcutaneous QHS   insulin aspart  0-9 Units Subcutaneous TID WC   linaclotide  290 mcg Oral QAC breakfast   [START ON 01/22/2021] pantoprazole  20 mg Oral Daily   polyethylene glycol  17 g Oral Daily   senna-docusate  2 tablet Oral Daily   Continuous Infusions:  linezolid (ZYVOX) IV 600 mg (01/21/21 0823)     LOS: 6  days    Time spent: 5 minutes    Alberteen Sam, MD [facility] 01/21/2021, 3:12 PM     Please page though AMION or Epic secure chat:  For [employer], Higher education careers adviser

## 2021-01-21 NOTE — Progress Notes (Signed)
Patient ID: [name], female   DOB: September 23, 1960, 60 y.o.   MRN: [id number]     SURGICAL PROGRESS NOTE   Hospital Day(s): 6.   Interval History: Patient seen and examined, no acute events or new complaints overnight. Patient reports feeling a little bit better today.  She still has significant pain on the left leg with touch.  Patient reports having an issue with the wound VAC overnight but nursing denies any report of any issues with the wound VAC.  On my evaluation the wound VAC was working adequately.  Patient today is more alert and having a better conversation spam.  Vital signs in last 24 hours: [min-max] current  Temp:  [97.8 F (36.6 C)-100.2 F (37.9 C)] 97.8 F (36.6 C) (07/12 0755) Pulse Rate:  [74-94] 74 (07/12 0755) Resp:  [14-20] 17 (07/12 0755) BP: (101-125)/(54-74) 101/64 (07/12 0755) SpO2:  [96 %-100 %] 98 % (07/12 0755) Weight:  [65.8 kg] 65.8 kg (07/11 0953)     Height: 5\' 6"  (167.6 cm) Weight: 65.8 kg BMI (Calculated): 23.42   Physical Exam:  Constitutional: alert, cooperative and no distress  Respiratory: breathing non-labored at rest  Cardiovascular: regular rate and sinus rhythm  Extremity: Left leg with decreased swelling.  Negative pressure dressing in place and working adequately.  Distal leg area of partial ischemia.  Canister with clean fluid.  Labs:  CBC Latest Ref Rng & Units 01/21/2021 01/20/2021 01/19/2021  WBC 4.0 - 10.5 K/uL 24.5(H) 26.8(H) 21.5(H)  Hemoglobin 12.0 - 15.0 g/dL 03/22/2021) 7.6(A) 2.6(J)  Hematocrit 36.0 - 46.0 % 25.7(L) 26.6(L) 26.4(L)  Platelets 150 - 400 K/uL 423(H) 377 376   CMP Latest Ref Rng & Units 01/21/2021 01/20/2021 01/19/2021  Glucose 70 - 99 mg/dL 03/22/2021) 88 86  BUN 8 - 23 mg/dL 10 9 10   Creatinine 0.44 - 1.00 mg/dL 456(Y) ) 5.63(S  Sodium 135 - 145 mmol/L 131(L) 132(L) 130(L)  Potassium 3.5 - 5.1 mmol/L 4.0 4.0 3.6  Chloride 98 - 111 mmol/L 97(L) 99 99  CO2 22 - 32 mmol/L 29 28 27   Calcium 8.9 - 10.3 mg/dL 7.7(L)  7.9(L) 7.7(L)  Total Protein 6.5 - 8.1 g/dL - - -  Total Bilirubin 0.3 - 1.2 mg/dL - - -  Alkaline Phos 38 - 126 U/L - - -  AST 15 - 41 U/L - - -  ALT 0 - 44 U/L - - -    Imaging studies: No new pertinent imaging studies   Assessment/Plan:  60 y.o. female with necrotizing fasciitis of the left lower extremity 5 Day Post-Op s/p extensive cleansing and debridement, complicated by pertinent comorbidities including COPD, bipolar type I.  Patient today more alert and having more conversation.  Negative pressure dressing working adequately.  I discussed with the patient the plan to go back to the operating room tomorrow for exchange of the negative pressure dressing.  Possible debridement if needed.  Continue medical management as per hospitalist.  4.28, MD

## 2021-01-21 NOTE — Progress Notes (Signed)
   Date of Admission:  01/15/2021       Subjective: Pt doing better Says left leg feels light and less pain  Medications:   ARIPiprazole  5 mg Oral Daily   Chlorhexidine Gluconate Cloth  6 each Topical Daily   Chlorhexidine Gluconate Cloth  6 each Topical Q0600   diazepam  5 mg Oral Q6H   enoxaparin (LOVENOX) injection  40 mg Subcutaneous Q24H   insulin aspart  0-5 Units Subcutaneous QHS   insulin aspart  0-9 Units Subcutaneous TID WC   linaclotide  290 mcg Oral QAC breakfast   [START ON 01/22/2021] pantoprazole  20 mg Oral Daily   polyethylene glycol  17 g Oral Daily   senna-docusate  2 tablet Oral Daily    Objective: Vital signs in last 24 hours: Temp:  [97.8 F (36.6 C)-98.4 F (36.9 C)] 98.4 F (36.9 C) (07/12 1109) Pulse Rate:  [74-80] 80 (07/12 1109) Resp:  [17] 17 (07/12 1109) BP: (101-112)/(55-64) 104/55 (07/12 1109) SpO2:  [97 %-99 %] 98 % (07/12 1109)  PHYSICAL EXAM:  General: Alert, cooperative, no distress, appears stated age.  Head: Normocephalic, without obvious abnormality, atraumatic. Eyes: Conjunctivae clear, anicteric sclerae. Pupils are equal ENT Nares normal. No drainage or sinus tenderness. Lips, mucosa, and tongue normal. No Thrush Neck: Supple, symmetrical, no adenopathy, thyroid: non tender no carotid bruit and no JVD. Lungs: Clear to auscultation bilaterally. No Wheezing or Rhonchi. No rales. Heart: Regular rate and rhythm, no murmur, rub or gallop. Abdomen: Soft, non-tender,not distended. Bowel sounds normal. No masses Extremities: left leg- swelling much improved Wound vac X 2  Skin: No rashes or lesions. Or bruising Lymph: Cervical, supraclavicular normal. Neurologic: Grossly non-focal  Lab Results Recent Labs    01/20/21 0609 01/21/21 0437  WBC 26.8* 24.5*  HGB 9.2* 8.7*  HCT 26.6* 25.7*  NA 132* 131*  K 4.0 4.0  CL 99 97*  CO2 28 29  BUN 9 10  CREATININE 0.43* 0.41*    Assessment/Plan: Necrotizing fascitis of the left leg  due to streptococcus pyogenes and MRSA- now on linezolid and improving Leucocytosis trailing No evidence of serotonin syndrome clinically  Parasitophobia  Polysubstance use  Discussed the management with the patient

## 2021-01-22 ENCOUNTER — Inpatient Hospital Stay: Payer: Medicare Other | Admitting: Anesthesiology

## 2021-01-22 ENCOUNTER — Encounter
Admission: EM | Disposition: A | Discharge status: Home / Self Care | Payer: Self-pay | Source: Home / Self Care | Attending: Internal Medicine

## 2021-01-22 DIAGNOSIS — D75839 Thrombocytosis, unspecified: Secondary | ICD-10-CM | POA: Diagnosis not present

## 2021-01-22 DIAGNOSIS — M726 Necrotizing fasciitis: Secondary | ICD-10-CM | POA: Diagnosis not present

## 2021-01-22 DIAGNOSIS — D72828 Other elevated white blood cell count: Secondary | ICD-10-CM | POA: Diagnosis not present

## 2021-01-22 HISTORY — PX: APPLICATION OF WOUND VAC: SHX5189

## 2021-01-22 HISTORY — PX: INCISION AND DRAINAGE ABSCESS: SHX5864

## 2021-01-22 LAB — CBC
HCT: 25.4 % — ABNORMAL LOW (ref 36.0–46.0)
Hemoglobin: 8.5 g/dL — ABNORMAL LOW (ref 12.0–15.0)
MCH: 30.7 pg (ref 26.0–34.0)
MCHC: 33.5 g/dL (ref 30.0–36.0)
MCV: 91.7 fL (ref 80.0–100.0)
Platelets: 412 10*3/uL — ABNORMAL HIGH (ref 150–400)
RBC: 2.77 MIL/uL — ABNORMAL LOW (ref 3.87–5.11)
RDW: 15.5 % (ref 11.5–15.5)
WBC: 17.1 10*3/uL — ABNORMAL HIGH (ref 4.0–10.5)
nRBC: 0 % (ref 0.0–0.2)

## 2021-01-22 LAB — BASIC METABOLIC PANEL
Anion gap: 3 — ABNORMAL LOW (ref 5–15)
BUN: 13 mg/dL (ref 8–23)
CO2: 31 mmol/L (ref 22–32)
Calcium: 7.9 mg/dL — ABNORMAL LOW (ref 8.9–10.3)
Chloride: 99 mmol/L (ref 98–111)
Creatinine, Ser: 0.42 mg/dL — ABNORMAL LOW (ref 0.44–1.00)
GFR, Estimated: >60 mL/min (ref >60)
Glucose, Bld: 93 mg/dL (ref 70–99)
Potassium: 4.4 mmol/L (ref 3.5–5.1)
Sodium: 133 mmol/L — ABNORMAL LOW (ref 135–145)

## 2021-01-22 LAB — GLUCOSE, CAPILLARY
Glucose-Capillary: 85 mg/dL (ref 70–99)
Glucose-Capillary: 75 mg/dL (ref 70–99)
Glucose-Capillary: 81 mg/dL (ref 70–99)
Glucose-Capillary: 118 mg/dL — ABNORMAL HIGH (ref 70–99)
Glucose-Capillary: 124 mg/dL — ABNORMAL HIGH (ref 70–99)

## 2021-01-22 SURGERY — INCISION AND DRAINAGE, ABSCESS
Anesthesia: General | Laterality: Left

## 2021-01-22 MED ORDER — FENTANYL CITRATE (PF) 100 MCG/2ML IJ SOLN
INTRAMUSCULAR | Status: DC | PRN
  Administered 2021-01-22 (×5): 50 ug via INTRAVENOUS

## 2021-01-22 MED ORDER — ACETAMINOPHEN 10 MG/ML IV SOLN
INTRAVENOUS | Status: AC
  Filled 2021-01-22: qty 100

## 2021-01-22 MED ORDER — PROPOFOL 10 MG/ML IV BOLUS
INTRAVENOUS | Status: AC
  Filled 2021-01-22: qty 60

## 2021-01-22 MED ORDER — ONDANSETRON HCL 4 MG/2ML IJ SOLN
INTRAMUSCULAR | Status: DC | PRN
  Administered 2021-01-22: 4 mg via INTRAVENOUS

## 2021-01-22 MED ORDER — ONDANSETRON HCL 4 MG/2ML IJ SOLN
4.0000 mg | Freq: Once | INTRAMUSCULAR | Status: DC | PRN
Start: 2021-01-22 — End: 2021-01-22

## 2021-01-22 MED ORDER — MIDAZOLAM HCL 2 MG/2ML IJ SOLN
INTRAMUSCULAR | Status: DC | PRN
  Administered 2021-01-22 (×2): 1 mg via INTRAVENOUS

## 2021-01-22 MED ORDER — FENTANYL CITRATE (PF) 100 MCG/2ML IJ SOLN
INTRAMUSCULAR | Status: AC
  Administered 2021-01-22: 50 ug via INTRAVENOUS
  Filled 2021-01-22: qty 2

## 2021-01-22 MED ORDER — LIDOCAINE HCL (CARDIAC) PF 100 MG/5ML IV SOSY
PREFILLED_SYRINGE | INTRAVENOUS | Status: DC | PRN
  Administered 2021-01-22: 70 mg via INTRAVENOUS

## 2021-01-22 MED ORDER — FENTANYL CITRATE (PF) 100 MCG/2ML IJ SOLN
INTRAMUSCULAR | Status: AC
  Filled 2021-01-22: qty 2

## 2021-01-22 MED ORDER — MIDAZOLAM HCL 2 MG/2ML IJ SOLN
INTRAMUSCULAR | Status: AC
  Filled 2021-01-22: qty 2

## 2021-01-22 MED ORDER — 0.9 % SODIUM CHLORIDE (POUR BTL) OPTIME
TOPICAL | Status: DC | PRN
  Administered 2021-01-22: 500 mL

## 2021-01-22 MED ORDER — ACETAMINOPHEN 10 MG/ML IV SOLN: 1000.0000 mg | Freq: Once | INTRAVENOUS | Status: DC | PRN

## 2021-01-22 MED ORDER — DEXAMETHASONE SODIUM PHOSPHATE 10 MG/ML IJ SOLN
INTRAMUSCULAR | Status: DC | PRN
  Administered 2021-01-22: 4 mg via INTRAVENOUS

## 2021-01-22 MED ORDER — FENTANYL CITRATE (PF) 100 MCG/2ML IJ SOLN
25.0000 ug | INTRAMUSCULAR | Status: DC | PRN
  Administered 2021-01-22: 50 ug via INTRAVENOUS

## 2021-01-22 MED ORDER — ACETAMINOPHEN 10 MG/ML IV SOLN
INTRAVENOUS | Status: DC | PRN
  Administered 2021-01-22: 1000 mg via INTRAVENOUS

## 2021-01-22 MED ORDER — SODIUM CHLORIDE 0.9 % IV SOLN: INTRAVENOUS | Status: DC | PRN

## 2021-01-22 MED ORDER — PROPOFOL 10 MG/ML IV BOLUS
INTRAVENOUS | Status: DC | PRN
  Administered 2021-01-22: 130 mg via INTRAVENOUS

## 2021-01-22 SURGICAL SUPPLY — 26 items
BRUSH SCRUB EZ  4% CHG (MISCELLANEOUS) ×2
BRUSH SCRUB EZ 4% CHG (MISCELLANEOUS) ×1 IMPLANT
CANISTER WOUND CARE 500ML ATS (WOUND CARE) ×3 IMPLANT
DRAPE CHEST BREAST 77X106 FENE (MISCELLANEOUS) IMPLANT
DRAPE DERMATAC (DRAPES) ×6 IMPLANT
DRAPE LAPAROTOMY 100X77 ABD (DRAPES) ×2 IMPLANT
DRAPE LAPAROTOMY 77X122 PED (DRAPES) ×1 IMPLANT
DRESSING VERAFLO CLEANSE CC (GAUZE/BANDAGES/DRESSINGS) IMPLANT
DRSG TEGADERM 4X4.75 (GAUZE/BANDAGES/DRESSINGS) ×1 IMPLANT
DRSG VAC ATS MED SENSATRAC (GAUZE/BANDAGES/DRESSINGS) ×1 IMPLANT
DRSG VERAFLO CLEANSE CC (GAUZE/BANDAGES/DRESSINGS) ×4 IMPLANT
ELECT REM PT RETURN 9FT ADLT (ELECTROSURGICAL) ×2 IMPLANT
ELECTRODE REM PT RTRN 9FT ADLT (ELECTROSURGICAL) ×1 IMPLANT
GAUZE 4X4 16PLY ~~LOC~~+RFID DBL (SPONGE) ×2 IMPLANT
GLOVE SURG ENC MOIS LTX SZ6.5 (GLOVE) ×3 IMPLANT
GLOVE SURG UNDER POLY LF SZ6.5 (GLOVE) ×2 IMPLANT
GOWN STRL REUS W/ TWL LRG LVL3 (GOWN DISPOSABLE) ×2 IMPLANT
GOWN STRL REUS W/TWL LRG LVL3 (GOWN DISPOSABLE) ×4
KIT TURNOVER KIT A (KITS) ×2 IMPLANT
MANIFOLD NEPTUNE II (INSTRUMENTS) ×2 IMPLANT
NEEDLE HYPO 22GX1.5 SAFETY (NEEDLE) ×1 IMPLANT
NS IRRIG 1000ML POUR BTL (IV SOLUTION) ×2 IMPLANT
PACK BASIN MINOR ARMC (MISCELLANEOUS) ×2 IMPLANT
SOL PREP PVP 2OZ (MISCELLANEOUS) IMPLANT
SOLUTION PREP PVP 2OZ (MISCELLANEOUS) ×2 IMPLANT
SPONGE T-LAP 18X18 ~~LOC~~+RFID (SPONGE) ×2 IMPLANT

## 2021-01-22 NOTE — Op Note (Addendum)
ATTENDING Surgeon(s): Carolan Shiver, MD   ANESTHESIA: General   PRE-OPERATIVE DIAGNOSIS: Left lower extremity necrotizing fasciitis   POST-OPERATIVE DIAGNOSIS: Left lower extremity necrotizing fasciitis   PROCEDURE(S): 1.)  Negative pressure dressing DME placement   INTRAOPERATIVE FINDINGS:  Preoperatively there was a 70 cm long x 30 cm wide open wound Postoperatively there was a 70 cm long x 30 cm wide (2100 sq cm) Today, No new area of debridement.         ESTIMATED BLOOD LOSS: 5 mL    SPECIMENS: None   COMPLICATIONS: None apparent   CONDITION AT END OF PROCEDURE: Hemodynamically stable and awake   INDICATIONS FOR PROCEDURE:  Patient is 60 year old female with necrotizing fasciitis. Fourth look indicated to assess if there is progression of infection and to change the wound vac. Beside complex wound vac change not tolerated by patient at this moment.    DETAILS OF PROCEDURE: After informed consent,patient was taken to the OR. Time out performed. General anesthesia induced. Patient placed on supine position. Negative pressure dressing removed. The left lower extremity was cleaned and draped in sterile fashion.  The open wounds were thoroughly irrigated with saline.  Minimal liquefactive necrosis removed with lap pad. Hemostasis was achieved.  Since there was no further infection or obvious ischemic tissue, I personally placed the complex negative pressure dressing.  Patient basically has 2 separate negative pressure dressing system with irrigation.

## 2021-01-22 NOTE — Progress Notes (Signed)
PROGRESS NOTE    Kaylee [name]  ZOX:[id number] DOB: 15-Dec-1960 DOA: 01/15/2021 PCP: [name], MD   Assessment & Plan:   Principal Problem:   Cellulitis of left leg Active Problems:   Chronic pain syndrome   GERD (gastroesophageal reflux disease)   Bipolar 1 disorder (HCC)   COPD (chronic obstructive pulmonary disease) (HCC)   Sepsis (HCC)   History of parasitic infection   Necrotizing fasciitis of LLE: s/p wound debridement on 7/7-7/8 & 01/20/21 as per general surg. Intraop cxs growing MRSA & S. Pyogenes. Continue on IV linezolid as per ID. Will go back to the OR today  Leukocytosis: likely secondary to above infections. Continue on on IV linezolid   Thrombocytosis: likely secondary to above infection. Will continue to monitor   Substance use disorder:  urine drug screen on 01/17/21 was positive for amphetamines, benzos, & opiates. Continue w/ supportive care  Substance-induced mood disorder & bipolar depression: severity unknown. Continue on reduced dose of abilify while on linezolid. Continue to hold home dose of celexa while on linezolid. Given Abilify, ID and Dr. Harl Bowie recommended scheduled diazepam to prevent serotonin syndrome while on linezolid. Continue on diazepam   Hypokalemia: WNL today   COPD: w/o exacerbation. Continue on home bronchodilators   Hyponatremia: trending up today    GERD: continue on PPI   Likely subacute blood loss anemia: likely from nec-fasciitis. Will transfuse if Hb < 7.0   DVT prophylaxis: lovenox  Code Status: full Family Communication:  Disposition Plan: depends on PT/OT recs (not consulted yet)   Level of care: Med-Surg  Status is: Inpatient  Remains inpatient appropriate because:Ongoing diagnostic testing needed not appropriate for outpatient work up, IV treatments appropriate due to intensity of illness or inability to take PO, and Inpatient level of care appropriate due to severity of illness  Dispo: The patient is  from: Home              Anticipated d/c is to: Home              Patient currently is not medically stable to d/c.   Difficult to place patient Yes        Consultants:  General surg  ID  Procedures:   Antimicrobials: linezolid    Subjective: Pt c/o left leg pain   Objective: Vitals:   01/21/21 0755 01/21/21 1109 01/22/21 0440 01/22/21 0736  BP: 101/64 (!) 104/55 (!) 108/59 104/60  Pulse: 74 80 81 89  Resp: Temp: 97.8 F (36.6 C) 98.4 F (36.9 C) 98.5 F (36.9 C) 98.4 F (36.9 C)  TempSrc:  Oral    SpO2: 98% 98% 100% 100%  Weight:      Height:        Intake/Output Summary (Last 24 hours) at 01/22/2021 0812 Last data filed at 01/22/2021 0300 Gross per 24 hour  Intake 660 ml  Output 3300 ml  Net -2640 ml   Filed Weights   01/15/21 1600 01/20/21 0953  Weight: 65.8 kg 65.8 kg    Examination:  General exam: Appears calm but uncomfortable  Respiratory system: Clear to auscultation. Respiratory effort normal. Cardiovascular system: S1 & S2 heard, RRR. No  rubs, gallops or clicks.  Gastrointestinal system: Abdomen is nondistended, soft and nontender.  Normal bowel sounds heard. Central nervous system: Alert and oriented. Moves all extremities  Psychiatry: Judgement and insight appear normal. Mood & affect appropriate.     Data Reviewed: I have personally reviewed following labs and  imaging studies  CBC: Recent Labs  Lab 01/17/21 0701 01/18/21 0510 01/19/21 3154 01/20/21 0609 01/21/21 0437 01/22/21 0508  WBC 41.8* 30.3* 21.5* 26.8* 24.5* 17.1*  NEUTROABS 37.5* 25.5* 17.1* 21.6* 20.0*  --   HGB 10.7* 9.3* 9.3* 9.2* 8.7* 8.5*  HCT 31.0* 26.6* 26.4* 26.6* 25.7* 25.4*  MCV 85.6 86.9 85.2 87.2 88.9 91.7  PLT 360 360 376 377 423* 412*   Basic Metabolic Panel: Recent Labs  Lab 01/17/21 0701 01/18/21 0510 01/19/21 0642 01/20/21 0609 01/21/21 0437 01/22/21 0508  NA 135 134* 130* 132* 131* 133*  K 3.7 3.5 3.6 4.0 4.0 4.4  CL 103 102  99 99 97* 99  CO2 26 28 27 28 29 31   GLUCOSE 168* 155* 86 88 167* 93  BUN 11 13 10 9 10 13   CREATININE 0.51 0.49 0.47 0.43* 0.41* 0.42*  CALCIUM 8.3* 8.1* 7.7* 7.9* 7.7* 7.9*  MG 1.8  --   --  1.8  --   --   PHOS 3.3  --   --  3.4  --   --    GFR: Estimated Creatinine Clearance: 69.1 mL/min (A) (by C-G formula based on SCr of 0.42 mg/dL (L)). Liver Function Tests: Recent Labs  Lab 01/15/21 1605  AST 17  ALT 14  ALKPHOS 114  BILITOT 0.5  PROT 6.4*  ALBUMIN 2.6*   No results for input(s): LIPASE, AMYLASE in the last 168 hours. No results for input(s): AMMONIA in the last 168 hours. Coagulation Profile: Recent Labs  Lab 01/15/21 1849  INR 1.0   Cardiac Enzymes: Recent Labs  Lab 01/18/21 0510  CKTOTAL 18*   BNP (last 3 results) No results for input(s): PROBNP in the last 8760 hours. HbA1C: No results for input(s): HGBA1C in the last 72 hours. CBG: Recent Labs  Lab 01/21/21 0803 01/21/21 1222 01/21/21 1457 01/21/21 1619 01/21/21 2034  GLUCAP 119* 117* 96 97 108*   Lipid Profile: No results for input(s): CHOL, HDL, LDLCALC, TRIG, CHOLHDL, LDLDIRECT in the last 72 hours. Thyroid Function Tests: No results for input(s): TSH, T4TOTAL, FREET4, T3FREE, THYROIDAB in the last 72 hours. Anemia Panel: No results for input(s): VITAMINB12, FOLATE, FERRITIN, TIBC, IRON, RETICCTPCT in the last 72 hours. Sepsis Labs: Recent Labs  Lab 01/15/21 2048 01/16/21 0027 01/17/21 0701 01/18/21 0510  LATICACIDVEN 2.7* 2.8* 1.7 1.4    Recent Results (from the past 240 hour(s))  Resp Panel by RT-PCR (Flu A&B, Covid) Nasopharyngeal Swab     Status: None   Collection Time: 01/15/21  6:49 PM   Specimen: Nasopharyngeal Swab; Nasopharyngeal(NP) swabs in vial transport medium  Result Value Ref Range Status   SARS Coronavirus 2 by RT PCR NEGATIVE NEGATIVE Final    Comment: (NOTE) SARS-CoV-2 target nucleic acids are NOT DETECTED.  The SARS-CoV-2 RNA is generally detectable in upper  respiratory specimens during the acute phase of infection. The lowest concentration of SARS-CoV-2 viral copies this assay can detect is 138 copies/mL. A negative result does not preclude SARS-Cov-2 infection and should not be used as the sole basis for treatment or other patient management decisions. A negative result may occur with  improper specimen collection/handling, submission of specimen other than nasopharyngeal swab, presence of viral mutation(s) within the areas targeted by this assay, and inadequate number of viral copies(<138 copies/mL). A negative result must be combined with clinical observations, patient history, and epidemiological information. The expected result is Negative.  Fact Sheet for Patients:  03/21/21  Fact Sheet for Healthcare Providers:  SeriousBroker.it  This test is no t yet approved or cleared by the [place] and  has been authorized for detection and/or diagnosis of SARS-CoV-2 by FDA under an Emergency Use Authorization (EUA). This EUA will remain  in effect (meaning this test can be used) for the duration of the COVID-19 declaration under Section 564(b)(1) of the Act, 21 U.S.C.section 360bbb-3(b)(1), unless the authorization is terminated  or revoked sooner.       Influenza A by PCR NEGATIVE NEGATIVE Final   Influenza B by PCR NEGATIVE NEGATIVE Final    Comment: (NOTE) The Xpert Xpress SARS-CoV-2/FLU/RSV plus assay is intended as an aid in the diagnosis of influenza from Nasopharyngeal swab specimens and should not be used as a sole basis for treatment. Nasal washings and aspirates are unacceptable for Xpert Xpress SARS-CoV-2/FLU/RSV testing.  Fact Sheet for Patients: BloggerCourse.com  Fact Sheet for Healthcare Providers: SeriousBroker.it  This test is not yet approved or cleared by the [place] FDA and has been  authorized for detection and/or diagnosis of SARS-CoV-2 by FDA under an Emergency Use Authorization (EUA). This EUA will remain in effect (meaning this test can be used) for the duration of the COVID-19 declaration under Section 564(b)(1) of the Act, 21 U.S.C. section 360bbb-3(b)(1), unless the authorization is terminated or revoked.  Performed at [facility], [address] [place], [place] [address]   Blood Culture (routine x 2)     Status: None   Collection Time: 01/15/21  6:49 PM   Specimen: BLOOD  Result Value Ref Range Status   Specimen Description BLOOD RIGHT ANTECUBITAL  Final   Special Requests   Final    BOTTLES DRAWN AEROBIC AND ANAEROBIC Blood Culture results may not be optimal due to an inadequate volume of blood received in culture bottles   Culture   Final    NO GROWTH 5 DAYS Performed at [facility], [address] [place], [place] [address]    Report Status 01/20/2021 FINAL  Final  Blood Culture (routine x 2)     Status: None   Collection Time: 01/15/21  6:49 PM   Specimen: BLOOD  Result Value Ref Range Status   Specimen Description BLOOD BLOOD RIGHT HAND  Final   Special Requests   Final    BOTTLES DRAWN AEROBIC AND ANAEROBIC Blood Culture results may not be optimal due to an inadequate volume of blood received in culture bottles   Culture   Final    NO GROWTH 5 DAYS Performed at [facility], [address], [place], [place] [address]    Report Status 01/20/2021 FINAL  Final  Urine culture     Status: Abnormal   Collection Time: 01/15/21  8:48 PM   Specimen: In/Out Cath Urine  Result Value Ref Range Status   Specimen Description   Final    IN/OUT CATH URINE Performed at [facility], [address] [place], [place] [address]    Special Requests   Final    NONE Performed at [facility], [address] [place], [place] [address]    Culture (A)  Final    <10,000 COLONIES/mL INSIGNIFICANT  GROWTH Performed at [facility], [address], [place], [place] [address]    Report Status 01/17/2021 FINAL  Final  Aerobic Culture w Gram Stain (superficial specimen)     Status: None   Collection Time: 01/16/21  3:40 PM   Specimen: Wound  Result Value Ref Range Status  Specimen Description   Final    WOUND Performed at [facility], [address] [place], [place] [address]    Special Requests   Final    NONE Performed at [facility], [address] [place], [place] [address]    Gram Stain NO WBC SEEN MODERATE GRAM POSITIVE COCCI   Final   Culture   Final    MODERATE METHICILLIN RESISTANT STAPHYLOCOCCUS AUREUS MODERATE STREPTOCOCCUS PYOGENES Beta hemolytic streptococci are predictably susceptible to penicillin and other beta lactams. Susceptibility testing not routinely performed. Performed at [facility], [address] [place], [place] [address]    Report Status 01/19/2021 FINAL  Final   Organism ID, Bacteria METHICILLIN RESISTANT STAPHYLOCOCCUS AUREUS  Final      Susceptibility   Methicillin resistant staphylococcus aureus - MIC*    CIPROFLOXACIN >=8 RESISTANT Resistant     ERYTHROMYCIN >=8 RESISTANT Resistant     GENTAMICIN <=0.5 SENSITIVE Sensitive     OXACILLIN >=4 RESISTANT Resistant     TETRACYCLINE <=1 SENSITIVE Sensitive     VANCOMYCIN <=0.5 SENSITIVE Sensitive     TRIMETH/SULFA 80 RESISTANT Resistant     CLINDAMYCIN <=0.25 SENSITIVE Sensitive     RIFAMPIN <=0.5 SENSITIVE Sensitive     Inducible Clindamycin NEGATIVE Sensitive     * MODERATE METHICILLIN RESISTANT STAPHYLOCOCCUS AUREUS         Radiology Studies: No results found.      Scheduled Meds:  ARIPiprazole  5 mg Oral Daily   Chlorhexidine Gluconate Cloth  6 each Topical Daily   Chlorhexidine Gluconate Cloth  6 each Topical Q0600   diazepam  5 mg Oral Q6H   enoxaparin (LOVENOX) injection  40 mg Subcutaneous Q24H   insulin aspart  0-5 Units  Subcutaneous QHS   insulin aspart  0-9 Units Subcutaneous TID WC   linaclotide  290 mcg Oral QAC breakfast   pantoprazole  20 mg Oral Daily   polyethylene glycol  17 g Oral Daily   senna-docusate  2 tablet Oral Daily   Continuous Infusions:  sodium chloride     linezolid (ZYVOX) IV 600 mg (01/21/21 2248)     LOS: 7 days    Time spent:34 mins     Charise Killian, MD [facility] Pager [phone]  If 7PM-7AM, please contact night-coverage  01/22/2021, 8:12 AM

## 2021-01-22 NOTE — Anesthesia Procedure Notes (Signed)
Procedure Name: LMA Insertion Date/Time: 01/22/2021 2:58 PM Performed by: Henrietta Hoover, CRNA Pre-anesthesia Checklist: Emergency Drugs available, Patient identified, Suction available and Patient being monitored Patient Re-evaluated:Patient Re-evaluated prior to induction Oxygen Delivery Method: Circle system utilized Preoxygenation: Pre-oxygenation with 100% oxygen Induction Type: IV induction Ventilation: Mask ventilation without difficulty LMA: LMA inserted LMA Size: 4.0 Number of attempts: 1 Placement Confirmation: positive ETCO2 and breath sounds checked- equal and bilateral Tube secured with: Tape Dental Injury: Teeth and Oropharynx as per pre-operative assessment
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
## 2021-01-22 NOTE — Plan of Care (Signed)

## 2021-01-22 NOTE — Plan of Care (Signed)
No acute events overnight. Problem: Education: Goal: Knowledge of General Education information will improve Description: Including pain rating scale, medication(s)/side effects and non-pharmacologic comfort measures Outcome: Progressing   Problem: Health Behavior/Discharge Planning: Goal: Ability to manage health-related needs will improve Outcome: Progressing   Problem: Clinical Measurements: Goal: Ability to maintain clinical measurements within normal limits will improve Outcome: Progressing Goal: Will remain free from infection Outcome: Progressing Goal: Diagnostic test results will improve Outcome: Progressing Goal: Respiratory complications will improve Outcome: Progressing Goal: Cardiovascular complication will be avoided Outcome: Progressing   Problem: Activity: Goal: Risk for activity intolerance will decrease Outcome: Progressing   Problem: Nutrition: Goal: Adequate nutrition will be maintained Outcome: Progressing   Problem: Coping: Goal: Level of anxiety will decrease Outcome: Progressing   Problem: Elimination: Goal: Will not experience complications related to bowel motility Outcome: Progressing Goal: Will not experience complications related to urinary retention Outcome: Progressing   Problem: Pain Managment: Goal: General experience of comfort will improve Outcome: Progressing   Problem: Safety: Goal: Ability to remain free from injury will improve Outcome: Progressing   Problem: Skin Integrity: Goal: Risk for impaired skin integrity will decrease Outcome: Progressing   Problem: Skin Integrity: Goal: Skin integrity will improve Outcome: Progressing

## 2021-01-22 NOTE — Anesthesia Postprocedure Evaluation (Signed)
Anesthesia Post Note  Patient: Kaylee [name]  Procedure(s) Performed: INCISION AND DRAINAGE ABSCESS (Left) APPLICATION OF WOUND VAC (Left)  Patient location during evaluation: PACU Anesthesia Type: General Level of consciousness: awake and alert Pain management: pain level controlled Vital Signs Assessment: post-procedure vital signs reviewed and stable Respiratory status: spontaneous breathing, nonlabored ventilation, respiratory function stable and patient connected to nasal cannula oxygen Cardiovascular status: blood pressure returned to baseline and stable Postop Assessment: no apparent nausea or vomiting Anesthetic complications: no   No notable events documented.   Last Vitals:  Vitals:   01/22/21 1645 01/22/21 1708  BP: 126/64 118/62  Pulse: 88 89  Resp: 14   Temp: 36.6 C 36.8 C  SpO2: 98% 99%    Last Pain:  Vitals:   01/22/21 1630  TempSrc:   PainSc: 5                  Corinda Gubler

## 2021-01-22 NOTE — Progress Notes (Signed)
   Date of Admission:  01/15/2021     ID: Kaylee [name] is a 60 y.o. female  Principal Problem:   Cellulitis of left leg Active Problems:   Chronic pain syndrome   GERD (gastroesophageal reflux disease)   Bipolar 1 disorder (HCC)   COPD (chronic obstructive pulmonary disease) (HCC)   Sepsis (HCC)   History of parasitic infection    Subjective: Looking better Had vac change under anesthesia today  Medications:   ARIPiprazole  5 mg Oral Daily   Chlorhexidine Gluconate Cloth  6 each Topical Daily   Chlorhexidine Gluconate Cloth  6 each Topical Q0600   diazepam  5 mg Oral Q6H   enoxaparin (LOVENOX) injection  40 mg Subcutaneous Q24H   insulin aspart  0-5 Units Subcutaneous QHS   insulin aspart  0-9 Units Subcutaneous TID WC   linaclotide  290 mcg Oral QAC breakfast   pantoprazole  20 mg Oral Daily   polyethylene glycol  17 g Oral Daily   senna-docusate  2 tablet Oral Daily    Objective: Vital signs in last 24 hours: Temp:  [98.4 F (36.9 C)-98.5 F (36.9 C)] 98.4 F (36.9 C) (07/13 0736) Pulse Rate:  [80-89] 89 (07/13 0736) Resp:  [17] 17 (07/13 0736) BP: (104-108)/(55-60) 104/60 (07/13 0736) SpO2:  [98 %-100 %] 100 % (07/13 0736)  PHYSICAL EXAM:  General: Alert, cooperative, no distress, appears stated age.  Head: Normocephalic, without obvious abnormality, atraumatic. Eyes: Conjunctivae clear, anicteric sclerae. Pupils are equal ENT Nares normal. No drainage or sinus tenderness. Lips, mucosa, and tongue normal. No Thrush Neck: Supple, symmetrical, no adenopathy, thyroid: non tender no carotid bruit and no JVD. Back: No CVA tenderness. Lungs: Clear to auscultation bilaterally. No Wheezing or Rhonchi. No rales. Heart: Regular rate and rhythm, no murmur, rub or gallop. Abdomen: Soft, non-tender,not distended. Bowel sounds normal. No masses Extremities:       Skin: No rashes or lesions. Or bruising Lymph: Cervical, supraclavicular normal. Neurologic:  Grossly non-focal  Lab Results Recent Labs    01/21/21 0437 01/22/21 0508  WBC 24.5* 17.1*  HGB 8.7* 8.5*  HCT 25.7* 25.4*  NA 131* 133*  K 4.0 4.4  CL 97* 99  CO2 29 31  BUN 10 13  CREATININE 0.41* 0.42*    Microbiology: MRSA and strep pyogenes in culture Studies/Results: No results found.   Assessment/Plan: Necrotizing fasciitis of the left leg due to Streptococcus pyogenes and MRSA s/p extensive debridement . Looking better  Currently on linezolid.  Day 3 and is doing well.  Leukocytosis has improved significantly.  Bipolar disorder.  She is currently on Abilify.  Celexa has been stopped because of linezolid.  She is also on round-the-clock benzo.  This is to prevent her from getting any agitation and serotonin syndrome.  I think that can be reduced as per the protocol  Parasitophobia-  Polysubstance use  Discussed the management with the patient and the care team.

## 2021-01-22 NOTE — Transfer of Care (Signed)
Immediate Anesthesia Transfer of Care Note  Patient: Kaylee [name]  Procedure(s) Performed: INCISION AND DRAINAGE ABSCESS (Left) APPLICATION OF WOUND VAC (Left)  Patient Location: PACU  Anesthesia Type:General  Level of Consciousness: awake, drowsy and patient cooperative  Airway & Oxygen Therapy: Patient Spontanous Breathing and Patient connected to face mask oxygen  Post-op Assessment: Report given to RN and Post -op Vital signs reviewed and stable  Post vital signs: Reviewed and stable  Last Vitals:  Vitals Value Taken Time  BP 120/62 01/22/21 1603  Temp    Pulse 88 01/22/21 1609  Resp 15 01/22/21 1609  SpO2 100 % 01/22/21 1609  Vitals shown include unvalidated device data.  Last Pain:  Vitals:   01/22/21 1328  TempSrc: Oral  PainSc: 8       Patients Stated Pain Goal: 4 (01/22/21 1142)  Complications: No notable events documented.

## 2021-01-23 ENCOUNTER — Encounter: Payer: Self-pay | Admitting: General Surgery

## 2021-01-23 DIAGNOSIS — D75839 Thrombocytosis, unspecified: Secondary | ICD-10-CM | POA: Diagnosis not present

## 2021-01-23 DIAGNOSIS — D72828 Other elevated white blood cell count: Secondary | ICD-10-CM | POA: Diagnosis not present

## 2021-01-23 DIAGNOSIS — M726 Necrotizing fasciitis: Secondary | ICD-10-CM | POA: Diagnosis not present

## 2021-01-23 LAB — CBC WITH DIFFERENTIAL/PLATELET
Abs Immature Granulocytes: 0.16 10*3/uL — ABNORMAL HIGH (ref 0.00–0.07)
Basophils Absolute: 0 10*3/uL (ref 0.0–0.1)
Basophils Relative: 0 %
Eosinophils Absolute: 0.1 10*3/uL (ref 0.0–0.5)
Eosinophils Relative: 0 %
HCT: 26.4 % — ABNORMAL LOW (ref 36.0–46.0)
Hemoglobin: 8.5 g/dL — ABNORMAL LOW (ref 12.0–15.0)
Immature Granulocytes: 1 %
Lymphocytes Relative: 19 %
Lymphs Abs: 2.5 10*3/uL (ref 0.7–4.0)
MCH: 29.3 pg (ref 26.0–34.0)
MCHC: 32.2 g/dL (ref 30.0–36.0)
MCV: 91 fL (ref 80.0–100.0)
Monocytes Absolute: 0.6 10*3/uL (ref 0.1–1.0)
Monocytes Relative: 5 %
Neutro Abs: 10.1 10*3/uL — ABNORMAL HIGH (ref 1.7–7.7)
Neutrophils Relative %: 75 %
Platelets: 509 10*3/uL — ABNORMAL HIGH (ref 150–400)
RBC: 2.9 MIL/uL — ABNORMAL LOW (ref 3.87–5.11)
RDW: 15.2 % (ref 11.5–15.5)
WBC: 13.5 10*3/uL — ABNORMAL HIGH (ref 4.0–10.5)
nRBC: 0 % (ref 0.0–0.2)

## 2021-01-23 LAB — BASIC METABOLIC PANEL
Anion gap: 7 (ref 5–15)
BUN: 7 mg/dL — ABNORMAL LOW (ref 8–23)
CO2: 30 mmol/L (ref 22–32)
Calcium: 8.1 mg/dL — ABNORMAL LOW (ref 8.9–10.3)
Chloride: 98 mmol/L (ref 98–111)
Creatinine, Ser: 0.4 mg/dL — ABNORMAL LOW (ref 0.44–1.00)
GFR, Estimated: >60 mL/min (ref >60)
Glucose, Bld: 105 mg/dL — ABNORMAL HIGH (ref 70–99)
Potassium: 3.8 mmol/L (ref 3.5–5.1)
Sodium: 135 mmol/L (ref 135–145)

## 2021-01-23 LAB — GLUCOSE, CAPILLARY
Glucose-Capillary: 99 mg/dL (ref 70–99)
Glucose-Capillary: 114 mg/dL — ABNORMAL HIGH (ref 70–99)
Glucose-Capillary: 121 mg/dL — ABNORMAL HIGH (ref 70–99)
Glucose-Capillary: 110 mg/dL — ABNORMAL HIGH (ref 70–99)

## 2021-01-23 MED ORDER — BLISTEX MEDICATED EX OINT
TOPICAL_OINTMENT | CUTANEOUS | Status: DC | PRN
  Filled 2021-01-23 (×2): qty 6.3

## 2021-01-23 NOTE — Progress Notes (Signed)
ID Had some pain and got meds Feeling better now No fever  Patient Vitals for the past 24 hrs:  BP Temp Temp src Pulse Resp SpO2  01/23/21 1228 (!) 106/57 -- -- 84 16 98 %  01/23/21 0716 (!) 109/59 98.1 F (36.7 C) -- 79 16 99 %  01/23/21 0417 97/62 98.3 F (36.8 C) Oral 77 18 99 %  01/22/21 2201 126/65 98.2 F (36.8 C) Oral 90 17 96 %  01/22/21 1708 118/62 98.3 F (36.8 C) -- 89 -- 99 %  01/22/21 1645 126/64 97.8 F (36.6 C) -- 88 14 98 %  01/22/21 1630 114/67 -- -- 88 12 100 %  01/22/21 1615 107/63 -- -- 94 15 100 %  01/22/21 1603 -- 98.1 F (36.7 C) -- -- -- --     Awake and alert Chest b/l air entry Hs1s Abd soft Left leg- edema more today than yesterday Wound vac present- it is also flushing fluid into the wound and sucking it out   CBC Latest Ref Rng & Units 01/23/2021 01/22/2021 01/21/2021  WBC 4.0 - 10.5 K/uL 13.5(H) 17.1(H) 24.5(H)  Hemoglobin 12.0 - 15.0 g/dL 9.1(Y) 7.8(G) 9.5(A)  Hematocrit 36.0 - 46.0 % 26.4(L) 25.4(L) 25.7(L)  Platelets 150 - 400 K/uL 509(H) 412(H) 423(H)     CMP Latest Ref Rng & Units 01/23/2021 01/22/2021 01/21/2021  Glucose 70 - 99 mg/dL 213(Y) 93 865(H)  BUN 8 - 23 mg/dL 7(L) 13 10  Creatinine 0.44 - 1.00 mg/dL 8.46(N) 6.29(B) 2.84(X)  Sodium 135 - 145 mmol/L 135 133(L) 131(L)  Potassium 3.5 - 5.1 mmol/L 3.8 4.4 4.0  Chloride 98 - 111 mmol/L 98 99 97(L)  CO2 22 - 32 mmol/L 30 31 29   Calcium 8.9 - 10.3 mg/dL 8.1(L) 7.9(L) 7.7(L)  Total Protein 6.5 - 8.1 g/dL - - -  Total Bilirubin 0.3 - 1.2 mg/dL - - -  Alkaline Phos 38 - 126 U/L - - -  AST 15 - 41 U/L - - -  ALT 0 - 44 U/L - - -     Impression/recommendation Necrotizing fasciitis of the left leg due to Streptococcus pyogenes and MRSA.  Status post extensive debridement.  Has a wound VAC.  Today the edema is more than yesterday Currently on linezolid day 4.   Leukocytosis has improved significantly and it is 13 now.  Bipolar disorder. She is currently on Abilify.  Celexa has been  stopped because of linezolid.  She is also on round-the-clock benzo.  This should be reduced as per the protocol. No evidence of serotonin syndrome  Paracentral phobia  Polysubstance use.  Discussed the management with the patient

## 2021-01-23 NOTE — Plan of Care (Signed)

## 2021-01-23 NOTE — Progress Notes (Signed)
PROGRESS NOTE    Kaylee [name]  WUJ:[id number] DOB: 1961/03/10 DOA: 01/15/2021 PCP: [name], MD   Assessment & Plan:   Principal Problem:   Cellulitis of left leg Active Problems:   Chronic pain syndrome   GERD (gastroesophageal reflux disease)   Bipolar 1 disorder (HCC)   COPD (chronic obstructive pulmonary disease) (HCC)   Sepsis (HCC)   History of parasitic infection   Necrotizing fasciitis of LLE: s/p wound debridement on 7/7-7/8 & 01/20/21, 01/22/21 as per general surg. Intraop cxs growing MRSA & S. Pyogenes. Continue on IV linezolid as per ID.  Leukocytosis: likely secondary to above infection. Continue on IV abxs   Thrombocytosis: likely secondary to above infection. Continue on IV abxs   Substance use disorder: urine drug screen on 01/17/21 was positive for amphetamines, benzos, & opiates. Continue w/ supportive care  Substance-induced mood disorder & bipolar depression: severity unknown. Continue on reduced dose of abilify while on linezolid. Continue to hold home dose of celexa while on linezolid. Given Abilify, ID and Dr. Harl Bowie recommended scheduled diazepam to prevent serotonin syndrome while on linezolid. Continue on diazepam   Hypokalemia: within normal limits today   COPD: w/o exacerbation. Continue on home bronchodilators   Hyponatremia: resolved    GERD: continue on PPI    Likely subacute blood loss anemia: likely from nec-fasciitis. Will transfuse if Hb < 7.0   DVT prophylaxis: lovenox  Code Status: full Family Communication:  Disposition Plan: depends on PT/OT recs (not consulted yet)   Level of care: Med-Surg  Status is: Inpatient  Remains inpatient appropriate because:Ongoing diagnostic testing needed not appropriate for outpatient work up, IV treatments appropriate due to intensity of illness or inability to take PO, and Inpatient level of care appropriate due to severity of illness  Dispo: The patient is from: Home               Anticipated d/c is to: Home              Patient currently is not medically stable to d/c.   Difficult to place patient Yes        Consultants:  General surg  ID  Procedures:   Antimicrobials: linezolid    Subjective: Pt c/o left leg pain   Objective: Vitals:   01/22/21 2201 01/23/21 0417 01/23/21 0716 01/23/21 1228  BP: 126/65 97/62 (!) 109/59 (!) 106/57  Pulse: 90 77 79 84  Resp: Temp: 98.2 F (36.8 C) 98.3 F (36.8 C) 98.1 F (36.7 C)   TempSrc: Oral Oral    SpO2: 96% 99% 99% 98%  Weight:      Height:        Intake/Output Summary (Last 24 hours) at 01/23/2021 1401 Last data filed at 01/23/2021 0930 Gross per 24 hour  Intake 1305 ml  Output 4180 ml  Net -2875 ml   Filed Weights   01/15/21 1600 01/20/21 0953  Weight: 65.8 kg 65.8 kg    Examination:  General exam: Appears uncomfortable  Respiratory system: Clear breath sounds b/l. No rales, wheezes Cardiovascular system: S1/S2+. No rubs or clicks   Gastrointestinal system: Abd is soft, NT, ND & normal bowel sounds  Central nervous system: Alert and oriented. Moves all extremities  Psychiatry: Judgement and insight appear normal. Appropriate mood and affect    Data Reviewed: I have personally reviewed following labs and imaging studies  CBC: Recent Labs  Lab 01/18/21 0510 01/19/21 9562 01/20/21 1308 01/21/21 0437 01/22/21 6578  01/23/21 0656  WBC 30.3* 21.5* 26.8* 24.5* 17.1* 13.5*  NEUTROABS 25.5* 17.1* 21.6* 20.0*  --  10.1*  HGB 9.3* 9.3* 9.2* 8.7* 8.5* 8.5*  HCT 26.6* 26.4* 26.6* 25.7* 25.4* 26.4*  MCV 86.9 85.2 87.2 88.9 91.7 91.0  PLT 360 376 377 423* 412* 509*   Basic Metabolic Panel: Recent Labs  Lab 01/17/21 0701 01/18/21 0510 01/19/21 0642 01/20/21 0609 01/21/21 0437 01/22/21 0508 01/23/21 0656  NA 135   < > 130* 132* 131* 133* 135  K 3.7   < > 3.6 4.0 4.0 4.4 3.8  CL 103   < > 99 99 97* 99 98  CO2 26   < > 27 28 29 31 30   GLUCOSE 168*   < > 86 88 167* 93  105*  BUN 11   < > 10 9 10 13  7*  CREATININE 0.51   < > 0.47 0.43* 0.41* 0.42* 0.40*  CALCIUM 8.3*   < > 7.7* 7.9* 7.7* 7.9* 8.1*  MG 1.8  --   --  1.8  --   --   --   PHOS 3.3  --   --  3.4  --   --   --    < > = values in this interval not displayed.   GFR: Estimated Creatinine Clearance: 69.1 mL/min (A) (by C-G formula based on SCr of 0.4 mg/dL (L)). Liver Function Tests: No results for input(s): AST, ALT, ALKPHOS, BILITOT, PROT, ALBUMIN in the last 168 hours.  No results for input(s): LIPASE, AMYLASE in the last 168 hours. No results for input(s): AMMONIA in the last 168 hours. Coagulation Profile: No results for input(s): INR, PROTIME in the last 168 hours.  Cardiac Enzymes: Recent Labs  Lab 01/18/21 0510  CKTOTAL 18*   BNP (last 3 results) No results for input(s): PROBNP in the last 8760 hours. HbA1C: No results for input(s): HGBA1C in the last 72 hours. CBG: Recent Labs  Lab 01/22/21 1619 01/22/21 1710 01/22/21 2159 01/23/21 0718 01/23/21 1225  GLUCAP 81 118* 124* 99 114*   Lipid Profile: No results for input(s): CHOL, HDL, LDLCALC, TRIG, CHOLHDL, LDLDIRECT in the last 72 hours. Thyroid Function Tests: No results for input(s): TSH, T4TOTAL, FREET4, T3FREE, THYROIDAB in the last 72 hours. Anemia Panel: No results for input(s): VITAMINB12, FOLATE, FERRITIN, TIBC, IRON, RETICCTPCT in the last 72 hours. Sepsis Labs: Recent Labs  Lab 01/17/21 0701 01/18/21 0510  LATICACIDVEN 1.7 1.4    Recent Results (from the past 240 hour(s))  Resp Panel by RT-PCR (Flu A&B, Covid) Nasopharyngeal Swab     Status: None   Collection Time: 01/15/21  6:49 PM   Specimen: Nasopharyngeal Swab; Nasopharyngeal(NP) swabs in vial transport medium  Result Value Ref Range Status   SARS Coronavirus 2 by RT PCR NEGATIVE NEGATIVE Final    Comment: (NOTE) SARS-CoV-2 target nucleic acids are NOT DETECTED.  The SARS-CoV-2 RNA is generally detectable in upper respiratory specimens during  the acute phase of infection. The lowest concentration of SARS-CoV-2 viral copies this assay can detect is 138 copies/mL. A negative result does not preclude SARS-Cov-2 infection and should not be used as the sole basis for treatment or other patient management decisions. A negative result may occur with  improper specimen collection/handling, submission of specimen other than nasopharyngeal swab, presence of viral mutation(s) within the areas targeted by this assay, and inadequate number of viral copies(<138 copies/mL). A negative result must be combined with clinical observations, patient history, and epidemiological information.  The expected result is Negative.  Fact Sheet for Patients:  BloggerCourse.com  Fact Sheet for Healthcare Providers:  SeriousBroker.it  This test is no t yet approved or cleared by the [place] FDA and  has been authorized for detection and/or diagnosis of SARS-CoV-2 by FDA under an Emergency Use Authorization (EUA). This EUA will remain  in effect (meaning this test can be used) for the duration of the COVID-19 declaration under Section 564(b)(1) of the Act, 21 U.S.C.section 360bbb-3(b)(1), unless the authorization is terminated  or revoked sooner.       Influenza A by PCR NEGATIVE NEGATIVE Final   Influenza B by PCR NEGATIVE NEGATIVE Final    Comment: (NOTE) The Xpert Xpress SARS-CoV-2/FLU/RSV plus assay is intended as an aid in the diagnosis of influenza from Nasopharyngeal swab specimens and should not be used as a sole basis for treatment. Nasal washings and aspirates are unacceptable for Xpert Xpress SARS-CoV-2/FLU/RSV testing.  Fact Sheet for Patients: BloggerCourse.com  Fact Sheet for Healthcare Providers: SeriousBroker.it  This test is not yet approved or cleared by the [place] FDA and has been authorized for detection and/or  diagnosis of SARS-CoV-2 by FDA under an Emergency Use Authorization (EUA). This EUA will remain in effect (meaning this test can be used) for the duration of the COVID-19 declaration under Section 564(b)(1) of the Act, 21 U.S.C. section 360bbb-3(b)(1), unless the authorization is terminated or revoked.  Performed at [facility], [address] [place], [place] [address]   Blood Culture (routine x 2)     Status: None   Collection Time: 01/15/21  6:49 PM   Specimen: BLOOD  Result Value Ref Range Status   Specimen Description BLOOD RIGHT ANTECUBITAL  Final   Special Requests   Final    BOTTLES DRAWN AEROBIC AND ANAEROBIC Blood Culture results may not be optimal due to an inadequate volume of blood received in culture bottles   Culture   Final    NO GROWTH 5 DAYS Performed at [facility], [address], [place], [place] [address]    Report Status 01/20/2021 FINAL  Final  Blood Culture (routine x 2)     Status: None   Collection Time: 01/15/21  6:49 PM   Specimen: BLOOD  Result Value Ref Range Status   Specimen Description BLOOD BLOOD RIGHT HAND  Final   Special Requests   Final    BOTTLES DRAWN AEROBIC AND ANAEROBIC Blood Culture results may not be optimal due to an inadequate volume of blood received in culture bottles   Culture   Final    NO GROWTH 5 DAYS Performed at [facility], [address], [place], [place] [address]    Report Status 01/20/2021 FINAL  Final  Urine culture     Status: Abnormal   Collection Time: 01/15/21  8:48 PM   Specimen: In/Out Cath Urine  Result Value Ref Range Status   Specimen Description   Final    IN/OUT CATH URINE Performed at [facility], [address], [place], [place] [address]    Special Requests   Final    NONE Performed at [facility], [address], [place], [place] [address]    Culture (A)  Final    <10,000 COLONIES/mL INSIGNIFICANT GROWTH Performed at [facility], [address], [place], [place] [address]    Report Status 01/17/2021 FINAL  Final  Aerobic Culture w Gram Stain (superficial specimen)     Status: None  Collection Time: 01/16/21  3:40 PM   Specimen: Wound  Result Value Ref Range Status   Specimen Description   Final    WOUND Performed at [facility], [address], [place], [place] [address]    Special Requests   Final    NONE Performed at [facility], [address] [place], [place] [address]    Gram Stain NO WBC SEEN MODERATE GRAM POSITIVE COCCI   Final   Culture   Final    MODERATE METHICILLIN RESISTANT STAPHYLOCOCCUS AUREUS MODERATE STREPTOCOCCUS PYOGENES Beta hemolytic streptococci are predictably susceptible to penicillin and other beta lactams. Susceptibility testing not routinely performed. Performed at [facility], [address] [place], [place] [address]    Report Status 01/19/2021 FINAL  Final   Organism ID, Bacteria METHICILLIN RESISTANT STAPHYLOCOCCUS AUREUS  Final      Susceptibility   Methicillin resistant staphylococcus aureus - MIC*    CIPROFLOXACIN >=8 RESISTANT Resistant     ERYTHROMYCIN >=8 RESISTANT Resistant     GENTAMICIN <=0.5 SENSITIVE Sensitive     OXACILLIN >=4 RESISTANT Resistant     TETRACYCLINE <=1 SENSITIVE Sensitive     VANCOMYCIN <=0.5 SENSITIVE Sensitive     TRIMETH/SULFA 80 RESISTANT Resistant     CLINDAMYCIN <=0.25 SENSITIVE Sensitive     RIFAMPIN <=0.5 SENSITIVE Sensitive     Inducible Clindamycin NEGATIVE Sensitive     * MODERATE METHICILLIN RESISTANT STAPHYLOCOCCUS AUREUS         Radiology Studies: No results found.      Scheduled Meds:  ARIPiprazole  5 mg Oral Daily   Chlorhexidine Gluconate Cloth  6 each Topical Daily   Chlorhexidine Gluconate Cloth  6 each Topical Q0600   diazepam  5 mg Oral Q6H   enoxaparin (LOVENOX) injection  40 mg Subcutaneous Q24H   insulin aspart  0-5 Units Subcutaneous QHS   insulin aspart  0-9 Units  Subcutaneous TID WC   linaclotide  290 mcg Oral QAC breakfast   pantoprazole  20 mg Oral Daily   polyethylene glycol  17 g Oral Daily   senna-docusate  2 tablet Oral Daily   Continuous Infusions:  sodium chloride 10 mL/hr at 01/22/21 2212   linezolid (ZYVOX) IV 600 mg (01/23/21 0932)     LOS: 8 days    Time spent: 32 mins     Charise Killian, MD [facility] Pager [phone]  If 7PM-7AM, please contact night-coverage  01/23/2021, 2:01 PM

## 2021-01-23 NOTE — H&P (View-Only) (Signed)
Patient ID: Kaylee [name], female   DOB: 04-Apr-1961, 60 y.o.   MRN: [id number]     SURGICAL PROGRESS NOTE   Hospital Day(s): 8.   Interval History: Patient seen and examined, no acute events or new complaints overnight. Patient reports slowly feeling better she still have pain on the left lower extremity.  Pain does not radiate to the part of the body.  Patient very appreciative with the care that she is receiving.  Vital signs in last 24 hours: [min-max] current  Temp:  [97.8 F (36.6 C)-99.3 F (37.4 C)] 98.1 F (36.7 C) (07/14 0716) Pulse Rate:  [77-94] 84 (07/14 1228) Resp:  [12-18] 16 (07/14 1228) BP: (97-126)/(53-67) 106/57 (07/14 1228) SpO2:  [96 %-100 %] 98 % (07/14 1228)     Height: 5\' 6"  (167.6 cm) Weight: 65.8 kg BMI (Calculated): 23.42   Physical Exam:  Constitutional: alert, cooperative and no distress  Respiratory: breathing non-labored at rest  Cardiovascular: regular rate and sinus rhythm  Extremity: Right lower extremity with negative pressure dressing in place.  No issues with negative pressure dressing.  No worsening ischemic tissue.  Labs:  CBC Latest Ref Rng & Units 01/23/2021 01/22/2021 01/21/2021  WBC 4.0 - 10.5 K/uL 13.5(H) 17.1(H) 24.5(H)  Hemoglobin 12.0 - 15.0 g/dL 03/24/2021) 5.6(L) 8.7(F)  Hematocrit 36.0 - 46.0 % 26.4(L) 25.4(L) 25.7(L)  Platelets 150 - 400 K/uL 509(H) 412(H) 423(H)   CMP Latest Ref Rng & Units 01/23/2021 01/22/2021 01/21/2021  Glucose 70 - 99 mg/dL 03/24/2021) 93 329(J)  BUN 8 - 23 mg/dL 7(L) 13 10  Creatinine 0.44 - 1.00 mg/dL 188(C) 1.66(A) 6.30(Z)  Sodium 135 - 145 mmol/L 135 133(L) 131(L)  Potassium 3.5 - 5.1 mmol/L 3.8 4.4 4.0  Chloride 98 - 111 mmol/L 98 99 97(L)  CO2 22 - 32 mmol/L 30 31 29   Calcium 8.9 - 10.3 mg/dL 8.1(L) 7.9(L) 7.7(L)  Total Protein 6.5 - 8.1 g/dL - - -  Total Bilirubin 0.3 - 1.2 mg/dL - - -  Alkaline Phos 38 - 126 U/L - - -  AST 15 - 41 U/L - - -  ALT 0 - 44 U/L - - -    Imaging studies: No new pertinent  imaging studies   Assessment/Plan:  60 y.o. female with necrotizing fasciitis of the left lower extremity 7 Day Post-Op s/p extensive cleansing and debridement, complicated by pertinent comorbidities including COPD, bipolar type I.  Patient continued to show improvement.  White blood cell continue trending down.  No issues with negative pressure dressing.  No obvious sign of worsening ischemia.  I will take her back to the operating room tomorrow for exchange of negative pressure dressing.  This time I will put a regular negative pressure dressing hoping that the regular negative pressure dressing was started dry the tissue more and decrease edema.  Slowly I will like her to transition to see if it is possible to do that negative pressure dressing bedside but at this moment it is too painful and complicated without deep sedation.  Patient oriented about plan and she agreed.  Patient hospitalist and ID for the management of medical comorbidities and antibiotic therapy.  , MD

## 2021-01-23 NOTE — Progress Notes (Signed)
Patient ID: Kaylee [name], female   DOB: 07/31/1960, 60 y.o.   MRN: [id number]     SURGICAL PROGRESS NOTE   Hospital Day(s): 8.   Interval History: Patient seen and examined, no acute events or new complaints overnight. Patient reports slowly feeling better she still have pain on the left lower extremity.  Pain does not radiate to the part of the body.  Patient very appreciative with the care that she is receiving.  Vital signs in last 24 hours: [min-max] current  Temp:  [97.8 F (36.6 C)-99.3 F (37.4 C)] 98.1 F (36.7 C) (07/14 0716) Pulse Rate:  [77-94] 84 (07/14 1228) Resp:  [12-18] 16 (07/14 1228) BP: (97-126)/(53-67) 106/57 (07/14 1228) SpO2:  [96 %-100 %] 98 % (07/14 1228)     Height: 5' 6" (167.6 cm) Weight: 65.8 kg BMI (Calculated): 23.42   Physical Exam:  Constitutional: alert, cooperative and no distress  Respiratory: breathing non-labored at rest  Cardiovascular: regular rate and sinus rhythm  Extremity: Right lower extremity with negative pressure dressing in place.  No issues with negative pressure dressing.  No worsening ischemic tissue.  Labs:  CBC Latest Ref Rng & Units 01/23/2021 01/22/2021 01/21/2021  WBC 4.0 - 10.5 K/uL 13.5(H) 17.1(H) 24.5(H)  Hemoglobin 12.0 - 15.0 g/dL 8.5(L) 8.5(L) 8.7(L)  Hematocrit 36.0 - 46.0 % 26.4(L) 25.4(L) 25.7(L)  Platelets 150 - 400 K/uL 509(H) 412(H) 423(H)   CMP Latest Ref Rng & Units 01/23/2021 01/22/2021 01/21/2021  Glucose 70 - 99 mg/dL 105(H) 93 167(H)  BUN 8 - 23 mg/dL 7(L) 13 10  Creatinine 0.44 - 1.00 mg/dL 0.40(L) 0.42(L) 0.41(L)  Sodium 135 - 145 mmol/L 135 133(L) 131(L)  Potassium 3.5 - 5.1 mmol/L 3.8 4.4 4.0  Chloride 98 - 111 mmol/L 98 99 97(L)  CO2 22 - 32 mmol/L 30 31 29  Calcium 8.9 - 10.3 mg/dL 8.1(L) 7.9(L) 7.7(L)  Total Protein 6.5 - 8.1 g/dL - - -  Total Bilirubin 0.3 - 1.2 mg/dL - - -  Alkaline Phos 38 - 126 U/L - - -  AST 15 - 41 U/L - - -  ALT 0 - 44 U/L - - -    Imaging studies: No new pertinent  imaging studies   Assessment/Plan:  60 y.o. female with necrotizing fasciitis of the left lower extremity 7 Day Post-Op s/p extensive cleansing and debridement, complicated by pertinent comorbidities including COPD, bipolar type I.  Patient continued to show improvement.  White blood cell continue trending down.  No issues with negative pressure dressing.  No obvious sign of worsening ischemia.  I will take her back to the operating room tomorrow for exchange of negative pressure dressing.  This time I will put a regular negative pressure dressing hoping that the regular negative pressure dressing was started dry the tissue more and decrease edema.  Slowly I will like her to transition to see if it is possible to do that negative pressure dressing bedside but at this moment it is too painful and complicated without deep sedation.  Patient oriented about plan and she agreed.  Patient hospitalist and ID for the management of medical comorbidities and antibiotic therapy.  Lyrah Bradt Cintrn-Daz, MD    

## 2021-01-24 ENCOUNTER — Inpatient Hospital Stay: Payer: Medicare Other | Admitting: Certified Registered"

## 2021-01-24 ENCOUNTER — Encounter: Payer: Self-pay | Admitting: Internal Medicine

## 2021-01-24 ENCOUNTER — Encounter
Admission: EM | Disposition: A | Discharge status: Home / Self Care | Payer: Self-pay | Source: Home / Self Care | Attending: Internal Medicine

## 2021-01-24 DIAGNOSIS — D75839 Thrombocytosis, unspecified: Secondary | ICD-10-CM | POA: Diagnosis not present

## 2021-01-24 DIAGNOSIS — D72828 Other elevated white blood cell count: Secondary | ICD-10-CM | POA: Diagnosis not present

## 2021-01-24 DIAGNOSIS — M726 Necrotizing fasciitis: Secondary | ICD-10-CM | POA: Diagnosis not present

## 2021-01-24 HISTORY — PX: APPLICATION OF WOUND VAC: SHX5189

## 2021-01-24 LAB — BASIC METABOLIC PANEL
Anion gap: 5 (ref 5–15)
BUN: 10 mg/dL (ref 8–23)
CO2: 31 mmol/L (ref 22–32)
Calcium: 8.2 mg/dL — ABNORMAL LOW (ref 8.9–10.3)
Chloride: 98 mmol/L (ref 98–111)
Creatinine, Ser: 0.5 mg/dL (ref 0.44–1.00)
GFR, Estimated: >60 mL/min (ref >60)
Glucose, Bld: 95 mg/dL (ref 70–99)
Potassium: 4.4 mmol/L (ref 3.5–5.1)
Sodium: 134 mmol/L — ABNORMAL LOW (ref 135–145)

## 2021-01-24 LAB — CBC WITH DIFFERENTIAL/PLATELET
Abs Immature Granulocytes: 0.15 10*3/uL — ABNORMAL HIGH (ref 0.00–0.07)
Basophils Absolute: 0 10*3/uL (ref 0.0–0.1)
Basophils Relative: 0 %
Eosinophils Absolute: 0.1 10*3/uL (ref 0.0–0.5)
Eosinophils Relative: 1 %
HCT: 27.3 % — ABNORMAL LOW (ref 36.0–46.0)
Hemoglobin: 8.9 g/dL — ABNORMAL LOW (ref 12.0–15.0)
Immature Granulocytes: 1 %
Lymphocytes Relative: 18 %
Lymphs Abs: 3.3 10*3/uL (ref 0.7–4.0)
MCH: 29.6 pg (ref 26.0–34.0)
MCHC: 32.6 g/dL (ref 30.0–36.0)
MCV: 90.7 fL (ref 80.0–100.0)
Monocytes Absolute: 0.9 10*3/uL (ref 0.1–1.0)
Monocytes Relative: 5 %
Neutro Abs: 13.5 10*3/uL — ABNORMAL HIGH (ref 1.7–7.7)
Neutrophils Relative %: 75 %
Platelets: 593 10*3/uL — ABNORMAL HIGH (ref 150–400)
RBC: 3.01 MIL/uL — ABNORMAL LOW (ref 3.87–5.11)
RDW: 15.1 % (ref 11.5–15.5)
WBC: 18 10*3/uL — ABNORMAL HIGH (ref 4.0–10.5)
nRBC: 0 % (ref 0.0–0.2)

## 2021-01-24 SURGERY — APPLICATION, WOUND VAC
Anesthesia: General

## 2021-01-24 MED ORDER — MIDAZOLAM HCL 2 MG/2ML IJ SOLN
INTRAMUSCULAR | Status: AC
  Filled 2021-01-24: qty 2

## 2021-01-24 MED ORDER — CHLORHEXIDINE GLUCONATE 0.12 % MT SOLN
OROMUCOSAL | Status: AC
  Administered 2021-01-24: 15 mL
  Filled 2021-01-24: qty 15

## 2021-01-24 MED ORDER — DEXMEDETOMIDINE (PRECEDEX) IN NS 20 MCG/5ML (4 MCG/ML) IV SYRINGE
PREFILLED_SYRINGE | INTRAVENOUS | Status: AC
  Filled 2021-01-24: qty 5

## 2021-01-24 MED ORDER — FENTANYL CITRATE (PF) 100 MCG/2ML IJ SOLN
INTRAMUSCULAR | Status: AC
  Filled 2021-01-24: qty 2

## 2021-01-24 MED ORDER — 0.9 % SODIUM CHLORIDE (POUR BTL) OPTIME
TOPICAL | Status: DC | PRN
  Administered 2021-01-24: 500 mL

## 2021-01-24 MED ORDER — FENTANYL CITRATE (PF) 100 MCG/2ML IJ SOLN
INTRAMUSCULAR | Status: AC
  Administered 2021-01-24: 50 ug via INTRAVENOUS
  Filled 2021-01-24: qty 2

## 2021-01-24 MED ORDER — LIDOCAINE HCL (CARDIAC) PF 100 MG/5ML IV SOSY
PREFILLED_SYRINGE | INTRAVENOUS | Status: DC | PRN
  Administered 2021-01-24: 80 mg via INTRAVENOUS

## 2021-01-24 MED ORDER — FENTANYL CITRATE (PF) 100 MCG/2ML IJ SOLN
INTRAMUSCULAR | Status: DC | PRN
  Administered 2021-01-24 (×2): 25 ug via INTRAVENOUS
  Administered 2021-01-24 (×2): 50 ug via INTRAVENOUS
  Administered 2021-01-24 (×2): 25 ug via INTRAVENOUS

## 2021-01-24 MED ORDER — PROPOFOL 10 MG/ML IV BOLUS
INTRAVENOUS | Status: AC
  Filled 2021-01-24: qty 20

## 2021-01-24 MED ORDER — FENTANYL CITRATE (PF) 100 MCG/2ML IJ SOLN
25.0000 ug | INTRAMUSCULAR | Status: DC | PRN
  Administered 2021-01-24: 50 ug via INTRAVENOUS

## 2021-01-24 MED ORDER — DEXMEDETOMIDINE (PRECEDEX) IN NS 20 MCG/5ML (4 MCG/ML) IV SYRINGE
PREFILLED_SYRINGE | INTRAVENOUS | Status: DC | PRN
  Administered 2021-01-24 (×2): 8 ug via INTRAVENOUS
  Administered 2021-01-24: 4 ug via INTRAVENOUS

## 2021-01-24 MED ORDER — MIDAZOLAM HCL 2 MG/2ML IJ SOLN
INTRAMUSCULAR | Status: DC | PRN
  Administered 2021-01-24: 2 mg via INTRAVENOUS

## 2021-01-24 MED ORDER — PHENYLEPHRINE HCL (PRESSORS) 10 MG/ML IV SOLN
INTRAVENOUS | Status: DC | PRN
  Administered 2021-01-24: 100 ug via INTRAVENOUS
  Administered 2021-01-24: 50 ug via INTRAVENOUS
  Administered 2021-01-24 (×2): 100 ug via INTRAVENOUS

## 2021-01-24 MED ORDER — GLYCOPYRROLATE 0.2 MG/ML IJ SOLN
INTRAMUSCULAR | Status: AC
  Filled 2021-01-24: qty 1

## 2021-01-24 MED ORDER — ONDANSETRON HCL 4 MG/2ML IJ SOLN
INTRAMUSCULAR | Status: DC | PRN
  Administered 2021-01-24: 4 mg via INTRAVENOUS

## 2021-01-24 MED ORDER — MEPERIDINE HCL 25 MG/ML IJ SOLN: 6.2500 mg | INTRAMUSCULAR | Status: DC | PRN

## 2021-01-24 MED ORDER — LIDOCAINE HCL (PF) 2 % IJ SOLN
INTRAMUSCULAR | Status: AC
  Filled 2021-01-24: qty 5

## 2021-01-24 MED ORDER — PROPOFOL 10 MG/ML IV BOLUS
INTRAVENOUS | Status: DC | PRN
  Administered 2021-01-24: 200 mg via INTRAVENOUS

## 2021-01-24 MED ORDER — SEVOFLURANE IN SOLN
RESPIRATORY_TRACT | Status: AC
  Filled 2021-01-24: qty 250

## 2021-01-24 SURGICAL SUPPLY — 20 items
CANISTER SUCT 1200ML W/VALVE (MISCELLANEOUS) ×2 IMPLANT
CANISTER WOUND CARE 500ML ATS (WOUND CARE) ×3 IMPLANT
DRAPE DERMATAC (DRAPES) ×4 IMPLANT
DRAPE FOR WOUND VAC UNIT (DRAPES) ×1 IMPLANT
DRAPE LAPAROTOMY 100X77 ABD (DRAPES) ×2 IMPLANT
DRSG VAC ATS LRG SENSATRAC (GAUZE/BANDAGES/DRESSINGS) ×2 IMPLANT
DRSG VAC ATS MED SENSATRAC (GAUZE/BANDAGES/DRESSINGS) ×1 IMPLANT
ELECT REM PT RETURN 9FT ADLT (ELECTROSURGICAL) ×2 IMPLANT
ELECTRODE REM PT RTRN 9FT ADLT (ELECTROSURGICAL) ×1 IMPLANT
GAUZE 4X4 16PLY ~~LOC~~+RFID DBL (SPONGE) ×2 IMPLANT
GLOVE SURG ENC MOIS LTX SZ6.5 (GLOVE) ×2 IMPLANT
GLOVE SURG UNDER POLY LF SZ6.5 (GLOVE) ×2 IMPLANT
GOWN STRL REUS W/ TWL LRG LVL3 (GOWN DISPOSABLE) ×2 IMPLANT
GOWN STRL REUS W/TWL LRG LVL3 (GOWN DISPOSABLE) ×4
KIT TURNOVER KIT A (KITS) ×2 IMPLANT
MANIFOLD NEPTUNE II (INSTRUMENTS) ×2 IMPLANT
PACK BASIN MINOR ARMC (MISCELLANEOUS) ×2 IMPLANT
SOL PREP PVP 2OZ (MISCELLANEOUS) ×2 IMPLANT
SOLUTION PREP PVP 2OZ (MISCELLANEOUS) ×1 IMPLANT
SPONGE T-LAP 18X18 ~~LOC~~+RFID (SPONGE) ×2 IMPLANT

## 2021-01-24 NOTE — Interval H&P Note (Signed)
History and Physical Interval Note:  01/24/2021 8:16 AM  Kaylee [name]  has presented today for surgery, with the diagnosis of Left leg necrotizing fasciitis.  The various methods of treatment have been discussed with the patient and family. After consideration of risks, benefits and other options for treatment, the patient has consented to  Procedure(s): APPLICATION OF WOUND VAC-WOUND VAC EXCHANGE (N/A) as a surgical intervention.  The patient's history has been reviewed, patient examined, no change in status, stable for surgery.  I have reviewed the patient's chart and labs.  Questions were answered to the patient's satisfaction.     Carolan Shiver

## 2021-01-24 NOTE — Transfer of Care (Signed)
Immediate Anesthesia Transfer of Care Note  Patient: Kaylee [name]  Procedure(s) Performed: APPLICATION OF WOUND VAC-WOUND VAC EXCHANGE  Patient Location: PACU  Anesthesia Type:General  Level of Consciousness: drowsy and patient cooperative  Airway & Oxygen Therapy: Patient Spontanous Breathing and Patient connected to face mask oxygen  Post-op Assessment: Report given to RN and Post -op Vital signs reviewed and stable  Post vital signs: Reviewed and stable  Last Vitals:  Vitals Value Taken Time  BP 102/59 01/24/21 1000  Temp    Pulse    Resp 13 01/24/21 1001  SpO2 100   Vitals shown include unvalidated device data.  Last Pain:  Vitals:   01/24/21 0824  TempSrc: Oral  PainSc: 10-Worst pain ever      Patients Stated Pain Goal: 0 (01/24/21 0824)  Complications: No notable events documented.

## 2021-01-24 NOTE — Anesthesia Postprocedure Evaluation (Signed)
Anesthesia Post Note  Patient: Kaylee [name]  Procedure(s) Performed: APPLICATION OF WOUND VAC-WOUND VAC EXCHANGE  Patient location during evaluation: PACU Anesthesia Type: General Level of consciousness: awake and alert, awake and oriented Pain management: pain level controlled Vital Signs Assessment: post-procedure vital signs reviewed and stable Respiratory status: spontaneous breathing, nonlabored ventilation and respiratory function stable Cardiovascular status: blood pressure returned to baseline and stable Postop Assessment: no apparent nausea or vomiting Anesthetic complications: no   No notable events documented.   Last Vitals:  Vitals:   01/24/21 1055 01/24/21 1124  BP: (!) 105/53 111/60  Pulse: 87 86  Resp: 16 18  Temp: 37.1 C 37 C  SpO2: 95% 97%    Last Pain:  Vitals:   01/24/21 1125  TempSrc:   PainSc: 3                  Manfred Arch

## 2021-01-24 NOTE — Progress Notes (Signed)
PT Cancellation Note  Patient Details Name: KASI LASKY MRN: [id number] DOB: August 22, 1960   Cancelled Treatment:    Reason Eval/Treat Not Completed: Patient at procedure or test/unavailable (Consult received and chart reviewed.  Patient currently off unit for procedure; will re-attempt at later time/date as medically appropriate and available.)  Shelden Raborn H. Manson Passey, PT, DPT, NCS 01/24/21, 9:23 AM [phone]

## 2021-01-24 NOTE — Progress Notes (Signed)
PROGRESS NOTE    Kaylee [name]  JOA:[id number] DOB: [birth year] DOA: 01/15/2021 PCP: [name], MD   Assessment & Plan:   Principal Problem:   Cellulitis of left leg Active Problems:   Chronic pain syndrome   GERD (gastroesophageal reflux disease)   Bipolar 1 disorder (HCC)   COPD (chronic obstructive pulmonary disease) (HCC)   Sepsis (HCC)   History of parasitic infection   Necrotizing fasciitis of LLE: s/p wound debridement on 7/7-7/8, 7/11, 7/13 & 7/15 as per general surg. Intraop cxs growing MRSA & S. Pyogenes. Continue on IV linezolid as per ID.  Leukocytosis: likely secondary to above infection. Continue on IV abxs   Thrombocytosis: likely secondary to above infection. Management as stated above   Substance use disorder: urine drug screen on 01/17/21 was positive for amphetamines, benzos, & opiates. Continue w/ supportive care  Substance-induced mood disorder & bipolar depression: severity unknown. Continue on reduced dose of abilify while on linezolid. Continue to hold home dose of celexa while on linezolid. Given Abilify, ID and Dr. Harl Bowie recommended scheduled diazepam to prevent serotonin syndrome while on linezolid. Continue on diazepam   Hypokalemia: WNL today    COPD: w/o exacerbation. Encourage incentive spirometry. Continue on bronchodilators   Hyponatremia: labile. Will continue to monitor    GERD: continue on PPI    Likely subacute blood loss anemia: likely from nec-fasciitis. H&H are labile. No need for a transfusion currently   DVT prophylaxis: lovenox  Code Status: full Family Communication:  Disposition Plan: depends on PT/OT recs   Level of care: Med-Surg  Status is: Inpatient  Remains inpatient appropriate because:Ongoing diagnostic testing needed not appropriate for outpatient work up, IV treatments appropriate due to intensity of illness or inability to take PO, and Inpatient level of care appropriate due to severity of  illness  Dispo: The patient is from: Home              Anticipated d/c is to: Home              Patient currently is not medically stable to d/c.   Difficult to place patient Yes        Consultants:  General surg  ID  Procedures:   Antimicrobials: linezolid    Subjective: Pt still c/o LLE pain    Objective: Vitals:   01/23/21 1559 01/23/21 2011 01/23/21 2349 01/24/21 0356  BP: 131/73 (!) 102/58 115/61 (!) 114/57  Pulse: 68 86 82 83  Resp: 16 20 19 20   Temp: 98.6 F (37 C) 98 F (36.7 C) 98.1 F (36.7 C) 98.2 F (36.8 C)  TempSrc: Oral     SpO2: 100% 99% 100% 100%  Weight:      Height:        Intake/Output Summary (Last 24 hours) at 01/24/2021 0722 Last data filed at 01/24/2021 0358 Gross per 24 hour  Intake 180 ml  Output 1825 ml  Net -1645 ml   Filed Weights   01/15/21 1600 01/20/21 0953  Weight: 65.8 kg 65.8 kg    Examination:  General exam: Appears calm but uncomfortable   Respiratory system: clear breath sounds b/l  Cardiovascular system: S1 & S2+. No rubs or clicks  Gastrointestinal system: Abd is soft, NT, ND & hypoactive bowel sounds  Central nervous system: Alert and oriented. Moves all extremities   Psychiatry: Judgement and insight appear normal. Flat mood and affect    Data Reviewed: I have personally reviewed following labs and imaging studies  CBC: Recent  Labs  Lab 01/19/21 2130 01/20/21 0609 01/21/21 0437 01/22/21 0508 01/23/21 0656 01/24/21 0638  WBC 21.5* 26.8* 24.5* 17.1* 13.5* 18.0*  NEUTROABS 17.1* 21.6* 20.0*  --  10.1* 13.5*  HGB 9.3* 9.2* 8.7* 8.5* 8.5* 8.9*  HCT 26.4* 26.6* 25.7* 25.4* 26.4* 27.3*  MCV 85.2 87.2 88.9 91.7 91.0 90.7  PLT 376 377 423* 412* 509* 593*   Basic Metabolic Panel: Recent Labs  Lab 01/19/21 0642 01/20/21 0609 01/21/21 0437 01/22/21 0508 01/23/21 0656  NA 130* 132* 131* 133* 135  K 3.6 4.0 4.0 4.4 3.8  CL 99 99 97* 99 98  CO2 GLUCOSE 86 88 167* 93 105*  BUN 7*  CREATININE 0.47 0.43* 0.41* 0.42* 0.40*  CALCIUM 7.7* 7.9* 7.7* 7.9* 8.1*  MG  --  1.8  --   --   --   PHOS  --  3.4  --   --   --    GFR: Estimated Creatinine Clearance: 69.1 mL/min (A) (by C-G formula based on SCr of 0.4 mg/dL (L)). Liver Function Tests: No results for input(s): AST, ALT, ALKPHOS, BILITOT, PROT, ALBUMIN in the last 168 hours.  No results for input(s): LIPASE, AMYLASE in the last 168 hours. No results for input(s): AMMONIA in the last 168 hours. Coagulation Profile: No results for input(s): INR, PROTIME in the last 168 hours.  Cardiac Enzymes: Recent Labs  Lab 01/18/21 0510  CKTOTAL 18*   BNP (last 3 results) No results for input(s): PROBNP in the last 8760 hours. HbA1C: No results for input(s): HGBA1C in the last 72 hours. CBG: Recent Labs  Lab 01/22/21 2159 01/23/21 0718 01/23/21 1225 01/23/21 1702 01/23/21 2130  GLUCAP 124* 99 114* 121* 110*   Lipid Profile: No results for input(s): CHOL, HDL, LDLCALC, TRIG, CHOLHDL, LDLDIRECT in the last 72 hours. Thyroid Function Tests: No results for input(s): TSH, T4TOTAL, FREET4, T3FREE, THYROIDAB in the last 72 hours. Anemia Panel: No results for input(s): VITAMINB12, FOLATE, FERRITIN, TIBC, IRON, RETICCTPCT in the last 72 hours. Sepsis Labs: Recent Labs  Lab 01/18/21 0510  LATICACIDVEN 1.4    Recent Results (from the past 240 hour(s))  Resp Panel by RT-PCR (Flu A&B, Covid) Nasopharyngeal Swab     Status: None   Collection Time: 01/15/21  6:49 PM   Specimen: Nasopharyngeal Swab; Nasopharyngeal(NP) swabs in vial transport medium  Result Value Ref Range Status   SARS Coronavirus 2 by RT PCR NEGATIVE NEGATIVE Final    Comment: (NOTE) SARS-CoV-2 target nucleic acids are NOT DETECTED.  The SARS-CoV-2 RNA is generally detectable in upper respiratory specimens during the acute phase of infection. The lowest concentration of SARS-CoV-2 viral copies this assay can detect is 138 copies/mL. A  negative result does not preclude SARS-Cov-2 infection and should not be used as the sole basis for treatment or other patient management decisions. A negative result may occur with  improper specimen collection/handling, submission of specimen other than nasopharyngeal swab, presence of viral mutation(s) within the areas targeted by this assay, and inadequate number of viral copies(<138 copies/mL). A negative result must be combined with clinical observations, patient history, and epidemiological information. The expected result is Negative.  Fact Sheet for Patients:  BloggerCourse.com  Fact Sheet for Healthcare Providers:  SeriousBroker.it  This test is no t yet approved or cleared by the [place] FDA and  has been authorized for detection and/or diagnosis of SARS-CoV-2 by FDA under an Emergency Use Authorization (  EUA). This EUA will remain  in effect (meaning this test can be used) for the duration of the COVID-19 declaration under Section 564(b)(1) of the Act, 21 U.S.C.section 360bbb-3(b)(1), unless the authorization is terminated  or revoked sooner.       Influenza A by PCR NEGATIVE NEGATIVE Final   Influenza B by PCR NEGATIVE NEGATIVE Final    Comment: (NOTE) The Xpert Xpress SARS-CoV-2/FLU/RSV plus assay is intended as an aid in the diagnosis of influenza from Nasopharyngeal swab specimens and should not be used as a sole basis for treatment. Nasal washings and aspirates are unacceptable for Xpert Xpress SARS-CoV-2/FLU/RSV testing.  Fact Sheet for Patients: BloggerCourse.com  Fact Sheet for Healthcare Providers: SeriousBroker.it  This test is not yet approved or cleared by the [place] FDA and has been authorized for detection and/or diagnosis of SARS-CoV-2 by FDA under an Emergency Use Authorization (EUA). This EUA will remain in effect (meaning this test can  be used) for the duration of the COVID-19 declaration under Section 564(b)(1) of the Act, 21 U.S.C. section 360bbb-3(b)(1), unless the authorization is terminated or revoked.  Performed at [facility], [address] [place], [place] [address]   Blood Culture (routine x 2)     Status: None   Collection Time: 01/15/21  6:49 PM   Specimen: BLOOD  Result Value Ref Range Status   Specimen Description BLOOD RIGHT ANTECUBITAL  Final   Special Requests   Final    BOTTLES DRAWN AEROBIC AND ANAEROBIC Blood Culture results may not be optimal due to an inadequate volume of blood received in culture bottles   Culture   Final    NO GROWTH 5 DAYS Performed at [facility], [address], [place], [place] [address]    Report Status 01/20/2021 FINAL  Final  Blood Culture (routine x 2)     Status: None   Collection Time: 01/15/21  6:49 PM   Specimen: BLOOD  Result Value Ref Range Status   Specimen Description BLOOD BLOOD RIGHT HAND  Final   Special Requests   Final    BOTTLES DRAWN AEROBIC AND ANAEROBIC Blood Culture results may not be optimal due to an inadequate volume of blood received in culture bottles   Culture   Final    NO GROWTH 5 DAYS Performed at [facility], [address], [place], [place] [address]    Report Status 01/20/2021 FINAL  Final  Urine culture     Status: Abnormal   Collection Time: 01/15/21  8:48 PM   Specimen: In/Out Cath Urine  Result Value Ref Range Status   Specimen Description   Final    IN/OUT CATH URINE Performed at [facility], [address], [place], [place] [address]    Special Requests   Final    NONE Performed at [facility], [address], [place], [place] [address]    Culture (A)  Final    <10,000 COLONIES/mL INSIGNIFICANT GROWTH Performed at [facility], [address], [place], [place] [address]    Report Status 01/17/2021 FINAL  Final  Aerobic Culture w Gram Stain (superficial  specimen)     Status: None   Collection Time: 01/16/21  3:40 PM   Specimen: Wound  Result Value Ref Range Status   Specimen Description   Final    WOUND Performed at [facility], [address] [place], [place] [address]    Special Requests   Final    NONE Performed  at [facility], [address] [place], [place] [address]    Gram Stain NO WBC SEEN MODERATE GRAM POSITIVE COCCI   Final   Culture   Final    MODERATE METHICILLIN RESISTANT STAPHYLOCOCCUS AUREUS MODERATE STREPTOCOCCUS PYOGENES Beta hemolytic streptococci are predictably susceptible to penicillin and other beta lactams. Susceptibility testing not routinely performed. Performed at [facility], [address] [place], [place] [address]    Report Status 01/19/2021 FINAL  Final   Organism ID, Bacteria METHICILLIN RESISTANT STAPHYLOCOCCUS AUREUS  Final      Susceptibility   Methicillin resistant staphylococcus aureus - MIC*    CIPROFLOXACIN >=8 RESISTANT Resistant     ERYTHROMYCIN >=8 RESISTANT Resistant     GENTAMICIN <=0.5 SENSITIVE Sensitive     OXACILLIN >=4 RESISTANT Resistant     TETRACYCLINE <=1 SENSITIVE Sensitive     VANCOMYCIN <=0.5 SENSITIVE Sensitive     TRIMETH/SULFA 80 RESISTANT Resistant     CLINDAMYCIN <=0.25 SENSITIVE Sensitive     RIFAMPIN <=0.5 SENSITIVE Sensitive     Inducible Clindamycin NEGATIVE Sensitive     * MODERATE METHICILLIN RESISTANT STAPHYLOCOCCUS AUREUS         Radiology Studies: No results found.      Scheduled Meds:  ARIPiprazole  5 mg Oral Daily   Chlorhexidine Gluconate Cloth  6 each Topical Daily   Chlorhexidine Gluconate Cloth  6 each Topical Q0600   diazepam  5 mg Oral Q6H   enoxaparin (LOVENOX) injection  40 mg Subcutaneous Q24H   insulin aspart  0-5 Units Subcutaneous QHS   insulin aspart  0-9 Units Subcutaneous TID WC   linaclotide  290 mcg Oral QAC breakfast   pantoprazole  20 mg Oral Daily   polyethylene glycol  17 g Oral Daily    senna-docusate  2 tablet Oral Daily   Continuous Infusions:  sodium chloride 10 mL/hr at 01/22/21 2212   linezolid (ZYVOX) IV 600 mg (01/23/21 2313)     LOS: 9 days    Time spent: 30 mins     Charise Killian, MD [facility] Pager [phone]  If 7PM-7AM, please contact night-coverage  01/24/2021, 7:22 AM

## 2021-01-24 NOTE — Plan of Care (Signed)

## 2021-01-24 NOTE — Op Note (Addendum)
ATTENDING Surgeon(s): Carolan Shiver, MD   ANESTHESIA: General   PRE-OPERATIVE DIAGNOSIS: Left lower extremity necrotizing fasciitis   POST-OPERATIVE DIAGNOSIS: Left lower extremity necrotizing fasciitis   PROCEDURE(S): 1.)  Negative pressure dressing DME placement   INTRAOPERATIVE FINDINGS:  Preoperatively there was a 70 cm long x 30 cm wide open wound Postoperatively there was a 70 cm long x 30 cm wide (2100 sq cm) Today, No new area of debridement. No gross necrotic tissue.           ESTIMATED BLOOD LOSS: 0 mL    SPECIMENS: None   COMPLICATIONS: None apparent   CONDITION AT END OF PROCEDURE: Hemodynamically stable and awake   INDICATIONS FOR PROCEDURE:  Patient is 60 year old female with necrotizing fasciitis. Beside complex wound vac change not tolerated by patient at this moment due to complexity and pain.    DETAILS OF PROCEDURE: After informed consent,patient was taken to the OR. Time out performed. General anesthesia induced. Patient placed on supine position. Negative pressure dressing removed. The left lower extremity was cleaned and draped in sterile fashion.  The open wounds were thoroughly irrigated with saline.  No gross purulence or ischemic tissue identified. Hemostasis was achieved.  I personally placed the complex negative pressure dressing.  Patient basically has 2 separate negative pressure dressing systems.

## 2021-01-24 NOTE — Anesthesia Procedure Notes (Addendum)
Procedure Name: LMA Insertion Date/Time: 01/24/2021 9:01 AM Performed by: Elmarie Mainland, CRNA Pre-anesthesia Checklist: Patient identified, Timeout performed, Emergency Drugs available, Suction available and Patient being monitored Patient Re-evaluated:Patient Re-evaluated prior to induction Oxygen Delivery Method: Circle system utilized Preoxygenation: Pre-oxygenation with 100% oxygen Induction Type: IV induction LMA: LMA inserted LMA Size: 3.5
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
## 2021-01-24 NOTE — Progress Notes (Signed)
ID Pt stable Went to OR and had some debridement and wet vac changed to dry vac Pain better Patient Vitals for the past 24 hrs:  BP Temp Temp src Pulse Resp SpO2 Height Weight  01/24/21 1556 (!) 110/58 98.3 F (36.8 C) -- 83 17 98 % -- --  01/24/21 1124 111/60 98.6 F (37 C) -- 86 18 97 % -- --  01/24/21 1055 (!) 105/53 98.8 F (37.1 C) -- 87 16 95 % -- --  01/24/21 1045 (!) 114/56 -- -- 82 12 100 % -- --  01/24/21 1030 114/67 98.1 F (36.7 C) -- 84 16 98 % -- --  01/24/21 1015 (!) 106/55 -- -- 86 17 100 % -- --  01/24/21 0956 (!) 102/59 (!) 97.5 F (36.4 C) -- -- 13 100 % -- --  01/24/21 0824 110/64 98.4 F (36.9 C) Oral 84 18 100 % 5\' 6"  (1.676 m) 65.8 kg  01/24/21 0356 (!) 114/57 98.2 F (36.8 C) -- 83 20 100 % -- --  01/23/21 2349 115/61 98.1 F (36.7 C) -- 82 19 100 % -- --  01/23/21 2011 (!) 102/58 98 F (36.7 C) -- 86 20 99 % -- --    Awake and alert Had wound vac change today Chest b/l air entry [id number] Abd soft Swelling of the leg less today  Labs CBC Latest Ref Rng & Units 01/24/2021 01/23/2021 01/22/2021  WBC 4.0 - 10.5 K/uL 18.0(H) 13.5(H) 17.1(H)  Hemoglobin 12.0 - 15.0 g/dL 01/24/2021) 9.1(M) 3.8(G)  Hematocrit 36.0 - 46.0 % 27.3(L) 26.4(L) 25.4(L)  Platelets 150 - 400 K/uL 593(H) 509(H) 412(H)     CMP Latest Ref Rng & Units 01/24/2021 01/23/2021 01/22/2021  Glucose 70 - 99 mg/dL 95 01/24/2021) 93  BUN 8 - 23 mg/dL 10 7(L) 13  Creatinine 0.44 - 1.00 mg/dL 993(T 7.01) 7.79(T)  Sodium 135 - 145 mmol/L 134(L) 135 133(L)  Potassium 3.5 - 5.1 mmol/L 4.4 3.8 4.4  Chloride 98 - 111 mmol/L 98 98 99  CO2 22 - 32 mmol/L 31 30 31   Calcium 8.9 - 10.3 mg/dL 8.2(L) 8.1(L) 7.9(L)  Total Protein 6.5 - 8.1 g/dL - - -  Total Bilirubin 0.3 - 1.2 mg/dL - - -  Alkaline Phos 38 - 126 U/L - - -  AST 15 - 41 U/L - - -  ALT 0 - 44 U/L - - -     Impression/recommendation Necrotizing facitis- Slight increase in Wbc from 13> 18 Closely observe Continue current regimen of linezolid- no  change now  Bipolar On abilify- Also on valium 5 mg Q6 No signs of serotonin syndrome with linezolid  Discussed the management with the patient ID will follow her peripherally this weekend . Call if needed

## 2021-01-25 ENCOUNTER — Encounter: Payer: Self-pay | Admitting: General Surgery

## 2021-01-25 DIAGNOSIS — D75839 Thrombocytosis, unspecified: Secondary | ICD-10-CM | POA: Diagnosis not present

## 2021-01-25 DIAGNOSIS — M726 Necrotizing fasciitis: Secondary | ICD-10-CM | POA: Diagnosis not present

## 2021-01-25 DIAGNOSIS — D72828 Other elevated white blood cell count: Secondary | ICD-10-CM | POA: Diagnosis not present

## 2021-01-25 LAB — BASIC METABOLIC PANEL
Anion gap: 4 — ABNORMAL LOW (ref 5–15)
BUN: 10 mg/dL (ref 8–23)
CO2: 30 mmol/L (ref 22–32)
Calcium: 8 mg/dL — ABNORMAL LOW (ref 8.9–10.3)
Chloride: 101 mmol/L (ref 98–111)
Creatinine, Ser: 0.41 mg/dL — ABNORMAL LOW (ref 0.44–1.00)
GFR, Estimated: >60 mL/min (ref >60)
Glucose, Bld: 106 mg/dL — ABNORMAL HIGH (ref 70–99)
Potassium: 3.9 mmol/L (ref 3.5–5.1)
Sodium: 135 mmol/L (ref 135–145)

## 2021-01-25 LAB — CBC WITH DIFFERENTIAL/PLATELET
Abs Immature Granulocytes: 0.14 10*3/uL — ABNORMAL HIGH (ref 0.00–0.07)
Basophils Absolute: 0 10*3/uL (ref 0.0–0.1)
Basophils Relative: 0 %
Eosinophils Absolute: 0.1 10*3/uL (ref 0.0–0.5)
Eosinophils Relative: 1 %
HCT: 25.2 % — ABNORMAL LOW (ref 36.0–46.0)
Hemoglobin: 8.2 g/dL — ABNORMAL LOW (ref 12.0–15.0)
Immature Granulocytes: 1 %
Lymphocytes Relative: 27 %
Lymphs Abs: 3.5 10*3/uL (ref 0.7–4.0)
MCH: 29.4 pg (ref 26.0–34.0)
MCHC: 32.5 g/dL (ref 30.0–36.0)
MCV: 90.3 fL (ref 80.0–100.0)
Monocytes Absolute: 0.8 10*3/uL (ref 0.1–1.0)
Monocytes Relative: 6 %
Neutro Abs: 8.5 10*3/uL — ABNORMAL HIGH (ref 1.7–7.7)
Neutrophils Relative %: 65 %
Platelets: 549 10*3/uL — ABNORMAL HIGH (ref 150–400)
RBC: 2.79 MIL/uL — ABNORMAL LOW (ref 3.87–5.11)
RDW: 15.3 % (ref 11.5–15.5)
WBC: 13 10*3/uL — ABNORMAL HIGH (ref 4.0–10.5)
nRBC: 0 % (ref 0.0–0.2)

## 2021-01-25 MED ORDER — POLYMYXIN B-TRIMETHOPRIM 10000-0.1 UNIT/ML-% OP SOLN
1.0000 [drp] | Freq: Four times a day (QID) | OPHTHALMIC | Status: DC
  Administered 2021-01-25 – 2021-02-03 (×28): 1 [drp] via OPHTHALMIC
  Filled 2021-01-25: qty 10

## 2021-01-25 NOTE — Progress Notes (Signed)
PROGRESS NOTE    Kaylee [name]  ONG:[id number] DOB: [birth year] DOA: 01/15/2021 PCP: [name], MD   Assessment & Plan:   Principal Problem:   Cellulitis of left leg Active Problems:   Chronic pain syndrome   GERD (gastroesophageal reflux disease)   Bipolar 1 disorder (HCC)   COPD (chronic obstructive pulmonary disease) (HCC)   Sepsis (HCC)   History of parasitic infection   Necrotizing fasciitis of LLE: s/p wound debridement on 7/7-7/8, 7/11, 7/13 & 7/15 as per general surg. Will likely go back to the OR on 7/19. Intraop cxs growing MRSA & S. Pyogenes. Continue on IV linezolid as per ID  Leukocytosis: trending down today. Continue on IV abxs  Thrombocytosis: likely secondary to above infection.  Substance use disorder: urine drug screen on 01/17/21 was positive for amphetamines, benzos, & opiates. Continue w/ supportive care  Substance-induced mood disorder & bipolar depression: severity unknown. Continue on reduced dose of abilify while on linezolid. Continue to hold home dose of celexa while on linezolid. Given Abilify, ID and Dr. Harl Bowie recommended scheduled diazepam to prevent serotonin syndrome while on linezolid. Continue on diazepam   Hypokalemia: within normal limits today    COPD: w/o exacerbation. Encourage incentive spirometry. Continue on bronchodilators   Hyponatremia: resolved    GERD: continue on PPI    Likely subacute blood loss anemia: likely from nec-fasciitis. H&H labile. Will transfuse if Hb < 7.0   DVT prophylaxis: lovenox  Code Status: full Family Communication:  Disposition Plan: PT recs SNF   Level of care: Med-Surg  Status is: Inpatient  Remains inpatient appropriate because:Ongoing diagnostic testing needed not appropriate for outpatient work up, IV treatments appropriate due to intensity of illness or inability to take PO, and Inpatient level of care appropriate due to severity of illness  Dispo: The patient is from: Home               Anticipated d/c is to: Home vs SNF              Patient currently is not medically stable to d/c.   Difficult to place patient Yes        Consultants:  General surg  ID  Procedures:   Antimicrobials: linezolid    Subjective: Pt c/o LLE pain   Objective: Vitals:   01/24/21 1124 01/24/21 1556 01/24/21 2005 01/25/21 0559  BP: 111/60 (!) 110/58 105/69 (!) 104/57  Pulse: 86 83 85 77  Resp: Temp: 98.6 F (37 C) 98.3 F (36.8 C) 98.2 F (36.8 C) 97.6 F (36.4 C)  TempSrc:      SpO2: 97% 98% 99% 100%  Weight:      Height:        Intake/Output Summary (Last 24 hours) at 01/25/2021 0729 Last data filed at 01/25/2021 0616 Gross per 24 hour  Intake 1780 ml  Output 3650 ml  Net -1870 ml   Filed Weights   01/15/21 1600 01/20/21 0953 01/24/21 0824  Weight: 65.8 kg 65.8 kg 65.8 kg    Examination:  General exam: Appears comfortable    Respiratory system: clear breath sounds b/l. No rales  Cardiovascular system: S1/S2+. No gallops or clicks  Gastrointestinal system: Abd is soft, NT, ND & hypoactive bowel sounds  Central nervous system: Alert and oriented. Moves all extremities  Psychiatry: judgement and insight appear normal. Flat mood and affect     Data Reviewed: I have personally reviewed following labs and imaging studies  CBC: Recent  Labs  Lab 01/20/21 0609 01/21/21 0437 01/22/21 0508 01/23/21 0656 01/24/21 0638 01/25/21 0504  WBC 26.8* 24.5* 17.1* 13.5* 18.0* 13.0*  NEUTROABS 21.6* 20.0*  --  10.1* 13.5* 8.5*  HGB 9.2* 8.7* 8.5* 8.5* 8.9* 8.2*  HCT 26.6* 25.7* 25.4* 26.4* 27.3* 25.2*  MCV 87.2 88.9 91.7 91.0 90.7 90.3  PLT 377 423* 412* 509* 593* 549*   Basic Metabolic Panel: Recent Labs  Lab 01/20/21 0609 01/21/21 0437 01/22/21 0508 01/23/21 0656 01/24/21 0638 01/25/21 0504  NA 132* 131* 133* 135 134* 135  K 4.0 4.0 4.4 3.8 4.4 3.9  CL 99 97* 99 98 98 101  CO2 28 29 31 30 31 30   GLUCOSE 88 167* 93 105* 95 106*  BUN 9  10 13  7* 10 10  CREATININE 0.43* 0.41* 0.42* 0.40* 0.50 0.41*  CALCIUM 7.9* 7.7* 7.9* 8.1* 8.2* 8.0*  MG 1.8  --   --   --   --   --   PHOS 3.4  --   --   --   --   --    GFR: Estimated Creatinine Clearance: 69.1 mL/min (A) (by C-G formula based on SCr of 0.41 mg/dL (L)). Liver Function Tests: No results for input(s): AST, ALT, ALKPHOS, BILITOT, PROT, ALBUMIN in the last 168 hours.  No results for input(s): LIPASE, AMYLASE in the last 168 hours. No results for input(s): AMMONIA in the last 168 hours. Coagulation Profile: No results for input(s): INR, PROTIME in the last 168 hours.  Cardiac Enzymes: No results for input(s): CKTOTAL, CKMB, CKMBINDEX, TROPONINI in the last 168 hours.  BNP (last 3 results) No results for input(s): PROBNP in the last 8760 hours. HbA1C: No results for input(s): HGBA1C in the last 72 hours. CBG: Recent Labs  Lab 01/22/21 2159 01/23/21 0718 01/23/21 1225 01/23/21 1702 01/23/21 2130  GLUCAP 124* 99 114* 121* 110*   Lipid Profile: No results for input(s): CHOL, HDL, LDLCALC, TRIG, CHOLHDL, LDLDIRECT in the last 72 hours. Thyroid Function Tests: No results for input(s): TSH, T4TOTAL, FREET4, T3FREE, THYROIDAB in the last 72 hours. Anemia Panel: No results for input(s): VITAMINB12, FOLATE, FERRITIN, TIBC, IRON, RETICCTPCT in the last 72 hours. Sepsis Labs: No results for input(s): PROCALCITON, LATICACIDVEN in the last 168 hours.   Recent Results (from the past 240 hour(s))  Resp Panel by RT-PCR (Flu A&B, Covid) Nasopharyngeal Swab     Status: None   Collection Time: 01/15/21  6:49 PM   Specimen: Nasopharyngeal Swab; Nasopharyngeal(NP) swabs in vial transport medium  Result Value Ref Range Status   SARS Coronavirus 2 by RT PCR NEGATIVE NEGATIVE Final    Comment: (NOTE) SARS-CoV-2 target nucleic acids are NOT DETECTED.  The SARS-CoV-2 RNA is generally detectable in upper respiratory specimens during the acute phase of infection. The  lowest concentration of SARS-CoV-2 viral copies this assay can detect is 138 copies/mL. A negative result does not preclude SARS-Cov-2 infection and should not be used as the sole basis for treatment or other patient management decisions. A negative result may occur with  improper specimen collection/handling, submission of specimen other than nasopharyngeal swab, presence of viral mutation(s) within the areas targeted by this assay, and inadequate number of viral copies(<138 copies/mL). A negative result must be combined with clinical observations, patient history, and epidemiological information. The expected result is Negative.  Fact Sheet for Patients:  BloggerCourse.com  Fact Sheet for Healthcare Providers:  SeriousBroker.it  This test is no t yet approved or cleared by the [place]  [place] FDA and  has been authorized for detection and/or diagnosis of SARS-CoV-2 by FDA under an Emergency Use Authorization (EUA). This EUA will remain  in effect (meaning this test can be used) for the duration of the COVID-19 declaration under Section 564(b)(1) of the Act, 21 U.S.C.section 360bbb-3(b)(1), unless the authorization is terminated  or revoked sooner.       Influenza A by PCR NEGATIVE NEGATIVE Final   Influenza B by PCR NEGATIVE NEGATIVE Final    Comment: (NOTE) The Xpert Xpress SARS-CoV-2/FLU/RSV plus assay is intended as an aid in the diagnosis of influenza from Nasopharyngeal swab specimens and should not be used as a sole basis for treatment. Nasal washings and aspirates are unacceptable for Xpert Xpress SARS-CoV-2/FLU/RSV testing.  Fact Sheet for Patients: BloggerCourse.com  Fact Sheet for Healthcare Providers: SeriousBroker.it  This test is not yet approved or cleared by the [place] FDA and has been authorized for detection and/or diagnosis of SARS-CoV-2 by FDA under  an Emergency Use Authorization (EUA). This EUA will remain in effect (meaning this test can be used) for the duration of the COVID-19 declaration under Section 564(b)(1) of the Act, 21 U.S.C. section 360bbb-3(b)(1), unless the authorization is terminated or revoked.  Performed at [facility], [address] [place], [place] [address]   Blood Culture (routine x 2)     Status: None   Collection Time: 01/15/21  6:49 PM   Specimen: BLOOD  Result Value Ref Range Status   Specimen Description BLOOD RIGHT ANTECUBITAL  Final   Special Requests   Final    BOTTLES DRAWN AEROBIC AND ANAEROBIC Blood Culture results may not be optimal due to an inadequate volume of blood received in culture bottles   Culture   Final    NO GROWTH 5 DAYS Performed at [facility], [address], [place], [place] [address]    Report Status 01/20/2021 FINAL  Final  Blood Culture (routine x 2)     Status: None   Collection Time: 01/15/21  6:49 PM   Specimen: BLOOD  Result Value Ref Range Status   Specimen Description BLOOD BLOOD RIGHT HAND  Final   Special Requests   Final    BOTTLES DRAWN AEROBIC AND ANAEROBIC Blood Culture results may not be optimal due to an inadequate volume of blood received in culture bottles   Culture   Final    NO GROWTH 5 DAYS Performed at [facility], [address], [place], [place] [address]    Report Status 01/20/2021 FINAL  Final  Urine culture     Status: Abnormal   Collection Time: 01/15/21  8:48 PM   Specimen: In/Out Cath Urine  Result Value Ref Range Status   Specimen Description   Final    IN/OUT CATH URINE Performed at [facility], [address] [place], [place] [address]    Special Requests   Final    NONE Performed at [facility], [address] [place], [place] [address]    Culture (A)  Final    <10,000 COLONIES/mL INSIGNIFICANT GROWTH Performed at [facility], [address] [place], [place]  [address]    Report Status 01/17/2021 FINAL  Final  Aerobic Culture w Gram Stain (superficial specimen)     Status: None   Collection Time: 01/16/21  3:40 PM   Specimen: Wound  Result Value Ref Range Status   Specimen Description   Final    WOUND Performed at [facility],  [address] [place], [place] [address]    Special Requests   Final    NONE Performed at [facility], [address] [place], [place] [address]    Gram Stain NO WBC SEEN MODERATE GRAM POSITIVE COCCI   Final   Culture   Final    MODERATE METHICILLIN RESISTANT STAPHYLOCOCCUS AUREUS MODERATE STREPTOCOCCUS PYOGENES Beta hemolytic streptococci are predictably susceptible to penicillin and other beta lactams. Susceptibility testing not routinely performed. Performed at [facility], [address] [place], [place] [address]    Report Status 01/19/2021 FINAL  Final   Organism ID, Bacteria METHICILLIN RESISTANT STAPHYLOCOCCUS AUREUS  Final      Susceptibility   Methicillin resistant staphylococcus aureus - MIC*    CIPROFLOXACIN >=8 RESISTANT Resistant     ERYTHROMYCIN >=8 RESISTANT Resistant     GENTAMICIN <=0.5 SENSITIVE Sensitive     OXACILLIN >=4 RESISTANT Resistant     TETRACYCLINE <=1 SENSITIVE Sensitive     VANCOMYCIN <=0.5 SENSITIVE Sensitive     TRIMETH/SULFA 80 RESISTANT Resistant     CLINDAMYCIN <=0.25 SENSITIVE Sensitive     RIFAMPIN <=0.5 SENSITIVE Sensitive     Inducible Clindamycin NEGATIVE Sensitive     * MODERATE METHICILLIN RESISTANT STAPHYLOCOCCUS AUREUS         Radiology Studies: No results found.      Scheduled Meds:  ARIPiprazole  5 mg Oral Daily   Chlorhexidine Gluconate Cloth  6 each Topical Daily   Chlorhexidine Gluconate Cloth  6 each Topical Q0600   diazepam  5 mg Oral Q6H   enoxaparin (LOVENOX) injection  40 mg Subcutaneous Q24H   linaclotide  290 mcg Oral QAC breakfast   pantoprazole  20 mg Oral Daily   polyethylene glycol  17 g Oral Daily    senna-docusate  2 tablet Oral Daily   Continuous Infusions:  sodium chloride 10 mL/hr at 01/22/21 2212   linezolid (ZYVOX) IV 600 mg (01/24/21 2107)     LOS: 10 days    Time spent: 30 mins     Charise Killian, MD [facility] Pager [phone]  If 7PM-7AM, please contact night-coverage  01/25/2021, 7:29 AM

## 2021-01-25 NOTE — Evaluation (Signed)
Physical Therapy Evaluation Patient Details Name: Kaylee [name] MRN: [id number] DOB: March 27, 1961 Today's Date: 01/25/2021   History of Present Illness  Pt is a 60 y/o F admitted on 01/15/21 with c/c of LLE swelling & redness x 1 week. Pt is being treated for necrotizing fasciitis of LLE with cultures growing MRSA & S. Pyogenes. Pt has had multiple wound debridements & wound vac exchanges (7/7-7/8, 7/11, 7/13, & 7/15). PMH: COPD, bipolar 1, GERD, anxiety/depression, RA  Clinical Impression  Pt seen for PT evaluation with pt pleasant & agreeable to participate. Pt reports she's premedicated but endorses significant pain in LLE that increases with gravity dependent position & limits pt's ability to weight bear in standing. Pt is able to complete bed mobility with hospital bed features, sit>stand & stand pivot with CGA. Pt is unable to weight bear through LLE so completes stand pivot transfer by hopping on RLE. Pt is visibly fatigued & in significant pain after minimal mobility. Educated pt on STR upon d/c to maximize independence with functional mobility & reduce fall risk prior to return home but pt eager to d/c to daughter's house instead, as she's unsure about financing STR - encouraged her to speak with case manager.     Follow Up Recommendations SNF;Supervision/Assistance - 24 hour    Equipment Recommendations  Rolling walker with 5" wheels;3in1 (PT);Wheelchair (measurements PT);Wheelchair cushion (measurements PT) (elevating leg rests for w/c)    Recommendations for Other Services       Precautions / Restrictions Precautions Precautions: Fall Precaution Comments: 2 wound vacs on LLE due to significantly large wound on medially & lateral sides of distal LLE Restrictions Weight Bearing Restrictions: Yes LLE Weight Bearing: Weight bearing as tolerated      Mobility  Bed Mobility Overal bed mobility: Needs Assistance Bed Mobility: Supine to Sit     Supine to sit: Modified  independent (Device/Increase time);HOB elevated     General bed mobility comments: supine>sit    Transfers Overall transfer level: Needs assistance Equipment used: Rolling walker (2 wheeled) Transfers: Sit to/from [employer] Sit to Stand: Supervision;Min guard Stand pivot transfers: Min guard       General transfer comment: cuing for safe hand placement & technique, pt unable to weight bear through LLE to instead maintains NWB LLE & slightly hops on RLE to complete stand pivot bed>recliner, pt with decreased foot clearance RLE when hopping  Ambulation/Gait                Stairs            Wheelchair Mobility    Modified Rankin (Stroke Patients Only)       Balance Overall balance assessment: Needs assistance Sitting-balance support: Feet supported Sitting balance-Leahy Scale: Good       Standing balance-Leahy Scale: Fair Standing balance comment: requires BUE support on RW                             Pertinent Vitals/Pain Pain Assessment: 0-10 Pain Score: 8  Pain Location: LLE Pain Descriptors / Indicators: Grimacing;Aching;Sore Pain Intervention(s): Limited activity within patient's tolerance;Monitored during session;Premedicated before session;Patient requesting pain meds-RN notified    Home Living Family/patient expects to be discharged to:: Private residence (daughter's home) Living Arrangements: Children (plans to d/c to daughter's house with her & pt's 7 grandkids) Available Help at Discharge: Family Type of Home: House Home Access: Stairs to enter Entrance Stairs-Rails: None Entrance Stairs-Number of Steps: 2-3  at back door (pt prefers this entrance over front) Home Layout: One level Home Equipment: None      Prior Function Level of Independence: Independent         Comments: states "I stay on my head" but then reports only 1 fall every 6 months, endorses falling & hitting her head     Hand Dominance         Extremity/Trunk Assessment   Upper Extremity Assessment Upper Extremity Assessment: Overall WFL for tasks assessed    Lower Extremity Assessment Lower Extremity Assessment: Generalized weakness (LLE with 2 wound vacs on medial & lateral aspect of distal extremity, limited ROM 2/2 wound vacs but pt able to lift LLE against gravity in bed, unable to bear weight 2/2 pain & pt with c/o increased pain in gravity dependent position)       Communication   Communication: No difficulties  Cognition Arousal/Alertness: Awake/alert Behavior During Therapy: WFL for tasks assessed/performed Overall Cognitive Status: Within Functional Limits for tasks assessed                                 General Comments: Pleasant & willing to participate      General Comments General comments (skin integrity, edema, etc.): Pt c/o dizziness with standing but reports she feels better once sitting. Pt looks fatigued & in pain after transferring to recliner & confirms this. Educated pt on recommendation of STR upon d/c but pt unsure how to finance this - encouraged her to speak to case manager.    Exercises     Assessment/Plan    PT Assessment Patient needs continued PT services  PT Problem List Decreased strength;Decreased mobility;Decreased balance;Decreased knowledge of use of DME;Pain;Decreased skin integrity;Decreased activity tolerance;Decreased range of motion       PT Treatment Interventions DME instruction;Therapeutic exercise;Gait training;Balance training;Stair training;Therapeutic activities;Patient/family education;Functional mobility training;Neuromuscular re-education;Modalities    PT Goals (Current goals can be found in the Care Plan section)  Acute Rehab PT Goals Patient Stated Goal: go home PT Goal Formulation: With patient Time For Goal Achievement: 02/08/21 Potential to Achieve Goals: Fair    Frequency Min 2X/week   Barriers to discharge Inaccessible home  environment stairs without rails to enter daughter's home    Co-evaluation               AM-PAC PT "6 Clicks" Mobility  Outcome Measure Help needed turning from your back to your side while in a flat bed without using bedrails?: None Help needed moving from lying on your back to sitting on the side of a flat bed without using bedrails?: A Little Help needed moving to and from a bed to a chair (including a wheelchair)?: A Little Help needed standing up from a chair using your arms (e.g., wheelchair or bedside chair)?: A Little Help needed to walk in hospital room?: A Lot Help needed climbing 3-5 steps with a railing? : Total 6 Click Score: 16    End of Session Equipment Utilized During Treatment: Gait belt Activity Tolerance: Patient tolerated treatment well;Patient limited by fatigue;Patient limited by pain Patient left: in chair;with call bell/phone within reach (LLE elevated on 2 pillows) Nurse Communication: Patient requests pain meds;Mobility status PT Visit Diagnosis: Pain;Other abnormalities of gait and mobility (R26.89);Difficulty in walking, not elsewhere classified (R26.2);Muscle weakness (generalized) (M62.81);Unsteadiness on feet (R26.81) Pain - Right/Left: Left Pain - part of body: Leg    Time: 9563-8756 PT Time Calculation (  min) (ACUTE ONLY): 25 min   Charges:   PT Evaluation $PT Eval High Complexity: 1 High PT Treatments $Therapeutic Activity: 8-22 mins        Aleda Grana, PT, DPT 01/25/21, 10:50 AM   Sandi Mariscal 01/25/2021, 10:47 AM

## 2021-01-25 NOTE — Plan of Care (Signed)

## 2021-01-25 NOTE — TOC Progression Note (Signed)
Transition of Care [facility]) - Progression Note    Patient Details  Name: Kaylee [name] MRN: [id number] Date of Birth: 05-Sep-1960  Transition of [facility] CM/SW Contact  Bing Quarry, RN Phone Number: 01/25/2021, 4:43 PM  Clinical Narrative: Spoke with patient re: therapy recommendations for STR at a SNF.  Talkative, alert and oriented. She is wanting a primary goal of going home to her daughter's home at discharge as first choice and also due to financial concerns. Was amenable to looking at provided STR/SNF list and discussing options with her daughter. Daughter works outside the home but has children ages 64-16 that are home-schooled and patient says they would be able to help her. Discussed issues of 24 hour supervision and what means to her daughter's household and patient safety. Permission to began general bed search, but wanted to speak with daughter first about some issues raised in our discussion. Today was patient's first day OOB (according to patient) and is returning to OR Monday for further ID and would vac care/placement. Disposition may change given patient's evolving condition.Daughter is able to help with transportation. Gabriel Cirri RN CM   PCP [facility], [place] - [address] [name] Daughter [phone]  [facility] Medicare.  Gabriel Cirri RN CM        Expected Discharge Plan and Services                                                 Social Determinants of Health (SDOH) Interventions    Readmission Risk Interventions No flowsheet data found.

## 2021-01-25 NOTE — H&P (View-Only) (Signed)
Patient ID: Kaylee [name], female   DOB: 06/08/1961, 60 y.o.   MRN: [id number]     SURGICAL PROGRESS NOTE   Hospital Day(s): 10.   Interval History: Patient seen and examined, no acute events or new complaints overnight. Patient reports feeling a little bit better today.  Pain slowly improving.  She has been able to mobilize her left lower extremity elevated be more.  She can raise it with effort.  No pain radiation.  Aggravating factor is applying pressure to the left lower extremity.  Alleviating factors is pain medications.  No issues with negative pressure dressing.  Vital signs in last 24 hours: [min-max] current  Temp:  [97.5 F (36.4 C)-98.8 F (37.1 C)] 97.8 F (36.6 C) (07/16 0838) Pulse Rate:  [73-87] 73 (07/16 0838) Resp:  [12-18] 18 (07/16 0838) BP: (102-114)/(53-69) 103/59 (07/16 0838) SpO2:  [95 %-100 %] 99 % (07/16 0838)     Height: 5' 6" (167.6 cm) Weight: 65.8 kg BMI (Calculated): 23.42   Physical Exam:  Constitutional: alert, cooperative and no distress  Respiratory: breathing non-labored at rest  Cardiovascular: regular rate and sinus rhythm  Extremity: Left lower extremity with decreased edema.  Negative pressure dressing in place and working adequately no worsening ischemic tissue.  Labs:  CBC Latest Ref Rng & Units 01/25/2021 01/24/2021 01/23/2021  WBC 4.0 - 10.5 K/uL 13.0(H) 18.0(H) 13.5(H)  Hemoglobin 12.0 - 15.0 g/dL 8.2(L) 8.9(L) 8.5(L)  Hematocrit 36.0 - 46.0 % 25.2(L) 27.3(L) 26.4(L)  Platelets 150 - 400 K/uL 549(H) 593(H) 509(H)   CMP Latest Ref Rng & Units 01/25/2021 01/24/2021 01/23/2021  Glucose 70 - 99 mg/dL 106(H) 95 105(H)  BUN 8 - 23 mg/dL 10 10 7(L)  Creatinine 0.44 - 1.00 mg/dL 0.41(L) 0.50 0.40(L)  Sodium 135 - 145 mmol/L 135 134(L) 135  Potassium 3.5 - 5.1 mmol/L 3.9 4.4 3.8  Chloride 98 - 111 mmol/L 101 98 98  CO2 22 - 32 mmol/L 30 31 30  Calcium 8.9 - 10.3 mg/dL 8.0(L) 8.2(L) 8.1(L)  Total Protein 6.5 - 8.1 g/dL - - -  Total Bilirubin 0.3  - 1.2 mg/dL - - -  Alkaline Phos 38 - 126 U/L - - -  AST 15 - 41 U/L - - -  ALT 0 - 44 U/L - - -    Imaging studies: No new pertinent imaging studies   Assessment/Plan:  60 y.o. female with necrotizing fasciitis of the left lower extremity 9 Day Post-Op s/p extensive cleansing and debridement, complicated by pertinent comorbidities including COPD, bipolar type I.  Patient continues to improve very slowly but adequately.  Blood blood cell again continue to decrease.  Negative pressure dressing working adequately.  Edema of the leg also slowly decreasing.  No grossly worsening ischemic tissue.  We will continue with negative pressure dressing in place.  Continue IV antibiotic therapy as per hospitalist and ID.  We will continue to follow closely.  Yahia Bottger Cintrn-Daz, MD    
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
## 2021-01-25 NOTE — Progress Notes (Signed)
Patient ID: Kaylee [name], female   DOB: 01/07/1961, 60 y.o.   MRN: [id number]     SURGICAL PROGRESS NOTE   Hospital Day(s): 10.   Interval History: Patient seen and examined, no acute events or new complaints overnight. Patient reports feeling a little bit better today.  Pain slowly improving.  She has been able to mobilize her left lower extremity elevated be more.  She can raise it with effort.  No pain radiation.  Aggravating factor is applying pressure to the left lower extremity.  Alleviating factors is pain medications.  No issues with negative pressure dressing.  Vital signs in last 24 hours: [min-max] current  Temp:  [97.5 F (36.4 C)-98.8 F (37.1 C)] 97.8 F (36.6 C) (07/16 0838) Pulse Rate:  [73-87] 73 (07/16 0838) Resp:  [12-18] 18 (07/16 0838) BP: (102-114)/(53-69) 103/59 (07/16 0838) SpO2:  [95 %-100 %] 99 % (07/16 0838)     Height: 5\' 6"  (167.6 cm) Weight: 65.8 kg BMI (Calculated): 23.42   Physical Exam:  Constitutional: alert, cooperative and no distress  Respiratory: breathing non-labored at rest  Cardiovascular: regular rate and sinus rhythm  Extremity: Left lower extremity with decreased edema.  Negative pressure dressing in place and working adequately no worsening ischemic tissue.  Labs:  CBC Latest Ref Rng & Units 01/25/2021 01/24/2021 01/23/2021  WBC 4.0 - 10.5 K/uL 13.0(H) 18.0(H) 13.5(H)  Hemoglobin 12.0 - 15.0 g/dL 8.2(L) 8.9(L) 8.5(L)  Hematocrit 36.0 - 46.0 % 25.2(L) 27.3(L) 26.4(L)  Platelets 150 - 400 K/uL 549(H) 593(H) 509(H)   CMP Latest Ref Rng & Units 01/25/2021 01/24/2021 01/23/2021  Glucose 70 - 99 mg/dL 01/25/2021) 95 097(D)  BUN 8 - 23 mg/dL 10 10 7(L)  Creatinine 0.44 - 1.00 mg/dL 532(D) 9.24(Q 6.83)  Sodium 135 - 145 mmol/L 135 134(L) 135  Potassium 3.5 - 5.1 mmol/L 3.9 4.4 3.8  Chloride 98 - 111 mmol/L 101 98 98  CO2 22 - 32 mmol/L 30 31 30   Calcium 8.9 - 10.3 mg/dL 8.0(L) 8.2(L) 8.1(L)  Total Protein 6.5 - 8.1 g/dL - - -  Total Bilirubin 0.3  - 1.2 mg/dL - - -  Alkaline Phos 38 - 126 U/L - - -  AST 15 - 41 U/L - - -  ALT 0 - 44 U/L - - -    Imaging studies: No new pertinent imaging studies   Assessment/Plan:  60 y.o. female with necrotizing fasciitis of the left lower extremity 9 Day Post-Op s/p extensive cleansing and debridement, complicated by pertinent comorbidities including COPD, bipolar type I.  Patient continues to improve very slowly but adequately.  Blood blood cell again continue to decrease.  Negative pressure dressing working adequately.  Edema of the leg also slowly decreasing.  No grossly worsening ischemic tissue.  We will continue with negative pressure dressing in place.  Continue IV antibiotic therapy as per hospitalist and ID.  We will continue to follow closely.  , MD

## 2021-01-26 DIAGNOSIS — D75839 Thrombocytosis, unspecified: Secondary | ICD-10-CM | POA: Diagnosis not present

## 2021-01-26 DIAGNOSIS — M726 Necrotizing fasciitis: Secondary | ICD-10-CM | POA: Diagnosis not present

## 2021-01-26 DIAGNOSIS — D72828 Other elevated white blood cell count: Secondary | ICD-10-CM | POA: Diagnosis not present

## 2021-01-26 LAB — BASIC METABOLIC PANEL
Anion gap: 4 — ABNORMAL LOW (ref 5–15)
BUN: 10 mg/dL (ref 8–23)
CO2: 30 mmol/L (ref 22–32)
Calcium: 8.3 mg/dL — ABNORMAL LOW (ref 8.9–10.3)
Chloride: 100 mmol/L (ref 98–111)
Creatinine, Ser: 0.51 mg/dL (ref 0.44–1.00)
GFR, Estimated: >60 mL/min (ref >60)
Glucose, Bld: 96 mg/dL (ref 70–99)
Potassium: 4.1 mmol/L (ref 3.5–5.1)
Sodium: 134 mmol/L — ABNORMAL LOW (ref 135–145)

## 2021-01-26 LAB — CBC WITH DIFFERENTIAL/PLATELET
Abs Immature Granulocytes: 0.1 10*3/uL — ABNORMAL HIGH (ref 0.00–0.07)
Basophils Absolute: 0.1 10*3/uL (ref 0.0–0.1)
Basophils Relative: 0 %
Eosinophils Absolute: 0.1 10*3/uL (ref 0.0–0.5)
Eosinophils Relative: 1 %
HCT: 24 % — ABNORMAL LOW (ref 36.0–46.0)
Hemoglobin: 7.9 g/dL — ABNORMAL LOW (ref 12.0–15.0)
Immature Granulocytes: 1 %
Lymphocytes Relative: 26 %
Lymphs Abs: 3.6 10*3/uL (ref 0.7–4.0)
MCH: 30.6 pg (ref 26.0–34.0)
MCHC: 32.9 g/dL (ref 30.0–36.0)
MCV: 93 fL (ref 80.0–100.0)
Monocytes Absolute: 0.9 10*3/uL (ref 0.1–1.0)
Monocytes Relative: 7 %
Neutro Abs: 8.9 10*3/uL — ABNORMAL HIGH (ref 1.7–7.7)
Neutrophils Relative %: 65 %
Platelets: 547 10*3/uL — ABNORMAL HIGH (ref 150–400)
RBC: 2.58 MIL/uL — ABNORMAL LOW (ref 3.87–5.11)
RDW: 15.4 % (ref 11.5–15.5)
WBC: 13.7 10*3/uL — ABNORMAL HIGH (ref 4.0–10.5)
nRBC: 0 % (ref 0.0–0.2)

## 2021-01-26 NOTE — Progress Notes (Signed)
PROGRESS NOTE    Kaylee [name]  YBO:[id number] DOB: 07-19-1960 DOA: 01/15/2021 PCP: [name], MD   Assessment & Plan:   Principal Problem:   Cellulitis of left leg Active Problems:   Chronic pain syndrome   GERD (gastroesophageal reflux disease)   Bipolar 1 disorder (HCC)   COPD (chronic obstructive pulmonary disease) (HCC)   Sepsis (HCC)   History of parasitic infection   Necrotizing fasciitis of LLE: s/p wound debridement on 7/7-7/8, 7/11, 7/13 & 7/15 as per general surg. Will likely go back to the OR on 7/19. Intraop cxs growing MRSA & S. Pyogenes. Continue on IV linezolid as per ID  Leukocytosis: labile. Continue on IV abxs as per ID   Thrombocytosis: likely secondary to above infection.  Substance use disorder: urine drug screen on 01/17/21 was positive for amphetamines, benzos, & opiates. Continue w/ supportive care  Substance-induced mood disorder & bipolar depression: severity unknown. Continue on reduced dose of abilify while on linezolid. Continue to hold home dose of celexa while on linezolid. Given Abilify, ID and Dr. Harl Bowie recommended scheduled diazepam to prevent serotonin syndrome while on linezolid. Continue on diazepam   Hypokalemia: WNL today   COPD: w/o exacerbation. Encourage incentive spirometry. Continue on bronchodilators   Hyponatremia: labile. Will continue to monitor    GERD: continue on PPI   Likely subacute blood loss anemia: likely from nec-fasciitis. H&H labile. Will transfuse if Hb < 7.0   DVT prophylaxis: lovenox  Code Status: full Family Communication:  Disposition Plan: PT recs SNF   Level of care: Med-Surg  Status is: Inpatient  Remains inpatient appropriate because:Ongoing diagnostic testing needed not appropriate for outpatient work up, IV treatments appropriate due to intensity of illness or inability to take PO, and Inpatient level of care appropriate due to severity of illness  Dispo: The patient is from: Home               Anticipated d/c is to: Home vs SNF              Patient currently is not medically stable to d/c.   Difficult to place patient Yes        Consultants:  General surg  ID  Procedures:   Antimicrobials: linezolid    Subjective: Pt c/o fatigue   Objective: Vitals:   01/25/21 1611 01/25/21 2043 01/25/21 2328 01/26/21 0558  BP: (!) 105/57 (!) 107/52 (!) 108/48 (!) 111/55  Pulse: 83 90 92 83  Resp: 16 18 18 17   Temp: 98.2 F (36.8 C) 98.8 F (37.1 C)  97.8 F (36.6 C)  TempSrc:      SpO2: 100% 100% 100% 100%  Weight:      Height:        Intake/Output Summary (Last 24 hours) at 01/26/2021 0747 Last data filed at 01/26/2021 0558 Gross per 24 hour  Intake --  Output 3450 ml  Net -3450 ml   Filed Weights   01/15/21 1600 01/20/21 0953 01/24/21 0824  Weight: 65.8 kg 65.8 kg 65.8 kg    Examination:  General exam: Appears calm & comfortable     Respiratory system: clear breath sounds b/l  Cardiovascular system: S1 & S2+. No rubs or clicks   Gastrointestinal system: Abd is soft, NT, ND & hypoactive bowel sounds  Central nervous system: Alert and oriented. Moves all extremities   Psychiatry: judgement and insight appear normal. Flat mood and affect     Data Reviewed: I have personally reviewed following labs and imaging  studies  CBC: Recent Labs  Lab 01/21/21 0437 01/22/21 0508 01/23/21 0656 01/24/21 0638 01/25/21 0504 01/26/21 0458  WBC 24.5* 17.1* 13.5* 18.0* 13.0* 13.7*  NEUTROABS 20.0*  --  10.1* 13.5* 8.5* 8.9*  HGB 8.7* 8.5* 8.5* 8.9* 8.2* 7.9*  HCT 25.7* 25.4* 26.4* 27.3* 25.2* 24.0*  MCV 88.9 91.7 91.0 90.7 90.3 93.0  PLT 423* 412* 509* 593* 549* 547*   Basic Metabolic Panel: Recent Labs  Lab 01/20/21 0609 01/21/21 0437 01/22/21 0508 01/23/21 0656 01/24/21 0638 01/25/21 0504 01/26/21 0458  NA 132*   < > 133* 135 134* 135 134*  K 4.0   < > 4.4 3.8 4.4 3.9 4.1  CL 99   < > 99 98 98 101 100  CO2 28   < > 31 30 31 30 30   GLUCOSE  88   < > 93 105* 95 106* 96  BUN 9   < > 13 7* 10 10 10   CREATININE 0.43*   < > 0.42* 0.40* 0.50 0.41* 0.51  CALCIUM 7.9*   < > 7.9* 8.1* 8.2* 8.0* 8.3*  MG 1.8  --   --   --   --   --   --   PHOS 3.4  --   --   --   --   --   --    < > = values in this interval not displayed.   GFR: Estimated Creatinine Clearance: 69.1 mL/min (by C-G formula based on SCr of 0.51 mg/dL). Liver Function Tests: No results for input(s): AST, ALT, ALKPHOS, BILITOT, PROT, ALBUMIN in the last 168 hours.  No results for input(s): LIPASE, AMYLASE in the last 168 hours. No results for input(s): AMMONIA in the last 168 hours. Coagulation Profile: No results for input(s): INR, PROTIME in the last 168 hours.  Cardiac Enzymes: No results for input(s): CKTOTAL, CKMB, CKMBINDEX, TROPONINI in the last 168 hours.  BNP (last 3 results) No results for input(s): PROBNP in the last 8760 hours. HbA1C: No results for input(s): HGBA1C in the last 72 hours. CBG: Recent Labs  Lab 01/22/21 2159 01/23/21 0718 01/23/21 1225 01/23/21 1702 01/23/21 2130  GLUCAP 124* 99 114* 121* 110*   Lipid Profile: No results for input(s): CHOL, HDL, LDLCALC, TRIG, CHOLHDL, LDLDIRECT in the last 72 hours. Thyroid Function Tests: No results for input(s): TSH, T4TOTAL, FREET4, T3FREE, THYROIDAB in the last 72 hours. Anemia Panel: No results for input(s): VITAMINB12, FOLATE, FERRITIN, TIBC, IRON, RETICCTPCT in the last 72 hours. Sepsis Labs: No results for input(s): PROCALCITON, LATICACIDVEN in the last 168 hours.   Recent Results (from the past 240 hour(s))  Aerobic Culture w Gram Stain (superficial specimen)     Status: None   Collection Time: 01/16/21  3:40 PM   Specimen: Wound  Result Value Ref Range Status   Specimen Description   Final    WOUND Performed at [facility], [address] [place], [address] [place]    Special Requests   Final    NONE Performed at [facility], [address]  [place], [address] [place]    Gram Stain NO WBC SEEN MODERATE GRAM POSITIVE COCCI   Final   Culture   Final    MODERATE METHICILLIN RESISTANT STAPHYLOCOCCUS AUREUS MODERATE STREPTOCOCCUS PYOGENES Beta hemolytic streptococci are predictably susceptible to penicillin and other beta lactams. Susceptibility testing not routinely performed. Performed at [facility], [address] [place], [address] [place]    Report Status 01/19/2021 FINAL  Final   Organism ID, Bacteria METHICILLIN RESISTANT STAPHYLOCOCCUS AUREUS  Final      Susceptibility   Methicillin resistant staphylococcus aureus - MIC*    CIPROFLOXACIN >=8 RESISTANT Resistant     ERYTHROMYCIN >=8 RESISTANT Resistant     GENTAMICIN <=0.5 SENSITIVE Sensitive     OXACILLIN >=4 RESISTANT Resistant     TETRACYCLINE <=1 SENSITIVE Sensitive     VANCOMYCIN <=0.5 SENSITIVE Sensitive     TRIMETH/SULFA 80 RESISTANT Resistant     CLINDAMYCIN <=0.25 SENSITIVE Sensitive     RIFAMPIN <=0.5 SENSITIVE Sensitive     Inducible Clindamycin NEGATIVE Sensitive     * MODERATE METHICILLIN RESISTANT STAPHYLOCOCCUS AUREUS         Radiology Studies: No results found.      Scheduled Meds:  ARIPiprazole  5 mg Oral Daily   Chlorhexidine Gluconate Cloth  6 each Topical Q0600   diazepam  5 mg Oral Q6H   enoxaparin (LOVENOX) injection  40 mg Subcutaneous Q24H   linaclotide  290 mcg Oral QAC breakfast   pantoprazole  20 mg Oral Daily   polyethylene glycol  17 g Oral Daily   senna-docusate  2 tablet Oral Daily   trimethoprim-polymyxin b  1 drop Left Eye Q6H   Continuous Infusions:  sodium chloride 10 mL/hr at 01/22/21 2212   linezolid (ZYVOX) IV 600 mg (01/25/21 2110)     LOS: 11 days    Time spent: 25 mins     Charise Killian, MD [facility] Pager [phone]  If 7PM-7AM, please contact night-coverage  01/26/2021, 7:47 AM

## 2021-01-26 NOTE — TOC Progression Note (Signed)
Transition of Care [facility]) - Progression Note    Patient Details  Name: SAFIRA PROFFIT MRN: [id number] Date of Birth: 27-Jun-1961  Transition of Care [facility]) CM/SW Contact  Bing Quarry, RN Phone Number: 01/26/2021, 3:42 PM  Clinical Narrative:  Attempted to contact patient regarding discussion 7/16 SNF vs HH. Patient wants to go home to daughter's home with [facility] instead of SNF if at all possible. However, patient had not discussed with daughter PT rec. With 24 hour supervision required. Agreeable to SNF bed search in parallel to goal of HH, but also wanted to speak to daughter first. Clovis Cao was completed but not sent pending further discussion between patient and daughter. Gabriel Cirri RN CM   Ashby,[name] (Daughter)  [phone] (Home Phone)       Expected Discharge Plan and Services                                                 Social Determinants of Health (SDOH) Interventions    Readmission Risk Interventions No flowsheet data found.

## 2021-01-26 NOTE — Plan of Care (Signed)
No acute events overnight. Pt's pain well-controlled with current regimen. Problem: Education: Goal: Knowledge of General Education information will improve Description: Including pain rating scale, medication(s)/side effects and non-pharmacologic comfort measures Outcome: Progressing   Problem: Health Behavior/Discharge Planning: Goal: Ability to manage health-related needs will improve Outcome: Progressing   Problem: Clinical Measurements: Goal: Ability to maintain clinical measurements within normal limits will improve Outcome: Progressing Goal: Will remain free from infection Outcome: Progressing Goal: Diagnostic test results will improve Outcome: Progressing Goal: Respiratory complications will improve Outcome: Progressing Goal: Cardiovascular complication will be avoided Outcome: Progressing   Problem: Activity: Goal: Risk for activity intolerance will decrease Outcome: Progressing   Problem: Nutrition: Goal: Adequate nutrition will be maintained Outcome: Progressing   Problem: Coping: Goal: Level of anxiety will decrease Outcome: Progressing   Problem: Elimination: Goal: Will not experience complications related to bowel motility Outcome: Progressing Goal: Will not experience complications related to urinary retention Outcome: Progressing   Problem: Pain Managment: Goal: General experience of comfort will improve Outcome: Progressing   Problem: Safety: Goal: Ability to remain free from injury will improve Outcome: Progressing   Problem: Skin Integrity: Goal: Risk for impaired skin integrity will decrease Outcome: Progressing   Problem: Skin Integrity: Goal: Skin integrity will improve Outcome: Progressing

## 2021-01-27 DIAGNOSIS — M726 Necrotizing fasciitis: Secondary | ICD-10-CM | POA: Diagnosis not present

## 2021-01-27 LAB — CBC WITH DIFFERENTIAL/PLATELET
Abs Immature Granulocytes: 0.08 10*3/uL — ABNORMAL HIGH (ref 0.00–0.07)
Basophils Absolute: 0 10*3/uL (ref 0.0–0.1)
Basophils Relative: 0 %
Eosinophils Absolute: 0.1 10*3/uL (ref 0.0–0.5)
Eosinophils Relative: 1 %
HCT: 24.7 % — ABNORMAL LOW (ref 36.0–46.0)
Hemoglobin: 8 g/dL — ABNORMAL LOW (ref 12.0–15.0)
Immature Granulocytes: 1 %
Lymphocytes Relative: 26 %
Lymphs Abs: 2.6 10*3/uL (ref 0.7–4.0)
MCH: 30.1 pg (ref 26.0–34.0)
MCHC: 32.4 g/dL (ref 30.0–36.0)
MCV: 92.9 fL (ref 80.0–100.0)
Monocytes Absolute: 0.7 10*3/uL (ref 0.1–1.0)
Monocytes Relative: 7 %
Neutro Abs: 6.4 10*3/uL (ref 1.7–7.7)
Neutrophils Relative %: 65 %
Platelets: 589 10*3/uL — ABNORMAL HIGH (ref 150–400)
RBC: 2.66 MIL/uL — ABNORMAL LOW (ref 3.87–5.11)
RDW: 15.7 % — ABNORMAL HIGH (ref 11.5–15.5)
WBC: 9.9 10*3/uL (ref 4.0–10.5)
nRBC: 0 % (ref 0.0–0.2)

## 2021-01-27 LAB — BASIC METABOLIC PANEL
Anion gap: 5 (ref 5–15)
BUN: 12 mg/dL (ref 8–23)
CO2: 27 mmol/L (ref 22–32)
Calcium: 8.2 mg/dL — ABNORMAL LOW (ref 8.9–10.3)
Chloride: 99 mmol/L (ref 98–111)
Creatinine, Ser: 0.52 mg/dL (ref 0.44–1.00)
GFR, Estimated: >60 mL/min (ref >60)
Glucose, Bld: 149 mg/dL — ABNORMAL HIGH (ref 70–99)
Potassium: 3.9 mmol/L (ref 3.5–5.1)
Sodium: 131 mmol/L — ABNORMAL LOW (ref 135–145)

## 2021-01-27 MED ORDER — LINEZOLID 600 MG PO TABS
600.0000 mg | ORAL_TABLET | Freq: Two times a day (BID) | ORAL | Status: AC
  Administered 2021-01-27 – 2021-02-02 (×13): 600 mg via ORAL
  Filled 2021-01-27 (×13): qty 1

## 2021-01-27 NOTE — Progress Notes (Signed)
   Date of Admission:  01/15/2021    ID: Kaylee [name] is a 60 y.o. female  Principal Problem:   Cellulitis of left leg Active Problems:   Chronic pain syndrome   GERD (gastroesophageal reflux disease)   Bipolar 1 disorder (HCC)   COPD (chronic obstructive pulmonary disease) (HCC)   Sepsis (HCC)   History of parasitic infection    Subjective: Doing better Leg less painful Less swollen  Medications:   ARIPiprazole  5 mg Oral Daily   Chlorhexidine Gluconate Cloth  6 each Topical Q0600   diazepam  5 mg Oral Q6H   enoxaparin (LOVENOX) injection  40 mg Subcutaneous Q24H   linaclotide  290 mcg Oral QAC breakfast   pantoprazole  20 mg Oral Daily   polyethylene glycol  17 g Oral Daily   senna-docusate  2 tablet Oral Daily   trimethoprim-polymyxin b  1 drop Left Eye Q6H    Objective: Vital signs in last 24 hours: Temp:  [98 F (36.7 C)-98.6 F (37 C)] 98.2 F (36.8 C) (07/18 0048) Pulse Rate:  [89-90] 90 (07/18 0048) Resp:  [16-18] 18 (07/18 0048) BP: (102-118)/(51-55) 102/51 (07/18 0048) SpO2:  [97 %-100 %] 97 % (07/18 0048)  PHYSICAL EXAM:  General: Alert, cooperative, no distress, appears stated age.  Head: Normocephalic, without obvious abnormality, atraumatic. Eyes: Conjunctivae clear, anicteric sclerae. Pupils are equal ENT Nares normal. No drainage or sinus tenderness. Lips, mucosa, and tongue normal. No Thrush Neck: Supple, symmetrical, no adenopathy, thyroid: non tender no carotid bruit and no JVD. Back: No CVA tenderness. Lungs: Clear to auscultation bilaterally. No Wheezing or Rhonchi. No rales. Heart: Regular rate and rhythm, no murmur, rub or gallop. Abdomen: Soft, non-tender,not distended. Bowel sounds normal. No masses Extremities: left leg- has an extensive I/D over the medial aspect from upper thigh to medial malleolus- also has I/D lateral aspect- Covered with wound vac Skin: No rashes or lesions. Or bruising Lymph: Cervical, supraclavicular  normal. Neurologic: Grossly non-focal  Lab Results Recent Labs    01/26/21 0458 01/27/21 0555  WBC 13.7* 9.9  HGB 7.9* 8.0*  HCT 24.0* 24.7*  NA 134* 131*  K 4.1 3.9  CL 100 99  CO2 30 27  BUN 10 12  CREATININE 0.51 0.52     Assessment/Plan: Necrotizing fascitis secondary to strep pyogenes and MRSA left leg - s/p I/D and has wound vac On Day 12 of antibiotic , day  8 of linezolid-   can change to PO . Will continue for another week- leucocytosis has resolved.  Bipolar disorder- on abilify ( celexa on hold) on valium  While on Linezolid closely observe for any  evidence of serotonin syndrome Parasitophobia  Polysubstance use  Discussed the management with patient

## 2021-01-27 NOTE — Plan of Care (Signed)
No acute events this shift.  Problem: Education: Goal: Knowledge of General Education information will improve Description: Including pain rating scale, medication(s)/side effects and non-pharmacologic comfort measures Outcome: Progressing   Problem: Health Behavior/Discharge Planning: Goal: Ability to manage health-related needs will improve Outcome: Progressing   Problem: Clinical Measurements: Goal: Ability to maintain clinical measurements within normal limits will improve Outcome: Progressing Goal: Will remain free from infection Outcome: Progressing Goal: Diagnostic test results will improve Outcome: Progressing Goal: Respiratory complications will improve Outcome: Progressing Goal: Cardiovascular complication will be avoided Outcome: Progressing   Problem: Activity: Goal: Risk for activity intolerance will decrease Outcome: Progressing   Problem: Nutrition: Goal: Adequate nutrition will be maintained Outcome: Progressing   Problem: Coping: Goal: Level of anxiety will decrease Outcome: Progressing   Problem: Elimination: Goal: Will not experience complications related to bowel motility Outcome: Progressing Goal: Will not experience complications related to urinary retention Outcome: Progressing   Problem: Pain Managment: Goal: General experience of comfort will improve Outcome: Progressing   Problem: Safety: Goal: Ability to remain free from injury will improve Outcome: Progressing   Problem: Skin Integrity: Goal: Risk for impaired skin integrity will decrease Outcome: Progressing   Problem: Skin Integrity: Goal: Skin integrity will improve Outcome: Progressing

## 2021-01-27 NOTE — Progress Notes (Signed)
PROGRESS NOTE    Kaylee [name]  TDD:[id number] DOB: 1961-02-02 DOA: 01/15/2021 PCP: [name], MD   Assessment & Plan:   Principal Problem:   Cellulitis of left leg Active Problems:   Chronic pain syndrome   GERD (gastroesophageal reflux disease)   Bipolar 1 disorder (HCC)   COPD (chronic obstructive pulmonary disease) (HCC)   Sepsis (HCC)   History of parasitic infection   Necrotizing fasciitis of LLE: s/p wound debridement on 7/7-7/8, 7/11, 7/13 & 7/15 as per general surg. Will go back to the OR tomorow. Intraop cxs growing MRSA & S. Pyogenes. Continue on IV linezolid as per ID   Leukocytosis: resolved  Thrombocytosis: likely secondary to above infection   Substance use disorder: urine drug screen on 01/17/21 was positive for amphetamines, benzos, & opiates. Continue w/ supportive care  Substance-induced mood disorder & bipolar depression: severity unknown. Continue on reduced dose of abilify while on linezolid. Continue to hold home dose of celexa while on linezolid. Given Abilify, ID and Dr. Harl Bowie recommended scheduled diazepam to prevent serotonin syndrome while on linezolid. Continue on diazepam   Hypokalemia: within normal limits today    COPD: w/o exacerbation. Encourage incentive spirometry. Continue on bronchodilators   Hyponatremia: labile. Will continue to monitor     GERD: continue on PPI    Likely subacute blood loss anemia: likely from nec-fasciitis. H&H labile. Will transfuse if Hb < 7.0   DVT prophylaxis: lovenox  Code Status: full Family Communication:  Disposition Plan: PT recs SNF   Level of care: Med-Surg  Status is: Inpatient  Remains inpatient appropriate because:Ongoing diagnostic testing needed not appropriate for outpatient work up, IV treatments appropriate due to intensity of illness or inability to take PO, and Inpatient level of care appropriate due to severity of illness  Dispo: The patient is from: Home               Anticipated d/c is to: Home vs SNF              Patient currently is not medically stable to d/c.   Difficult to place patient Yes        Consultants:  General surg  ID  Procedures:   Antimicrobials: linezolid    Subjective: Pt c/o malaise   Objective: Vitals:   01/26/21 0809 01/26/21 1611 01/26/21 2016 01/27/21 0048  BP: (!) 105/57 (!) 103/55 (!) 118/53 (!) 102/51  Pulse: 80 89 89 90  Resp: 18 16 18 18   Temp: 98.6 F (37 C) 98.6 F (37 C) 98 F (36.7 C) 98.2 F (36.8 C)  TempSrc:   Oral Oral  SpO2: 100% 99% 100% 97%  Weight:      Height:        Intake/Output Summary (Last 24 hours) at 01/27/2021 0736 Last data filed at 01/27/2021 0500 Gross per 24 hour  Intake --  Output 4710 ml  Net -4710 ml   Filed Weights   01/15/21 1600 01/20/21 0953 01/24/21 0824  Weight: 65.8 kg 65.8 kg 65.8 kg    Examination:  General exam: Appears comfortable  Respiratory system: clear breath sounds b/l  Cardiovascular system: S1/S2+. No rubs or clicks    Gastrointestinal system: Abd is soft, NT, ND & hypoactive bowel sounds  Central nervous system: Alert and oriented. Moves all extremities  Psychiatry: judgement and insight appear normal. Appropriate mood and affect      Data Reviewed: I have personally reviewed following labs and imaging studies  CBC: Recent Labs  Lab 01/23/21 0656 01/24/21 7673 01/25/21 0504 01/26/21 0458 01/27/21 0555  WBC 13.5* 18.0* 13.0* 13.7* 9.9  NEUTROABS 10.1* 13.5* 8.5* 8.9* 6.4  HGB 8.5* 8.9* 8.2* 7.9* 8.0*  HCT 26.4* 27.3* 25.2* 24.0* 24.7*  MCV 91.0 90.7 90.3 93.0 92.9  PLT 509* 593* 549* 547* 589*   Basic Metabolic Panel: Recent Labs  Lab 01/23/21 0656 01/24/21 0638 01/25/21 0504 01/26/21 0458 01/27/21 0555  NA 135 134* 135 134* 131*  K 3.8 4.4 3.9 4.1 3.9  CL 98 98 101 100 99  CO2 30 31 30 30 27   GLUCOSE 105* 95 106* 96 149*  BUN 7* 10 10 10 12   CREATININE 0.40* 0.50 0.41* 0.51 0.52  CALCIUM 8.1* 8.2* 8.0* 8.3* 8.2*    GFR: Estimated Creatinine Clearance: 69.1 mL/min (by C-G formula based on SCr of 0.52 mg/dL). Liver Function Tests: No results for input(s): AST, ALT, ALKPHOS, BILITOT, PROT, ALBUMIN in the last 168 hours.  No results for input(s): LIPASE, AMYLASE in the last 168 hours. No results for input(s): AMMONIA in the last 168 hours. Coagulation Profile: No results for input(s): INR, PROTIME in the last 168 hours.  Cardiac Enzymes: No results for input(s): CKTOTAL, CKMB, CKMBINDEX, TROPONINI in the last 168 hours.  BNP (last 3 results) No results for input(s): PROBNP in the last 8760 hours. HbA1C: No results for input(s): HGBA1C in the last 72 hours. CBG: Recent Labs  Lab 01/22/21 2159 01/23/21 0718 01/23/21 1225 01/23/21 1702 01/23/21 2130  GLUCAP 124* 99 114* 121* 110*   Lipid Profile: No results for input(s): CHOL, HDL, LDLCALC, TRIG, CHOLHDL, LDLDIRECT in the last 72 hours. Thyroid Function Tests: No results for input(s): TSH, T4TOTAL, FREET4, T3FREE, THYROIDAB in the last 72 hours. Anemia Panel: No results for input(s): VITAMINB12, FOLATE, FERRITIN, TIBC, IRON, RETICCTPCT in the last 72 hours. Sepsis Labs: No results for input(s): PROCALCITON, LATICACIDVEN in the last 168 hours.   No results found for this or any previous visit (from the past 240 hour(s)).        Radiology Studies: No results found.      Scheduled Meds:  ARIPiprazole  5 mg Oral Daily   Chlorhexidine Gluconate Cloth  6 each Topical Q0600   diazepam  5 mg Oral Q6H   enoxaparin (LOVENOX) injection  40 mg Subcutaneous Q24H   linaclotide  290 mcg Oral QAC breakfast   pantoprazole  20 mg Oral Daily   polyethylene glycol  17 g Oral Daily   senna-docusate  2 tablet Oral Daily   trimethoprim-polymyxin b  1 drop Left Eye Q6H   Continuous Infusions:  sodium chloride 10 mL/hr at 01/22/21 2212   linezolid (ZYVOX) IV 600 mg (01/26/21 2154)     LOS: 12 days    Time spent: 26 mins     01/28/21, MD [facility] Pager [phone]  If 7PM-7AM, please contact night-coverage  01/27/2021, 7:36 AM

## 2021-01-27 NOTE — Progress Notes (Signed)
Physical Therapy Treatment Patient Details Name: Kaylee [name] MRN: [id number] DOB: 1961-02-14 Today's Date: 01/27/2021    History of Present Illness Pt is a 60 y/o F admitted on 01/15/21 with c/c of LLE swelling & redness x 1 week. Pt is being treated for necrotizing fasciitis of LLE with cultures growing MRSA & S. Pyogenes. Pt has had multiple wound debridements & wound vac exchanges (7/7-7/8, 7/11, 7/13, & 7/15). PMH: COPD, bipolar 1, GERD, anxiety/depression, RA    PT Comments    Pt seen for PT tx with pt agreeable to tx. Pt reports she is eager to d/c home despite ongoing recommendation of STR upon d/c, stating her family can bump her up the steps in the w/c and her teenage grandson can assist her to [facility] during the day. Pt also reports she has weight bearing restrictions - messaged MD to confirm this because on evaluation MD reported pt is WBAT (still awaiting response from surgeon). Pt declines OOB activity at this time, citing impaired vision (she's waiting on family to bring her contacts from home) and increased pain with LLE in gravity dependent position. PT educated her on benefits of OOB mobility & need to attempt gait as able with RW - pt reports she will try once her contacts get here. Pt performs LLE strengthening exercises with ease with PT providing instructional cuing for technique. Will continue to follow pt acutely to progress transfers & gait as able.    Follow Up Recommendations  SNF;Supervision/Assistance - 24 hour     Equipment Recommendations  Rolling walker with 5" wheels;3in1 (PT);Wheelchair (measurements PT);Wheelchair cushion (measurements PT)    Recommendations for Other Services       Precautions / Restrictions Precautions Precautions: Fall Precaution Comments: 2 wound vacs on LLE due to significantly large wound on medially & lateral sides of distal LLE Restrictions Weight Bearing Restrictions: Yes LLE Weight Bearing: Weight bearing as tolerated     Mobility  Bed Mobility                    Transfers                    Ambulation/Gait                 Stairs             Wheelchair Mobility    Modified Rankin (Stroke Patients Only)       Balance                                            Cognition Arousal/Alertness: Awake/alert Behavior During Therapy: WFL for tasks assessed/performed Overall Cognitive Status: Within Functional Limits for tasks assessed                                 General Comments: Pleasant      Exercises General Exercises - Lower Extremity Quad Sets: AROM;Strengthening;Left;10 reps;Supine Gluteal Sets: AROM;Strengthening;Both;10 reps;Supine Hip ABduction/ADduction: AROM;Strengthening;Left;10 reps;Supine (hip abduction slides & hip adduction pillow squeezes) Hip Flexion/Marching: AROM;Strengthening;Left;10 reps;Supine    General Comments        Pertinent Vitals/Pain Pain Assessment: Faces Faces Pain Scale: Hurts a little bit Pain Location: LLE Pain Descriptors / Indicators: Sore Pain Intervention(s): Monitored during session;Premedicated before session    Home Living  Prior Function            PT Goals (current goals can now be found in the care plan section) Acute Rehab PT Goals Patient Stated Goal: go home PT Goal Formulation: With patient Time For Goal Achievement: 02/08/21 Potential to Achieve Goals: Fair Progress towards PT goals: Progressing toward goals    Frequency    Min 2X/week      PT Plan Current plan remains appropriate    Co-evaluation              AM-PAC PT "6 Clicks" Mobility   Outcome Measure  Help needed turning from your back to your side while in a flat bed without using bedrails?: None Help needed moving from lying on your back to sitting on the side of a flat bed without using bedrails?: A Little Help needed moving to and from a bed to a chair  (including a wheelchair)?: A Little Help needed standing up from a chair using your arms (e.g., wheelchair or bedside chair)?: A Little Help needed to walk in hospital room?: A Lot Help needed climbing 3-5 steps with a railing? : Total 6 Click Score: 16    End of Session   Activity Tolerance: Patient tolerated treatment well Patient left: in bed;with call bell/phone within reach;with bed alarm set (wound vacs intact & plugged in)   PT Visit Diagnosis: Pain;Other abnormalities of gait and mobility (R26.89);Difficulty in walking, not elsewhere classified (R26.2);Muscle weakness (generalized) (M62.81);Unsteadiness on feet (R26.81) Pain - Right/Left: Left Pain - part of body: Leg     Time: 4166-0630 PT Time Calculation (min) (ACUTE ONLY): 15 min  Charges:  $Therapeutic Exercise: 8-22 mins                     Aleda Grana, PT, DPT 01/27/21, 3:55 PM    Sandi Mariscal 01/27/2021, 3:52 PM

## 2021-01-28 ENCOUNTER — Inpatient Hospital Stay: Payer: Medicare Other | Admitting: Anesthesiology

## 2021-01-28 ENCOUNTER — Encounter
Admission: EM | Disposition: A | Discharge status: Home / Self Care | Payer: Self-pay | Source: Home / Self Care | Attending: Internal Medicine

## 2021-01-28 ENCOUNTER — Other Ambulatory Visit (HOSPITAL_COMMUNITY): Payer: Self-pay

## 2021-01-28 ENCOUNTER — Encounter: Payer: Self-pay | Admitting: General Surgery

## 2021-01-28 HISTORY — PX: APPLICATION OF WOUND VAC: SHX5189

## 2021-01-28 LAB — CBC
HCT: 25.2 % — ABNORMAL LOW (ref 36.0–46.0)
Hemoglobin: 8 g/dL — ABNORMAL LOW (ref 12.0–15.0)
MCH: 30 pg (ref 26.0–34.0)
MCHC: 31.7 g/dL (ref 30.0–36.0)
MCV: 94.4 fL (ref 80.0–100.0)
Platelets: 616 10*3/uL — ABNORMAL HIGH (ref 150–400)
RBC: 2.67 MIL/uL — ABNORMAL LOW (ref 3.87–5.11)
RDW: 15.6 % — ABNORMAL HIGH (ref 11.5–15.5)
WBC: 8.8 10*3/uL (ref 4.0–10.5)
nRBC: 0 % (ref 0.0–0.2)

## 2021-01-28 LAB — BASIC METABOLIC PANEL
Anion gap: 5 (ref 5–15)
BUN: 12 mg/dL (ref 8–23)
CO2: 30 mmol/L (ref 22–32)
Calcium: 8.6 mg/dL — ABNORMAL LOW (ref 8.9–10.3)
Chloride: 101 mmol/L (ref 98–111)
Creatinine, Ser: 0.51 mg/dL (ref 0.44–1.00)
GFR, Estimated: >60 mL/min (ref >60)
Glucose, Bld: 114 mg/dL — ABNORMAL HIGH (ref 70–99)
Potassium: 4.2 mmol/L (ref 3.5–5.1)
Sodium: 136 mmol/L (ref 135–145)

## 2021-01-28 LAB — GLUCOSE, CAPILLARY
Glucose-Capillary: 81 mg/dL (ref 70–99)
Glucose-Capillary: 99 mg/dL (ref 70–99)

## 2021-01-28 SURGERY — APPLICATION, WOUND VAC
Anesthesia: General

## 2021-01-28 MED ORDER — MIDAZOLAM HCL 2 MG/2ML IJ SOLN
INTRAMUSCULAR | Status: AC
  Filled 2021-01-28: qty 2

## 2021-01-28 MED ORDER — CHLORHEXIDINE GLUCONATE 0.12 % MT SOLN
15.0000 mL | Freq: Once | OROMUCOSAL | Status: AC
  Administered 2021-01-28: 15 mL via OROMUCOSAL

## 2021-01-28 MED ORDER — FENTANYL CITRATE (PF) 100 MCG/2ML IJ SOLN
25.0000 ug | INTRAMUSCULAR | Status: DC | PRN
  Administered 2021-01-28: 50 ug via INTRAVENOUS

## 2021-01-28 MED ORDER — CHLORHEXIDINE GLUCONATE 0.12 % MT SOLN
OROMUCOSAL | Status: AC
  Administered 2021-01-28: 15 mL
  Filled 2021-01-28: qty 15

## 2021-01-28 MED ORDER — FENTANYL CITRATE (PF) 100 MCG/2ML IJ SOLN
INTRAMUSCULAR | Status: AC
  Filled 2021-01-28: qty 2

## 2021-01-28 MED ORDER — LIDOCAINE HCL (CARDIAC) PF 100 MG/5ML IV SOSY
PREFILLED_SYRINGE | INTRAVENOUS | Status: DC | PRN
  Administered 2021-01-28: 50 mg via INTRAVENOUS

## 2021-01-28 MED ORDER — KETAMINE HCL 10 MG/ML IJ SOLN
INTRAMUSCULAR | Status: DC | PRN
  Administered 2021-01-28 (×2): 20 mg via INTRAVENOUS
  Administered 2021-01-28: 10 mg via INTRAVENOUS

## 2021-01-28 MED ORDER — ACETAMINOPHEN 10 MG/ML IV SOLN
INTRAVENOUS | Status: DC | PRN
  Administered 2021-01-28: 1000 mg via INTRAVENOUS

## 2021-01-28 MED ORDER — FENTANYL CITRATE (PF) 100 MCG/2ML IJ SOLN
INTRAMUSCULAR | Status: DC | PRN
  Administered 2021-01-28 (×2): 50 ug via INTRAVENOUS

## 2021-01-28 MED ORDER — MIDAZOLAM HCL 2 MG/2ML IJ SOLN
INTRAMUSCULAR | Status: DC | PRN
  Administered 2021-01-28: 2 mg via INTRAVENOUS

## 2021-01-28 MED ORDER — KETAMINE HCL 50 MG/5ML IJ SOSY
PREFILLED_SYRINGE | INTRAMUSCULAR | Status: AC
  Filled 2021-01-28: qty 5

## 2021-01-28 MED ORDER — PROPOFOL 10 MG/ML IV BOLUS
INTRAVENOUS | Status: DC | PRN
Start: 2021-01-28 — End: 2021-01-28
  Administered 2021-01-28: 150 mg via INTRAVENOUS

## 2021-01-28 MED ORDER — DEXMEDETOMIDINE (PRECEDEX) IN NS 20 MCG/5ML (4 MCG/ML) IV SYRINGE
PREFILLED_SYRINGE | INTRAVENOUS | Status: DC | PRN
  Administered 2021-01-28: 8 ug via INTRAVENOUS
  Administered 2021-01-28: 4 ug via INTRAVENOUS
  Administered 2021-01-28: 8 ug via INTRAVENOUS

## 2021-01-28 MED ORDER — MEPERIDINE HCL 25 MG/ML IJ SOLN: 6.2500 mg | INTRAMUSCULAR | Status: DC | PRN

## 2021-01-28 MED ORDER — OXYCODONE HCL 5 MG/5ML PO SOLN: 5.0000 mg | Freq: Once | ORAL | Status: AC | PRN

## 2021-01-28 MED ORDER — FENTANYL CITRATE (PF) 100 MCG/2ML IJ SOLN
INTRAMUSCULAR | Status: AC
  Administered 2021-01-28: 50 ug via INTRAVENOUS
  Filled 2021-01-28: qty 2

## 2021-01-28 MED ORDER — HYDROMORPHONE HCL 1 MG/ML IJ SOLN
1.0000 mg | INTRAMUSCULAR | Status: DC | PRN
  Administered 2021-01-28 – 2021-02-02 (×23): 1 mg via INTRAVENOUS
  Filled 2021-01-28 (×24): qty 1

## 2021-01-28 MED ORDER — OXYCODONE HCL 5 MG PO TABS
ORAL_TABLET | ORAL | Status: AC
  Filled 2021-01-28: qty 1

## 2021-01-28 MED ORDER — PROPOFOL 10 MG/ML IV BOLUS
INTRAVENOUS | Status: AC
  Filled 2021-01-28: qty 20

## 2021-01-28 MED ORDER — DEXAMETHASONE SODIUM PHOSPHATE 10 MG/ML IJ SOLN
INTRAMUSCULAR | Status: DC | PRN
  Administered 2021-01-28: 5 mg via INTRAVENOUS

## 2021-01-28 MED ORDER — OXYCODONE HCL 5 MG PO TABS
5.0000 mg | ORAL_TABLET | Freq: Once | ORAL | Status: AC | PRN
  Administered 2021-01-28: 5 mg via ORAL

## 2021-01-28 MED ORDER — PROMETHAZINE HCL 25 MG/ML IJ SOLN: 6.2500 mg | INTRAMUSCULAR | Status: DC | PRN

## 2021-01-28 MED ORDER — LACTATED RINGERS IV SOLN: INTRAVENOUS | Status: DC

## 2021-01-28 SURGICAL SUPPLY — 26 items
CANISTER SUCT 1200ML W/VALVE (MISCELLANEOUS) ×2 IMPLANT
CANISTER WOUND CARE 500ML ATS (WOUND CARE) ×3 IMPLANT
COVER BACK TABLE 80X110 HD (DRAPES) IMPLANT
DRAPE DERMATAC (DRAPES) IMPLANT
DRAPE FOR WOUND VAC UNIT (DRAPES) ×1 IMPLANT
DRAPE LAPAROTOMY 100X77 ABD (DRAPES) ×2 IMPLANT
DRSG VAC ATS LRG SENSATRAC (GAUZE/BANDAGES/DRESSINGS) ×1 IMPLANT
DRSG VAC ATS MED SENSATRAC (GAUZE/BANDAGES/DRESSINGS) ×1 IMPLANT
ELECT REM PT RETURN 9FT ADLT (ELECTROSURGICAL) ×2 IMPLANT
ELECTRODE REM PT RTRN 9FT ADLT (ELECTROSURGICAL) ×1 IMPLANT
GAUZE 4X4 16PLY ~~LOC~~+RFID DBL (SPONGE) ×1 IMPLANT
GLOVE SURG ENC MOIS LTX SZ6.5 (GLOVE) ×6 IMPLANT
GLOVE SURG UNDER POLY LF SZ6.5 (GLOVE) ×3 IMPLANT
GOWN STRL REUS W/ TWL LRG LVL3 (GOWN DISPOSABLE) ×2 IMPLANT
GOWN STRL REUS W/TWL LRG LVL3 (GOWN DISPOSABLE) ×4
KIT TURNOVER KIT A (KITS) ×2 IMPLANT
MANIFOLD NEPTUNE II (INSTRUMENTS) ×2 IMPLANT
NS IRRIG 1000ML POUR BTL (IV SOLUTION) ×1 IMPLANT
PACK BASIN MINOR ARMC (MISCELLANEOUS) ×2 IMPLANT
PACK DRAINAGE HVY KERLIX W/SPG (MISCELLANEOUS) ×1 IMPLANT
PAD ABD DERMACEA PRESS 5X9 (GAUZE/BANDAGES/DRESSINGS) ×2 IMPLANT
PAD NEG PRESSURE SENSATRAC (MISCELLANEOUS) ×1 IMPLANT
SOL PREP PVP 2OZ (MISCELLANEOUS) ×2 IMPLANT
SOLUTION PREP PVP 2OZ (MISCELLANEOUS) ×1 IMPLANT
SPONGE T-LAP 18X18 ~~LOC~~+RFID (SPONGE) ×2 IMPLANT
SUT PROLENE 2 0 FS (SUTURE) ×7 IMPLANT

## 2021-01-28 NOTE — Transfer of Care (Signed)
Immediate Anesthesia Transfer of Care Note  Patient: Kaylee [name]  Procedure(s) Performed: APPLICATION OF WOUND VAC-WOUND VAC EXCHANGE  Patient Location: PACU  Anesthesia Type:General  Level of Consciousness: awake  Airway & Oxygen Therapy: Patient Spontanous Breathing and Patient connected to face mask oxygen  Post-op Assessment: Report given to RN and Post -op Vital signs reviewed and stable  Post vital signs: Reviewed and stable  Last Vitals:  Vitals Value Taken Time  BP 106/63 01/28/21 0852  Temp    Pulse 83 01/28/21 0854  Resp 23 01/28/21 0854  SpO2 99 % 01/28/21 0854  Vitals shown include unvalidated device data.  Last Pain:  Vitals:   01/28/21 0658  TempSrc: Oral  PainSc: 0-No pain      Patients Stated Pain Goal: 2 (01/25/21 0530)  Complications: No notable events documented.

## 2021-01-28 NOTE — Anesthesia Postprocedure Evaluation (Signed)
Anesthesia Post Note  Patient: Kaylee [name]  Procedure(s) Performed: APPLICATION OF WOUND VAC-WOUND VAC EXCHANGE  Patient location during evaluation: PACU Anesthesia Type: General Level of consciousness: awake and alert and oriented Pain management: pain level controlled Vital Signs Assessment: post-procedure vital signs reviewed and stable Respiratory status: spontaneous breathing, nonlabored ventilation and respiratory function stable Cardiovascular status: blood pressure returned to baseline and stable Postop Assessment: no signs of nausea or vomiting Anesthetic complications: no   No notable events documented.   Last Vitals:  Vitals:   01/28/21 0930 01/28/21 0945  BP: 112/67 (!) 110/59  Pulse: 80 76  Resp: (!) 21 16  Temp: 36.4 C 36.5 C  SpO2: 96% 100%    Last Pain:  Vitals:   01/28/21 0930  TempSrc:   PainSc: 5                  Nilson Tabora

## 2021-01-28 NOTE — Op Note (Addendum)
ATTENDING Surgeon(s): Carolan Shiver, MD   ANESTHESIA: General   PRE-OPERATIVE DIAGNOSIS: Left lower extremity necrotizing fasciitis   POST-OPERATIVE DIAGNOSIS: Left lower extremity necrotizing fasciitis   PROCEDURE(S): 1.)  Negative pressure dressing DME placement 2.)  Delayed closure of medial and lateral wounds of the thigh 3.)  Delayed closure of proximal portion of the lateral and medial aspect of leg wound.    INTRAOPERATIVE FINDINGS:  Preoperatively there was a 70 cm long x 30 cm wide open wound Postoperatively there was a 70 cm long x 30 cm wide (2100 sq cm) Partially closure of the wound with negative pressure dressing on lateral and medial aspect of leg measuring 21 cm x 7 cm each side.  Bilateral thigh wounds closed with interrupted sutures.   Today, No new area of debridement. No gross necrotic tissue.        ESTIMATED BLOOD LOSS: 0 mL    SPECIMENS: None   COMPLICATIONS: None apparent   CONDITION AT END OF PROCEDURE: Hemodynamically stable and awake   INDICATIONS FOR PROCEDURE:  Patient is 60 year old female with necrotizing fasciitis. Bedside complex wound vac change not tolerated by patient at this moment due to complexity and pain. Partial closure indicated to simplify wound healing and care.    DETAILS OF PROCEDURE: After informed consent,patient was taken to the OR. Time out performed. General anesthesia induced. Patient placed on supine position. Negative pressure dressing removed. The left lower extremity was cleaned and draped in sterile fashion.  The open wounds were thoroughly irrigated with saline.  No gross purulence or ischemic tissue identified. Hemostasis was achieved.  The lateral and medial wound of the thigh were closed with multiple interrupted 2-0 Prolene sutures. The proxima portion of the lateral and medial aspect of the leg were closed decreasing the size of the wound to 21 cm x 7 cm. I personally placed the complex negative pressure  dressing.  Patient basically has 2 separate negative pressure dressing systems.

## 2021-01-28 NOTE — Interval H&P Note (Signed)
History and Physical Interval Note:  01/28/2021 7:10 AM  Kaylee [name]  has presented today for surgery, with the diagnosis of necrotizing fasciitis with complex wound.  The various methods of treatment have been discussed with the patient and family. After consideration of risks, benefits and other options for treatment, the patient has consented to  Procedure(s): APPLICATION OF WOUND VAC-WOUND VAC EXCHANGE (N/A) as a surgical intervention.  The patient's history has been reviewed, patient examined, no change in status, stable for surgery.  I have reviewed the patient's chart and labs.  Questions were answered to the patient's satisfaction.     Carolan Shiver
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
## 2021-01-28 NOTE — Progress Notes (Signed)
PROGRESS NOTE   HPI was taken from Dr. Para March: Kaylee [name] is a 60 y.o. female with medical history significant for COPD, bipolar 1, GERD, d who presents to the ED with a 1 week history of pain swelling and redness in the left lower extremity.  She denies recent travel.  Denies chest pain or shortness of breath.  Says when the pain started she had a fever and felt disoriented.  Denies any recent injury to the area or any insect bites ED course: On arrival BP 124/73, pulse 96, temp 98.7 with O2 sat 99% on room air.  Blood work significant for leukocytosis of 26,000 with lactic acid 1.8/2.7.  Sodium 131, potassium 3.0.  Urinalysis unremarkable EKG, personally viewed and interpreted sinus at 91 with no acute ST-T wave changes Imaging: Chest x-ray with no acute disease.  Left lower extremity ultrasound negative for DVT   Patient started on IV Rocephin and vancomycin and IV fluid bolus.  Hospitalist consulted for admission.   Review of Systems: As per HPI otherwise all other systems on review of systems negative.   As per Dr. Maryfrances Bunnell: Kaylee [name] is a 60 y.o. F with COPD/smoking, recurrent MRSA infection, chart history of seronegative RA not on meds, and illicit drug use who presented with redness, swelling, and pain in the left lower extremity with diffuse erythema, large necrotic blisters and 10 skin, WBC 30 K, lactic acidosis.  Orthopedics were consulted for necrotizing fasciitis and she was taken to the OR.   Hospital course from Dr. Mayford Knife 7/13-7/19/22: Pt was found to have necrotizing fasciitis of LLE and has been to the OR multiple times for wound debridements and wound vac placements (7/7-7/8, 7/11, 7/13, 7/15,  7/19).  Intraop cxs growing MRSA & S. Pyogenes.  Pt was on IV linezolid and was switched to po linezolid yesterday as per ID. Will likely have to go back to the OR, please see general surg notes. Pt still has wound vacs at this time.      Kaylee [name]   [id number]:[id number] DOB: 03/14/1961 DOA: 01/15/2021 PCP: [name], MD   Assessment & Plan:   Principal Problem:   Cellulitis of left leg Active Problems:   Chronic pain syndrome   GERD (gastroesophageal reflux disease)   Bipolar 1 disorder (HCC)   COPD (chronic obstructive pulmonary disease) (HCC)   Sepsis (HCC)   History of parasitic infection   Necrotizing fasciitis of LLE: s/p wound debridement on 7/7-7/8, 7/11, 7/13, 7/15, 7/19 as per general surg. . Intraop cxs growing MRSA & S. Pyogenes.Switched to po linezolid yesterday as per ID   Leukocytosis: resolved  Thrombocytosis: likely secondary to above infection   Substance use disorder: urine drug screen on 01/17/21 was positive for amphetamines, benzos, & opiates. Continue w/ supportive care  Substance-induced mood disorder & bipolar depression: severity unknown. Continue on reduced dose of abilify while on linezolid. Continue to hold home dose of celexa while on linezolid. Given Abilify, ID and Dr. Harl Bowie recommended scheduled diazepam to prevent serotonin syndrome while on linezolid. Continue on diazepam   Hypokalemia: WNL today     COPD: w/o exacerbation. Continue on bronchodilators and encourage incentive spirometry   Hyponatremia: labile. Will continue to monitor    GERD: continue on PPI    Likely subacute blood loss anemia: likely from nec-fasciitis. H&H are labile. No need for a transfusion currently   DVT prophylaxis: lovenox  Code Status: full Family Communication:  Disposition Plan: PT recs SNF   Level of  care: Med-Surg  Status is: Inpatient  Remains inpatient appropriate because:Ongoing diagnostic testing needed not appropriate for outpatient work up, IV treatments appropriate due to intensity of illness or inability to take PO, and Inpatient level of care appropriate due to severity of illness  Dispo: The patient is from: Home              Anticipated d/c is to: Home vs SNF              Patient  currently is not medically stable to d/c.   Difficult to place patient Yes        Consultants:  General surg  ID  Procedures:   Antimicrobials: linezolid    Subjective: Pt c/o LLE pain   Objective: Vitals:   01/27/21 1555 01/27/21 2024 01/28/21 0504 01/28/21 0658  BP: (!) 106/58 (!) 111/59 103/64 (!) 115/56  Pulse: 84 87 85 99  Resp: 16 20 20 18   Temp: 98.5 F (36.9 C) 98.4 F (36.9 C) 98.2 F (36.8 C) 98.7 F (37.1 C)  TempSrc:   Oral Oral  SpO2: 99% 98% 100% (!) 88%  Weight:      Height:        Intake/Output Summary (Last 24 hours) at 01/28/2021 0819 Last data filed at 01/28/2021 0528 Gross per 24 hour  Intake --  Output 3080 ml  Net -3080 ml   Filed Weights   01/15/21 1600 01/20/21 0953 01/24/21 0824  Weight: 65.8 kg 65.8 kg 65.8 kg    Examination:  General exam: Appears calm but uncomfortable   Respiratory system: clear breath sounds b/l. No rales, wheezes Cardiovascular system: S1 & S2+. No rubs or clicks     Gastrointestinal system: Abd is soft, NT, ND & hypoactive bowel sounds  Central nervous system: Alert and oriented. Moves all extremities  Psychiatry: judgement and insight appear normal. Flat mood and affect     Data Reviewed: I have personally reviewed following labs and imaging studies  CBC: Recent Labs  Lab 01/23/21 0656 01/24/21 0638 01/25/21 0504 01/26/21 0458 01/27/21 0555 01/28/21 0353  WBC 13.5* 18.0* 13.0* 13.7* 9.9 8.8  NEUTROABS 10.1* 13.5* 8.5* 8.9* 6.4  --   HGB 8.5* 8.9* 8.2* 7.9* 8.0* 8.0*  HCT 26.4* 27.3* 25.2* 24.0* 24.7* 25.2*  MCV 91.0 90.7 90.3 93.0 92.9 94.4  PLT 509* 593* 549* 547* 589* 616*   Basic Metabolic Panel: Recent Labs  Lab 01/24/21 0638 01/25/21 0504 01/26/21 0458 01/27/21 0555 01/28/21 0353  NA 134* 135 134* 131* 136  K 4.4 3.9 4.1 3.9 4.2  CL 98 101 100 99 101  CO2 31 30 30 27 30   GLUCOSE 95 106* 96 149* 114*  BUN 10 10 10 12 12   CREATININE 0.50 0.41* 0.51 0.52 0.51  CALCIUM 8.2*  8.0* 8.3* 8.2* 8.6*   GFR: Estimated Creatinine Clearance: 69.1 mL/min (by C-G formula based on SCr of 0.51 mg/dL). Liver Function Tests: No results for input(s): AST, ALT, ALKPHOS, BILITOT, PROT, ALBUMIN in the last 168 hours.  No results for input(s): LIPASE, AMYLASE in the last 168 hours. No results for input(s): AMMONIA in the last 168 hours. Coagulation Profile: No results for input(s): INR, PROTIME in the last 168 hours.  Cardiac Enzymes: No results for input(s): CKTOTAL, CKMB, CKMBINDEX, TROPONINI in the last 168 hours.  BNP (last 3 results) No results for input(s): PROBNP in the last 8760 hours. HbA1C: No results for input(s): HGBA1C in the last 72 hours. CBG: Recent Labs  Lab  01/23/21 0718 01/23/21 1225 01/23/21 1702 01/23/21 2130 01/28/21 0713  GLUCAP 99 114* 121* 110* 81   Lipid Profile: No results for input(s): CHOL, HDL, LDLCALC, TRIG, CHOLHDL, LDLDIRECT in the last 72 hours. Thyroid Function Tests: No results for input(s): TSH, T4TOTAL, FREET4, T3FREE, THYROIDAB in the last 72 hours. Anemia Panel: No results for input(s): VITAMINB12, FOLATE, FERRITIN, TIBC, IRON, RETICCTPCT in the last 72 hours. Sepsis Labs: No results for input(s): PROCALCITON, LATICACIDVEN in the last 168 hours.   No results found for this or any previous visit (from the past 240 hour(s)).        Radiology Studies: No results found.      Scheduled Meds:  [MAR Hold] ARIPiprazole  5 mg Oral Daily   [MAR Hold] Chlorhexidine Gluconate Cloth  6 each Topical Q0600   [MAR Hold] diazepam  5 mg Oral Q6H   [MAR Hold] enoxaparin (LOVENOX) injection  40 mg Subcutaneous Q24H   [MAR Hold] linaclotide  290 mcg Oral QAC breakfast   [MAR Hold] linezolid  600 mg Oral Q12H   [MAR Hold] pantoprazole  20 mg Oral Daily   [MAR Hold] polyethylene glycol  17 g Oral Daily   [MAR Hold] senna-docusate  2 tablet Oral Daily   [MAR Hold] trimethoprim-polymyxin b  1 drop Left Eye Q6H   Continuous  Infusions:  [MAR Hold] sodium chloride 10 mL/hr at 01/22/21 2212   lactated ringers 10 mL/hr at 01/28/21 0729     LOS: 13 days    Time spent: 25 mins     Charise Killian, MD [facility] Pager [phone]  If 7PM-7AM, please contact night-coverage  01/28/2021, 8:19 AM

## 2021-01-29 DIAGNOSIS — M726 Necrotizing fasciitis: Secondary | ICD-10-CM | POA: Diagnosis not present

## 2021-01-29 LAB — CBC
HCT: 24.1 % — ABNORMAL LOW (ref 36.0–46.0)
Hemoglobin: 7.8 g/dL — ABNORMAL LOW (ref 12.0–15.0)
MCH: 29.9 pg (ref 26.0–34.0)
MCHC: 32.4 g/dL (ref 30.0–36.0)
MCV: 92.3 fL (ref 80.0–100.0)
Platelets: 600 10*3/uL — ABNORMAL HIGH (ref 150–400)
RBC: 2.61 MIL/uL — ABNORMAL LOW (ref 3.87–5.11)
RDW: 15.4 % (ref 11.5–15.5)
WBC: 9.5 10*3/uL (ref 4.0–10.5)
nRBC: 0 % (ref 0.0–0.2)

## 2021-01-29 LAB — IRON AND TIBC
Iron: 37 ug/dL (ref 28–170)
Saturation Ratios: 13 % (ref 10.4–31.8)
TIBC: 286 ug/dL (ref 250–450)
UIBC: 249 ug/dL

## 2021-01-29 LAB — BASIC METABOLIC PANEL
Anion gap: 4 — ABNORMAL LOW (ref 5–15)
BUN: 11 mg/dL (ref 8–23)
CO2: 31 mmol/L (ref 22–32)
Calcium: 8.3 mg/dL — ABNORMAL LOW (ref 8.9–10.3)
Chloride: 99 mmol/L (ref 98–111)
Creatinine, Ser: 0.52 mg/dL (ref 0.44–1.00)
GFR, Estimated: >60 mL/min (ref >60)
Glucose, Bld: 127 mg/dL — ABNORMAL HIGH (ref 70–99)
Potassium: 4 mmol/L (ref 3.5–5.1)
Sodium: 134 mmol/L — ABNORMAL LOW (ref 135–145)

## 2021-01-29 LAB — VITAMIN B12: Vitamin B-12: 625 pg/mL (ref 180–914)

## 2021-01-29 LAB — FOLATE: Folate: 6.3 ng/mL (ref >5.9)

## 2021-01-29 LAB — VITAMIN D 25 HYDROXY (VIT D DEFICIENCY, FRACTURES): Vit D, 25-Hydroxy: 36.87 ng/mL (ref 30–100)

## 2021-01-29 NOTE — Progress Notes (Signed)
PROGRESS NOTE   HPI was taken from Dr. Para March: Kaylee [name] is a 60 y.o. female with medical history significant for COPD, bipolar 1, GERD, d who presents to the ED with a 1 week history of pain swelling and redness in the left lower extremity.  She denies recent travel.  Denies chest pain or shortness of breath.  Says when the pain started she had a fever and felt disoriented.  Denies any recent injury to the area or any insect bites ED course: On arrival BP 124/73, pulse 96, temp 98.7 with O2 sat 99% on room air.  Blood work significant for leukocytosis of 26,000 with lactic acid 1.8/2.7.  Sodium 131, potassium 3.0.  Urinalysis unremarkable EKG, personally viewed and interpreted sinus at 91 with no acute ST-T wave changes Imaging: Chest x-ray with no acute disease.  Left lower extremity ultrasound negative for DVT   Patient started on IV Rocephin and vancomycin and IV fluid bolus.  Hospitalist consulted for admission.   Review of Systems: As per HPI otherwise all other systems on review of systems negative.   As per Dr. Maryfrances Bunnell: Kaylee [name] is a 60 y.o. F with COPD/smoking, recurrent MRSA infection, chart history of seronegative RA not on meds, and illicit drug use who presented with redness, swelling, and pain in the left lower extremity with diffuse erythema, large necrotic blisters and 10 skin, WBC 30 K, lactic acidosis.  Orthopedics were consulted for necrotizing fasciitis and she was taken to the OR.   Hospital course from Dr. Mayford Knife 7/13-7/19/22: Pt was found to have necrotizing fasciitis of LLE and has been to the OR multiple times for wound debridements and wound vac placements (7/7-7/8, 7/11, 7/13, 7/15,  7/19).  Intraop cxs growing MRSA & S. Pyogenes.  Pt was on IV linezolid and was switched to po linezolid on 7/18 as per ID.     Kaylee [name]  [id number]:[id number] DOB: May 06, 1961 DOA: 01/15/2021 PCP: [name], MD   Assessment & Plan:   Principal Problem:    Cellulitis of left leg Active Problems:   Chronic pain syndrome   GERD (gastroesophageal reflux disease)   Bipolar 1 disorder (HCC)   COPD (chronic obstructive pulmonary disease) (HCC)   Sepsis (HCC)   History of parasitic infection   Necrotizing fasciitis of LLE: s/p wound debridement on 7/7-7/8, 7/11, 7/13, 7/15, 7/19 as per general surg. . Intraop cxs growing MRSA & S. Pyogenes.Switched to po linezolid yesterday as per ID   Leukocytosis: resolved  Thrombocytosis: likely secondary to above infection   Substance use disorder: urine drug screen on 01/17/21 was positive for amphetamines, benzos, & opiates. Continue w/ supportive care  Substance-induced mood disorder & bipolar depression: severity unknown. Continue on reduced dose of abilify while on linezolid. Continue to hold home dose of celexa while on linezolid. Given Abilify, ID and Dr. Harl Bowie recommended scheduled diazepam to prevent serotonin syndrome while on linezolid. Continue on diazepam   Hypokalemia: WNL today     COPD: w/o exacerbation. Continue on bronchodilators and encourage incentive spirometry   Hyponatremia: labile. Will continue to monitor    GERD: continue on PPI    Likely subacute blood loss anemia: likely from nec-fasciitis. H&H are labile. No need for a transfusion currently   DVT prophylaxis: lovenox  Code Status: full Family Communication:  Disposition Plan: PT recs SNF   Level of care: Med-Surg  Status is: Inpatient  Remains inpatient appropriate because:Ongoing diagnostic testing needed not appropriate for outpatient work up, IV treatments  appropriate due to intensity of illness or inability to take PO, and Inpatient level of care appropriate due to severity of illness  Dispo: The patient is from: Home              Anticipated d/c is to: Home vs SNF              Patient currently is not medically stable to d/c.   Difficult to place patient Yes        Consultants:  General surg   ID  Procedures: s/p wound debridement in the OR multiple times  Antimicrobials: linezolid    Subjective: Pt c/o LLE pain 7/10, denies any other active issues at this time.  Objective: Vitals:   01/28/21 0945 01/28/21 1610 01/29/21 0041 01/29/21 0744  BP: (!) 110/59 (!) 107/56 (!) 91/43 107/62  Pulse: 76 85 79 81  Resp: 16 16 15 16   Temp: 97.7 F (36.5 C) 98.6 F (37 C) 97.9 F (36.6 C) 97.9 F (36.6 C)  TempSrc:      SpO2: 100% 97% 100% 100%  Weight:      Height:        Intake/Output Summary (Last 24 hours) at 01/29/2021 1532 Last data filed at 01/29/2021 1100 Gross per 24 hour  Intake --  Output 1750 ml  Net -1750 ml   Filed Weights   01/15/21 1600 01/20/21 0953 01/24/21 0824  Weight: 65.8 kg 65.8 kg 65.8 kg    Examination:  General exam: Appears calm but uncomfortable   Respiratory system: clear breath sounds b/l. No rales, wheezes Cardiovascular system: S1 & S2+. No rubs or clicks     Gastrointestinal system: Abd is soft, NT, ND & hypoactive bowel sounds  Central nervous system: Alert and oriented. Moves all extremities  Psychiatry: judgement and insight appear normal. Flat mood and affect  LE: Left lower extremity necrotizing fasciitis s/p surgical debridement, wound VAC intact   Data Reviewed: I have personally reviewed following labs and imaging studies  CBC: Recent Labs  Lab 01/23/21 0656 01/24/21 01/26/21 01/25/21 0504 01/26/21 0458 01/27/21 0555 01/28/21 0353 01/29/21 0354  WBC 13.5* 18.0* 13.0* 13.7* 9.9 8.8 9.5  NEUTROABS 10.1* 13.5* 8.5* 8.9* 6.4  --   --   HGB 8.5* 8.9* 8.2* 7.9* 8.0* 8.0* 7.8*  HCT 26.4* 27.3* 25.2* 24.0* 24.7* 25.2* 24.1*  MCV 91.0 90.7 90.3 93.0 92.9 94.4 92.3  PLT 509* 593* 549* 547* 589* 616* 600*   Basic Metabolic Panel: Recent Labs  Lab 01/25/21 0504 01/26/21 0458 01/27/21 0555 01/28/21 0353 01/29/21 0354  NA 135 134* 131* 136 134*  K 3.9 4.1 3.9 4.2 4.0  CL 101 100 99 101 99  CO2 30 30 27 30 31   GLUCOSE  106* 96 149* 114* 127*  BUN 10 10 12 12 11   CREATININE 0.41* 0.51 0.52 0.51 0.52  CALCIUM 8.0* 8.3* 8.2* 8.6* 8.3*   GFR: Estimated Creatinine Clearance: 69.1 mL/min (by C-G formula based on SCr of 0.52 mg/dL). Liver Function Tests: No results for input(s): AST, ALT, ALKPHOS, BILITOT, PROT, ALBUMIN in the last 168 hours.  No results for input(s): LIPASE, AMYLASE in the last 168 hours. No results for input(s): AMMONIA in the last 168 hours. Coagulation Profile: No results for input(s): INR, PROTIME in the last 168 hours.  Cardiac Enzymes: No results for input(s): CKTOTAL, CKMB, CKMBINDEX, TROPONINI in the last 168 hours.  BNP (last 3 results) No results for input(s): PROBNP in the last 8760 hours. HbA1C: No results  for input(s): HGBA1C in the last 72 hours. CBG: Recent Labs  Lab 01/23/21 1225 01/23/21 1702 01/23/21 2130 01/28/21 0713 01/28/21 0913  GLUCAP 114* 121* 110* 81 99   Lipid Profile: No results for input(s): CHOL, HDL, LDLCALC, TRIG, CHOLHDL, LDLDIRECT in the last 72 hours. Thyroid Function Tests: No results for input(s): TSH, T4TOTAL, FREET4, T3FREE, THYROIDAB in the last 72 hours. Anemia Panel: Recent Labs    01/29/21 0354 01/29/21 1021  VITAMINB12  --  625  FOLATE 6.3  --   TIBC 286  --   IRON 37  --    Sepsis Labs: No results for input(s): PROCALCITON, LATICACIDVEN in the last 168 hours.   No results found for this or any previous visit (from the past 240 hour(s)).        Radiology Studies: No results found.      Scheduled Meds:  ARIPiprazole  5 mg Oral Daily   Chlorhexidine Gluconate Cloth  6 each Topical Q0600   diazepam  5 mg Oral Q6H   enoxaparin (LOVENOX) injection  40 mg Subcutaneous Q24H   linaclotide  290 mcg Oral QAC breakfast   linezolid  600 mg Oral Q12H   pantoprazole  20 mg Oral Daily   polyethylene glycol  17 g Oral Daily   senna-docusate  2 tablet Oral Daily   trimethoprim-polymyxin b  1 drop Left Eye Q6H    Continuous Infusions:  sodium chloride 10 mL/hr at 01/22/21 2212   lactated ringers 10 mL/hr at 01/28/21 0729     LOS: 14 days    Time spent: 25 mins     Gillis Santa, MD [facility] Pager [phone]  If 7PM-7AM, please contact night-coverage  01/29/2021, 3:32 PM

## 2021-01-29 NOTE — Progress Notes (Signed)
Patient ID: [name], female   DOB: [birth year], 60 y.o.   MRN: [id number]     SURGICAL PROGRESS NOTE   Hospital Day(s): 14.   Interval History: Patient seen and examined, no acute events or new complaints overnight. Patient reports feeling OK.  Patient does report having pain and swelling of the left leg.  She stated that it is because she has been putting the leg down more often.  Pain controlled with current pain medications.  No pain radiation.  Alleviating factors are premedication.  No aggravating factors identified  Vital signs in last 24 hours: [min-max] current  Temp:  [97.9 F (36.6 C)-98.3 F (36.8 C)] 98.3 F (36.8 C) (07/20 1548) Pulse Rate:  [79-83] 83 (07/20 1548) Resp:  [15-18] 18 (07/20 1548) BP: (91-107)/(43-62) 104/55 (07/20 1548) SpO2:  [100 %] 100 % (07/20 1548)     Height: 5\' 6"  (167.6 cm) Weight: 65.8 kg BMI (Calculated): 23.42   Physical Exam:  Constitutional: alert, cooperative and no distress  Respiratory: breathing non-labored at rest  Cardiovascular: regular rate and sinus rhythm  Extremity: Negative pressure dressing in place.  Today with more edema of the left foot compared to yesterday.  No purulence or sign of ischemic tissue.  Labs:  CBC Latest Ref Rng & Units 01/29/2021 01/28/2021 01/27/2021  WBC 4.0 - 10.5 K/uL 9.5 8.8 9.9  Hemoglobin 12.0 - 15.0 g/dL 7.8(L) 8.0(L) 8.0(L)  Hematocrit 36.0 - 46.0 % 24.1(L) 25.2(L) 24.7(L)  Platelets 150 - 400 K/uL 600(H) 616(H) 589(H)   CMP Latest Ref Rng & Units 01/29/2021 01/28/2021 01/27/2021  Glucose 70 - 99 mg/dL 01/29/2021) 732(K) 025(K)  BUN 8 - 23 mg/dL 11 12 12   Creatinine 0.44 - 1.00 mg/dL 270(W 2.37  Sodium 135 - 145 mmol/L 134(L) 136 131(L)  Potassium 3.5 - 5.1 mmol/L 4.0 4.2 3.9  Chloride 98 - 111 mmol/L 99 101 99  CO2 22 - 32 mmol/L 31 30 27   Calcium 8.9 - 10.3 mg/dL 8.3(L) 8.6(L) 8.2(L)  Total Protein 6.5 - 8.1 g/dL - - -  Total Bilirubin 0.3 - 1.2 mg/dL - - -  Alkaline Phos 38 - 126 U/L - - -   AST 15 - 41 U/L - - -  ALT 0 - 44 U/L - - -    Imaging studies: No new pertinent imaging studies   Assessment/Plan:  60 y.o. female with necrotizing fasciitis of the left lower extremity 13 Day Post-Op s/p extensive cleansing and debridement, complicated by pertinent comorbidities including COPD, bipolar type I.  Patient without any clinical deterioration.  She does have more swelling of the left leg.  I recommend to keep the leg elevated while resting.  No contraindication for physical therapy otherwise.  Patient continue with normal white blood cell count.  Patient responding well to debridement, negative pressure dressing and antibiotic therapy.  The next negative pressure dressing change will be on Friday.  Patient still not tolerating bedside negative pressure dressing.  Will need to take her to the emergency room.  Appreciate hospitalist and ID management of medical comorbidities and antibiotic therapy.  We will continue to follow closely.  , MD

## 2021-01-29 NOTE — Progress Notes (Signed)
   Date of Admission:  01/15/2021       Subjective: Pt doing better Yesterday had partial secondary closure of the thigh I/D   Medications:   ARIPiprazole  5 mg Oral Daily   Chlorhexidine Gluconate Cloth  6 each Topical Q0600   diazepam  5 mg Oral Q6H   enoxaparin (LOVENOX) injection  40 mg Subcutaneous Q24H   linaclotide  290 mcg Oral QAC breakfast   linezolid  600 mg Oral Q12H   pantoprazole  20 mg Oral Daily   polyethylene glycol  17 g Oral Daily   senna-docusate  2 tablet Oral Daily   trimethoprim-polymyxin b  1 drop Left Eye Q6H    Objective: Vital signs in last 24 hours: Temp:  [97.9 F (36.6 C)-98.6 F (37 C)] 97.9 F (36.6 C) (07/20 0744) Pulse Rate:  [79-85] 81 (07/20 0744) Resp:  [15-16] 16 (07/20 0744) BP: (91-107)/(43-62) 107/62 (07/20 0744) SpO2:  [97 %-100 %] 100 % (07/20 0744)  PHYSICAL EXAM:  General: Alert, cooperative, no distress, appears stated age.  Head: Normocephalic, without obvious abnormality, atraumatic. Eyes: Conjunctivae clear, anicteric sclerae. Pupils are equal ENT Nares normal. No drainage or sinus tenderness. Lips, mucosa, and tongue normal. No Thrush Neck: Supple, symmetrical, no adenopathy, thyroid: non tender no carotid bruit and no JVD. Back: No CVA tenderness. Lungs: Clear to auscultation bilaterally. No Wheezing or Rhonchi. No rales. Heart: Regular rate and rhythm, no murmur, rub or gallop. Abdomen: Soft, non-tender,not distended. Bowel sounds normal. No masses Extremities:      Skin: No rashes or lesions. Or bruising Lymph: Cervical, supraclavicular normal. Neurologic: Grossly non-focal  Lab Results Recent Labs    01/28/21 0353 01/29/21 0354  WBC 8.8 9.5  HGB 8.0* 7.8*  HCT 25.2* 24.1*  NA 136 134*  K 4.2 4.0  CL 101 99  CO2 30 31  BUN 12 11  CREATININE 0.51 0.52     Assessment/Plan: Necrotizing fascitis due to Group A strep and MRSA- on linezolid - day 10 - ( day 14 of antibiotic) will do another 4  days  BIPOLAR disorder on abilify ( celexa on hold) also on valium  Polysubstance use  Discussed with care team

## 2021-01-30 LAB — PHOSPHORUS: Phosphorus: 4.4 mg/dL (ref 2.5–4.6)

## 2021-01-30 LAB — BASIC METABOLIC PANEL
Anion gap: 7 (ref 5–15)
BUN: 16 mg/dL (ref 8–23)
CO2: 27 mmol/L (ref 22–32)
Calcium: 8.2 mg/dL — ABNORMAL LOW (ref 8.9–10.3)
Chloride: 103 mmol/L (ref 98–111)
Creatinine, Ser: 0.51 mg/dL (ref 0.44–1.00)
GFR, Estimated: >60 mL/min (ref >60)
Glucose, Bld: 91 mg/dL (ref 70–99)
Potassium: 4 mmol/L (ref 3.5–5.1)
Sodium: 137 mmol/L (ref 135–145)

## 2021-01-30 LAB — CBC
HCT: 23.8 % — ABNORMAL LOW (ref 36.0–46.0)
Hemoglobin: 7.4 g/dL — ABNORMAL LOW (ref 12.0–15.0)
MCH: 29.7 pg (ref 26.0–34.0)
MCHC: 31.1 g/dL (ref 30.0–36.0)
MCV: 95.6 fL (ref 80.0–100.0)
Platelets: 541 10*3/uL — ABNORMAL HIGH (ref 150–400)
RBC: 2.49 MIL/uL — ABNORMAL LOW (ref 3.87–5.11)
RDW: 15.6 % — ABNORMAL HIGH (ref 11.5–15.5)
WBC: 8.7 10*3/uL (ref 4.0–10.5)
nRBC: 0 % (ref 0.0–0.2)

## 2021-01-30 LAB — MAGNESIUM: Magnesium: 1.8 mg/dL (ref 1.7–2.4)

## 2021-01-30 MED ORDER — ASCORBIC ACID 500 MG PO TABS
500.0000 mg | ORAL_TABLET | Freq: Two times a day (BID) | ORAL | Status: DC
  Administered 2021-01-30 – 2021-02-04 (×11): 500 mg via ORAL
  Filled 2021-01-30 (×11): qty 1

## 2021-01-30 MED ORDER — SODIUM CHLORIDE 0.9 % IV SOLN: INTRAVENOUS | Status: AC

## 2021-01-30 MED ORDER — POLYSACCHARIDE IRON COMPLEX 150 MG PO CAPS
150.0000 mg | ORAL_CAPSULE | Freq: Every day | ORAL | Status: DC
  Administered 2021-01-30 – 2021-02-03 (×5): 150 mg via ORAL
  Filled 2021-01-30 (×6): qty 1

## 2021-01-30 MED ORDER — JUVEN PO PACK
1.0000 | PACK | Freq: Two times a day (BID) | ORAL | Status: DC
  Administered 2021-01-30 – 2021-02-04 (×8): 1 via ORAL

## 2021-01-30 MED ORDER — ENSURE MAX PROTEIN PO LIQD
11.0000 [oz_av] | Freq: Every day | ORAL | Status: DC
  Administered 2021-01-30 – 2021-02-04 (×4): 11 [oz_av] via ORAL
  Filled 2021-01-30: qty 330

## 2021-01-30 NOTE — Progress Notes (Signed)
Physical Therapy Treatment Patient Details Name: Kaylee [name] MRN: [id number] DOB: Oct 08, 1960 Today's Date: 01/30/2021    History of Present Illness Pt is a 60 y/o F admitted on 01/15/21 with c/c of LLE swelling & redness x 1 week. Pt is being treated for necrotizing fasciitis of LLE with cultures growing MRSA & S. Pyogenes. Pt has had multiple wound debridements & wound vac exchanges.  PMH: COPD, bipolar 1, GERD, anxiety/depression, RA.    PT Comments    Pt resting in bed upon PT arrival and requesting to use the [facility] for toileting.  Modified independent with bed mobility and SBA to CGA with transfers (with RW) bed to/from [facility].  Pt kept L LE NWB'ing during sessions activities.  Pt declined ambulation d/t increasing L LE pain in dependent position; pt also declined any LE ex's.  Pt reports plan to go back to OR tomorrow.  Will continue to focus on strengthening and progressive functional mobility as able.      Follow Up Recommendations  SNF;Supervision/Assistance - 24 hour     Equipment Recommendations  Rolling walker with 5" wheels;3in1 (PT);Wheelchair (measurements PT);Wheelchair cushion (measurements PT)    Recommendations for Other Services       Precautions / Restrictions Precautions Precautions: Fall Precaution Comments: 2 wound vacs on LLE due to significantly large wound on medially & lateral sides of distal LLE Restrictions Weight Bearing Restrictions: Yes LLE Weight Bearing: Weight bearing as tolerated    Mobility  Bed Mobility Overal bed mobility: Modified Independent Bed Mobility: Supine to Sit;Sit to Supine     Supine to sit: Modified independent (Device/Increase time);HOB elevated Sit to supine: Modified independent (Device/Increase time);HOB elevated   General bed mobility comments: pt cautious with L LE    Transfers Overall transfer level: Needs assistance Equipment used: Rolling walker (2 wheeled) Transfers: Sit to/from [employer] Sit  to Stand: Supervision;Min guard Stand pivot transfers: Min guard       General transfer comment: fairly strong stand from bed and from recliner with RW use; stand/hop (on R LE) to stand pivot bed to/from BSC; decreased R LE foot clearance with hopping; pt keeping L LE NWB'ing  Ambulation/Gait             General Gait Details: pt declined d/t L LE painful in dependent position   Stairs             Wheelchair Mobility    Modified Rankin (Stroke Patients Only)       Balance Overall balance assessment: Needs assistance Sitting-balance support: No upper extremity supported;Feet unsupported Sitting balance-Leahy Scale: Good Sitting balance - Comments: steady sitting reaching within BOS   Standing balance support: Single extremity supported Standing balance-Leahy Scale: Fair Standing balance comment: steady standing with at least single UE support                            Cognition Arousal/Alertness: Awake/alert Behavior During Therapy: WFL for tasks assessed/performed Overall Cognitive Status: Within Functional Limits for tasks assessed                                        Exercises      General Comments  Nursing cleared pt for participation in physical therapy.  Pt agreeable to limited PT session.      Pertinent Vitals/Pain Pain Assessment: Faces Faces Pain Scale:  Hurts little more (6/10 with activity) Pain Location: LLE Pain Descriptors / Indicators: Sore Pain Intervention(s): Limited activity within patient's tolerance;Monitored during session;Premedicated before session;Repositioned    Home Living                      Prior Function            PT Goals (current goals can now be found in the care plan section) Acute Rehab PT Goals Patient Stated Goal: go home PT Goal Formulation: With patient Time For Goal Achievement: 02/08/21 Potential to Achieve Goals: Fair Progress towards PT goals: Progressing toward  goals    Frequency    Min 2X/week      PT Plan Current plan remains appropriate    Co-evaluation              AM-PAC PT "6 Clicks" Mobility   Outcome Measure  Help needed turning from your back to your side while in a flat bed without using bedrails?: None Help needed moving from lying on your back to sitting on the side of a flat bed without using bedrails?: None Help needed moving to and from a bed to a chair (including a wheelchair)?: A Little Help needed standing up from a chair using your arms (e.g., wheelchair or bedside chair)?: A Little Help needed to walk in hospital room?: A Lot Help needed climbing 3-5 steps with a railing? : Total 6 Click Score: 17    End of Session   Activity Tolerance: Patient tolerated treatment well Patient left: in bed;with call bell/phone within reach;with bed alarm set;Other (comment) (L LE elevated via pillows) Nurse Communication: Patient requests pain meds;Mobility status PT Visit Diagnosis: Pain;Other abnormalities of gait and mobility (R26.89);Difficulty in walking, not elsewhere classified (R26.2);Muscle weakness (generalized) (M62.81);Unsteadiness on feet (R26.81) Pain - Right/Left: Left Pain - part of body: Leg     Time: 7209-4709 PT Time Calculation (min) (ACUTE ONLY): 24 min  Charges:  $Therapeutic Activity: 23-37 mins                     Hendricks Limes, PT 01/30/21, 1:35 PM

## 2021-01-30 NOTE — Progress Notes (Signed)
PROGRESS NOTE   HPI was taken from Dr. Para March: Kaylee [name] is a 60 y.o. female with medical history significant for COPD, bipolar 1, GERD, d who presents to the ED with a 1 week history of pain swelling and redness in the left lower extremity.  She denies recent travel.  Denies chest pain or shortness of breath.  Says when the pain started she had a fever and felt disoriented.  Denies any recent injury to the area or any insect bites ED course: On arrival BP 124/73, pulse 96, temp 98.7 with O2 sat 99% on room air.  Blood work significant for leukocytosis of 26,000 with lactic acid 1.8/2.7.  Sodium 131, potassium 3.0.  Urinalysis unremarkable EKG, personally viewed and interpreted sinus at 91 with no acute ST-T wave changes Imaging: Chest x-ray with no acute disease.  Left lower extremity ultrasound negative for DVT   Patient started on IV Rocephin and vancomycin and IV fluid bolus.  Hospitalist consulted for admission.   Review of Systems: As per HPI otherwise all other systems on review of systems negative.   As per Dr. Maryfrances Bunnell: Kaylee [name] is a 60 y.o. F with COPD/smoking, recurrent MRSA infection, chart history of seronegative RA not on meds, and illicit drug use who presented with redness, swelling, and pain in the left lower extremity with diffuse erythema, large necrotic blisters and 10 skin, WBC 30 K, lactic acidosis.  Orthopedics were consulted for necrotizing fasciitis and she was taken to the OR.   Hospital course from Dr. Mayford Knife 7/13-7/19/22: Pt was found to have necrotizing fasciitis of LLE and has been to the OR multiple times for wound debridements and wound vac placements (7/7-7/8, 7/11, 7/13, 7/15,  7/19).  Intraop cxs growing MRSA & S. Pyogenes.  Pt was on IV linezolid and was switched to po linezolid on 7/18 as per ID.     Kaylee [name]  TMH:[id number] DOB: 01/14/1961 DOA: 01/15/2021 PCP: [name], MD   Assessment & Plan:   Principal Problem:    Cellulitis of left leg Active Problems:   Chronic pain syndrome   GERD (gastroesophageal reflux disease)   Bipolar 1 disorder (HCC)   COPD (chronic obstructive pulmonary disease) (HCC)   Sepsis (HCC)   History of parasitic infection   Necrotizing fasciitis of LLE: s/p wound debridement on 7/7-7/8, 7/11, 7/13, 7/15, 7/19 as per general surg. . Intraop cxs growing MRSA & S. Pyogenes.Switched to po linezolid as per ID  next negative pressure dressing change will be on Friday 7/22 as per sx  Leukocytosis: resolved  Thrombocytosis: likely secondary to above infection   Substance use disorder: urine drug screen on 01/17/21 was positive for amphetamines, benzos, & opiates. Continue w/ supportive care  Substance-induced mood disorder & bipolar depression: severity unknown. Continue on reduced dose of abilify while on linezolid. Continue to hold home dose of celexa while on linezolid. Given Abilify, ID and Dr. Harl Bowie recommended scheduled diazepam to prevent serotonin syndrome while on linezolid. Continue on diazepam   Hypokalemia: WNL today     COPD: w/o exacerbation. Continue on bronchodilators and encourage incentive spirometry   Hyponatremia: labile. Will continue to monitor    GERD: continue on PPI    Likely subacute blood loss anemia: likely from nec-fasciitis. H&H are labile. No need for a transfusion currently   DVT prophylaxis: lovenox  Code Status: full Family Communication:  Disposition Plan: PT recs SNF   Level of care: Med-Surg  Status is: Inpatient  Remains inpatient appropriate  because:Ongoing diagnostic testing needed not appropriate for outpatient work up, IV treatments appropriate due to intensity of illness or inability to take PO, and Inpatient level of care appropriate due to severity of illness  Dispo: The patient is from: Home              Anticipated d/c is to: Home vs SNF              Patient currently is not medically stable to d/c.   Difficult to place  patient Yes        Consultants:  General surg  ID  Procedures: s/p wound debridement in the OR multiple times  Antimicrobials: linezolid    Subjective: Pt c/o LLE pain 7/10, denies any other active issues at this time.  Objective: Vitals:   01/30/21 0022 01/30/21 0352 01/30/21 0736 01/30/21 1211  BP: (!) 107/59 (!) 100/58 (!) 95/49 (!) 97/55  Pulse: 86 83 82 85  Resp:  20 17 18   Temp:  98.3 F (36.8 C) 98.6 F (37 C) 99.1 F (37.3 C)  TempSrc:   Oral   SpO2:  100% 100% 100%  Weight:      Height:        Intake/Output Summary (Last 24 hours) at 01/30/2021 1422 Last data filed at 01/30/2021 1200 Gross per 24 hour  Intake --  Output 3650 ml  Net -3650 ml   Filed Weights   01/15/21 1600 01/20/21 0953 01/24/21 0824  Weight: 65.8 kg 65.8 kg 65.8 kg    Examination:  General exam: Appears calm but uncomfortable   Respiratory system: clear breath sounds b/l. No rales, wheezes Cardiovascular system: S1 & S2+. No rubs or clicks     Gastrointestinal system: Abd is soft, NT, ND & hypoactive bowel sounds  Central nervous system: Alert and oriented. Moves all extremities  Psychiatry: judgement and insight appear normal. Flat mood and affect  LE: Left lower extremity necrotizing fasciitis s/p surgical debridement, wound VAC intact   Data Reviewed: I have personally reviewed following labs and imaging studies  CBC: Recent Labs  Lab 01/24/21 0638 01/25/21 0504 01/26/21 0458 01/27/21 0555 01/28/21 0353 01/29/21 0354 01/30/21 0332  WBC 18.0* 13.0* 13.7* 9.9 8.8 9.5 8.7  NEUTROABS 13.5* 8.5* 8.9* 6.4  --   --   --   HGB 8.9* 8.2* 7.9* 8.0* 8.0* 7.8* 7.4*  HCT 27.3* 25.2* 24.0* 24.7* 25.2* 24.1* 23.8*  MCV 90.7 90.3 93.0 92.9 94.4 92.3 95.6  PLT 593* 549* 547* 589* 616* 600* 541*   Basic Metabolic Panel: Recent Labs  Lab 01/26/21 0458 01/27/21 0555 01/28/21 0353 01/29/21 0354 01/30/21 0332  NA 134* 131* 136 134* 137  K 4.1 3.9 4.2 4.0 4.0  CL 100 99 101  99 103  CO2 30 27 30 31 27   GLUCOSE 96 149* 114* 127* 91  BUN 10 12 12 11 16   CREATININE 0.51 0.52 0.51 0.52 0.51  CALCIUM 8.3* 8.2* 8.6* 8.3* 8.2*  MG  --   --   --   --  1.8  PHOS  --   --   --   --  4.4   GFR: Estimated Creatinine Clearance: 69.1 mL/min (by C-G formula based on SCr of 0.51 mg/dL). Liver Function Tests: No results for input(s): AST, ALT, ALKPHOS, BILITOT, PROT, ALBUMIN in the last 168 hours.  No results for input(s): LIPASE, AMYLASE in the last 168 hours. No results for input(s): AMMONIA in the last 168 hours. Coagulation Profile: No results for  input(s): INR, PROTIME in the last 168 hours.  Cardiac Enzymes: No results for input(s): CKTOTAL, CKMB, CKMBINDEX, TROPONINI in the last 168 hours.  BNP (last 3 results) No results for input(s): PROBNP in the last 8760 hours. HbA1C: No results for input(s): HGBA1C in the last 72 hours. CBG: Recent Labs  Lab 01/23/21 1702 01/23/21 2130 01/28/21 0713 01/28/21 0913  GLUCAP 121* 110* 81 99   Lipid Profile: No results for input(s): CHOL, HDL, LDLCALC, TRIG, CHOLHDL, LDLDIRECT in the last 72 hours. Thyroid Function Tests: No results for input(s): TSH, T4TOTAL, FREET4, T3FREE, THYROIDAB in the last 72 hours. Anemia Panel: Recent Labs    01/29/21 0354 01/29/21 1021  VITAMINB12  --  625  FOLATE 6.3  --   TIBC 286  --   IRON 37  --    Sepsis Labs: No results for input(s): PROCALCITON, LATICACIDVEN in the last 168 hours.   No results found for this or any previous visit (from the past 240 hour(s)).        Radiology Studies: No results found.      Scheduled Meds:  ARIPiprazole  5 mg Oral Daily   vitamin C  500 mg Oral BID   Chlorhexidine Gluconate Cloth  6 each Topical Q0600   diazepam  5 mg Oral Q6H   enoxaparin (LOVENOX) injection  40 mg Subcutaneous Q24H   iron polysaccharides  150 mg Oral Daily   linaclotide  290 mcg Oral QAC breakfast   linezolid  600 mg Oral Q12H   pantoprazole  20 mg  Oral Daily   polyethylene glycol  17 g Oral Daily   senna-docusate  2 tablet Oral Daily   trimethoprim-polymyxin b  1 drop Left Eye Q6H   Continuous Infusions:  sodium chloride 10 mL/hr at 01/22/21 2212   sodium chloride 75 mL/hr at 01/30/21 1610   lactated ringers 10 mL/hr at 01/28/21 0729     LOS: 15 days    Time spent: 25 mins     Gillis Santa, MD [facility] Pager [phone]  If 7PM-7AM, please contact night-coverage  01/30/2021, 2:22 PM

## 2021-01-30 NOTE — Progress Notes (Signed)
Initial Nutrition Assessment  DOCUMENTATION CODES:  Not applicable  INTERVENTION:  Continue current diet as ordered, encourage PO intake 1 packet Juven BID, each packet provides 95 calories, 2.5 grams of protein (collagen), and 9.8 grams of carbohydrate (3 grams sugar); also contains 7 grams of L-arginine and L-glutamine, 300 mg vitamin C, 15 mg vitamin E, 1.2 mcg vitamin B-12, 9.5 mg zinc, 200 mg calcium, and 1.5 g  Calcium Beta-hydroxy-Beta-methylbutyrate to support wound healing Ensure Max po BID, each supplement provides 150 kcal and 30 grams of protein.   NUTRITION DIAGNOSIS:  Increased nutrient needs related to wound healing as evidenced by estimated needs.  GOAL:  Patient will meet greater than or equal to 90% of their needs  MONITOR:  PO intake, Supplement acceptance, Skin, Weight trends  REASON FOR ASSESSMENT:  LOS    ASSESSMENT:  Pt presented to ED with swelling and redness to her LLE worsening over the previous week. PMH relevant for GERD, HLD, IBS, and COPD  Imaging in ED showed necrotizing fascitis and pt found to be septic. Surgery consulted and pt has undergone several I&D of wounds this admission with wound vacs in place.   7/7 - Op, excisional debridement of left lower extremity 7/8 - Op, excisional debridement of left lower extremity, wound vac x 2 applied in OR. 7/11 - Op, excisional debridement of left lower extremity, wound vac x 2 changed 7/13 - wound vacs changed in OR 7/15 - wound vacs changed in OR 7/19 - wound vacs changed in OR  Pt resting in bed at the time of visit. Wound vacs in place with significant amount of drainage. States that she has been eating well this admission. Reports that providers have talked with her about the importance of protein for wound healing, has been eating 3 yogurts each day in addition to her meals. Agreeable to Juven and ensure max.  Pt very concerned with not gaining weight this admission. Educated on the importance of  providing body with enough energy to promote wound healing.   Average Meal Intake: 7/6-7/10: 84% intake x 4 recorded meals, no meals recorded since  Nutritionally Relevant Medications: Scheduled Meds:  vitamin C  500 mg Oral BID   iron polysaccharides  150 mg Oral Daily   linaclotide  290 mcg Oral QAC breakfast   linezolid  600 mg Oral Q12H   pantoprazole  20 mg Oral Daily   polyethylene glycol  17 g Oral Daily   senna-docusate  2 tablet Oral Daily   Continuous Infusions:  sodium chloride 75 mL/hr at 01/30/21 0917   PRN Meds: ondansetron  Labs Reviewed  NUTRITION - FOCUSED PHYSICAL EXAM: Flowsheet Row Most Recent Value  Orbital Region No depletion  Upper Arm Region No depletion  Thoracic and Lumbar Region No depletion  Buccal Region No depletion  Temple Region No depletion  Clavicle Bone Region No depletion  Clavicle and Acromion Bone Region No depletion  Scapular Bone Region No depletion  Dorsal Hand No depletion  Patellar Region No depletion  Anterior Thigh Region No depletion  Posterior Calf Region No depletion  Edema (RD Assessment) None  Hair Reviewed  Eyes Reviewed  Mouth Reviewed  Skin Reviewed  Nails Reviewed   Diet Order:   Diet Order             Diet NPO time specified  Diet effective midnight           Diet regular Room service appropriate? Yes; Fluid consistency: Thin  Diet effective now  EDUCATION NEEDS:  No education needs have been identified at this time  Skin:  Skin Assessment: Skin Integrity Issues: Skin Integrity Issues:: Wound VAC Wound Vac: x2 to the left leg  Last BM:  7/21 - type 5  Height:  Ht Readings from Last 1 Encounters:  01/24/21 5\' 6"  (1.676 m)    Weight:  Wt Readings from Last 1 Encounters:  01/24/21 65.8 kg    Ideal Body Weight:  59.1 kg  BMI:  Body mass index is 23.41 kg/m.  Estimated Nutritional Needs:  Kcal:  1900-2100 kcal/d Protein:  100-120 g/d Fluid:  >2L/d  01/26/21,  RD, LDN Clinical Dietitian Pager on Amion

## 2021-01-30 NOTE — TOC Progression Note (Signed)
Transition of Care [facility]) - Progression Note    Patient Details  Name: NADINA FOMBY MRN: [id number] Date of Birth: [birth year]  [facility] CM/SW Contact  Barrie Dunker, RN Phone Number: 01/30/2021, 10:05 AM  Clinical Narrative:    Per surgeons's notes  has some swelling in the leg, No contraindication for physical therapy otherwise.  Patient continue with normal white blood cell count.  Patient responding well to debridement, negative pressure dressing and antibiotic therapy.  The next negative pressure dressing change will be on Friday, TOC CM will continue to monitor for needs, ABX changed to PO        Expected Discharge Plan and Services                                                 Social Determinants of Health (SDOH) Interventions    Readmission Risk Interventions No flowsheet data found.

## 2021-01-31 ENCOUNTER — Inpatient Hospital Stay: Payer: Medicare Other | Admitting: Anesthesiology

## 2021-01-31 ENCOUNTER — Encounter
Admission: EM | Disposition: A | Discharge status: Home / Self Care | Payer: Self-pay | Source: Home / Self Care | Attending: Internal Medicine

## 2021-01-31 ENCOUNTER — Encounter: Payer: Self-pay | Admitting: General Surgery

## 2021-01-31 DIAGNOSIS — M726 Necrotizing fasciitis: Secondary | ICD-10-CM | POA: Diagnosis not present

## 2021-01-31 HISTORY — PX: APPLICATION OF WOUND VAC: SHX5189

## 2021-01-31 LAB — CBC
HCT: 24.5 % — ABNORMAL LOW (ref 36.0–46.0)
Hemoglobin: 7.9 g/dL — ABNORMAL LOW (ref 12.0–15.0)
MCH: 30.5 pg (ref 26.0–34.0)
MCHC: 32.2 g/dL (ref 30.0–36.0)
MCV: 94.6 fL (ref 80.0–100.0)
Platelets: 485 10*3/uL — ABNORMAL HIGH (ref 150–400)
RBC: 2.59 MIL/uL — ABNORMAL LOW (ref 3.87–5.11)
RDW: 15.5 % (ref 11.5–15.5)
WBC: 6.3 10*3/uL (ref 4.0–10.5)
nRBC: 0 % (ref 0.0–0.2)

## 2021-01-31 LAB — BASIC METABOLIC PANEL
Anion gap: 7 (ref 5–15)
BUN: 14 mg/dL (ref 8–23)
CO2: 29 mmol/L (ref 22–32)
Calcium: 8.6 mg/dL — ABNORMAL LOW (ref 8.9–10.3)
Chloride: 103 mmol/L (ref 98–111)
Creatinine, Ser: 0.44 mg/dL (ref 0.44–1.00)
GFR, Estimated: >60 mL/min (ref >60)
Glucose, Bld: 93 mg/dL (ref 70–99)
Potassium: 4.1 mmol/L (ref 3.5–5.1)
Sodium: 139 mmol/L (ref 135–145)

## 2021-01-31 LAB — PHOSPHORUS: Phosphorus: 5 mg/dL — ABNORMAL HIGH (ref 2.5–4.6)

## 2021-01-31 LAB — MAGNESIUM: Magnesium: 1.9 mg/dL (ref 1.7–2.4)

## 2021-01-31 SURGERY — APPLICATION, WOUND VAC
Anesthesia: General | Site: Leg Lower | Laterality: Left

## 2021-01-31 MED ORDER — ONDANSETRON HCL 4 MG/2ML IJ SOLN
INTRAMUSCULAR | Status: AC
  Filled 2021-01-31: qty 2

## 2021-01-31 MED ORDER — PROPOFOL 10 MG/ML IV BOLUS
INTRAVENOUS | Status: AC
  Filled 2021-01-31: qty 20

## 2021-01-31 MED ORDER — OXYCODONE HCL 5 MG PO TABS
ORAL_TABLET | ORAL | Status: AC
  Administered 2021-01-31: 5 mg via ORAL
  Filled 2021-01-31: qty 1

## 2021-01-31 MED ORDER — PHENYLEPHRINE HCL (PRESSORS) 10 MG/ML IV SOLN
INTRAVENOUS | Status: DC | PRN
  Administered 2021-01-31 (×3): 100 ug via INTRAVENOUS

## 2021-01-31 MED ORDER — PROPOFOL 10 MG/ML IV BOLUS
INTRAVENOUS | Status: DC | PRN
Start: 2021-01-31 — End: 2021-01-31
  Administered 2021-01-31: 130 mg via INTRAVENOUS

## 2021-01-31 MED ORDER — FENTANYL CITRATE (PF) 100 MCG/2ML IJ SOLN
INTRAMUSCULAR | Status: AC
  Administered 2021-01-31: 25 ug via INTRAVENOUS
  Filled 2021-01-31: qty 2

## 2021-01-31 MED ORDER — ONDANSETRON HCL 4 MG/2ML IJ SOLN
INTRAMUSCULAR | Status: DC | PRN
  Administered 2021-01-31: 4 mg via INTRAVENOUS

## 2021-01-31 MED ORDER — MIDAZOLAM HCL 2 MG/2ML IJ SOLN
INTRAMUSCULAR | Status: DC | PRN
  Administered 2021-01-31: 2 mg via INTRAVENOUS

## 2021-01-31 MED ORDER — ACETAMINOPHEN 10 MG/ML IV SOLN
INTRAVENOUS | Status: AC
  Filled 2021-01-31: qty 100

## 2021-01-31 MED ORDER — EPHEDRINE SULFATE 50 MG/ML IJ SOLN
INTRAMUSCULAR | Status: DC | PRN
  Administered 2021-01-31 (×2): 10 mg via INTRAVENOUS
  Administered 2021-01-31: 5 mg via INTRAVENOUS

## 2021-01-31 MED ORDER — LIDOCAINE HCL (PF) 2 % IJ SOLN
INTRAMUSCULAR | Status: AC
  Filled 2021-01-31: qty 5

## 2021-01-31 MED ORDER — LIDOCAINE HCL (CARDIAC) PF 100 MG/5ML IV SOSY
PREFILLED_SYRINGE | INTRAVENOUS | Status: DC | PRN
  Administered 2021-01-31: 80 mg via INTRAVENOUS

## 2021-01-31 MED ORDER — FENTANYL CITRATE (PF) 100 MCG/2ML IJ SOLN
25.0000 ug | INTRAMUSCULAR | Status: DC | PRN
  Administered 2021-01-31: 50 ug via INTRAVENOUS
  Administered 2021-01-31: 25 ug via INTRAVENOUS

## 2021-01-31 MED ORDER — SODIUM CHLORIDE FLUSH 0.9 % IV SOLN
INTRAVENOUS | Status: AC
  Filled 2021-01-31: qty 10

## 2021-01-31 MED ORDER — MEPERIDINE HCL 25 MG/ML IJ SOLN: 6.2500 mg | INTRAMUSCULAR | Status: DC | PRN

## 2021-01-31 MED ORDER — DEXMEDETOMIDINE HCL 200 MCG/2ML IV SOLN
INTRAVENOUS | Status: DC | PRN
  Administered 2021-01-31 (×2): 8 ug via INTRAVENOUS
  Administered 2021-01-31: 4 ug via INTRAVENOUS

## 2021-01-31 MED ORDER — FENTANYL CITRATE (PF) 100 MCG/2ML IJ SOLN
INTRAMUSCULAR | Status: DC | PRN
  Administered 2021-01-31 (×2): 50 ug via INTRAVENOUS

## 2021-01-31 MED ORDER — OXYCODONE HCL 5 MG PO TABS: 5.0000 mg | ORAL_TABLET | Freq: Once | ORAL | Status: AC | PRN

## 2021-01-31 MED ORDER — MIDAZOLAM HCL 2 MG/2ML IJ SOLN
INTRAMUSCULAR | Status: AC
  Filled 2021-01-31: qty 2

## 2021-01-31 MED ORDER — DEXAMETHASONE SODIUM PHOSPHATE 10 MG/ML IJ SOLN
INTRAMUSCULAR | Status: DC | PRN
  Administered 2021-01-31: 5 mg via INTRAVENOUS

## 2021-01-31 MED ORDER — FENTANYL CITRATE (PF) 100 MCG/2ML IJ SOLN
INTRAMUSCULAR | Status: AC
  Filled 2021-01-31: qty 2

## 2021-01-31 MED ORDER — DEXMEDETOMIDINE (PRECEDEX) IN NS 20 MCG/5ML (4 MCG/ML) IV SYRINGE
PREFILLED_SYRINGE | INTRAVENOUS | Status: AC
  Filled 2021-01-31: qty 5

## 2021-01-31 MED ORDER — OXYCODONE HCL 5 MG/5ML PO SOLN: 5.0000 mg | Freq: Once | ORAL | Status: AC | PRN

## 2021-01-31 MED ORDER — PROMETHAZINE HCL 25 MG/ML IJ SOLN: 6.2500 mg | INTRAMUSCULAR | Status: DC | PRN

## 2021-01-31 MED ORDER — EPHEDRINE 5 MG/ML INJ
INTRAVENOUS | Status: AC
  Filled 2021-01-31: qty 10

## 2021-01-31 SURGICAL SUPPLY — 27 items
BNDG GAUZE ELAST 4 BULKY (GAUZE/BANDAGES/DRESSINGS) ×1 IMPLANT
CANISTER SUCT 1200ML W/VALVE (MISCELLANEOUS) ×1 IMPLANT
CANISTER WOUND CARE 500ML ATS (WOUND CARE) ×2 IMPLANT
DRAPE LAPAROTOMY 100X77 ABD (DRAPES) ×2 IMPLANT
DRSG MEPITEL 4X7.2 (GAUZE/BANDAGES/DRESSINGS) ×2 IMPLANT
DRSG TEGADERM 6X8 (GAUZE/BANDAGES/DRESSINGS) ×1 IMPLANT
DRSG VAC ATS LRG SENSATRAC (GAUZE/BANDAGES/DRESSINGS) ×1 IMPLANT
DRSG VAC ATS MED SENSATRAC (GAUZE/BANDAGES/DRESSINGS) ×2 IMPLANT
ELECT REM PT RETURN 9FT ADLT (ELECTROSURGICAL) ×2 IMPLANT
ELECTRODE REM PT RTRN 9FT ADLT (ELECTROSURGICAL) ×1 IMPLANT
GAUZE 4X4 16PLY ~~LOC~~+RFID DBL (SPONGE) ×1 IMPLANT
GLOVE SURG ENC MOIS LTX SZ6.5 (GLOVE) ×2 IMPLANT
GLOVE SURG UNDER POLY LF SZ6.5 (GLOVE) ×2 IMPLANT
GOWN STRL REUS W/ TWL LRG LVL3 (GOWN DISPOSABLE) ×2 IMPLANT
GOWN STRL REUS W/TWL LRG LVL3 (GOWN DISPOSABLE) ×4
KIT TURNOVER KIT A (KITS) ×2 IMPLANT
MANIFOLD NEPTUNE II (INSTRUMENTS) ×2 IMPLANT
NS IRRIG 1000ML POUR BTL (IV SOLUTION) ×1 IMPLANT
PACK BASIN MINOR ARMC (MISCELLANEOUS) ×2 IMPLANT
PAD NEG PRESSURE SENSATRAC (MISCELLANEOUS) ×1 IMPLANT
SOL PREP PVP 2OZ (MISCELLANEOUS) ×2 IMPLANT
SOLUTION PREP PVP 2OZ (MISCELLANEOUS) ×1 IMPLANT
SPONGE T-LAP 18X18 ~~LOC~~+RFID (SPONGE) ×2 IMPLANT
SUT PROLENE 2 0 FS (SUTURE) ×2 IMPLANT
SUT VIC AB 2-0 SH 27 (SUTURE) ×2
SUT VIC AB 2-0 SH 27XBRD (SUTURE) IMPLANT
VAC GRANUFOAM DRESSING LARGE ×1 IMPLANT

## 2021-01-31 NOTE — Anesthesia Procedure Notes (Signed)
Procedure Name: LMA Insertion Date/Time: 01/31/2021 10:11 AM Performed by: Hezzie Bump, CRNA Pre-anesthesia Checklist: Patient identified, Patient being monitored, Timeout performed, Emergency Drugs available and Suction available Patient Re-evaluated:Patient Re-evaluated prior to induction Oxygen Delivery Method: Circle system utilized Preoxygenation: Pre-oxygenation with 100% oxygen Induction Type: IV induction Ventilation: Mask ventilation without difficulty LMA: LMA inserted LMA Size: 3.5 Tube type: Oral Number of attempts: 1 Placement Confirmation: positive ETCO2 and breath sounds checked- equal and bilateral Tube secured with: Tape Dental Injury: Teeth and Oropharynx as per pre-operative assessment

## 2021-01-31 NOTE — Anesthesia Postprocedure Evaluation (Signed)
Anesthesia Post Note  Patient: Kaylee [name]  Procedure(s) Performed: APPLICATION OF WOUND VAC-WOUND VAC EXCHANGE, DELAYED CLOSURE (Left: Leg Lower)  Patient location during evaluation: PACU Anesthesia Type: General Level of consciousness: awake and alert and oriented Pain management: pain level controlled Vital Signs Assessment: post-procedure vital signs reviewed and stable Respiratory status: spontaneous breathing, nonlabored ventilation and respiratory function stable Cardiovascular status: blood pressure returned to baseline and stable Postop Assessment: no signs of nausea or vomiting Anesthetic complications: no   No notable events documented.   Last Vitals:  Vitals:   01/31/21 1244 01/31/21 1300  BP: 110/60 112/66  Pulse: 92 88  Resp: 17 16  Temp: (!) 36.1 C 36.7 C  SpO2: 100% 100%    Last Pain:  Vitals:   01/31/21 1300  TempSrc: Oral  PainSc:                  Juaquin Ludington

## 2021-01-31 NOTE — Progress Notes (Signed)
PT Cancellation Note  Patient Details Name: GRETTA SAMONS MRN: [id number] DOB: [birth year]   Cancelled Treatment:    Reason Eval/Treat Not Completed: Patient at procedure or test/unavailable.  Pt currently off floor at procedure.  Will re-attempt PT session at a later date/time as medically appropriate.  Hendricks Limes, PT 01/31/21, 11:43 AM

## 2021-01-31 NOTE — Progress Notes (Addendum)
   Date of Admission:  01/15/2021   T    Subjective: Doing much better Was in OR for vac change Swelling, pain improved  Medications:   [MAR Hold] ARIPiprazole  5 mg Oral Daily   [MAR Hold] vitamin C  500 mg Oral BID   [MAR Hold] Chlorhexidine Gluconate Cloth  6 each Topical Q0600   [MAR Hold] diazepam  5 mg Oral Q6H   [MAR Hold] enoxaparin (LOVENOX) injection  40 mg Subcutaneous Q24H   [MAR Hold] iron polysaccharides  150 mg Oral Daily   [MAR Hold] linaclotide  290 mcg Oral QAC breakfast   [MAR Hold] linezolid  600 mg Oral Q12H   [MAR Hold] nutrition supplement (JUVEN)  1 packet Oral BID BM   [MAR Hold] pantoprazole  20 mg Oral Daily   [MAR Hold] polyethylene glycol  17 g Oral Daily   [MAR Hold] Ensure Max Protein  11 oz Oral Daily   [MAR Hold] senna-docusate  2 tablet Oral Daily   [MAR Hold] trimethoprim-polymyxin b  1 drop Left Eye Q6H    Objective: Vital signs in last 24 hours: Temp:  [97.9 F (36.6 C)-99 F (37.2 C)] 98.3 F (36.8 C) (07/22 1215) Pulse Rate:  [77-91] 86 (07/22 1200) Resp:  [12-42] 42 (07/22 1145) BP: (93-117)/(54-67) 106/60 (07/22 1200) SpO2:  [96 %-100 %] 97 % (07/22 1200)  PHYSICAL EXAM:  General: Alert, cooperative, no distress, appears stated age.  Head: Normocephalic, without obvious abnormality, atraumatic. Eyes: Conjunctivae clear, anicteric sclerae. Pupils are equal ENT Nares normal. No drainage or sinus tenderness. Lips, mucosa, and tongue normal. No Thrush Neck: Supple, symmetrical, no adenopathy, thyroid: non tender no carotid bruit and no JVD. Back: No CVA tenderness. Lungs: Clear to auscultation bilaterally. No Wheezing or Rhonchi. No rales. Heart: Regular rate and rhythm, no murmur, rub or gallop. Abdomen: Soft, non-tender,not distended. Bowel sounds normal. No masses Extremities:  surgical wounds covered with wound vac Reviewed OR pictures- wounds look very healthy with granulation, smaller     Skin: No rashes or lesions. Or  bruising Lymph: Cervical, supraclavicular normal. Neurologic: Grossly non-focal  Lab Results Recent Labs    01/30/21 0332 01/31/21 0719  WBC 8.7 6.3  HGB 7.4* 7.9*  HCT 23.8* 24.5*  NA 137 139  K 4.0 4.1  CL 103 103  CO2 27 29  BUN 16 14  CREATININE 0.51 0.44   Liver Panel No results for input(s): PROT, ALBUMIN, AST, ALT, ALKPHOS, BILITOT, BILIDIR, IBILI in the last 72 hours. Sedimentation Rate No results for input(s): ESRSEDRATE in the last 72 hours. C-Reactive Protein No results for input(s): CRP in the last 72 hours.  Microbiology: MRSA/  Strep pyogenes    Assessment/Plan:  Necrotizing fasciitis due to group A streptococcus and MRSA.  On day 12 of linezolid and day 16 of antibiotics..  Linezolid this weekend.  Bipolar disorder on Abilify.  Celexa on hold.  Also on Valium.  Once linezolid is stopped on 02/02/2021 her Valium dose can be reduced and Abilify can be restarted.  Polysubstance use  Parasitophobia  Discussed the management with patient and surgeon  ID will follow her peripherally this weekend call if needed.

## 2021-01-31 NOTE — Consult Note (Addendum)
WOC consult requested for Vac dressing changes to leg beginning on Monday.  Pt is currently in the peri-operative setting.  WOC team will plan to change to 2 locations on the patient's leg as requested Mon with the surgical PA present to assess the wound appearance during the first post-op dressing change as requested. Cammie Mcgee MSN, RN, CWOCN, [place], CNS [phone]

## 2021-01-31 NOTE — Op Note (Addendum)
ATTENDING Surgeon(s): Carolan Shiver, MD   ANESTHESIA: General   PRE-OPERATIVE DIAGNOSIS: Left lower extremity necrotizing fasciitis   POST-OPERATIVE DIAGNOSIS: Left lower extremity necrotizing fasciitis   PROCEDURE(S): 1.)  Negative pressure dressing DME placement 2.)  Delayed closure of mid portion of the medial aspect of leg wound.   INTRAOPERATIVE FINDINGS:  Preoperatively there was a 70 cm long x 30 cm wide open wound Postoperatively there was a 70 cm long x 30 cm wide (2100 sq cm)  Post operative there was a 11 cm x 4 cm wound on the medial aspect Post operative there was a 21 cm x 7 cm wound on the lateral aspect  Today, No new area of debridement. No gross necrotic tissue.    Pre op lateral side:   Pre op Medial side:   Post op medial side: (Covered tendon)        ESTIMATED BLOOD LOSS: 0 mL    SPECIMENS: None   COMPLICATIONS: None apparent   CONDITION AT END OF PROCEDURE: Hemodynamically stable and awake   INDICATIONS FOR PROCEDURE:  Patient is 60 year old female with necrotizing fasciitis.  Patient taken to the operating room for trial of further approximation of wound and exchange of negative pressure dressing.   DETAILS OF PROCEDURE: After informed consent,patient was taken to the OR. Time out performed. General anesthesia induced. Patient placed on supine position. Negative pressure dressing removed. The left lower extremity was cleaned and draped in sterile fashion.  The open wounds were thoroughly irrigated with saline.  No gross purulence or ischemic tissue identified. Hemostasis was achieved.  The midportion of the medial aspect of the wound was able to be approximated decreasing size of the medial wound from 21cm to 11 cm.  Tendon was able to be covered by skin.  Lateral aspect was unable to be approximated any further.  I personally placed the complex negative pressure dressing.  Patient basically has 2 separate negative pressure dressing  systems.

## 2021-01-31 NOTE — Progress Notes (Signed)
PROGRESS NOTE   HPI was taken from Dr. Para March: Kaylee [name] is a 60 y.o. female with medical history significant for COPD, bipolar 1, GERD, d who presents to the ED with a 1 week history of pain swelling and redness in the left lower extremity.  She denies recent travel.  Denies chest pain or shortness of breath.  Says when the pain started she had a fever and felt disoriented.  Denies any recent injury to the area or any insect bites ED course: On arrival BP 124/73, pulse 96, temp 98.7 with O2 sat 99% on room air.  Blood work significant for leukocytosis of 26,000 with lactic acid 1.8/2.7.  Sodium 131, potassium 3.0.  Urinalysis unremarkable EKG, personally viewed and interpreted sinus at 91 with no acute ST-T wave changes Imaging: Chest x-ray with no acute disease.  Left lower extremity ultrasound negative for DVT   Patient started on IV Rocephin and vancomycin and IV fluid bolus.  Hospitalist consulted for admission.   Review of Systems: As per HPI otherwise all other systems on review of systems negative.   As per Dr. Maryfrances Bunnell: Kaylee [name] is a 60 y.o. F with COPD/smoking, recurrent MRSA infection, chart history of seronegative RA not on meds, and illicit drug use who presented with redness, swelling, and pain in the left lower extremity with diffuse erythema, large necrotic blisters and 10 skin, WBC 30 K, lactic acidosis.  Orthopedics were consulted for necrotizing fasciitis and she was taken to the OR.   Hospital course from Dr. Mayford Knife 7/13-7/19/22: Pt was found to have necrotizing fasciitis of LLE and has been to the OR multiple times for wound debridements and wound vac placements (7/7-7/8, 7/11, 7/13, 7/15,  7/19).  Intraop cxs growing MRSA & S. Pyogenes.  Pt was on IV linezolid and was switched to po linezolid on 7/18 as per ID.     Kaylee [name]  ZOX:[id number] DOB: [birth year] DOA: 01/15/2021 PCP: [name], MD   Assessment & Plan:   Principal Problem:    Cellulitis of left leg Active Problems:   Chronic pain syndrome   GERD (gastroesophageal reflux disease)   Bipolar 1 disorder (HCC)   COPD (chronic obstructive pulmonary disease) (HCC)   Sepsis (HCC)   History of parasitic infection   Necrotizing fasciitis of LLE: s/p wound debridement on 7/7-7/8, 7/11, 7/13, 7/15, 7/19 as per general surg. . Intraop cxs growing MRSA & S. Pyogenes.Switched to po linezolid as per ID  next negative pressure dressing change done on Friday 7/22   Leukocytosis: resolved  Thrombocytosis: likely secondary to above infection   Substance use disorder: urine drug screen on 01/17/21 was positive for amphetamines, benzos, & opiates. Continue w/ supportive care  Substance-induced mood disorder & bipolar depression: severity unknown. Continue on reduced dose of abilify while on linezolid. Continue to hold home dose of celexa while on linezolid. Given Abilify, ID and Dr. Harl Bowie recommended scheduled diazepam to prevent serotonin syndrome while on linezolid. Continue on diazepam   Hypokalemia: WNL today     COPD: w/o exacerbation. Continue on bronchodilators and encourage incentive spirometry   Hyponatremia: labile. Will continue to monitor    GERD: continue on PPI    Likely subacute blood loss anemia: likely from nec-fasciitis. H&H are labile. No need for a transfusion currently   DVT prophylaxis: lovenox  Code Status: full Family Communication:  Disposition Plan: PT recs SNF   Level of care: Med-Surg  Status is: Inpatient  Remains inpatient appropriate because:Ongoing diagnostic testing  needed not appropriate for outpatient work up, IV treatments appropriate due to intensity of illness or inability to take PO, and Inpatient level of care appropriate due to severity of illness  Dispo: The patient is from: Home              Anticipated d/c is to: Home vs SNF              Patient currently is not medically stable to d/c.   Difficult to place patient  Yes        Consultants:  General surg  ID  Procedures: s/p wound debridement in the OR multiple times  Antimicrobials: linezolid    Subjective: Pt c/o LLE pain after the dressing change but it is controlled with medications.   Denies any other active issues at this time.  Objective: Vitals:   01/31/21 1244 01/31/21 1300 01/31/21 1525 01/31/21 1539  BP: 110/60 112/66 (!) 101/57 107/61  Pulse: 92 88 89 93  Resp: 17 16 16 17   Temp: (!) 97 F (36.1 C) 98.1 F (36.7 C) 98 F (36.7 C) 98.1 F (36.7 C)  TempSrc:  Oral Oral   SpO2: 100% 100% 99% 100%  Weight:      Height:        Intake/Output Summary (Last 24 hours) at 01/31/2021 1604 Last data filed at 01/31/2021 1114 Gross per 24 hour  Intake 300 ml  Output 3530 ml  Net -3230 ml   Filed Weights   01/15/21 1600 01/20/21 0953 01/24/21 0824  Weight: 65.8 kg 65.8 kg 65.8 kg    Examination:  General exam: Appears calm but uncomfortable   Respiratory system: clear breath sounds b/l. No rales, wheezes Cardiovascular system: S1 & S2+. No rubs or clicks     Gastrointestinal system: Abd is soft, NT, ND & hypoactive bowel sounds  Central nervous system: Alert and oriented. Moves all extremities  Psychiatry: judgement and insight appear normal. Flat mood and affect  LE: Left lower extremity necrotizing fasciitis s/p surgical debridement, wound VAC intact   Data Reviewed: I have personally reviewed following labs and imaging studies  CBC: Recent Labs  Lab 01/25/21 0504 01/26/21 0458 01/27/21 0555 01/28/21 0353 01/29/21 0354 01/30/21 0332 01/31/21 0719  WBC 13.0* 13.7* 9.9 8.8 9.5 8.7 6.3  NEUTROABS 8.5* 8.9* 6.4  --   --   --   --   HGB 8.2* 7.9* 8.0* 8.0* 7.8* 7.4* 7.9*  HCT 25.2* 24.0* 24.7* 25.2* 24.1* 23.8* 24.5*  MCV 90.3 93.0 92.9 94.4 92.3 95.6 94.6  PLT 549* 547* 589* 616* 600* 541* 485*   Basic Metabolic Panel: Recent Labs  Lab 01/27/21 0555 01/28/21 0353 01/29/21 0354 01/30/21 0332  01/31/21 0719  NA 131* 136 134* 137 139  K 3.9 4.2 4.0 4.0 4.1  CL 99 101 99 103 103  CO2 27 30 31 27 29   GLUCOSE 149* 114* 127* 91 93  BUN 12 12 11 16 14   CREATININE 0.52 0.51 0.52 0.51 0.44  CALCIUM 8.2* 8.6* 8.3* 8.2* 8.6*  MG  --   --   --  1.8 1.9  PHOS  --   --   --  4.4 5.0*   GFR: Estimated Creatinine Clearance: 69.1 mL/min (by C-G formula based on SCr of 0.44 mg/dL). Liver Function Tests: No results for input(s): AST, ALT, ALKPHOS, BILITOT, PROT, ALBUMIN in the last 168 hours.  No results for input(s): LIPASE, AMYLASE in the last 168 hours. No results for input(s): AMMONIA in the  last 168 hours. Coagulation Profile: No results for input(s): INR, PROTIME in the last 168 hours.  Cardiac Enzymes: No results for input(s): CKTOTAL, CKMB, CKMBINDEX, TROPONINI in the last 168 hours.  BNP (last 3 results) No results for input(s): PROBNP in the last 8760 hours. HbA1C: No results for input(s): HGBA1C in the last 72 hours. CBG: Recent Labs  Lab 01/28/21 0713 01/28/21 0913  GLUCAP 81 99   Lipid Profile: No results for input(s): CHOL, HDL, LDLCALC, TRIG, CHOLHDL, LDLDIRECT in the last 72 hours. Thyroid Function Tests: No results for input(s): TSH, T4TOTAL, FREET4, T3FREE, THYROIDAB in the last 72 hours. Anemia Panel: Recent Labs    01/29/21 0354 01/29/21 1021  VITAMINB12  --  625  FOLATE 6.3  --   TIBC 286  --   IRON 37  --    Sepsis Labs: No results for input(s): PROCALCITON, LATICACIDVEN in the last 168 hours.   No results found for this or any previous visit (from the past 240 hour(s)).        Radiology Studies: No results found.      Scheduled Meds:  ARIPiprazole  5 mg Oral Daily   vitamin C  500 mg Oral BID   Chlorhexidine Gluconate Cloth  6 each Topical Q0600   diazepam  5 mg Oral Q6H   enoxaparin (LOVENOX) injection  40 mg Subcutaneous Q24H   iron polysaccharides  150 mg Oral Daily   linaclotide  290 mcg Oral QAC breakfast   linezolid   600 mg Oral Q12H   nutrition supplement (JUVEN)  1 packet Oral BID BM   pantoprazole  20 mg Oral Daily   polyethylene glycol  17 g Oral Daily   Ensure Max Protein  11 oz Oral Daily   senna-docusate  2 tablet Oral Daily   sodium chloride flush       trimethoprim-polymyxin b  1 drop Left Eye Q6H   Continuous Infusions:  sodium chloride 10 mL/hr at 01/22/21 2212   lactated ringers 10 mL/hr at 01/28/21 0729     LOS: 16 days    Time spent: 25 mins     Gillis Santa, MD [facility] Pager [phone]  If 7PM-7AM, please contact night-coverage  01/31/2021, 4:04 PM
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
## 2021-01-31 NOTE — Transfer of Care (Signed)
Immediate Anesthesia Transfer of Care Note  Patient: Yexalen A Lenahan  Procedure(s) Performed: APPLICATION OF WOUND VAC-WOUND VAC EXCHANGE, DELAYED CLOSURE (Left: Leg Lower)  Patient Location: PACU  Anesthesia Type:General  Level of Consciousness: drowsy  Airway & Oxygen Therapy: Patient Spontanous Breathing and Patient connected to face mask oxygen  Post-op Assessment: Report given to RN and Post -op Vital signs reviewed and stable  Post vital signs: Reviewed and stable  Last Vitals:  Vitals Value Taken Time  BP 107/54 01/31/21 1110  Temp 36.6 C 01/31/21 1111  Pulse 82 01/31/21 1114  Resp 14 01/31/21 1114  SpO2 100 % 01/31/21 1114  Vitals shown include unvalidated device data.  Last Pain:  Vitals:   01/31/21 0933  TempSrc: Oral  PainSc: 8       Patients Stated Pain Goal: 2 (01/25/21 0530)  Complications: No notable events documented.

## 2021-01-31 NOTE — OR Nursing (Signed)
WOUND VAC SPONGE CHANGED IN OR BY DR. CINTRON DIAZ ON MEDIAL AND LATERAL LOWER LEG.

## 2021-02-01 LAB — BASIC METABOLIC PANEL
Anion gap: 8 (ref 5–15)
BUN: 16 mg/dL (ref 8–23)
CO2: 28 mmol/L (ref 22–32)
Calcium: 8.3 mg/dL — ABNORMAL LOW (ref 8.9–10.3)
Chloride: 102 mmol/L (ref 98–111)
Creatinine, Ser: 0.47 mg/dL (ref 0.44–1.00)
GFR, Estimated: >60 mL/min (ref >60)
Glucose, Bld: 103 mg/dL — ABNORMAL HIGH (ref 70–99)
Potassium: 4.1 mmol/L (ref 3.5–5.1)
Sodium: 138 mmol/L (ref 135–145)

## 2021-02-01 LAB — CBC
HCT: 23.7 % — ABNORMAL LOW (ref 36.0–46.0)
Hemoglobin: 7.7 g/dL — ABNORMAL LOW (ref 12.0–15.0)
MCH: 30.7 pg (ref 26.0–34.0)
MCHC: 32.5 g/dL (ref 30.0–36.0)
MCV: 94.4 fL (ref 80.0–100.0)
Platelets: 433 10*3/uL — ABNORMAL HIGH (ref 150–400)
RBC: 2.51 MIL/uL — ABNORMAL LOW (ref 3.87–5.11)
RDW: 15.7 % — ABNORMAL HIGH (ref 11.5–15.5)
WBC: 7.3 10*3/uL (ref 4.0–10.5)
nRBC: 0 % (ref 0.0–0.2)

## 2021-02-01 LAB — MAGNESIUM: Magnesium: 2 mg/dL (ref 1.7–2.4)

## 2021-02-01 LAB — PHOSPHORUS: Phosphorus: 3.9 mg/dL (ref 2.5–4.6)

## 2021-02-01 NOTE — Progress Notes (Addendum)
PROGRESS NOTE   HPI was taken from Dr. Para March: Kaylee [name] is a 60 y.o. female with medical history significant for COPD, bipolar 1, GERD, d who presents to the ED with a 1 week history of pain swelling and redness in the left lower extremity.  She denies recent travel.  Denies chest pain or shortness of breath.  Says when the pain started she had a fever and felt disoriented.  Denies any recent injury to the area or any insect bites ED course: On arrival BP 124/73, pulse 96, temp 98.7 with O2 sat 99% on room air.  Blood work significant for leukocytosis of 26,000 with lactic acid 1.8/2.7.  Sodium 131, potassium 3.0.  Urinalysis unremarkable EKG, personally viewed and interpreted sinus at 91 with no acute ST-T wave changes Imaging: Chest x-ray with no acute disease.  Left lower extremity ultrasound negative for DVT   Patient started on IV Rocephin and vancomycin and IV fluid bolus.  Hospitalist consulted for admission.   Review of Systems: As per HPI otherwise all other systems on review of systems negative.   As per Dr. Maryfrances Bunnell: Kaylee [name] is a 60 y.o. F with COPD/smoking, recurrent MRSA infection, chart history of seronegative RA not on meds, and illicit drug use who presented with redness, swelling, and pain in the left lower extremity with diffuse erythema, large necrotic blisters and 10 skin, WBC 30 K, lactic acidosis.  Orthopedics were consulted for necrotizing fasciitis and she was taken to the OR.   Hospital course from Dr. Mayford Knife 7/13-7/19/22: Pt was found to have necrotizing fasciitis of LLE and has been to the OR multiple times for wound debridements and wound vac placements (7/7-7/8, 7/11, 7/13, 7/15,  7/19).  Intraop cxs growing MRSA & S. Pyogenes.  Pt was on IV linezolid and was switched to po linezolid on 7/18 as per ID.     Kaylee [name]  YNW:[id number] DOB: 28-Apr-1961 DOA: 01/15/2021 PCP: [name], MD   Assessment & Plan:   Principal Problem:    Cellulitis of left leg Active Problems:   Chronic pain syndrome   GERD (gastroesophageal reflux disease)   Bipolar 1 disorder (HCC)   COPD (chronic obstructive pulmonary disease) (HCC)   Sepsis (HCC)   History of parasitic infection   Necrotizing fasciitis of LLE: s/p wound debridement on 7/7-7/8, 7/11, 7/13, 7/15, 7/19 as per general surg. . Intraop cxs growing MRSA & S. Pyogenes.Switched to po linezolid as per ID  S/p negative pressure dressing change done on Friday 7/22   Leukocytosis: resolved  Thrombocytosis: likely secondary to above infection   Substance use disorder: urine drug screen on 01/17/21 was positive for amphetamines, benzos, & opiates. Continue w/ supportive care  Substance-induced mood disorder & bipolar depression: severity unknown. Continue on reduced dose of abilify while on linezolid. Continue to hold home dose of celexa while on linezolid. Given Abilify, ID and Dr. Harl Bowie recommended scheduled diazepam to prevent serotonin syndrome while on linezolid. Continue on diazepam   Hypokalemia: WNL today     COPD: w/o exacerbation. Continue on bronchodilators and encourage incentive spirometry   Hyponatremia: labile. Will continue to monitor    GERD: continue on PPI    Likely subacute blood loss anemia: likely from nec-fasciitis. H&H are labile. No need for a transfusion currently   DVT prophylaxis: lovenox  Code Status: full Family Communication:  Disposition Plan: PT recs SNF   Level of care: Med-Surg  Status is: Inpatient  Remains inpatient appropriate because:Ongoing diagnostic testing  needed not appropriate for outpatient work up, IV treatments appropriate due to intensity of illness or inability to take PO, and Inpatient level of care appropriate due to severity of illness  Dispo: The patient is from: Home              Anticipated d/c is to: Home vs SNF              Patient currently is not medically stable to d/c.   Difficult to place patient  No        Consultants:  General surg  ID  Procedures: s/p wound debridement in the OR multiple times  Antimicrobials: linezolid    Subjective: No overnight issues, patient was complaining of pain in the left lower extremity 7/10, has not taken any pain medications this morning.  Patient denied any chest pain or palpitation, no shortness of breath.  No fever or chills.   Objective: Vitals:   01/31/21 1539 01/31/21 2200 02/01/21 0458 02/01/21 0822  BP: 107/61 (!) 96/56 (!) 108/57 103/63  Pulse: 93 85 82 73  Resp: 17 17 17 18   Temp: 98.1 F (36.7 C) 98.3 F (36.8 C) 98.1 F (36.7 C) 97.8 F (36.6 C)  TempSrc:      SpO2: 100% 98% 98% 100%  Weight:      Height:        Intake/Output Summary (Last 24 hours) at 02/01/2021 1423 Last data filed at 02/01/2021 1057 Gross per 24 hour  Intake 120 ml  Output 4050 ml  Net -3930 ml   Filed Weights   01/15/21 1600 01/20/21 0953 01/24/21 0824  Weight: 65.8 kg 65.8 kg 65.8 kg    Examination:  General exam: Appears calm but uncomfortable   Respiratory system: clear breath sounds b/l. No rales, wheezes Cardiovascular system: S1 & S2+. No rubs or clicks     Gastrointestinal system: Abd is soft, NT, ND & hypoactive bowel sounds  Central nervous system: Alert and oriented. Moves all extremities  Psychiatry: judgement and insight appear normal. Flat mood and affect  LE: Left lower extremity necrotizing fasciitis s/p surgical debridement, wound VAC intact   Data Reviewed: I have personally reviewed following labs and imaging studies  CBC: Recent Labs  Lab 01/26/21 0458 01/27/21 0555 01/28/21 0353 01/29/21 0354 01/30/21 0332 01/31/21 0719 02/01/21 0637  WBC 13.7* 9.9 8.8 9.5 8.7 6.3 7.3  NEUTROABS 8.9* 6.4  --   --   --   --   --   HGB 7.9* 8.0* 8.0* 7.8* 7.4* 7.9* 7.7*  HCT 24.0* 24.7* 25.2* 24.1* 23.8* 24.5* 23.7*  MCV 93.0 92.9 94.4 92.3 95.6 94.6 94.4  PLT 547* 589* 616* 600* 541* 485* 433*   Basic Metabolic  Panel: Recent Labs  Lab 01/28/21 0353 01/29/21 0354 01/30/21 0332 01/31/21 0719 02/01/21 0637  NA 136 134* 137 139 138  K 4.2 4.0 4.0 4.1 4.1  CL 101 99 103 103 102  CO2 30 31 27 29 28   GLUCOSE 114* 127* 91 93 103*  BUN 12 11 16 14 16   CREATININE 0.51 0.52 0.51 0.44 0.47  CALCIUM 8.6* 8.3* 8.2* 8.6* 8.3*  MG  --   --  1.8 1.9 2.0  PHOS  --   --  4.4 5.0* 3.9   GFR: Estimated Creatinine Clearance: 69.1 mL/min (by C-G formula based on SCr of 0.47 mg/dL). Liver Function Tests: No results for input(s): AST, ALT, ALKPHOS, BILITOT, PROT, ALBUMIN in the last 168 hours.  No results for input(s):  LIPASE, AMYLASE in the last 168 hours. No results for input(s): AMMONIA in the last 168 hours. Coagulation Profile: No results for input(s): INR, PROTIME in the last 168 hours.  Cardiac Enzymes: No results for input(s): CKTOTAL, CKMB, CKMBINDEX, TROPONINI in the last 168 hours.  BNP (last 3 results) No results for input(s): PROBNP in the last 8760 hours. HbA1C: No results for input(s): HGBA1C in the last 72 hours. CBG: Recent Labs  Lab 01/28/21 0713 01/28/21 0913  GLUCAP 81 99   Lipid Profile: No results for input(s): CHOL, HDL, LDLCALC, TRIG, CHOLHDL, LDLDIRECT in the last 72 hours. Thyroid Function Tests: No results for input(s): TSH, T4TOTAL, FREET4, T3FREE, THYROIDAB in the last 72 hours. Anemia Panel: No results for input(s): VITAMINB12, FOLATE, FERRITIN, TIBC, IRON, RETICCTPCT in the last 72 hours.  Sepsis Labs: No results for input(s): PROCALCITON, LATICACIDVEN in the last 168 hours.   No results found for this or any previous visit (from the past 240 hour(s)).        Radiology Studies: No results found.      Scheduled Meds:  ARIPiprazole  5 mg Oral Daily   vitamin C  500 mg Oral BID   Chlorhexidine Gluconate Cloth  6 each Topical Q0600   diazepam  5 mg Oral Q6H   enoxaparin (LOVENOX) injection  40 mg Subcutaneous Q24H   iron polysaccharides  150 mg  Oral Daily   linaclotide  290 mcg Oral QAC breakfast   linezolid  600 mg Oral Q12H   nutrition supplement (JUVEN)  1 packet Oral BID BM   pantoprazole  20 mg Oral Daily   polyethylene glycol  17 g Oral Daily   Ensure Max Protein  11 oz Oral Daily   senna-docusate  2 tablet Oral Daily   trimethoprim-polymyxin b  1 drop Left Eye Q6H   Continuous Infusions:  sodium chloride 10 mL/hr at 01/22/21 2212   lactated ringers 10 mL/hr at 01/28/21 0729     LOS: 17 days    Time spent: 25 mins     Gillis Santa, MD [facility] Pager [phone]  If 7PM-7AM, please contact night-coverage  02/01/2021, 2:23 PM

## 2021-02-02 LAB — CBC
HCT: 26.9 % — ABNORMAL LOW (ref 36.0–46.0)
Hemoglobin: 8.4 g/dL — ABNORMAL LOW (ref 12.0–15.0)
MCH: 30.5 pg (ref 26.0–34.0)
MCHC: 31.2 g/dL (ref 30.0–36.0)
MCV: 97.8 fL (ref 80.0–100.0)
Platelets: 389 10*3/uL (ref 150–400)
RBC: 2.75 MIL/uL — ABNORMAL LOW (ref 3.87–5.11)
RDW: 16.1 % — ABNORMAL HIGH (ref 11.5–15.5)
WBC: 6.7 10*3/uL (ref 4.0–10.5)
nRBC: 0 % (ref 0.0–0.2)

## 2021-02-02 LAB — BASIC METABOLIC PANEL
Anion gap: 7 (ref 5–15)
BUN: 19 mg/dL (ref 8–23)
CO2: 27 mmol/L (ref 22–32)
Calcium: 8.7 mg/dL — ABNORMAL LOW (ref 8.9–10.3)
Chloride: 101 mmol/L (ref 98–111)
Creatinine, Ser: 0.74 mg/dL (ref 0.44–1.00)
GFR, Estimated: >60 mL/min (ref >60)
Glucose, Bld: 104 mg/dL — ABNORMAL HIGH (ref 70–99)
Potassium: 3.8 mmol/L (ref 3.5–5.1)
Sodium: 135 mmol/L (ref 135–145)

## 2021-02-02 MED ORDER — OXYCODONE-ACETAMINOPHEN 5-325 MG PO TABS
1.0000 | ORAL_TABLET | Freq: Four times a day (QID) | ORAL | Status: DC | PRN
  Administered 2021-02-02 – 2021-02-03 (×4): 2 via ORAL
  Administered 2021-02-03: 1 via ORAL
  Administered 2021-02-04: 2 via ORAL
  Filled 2021-02-02 (×6): qty 2

## 2021-02-02 MED ORDER — HYDROMORPHONE HCL 1 MG/ML IJ SOLN
1.0000 mg | INTRAMUSCULAR | Status: DC | PRN
  Administered 2021-02-02 – 2021-02-03 (×5): 1 mg via INTRAVENOUS
  Filled 2021-02-02 (×5): qty 1

## 2021-02-02 MED ORDER — VITAMIN D (ERGOCALCIFEROL) 1.25 MG (50000 UNIT) PO CAPS
50000.0000 [IU] | ORAL_CAPSULE | ORAL | Status: DC
  Administered 2021-02-02: 50000 [IU] via ORAL
  Filled 2021-02-02: qty 1
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
## 2021-02-02 NOTE — Plan of Care (Signed)

## 2021-02-02 NOTE — Progress Notes (Signed)
PROGRESS NOTE   HPI was taken from Dr. Para March: Kaylee [name] is a 60 y.o. female with medical history significant for COPD, bipolar 1, GERD, d who presents to the ED with a 1 week history of pain swelling and redness in the left lower extremity.  She denies recent travel.  Denies chest pain or shortness of breath.  Says when the pain started she had a fever and felt disoriented.  Denies any recent injury to the area or any insect bites ED course: On arrival BP 124/73, pulse 96, temp 98.7 with O2 sat 99% on room air.  Blood work significant for leukocytosis of 26,000 with lactic acid 1.8/2.7.  Sodium 131, potassium 3.0.  Urinalysis unremarkable EKG, personally viewed and interpreted sinus at 91 with no acute ST-T wave changes Imaging: Chest x-ray with no acute disease.  Left lower extremity ultrasound negative for DVT   Patient started on IV Rocephin and vancomycin and IV fluid bolus.  Hospitalist consulted for admission.   Review of Systems: As per HPI otherwise all other systems on review of systems negative.   As per Dr. Maryfrances Bunnell: Kaylee [name] is a 60 y.o. F with COPD/smoking, recurrent MRSA infection, chart history of seronegative RA not on meds, and illicit drug use who presented with redness, swelling, and pain in the left lower extremity with diffuse erythema, large necrotic blisters and 10 skin, WBC 30 K, lactic acidosis.  Orthopedics were consulted for necrotizing fasciitis and she was taken to the OR.   Hospital course from Dr. Mayford Knife 7/13-7/19/22: Pt was found to have necrotizing fasciitis of LLE and has been to the OR multiple times for wound debridements and wound vac placements (7/7-7/8, 7/11, 7/13, 7/15,  7/19).  Intraop cxs growing MRSA & S. Pyogenes.  Pt was on IV linezolid and was switched to po linezolid on 7/18 as per ID.     Kaylee [name]  YNW:[id number] DOB: 28-Apr-1961 DOA: 01/15/2021 PCP: [name], MD   Assessment & Plan:   Principal Problem:    Cellulitis of left leg Active Problems:   Chronic pain syndrome   GERD (gastroesophageal reflux disease)   Bipolar 1 disorder (HCC)   COPD (chronic obstructive pulmonary disease) (HCC)   Sepsis (HCC)   History of parasitic infection   Necrotizing fasciitis of LLE: s/p wound debridement on 7/7-7/8, 7/11, 7/13, 7/15, 7/19 as per general surg. . Intraop cxs growing MRSA & S. Pyogenes.Switched to po linezolid as per ID  S/p negative pressure dressing change done on Friday 7/22   Leukocytosis: resolved  Thrombocytosis: likely secondary to above infection   Substance use disorder: urine drug screen on 01/17/21 was positive for amphetamines, benzos, & opiates. Continue w/ supportive care  Substance-induced mood disorder & bipolar depression: severity unknown. Continue on reduced dose of abilify while on linezolid. Continue to hold home dose of celexa while on linezolid. Given Abilify, ID and Dr. Harl Bowie recommended scheduled diazepam to prevent serotonin syndrome while on linezolid. Continue on diazepam   Hypokalemia: WNL today     COPD: w/o exacerbation. Continue on bronchodilators and encourage incentive spirometry   Hyponatremia: labile. Will continue to monitor    GERD: continue on PPI    Likely subacute blood loss anemia: likely from nec-fasciitis. H&H are labile. No need for a transfusion currently   DVT prophylaxis: lovenox  Code Status: full Family Communication:  Disposition Plan: PT recs SNF   Level of care: Med-Surg  Status is: Inpatient  Remains inpatient appropriate because:Ongoing diagnostic testing  needed not appropriate for outpatient work up, IV treatments appropriate due to intensity of illness or inability to take PO, and Inpatient level of care appropriate due to severity of illness  Dispo: The patient is from: Home              Anticipated d/c is to: Home, with HH/PT and arrangement for wound care at home              Patient currently is not stable to  discharge, as she needs wound VAC change in the OR, general surgery will try to change at bedside tomorrow a.m. and then plan for disposition if patient tolerates.     Difficult to place patient No        Consultants:  General surg  ID  Procedures: s/p wound debridement in the OR multiple times  Antimicrobials: linezolid    Subjective: No overnight issues, patient is still has severe pain 7/10 and increased to 9/10 with activity.  Patient said that she is due for her pain medication. Patient denied any chest pain or palpitation, no shortness of breath.  No fever or chills. Patient would like to go home with wound care arrangements if possible.  Objective: Vitals:   02/01/21 1635 02/01/21 2117 02/01/21 2119 02/02/21 0531  BP: (!) 102/56 (!) 96/58 (!) 96/51 101/61  Pulse: 89 79 80   Resp: 16 18  18   Temp: 97.9 F (36.6 C) 98.5 F (36.9 C)  98.5 F (36.9 C)  TempSrc:  Oral  Oral  SpO2: 100% 99%  98%  Weight:      Height:        Intake/Output Summary (Last 24 hours) at 02/02/2021 1431 Last data filed at 02/02/2021 0537 Gross per 24 hour  Intake --  Output 2850 ml  Net -2850 ml   Filed Weights   01/15/21 1600 01/20/21 0953 01/24/21 0824  Weight: 65.8 kg 65.8 kg 65.8 kg    Examination:  General exam: Appears calm but uncomfortable   Respiratory system: clear breath sounds b/l. No rales, wheezes Cardiovascular system: S1 & S2+. No rubs or clicks     Gastrointestinal system: Abd is soft, NT, ND & hypoactive bowel sounds  Central nervous system: Alert and oriented. Moves all extremities  Psychiatry: judgement and insight appear normal. Flat mood and affect  LE: Left lower extremity necrotizing fasciitis s/p surgical debridement, wound VAC intact   Data Reviewed: I have personally reviewed following labs and imaging studies  CBC: Recent Labs  Lab 01/27/21 0555 01/28/21 0353 01/29/21 0354 01/30/21 0332 01/31/21 0719 02/01/21 0637 02/02/21 0608  WBC 9.9   <  > 9.5 8.7 6.3 7.3 6.7  NEUTROABS 6.4  --   --   --   --   --   --   HGB 8.0*   < > 7.8* 7.4* 7.9* 7.7* 8.4*  HCT 24.7*   < > 24.1* 23.8* 24.5* 23.7* 26.9*  MCV 92.9   < > 92.3 95.6 94.6 94.4 97.8  PLT 589*   < > 600* 541* 485* 433* 389   < > = values in this interval not displayed.   Basic Metabolic Panel: Recent Labs  Lab 01/29/21 0354 01/30/21 0332 01/31/21 0719 02/01/21 0637 02/02/21 0608  NA 134* 137 139 138 135  K 4.0 4.0 4.1 4.1 3.8  CL 99 103 103 102 101  CO2 31 27 29 28 27   GLUCOSE 127* 91 93 103* 104*  BUN 11 16 14 16  19  CREATININE 0.52 0.51 0.44 0.47 0.74  CALCIUM 8.3* 8.2* 8.6* 8.3* 8.7*  MG  --  1.8 1.9 2.0  --   PHOS  --  4.4 5.0* 3.9  --    GFR: Estimated Creatinine Clearance: 69.1 mL/min (by C-G formula based on SCr of 0.74 mg/dL). Liver Function Tests: No results for input(s): AST, ALT, ALKPHOS, BILITOT, PROT, ALBUMIN in the last 168 hours.  No results for input(s): LIPASE, AMYLASE in the last 168 hours. No results for input(s): AMMONIA in the last 168 hours. Coagulation Profile: No results for input(s): INR, PROTIME in the last 168 hours.  Cardiac Enzymes: No results for input(s): CKTOTAL, CKMB, CKMBINDEX, TROPONINI in the last 168 hours.  BNP (last 3 results) No results for input(s): PROBNP in the last 8760 hours. HbA1C: No results for input(s): HGBA1C in the last 72 hours. CBG: Recent Labs  Lab 01/28/21 0713 01/28/21 0913  GLUCAP 81 99   Lipid Profile: No results for input(s): CHOL, HDL, LDLCALC, TRIG, CHOLHDL, LDLDIRECT in the last 72 hours. Thyroid Function Tests: No results for input(s): TSH, T4TOTAL, FREET4, T3FREE, THYROIDAB in the last 72 hours. Anemia Panel: No results for input(s): VITAMINB12, FOLATE, FERRITIN, TIBC, IRON, RETICCTPCT in the last 72 hours.  Sepsis Labs: No results for input(s): PROCALCITON, LATICACIDVEN in the last 168 hours.   No results found for this or any previous visit (from the past 240 hour(s)).         Radiology Studies: No results found.      Scheduled Meds:  ARIPiprazole  5 mg Oral Daily   vitamin C  500 mg Oral BID   Chlorhexidine Gluconate Cloth  6 each Topical Q0600   diazepam  5 mg Oral Q6H   enoxaparin (LOVENOX) injection  40 mg Subcutaneous Q24H   iron polysaccharides  150 mg Oral Daily   linaclotide  290 mcg Oral QAC breakfast   linezolid  600 mg Oral Q12H   nutrition supplement (JUVEN)  1 packet Oral BID BM   pantoprazole  20 mg Oral Daily   polyethylene glycol  17 g Oral Daily   Ensure Max Protein  11 oz Oral Daily   senna-docusate  2 tablet Oral Daily   trimethoprim-polymyxin b  1 drop Left Eye Q6H   Vitamin D (Ergocalciferol)  50,000 Units Oral Q7 days   Continuous Infusions:  sodium chloride 10 mL/hr at 01/22/21 2212   lactated ringers 10 mL/hr at 01/28/21 0729     LOS: 18 days    Time spent: 25 mins     Gillis Santa, MD [facility] Pager [phone]  If 7PM-7AM, please contact night-coverage  02/02/2021, 2:31 PM

## 2021-02-02 NOTE — Progress Notes (Addendum)
Subjective:  CC: Kaylee [name] is a 60 y.o. female  Hospital stay day 18, 2 Days Post-Op wound VAC exchange for necrotizing fasciitis of left leg  HPI: No acute issues overnight.  ROS:  General: Denies weight loss, weight gain, fatigue, fevers, chills, and night sweats. Heart: Denies chest pain, palpitations, racing heart, irregular heartbeat, leg pain or swelling, and decreased activity tolerance. Respiratory: Denies breathing difficulty, shortness of breath, wheezing, cough, and sputum. GI: Denies change in appetite, heartburn, nausea, vomiting, constipation, diarrhea, and blood in stool. GU: Denies difficulty urinating, pain with urinating, urgency, frequency, blood in urine.   Objective:   Temp:  [97.9 F (36.6 C)-98.5 F (36.9 C)] 98.5 F (36.9 C) (07/24 0531) Pulse Rate:  [79-89] 80 (07/23 2119) Resp:  [16-18] 18 (07/24 0531) BP: (96-102)/(51-61) 101/61 (07/24 0531) SpO2:  [98 %-100 %] 98 % (07/24 0531)     Height: 5\' 6"  (167.6 cm) Weight: 65.8 kg BMI (Calculated): 23.42   Intake/Output this shift:   Intake/Output Summary (Last 24 hours) at 02/02/2021 1239 Last data filed at 02/02/2021 02/04/2021 Gross per 24 hour  Intake --  Output 2850 ml  Net -2850 ml    Constitutional :  alert, cooperative, appears stated age, and no distress  Respiratory:  clear to auscultation bilaterally  Cardiovascular:  regular rate and rhythm  Gastrointestinal: soft, non-tender; bowel sounds normal; no masses,  no organomegaly.   Skin: Cool and moist.  Left lower leg wound VAC intact with thin serosanguineous output.  Swelling extending to the feet noted.,  Unchanged per patient report  Psychiatric: Normal affect, non-agitated, not confused       LABS:  CMP Latest Ref Rng & Units 02/02/2021 02/01/2021 01/31/2021  Glucose 70 - 99 mg/dL 02/02/2021) 188(C) 93  BUN 8 - 23 mg/dL 19 16 14   Creatinine 0.44 - 1.00 mg/dL 166(A 6.30  Sodium 135 - 145 mmol/L 135 138 139  Potassium 3.5 - 5.1 mmol/L 3.8  4.1 4.1  Chloride 98 - 111 mmol/L 101 102 103  CO2 22 - 32 mmol/L 27 28 29   Calcium 8.9 - 10.3 mg/dL 1.60) 8.3(L) 8.6(L)  Total Protein 6.5 - 8.1 g/dL - - -  Total Bilirubin 0.3 - 1.2 mg/dL - - -  Alkaline Phos 38 - 126 U/L - - -  AST 15 - 41 U/L - - -  ALT 0 - 44 U/L - - -   CBC Latest Ref Rng & Units 02/02/2021 02/01/2021 01/31/2021  WBC 4.0 - 10.5 K/uL 6.7 7.3 6.3  Hemoglobin 12.0 - 15.0 g/dL 02/04/2021) 7.7(L) 7.9(L)  Hematocrit 36.0 - 46.0 % 26.9(L) 23.7(L) 24.5(L)  Platelets 150 - 400 K/uL 389 433(H) 485(H)    RADS: N/a Assessment:   Status post debridement wound VAC exchanges in the operating room for left leg necrotizing fasciitis.  We will proceed with wound VAC change attempt at bedside tomorrow around noon with wound care nurse as originally planned.  Once patient is tolerating bedside exchange discharge planning can be initiated.  Patient stated that she would prefer to have home health nurse visits if possible.  Patient does have a history of chronic pain syndrome and per patient report primary had concerns of possible dependence.  We will continue to monitor closely and make sure she has appropriate follow-up with primary once discharged for continued maintenance if needed.

## 2021-02-03 ENCOUNTER — Encounter: Payer: Self-pay | Admitting: General Surgery

## 2021-02-03 DIAGNOSIS — M726 Necrotizing fasciitis: Secondary | ICD-10-CM | POA: Diagnosis not present

## 2021-02-03 LAB — BASIC METABOLIC PANEL
Anion gap: 4 — ABNORMAL LOW (ref 5–15)
BUN: 20 mg/dL (ref 8–23)
CO2: 30 mmol/L (ref 22–32)
Calcium: 8.8 mg/dL — ABNORMAL LOW (ref 8.9–10.3)
Chloride: 103 mmol/L (ref 98–111)
Creatinine, Ser: 0.73 mg/dL (ref 0.44–1.00)
GFR, Estimated: >60 mL/min (ref >60)
Glucose, Bld: 96 mg/dL (ref 70–99)
Potassium: 4.2 mmol/L (ref 3.5–5.1)
Sodium: 137 mmol/L (ref 135–145)

## 2021-02-03 LAB — CBC
HCT: 26.4 % — ABNORMAL LOW (ref 36.0–46.0)
Hemoglobin: 8.5 g/dL — ABNORMAL LOW (ref 12.0–15.0)
MCH: 30.7 pg (ref 26.0–34.0)
MCHC: 32.2 g/dL (ref 30.0–36.0)
MCV: 95.3 fL (ref 80.0–100.0)
Platelets: 391 10*3/uL (ref 150–400)
RBC: 2.77 MIL/uL — ABNORMAL LOW (ref 3.87–5.11)
RDW: 16.1 % — ABNORMAL HIGH (ref 11.5–15.5)
WBC: 6.4 10*3/uL (ref 4.0–10.5)
nRBC: 0 % (ref 0.0–0.2)

## 2021-02-03 MED ORDER — SENNA 8.6 MG PO TABS: 1.0000 | ORAL_TABLET | Freq: Two times a day (BID) | ORAL | 0 refills | Status: DC

## 2021-02-03 MED ORDER — OXYCODONE-ACETAMINOPHEN 7.5-325 MG PO TABS: 1.0000 | ORAL_TABLET | Freq: Three times a day (TID) | ORAL | 0 refills | Status: DC | PRN

## 2021-02-03 MED ORDER — 0.9 % SODIUM CHLORIDE (POUR BTL) OPTIME
TOPICAL | Status: DC | PRN
  Administered 2021-01-31: 800 mL
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
## 2021-02-03 NOTE — Progress Notes (Signed)
   Date of Admission:  01/15/2021     ID: Kaylee [name] is a 60 y.o. female  Principal Problem:   Cellulitis of left leg Active Problems:   Chronic pain syndrome   GERD (gastroesophageal reflux disease)   Bipolar 1 disorder (HCC)   COPD (chronic obstructive pulmonary disease) (HCC)   Sepsis (HCC)   History of parasitic infection    Subjective: Had Vac change at bedside today and did well Says the pain is better She completed antibiotics last night No fever No nausea No diarrhea No rash  Medications:   ARIPiprazole  5 mg Oral Daily   vitamin C  500 mg Oral BID   Chlorhexidine Gluconate Cloth  6 each Topical Q0600   diazepam  5 mg Oral Q6H   enoxaparin (LOVENOX) injection  40 mg Subcutaneous Q24H   iron polysaccharides  150 mg Oral Daily   linaclotide  290 mcg Oral QAC breakfast   nutrition supplement (JUVEN)  1 packet Oral BID BM   pantoprazole  20 mg Oral Daily   polyethylene glycol  17 g Oral Daily   Ensure Max Protein  11 oz Oral Daily   senna-docusate  2 tablet Oral Daily   trimethoprim-polymyxin b  1 drop Left Eye Q6H   Vitamin D (Ergocalciferol)  50,000 Units Oral Q7 days    Objective: Vital signs in last 24 hours: Temp:  [97.9 F (36.6 C)-98.5 F (36.9 C)] 98 F (36.7 C) (07/25 0836) Pulse Rate:  [80-92] 82 (07/25 0836) Resp:  [14-18] 18 (07/25 0836) BP: (88-110)/(55-62) 110/59 (07/25 0836) SpO2:  [98 %-100 %] 99 % (07/25 0836)  PHYSICAL EXAM:  General: Alert, cooperative, no distress, appears stated age.  Head: Normocephalic, without obvious abnormality, atraumatic. Eyes: Conjunctivae clear, anicteric sclerae. Pupils are equal ENT Nares normal. No drainage or sinus tenderness. Lips, mucosa, and tongue normal. No Thrush Neck: Supple, symmetrical, no adenopathy, thyroid: non tender no carotid bruit and no JVD. Back: No CVA tenderness. Lungs: Clear to auscultation bilaterally. No Wheezing or Rhonchi. No rales. Heart: Regular rate and rhythm, no  murmur, rub or gallop. Abdomen: Soft, non-tender,not distended. Bowel sounds normal. No masses Extremities: left leg wound covered with vac Skin: No rashes or lesions. Or bruising Lymph: Cervical, supraclavicular normal. Neurologic: Grossly non-focal  Lab Results Recent Labs    02/02/21 0608 02/03/21 0416  WBC 6.7 6.4  HGB 8.4* 8.5*  HCT 26.9* 26.4*  NA 135 137  K 3.8 4.2  CL 101 103  CO2 27 30  BUN 19 20  CREATININE 0.74 0.73    Assessment/Plan: Necrotizing fascitis secondary to MRSA and streptococcus pyogenes- s/p extensive debridement- completed 18 days of antibiotics ( linezolid for 14 days) Doing well- surgical wound healing well  ID will sign off- call if needed

## 2021-02-03 NOTE — Care Management Important Message (Signed)
Important Message  Patient Details  Name: Kaylee [name] MRN: [id number] Date of Birth: Dec 06, 1960   Medicare Important Message Given:  Yes  Reviewed Medicare IM with patient via room phone due to isolation status.  Copy of Medicare IM mailed to patient's attention at daughter's address provided:  [address] [place], [place] [address]    Johnell Comings 02/03/2021, 4:22 PM

## 2021-02-03 NOTE — Progress Notes (Signed)
Physical Therapy Treatment Patient Details Name: Kaylee [name] MRN: [id number] DOB: [birth year] Today's Date: 02/03/2021    History of Present Illness Pt is a 60 y/o F admitted on 01/15/21 with c/c of LLE swelling & redness x 1 week. Pt is being treated for necrotizing fasciitis of LLE with cultures growing MRSA & S. Pyogenes. Pt has had multiple wound debridements & wound vac exchanges.  PMH: COPD, bipolar 1, GERD, anxiety/depression, RA.    PT Comments    Pt resting in bed upon PT arrival; pt reporting that she had been through enough today (had vac change at bedside) and did not want to do any mobility/activities.  Pt reports plan to use crutches on steps into home.  Pt does not appear to have performed any ambulation or stairs since hospital admission.  Therapist attempted to get pt to participate in therapy to trial crutches on steps (and also walking) but pt firmly refused and reported she would not have any issues and has been on crutches a lot so she was not worried.  TOC ordering pt a manual w/c for home use.  Therapist educated pt on how to safely bump up/down stairs in manual w/c with 2 person assist (pt verbalizing appropriate understanding).  Therapist also educated pt on w/c safety/use and fall prevention: pt verbalizing appropriate understanding.  Pt reports plan to discharge home today and reports no questions or concerns for home discharge.    Follow Up Recommendations  SNF;Supervision/Assistance - 24 hour     Equipment Recommendations  Rolling walker with 5" wheels;3in1 (PT);Wheelchair (measurements PT);Wheelchair cushion (measurements PT)    Recommendations for Other Services       Precautions / Restrictions Precautions Precautions: Fall Precaution Comments: 2 wound vacs on LLE due to significantly large wound on medially & lateral sides of distal LLE Restrictions Weight Bearing Restrictions: No LLE Weight Bearing: Weight bearing as tolerated    Mobility  Bed  Mobility               General bed mobility comments: pt declined any mobility    Transfers                    Ambulation/Gait                 Stairs             Wheelchair Mobility    Modified Rankin (Stroke Patients Only)       Balance                                            Cognition Arousal/Alertness: Awake/alert Behavior During Therapy: WFL for tasks assessed/performed Overall Cognitive Status: Within Functional Limits for tasks assessed                                        Exercises      General Comments General comments (skin integrity, edema, etc.): L LE wound vacs in place.      Pertinent Vitals/Pain Pain Assessment: Faces Faces Pain Scale: Hurts little more Pain Location: LLE Pain Descriptors / Indicators: Sore Pain Intervention(s): Limited activity within patient's tolerance;Monitored during session    Home Living  Prior Function            PT Goals (current goals can now be found in the care plan section) Acute Rehab PT Goals Patient Stated Goal: go home PT Goal Formulation: With patient Time For Goal Achievement: 02/08/21 Potential to Achieve Goals: Fair Progress towards PT goals: Progressing toward goals    Frequency    Min 2X/week      PT Plan Current plan remains appropriate    Co-evaluation              AM-PAC PT "6 Clicks" Mobility   Outcome Measure  Help needed turning from your back to your side while in a flat bed without using bedrails?: None Help needed moving from lying on your back to sitting on the side of a flat bed without using bedrails?: None Help needed moving to and from a bed to a chair (including a wheelchair)?: A Little Help needed standing up from a chair using your arms (e.g., wheelchair or bedside chair)?: A Little Help needed to walk in hospital room?: A Lot Help needed climbing 3-5 steps with a  railing? : Total 6 Click Score: 17    End of Session   Activity Tolerance: Patient tolerated treatment well Patient left: in bed;with call bell/phone within reach;with bed alarm set;Other (comment) (L LE elevated on pillow) Nurse Communication: Other (comment) (pt requesting dressing to be addressed) PT Visit Diagnosis: Pain;Other abnormalities of gait and mobility (R26.89);Difficulty in walking, not elsewhere classified (R26.2);Muscle weakness (generalized) (M62.81);Unsteadiness on feet (R26.81) Pain - Right/Left: Left Pain - part of body: Leg     Time: 1452-1500 PT Time Calculation (min) (ACUTE ONLY): 8 min  Charges:  $Therapeutic Activity: 8-22 mins                    Hendricks Limes, PT 02/03/21, 4:07 PM

## 2021-02-03 NOTE — Plan of Care (Signed)

## 2021-02-03 NOTE — Progress Notes (Signed)
Subjective:  CC: Kaylee [name] is a 60 y.o. female  Hospital stay day 19, 3 Days Post-Op wound VAC exchange for necrotizing fasciitis of left leg  HPI: No acute issues overnight.  ROS:  General: Denies weight loss, weight gain, fatigue, fevers, chills, and night sweats. Heart: Denies chest pain, palpitations, racing heart, irregular heartbeat, leg pain or swelling, and decreased activity tolerance. Respiratory: Denies breathing difficulty, shortness of breath, wheezing, cough, and sputum. GI: Denies change in appetite, heartburn, nausea, vomiting, constipation, diarrhea, and blood in stool. GU: Denies difficulty urinating, pain with urinating, urgency, frequency, blood in urine.   Objective:   Temp:  [98 F (36.7 C)-98.5 F (36.9 C)] 98.2 F (36.8 C) (07/25 1721) Pulse Rate:  [80-88] 88 (07/25 1721) Resp:  [14-18] 17 (07/25 1721) BP: (88-116)/(55-66) 116/66 (07/25 1721) SpO2:  [98 %-100 %] 100 % (07/25 1721)     Height: 5\' 6"  (167.6 cm) Weight: 65.8 kg BMI (Calculated): 23.42   Intake/Output this shift:   Intake/Output Summary (Last 24 hours) at 02/03/2021 1837 Last data filed at 02/03/2021 0900 Gross per 24 hour  Intake --  Output 175 ml  Net -175 ml    Constitutional :  alert, cooperative, appears stated age, and no distress  Respiratory:  clear to auscultation bilaterally  Cardiovascular:  regular rate and rhythm  Gastrointestinal: soft, non-tender; bowel sounds normal; no masses,  no organomegaly.   Skin: Cool and moist.  Left lower leg wound VAC intact with thin serosanguineous output.  Swelling extending to the feet noted.,  Unchanged per patient report  Psychiatric: Normal affect, non-agitated, not confused       LABS:  CMP Latest Ref Rng & Units 02/03/2021 02/02/2021 02/01/2021  Glucose 70 - 99 mg/dL 96 02/03/2021) 469(G)  BUN 8 - 23 mg/dL 20 19 16   Creatinine 0.44 - 1.00 mg/dL 295(M 8.41  Sodium 135 - 145 mmol/L 137 135 138  Potassium 3.5 - 5.1 mmol/L 4.2 3.8  4.1  Chloride 98 - 111 mmol/L 103 101 102  CO2 22 - 32 mmol/L 30 27 28   Calcium 8.9 - 10.3 mg/dL 3.24) 4.01) 8.3(L)  Total Protein 6.5 - 8.1 g/dL - - -  Total Bilirubin 0.3 - 1.2 mg/dL - - -  Alkaline Phos 38 - 126 U/L - - -  AST 15 - 41 U/L - - -  ALT 0 - 44 U/L - - -   CBC Latest Ref Rng & Units 02/03/2021 02/02/2021 02/01/2021  WBC 4.0 - 10.5 K/uL 6.4 6.7 7.3  Hemoglobin 12.0 - 15.0 g/dL 02/05/2021) 02/04/2021) 7.7(L)  Hematocrit 36.0 - 46.0 % 26.4(L) 26.9(L) 23.7(L)  Platelets 150 - 400 K/uL 391 389 433(H)    RADS: N/a Assessment:   Status post debridement wound VAC exchanges in the operating room for left leg necrotizing fasciitis.  Wound vac changed at bedside without any issues.  Ok to d/c from surgery standpoint when supplies ready.  Will perform vac changes in clinic

## 2021-02-03 NOTE — TOC Progression Note (Signed)
Transition of Care [facility]) - Progression Note    Patient Details  Name: Kaylee [name] MRN: [id number] Date of Birth: 1961/05/24  [facility] CM/SW [place], RN Phone Number: 02/03/2021, 1:34 PM  Clinical Narrative:     Met with the patient at the bedside, she will be going to Dr Deniece Ree office Mon, Wed, and Friday to have the wound vac changed, I spoke with Olivia Mackie at 12 M and arranged for the wound vac to be brought to the patient's room, The patient will be going to go home with her daughter and grad daughter, the grand daughter will be picking her up when she gets off work, she needs a RW and a 3 in 1, I notifiexdRhonda with [employer] and it will be brought into the room prior to DC.       Expected Discharge Plan and Services                                                 Social Determinants of Health (SDOH) Interventions    Readmission Risk Interventions No flowsheet data found.

## 2021-02-03 NOTE — TOC Progression Note (Signed)
Transition of Care [facility]) - Progression Note    Patient Details  Name: Kaylee [name] MRN: [id number] Date of Birth: 23-Nov-1960  Transition of Care [facility]) CM/SW Contact  Barrie Dunker, RN Phone Number: 02/03/2021, 2:56 PM  Clinical Narrative:    The patient will fair better at home and getting to the Doctor with a Wheelchair, notified Rhonda at [employer] to bring a wheelchair to the room prior to DC        Expected Discharge Plan and Services                                                 Social Determinants of Health (SDOH) Interventions    Readmission Risk Interventions No flowsheet data found.

## 2021-02-03 NOTE — Consult Note (Addendum)
WOC Nurse Consult Note: Reason for Consult: Surgical team at the bedside for first post-op dressing change.  Pt was medicated for pain prior to the procedure without IV meds and tolerated with mod amt discomfort.  Left outer full thickness post-op wound is beefy red and moist, small amt bloody drainage when sponge removed.  18X6X1cm Left inner full thickness post-op wound is beefy red and moist, small amt bloody drainage when sponge removed, 11X7X1cm Applied Mepitel contact layer to reduce pain with next dressing chage, then one piece black foam to inner and outer wounds, then bridged together to one machine.   There was a mod amt pink drainage in the previous cannister.  Applied xeroform gauze and 4X4s and ace wrap to suture lines to left upper and lower leg and foot.  These are red and well-approximated.  WOC team will plan to change the dressings again on Wed if patient is still in the hospital at that time. One hour spent performing this consult. Cammie Mcgee MSN, RN, CWOCN, [place], CNS [phone]

## 2021-02-04 ENCOUNTER — Telehealth: Payer: Self-pay

## 2021-02-04 NOTE — Progress Notes (Signed)
Patient was discharged on 02/03/2021.  Home wound VAC could not be set up until the following morning.  Per nursing documentation patient was discharged with all post discharge care and instructions on 7/26.  I did not evaluate the patient at bedside this morning.  Lindee Leason [employer]   No charge

## 2021-02-04 NOTE — Telephone Encounter (Signed)
Transition Care Management Follow-up Telephone Call Date of discharge and from where: 02/04/2021, [facility] How have you been since you were released from the hospital? Patient is feeling better. Any questions or concerns? No  Items Reviewed: Did the pt receive and understand the discharge instructions provided? Yes  Medications obtained and verified? Yes  Other? No  Any new allergies since your discharge? No  Dietary orders reviewed? Yes Do you have support at home? Yes   Home Care and Equipment/Supplies: Were home health services ordered? not applicable If so, what is the name of the agency? N/A  Has the agency set up a time to come to the patient's home? not applicable Were any new equipment or medical supplies ordered?  No What is the name of the medical supply agency? N/A Were you able to get the supplies/equipment? not applicable Do you have any questions related to the use of the equipment or supplies? No  Functional Questionnaire: (I = Independent and D = Dependent) ADLs: I  Bathing/Dressing- I  Meal Prep- I  Eating- I  Maintaining continence- I  Transferring/Ambulation- I  Managing Meds- I  Follow up appointments reviewed:  PCP Hospital f/u appt confirmed? No  Will call back and schedule at a later date.  Specialist Hospital f/u appt confirmed? Yes  will call and schedule follow up with Dr. Maia PlanTrisha Mangle. Are transportation arrangements needed? No  If their condition worsens, is the pt aware to call PCP or go to the Emergency Dept.? Yes Was the patient provided with contact information for the PCP's office or ED? Yes Was to pt encouraged to call back with questions or concerns? Yes

## 2021-02-04 NOTE — Progress Notes (Signed)
Pt discharged home with all instructions provided and questions answered. Pt set up with home wound vac, to follow up with general surgery within 1 week. Percocet and Valium returned to patient from pharmacy.  Pt reports some jewelry missing, pre-op and security both aware.   BP (!) 99/59 (BP Location: Left Arm)   Pulse 90   Temp 98.5 F (36.9 C)   Resp 17   Ht 5\' 6"  (1.676 m)   Wt 65.8 kg   SpO2 100%   BMI 23.41 kg/m

## 2021-02-04 NOTE — Plan of Care (Signed)
  Problem: Education: Goal: Knowledge of General Education information will improve Description: Including pain rating scale, medication(s)/side effects and non-pharmacologic comfort measures Outcome: Completed/Met   Problem: Health Behavior/Discharge Planning: Goal: Ability to manage health-related needs will improve Outcome: Completed/Met   Problem: Clinical Measurements: Goal: Ability to maintain clinical measurements within normal limits will improve Outcome: Completed/Met Goal: Will remain free from infection Outcome: Completed/Met Goal: Diagnostic test results will improve Outcome: Completed/Met Goal: Respiratory complications will improve Outcome: Completed/Met Goal: Cardiovascular complication will be avoided Outcome: Completed/Met   Problem: Activity: Goal: Risk for activity intolerance will decrease Outcome: Completed/Met   Problem: Nutrition: Goal: Adequate nutrition will be maintained Outcome: Completed/Met   Problem: Coping: Goal: Level of anxiety will decrease Outcome: Completed/Met   Problem: Elimination: Goal: Will not experience complications related to bowel motility Outcome: Completed/Met Goal: Will not experience complications related to urinary retention Outcome: Completed/Met   Problem: Pain Managment: Goal: General experience of comfort will improve Outcome: Completed/Met   Problem: Safety: Goal: Ability to remain free from injury will improve Outcome: Completed/Met   Problem: Skin Integrity: Goal: Risk for impaired skin integrity will decrease Outcome: Completed/Met   Problem: Skin Integrity: Goal: Skin integrity will improve Outcome: Completed/Met

## 2021-02-07 ENCOUNTER — Other Ambulatory Visit: Payer: Self-pay | Admitting: Family Medicine

## 2021-02-07 ENCOUNTER — Encounter: Payer: Self-pay | Admitting: General Surgery

## 2021-02-10 DIAGNOSIS — T8189XA Other complications of procedures, not elsewhere classified, initial encounter: Secondary | ICD-10-CM | POA: Diagnosis not present

## 2021-02-10 DIAGNOSIS — M726 Necrotizing fasciitis: Secondary | ICD-10-CM | POA: Diagnosis not present

## 2021-02-10 DIAGNOSIS — S81829A Laceration with foreign body, unspecified lower leg, initial encounter: Secondary | ICD-10-CM | POA: Diagnosis not present

## 2021-02-10 NOTE — Telephone Encounter (Signed)
Name of Medication: Alprazolam, Diazepam Name of Pharmacy: [facility] Last [place] or Written Date and Quantity: 01/07/21      Alprazolam- #10      Dizepam- #60      Oxycodone-APAP- 02/03/21, #21 (for 7 days) Last Office Visit and Type: 12/10/20, 3 mo pain mgmt f/u Next Office Visit and Type: 03/12/21, 3 mo pain mgmt f/u Last Controlled Substance Agreement Date: 09/06/20 Last UDS: 09/06/20

## 2021-02-12 ENCOUNTER — Telehealth: Payer: Self-pay | Admitting: Family Medicine

## 2021-02-12 DIAGNOSIS — M726 Necrotizing fasciitis: Secondary | ICD-10-CM | POA: Diagnosis not present

## 2021-02-12 NOTE — Telephone Encounter (Signed)
See Refill note, 02/07/21.

## 2021-02-12 NOTE — Telephone Encounter (Signed)
Patient call in requesting to ha medication refill Oxycodone 7.5 gm   Encourage patient to contact the pharmacy for refills or they can request refills through [facility]  LAST APPOINTMENT DATE:  Please schedule appointment if longer than 1 year  NEXT APPOINTMENT DATE:  MEDICATION:  ALPRAZolam (XANAX) 1 MG tablet   diazepam (VALIUM) 10 MG table Is the patient out of medication?   PHARMACY:[facility] [place]  Let patient know to contact pharmacy at the end of the day to make sure medication is ready.  Please notify patient to allow 48-72 hours to process  CLINICAL FILLS OUT ALL BELOW:   LAST REFILL:  QTY:  REFILL DATE:    OTHER COMMENTS:    Okay for refill?  Please advise

## 2021-02-13 ENCOUNTER — Ambulatory Visit (INDEPENDENT_AMBULATORY_CARE_PROVIDER_SITE_OTHER): Payer: Medicare Other | Admitting: Plastic Surgery

## 2021-02-13 ENCOUNTER — Other Ambulatory Visit: Payer: Self-pay

## 2021-02-13 ENCOUNTER — Encounter: Payer: Self-pay | Admitting: Plastic Surgery

## 2021-02-13 ENCOUNTER — Other Ambulatory Visit: Payer: Self-pay | Admitting: Family Medicine

## 2021-02-13 VITALS — BP 107/74 | HR 85 | Ht 66.0 in | Wt 155.0 lb

## 2021-02-13 DIAGNOSIS — L03116 Cellulitis of left lower limb: Secondary | ICD-10-CM
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
## 2021-02-13 NOTE — Telephone Encounter (Signed)
ERx 

## 2021-02-14 NOTE — Telephone Encounter (Signed)
Colchicine last filled: 01/08/21, #30 Linzess last filled:  01/08/21, #30 Robaxin last filled:  01/08/21, #60 Last OV:  12/10/20, 3 mo chronic pain mgmt Next OV:  03/12/21, 3 mo chronic pain mgmt

## 2021-02-17 DIAGNOSIS — M726 Necrotizing fasciitis: Secondary | ICD-10-CM | POA: Diagnosis not present

## 2021-02-17 NOTE — Telephone Encounter (Signed)
ERx 

## 2021-02-18 ENCOUNTER — Other Ambulatory Visit: Payer: Self-pay

## 2021-02-19 ENCOUNTER — Encounter: Payer: Self-pay | Admitting: General Surgery

## 2021-02-20 DIAGNOSIS — M726 Necrotizing fasciitis: Secondary | ICD-10-CM | POA: Diagnosis not present

## 2021-02-22 ENCOUNTER — Emergency Department
Admission: EM | Admit: 2021-02-22 | Discharge: 2021-02-22 | Disposition: A | Discharge status: Home / Self Care | Payer: Medicare Other | Attending: Emergency Medicine | Admitting: Emergency Medicine

## 2021-02-22 ENCOUNTER — Other Ambulatory Visit: Payer: Self-pay

## 2021-02-22 DIAGNOSIS — Z7951 Long term (current) use of inhaled steroids: Secondary | ICD-10-CM | POA: Diagnosis not present

## 2021-02-22 DIAGNOSIS — J449 Chronic obstructive pulmonary disease, unspecified: Secondary | ICD-10-CM | POA: Insufficient documentation

## 2021-02-22 DIAGNOSIS — Z4801 Encounter for change or removal of surgical wound dressing: Secondary | ICD-10-CM | POA: Insufficient documentation

## 2021-02-22 DIAGNOSIS — J45909 Unspecified asthma, uncomplicated: Secondary | ICD-10-CM | POA: Diagnosis not present

## 2021-02-22 DIAGNOSIS — Z4689 Encounter for fitting and adjustment of other specified devices: Secondary | ICD-10-CM

## 2021-02-22 DIAGNOSIS — R600 Localized edema: Secondary | ICD-10-CM | POA: Diagnosis not present

## 2021-02-22 DIAGNOSIS — M79662 Pain in left lower leg: Secondary | ICD-10-CM | POA: Diagnosis not present

## 2021-02-22 DIAGNOSIS — Z87891 Personal history of nicotine dependence: Secondary | ICD-10-CM | POA: Insufficient documentation
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
## 2021-02-22 NOTE — ED Notes (Signed)
RN to bedside to introduce self to pt. Pt advised she is here just to have some dressing assistance on her wound vac. Pt is CAOx4 and in no acute distress.

## 2021-02-22 NOTE — ED Notes (Signed)
This RN placed multiple clear dressings on leg as directed to by pt. Then wrapped in elastic bandage so she is clear to go.

## 2021-02-22 NOTE — ED Triage Notes (Signed)
Pt to ER via POV. Presents with a wound vac in place on left lower leg. Pt reports she was previously diagnosed with cellulitis, states she is going to have skin grafts placed on Tuesday but today woke up and the dressing on her leg has come loose. States that surgery didn't provide her with any wound care supplies or information and that the dressing is falling off and now the dressing around her heel is leaking. Wants the dressing to be replaced today so that she can make it to tuesday.   Denies any other complaints.

## 2021-02-25 ENCOUNTER — Ambulatory Visit (HOSPITAL_BASED_OUTPATIENT_CLINIC_OR_DEPARTMENT_OTHER): Payer: Medicare Other | Admitting: Certified Registered"

## 2021-02-25 ENCOUNTER — Encounter (HOSPITAL_BASED_OUTPATIENT_CLINIC_OR_DEPARTMENT_OTHER)
Admission: RE | Disposition: A | Discharge status: Home / Self Care | Payer: Self-pay | Source: Home / Self Care | Attending: Plastic Surgery

## 2021-02-25 ENCOUNTER — Encounter (HOSPITAL_BASED_OUTPATIENT_CLINIC_OR_DEPARTMENT_OTHER): Payer: Self-pay | Admitting: Plastic Surgery

## 2021-02-25 ENCOUNTER — Ambulatory Visit (HOSPITAL_BASED_OUTPATIENT_CLINIC_OR_DEPARTMENT_OTHER)
Admission: RE | Admit: 2021-02-25 | Discharge: 2021-02-25 | Disposition: A | Discharge status: Home / Self Care | Payer: Medicare Other | Attending: Plastic Surgery | Admitting: Plastic Surgery

## 2021-02-25 ENCOUNTER — Other Ambulatory Visit: Payer: Self-pay

## 2021-02-25 DIAGNOSIS — K219 Gastro-esophageal reflux disease without esophagitis: Secondary | ICD-10-CM | POA: Diagnosis not present

## 2021-02-25 DIAGNOSIS — E785 Hyperlipidemia, unspecified: Secondary | ICD-10-CM | POA: Diagnosis not present

## 2021-02-25 DIAGNOSIS — Z79899 Other long term (current) drug therapy: Secondary | ICD-10-CM | POA: Insufficient documentation

## 2021-02-25 DIAGNOSIS — Z888 Allergy status to other drugs, medicaments and biological substances status: Secondary | ICD-10-CM | POA: Insufficient documentation

## 2021-02-25 DIAGNOSIS — L03116 Cellulitis of left lower limb: Secondary | ICD-10-CM | POA: Diagnosis not present

## 2021-02-25 DIAGNOSIS — L089 Local infection of the skin and subcutaneous tissue, unspecified: Secondary | ICD-10-CM | POA: Diagnosis not present

## 2021-02-25 DIAGNOSIS — Z885 Allergy status to narcotic agent status: Secondary | ICD-10-CM | POA: Diagnosis not present

## 2021-02-25 HISTORY — PX: IRRIGATION AND DEBRIDEMENT OF WOUND WITH SPLIT THICKNESS SKIN GRAFT: SHX5879

## 2021-02-25 HISTORY — PX: APPLICATION OF WOUND VAC: SHX5189

## 2021-02-25 SURGERY — IRRIGATION AND DEBRIDEMENT OF WOUND WITH SPLIT THICKNESS SKIN GRAFT
Anesthesia: General | Site: Leg Lower | Laterality: Left

## 2021-02-25 MED ORDER — LACTATED RINGERS IV SOLN
INTRAVENOUS | Status: DC | PRN
  Administered 2021-02-25: 200 mL

## 2021-02-25 MED ORDER — OXYCODONE HCL 5 MG/5ML PO SOLN: 5.0000 mg | Freq: Once | ORAL | Status: AC | PRN

## 2021-02-25 MED ORDER — OXYCODONE HCL 5 MG PO TABS
5.0000 mg | ORAL_TABLET | Freq: Once | ORAL | Status: AC | PRN
Start: 2021-02-25 — End: 2021-02-25
  Administered 2021-02-25: 5 mg via ORAL

## 2021-02-25 MED ORDER — LACTATED RINGERS IV SOLN: INTRAVENOUS | Status: DC

## 2021-02-25 MED ORDER — FENTANYL CITRATE (PF) 100 MCG/2ML IJ SOLN
INTRAMUSCULAR | Status: DC | PRN
  Administered 2021-02-25 (×4): 25 ug via INTRAVENOUS

## 2021-02-25 MED ORDER — PHENYLEPHRINE 40 MCG/ML (10ML) SYRINGE FOR IV PUSH (FOR BLOOD PRESSURE SUPPORT)
PREFILLED_SYRINGE | INTRAVENOUS | Status: AC
  Filled 2021-02-25: qty 10

## 2021-02-25 MED ORDER — MIDAZOLAM HCL 5 MG/5ML IJ SOLN
INTRAMUSCULAR | Status: DC | PRN
  Administered 2021-02-25: 2 mg via INTRAVENOUS

## 2021-02-25 MED ORDER — PROPOFOL 10 MG/ML IV BOLUS
INTRAVENOUS | Status: DC | PRN
  Administered 2021-02-25: 200 mg via INTRAVENOUS

## 2021-02-25 MED ORDER — DEXAMETHASONE SODIUM PHOSPHATE 4 MG/ML IJ SOLN
INTRAMUSCULAR | Status: DC | PRN
  Administered 2021-02-25: 4 mg via INTRAVENOUS

## 2021-02-25 MED ORDER — OXYCODONE HCL 5 MG PO TABS
ORAL_TABLET | ORAL | Status: AC
  Filled 2021-02-25: qty 1

## 2021-02-25 MED ORDER — DEXMEDETOMIDINE (PRECEDEX) IN NS 20 MCG/5ML (4 MCG/ML) IV SYRINGE
PREFILLED_SYRINGE | INTRAVENOUS | Status: AC
  Filled 2021-02-25: qty 5

## 2021-02-25 MED ORDER — MINERAL OIL LIGHT 100 % EX OIL
TOPICAL_OIL | CUTANEOUS | Status: DC | PRN
  Administered 2021-02-25: 1 via TOPICAL

## 2021-02-25 MED ORDER — CEFAZOLIN SODIUM-DEXTROSE 2-4 GM/100ML-% IV SOLN
INTRAVENOUS | Status: AC
  Filled 2021-02-25: qty 100

## 2021-02-25 MED ORDER — HYDROMORPHONE HCL 1 MG/ML IJ SOLN
INTRAMUSCULAR | Status: AC
  Filled 2021-02-25: qty 0.5

## 2021-02-25 MED ORDER — AMISULPRIDE (ANTIEMETIC) 5 MG/2ML IV SOLN: 10.0000 mg | Freq: Once | INTRAVENOUS | Status: DC | PRN

## 2021-02-25 MED ORDER — DEXMEDETOMIDINE (PRECEDEX) IN NS 20 MCG/5ML (4 MCG/ML) IV SYRINGE
PREFILLED_SYRINGE | INTRAVENOUS | Status: DC | PRN
  Administered 2021-02-25: 4 ug via INTRAVENOUS
  Administered 2021-02-25 (×2): 8 ug via INTRAVENOUS

## 2021-02-25 MED ORDER — LIDOCAINE 2% (20 MG/ML) 5 ML SYRINGE
INTRAMUSCULAR | Status: DC | PRN
  Administered 2021-02-25: 80 mg via INTRAVENOUS

## 2021-02-25 MED ORDER — HYDROMORPHONE HCL 1 MG/ML IJ SOLN
0.2500 mg | INTRAMUSCULAR | Status: DC | PRN
  Administered 2021-02-25 (×3): 0.5 mg via INTRAVENOUS

## 2021-02-25 MED ORDER — MEPERIDINE HCL 25 MG/ML IJ SOLN: 6.2500 mg | INTRAMUSCULAR | Status: DC | PRN

## 2021-02-25 MED ORDER — PHENYLEPHRINE 40 MCG/ML (10ML) SYRINGE FOR IV PUSH (FOR BLOOD PRESSURE SUPPORT)
PREFILLED_SYRINGE | INTRAVENOUS | Status: DC | PRN
  Administered 2021-02-25: 120 ug via INTRAVENOUS
  Administered 2021-02-25 (×2): 80 ug via INTRAVENOUS
  Administered 2021-02-25: 120 ug via INTRAVENOUS

## 2021-02-25 MED ORDER — MIDAZOLAM HCL 2 MG/2ML IJ SOLN
INTRAMUSCULAR | Status: AC
  Filled 2021-02-25: qty 2

## 2021-02-25 MED ORDER — FENTANYL CITRATE (PF) 100 MCG/2ML IJ SOLN
INTRAMUSCULAR | Status: AC
  Filled 2021-02-25: qty 2

## 2021-02-25 MED ORDER — MINERAL OIL LIGHT OIL
TOPICAL_OIL | Status: AC
  Filled 2021-02-25: qty 20

## 2021-02-25 MED ORDER — CEFAZOLIN SODIUM-DEXTROSE 2-4 GM/100ML-% IV SOLN
2.0000 g | INTRAVENOUS | Status: AC
  Administered 2021-02-25: 2 g via INTRAVENOUS

## 2021-02-25 MED ORDER — BUPIVACAINE HCL (PF) 0.25 % IJ SOLN
INTRAMUSCULAR | Status: AC
  Filled 2021-02-25: qty 30

## 2021-02-25 MED ORDER — OXYCODONE-ACETAMINOPHEN 7.5-325 MG PO TABS: 1.0000 | ORAL_TABLET | ORAL | 0 refills | Status: DC | PRN

## 2021-02-25 MED ORDER — EPINEPHRINE PF 1 MG/ML IJ SOLN
INTRAMUSCULAR | Status: AC
  Filled 2021-02-25: qty 1

## 2021-02-25 MED ORDER — PROMETHAZINE HCL 25 MG/ML IJ SOLN: 6.2500 mg | INTRAMUSCULAR | Status: DC | PRN

## 2021-02-25 MED ORDER — EPHEDRINE 5 MG/ML INJ
INTRAVENOUS | Status: AC
  Filled 2021-02-25: qty 10

## 2021-02-25 MED ORDER — SUCCINYLCHOLINE CHLORIDE 200 MG/10ML IV SOSY
PREFILLED_SYRINGE | INTRAVENOUS | Status: AC
  Filled 2021-02-25: qty 10

## 2021-02-25 MED ORDER — ONDANSETRON HCL 4 MG/2ML IJ SOLN
INTRAMUSCULAR | Status: DC | PRN
  Administered 2021-02-25: 4 mg via INTRAVENOUS

## 2021-02-25 MED ORDER — PROPOFOL 500 MG/50ML IV EMUL
INTRAVENOUS | Status: AC
  Filled 2021-02-25: qty 50

## 2021-02-25 SURGICAL SUPPLY — 54 items
BALL CTTN LRG ABS STRL LF (GAUZE/BANDAGES/DRESSINGS) ×1
BLADE DERMATOME SS (BLADE) ×1 IMPLANT
BLADE SURG 10 STRL SS (BLADE) IMPLANT
BLADE SURG 15 STRL LF DISP TIS (BLADE) ×1 IMPLANT
BLADE SURG 15 STRL SS (BLADE) ×2
BNDG CMPR MED 10X6 ELC LF (GAUZE/BANDAGES/DRESSINGS)
BNDG COHESIVE 4X5 TAN ST LF (GAUZE/BANDAGES/DRESSINGS) IMPLANT
BNDG ELASTIC 4X5.8 VLCR STR LF (GAUZE/BANDAGES/DRESSINGS) ×1 IMPLANT
BNDG ELASTIC 6X10 VLCR STRL LF (GAUZE/BANDAGES/DRESSINGS) IMPLANT
BNDG GAUZE ELAST 4 BULKY (GAUZE/BANDAGES/DRESSINGS) ×1 IMPLANT
BRUSH SCRUB EZ PLAIN DRY (MISCELLANEOUS) ×1 IMPLANT
CANISTER SUCT 1200ML W/VALVE (MISCELLANEOUS) ×1 IMPLANT
COTTONBALL LRG STERILE PKG (GAUZE/BANDAGES/DRESSINGS) ×3 IMPLANT
COVER BACK TABLE 60X90IN (DRAPES) ×2 IMPLANT
COVER MAYO STAND STRL (DRAPES) ×2 IMPLANT
DEPRESSOR TONGUE BLADE STERILE (MISCELLANEOUS) ×2 IMPLANT
DERMACARRIERS GRAFT 1 TO 1.5 (DISPOSABLE) ×2 IMPLANT
DRAPE INCISE IOBAN 66X45 STRL (DRAPES) ×2 IMPLANT
DRAPE U-SHAPE 76X120 STRL (DRAPES) IMPLANT
DRSG PAD ABDOMINAL 8X10 ST (GAUZE/BANDAGES/DRESSINGS) ×1 IMPLANT
DRSG VAC ATS MED SENSATRAC (GAUZE/BANDAGES/DRESSINGS) ×1 IMPLANT
ELECT REM PT RETURN 9FT ADLT (ELECTROSURGICAL) ×2 IMPLANT
ELECTRODE REM PT RTRN 9FT ADLT (ELECTROSURGICAL) IMPLANT
GAUZE 4X4 16PLY ~~LOC~~+RFID DBL (SPONGE) IMPLANT
GAUZE SPONGE 4X4 12PLY STRL (GAUZE/BANDAGES/DRESSINGS) ×4 IMPLANT
GAUZE XEROFORM 5X9 LF (GAUZE/BANDAGES/DRESSINGS) ×2 IMPLANT
GLOVE SRG 8 PF TXTR STRL LF DI (GLOVE) ×1 IMPLANT
GLOVE SURG ENC TEXT LTX SZ7.5 (GLOVE) ×3 IMPLANT
GLOVE SURG UNDER POLY LF SZ8 (GLOVE) ×2
GOWN STRL REUS W/ TWL LRG LVL3 (GOWN DISPOSABLE) ×1 IMPLANT
GOWN STRL REUS W/TWL LRG LVL3 (GOWN DISPOSABLE) ×2
GRAFT DERMACARRIERS 1 TO 1.5 (DISPOSABLE) IMPLANT
MATRIX TISSUE MESHED 4X5 (Tissue) ×1 IMPLANT
NDL SPNL 18GX3.5 QUINCKE PK (NEEDLE) IMPLANT
NEEDLE SPNL 18GX3.5 QUINCKE PK (NEEDLE) ×2 IMPLANT
NS IRRIG 1000ML POUR BTL (IV SOLUTION) ×3 IMPLANT
PACK BASIN DAY SURGERY FS (CUSTOM PROCEDURE TRAY) ×2 IMPLANT
PENCIL SMOKE EVACUATOR (MISCELLANEOUS) IMPLANT
SHEET MEDIUM DRAPE 40X70 STRL (DRAPES) ×1 IMPLANT
SPONGE T-LAP 18X18 ~~LOC~~+RFID (SPONGE) ×1 IMPLANT
STAPLER VISISTAT 35W (STAPLE) ×2 IMPLANT
SUT CHROMIC 5 0 P 3 (SUTURE) IMPLANT
SUT ETHILON 4 0 P 3 18 (SUTURE) IMPLANT
SUT MON AB 4-0 PC3 18 (SUTURE) IMPLANT
SUT MON AB 5-0 PS2 18 (SUTURE) IMPLANT
SUT PROLENE 4 0 P 3 18 (SUTURE) IMPLANT
SUT VIC AB 3-0 FS2 27 (SUTURE) IMPLANT
SUT VICRYL 4-0 PS2 18IN ABS (SUTURE) IMPLANT
SYR BULB EAR ULCER 3OZ GRN STR (SYRINGE) ×2 IMPLANT
TOWEL GREEN STERILE FF (TOWEL DISPOSABLE) ×2 IMPLANT
TRAY DSU PREP LF (CUSTOM PROCEDURE TRAY) ×2 IMPLANT
TUBE CONNECTING 20X1/4 (TUBING) IMPLANT
UNDERPAD 30X36 HEAVY ABSORB (UNDERPADS AND DIAPERS) IMPLANT
YANKAUER SUCT BULB TIP NO VENT (SUCTIONS) ×1 IMPLANT

## 2021-02-25 NOTE — Discharge Instructions (Addendum)
Activity: As tolerated, but avoid strenuous activity until follow up visit.  Diet: Regular  Wound Care: Keep dressing clean & dry until follow up. Wound vac does not need to be changed until follow up.  Special Instructions:  Call our office if any unusual problems occur such as pain, excessive bleeding, unrelieved nausea/vomiting, fever &/or chills.  Follow-up appointment: Scheduled for next week.   Post Anesthesia Home Care Instructions  Activity: Get plenty of rest for the remainder of the day. A responsible individual must stay with you for 24 hours following the procedure.  For the next 24 hours, DO NOT: -Drive a car -Advertising copywriter -Drink alcoholic beverages -Take any medication unless instructed by your physician -Make any legal decisions or sign important papers.  Meals: Start with liquid foods such as gelatin or soup. Progress to regular foods as tolerated. Avoid greasy, spicy, heavy foods. If nausea and/or vomiting occur, drink only clear liquids until the nausea and/or vomiting subsides. Call your physician if vomiting continues.  Special Instructions/Symptoms: Your throat may feel dry or sore from the anesthesia or the breathing tube placed in your throat during surgery. If this causes discomfort, gargle with warm salt water. The discomfort should disappear within 24 hours.  If you had a scopolamine patch placed behind your ear for the management of post- operative nausea and/or vomiting:  1. The medication in the patch is effective for 72 hours, after which it should be removed.  Wrap patch in a tissue and discard in the trash. Wash hands thoroughly with soap and water. 2. You may remove the patch earlier than 72 hours if you experience unpleasant side effects which may include dry mouth, dizziness or visual disturbances. 3. Avoid touching the patch. Wash your hands with soap and water after contact with the patch.         You had 5 mg of Oxycodone at 3:00  PM.

## 2021-02-25 NOTE — Anesthesia Procedure Notes (Signed)
Procedure Name: LMA Insertion Date/Time: 02/25/2021 12:21 PM Performed by: Pearson Grippe, CRNA Pre-anesthesia Checklist: Patient identified, Emergency Drugs available, Suction available and Patient being monitored Patient Re-evaluated:Patient Re-evaluated prior to induction Oxygen Delivery Method: Circle System Utilized Preoxygenation: Pre-oxygenation with 100% oxygen Induction Type: IV induction Ventilation: Mask ventilation without difficulty LMA: LMA inserted LMA Size: 4.0 Number of attempts: 1 Airway Equipment and Method: bite block Placement Confirmation: positive ETCO2 Tube secured with: Tape Dental Injury: Teeth and Oropharynx as per pre-operative assessment

## 2021-02-25 NOTE — Transfer of Care (Signed)
Immediate Anesthesia Transfer of Care Note  Patient: Kaylee [name]  Procedure(s) Performed: Debridement left lower extremity wounds and placement of split-thickness skin graft (Left: Leg Lower) Application of Integra Bilayer (Left: Leg Lower) APPLICATION OF WOUND VAC (Left: Leg Lower)  Patient Location: PACU  Anesthesia Type:General  Level of Consciousness: awake, alert  and oriented  Airway & Oxygen Therapy: Patient Spontanous Breathing and Patient connected to face mask oxygen  Post-op Assessment: Report given to RN and Post -op Vital signs reviewed and stable  Post vital signs: Reviewed and stable  Last Vitals:  Vitals Value Taken Time  BP 117/74 02/25/21 1333  Temp    Pulse 74 02/25/21 1334  Resp 14 02/25/21 1334  SpO2 100 % 02/25/21 1334  Vitals shown include unvalidated device data.  Last Pain:  Vitals:   02/25/21 1008  TempSrc: Oral  PainSc: 7       Patients Stated Pain Goal: 7 (02/25/21 1008)  Complications: No notable events documented.
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
## 2021-02-25 NOTE — Op Note (Signed)
Operative Note   DATE OF OPERATION: 02/25/2021  SURGICAL DEPARTMENT: Plastic Surgery  PREOPERATIVE DIAGNOSES: Left lower extremity wounds  POSTOPERATIVE DIAGNOSES:  same  PROCEDURE: 1.  Debridement of left lower extremity wounds totaling 20 x 8 cm laterally and 12 x 6 cm medially including skin and subcutaneous tissue 2.  Split-thickness skin graft to lateral left lower extremity wound totaling 20 x 8 cm 3.  Integra skin substitute placement to medial left lower extremity wound totaling 12 x 6 cm 4.  Wound VAC application left lower extremity wounds totaling 20 x 8 cm and 12 x 6 cm  SURGEON: Ancil Linsey, MD  ASSISTANT: Enedina Finner, RNFA The advanced practice practitioner (APP) assisted throughout the case.  The APP was essential in retraction and counter traction when needed to make the case progress smoothly.  This retraction and assistance made it possible to see the tissue plans for the procedure.  The assistance was needed for blood control, tissue re-approximation and assisted with closure of the incision site.  ANESTHESIA:  General.   COMPLICATIONS: None.   INDICATIONS FOR PROCEDURE:  The patient, Kaylee [name] is a 60 y.o. female born on 08-28-1960, is here for treatment of left lower extremity wounds MRN: [id number]  CONSENT:  Informed consent was obtained directly from the patient. Risks, benefits and alternatives were fully discussed. Specific risks including but not limited to bleeding, infection, hematoma, seroma, scarring, pain, contracture, asymmetry, wound healing problems, and need for further surgery were all discussed. The patient did have an ample opportunity to have questions answered to satisfaction.   DESCRIPTION OF PROCEDURE:  The patient was taken to the operating room. SCDs were placed and antibiotics were given.  General anesthesia was administered.  The patient's operative site was prepped and draped in a sterile fashion. A time out was performed and  all information was confirmed to be correct.  Start by evaluating both wounds.  The lateral wound was completely covered with granulation tissue that looked healthy.  The medial wound had a approximately 3 cm area of exposed Achilles tendon with the remainder of the wound having healthy granulation tissue at the base.  I debrided both wounds with a scrub brush sponge and the bevel of a 10 blade.  I did remove some fibrinous debris over the tendon and a little bit of the Achilles tendon that appeared nonviable with pickups and a 10 blade.  At this point I irrigated both wounds with saline solution.  I then measured out a corresponding sized area in the left thigh for the lateral wound infused tumescent solution.  Skin graft was then harvested with a dermatome at 14 thousandths of an inch.  This was meshed 1.5-1 and inset with staples in the lateral wound.  I then brought a piece of Integra meshed bilayer onto the field and soaked this in saline.  This was stapled in place of the medial wound.  A wound VAC sponge was then cut and placed over the Integra.  Mepitel was placed over the skin graft followed by black wound VAC sponge which were then bridged together with Mepitel to protect the skin over the skin bridge.  [place] was then used to form a seal and the wound VAC was attached with a good seal.  Donor site from the thigh was dressed with Xeroform and soft dressings.  The patient tolerated the procedure well.  There were no complications. The patient was allowed to wake from anesthesia, extubated and taken to the recovery  room in satisfactory condition.

## 2021-02-25 NOTE — Interval H&P Note (Signed)
History and Physical Interval Note:  02/25/2021 10:25 AM  Kaylee [name]  has presented today for surgery, with the diagnosis of Cellulitis of left leg.  The various methods of treatment have been discussed with the patient and family. After consideration of risks, benefits and other options for treatment, the patient has consented to  Procedure(s): Debridement left lower extremity wounds and placement of split-thickness skin graft (Left) Application of Integra Bilayer (Left) APPLICATION OF WOUND VAC (Left) as a surgical intervention.  The patient's history has been reviewed, patient examined, no change in status, stable for surgery.  I have reviewed the patient's chart and labs.  Questions were answered to the patient's satisfaction.     Allena Napoleon

## 2021-02-25 NOTE — Anesthesia Postprocedure Evaluation (Signed)
Anesthesia Post Note  Patient: Adaly A Vanderwoude  Procedure(s) Performed: Debridement left lower extremity wound and placement of split-thickness skin graft (Left: Leg Lower) Application of Integra Bilayer (Left: Leg Lower) APPLICATION OF WOUND VAC (Left: Leg Lower)     Patient location during evaluation: PACU Anesthesia Type: General Level of consciousness: awake and alert Pain management: pain level controlled Vital Signs Assessment: post-procedure vital signs reviewed and stable Respiratory status: spontaneous breathing, nonlabored ventilation and respiratory function stable Cardiovascular status: blood pressure returned to baseline and stable Postop Assessment: no apparent nausea or vomiting Anesthetic complications: no   No notable events documented.  Last Vitals:  Vitals:   02/25/21 1430 02/25/21 1505  BP: 112/70 115/64  Pulse: 74 77  Resp: 15 16  Temp:  36.5 C  SpO2: 92% 97%    Last Pain:  Vitals:   02/25/21 1453  TempSrc:   PainSc: 4                  Lowella Curb

## 2021-02-25 NOTE — Brief Op Note (Signed)
02/25/2021  1:31 PM  PATIENT:  Kaylee [name]  60 y.o. female  PRE-OPERATIVE DIAGNOSIS:  Cellulitis of left leg  POST-OPERATIVE DIAGNOSIS:  Cellulitis of left leg  PROCEDURE:  Procedure(s): Debridement left lower extremity wounds and placement of split-thickness skin graft (Left) Application of Integra Bilayer (Left) APPLICATION OF WOUND VAC (Left)  SURGEON:  Surgeon(s) and Role:    * Lulani Bour, Wendy Poet, MD - Primary  PHYSICIAN ASSISTANT: Enedina Finner, RNFA  ASSISTANTS: none   ANESTHESIA:   general  EBL:  5 mL   BLOOD ADMINISTERED:none  DRAINS: none   LOCAL MEDICATIONS USED:  MARCAINE     SPECIMEN:  No Specimen  DISPOSITION OF SPECIMEN:  PATHOLOGY  COUNTS:  YES  TOURNIQUET:  * Missing tourniquet times found for documented tourniquets in log: [id number] *  DICTATION: .Dragon Dictation  PLAN OF CARE: Discharge to home after PACU  PATIENT DISPOSITION:  PACU - hemodynamically stable.   Delay start of Pharmacological VTE agent (>24hrs) due to surgical blood loss or risk of bleeding: not applicable

## 2021-02-27 ENCOUNTER — Encounter (HOSPITAL_BASED_OUTPATIENT_CLINIC_OR_DEPARTMENT_OTHER): Payer: Self-pay | Admitting: Plastic Surgery

## 2021-03-03 ENCOUNTER — Telehealth: Payer: Self-pay | Admitting: Plastic Surgery

## 2021-03-03 NOTE — Telephone Encounter (Addendum)
Called and spoke with the patient regarding the message below.  Patient stated that the areas above the knee is leaking and it's not stopping.  She stated that lower leg has a wound vac on it,but it's doing good.  She was told not to touch it.    Asked the patient if she had any of the symptoms:fever,chills, nausea,vomiting).  Patient stated that she was not having any of the symptoms.    Asked the patient if it was the donor site that is leaking.  She stated yes.  Asked her what does she have on it, and she stated what was placed on it during surgery.  Asked her if she had any Xeroform and she stated no she did not get anything to put on it.   Informed the patient that the area is like a scrape and she can use gauze on it and wrap it up with a ace wrap to put some press on it to help with the drainage.   Informed the patient that we will check the area when she comes in on (Thurs.-03/06/21) for her follow-up appointment.  Patient verbalized understanding and agreed.//AB/CMA

## 2021-03-03 NOTE — Telephone Encounter (Signed)
Patient reports excessive leaking from leg wound. Sx 8/16. Please call to advise [phone]. Thank you.

## 2021-03-05 ENCOUNTER — Other Ambulatory Visit: Payer: Self-pay | Admitting: Family Medicine

## 2021-03-05 ENCOUNTER — Telehealth: Payer: Self-pay

## 2021-03-05 NOTE — Telephone Encounter (Signed)
Received voicemail from [place] with [employer] requesting information on whether wound Vac has been resumed and wound measurements. His number is  [phone] ext [phone] and fax is [phone].

## 2021-03-05 NOTE — Telephone Encounter (Signed)
Wound measurements can be given as they were taken at last ov with Dr. Arita Miss however, it was not documented whether wound vac would be removed/resumed. The note stated that pt has been going to Dr. Hazle Quant x wound vac dressing changes.   Pt saw Dr. Trisha Mangle on 8/11 x wound vac dressing changes.   Pt comes in tomorrow x a f/u and I will ask Dr. Arita Miss if wound vac will be stopped or not.

## 2021-03-06 ENCOUNTER — Telehealth: Payer: Self-pay

## 2021-03-06 ENCOUNTER — Other Ambulatory Visit: Payer: Self-pay

## 2021-03-06 ENCOUNTER — Ambulatory Visit (INDEPENDENT_AMBULATORY_CARE_PROVIDER_SITE_OTHER): Payer: Medicare Other | Admitting: Plastic Surgery

## 2021-03-06 DIAGNOSIS — L03116 Cellulitis of left lower limb: Secondary | ICD-10-CM

## 2021-03-06 NOTE — Telephone Encounter (Signed)
Faxed order and financial aid form to [facility]: LLE lateral and medial:  Xeroform, 4x4, ABD pads, Kerlix and ace wrap to be changed every day.  Called patient, advised per Dr. Arita Miss to turn in wound vac to [facility].  Patient understood and agreed with plan.

## 2021-03-06 NOTE — Progress Notes (Signed)
Patient presents 1 week postop from a split-thickness skin graft to the left lateral leg and Integra placement to the left medial leg.  She feels like she is can along okay.  Skin graft donor site looks like is healing appropriately.  Skin graft on the lateral aspect looks like a complete take.  I am optimistic about the Integra medially but will give this some time.  The logistics of the wound VAC are becoming pretty difficult at this point as she cannot qualify for any type of home health.  We will change to Xeroform dressings with a wrap to the leg and I think now with the skin graft and the Integra placed that is a reasonable option for her.  I will plan to see her again in 2 weeks to check her progress.  All of her questions were answered.

## 2021-03-06 NOTE — Telephone Encounter (Signed)
Last OV - 12/10/2020  Next OV - 03/21/2021  Last Filled - OXYCODONE-APAP 7.5-325 MG TAB  02/25/2021  ALPRAZOLAM 1 MG TAB 02/13/2021  DIAZEPAM 10 MG TAB 02/13/2021

## 2021-03-06 NOTE — Telephone Encounter (Signed)
Spoke to [employer] and gave her the information that [place] requested. She also requested that the ov notes be faxed.   Adv that I would fax today; she confirmed that fax # given by Tammy Sours in previous note was correct.   Received fax confirmation @ 5:14 pm.

## 2021-03-07 DIAGNOSIS — L03116 Cellulitis of left lower limb: Secondary | ICD-10-CM | POA: Diagnosis not present

## 2021-03-10 ENCOUNTER — Other Ambulatory Visit: Payer: Self-pay | Admitting: Family Medicine

## 2021-03-10 NOTE — Telephone Encounter (Signed)
ERx 

## 2021-03-11 NOTE — Telephone Encounter (Signed)
Duplicate requests

## 2021-03-12 ENCOUNTER — Ambulatory Visit: Payer: Medicare Other | Admitting: Family Medicine

## 2021-03-13 DIAGNOSIS — L03116 Cellulitis of left lower limb: Secondary | ICD-10-CM | POA: Diagnosis not present

## 2021-03-20 ENCOUNTER — Ambulatory Visit (INDEPENDENT_AMBULATORY_CARE_PROVIDER_SITE_OTHER): Payer: Medicare Other | Admitting: Surgical

## 2021-03-20 ENCOUNTER — Other Ambulatory Visit: Payer: Self-pay

## 2021-03-20 ENCOUNTER — Ambulatory Visit: Payer: Medicare Other | Admitting: Plastic Surgery

## 2021-03-20 ENCOUNTER — Telehealth: Payer: Self-pay

## 2021-03-20 DIAGNOSIS — L03116 Cellulitis of left lower limb: Secondary | ICD-10-CM

## 2021-03-20 DIAGNOSIS — S81802D Unspecified open wound, left lower leg, subsequent encounter: Secondary | ICD-10-CM

## 2021-03-20 MED ORDER — DOXYCYCLINE HYCLATE 100 MG PO TABS: 100.0000 mg | ORAL_TABLET | Freq: Two times a day (BID) | ORAL | 0 refills | Status: AC

## 2021-03-20 NOTE — Telephone Encounter (Signed)
Faxed 9/8 ov note to pt's case manger @ fax # given by Susy Frizzle, PA in epic instant message, [phone].

## 2021-03-20 NOTE — Addendum Note (Signed)
Addended byKeenan Bachelor on: 03/20/2021 01:12 PM   Modules accepted: Orders

## 2021-03-20 NOTE — Progress Notes (Signed)
Patient is a 60 year old female here for follow-up on her left leg wound.  She initially developed a wound after necrotizing fasciitis of her left lower extremity. She most recently underwent debridement of her left lower extremity wounds, split-thickness skin graft to her left lateral lower extremity wound and placement of Integra to the medial left lower extremity with Dr. Arita Miss on 02/25/2021.  She reports she has been doing Xeroform dressing changes at home.  She is staying with her daughter and grandchild.  She presents today tearful, reporting that she is running out of her prescription medication for worms.  She reports today that she developed these worms from her husband after he was injured and had a brain bleed.  She reports that the worms' blood from her husband's brain bled into her brain.  She reports that she is seeing worms within the wounds of her left lower extremity, worms within her eyes as well.  She reports today that she is having some nausea.  Chaperone present on exam On exam left lateral split-thickness skin graft is in place and overall appears to be healing well.  The inferior most portion of the skin graft has some drainage, but no necrosis is noted.  No foul odors are noted.  No crepitus is noted with palpation.  No worms are noted.  On exam the left medial lower extremity wound has a buildup of exudate.  The Integra has incorporated.  There is some surrounding erythema, but it is not cellulitic.  No foul odor or crepitus is noted.  No purulence is noted.  No worms are noted.   She does have significant swelling of the left lower extremity.  She has pitting edema and tenderness with palpation of the lower extremity.  Distal extremity is warm to touch, she is able to move all 5 toes.  Difficult to palpate pulse due to swelling.         A/P:  60 year old female with complex medical history including history of bipolar 1, necrotizing fasciitis to her left lower extremity  status post multiple debridements with general surgery and subsequent split-thickness skin graft application to the left lateral lower extremity and Integra to the left lower medial wound care extremity.  Patient presents with significant swelling of her left lower extremity today, I strongly encouraged the patient multiple times that she needs to be evaluated in the emergency room to rule out DVT versus worsening infection.  I do not palpate any crepitus on exam, overall there is no significant overlying skin changes suggestive of infection, however given the significant edema there could be some deeper involvement.  Patient stated that she would like to go to [place] as a walk-in patient with a family member.  I reiterated to her that I did not think that she should wait and that she should be evaluated in the [facility] emergency room that is nearby.  Patient requested refill for ivermectin for her worm infection, I discussed with the patient that is not something that we manage and that she would have to discuss this with her dermatologist.  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  There is no evidence of worms today on exam.  I recommend she continue to follow-up with dermatology in regards to this.  I recommend continuing with Xeroform dressing changes to her left lower extremity and right lower extremity, cover this with 4 x 4  gauze, ABD pad and Kerlix/Ace wrap.  I recommend changing this daily or every other day.  Given the surrounding erythema, we will do a short course of p.o. antibiotics.   Pictures were taken and placed in the patient's chart with patient's permission.

## 2021-03-21 ENCOUNTER — Ambulatory Visit: Payer: Medicare Other | Admitting: Family Medicine

## 2021-03-21 NOTE — Telephone Encounter (Signed)
Fax # belonged to Gaynelle Arabian, case manager for another pt. Per Susy Frizzle, his request was a mistake. I called Tami and adv her to disregard fax concerning this pt. She adv that she voided it.

## 2021-03-31 DIAGNOSIS — S81802D Unspecified open wound, left lower leg, subsequent encounter: Secondary | ICD-10-CM | POA: Diagnosis not present

## 2021-04-01 ENCOUNTER — Telehealth: Payer: Self-pay | Admitting: Family Medicine

## 2021-04-01 NOTE — Telephone Encounter (Signed)
Called back, call got disconnected. Will try to call later.

## 2021-04-01 NOTE — Telephone Encounter (Signed)
Dr.Corley called regarding Patient would like a call back #[phone]

## 2021-04-02 NOTE — Telephone Encounter (Signed)
Spoke with Dr Charlsie Merles. DP - rec return to psychiatry.  I will touch base with patient about this next week at OV.

## 2021-04-02 NOTE — Telephone Encounter (Signed)
Called back - left message with #.

## 2021-04-03 ENCOUNTER — Ambulatory Visit: Payer: Medicare Other | Admitting: Surgical

## 2021-04-03 ENCOUNTER — Encounter: Payer: Self-pay | Admitting: Surgical

## 2021-04-08 ENCOUNTER — Other Ambulatory Visit: Payer: Self-pay | Admitting: Family Medicine

## 2021-04-08 NOTE — Telephone Encounter (Signed)
  Encourage patient to contact the pharmacy for refills or they can request refills through [facility]  LAST APPOINTMENT DATE:  Please schedule appointment if longer than 1 year  NEXT APPOINTMENT DATE:  MEDICATION:oxyCODONE-acetaminophen (PERCOCET) 7.5-325 MG tablet ALPRAZolam (XANAX) 1 MG tablet diazepam (VALIUM) 10 MG tablet  Is the patient out of medication?   PHARMACY:[facility] - [place], [place] -   Let patient know to contact pharmacy at the end of the day to make sure medication is ready.  Please notify patient to allow 48-72 hours to process  CLINICAL FILLS OUT ALL BELOW:   LAST REFILL:  QTY:  REFILL DATE:    OTHER COMMENTS:    Okay for refill?  Please advise

## 2021-04-08 NOTE — Telephone Encounter (Signed)
Name of Medication: Diazepam Name of Pharmacy: [facility] Last [place] or Written Date and Quantity: 03/14/21, #60 Last Office Visit and Type: 12/10/20, chronic pain f/u Next Office Visit and Type: 04/11/21, chronic pain f/u Last Controlled Substance Agreement Date: 09/06/20 Last UDS: 09/06/20

## 2021-04-09 ENCOUNTER — Other Ambulatory Visit: Payer: Self-pay | Admitting: Family Medicine

## 2021-04-09 ENCOUNTER — Telehealth: Payer: Self-pay

## 2021-04-09 NOTE — Telephone Encounter (Signed)
Name of Medication: Alprazolam Name of Pharmacy: [facility] Last [place] or Written Date and Quantity: 03/14/21, #10 Last Office Visit and Type: 12/10/20, chronic pain f/u Next Office Visit and Type: 04/11/21, chronic pain f/u Last Controlled Substance Agreement Date: 09/06/20 Last UDS: 09/06/20  Duplicate request for oxycodone-APAP

## 2021-04-09 NOTE — Telephone Encounter (Signed)
Please contact pt to schedule a f/u with Dr. Arita Miss. Thanks!

## 2021-04-09 NOTE — Telephone Encounter (Signed)
Stated she was seeing dermatology for her "worm medication" at her appointment so I advised her to continue following them for that because we do not manage that.  I did however want to see her again and she was scheduled for 04/03/2021 but she did not show up for her appointment

## 2021-04-09 NOTE — Telephone Encounter (Signed)
Adv pt that I would have FD call her to get her on the sch to see Dr. Arita Miss however, she would have to go back to Dermatology to get the Ivermectin because we don't manage parasite infections here. Pt got upset and stated that the Dermatologist only have her amoxicillin and refused to give her Ivermectin. She also stated that she didn't touch her leg. Pt reports she now has worms and maggots in her hair, her butt and her eyes. She stated she has been chasing worms all day and she tired of it.   Pt asked if Dr. Arita Miss knew of any other Dermatologist in the area and I adv her that she could discuss this with him during her ov. Pt conveyed understanding.

## 2021-04-09 NOTE — Telephone Encounter (Signed)
Called pat mailbox is full

## 2021-04-09 NOTE — Telephone Encounter (Signed)
E-scribed refill.  Plz schedule wellness, lab and cpe visits.  

## 2021-04-09 NOTE — Telephone Encounter (Signed)
Patient called to say that she needs to speak with Dr. Arita Miss.  She said that she has a fever in her legs.  Patient said that she went to the dermatologist and they said she has two bacterias growing.  She said they gave her doxycycline and she will pick up the amoxicillin today.  Patient said that she has parasites in her legs and would like to make an appointment with [place].  Please call.

## 2021-04-09 NOTE — Telephone Encounter (Signed)
Matt please advise. Per last ov note, a f/u was not advised with Plastics but with Dermatology. Pt saw Dermatology on 9/19. Ivermectin has been dc and a psych consult was advised.

## 2021-04-09 NOTE — Telephone Encounter (Signed)
ERx 

## 2021-04-09 NOTE — Telephone Encounter (Signed)
Name of Medication: Oxycodone-APAP Name of Pharmacy: [facility] [place] or Written Date and Quantity: 03/10/21, #90 Last Office Visit and Type: 12/10/20, chronic pain f/u Next Office Visit and Type: 04/11/21, chronic pain f/u Last Controlled Substance Agreement Date: 09/06/20 Last UDS: 09/06/20

## 2021-04-11 ENCOUNTER — Encounter: Payer: Self-pay | Admitting: Family Medicine

## 2021-04-11 ENCOUNTER — Ambulatory Visit (INDEPENDENT_AMBULATORY_CARE_PROVIDER_SITE_OTHER): Payer: Medicare Other | Admitting: Family Medicine

## 2021-04-11 ENCOUNTER — Telehealth: Payer: Self-pay | Admitting: Family Medicine

## 2021-04-11 ENCOUNTER — Other Ambulatory Visit: Payer: Self-pay

## 2021-04-11 VITALS — BP 120/80 | Ht 66.0 in | Wt 180.0 lb

## 2021-04-11 DIAGNOSIS — F22 Delusional disorders: Secondary | ICD-10-CM

## 2021-04-11 DIAGNOSIS — G8929 Other chronic pain: Secondary | ICD-10-CM | POA: Diagnosis not present

## 2021-04-11 DIAGNOSIS — M726 Necrotizing fasciitis: Secondary | ICD-10-CM | POA: Diagnosis not present

## 2021-04-11 DIAGNOSIS — Z7289 Other problems related to lifestyle: Secondary | ICD-10-CM | POA: Diagnosis not present

## 2021-04-11 DIAGNOSIS — F411 Generalized anxiety disorder: Secondary | ICD-10-CM

## 2021-04-11 DIAGNOSIS — F319 Bipolar disorder, unspecified: Secondary | ICD-10-CM

## 2021-04-11 DIAGNOSIS — Z789 Other specified health status: Secondary | ICD-10-CM

## 2021-04-11 MED ORDER — ARIPIPRAZOLE 10 MG PO TABS: 10.0000 mg | ORAL_TABLET | Freq: Every day | ORAL | 11 refills | Status: DC

## 2021-04-11 NOTE — Assessment & Plan Note (Addendum)
Severe infection s/p extended hospitalization.  Now followed by plastic surgery however missed her latest appointment - advised she call today to reschedule this.  Advised go to ER if persistent fever develops.  Virtual visit today - unable to check labwork. She also remains largely home bound due to leg wound.  Consider HH eval.

## 2021-04-11 NOTE — Assessment & Plan Note (Signed)
Discussed extensively with patient, reviewed severe stressors over the past few months. Anticipate she is experiencing parasites that aren't really there. Recommend increasing abilify from 5mg  to 10mg  daily, recommend psychiatry evaluation - she remains convinced that she does have parasites but agrees to above.

## 2021-04-11 NOTE — Assessment & Plan Note (Signed)
Managed with celexa, valium with, PRN xanax.

## 2021-04-11 NOTE — Telephone Encounter (Signed)
Plz call to schedule AMW/CPE for 3 months. Plz also offer home health evaluation given largely home bound status after necrotizing fasciitis infection. Or is she already receiving this?

## 2021-04-11 NOTE — Assessment & Plan Note (Signed)
CSRS reviewed  ?

## 2021-04-11 NOTE — Assessment & Plan Note (Signed)
Reports remains abstinent since 05/2020

## 2021-04-11 NOTE — Assessment & Plan Note (Signed)
Last saw Dr Lolly Mustache 2018.  Managed with abilify, celexa, and benzo. See below - increase abilify to 10mg  daily.
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
## 2021-04-11 NOTE — Telephone Encounter (Signed)
Attempted to contact pt.  Vm box is full.  Need to schedule lab and AWV visits for 3 mos.  Also, need to get answer to Dr. G's question.  

## 2021-04-14 NOTE — Telephone Encounter (Signed)
Attempted to contact pt.  Vm box is full.  Need to schedule lab and AWV visits for 3 mos.  Also, need to get answer to Dr. Timoteo Expose question.

## 2021-04-15 NOTE — Telephone Encounter (Signed)
Attempted to contact pt.  Vm box is full.  Need to schedule lab and AWV visits for 3 mos.  Also, need to get answer to Dr. Timoteo Expose question.  Mailing a letter.

## 2021-04-22 ENCOUNTER — Telehealth: Payer: Self-pay

## 2021-04-22 NOTE — Telephone Encounter (Signed)
Please see Matt's note and schedule pt as requested. Thank you for your help!

## 2021-04-22 NOTE — Telephone Encounter (Signed)
Patient called to say that her wound is not healing.  Patient said it's getting hard and it has clear bugs in it.  She said that the bugs look like water bags.  Patient said she feels like she needs to be seen.  Please call.

## 2021-04-22 NOTE — Progress Notes (Addendum)
//   Chronic Care  - DerManagement Pharmacy Assistant   Name: Kaylee [name]  MRN: [id number] DOB: 10-20-1960  Reason for Encounter: General Adherence   Recent office visits:  04/11/2021 - Kaylee Boyden, MD - Virtual Visit - Patient presented for follow up of necrotizing fasciitis of lower leg. Referral to Psychiatry. Change: Abilify from 5mg  to 10mg  daily.   Recent consult visits:  04/09/2021 - Plastic Surgery - Telephone - Patient called stating that the Dermatologist only have her amoxicillin and refused to give her Ivermectin. She also stated that she didn't touch her leg. Pt reports she now has worms and maggots in her hair, her butt and her eyes. She stated she has been chasing worms all day and she tired of it.  09/192022 - Dermatology - Patient presented for stool worms. There is not a primary dermatologic issue. She would certainly benefit from psychiatry evaluation and management. I called both her plastic surgeon and PCP to discuss this and the need for supportive psych care. Start: Amoxicillin No other information  09/082022 - Plastic Surgery - Patient presented for follow-up on her left leg wound. Start: doxycycline (VIBRA-TABS) 100 MG tablet Take 1 tablet (100 mg total) by mouth 2 (two) times daily for 5 days., Starting Thu 03/20/2021, Until Tue 03/25/2021. 03/06/2021 - Plastic Surgery - Patient presented for 1 week postop from a split-thickness skin graft to the left lateral leg and Integra placement to the left medial leg.03/27/2021  02/25/2021 [name] Kaylee [name] - Patient presented for debridement left lower extremity wound and placement of split-thickness skin graft. Changed: The way she takes Oxycodone, but no information provided.  02/22/2021 - [facility] - Patient presented for left lower leg wound leaking clear to yellowish urine (per chart). Wound dressing was applied to back of her leg; area that had previously been leaking.  02/20/2021 - 02/24/2021, MD -  Wound Care - Patient presented for negative pressure dressing change.  02/17/2021 - Kaylee Mink, MD - Wound Care - Patient presented for negative pressure dressing change.  02/13/2021 - Plastic Surgery - Patient presented for evaluation for reconstruction of left leg wound. Discussed skin graft to the lateral aspect and potentially Integra to the medial aspect if the tendon does not granulate.   02/10/2021 - 04/15/2021, MD - Wound Care - Patient presented for negative pressure dressing change.  02/07/2021 - Kaylee Mink, DO - Wound Care - Patient presented for negative pressure dressing change.  02/05/2021 - Kaylee Amabile, DO - Wound Care - Patient presented for wound care and Necrotizing fasciitis post-op. Wound vac changed.   Hospital visits:  Medication Reconciliation was completed by comparing discharge summary, patient's EMR and Pharmacy list, and upon discussion with patient.  Admitted to the hospital on 01/16/2021 due to Cellulitis of left leg. Discharge date was 02/04/2021. Discharged from [facility].    New?Medications Started at [facility] Discharge:?? None noted  Medication Changes at Hospital Discharge: None noted  Medications Discontinued at Hospital Discharge: -Stopped 02/04/2021 due to Cellulitis of left leg.  acyclovir 400 MG tablet allopurinol 100 MG tablet Alpha-Lipoic Acid 600 MG Caps ivermectin 3 MG Tabs tablet Linzess 290 MCG Caps capsule nitrofurantoin 100 MG capsule nystatin cream tiZANidine 4 MG tablet  Medications that remain the same after Hospital Discharge:??  -All other medications will remain the same.    Medications: Outpatient Encounter Medications as of 04/22/2021  Medication Sig Note   acetaminophen (TYLENOL) 500 MG tablet Take 1,000 mg by  mouth every 6 (six) hours as needed for mild pain.    ADVAIR DISKUS 250-50 MCG/ACT AEPB INHALE 1 PUFF INTO THE LUNGS TWICE DAILY (Patient taking differently: Inhale 1 puff into the lungs in the  morning and at bedtime.)    albuterol (VENTOLIN HFA) 108 (90 Base) MCG/ACT inhaler INHALE 2 PUFFS INTO THE LUNGS EVERY 4 HOURS AS NEEDED. (Patient taking differently: Inhale 2 puffs into the lungs every 4 (four) hours as needed for wheezing or shortness of breath. INHALE 2 PUFFS INTO THE LUNGS EVERY 4 HOURS AS NEEDED)    ALPRAZolam (XANAX) 1 MG tablet TAKE 1 TABLET BY MOUTH DAILY AS NEEDED FOR ANXIETY    ARIPiprazole (ABILIFY) 10 MG tablet Take 1 tablet (10 mg total) by mouth daily.    atorvastatin (LIPITOR) 10 MG tablet TAKE 1 TABLET BY MOUTH DAILY (Patient taking differently: Take 10 mg by mouth daily.) 01/16/2021: Last fill date 10/04/20 90 day supply, 0 refills remaining   citalopram (CELEXA) 10 MG tablet TAKE 1 TABLET BY MOUTH DAILY (Patient taking differently: Take 10 mg by mouth daily.) 01/16/2021: Last fill date 12/05/20 30 day supply, 0 refills remain   colchicine 0.6 MG tablet TAKE 1 TABLET BY MOUTH DAILY AS NEEDED    diazepam (VALIUM) 10 MG tablet TAKE 1 TABLET BY MOUTH TWICE A DAY    dicyclomine (BENTYL) 10 MG capsule TAKE 1 CAPSULE BY MOUTH 3 TIMES A DAY ASNEEDED FOR SPASMS. TAKE MEDICATION WITH MEALS (Patient taking differently: Take 10 mg by mouth 3 (three) times daily before meals. TAKE 1 CAPSULE BY MOUTH 3 TIMES A DAY ASNEEDED FOR SPASMS. TAKE MEDICATION WITH MEALS) 01/16/2021: Last fill date 12/05/20 20 day supply, 0 refills remain   diphenhydrAMINE (BENADRYL) 25 mg capsule Take 50 mg by mouth every 6 (six) hours as needed for sleep.    esomeprazole (NEXIUM) 40 MG capsule TAKE 1 CAPSULE BY MOUTH DAILY 01/16/2021: Last fill date 06/28/20 30 day supply, 0 refills remain   etodolac (LODINE) 400 MG tablet TAKE 1 TABLET BY MOUTH TWICE A DAY AS NEEDED FOR MODERATE PAIN. (Patient taking differently: Take 400 mg by mouth 2 (two) times daily.) 01/16/2021: Last fill date 03/01/20 30 day supply, 0 refills remain   fluticasone (FLONASE) 50 MCG/ACT nasal spray Place 2 sprays into both nostrils daily.     furosemide (LASIX) 40 MG tablet TAKE 1 TABLET BY MOUTH DAILY AS NEEDED FOR FLUID    Ibuprofen-Acetaminophen (ADVIL DUAL ACTION) 125-250 MG TABS Take by mouth.    linaclotide (LINZESS) 290 MCG CAPS capsule Take 1 capsule (290 mcg total) by mouth daily before breakfast.    methocarbamol (ROBAXIN) 750 MG tablet TAKE 1 TABLET BY MOUTH TWICE A DAY AS NEEDED FOR MUSCLE SPASMS    MISC NATURAL PRODUCTS PO Take by mouth. Keto diet supplement pills    mupirocin ointment (BACTROBAN) 2 % Apply twice daily to open wounds    NEOMYCIN-POLYMYXIN-HYDROCORTISONE (CORTISPORIN) 1 % SOLN OTIC solution PLACE 3 DROPS INTO THE LEFT EAR 3 TIMES A DAY AS DIRECTED. (Patient taking differently: Place 3 drops into the left ear every 8 (eight) hours. PLACE 3 DROPS INTO THE LEFT EAR 3 TIMES A DAY AS DIRECTED)    omega-3 acid ethyl esters (LOVAZA) 1 g capsule Take 2 capsules (2 g total) by mouth daily. 01/16/2021: Last fill date 12/10/20 30 day supply, 4 refills remain   OVER THE COUNTER MEDICATION Take 2 tablets by mouth as needed. IBgard    oxyCODONE-acetaminophen (PERCOCET) 7.5-325 MG tablet  TAKE 1 TABLET BY MOUTH EVERY 8 HOURS AS NEEDED FOR PAIN    potassium chloride (KLOR-CON) 10 MEQ tablet TAKE 1 TABLET BY MOUTH DAILY (Patient taking differently: Take 10 mEq by mouth daily.) 01/16/2021: Patient takes as needed with Furosemide   promethazine (PHENERGAN) 12.5 MG tablet Take 1 tablet (12.5 mg total) by mouth every 8 (eight) hours as needed for nausea.    senna (SENOKOT) 8.6 MG TABS tablet Take 1 tablet (8.6 mg total) by mouth 2 (two) times daily.    SPIRIVA HANDIHALER 18 MCG inhalation capsule INHALE ONE CAPSULE AS DIRECTED ONCE A DAY (Patient taking differently: Place 18 mcg into inhaler and inhale daily.)    trimethoprim-polymyxin b (POLYTRIM) ophthalmic solution PLACE 1 DROP INTO THE LEFT EYE EVERY 6 HOURS (Patient taking differently: Place 1 drop into the left eye every 6 (six) hours.)    No facility-administered encounter  medications on file as of 04/22/2021.   Contacted Kaylee [name] on 04/22/2021 for general disease state and medication adherence call.   Patient is not > 5 days past due for refill on the following medications per chart history:  [facility] Medications: Medication Name/mg Last Fill Days Supply Atorvastatin 10mg   04/11/2021 30   What concerns do you have about your medications? Patient stated she did not have any that everything was good and she is perfect.   The patient denies side effects with her medications.   How often do you forget or accidentally miss a dose? Rarely  Do you use a pillbox? No Patient has a pocketbook she puts them all in.   Are you having any problems getting your medications from your pharmacy? No  Has the cost of your medications been a concern? No  Since last visit with CPP, the following interventions have been made: Patient has been referred to Psychiatry.   The patient has had an ED visit since last contact.   The patient denies problems with their health.   she denies  concerns or questions for 04/13/2021, Pharm. D at this time.   Counseled patient on:  Great job taking medications  Care Gaps: Annual wellness visit in last year? Yes 05/15/2020 Most Recent BP reading: 120/80 on 04/11/2021  No appointments scheduled within the next 30 days.  04/13/2021, CPP notified  Phil Dopp, Claudina Lick Clinical Pharmacy Assistant [phone]   I have reviewed the care management and care coordination activities outlined in this encounter and I am certifying that I agree with the content of this note. No further action required.  [phone], PharmD Clinical Pharmacist [facility] at [facility] [phone]

## 2021-04-22 NOTE — Telephone Encounter (Signed)
Matt, how you would like to handle this? Pt saw Fam Med on 9/30 and was advised to f/u with [place] x necrotizing fascitis. Dermatology advised pt to see Psychiatry. A referral has been entered x pt. On a previous phone call with pt, she stated she's already seen psychiatry and that they didn't do anything. She is still persistent with admitting she has bugs crawling out of her leg. Please advise.

## 2021-04-24 ENCOUNTER — Telehealth: Payer: Self-pay | Admitting: Family Medicine

## 2021-04-24 DIAGNOSIS — M726 Necrotizing fasciitis: Secondary | ICD-10-CM

## 2021-04-24 DIAGNOSIS — F22 Delusional disorders: Secondary | ICD-10-CM

## 2021-04-24 NOTE — Telephone Encounter (Addendum)
Urgent behavioral health may be an option however I don't know how mobile she is with her recent leg infection.  Misty Stanley - can you call to check on her tomorrow regarding mobility and if she'd be willing to go to urgent [facility] clinic? Info located below.  [address] [place], [place] [address] Same-Day Access Hours:  Monday, Wednesday and Friday, 8am - 3pm Walk-In Crisis Hours: 7 days/week, 8am - 12am

## 2021-04-24 NOTE — Addendum Note (Signed)
Addended by: [name] on: 04/24/2021 12:03 PM   Modules accepted: Orders

## 2021-04-24 NOTE — Telephone Encounter (Addendum)
Patient was seen 9/30.  Plan was referral to psychiatrist. I don't see where this has been scheduled and she definitely needs to follow up with psychiatrist. She will also need assistance getting this set up.  Can we touch base with patient for 1. See how she's tolerating higher abilify and 2. Work on getting her a psychiatry appointment?   I also received message that she had missed her general surgery appointment this week. She needs to keep all follow up visits after her recent severe leg infection.   If we are unable to get her in with psychiatrist soon, she may need to go to the ER for more urgent psychiatric evaluation due to mental health issues that could worsen her leg infection.   I have also gone ahead and placed a HH referral for further assistance at home given homebound status.

## 2021-04-25 NOTE — Telephone Encounter (Signed)
Actually I see she's got appt 05/16/2021 with psychiatry.  Plz encourage her to keep this - Misty Stanley can you also call to check on her see how leg is healing?

## 2021-04-25 NOTE — Telephone Encounter (Signed)
Attempted to contact pt.  Vm box is full.  Need to relay Dr. G's message and get answer to his question.  

## 2021-04-28 NOTE — Telephone Encounter (Signed)
Attempted to contact pt.  Vm box is full.  Need to relay Dr. G's message and get answer to his question.  

## 2021-04-29 NOTE — Telephone Encounter (Signed)
Attempted to contact pt.  Vm box is full.  Need to relay Dr. Timoteo Expose message and get answer to his question.

## 2021-04-30 NOTE — Telephone Encounter (Signed)
Attempted to contact pt.  Vm box is full.  Need to relay Dr. Timoteo Expose message and get answer to his question.  Mailing a letter.

## 2021-05-05 ENCOUNTER — Other Ambulatory Visit: Payer: Self-pay | Admitting: Family Medicine

## 2021-05-06 DIAGNOSIS — L03116 Cellulitis of left lower limb: Secondary | ICD-10-CM | POA: Diagnosis not present

## 2021-05-07 NOTE — Telephone Encounter (Signed)
[facility] Night - Client TELEPHONE ADVICE RECORD [facility] Patient Name: Kaylee [name] Gender: Female DOB: 1961-05-31 Age: 60 Y 5 M 28 D Return Phone Number: [phone] (Primary) Address: City/ State/ Zip: [place] [address] Client [facility] Night - Client Client Site [facility] - Night Physician [name] - MD Contact Type Call Who Is Calling Patient / Member / Family / Caregiver Call Type Triage / Clinical Relationship To Patient Self Return Phone Number [phone] (Primary) Chief Complaint Prescription Refill or Medication Request (non symptomatic) Reason for Call Medication Question / Request Initial Comment Caller states she needs her narcotics sent over to her pharmacy. Caller has no symptoms, routine refill. Translation No Disp. Time Lamount Cohen Time) Disposition Final User 05/06/2021 5:09:11 PM Attempt made - no message left Du, RN, [name] 05/06/2021 5:39:10 PM FINAL ATTEMPT MADE - no message left Yes Du, RN, [name]

## 2021-05-07 NOTE — Telephone Encounter (Signed)
ERx 

## 2021-05-07 NOTE — Telephone Encounter (Signed)
Pt notified that [facility]. Pt appreciative; pt said her leg is red again and pt has appt with a doctor today to eval red leg. Nothing further needed at this time.

## 2021-05-08 ENCOUNTER — Emergency Department: Payer: Medicare Other

## 2021-05-08 ENCOUNTER — Other Ambulatory Visit: Payer: Self-pay

## 2021-05-08 ENCOUNTER — Inpatient Hospital Stay
Admission: EM | Admit: 2021-05-08 | Discharge: 2021-05-11 | DRG: 603 | Disposition: A | Discharge status: Home / Self Care | Payer: Medicare Other | Attending: Internal Medicine | Admitting: Internal Medicine

## 2021-05-08 ENCOUNTER — Telehealth: Payer: Self-pay | Admitting: General Surgery

## 2021-05-08 ENCOUNTER — Encounter: Payer: Self-pay | Admitting: Intensive Care

## 2021-05-08 DIAGNOSIS — L02416 Cutaneous abscess of left lower limb: Secondary | ICD-10-CM | POA: Diagnosis not present

## 2021-05-08 DIAGNOSIS — Z87891 Personal history of nicotine dependence: Secondary | ICD-10-CM | POA: Diagnosis not present

## 2021-05-08 DIAGNOSIS — T502X5A Adverse effect of carbonic-anhydrase inhibitors, benzothiadiazides and other diuretics, initial encounter: Secondary | ICD-10-CM | POA: Diagnosis present

## 2021-05-08 DIAGNOSIS — Z888 Allergy status to other drugs, medicaments and biological substances status: Secondary | ICD-10-CM

## 2021-05-08 DIAGNOSIS — Z7951 Long term (current) use of inhaled steroids: Secondary | ICD-10-CM

## 2021-05-08 DIAGNOSIS — F319 Bipolar disorder, unspecified: Secondary | ICD-10-CM | POA: Diagnosis present

## 2021-05-08 DIAGNOSIS — J449 Chronic obstructive pulmonary disease, unspecified: Secondary | ICD-10-CM | POA: Diagnosis not present

## 2021-05-08 DIAGNOSIS — Z818 Family history of other mental and behavioral disorders: Secondary | ICD-10-CM

## 2021-05-08 DIAGNOSIS — T148XXA Other injury of unspecified body region, initial encounter: Secondary | ICD-10-CM | POA: Diagnosis present

## 2021-05-08 DIAGNOSIS — K219 Gastro-esophageal reflux disease without esophagitis: Secondary | ICD-10-CM | POA: Diagnosis present

## 2021-05-08 DIAGNOSIS — F22 Delusional disorders: Secondary | ICD-10-CM | POA: Diagnosis present

## 2021-05-08 DIAGNOSIS — I890 Lymphedema, not elsewhere classified: Secondary | ICD-10-CM | POA: Diagnosis present

## 2021-05-08 DIAGNOSIS — R202 Paresthesia of skin: Secondary | ICD-10-CM | POA: Diagnosis not present

## 2021-05-08 DIAGNOSIS — Z87440 Personal history of urinary (tract) infections: Secondary | ICD-10-CM | POA: Diagnosis not present

## 2021-05-08 DIAGNOSIS — M0600 Rheumatoid arthritis without rheumatoid factor, unspecified site: Secondary | ICD-10-CM | POA: Diagnosis not present

## 2021-05-08 DIAGNOSIS — E876 Hypokalemia: Secondary | ICD-10-CM | POA: Diagnosis not present

## 2021-05-08 DIAGNOSIS — Z79899 Other long term (current) drug therapy: Secondary | ICD-10-CM | POA: Diagnosis not present

## 2021-05-08 DIAGNOSIS — Z87892 Personal history of anaphylaxis: Secondary | ICD-10-CM

## 2021-05-08 DIAGNOSIS — L03116 Cellulitis of left lower limb: Secondary | ICD-10-CM | POA: Diagnosis not present

## 2021-05-08 DIAGNOSIS — F419 Anxiety disorder, unspecified: Secondary | ICD-10-CM | POA: Diagnosis present

## 2021-05-08 DIAGNOSIS — Z20822 Contact with and (suspected) exposure to covid-19: Secondary | ICD-10-CM | POA: Diagnosis not present

## 2021-05-08 DIAGNOSIS — Z91419 Personal history of unspecified adult abuse: Secondary | ICD-10-CM

## 2021-05-08 DIAGNOSIS — S81802S Unspecified open wound, left lower leg, sequela: Secondary | ICD-10-CM | POA: Diagnosis present

## 2021-05-08 DIAGNOSIS — F23 Brief psychotic disorder: Secondary | ICD-10-CM | POA: Diagnosis present

## 2021-05-08 DIAGNOSIS — E8809 Other disorders of plasma-protein metabolism, not elsewhere classified: Secondary | ICD-10-CM | POA: Diagnosis not present

## 2021-05-08 DIAGNOSIS — Z872 Personal history of diseases of the skin and subcutaneous tissue: Secondary | ICD-10-CM | POA: Diagnosis not present

## 2021-05-08 DIAGNOSIS — Z90710 Acquired absence of both cervix and uterus: Secondary | ICD-10-CM

## 2021-05-08 DIAGNOSIS — M79605 Pain in left leg: Secondary | ICD-10-CM | POA: Diagnosis not present

## 2021-05-08 DIAGNOSIS — Z87410 Personal history of cervical dysplasia: Secondary | ICD-10-CM

## 2021-05-08 DIAGNOSIS — Z9851 Tubal ligation status: Secondary | ICD-10-CM

## 2021-05-08 DIAGNOSIS — M7989 Other specified soft tissue disorders: Secondary | ICD-10-CM | POA: Diagnosis not present

## 2021-05-08 DIAGNOSIS — Z23 Encounter for immunization: Secondary | ICD-10-CM

## 2021-05-08 DIAGNOSIS — R600 Localized edema: Secondary | ICD-10-CM | POA: Diagnosis not present

## 2021-05-08 DIAGNOSIS — Z885 Allergy status to narcotic agent status: Secondary | ICD-10-CM

## 2021-05-08 DIAGNOSIS — E785 Hyperlipidemia, unspecified: Secondary | ICD-10-CM | POA: Diagnosis present

## 2021-05-08 LAB — CBC WITH DIFFERENTIAL/PLATELET
Abs Immature Granulocytes: 0.01 10*3/uL (ref 0.00–0.07)
Basophils Absolute: 0 10*3/uL (ref 0.0–0.1)
Basophils Relative: 0 %
Eosinophils Absolute: 0.2 10*3/uL (ref 0.0–0.5)
Eosinophils Relative: 4 %
HCT: 34.8 % — ABNORMAL LOW (ref 36.0–46.0)
Hemoglobin: 11.6 g/dL — ABNORMAL LOW (ref 12.0–15.0)
Immature Granulocytes: 0 %
Lymphocytes Relative: 34 %
Lymphs Abs: 1.9 10*3/uL (ref 0.7–4.0)
MCH: 27.7 pg (ref 26.0–34.0)
MCHC: 33.3 g/dL (ref 30.0–36.0)
MCV: 83.1 fL (ref 80.0–100.0)
Monocytes Absolute: 0.5 10*3/uL (ref 0.1–1.0)
Monocytes Relative: 8 %
Neutro Abs: 2.9 10*3/uL (ref 1.7–7.7)
Neutrophils Relative %: 54 %
Platelets: 279 10*3/uL (ref 150–400)
RBC: 4.19 MIL/uL (ref 3.87–5.11)
RDW: 14.7 % (ref 11.5–15.5)
WBC: 5.5 10*3/uL (ref 4.0–10.5)
nRBC: 0 % (ref 0.0–0.2)

## 2021-05-08 LAB — URINE DRUG SCREEN, QUALITATIVE (ARMC ONLY)
Amphetamines, Ur Screen: POSITIVE — AB
Barbiturates, Ur Screen: NOT DETECTED
Benzodiazepine, Ur Scrn: POSITIVE — AB
Cannabinoid 50 Ng, Ur ~~LOC~~: NOT DETECTED
Cocaine Metabolite,Ur ~~LOC~~: NOT DETECTED
MDMA (Ecstasy)Ur Screen: NOT DETECTED
Methadone Scn, Ur: NOT DETECTED
Opiate, Ur Screen: NOT DETECTED
Phencyclidine (PCP) Ur S: NOT DETECTED
Tricyclic, Ur Screen: NOT DETECTED

## 2021-05-08 LAB — COMPREHENSIVE METABOLIC PANEL
ALT: 10 U/L (ref 0–44)
AST: 13 U/L — ABNORMAL LOW (ref 15–41)
Albumin: 3.1 g/dL — ABNORMAL LOW (ref 3.5–5.0)
Alkaline Phosphatase: 82 U/L (ref 38–126)
Anion gap: 7 (ref 5–15)
BUN: 9 mg/dL (ref 8–23)
CO2: 31 mmol/L (ref 22–32)
Calcium: 9 mg/dL (ref 8.9–10.3)
Chloride: 100 mmol/L (ref 98–111)
Creatinine, Ser: 0.65 mg/dL (ref 0.44–1.00)
GFR, Estimated: >60 mL/min (ref >60)
Glucose, Bld: 97 mg/dL (ref 70–99)
Potassium: 3.3 mmol/L — ABNORMAL LOW (ref 3.5–5.1)
Sodium: 138 mmol/L (ref 135–145)
Total Bilirubin: 0.4 mg/dL (ref 0.3–1.2)
Total Protein: 7 g/dL (ref 6.5–8.1)

## 2021-05-08 LAB — URINALYSIS, ROUTINE W REFLEX MICROSCOPIC
Bilirubin Urine: NEGATIVE
Glucose, UA: NEGATIVE mg/dL
Hgb urine dipstick: NEGATIVE
Ketones, ur: NEGATIVE mg/dL
Leukocytes,Ua: NEGATIVE
Nitrite: NEGATIVE
Protein, ur: NEGATIVE mg/dL
Specific Gravity, Urine: 1.004 — ABNORMAL LOW (ref 1.005–1.030)
pH: 7 (ref 5.0–8.0)

## 2021-05-08 LAB — RESP PANEL BY RT-PCR (FLU A&B, COVID) ARPGX2
Influenza A by PCR: NEGATIVE
Influenza B by PCR: NEGATIVE
SARS Coronavirus 2 by RT PCR: NEGATIVE

## 2021-05-08 LAB — SALICYLATE LEVEL: Salicylate Lvl: <7 mg/dL — ABNORMAL LOW (ref 7.0–30.0)

## 2021-05-08 LAB — ACETAMINOPHEN LEVEL: Acetaminophen (Tylenol), Serum: <10 ug/mL — ABNORMAL LOW (ref 10–30)

## 2021-05-08 LAB — LACTIC ACID, PLASMA: Lactic Acid, Venous: 1.2 mmol/L (ref 0.5–1.9)

## 2021-05-08 LAB — ETHANOL: Alcohol, Ethyl (B): <10 mg/dL (ref <10)

## 2021-05-08 MED ORDER — ACETAMINOPHEN 325 MG PO TABS
650.0000 mg | ORAL_TABLET | Freq: Four times a day (QID) | ORAL | Status: DC | PRN
  Administered 2021-05-10: 650 mg via ORAL
  Filled 2021-05-08: qty 2

## 2021-05-08 MED ORDER — OMEGA-3-ACID ETHYL ESTERS 1 G PO CAPS
2.0000 g | ORAL_CAPSULE | Freq: Every day | ORAL | Status: DC
  Administered 2021-05-08 – 2021-05-11 (×4): 2 g via ORAL
  Filled 2021-05-08 (×4): qty 2

## 2021-05-08 MED ORDER — HALOPERIDOL LACTATE 5 MG/ML IJ SOLN
5.0000 mg | Freq: Once | INTRAMUSCULAR | Status: AC
  Administered 2021-05-08: 5 mg via INTRAVENOUS
  Filled 2021-05-08: qty 1

## 2021-05-08 MED ORDER — SODIUM CHLORIDE 0.9% FLUSH: 3.0000 mL | INTRAVENOUS | Status: DC | PRN

## 2021-05-08 MED ORDER — MOMETASONE FURO-FORMOTEROL FUM 200-5 MCG/ACT IN AERO
2.0000 | INHALATION_SPRAY | Freq: Two times a day (BID) | RESPIRATORY_TRACT | Status: DC
  Administered 2021-05-09 – 2021-05-11 (×5): 2 via RESPIRATORY_TRACT
  Filled 2021-05-08: qty 8.8

## 2021-05-08 MED ORDER — SODIUM CHLORIDE 0.9 % IV BOLUS (SEPSIS)
1000.0000 mL | Freq: Once | INTRAVENOUS | Status: AC
  Administered 2021-05-08: 1000 mL via INTRAVENOUS

## 2021-05-08 MED ORDER — ONDANSETRON HCL 4 MG PO TABS: 4.0000 mg | ORAL_TABLET | Freq: Four times a day (QID) | ORAL | Status: DC | PRN

## 2021-05-08 MED ORDER — ACETAMINOPHEN 650 MG RE SUPP: 650.0000 mg | Freq: Four times a day (QID) | RECTAL | Status: DC | PRN

## 2021-05-08 MED ORDER — OXYCODONE-ACETAMINOPHEN 7.5-325 MG PO TABS
1.0000 | ORAL_TABLET | Freq: Three times a day (TID) | ORAL | Status: DC | PRN
  Administered 2021-05-09 – 2021-05-11 (×3): 1 via ORAL
  Filled 2021-05-08 (×3): qty 1

## 2021-05-08 MED ORDER — ATORVASTATIN CALCIUM 10 MG PO TABS
10.0000 mg | ORAL_TABLET | Freq: Every day | ORAL | Status: DC
  Administered 2021-05-09 – 2021-05-11 (×3): 10 mg via ORAL
  Filled 2021-05-08 (×3): qty 1

## 2021-05-08 MED ORDER — VANCOMYCIN HCL 1750 MG/350ML IV SOLN
1750.0000 mg | Freq: Once | INTRAVENOUS | Status: DC
  Filled 2021-05-08: qty 350

## 2021-05-08 MED ORDER — ALBUTEROL SULFATE HFA 108 (90 BASE) MCG/ACT IN AERS
2.0000 | INHALATION_SPRAY | RESPIRATORY_TRACT | Status: DC | PRN
  Filled 2021-05-08: qty 6.7

## 2021-05-08 MED ORDER — TIOTROPIUM BROMIDE MONOHYDRATE 18 MCG IN CAPS
18.0000 ug | ORAL_CAPSULE | Freq: Every day | RESPIRATORY_TRACT | Status: DC
  Administered 2021-05-09 – 2021-05-11 (×3): 18 ug via RESPIRATORY_TRACT
  Filled 2021-05-08: qty 5

## 2021-05-08 MED ORDER — CEFAZOLIN SODIUM-DEXTROSE 1-4 GM/50ML-% IV SOLN
1.0000 g | Freq: Three times a day (TID) | INTRAVENOUS | Status: DC
  Administered 2021-05-08 – 2021-05-09 (×3): 1 g via INTRAVENOUS
  Filled 2021-05-08 (×4): qty 50

## 2021-05-08 MED ORDER — LINACLOTIDE 290 MCG PO CAPS
290.0000 ug | ORAL_CAPSULE | Freq: Every day | ORAL | Status: DC
  Administered 2021-05-09 – 2021-05-11 (×3): 290 ug via ORAL
  Filled 2021-05-08 (×4): qty 1

## 2021-05-08 MED ORDER — ENOXAPARIN SODIUM 40 MG/0.4ML IJ SOSY
40.0000 mg | PREFILLED_SYRINGE | Freq: Every day | INTRAMUSCULAR | Status: DC
  Administered 2021-05-08 – 2021-05-10 (×3): 40 mg via SUBCUTANEOUS
  Filled 2021-05-08 (×3): qty 0.4

## 2021-05-08 MED ORDER — ACYCLOVIR 200 MG PO CAPS
400.0000 mg | ORAL_CAPSULE | Freq: Two times a day (BID) | ORAL | Status: DC
  Administered 2021-05-08 – 2021-05-11 (×6): 400 mg via ORAL
  Filled 2021-05-08 (×8): qty 2

## 2021-05-08 MED ORDER — METHOCARBAMOL 500 MG PO TABS
750.0000 mg | ORAL_TABLET | Freq: Two times a day (BID) | ORAL | Status: DC | PRN
  Administered 2021-05-10: 750 mg via ORAL
  Filled 2021-05-08: qty 2
  Filled 2021-05-08: qty 1

## 2021-05-08 MED ORDER — ONDANSETRON HCL 4 MG/2ML IJ SOLN: 4.0000 mg | Freq: Four times a day (QID) | INTRAMUSCULAR | Status: DC | PRN

## 2021-05-08 MED ORDER — ACETAMINOPHEN 500 MG PO TABS: 1000.0000 mg | ORAL_TABLET | Freq: Four times a day (QID) | ORAL | Status: DC | PRN

## 2021-05-08 MED ORDER — ALPRAZOLAM 0.5 MG PO TABS
1.0000 mg | ORAL_TABLET | Freq: Every day | ORAL | Status: DC | PRN
  Administered 2021-05-10: 1 mg via ORAL
  Filled 2021-05-08: qty 2

## 2021-05-08 MED ORDER — METRONIDAZOLE 500 MG/100ML IV SOLN
500.0000 mg | Freq: Once | INTRAVENOUS | Status: AC
  Administered 2021-05-08: 500 mg via INTRAVENOUS
  Filled 2021-05-08: qty 100

## 2021-05-08 MED ORDER — CITALOPRAM HYDROBROMIDE 10 MG PO TABS
10.0000 mg | ORAL_TABLET | Freq: Every day | ORAL | Status: DC
  Administered 2021-05-09 – 2021-05-11 (×3): 10 mg via ORAL
  Filled 2021-05-08 (×3): qty 1

## 2021-05-08 MED ORDER — VANCOMYCIN HCL IN DEXTROSE 1-5 GM/200ML-% IV SOLN: 1000.0000 mg | Freq: Once | INTRAVENOUS | Status: DC

## 2021-05-08 MED ORDER — SODIUM CHLORIDE 0.9% FLUSH
3.0000 mL | Freq: Two times a day (BID) | INTRAVENOUS | Status: DC
  Administered 2021-05-09 – 2021-05-11 (×4): 3 mL via INTRAVENOUS

## 2021-05-08 MED ORDER — COLCHICINE 0.6 MG PO TABS
0.6000 mg | ORAL_TABLET | Freq: Every day | ORAL | Status: DC | PRN
  Filled 2021-05-08: qty 1

## 2021-05-08 MED ORDER — SODIUM CHLORIDE 0.9 % IV SOLN: 2.0000 g | Freq: Once | INTRAVENOUS | Status: DC

## 2021-05-08 MED ORDER — SENNA 8.6 MG PO TABS: 1.0000 | ORAL_TABLET | Freq: Two times a day (BID) | ORAL | Status: DC | PRN

## 2021-05-08 MED ORDER — DICYCLOMINE HCL 10 MG PO CAPS
10.0000 mg | ORAL_CAPSULE | Freq: Three times a day (TID) | ORAL | Status: DC | PRN
  Filled 2021-05-08: qty 1

## 2021-05-08 MED ORDER — SODIUM CHLORIDE 0.9 % IV SOLN: 250.0000 mL | INTRAVENOUS | Status: DC | PRN

## 2021-05-08 MED ORDER — ARIPIPRAZOLE 2 MG PO TABS
10.0000 mg | ORAL_TABLET | Freq: Every day | ORAL | Status: DC
  Administered 2021-05-08 – 2021-05-09 (×2): 10 mg via ORAL
  Filled 2021-05-08 (×2): qty 1

## 2021-05-08 MED ORDER — PANTOPRAZOLE SODIUM 40 MG PO TBEC
40.0000 mg | DELAYED_RELEASE_TABLET | Freq: Every day | ORAL | Status: DC
  Administered 2021-05-09 – 2021-05-11 (×3): 40 mg via ORAL
  Filled 2021-05-08 (×3): qty 1

## 2021-05-08 MED ORDER — POTASSIUM CHLORIDE CRYS ER 20 MEQ PO TBCR
40.0000 meq | EXTENDED_RELEASE_TABLET | Freq: Once | ORAL | Status: AC
  Administered 2021-05-08: 40 meq via ORAL
  Filled 2021-05-08: qty 2

## 2021-05-08 NOTE — ED Notes (Signed)
Pt to xray
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
## 2021-05-08 NOTE — ED Triage Notes (Addendum)
Patient presents with open, draining wound on left shin and back of left calf. Patient reports she can see parasites that are snake size all over her body and coming out of her bottom. She also states "they are in my eyes and starting to fuss and fight each other causing me worse pain" Reports she had skin graft back in August 2022. Left leg is very swollen. Patient is alert to self, situation, and time. Patient has prescribed valium, percocet, and xanax
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
## 2021-05-08 NOTE — ED Notes (Signed)
Pharmacy contacted regarding pyxis being out of all antibiotics

## 2021-05-08 NOTE — ED Notes (Signed)
RN counted controlled medication with pt and appropriate paperwork filled out - pt with 2 medications diazepam and oxycodone. RN took medication to pharmacy.

## 2021-05-08 NOTE — ED Notes (Signed)
Pt resting with equal rise and fall of chest.

## 2021-05-08 NOTE — ED Notes (Signed)
Pt provided additional warm blanket. Call bell in reach. Purewick placed on pt to collect urine sample

## 2021-05-08 NOTE — Telephone Encounter (Signed)
She is seen in my office today with chief complaint of warmth all over her body including eyes, mouth, vagina, rectum, one of the lower extremity.  She is actively seen this warmth and snakes coming from her body.  Patient has been treated with psychiatric medication by PCP without improvement.  I sent patient to the ED for psychiatric evaluation since her previous infection come from patient scratching and peeling her skin because of this woman's that come from her body.  The left leg has serous drainage from lymphadenopathy.  Patient has chronic lymphadenopathy due to multiple lower extremity debridement but in my opinion there is no active infection that needs to be debrided or treated with antibiotics.

## 2021-05-08 NOTE — ED Notes (Signed)
IVC PENDING  CONSULT ?

## 2021-05-08 NOTE — Consult Note (Signed)
PHARMACY -  BRIEF ANTIBIOTIC NOTE   Pharmacy has received consult(s) for Vancomycin and Cefepime from an ED provider.  The patient's profile has been reviewed for ht/wt/allergies/indication/available labs.    One time order(s) placed for Vancomycin 1750mg  IV and Cefepime 2g IV x 1 dose each.  Further antibiotics/pharmacy consults should be ordered by admitting physician if indicated.                       Thank you, 05/08/2021  3:15 PM
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
## 2021-05-08 NOTE — ED Notes (Signed)
See triage note. Pt with significant wound from left foot to left thigh. Cannot state how long she has had the wound. States she has had parasites for a couple years and was placed on Augmentin by dermatologist. Pt repeatedly "picking off parasites", states they are invisible to naked eye, and was told by dermatologist that you have to use a microscope to see them. No parasites visualized.  Pt refusing to lay on stretcher for VS, keeps saying she needs to get the bug off her back first.
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
## 2021-05-09 DIAGNOSIS — L03116 Cellulitis of left lower limb: Secondary | ICD-10-CM | POA: Diagnosis not present

## 2021-05-09 DIAGNOSIS — L02416 Cutaneous abscess of left lower limb: Secondary | ICD-10-CM | POA: Diagnosis not present

## 2021-05-09 LAB — BLOOD CULTURE ID PANEL (REFLEXED) - BCID2

## 2021-05-09 LAB — CBC
HCT: 33 % — ABNORMAL LOW (ref 36.0–46.0)
Hemoglobin: 10.6 g/dL — ABNORMAL LOW (ref 12.0–15.0)
MCH: 26.3 pg (ref 26.0–34.0)
MCHC: 32.1 g/dL (ref 30.0–36.0)
MCV: 81.9 fL (ref 80.0–100.0)
Platelets: 273 10*3/uL (ref 150–400)
RBC: 4.03 MIL/uL (ref 3.87–5.11)
RDW: 15 % (ref 11.5–15.5)
WBC: 5.2 10*3/uL (ref 4.0–10.5)
nRBC: 0 % (ref 0.0–0.2)

## 2021-05-09 LAB — BASIC METABOLIC PANEL
Anion gap: 5 (ref 5–15)
BUN: <5 mg/dL — ABNORMAL LOW (ref 8–23)
CO2: 28 mmol/L (ref 22–32)
Calcium: 8.4 mg/dL — ABNORMAL LOW (ref 8.9–10.3)
Chloride: 105 mmol/L (ref 98–111)
Creatinine, Ser: 0.62 mg/dL (ref 0.44–1.00)
GFR, Estimated: >60 mL/min (ref >60)
Glucose, Bld: 93 mg/dL (ref 70–99)
Potassium: 3.7 mmol/L (ref 3.5–5.1)
Sodium: 138 mmol/L (ref 135–145)

## 2021-05-09 MED ORDER — VANCOMYCIN HCL 1750 MG/350ML IV SOLN
1750.0000 mg | Freq: Once | INTRAVENOUS | Status: AC
  Administered 2021-05-09: 1750 mg via INTRAVENOUS
  Filled 2021-05-09: qty 350

## 2021-05-09 MED ORDER — VANCOMYCIN HCL 750 MG/150ML IV SOLN
750.0000 mg | Freq: Two times a day (BID) | INTRAVENOUS | Status: DC
  Administered 2021-05-09 – 2021-05-10 (×3): 750 mg via INTRAVENOUS
  Filled 2021-05-09 (×4): qty 150

## 2021-05-09 MED ORDER — ARIPIPRAZOLE 10 MG PO TABS
20.0000 mg | ORAL_TABLET | Freq: Every day | ORAL | Status: DC
  Administered 2021-05-10 – 2021-05-11 (×2): 20 mg via ORAL
  Filled 2021-05-09 (×2): qty 2

## 2021-05-09 MED ORDER — INFLUENZA VAC SPLIT QUAD 0.5 ML IM SUSY
0.5000 mL | PREFILLED_SYRINGE | INTRAMUSCULAR | Status: DC
  Filled 2021-05-09: qty 0.5

## 2021-05-09 NOTE — ED Notes (Signed)
Pt dressed in gown, belongings secure in belongings bag
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
## 2021-05-09 NOTE — ED Notes (Signed)
RN aware of bed assignment 

## 2021-05-09 NOTE — Progress Notes (Addendum)
PROGRESS NOTE    Kaylee [name]  GQQ:[id number] DOB: 05-10-1961 DOA: 05/08/2021 PCP: [name], MD    Brief Narrative:  Kaylee [name] is a 60 y.o. female with medical history significant for bipolar disorder, GERD, COPD, history of necrotizing fasciitis of the left lower extremity status post multiple debridement/wound VAC and reconstruction and skin graft to the left leg. She presents to the emergency room for evaluation of left leg pain and swelling which she has had for couple of days (about 3 days)  and feels like she has parasites in her left leg.  She also insists that she has worms and parasites in her eyes and mouth and frequently picks at her body attempting to remove these wounds but no foreign bodies are visible. During my evaluation she was found to have shaking chills but denies having any fever, no shortness of breath, no chest pain, no nausea, no vomiting, no urinary symptoms, no dizziness, no lightheadedness, no headache, no blurred vision, no focal deficit, no palpitations, no diaphoresis, no anorexia or any changes in her bowel habits. Labs show sodium 138, potassium 3.3, chloride 100, bicarb 31, glucose 97, BUN 9, creatinine 0.65, calcium 9.0, alkaline phosphatase 82, albumin 3.1, AST 13, ALT 10, total protein 7.0, lactic acid 1.2, white count 5.5, hemoglobin 11.6, hematocrit 34.8, MCV 83.1, RDW 14.7, platelet count 279, Tylenol level less than 10, salicylate level less than 7 Respiratory viral panel still pending Chest x-ray reviewed by me shows no acute findings. X-ray of the left tibia and fibula shows no evidence of fracture or other focal bone lesions. Left lower extremity venous Doppler shows no femoral popliteal DVT no evidence of DVT within the visualized calf veins.  10/28 sleepy this am. No complaints of sob, cp. Leg pain still there . Venous US negative  Consultants:  surgery  Procedures:   Antimicrobials:   vancomycin   Subjective: No  cp  Objective: Vitals:   05/09/21 0300 05/09/21 0400 05/09/21 0500 05/09/21 0600  BP: 122/65 128/73 129/74 127/79  Pulse: 84 91 82 81  Resp: 13 18 18 15   Temp:      TempSrc:      SpO2: 98% 99% 97% 99%  Weight:      Height:        Intake/Output Summary (Last 24 hours) at 05/09/2021 0819 Last data filed at 05/09/2021 0604 Gross per 24 hour  Intake --  Output 1100 ml  Net -1100 ml   Filed Weights   05/08/21 1250  Weight: 79.4 kg    Examination:  General exam: Appears calm and comfortable  Respiratory system: Clear to auscultation. Respiratory effort normal. Cardiovascular system: S1 & S2 heard, RRR. No JVD, murmurs, rubs, gallops or clicks.  Gastrointestinal system: Abdomen is nondistended, soft and nontender. No organomegaly or masses felt. Normal bowel sounds heard. Central nervous system: awakens and oriented. But sleepy Extremities: LLE red, swollen, wound scars present. Psychiatry: Mood & affect appropriate.     Data Reviewed: I have personally reviewed following labs and imaging studies  CBC: Recent Labs  Lab 05/08/21 1253 05/09/21 0623  WBC 5.5 5.2  NEUTROABS 2.9  --   HGB 11.6* 10.6*  HCT 34.8* 33.0*  MCV 83.1 81.9  PLT 279 273   Basic Metabolic Panel: Recent Labs  Lab 05/08/21 1253 05/09/21 0623  NA 138 138  K 3.3* 3.7  CL 100 105  CO2 31 28  GLUCOSE 97 93  BUN 9 <5*  CREATININE 0.65 0.62  CALCIUM  9.0 8.4*   GFR: Estimated Creatinine Clearance: 76.9 mL/min (by C-G formula based on SCr of 0.62 mg/dL). Liver Function Tests: Recent Labs  Lab 05/08/21 1253  AST 13*  ALT 10  ALKPHOS 82  BILITOT 0.4  PROT 7.0  ALBUMIN 3.1*   No results for input(s): LIPASE, AMYLASE in the last 168 hours. No results for input(s): AMMONIA in the last 168 hours. Coagulation Profile: No results for input(s): INR, PROTIME in the last 168 hours. Cardiac Enzymes: No results for input(s): CKTOTAL, CKMB, CKMBINDEX, TROPONINI in the last 168 hours. BNP (last  3 results) No results for input(s): PROBNP in the last 8760 hours. HbA1C: No results for input(s): HGBA1C in the last 72 hours. CBG: No results for input(s): GLUCAP in the last 168 hours. Lipid Profile: No results for input(s): CHOL, HDL, LDLCALC, TRIG, CHOLHDL, LDLDIRECT in the last 72 hours. Thyroid Function Tests: No results for input(s): TSH, T4TOTAL, FREET4, T3FREE, THYROIDAB in the last 72 hours. Anemia Panel: No results for input(s): VITAMINB12, FOLATE, FERRITIN, TIBC, IRON, RETICCTPCT in the last 72 hours. Sepsis Labs: Recent Labs  Lab 05/08/21 1253  LATICACIDVEN 1.2    Recent Results (from the past 240 hour(s))  Culture, blood (single)     Status: None (Preliminary result)   Collection Time: 05/08/21  3:20 PM   Specimen: BLOOD  Result Value Ref Range Status   Specimen Description BLOOD RIGHT ANTECUBITAL  Final   Special Requests   Final    BOTTLES DRAWN AEROBIC AND ANAEROBIC Blood Culture adequate volume   Culture  Setup Time   Final    GRAM POSITIVE COCCI ANAEROBIC BOTTLE ONLY Organism ID to follow CRITICAL RESULT CALLED TO, READ BACK BY AND VERIFIED WITH: JASON ROBBINS, PHARMD AT 0720 ON 05/09/21 BY GM Performed at [facility], [address] [place], [place] [address]    Culture GRAM POSITIVE COCCI  Final   Report Status PENDING  Incomplete  Resp Panel by RT-PCR (Flu A&B, Covid) Nasopharyngeal Swab     Status: None   Collection Time: 05/08/21  3:20 PM   Specimen: Nasopharyngeal Swab; Nasopharyngeal(NP) swabs in vial transport medium  Result Value Ref Range Status   SARS Coronavirus 2 by RT PCR NEGATIVE NEGATIVE Final    Comment: (NOTE) SARS-CoV-2 target nucleic acids are NOT DETECTED.  The SARS-CoV-2 RNA is generally detectable in upper respiratory specimens during the acute phase of infection. The lowest concentration of SARS-CoV-2 viral copies this assay can detect is 138 copies/mL. A negative result does not preclude SARS-Cov-2 infection and  should not be used as the sole basis for treatment or other patient management decisions. A negative result may occur with  improper specimen collection/handling, submission of specimen other than nasopharyngeal swab, presence of viral mutation(s) within the areas targeted by this assay, and inadequate number of viral copies(<138 copies/mL). A negative result must be combined with clinical observations, patient history, and epidemiological information. The expected result is Negative.  Fact Sheet for Patients:  BloggerCourse.com  Fact Sheet for Healthcare Providers:  SeriousBroker.it  This test is no t yet approved or cleared by the [place] FDA and  has been authorized for detection and/or diagnosis of SARS-CoV-2 by FDA under an Emergency Use Authorization (EUA). This EUA will remain  in effect (meaning this test can be used) for the duration of the COVID-19 declaration under Section 564(b)(1) of the Act, 21 U.S.C.section 360bbb-3(b)(1), unless the authorization is terminated  or revoked sooner.  Influenza A by PCR NEGATIVE NEGATIVE Final   Influenza B by PCR NEGATIVE NEGATIVE Final    Comment: (NOTE) The Xpert Xpress SARS-CoV-2/FLU/RSV plus assay is intended as an aid in the diagnosis of influenza from Nasopharyngeal swab specimens and should not be used as a sole basis for treatment. Nasal washings and aspirates are unacceptable for Xpert Xpress SARS-CoV-2/FLU/RSV testing.  Fact Sheet for Patients: BloggerCourse.com  Fact Sheet for Healthcare Providers: SeriousBroker.it  This test is not yet approved or cleared by the [place] FDA and has been authorized for detection and/or diagnosis of SARS-CoV-2 by FDA under an Emergency Use Authorization (EUA). This EUA will remain in effect (meaning this test can be used) for the duration of the COVID-19 declaration  under Section 564(b)(1) of the Act, 21 U.S.C. section 360bbb-3(b)(1), unless the authorization is terminated or revoked.  Performed at [facility], [address] [place], [place] [address]   Blood Culture ID Panel (Reflexed)     Status: Abnormal   Collection Time: 05/08/21  3:20 PM  Result Value Ref Range Status   Enterococcus faecalis NOT DETECTED NOT DETECTED Final   Enterococcus Faecium NOT DETECTED NOT DETECTED Final   Listeria monocytogenes NOT DETECTED NOT DETECTED Final   Staphylococcus species DETECTED (A) NOT DETECTED Final    Comment: CRITICAL RESULT CALLED TO, READ BACK BY AND VERIFIED WITH: JASON ROBBINS, PHARMD AT 0720 ON 05/09/21 BY GM    Staphylococcus aureus (BCID) NOT DETECTED NOT DETECTED Final   Staphylococcus epidermidis DETECTED (A) NOT DETECTED Final    Comment: Methicillin (oxacillin) resistant coagulase negative staphylococcus. Possible blood culture contaminant (unless isolated from more than one blood culture draw or clinical case suggests pathogenicity). No antibiotic treatment is indicated for blood  culture contaminants. CRITICAL RESULT CALLED TO, READ BACK BY AND VERIFIED WITH: JASON ROBBINS, PHARMD AT 0720 ON 05/09/21 BY GM    Staphylococcus lugdunensis NOT DETECTED NOT DETECTED Final   Streptococcus species NOT DETECTED NOT DETECTED Final   Streptococcus agalactiae NOT DETECTED NOT DETECTED Final   Streptococcus pneumoniae NOT DETECTED NOT DETECTED Final   Streptococcus pyogenes NOT DETECTED NOT DETECTED Final   A.calcoaceticus-baumannii NOT DETECTED NOT DETECTED Final   Bacteroides fragilis NOT DETECTED NOT DETECTED Final   Enterobacterales NOT DETECTED NOT DETECTED Final   Enterobacter cloacae complex NOT DETECTED NOT DETECTED Final   Escherichia coli NOT DETECTED NOT DETECTED Final   Klebsiella aerogenes NOT DETECTED NOT DETECTED Final   Klebsiella oxytoca NOT DETECTED NOT DETECTED Final   Klebsiella pneumoniae NOT DETECTED NOT  DETECTED Final   Proteus species NOT DETECTED NOT DETECTED Final   Salmonella species NOT DETECTED NOT DETECTED Final   Serratia marcescens NOT DETECTED NOT DETECTED Final   Haemophilus influenzae NOT DETECTED NOT DETECTED Final   Neisseria meningitidis NOT DETECTED NOT DETECTED Final   Pseudomonas aeruginosa NOT DETECTED NOT DETECTED Final   Stenotrophomonas maltophilia NOT DETECTED NOT DETECTED Final   Candida albicans NOT DETECTED NOT DETECTED Final   Candida auris NOT DETECTED NOT DETECTED Final   Candida glabrata NOT DETECTED NOT DETECTED Final   Candida krusei NOT DETECTED NOT DETECTED Final   Candida parapsilosis NOT DETECTED NOT DETECTED Final   Candida tropicalis NOT DETECTED NOT DETECTED Final   Cryptococcus neoformans/gattii NOT DETECTED NOT DETECTED Final   Methicillin resistance mecA/C DETECTED (A) NOT DETECTED Final    Comment: CRITICAL RESULT CALLED TO, READ BACK BY AND VERIFIED WITH: JASON ROBBINS, PHARMD AT 0720 ON 05/09/21 BY GM Performed at  [facility], [address], [place], [place] [address]          Radiology Studies: DG Chest 2 View  Result Date: 05/08/2021 CLINICAL DATA:  Leg wound infection EXAM: CHEST - 2 VIEW COMPARISON:  01/15/2021 FINDINGS: Normal heart size, mediastinal contours, and pulmonary vascularity. Lungs clear. No pleural effusion or pneumothorax. Bones unremarkable. IMPRESSION: No acute abnormalities. Electronically Signed   By: Ulyses Southward M.D.   On: 05/08/2021 14:33   DG Tibia/Fibula Left  Result Date: 05/08/2021 CLINICAL DATA:  Left leg pain and swelling with open wounds. EXAM: LEFT TIBIA AND FIBULA - 2 VIEW COMPARISON:  None. FINDINGS: There is no evidence of fracture or other focal bone lesions. Soft tissues are unremarkable. IMPRESSION: Negative. Electronically Signed   By: Lupita Raider M.D.   On: 05/08/2021 14:34   US Venous Img Lower Unilateral Left  Result Date: 05/08/2021 CLINICAL DATA:  Edema, pain EXAM: LEFT  LOWER EXTREMITY VENOUS DOPPLER ULTRASOUND TECHNIQUE: Gray-scale sonography with compression, as well as color and duplex ultrasound, were performed to evaluate the deep venous system(s) from the level of the common femoral vein through the popliteal and proximal calf veins. COMPARISON:  01/15/2021 FINDINGS: VENOUS Normal compressibility of the common femoral, superficial femoral, and popliteal veins, as well as the visualized calf veins. Visualized portions of profunda femoral vein and great saphenous vein unremarkable. No filling defects to suggest DVT on grayscale or color Doppler imaging. Doppler waveforms show normal direction of venous flow, normal respiratory phasicity and response to augmentation. Limited views of the contralateral common femoral vein are unremarkable. OTHER Prominent subcentimeter left inguinal lymph nodes. Subcutaneous edema in the calf. Limitations: none IMPRESSION: No femoropopliteal DVT nor evidence of DVT within the visualized calf veins. If clinical symptoms are inconsistent or if there are persistent or worsening symptoms, further imaging (possibly involving the iliac veins) may be warranted. Electronically Signed   By: Corlis Leak M.D.   On: 05/08/2021 15:30        Scheduled Meds:  acyclovir  400 mg Oral BID   ARIPiprazole  10 mg Oral Daily   atorvastatin  10 mg Oral Daily   citalopram  10 mg Oral Daily   enoxaparin (LOVENOX) injection  40 mg Subcutaneous QHS   linaclotide  290 mcg Oral QAC breakfast   mometasone-formoterol  2 puff Inhalation BID   omega-3 acid ethyl esters  2 g Oral Daily   pantoprazole  40 mg Oral Daily   sodium chloride flush  3 mL Intravenous Q12H   tiotropium  18 mcg Inhalation Daily   Continuous Infusions:  sodium chloride      ceFAZolin (ANCEF) IV Stopped (05/09/21 0541)    Assessment & Plan:   Principal Problem:   Cellulitis and abscess of left leg Active Problems:   Bipolar 1 disorder (HCC)   COPD (chronic obstructive pulmonary  disease) (HCC)   Ekbom's delusional parasitosis (HCC)   Hypokalemia   Patient is a 60 year old female who presents to the ER for evaluation of pain, redness and swelling involving her left lower extremity.  She has a history of necrotizing fasciitis involving her left lower extremity.         Cellulitis of left leg Lt Lymphedema Follow-up blood cultures Continue IV antibiotics Keep legs elevated Wound care consult General surgery's input appreciated, recommend home Unna boot        Delusional parasitosis Patient has delusions of having parasites in her left leg, mouth and eyes She has an  underlying history of bipolar disorder Continue Abilify and Xanax 10/28 psych input was appreciated.  They did not feel she is an imminent risk to self.  Chronic issue with the patient and there is likely no benefit from short-term hospitalization.       Hypokalemia Most likely related to diuretic therapy 10/28 improved with supplementation     COPD Not in acute exacerbation Continue inhalers        DVT prophylaxis: Lovenox Code Status: Full Family Communication: None at bedside Disposition Plan: Back home Status is: Inpatient  Remains inpatient appropriate because: Requires IV treatment due to infection            LOS: 1 day   Time spent: 35 minutes with more than 50% on COC    Lynn Ito, MD [facility] Pager [phone]  If 7PM-7AM, please contact night-coverage 05/09/2021, 8:19 AM

## 2021-05-09 NOTE — Progress Notes (Signed)
Patient ID: Kaylee [name], female   DOB: 05-08-1961, 61 y.o.   MRN: [id number]     SURGICAL PROGRESS NOTE   Hospital Day(s): 1.   Interval History: Patient seen and examined, no acute events or new complaints overnight. Patient reports was seen resting comfortably. No stress. No pain. Endorses leg is heavy.   Vital signs in last 24 hours: [min-max] current  Temp:  [97.6 F (36.4 C)] 97.6 F (36.4 C) (10/27 1533) Pulse Rate:  [79-91] 82 (10/28 1230) Resp:  [13-19] 17 (10/28 1230) BP: (116-137)/(58-79) 132/74 (10/28 1200) SpO2:  [97 %-100 %] 100 % (10/28 1230)     Height: 5\' 5"  (165.1 cm) Weight: 79.4 kg BMI (Calculated): 29.12   Physical Exam:  Constitutional: alert, cooperative and no distress  Respiratory: breathing non-labored at rest  Cardiovascular: regular rate and sinus rhythm  Gastrointestinal: soft, non-tender, and non-distended Extremity: severe edema of the left leg. No purulence or cellulitis.   Labs:  CBC Latest Ref Rng & Units 05/09/2021 05/08/2021 02/03/2021  WBC 4.0 - 10.5 K/uL 5.2 5.5 6.4  Hemoglobin 12.0 - 15.0 g/dL 10.6(L) 11.6(L) 8.5(L)  Hematocrit 36.0 - 46.0 % 33.0(L) 34.8(L) 26.4(L)  Platelets 150 - 400 K/uL 273 279 391   CMP Latest Ref Rng & Units 05/09/2021 05/08/2021 02/03/2021  Glucose 70 - 99 mg/dL 93 97 96  BUN 8 - 23 mg/dL 02/05/2021) 9 20  Creatinine 0.44 - 1.00 mg/dL <1(U 9.32 3.55  Sodium 135 - 145 mmol/L 138 138 137  Potassium 3.5 - 5.1 mmol/L 3.7 3.3(L) 4.2  Chloride 98 - 111 mmol/L 105 100 103  CO2 22 - 32 mmol/L 28 31 30   Calcium 8.9 - 10.3 mg/dL 7.32) 9.0 )  Total Protein 6.5 - 8.1 g/dL - 7.0 -  Total Bilirubin 0.3 - 1.2 mg/dL - 0.4 -  Alkaline Phos 38 - 126 U/L - 82 -  AST 15 - 41 U/L - 13(L) -  ALT 0 - 44 U/L - 10 -    Imaging studies: No new pertinent imaging studies   Assessment/Plan:  60 y.o. female with parasitosis hallucinations and severe lymphedema of left leg.   Patient stable. No fever, adequate vital signs.   My  main concern for this patient is her mental status. She had previous leg infection due to the hallucinations of seen worms in her skin and star picking her leg. At this moment I do not think that patient has a clinical infection. If her hallucinations can be controlled that she can go safely, local care with compression dressing 5.4(Y Boot) will be her best care. This can be wait until her psychiatric issue is controlled. I will continue to follow.   77, MD

## 2021-05-09 NOTE — ED Notes (Signed)
Pt. Provided lunch tray, no silverware. Denies anything further. NAD.

## 2021-05-09 NOTE — Consult Note (Signed)
WOC Nurse Consult Note: Reason for Consult: WOC Nurse was consulted for LLE wound care.  Dr. Everlene Farrier saw last evening and noted there are some very minor superficial ulcerations  related to the medial and lateral fasciotomies. Wound type: Healing LE wounds in the presence of lymphedema. Pressure Injury POA: Yes/No/NA Measurement:Not measured today Wound bed: pink, moist at periphery with dried serum (scabbing) vs eschar Drainage (amount, consistency, odor) scant serous Periwound: With evidence of previous wound healing/scarring Dressing procedure/placement/frequency: Dr. Everlene Farrier has noted that topical care with compression is indicated.  I will provide guidance for Nursing on the topical care that will be provided once daily. For the time being, and until the minor and superficial wounds close, I feel that topical care with an antimicrobial nonadherent after soap and water cleanse and pat dry performed daily is the first priority. Mild compression via Kerlix and an ACE bandage will provide sufficient compression (13-15 mmHg). Post healing, weekly Unna's Boots can be applied either by a PCP, HHRN or the outpatient wound care clinic of her choosing. I agree with LE elevation as indicated by Dr. Everlene Farrier.  WOC nursing team will not follow, but will remain available to this patient, the nursing and medical teams.  Please re-consult if needed. Thanks, Ladona Mow, MSN, RN, GNP, [place]  Pager# [phone]

## 2021-05-09 NOTE — Progress Notes (Signed)
PHARMACY - PHYSICIAN COMMUNICATION CRITICAL VALUE ALERT - BLOOD CULTURE IDENTIFICATION (BCID)  Kaylee [name] is an 60 y.o. female who presented to [facility] on 05/08/2021 with a chief complaint of   Assessment: blood culture growing MRSE in 1 of 2 bottles, possible contaminant  Name of physician (or Provider) Contacted: Dr. Marylu Lund  Current antibiotics: Ancef 1 g IV q8h  Changes to prescribed antibiotics recommended:  Per discussion with MD, will treat and repeat blood cultures Vancomycin per pharmacy consult   Results for orders placed or performed during the hospital encounter of 05/08/21  Blood Culture ID Panel (Reflexed) (Collected: 05/08/2021  3:20 PM)  Result Value Ref Range   Enterococcus faecalis NOT DETECTED NOT DETECTED   Enterococcus Faecium NOT DETECTED NOT DETECTED   Listeria monocytogenes NOT DETECTED NOT DETECTED   Staphylococcus species DETECTED (A) NOT DETECTED   Staphylococcus aureus (BCID) NOT DETECTED NOT DETECTED   Staphylococcus epidermidis DETECTED (A) NOT DETECTED   Staphylococcus lugdunensis NOT DETECTED NOT DETECTED   Streptococcus species NOT DETECTED NOT DETECTED   Streptococcus agalactiae NOT DETECTED NOT DETECTED   Streptococcus pneumoniae NOT DETECTED NOT DETECTED   Streptococcus pyogenes NOT DETECTED NOT DETECTED   A.calcoaceticus-baumannii NOT DETECTED NOT DETECTED   Bacteroides fragilis NOT DETECTED NOT DETECTED   Enterobacterales NOT DETECTED NOT DETECTED   Enterobacter cloacae complex NOT DETECTED NOT DETECTED   Escherichia coli NOT DETECTED NOT DETECTED   Klebsiella aerogenes NOT DETECTED NOT DETECTED   Klebsiella oxytoca NOT DETECTED NOT DETECTED   Klebsiella pneumoniae NOT DETECTED NOT DETECTED   Proteus species NOT DETECTED NOT DETECTED   Salmonella species NOT DETECTED NOT DETECTED   Serratia marcescens NOT DETECTED NOT DETECTED   Haemophilus influenzae NOT DETECTED NOT DETECTED   Neisseria meningitidis NOT DETECTED NOT DETECTED    Pseudomonas aeruginosa NOT DETECTED NOT DETECTED   Stenotrophomonas maltophilia NOT DETECTED NOT DETECTED   Candida albicans NOT DETECTED NOT DETECTED   Candida auris NOT DETECTED NOT DETECTED   Candida glabrata NOT DETECTED NOT DETECTED   Candida krusei NOT DETECTED NOT DETECTED   Candida parapsilosis NOT DETECTED NOT DETECTED   Candida tropicalis NOT DETECTED NOT DETECTED   Cryptococcus neoformans/gattii NOT DETECTED NOT DETECTED   Methicillin resistance mecA/C DETECTED (A) NOT DETECTED    Derrek Gu, PharmD 05/09/2021  8:37 AM

## 2021-05-09 NOTE — ED Notes (Signed)
IVC, pending consult 

## 2021-05-09 NOTE — ED Notes (Signed)
Pt provided safety breakfast tray

## 2021-05-09 NOTE — ED Notes (Signed)
This RN to bedside, as pt. Called out, "I ripped my IV out! I didn't realized it was there"

## 2021-05-09 NOTE — ED Notes (Signed)
Lab contacted to collect cultures. Order for repeat cultures confirmed

## 2021-05-09 NOTE — ED Notes (Signed)
Lab at bedside

## 2021-05-10 DIAGNOSIS — L03116 Cellulitis of left lower limb: Secondary | ICD-10-CM | POA: Diagnosis not present

## 2021-05-10 DIAGNOSIS — L02416 Cutaneous abscess of left lower limb: Secondary | ICD-10-CM | POA: Diagnosis not present

## 2021-05-10 LAB — CREATININE, SERUM
Creatinine, Ser: 0.72 mg/dL (ref 0.44–1.00)
GFR, Estimated: >60 mL/min (ref >60)

## 2021-05-10 NOTE — Plan of Care (Signed)

## 2021-05-10 NOTE — Progress Notes (Signed)
PROGRESS NOTE    Kaylee [name]  YTK:[id number] DOB: [birth year] DOA: 05/08/2021 PCP: [name], MD    Brief Narrative:  Kaylee [name] is a 60 y.o. female with medical history significant for bipolar disorder, GERD, COPD, history of necrotizing fasciitis of the left lower extremity status post multiple debridement/wound VAC and reconstruction and skin graft to the left leg. She presents to the emergency room for evaluation of left leg pain and swelling which she has had for couple of days (about 3 days)  and feels like she has parasites in her left leg.  She also insists that she has worms and parasites in her eyes and mouth and frequently picks at her body attempting to remove these wounds but no foreign bodies are visible. During my evaluation she was found to have shaking chills but denies having any fever, no shortness of breath, no chest pain, no nausea, no vomiting, no urinary symptoms, no dizziness, no lightheadedness, no headache, no blurred vision, no focal deficit, no palpitations, no diaphoresis, no anorexia or any changes in her bowel habits. Labs show sodium 138, potassium 3.3, chloride 100, bicarb 31, glucose 97, BUN 9, creatinine 0.65, calcium 9.0, alkaline phosphatase 82, albumin 3.1, AST 13, ALT 10, total protein 7.0, lactic acid 1.2, white count 5.5, hemoglobin 11.6, hematocrit 34.8, MCV 83.1, RDW 14.7, platelet count 279, Tylenol level less than 10, salicylate level less than 7 Respiratory viral panel still pending Chest x-ray reviewed by me shows no acute findings. X-ray of the left tibia and fibula shows no evidence of fracture or other focal bone lesions. Left lower extremity venous Doppler shows no femoral popliteal DVT no evidence of DVT within the visualized calf veins.  10/28 sleepy this am. No complaints of sob, cp. Leg pain still there . Venous US negative 10/29 feels her leg improving since been wrapped. Spoke to psych yesterday about her hallucinations,  they doubled her Abilify dose.  Consultants:  Surgery, psychiatry  Procedures:   Antimicrobials:   vancomycin   Subjective: No hallucinations reported this am. Sitter at bedside.   Objective: Vitals:   05/09/21 2000 05/10/21 0000 05/10/21 0441 05/10/21 0755  BP: 124/71 136/72 135/70 124/69  Pulse: 83 84 80 82  Resp: 17 17 18 19   Temp:  98 F (36.7 C) 98.3 F (36.8 C) 98.2 F (36.8 C)  TempSrc: Oral Oral  Oral  SpO2: 97% 99% 98% 99%  Weight:      Height:        Intake/Output Summary (Last 24 hours) at 05/10/2021 1117 Last data filed at 05/10/2021 5573 Gross per 24 hour  Intake 1097.65 ml  Output --  Net 1097.65 ml   Filed Weights   05/08/21 1250  Weight: 79.4 kg    Examination: Calm, nad, sitter at bedside Cta no w/r Regular s1/s2 no gallop Soft benign +bs  LLE wrapped, +edema, erythema.  Grossly intact Mood and affect appropriate   Data Reviewed: I have personally reviewed following labs and imaging studies  CBC: Recent Labs  Lab 05/08/21 1253 05/09/21 0623  WBC 5.5 5.2  NEUTROABS 2.9  --   HGB 11.6* 10.6*  HCT 34.8* 33.0*  MCV 83.1 81.9  PLT 279 273   Basic Metabolic Panel: Recent Labs  Lab 05/08/21 1253 05/09/21 0623 05/10/21 0415  NA 138 138  --   K 3.3* 3.7  --   CL 100 105  --   CO2 31 28  --   GLUCOSE 97 93  --  BUN 9 <5*  --   CREATININE 0.65 0.62 0.72  CALCIUM 9.0 8.4*  --    GFR: Estimated Creatinine Clearance: 76.9 mL/min (by C-G formula based on SCr of 0.72 mg/dL). Liver Function Tests: Recent Labs  Lab 05/08/21 1253  AST 13*  ALT 10  ALKPHOS 82  BILITOT 0.4  PROT 7.0  ALBUMIN 3.1*   No results for input(s): LIPASE, AMYLASE in the last 168 hours. No results for input(s): AMMONIA in the last 168 hours. Coagulation Profile: No results for input(s): INR, PROTIME in the last 168 hours. Cardiac Enzymes: No results for input(s): CKTOTAL, CKMB, CKMBINDEX, TROPONINI in the last 168 hours. BNP (last 3 results) No  results for input(s): PROBNP in the last 8760 hours. HbA1C: No results for input(s): HGBA1C in the last 72 hours. CBG: No results for input(s): GLUCAP in the last 168 hours. Lipid Profile: No results for input(s): CHOL, HDL, LDLCALC, TRIG, CHOLHDL, LDLDIRECT in the last 72 hours. Thyroid Function Tests: No results for input(s): TSH, T4TOTAL, FREET4, T3FREE, THYROIDAB in the last 72 hours. Anemia Panel: No results for input(s): VITAMINB12, FOLATE, FERRITIN, TIBC, IRON, RETICCTPCT in the last 72 hours. Sepsis Labs: Recent Labs  Lab 05/08/21 1253  LATICACIDVEN 1.2    Recent Results (from the past 240 hour(s))  Culture, blood (single)     Status: Abnormal (Preliminary result)   Collection Time: 05/08/21  3:20 PM   Specimen: BLOOD  Result Value Ref Range Status   Specimen Description   Final    BLOOD RIGHT ANTECUBITAL Performed at [facility], [address], [place], [place] [address]    Special Requests   Final    BOTTLES DRAWN AEROBIC AND ANAEROBIC Blood Culture adequate volume Performed at [facility], [address], [place], [place] [address]    Culture  Setup Time   Final    GRAM POSITIVE COCCI ANAEROBIC BOTTLE ONLY CRITICAL RESULT CALLED TO, READ BACK BY AND VERIFIED WITH: JASON ROBBINS, PHARMD AT 0720 ON 05/09/21 BY GM    Culture (A)  Final    STAPHYLOCOCCUS EPIDERMIDIS THE SIGNIFICANCE OF ISOLATING THIS ORGANISM FROM A SINGLE SET OF BLOOD CULTURES WHEN MULTIPLE SETS ARE DRAWN IS UNCERTAIN. PLEASE NOTIFY THE MICROBIOLOGY DEPARTMENT WITHIN ONE WEEK IF SPECIATION AND SENSITIVITIES ARE REQUIRED. Performed at [facility], [address], [place], [place] [address]    Report Status PENDING  Incomplete  Resp Panel by RT-PCR (Flu A&B, Covid) Nasopharyngeal Swab     Status: None   Collection Time: 05/08/21  3:20 PM   Specimen: Nasopharyngeal Swab; Nasopharyngeal(NP) swabs in vial transport medium  Result Value Ref Range Status   SARS Coronavirus  2 by RT PCR NEGATIVE NEGATIVE Final    Comment: (NOTE) SARS-CoV-2 target nucleic acids are NOT DETECTED.  The SARS-CoV-2 RNA is generally detectable in upper respiratory specimens during the acute phase of infection. The lowest concentration of SARS-CoV-2 viral copies this assay can detect is 138 copies/mL. A negative result does not preclude SARS-Cov-2 infection and should not be used as the sole basis for treatment or other patient management decisions. A negative result may occur with  improper specimen collection/handling, submission of specimen other than nasopharyngeal swab, presence of viral mutation(s) within the areas targeted by this assay, and inadequate number of viral copies(<138 copies/mL). A negative result must be combined with clinical observations, patient history, and epidemiological information. The expected result is Negative.  Fact Sheet for Patients:  BloggerCourse.com  Fact Sheet for Healthcare  Providers:  SeriousBroker.it  This test is no t yet approved or cleared by the [place] and  has been authorized for detection and/or diagnosis of SARS-CoV-2 by FDA under an Emergency Use Authorization (EUA). This EUA will remain  in effect (meaning this test can be used) for the duration of the COVID-19 declaration under Section 564(b)(1) of the Act, 21 U.S.C.section 360bbb-3(b)(1), unless the authorization is terminated  or revoked sooner.       Influenza A by PCR NEGATIVE NEGATIVE Final   Influenza B by PCR NEGATIVE NEGATIVE Final    Comment: (NOTE) The Xpert Xpress SARS-CoV-2/FLU/RSV plus assay is intended as an aid in the diagnosis of influenza from Nasopharyngeal swab specimens and should not be used as a sole basis for treatment. Nasal washings and aspirates are unacceptable for Xpert Xpress SARS-CoV-2/FLU/RSV testing.  Fact Sheet for Patients: BloggerCourse.com  Fact  Sheet for Healthcare Providers: SeriousBroker.it  This test is not yet approved or cleared by the [place] FDA and has been authorized for detection and/or diagnosis of SARS-CoV-2 by FDA under an Emergency Use Authorization (EUA). This EUA will remain in effect (meaning this test can be used) for the duration of the COVID-19 declaration under Section 564(b)(1) of the Act, 21 U.S.C. section 360bbb-3(b)(1), unless the authorization is terminated or revoked.  Performed at [facility], [address] [place], [place] [address]   Blood Culture ID Panel (Reflexed)     Status: Abnormal   Collection Time: 05/08/21  3:20 PM  Result Value Ref Range Status   Enterococcus faecalis NOT DETECTED NOT DETECTED Final   Enterococcus Faecium NOT DETECTED NOT DETECTED Final   Listeria monocytogenes NOT DETECTED NOT DETECTED Final   Staphylococcus species DETECTED (A) NOT DETECTED Final    Comment: CRITICAL RESULT CALLED TO, READ BACK BY AND VERIFIED WITH: JASON ROBBINS, PHARMD AT 0720 ON 05/09/21 BY GM    Staphylococcus aureus (BCID) NOT DETECTED NOT DETECTED Final   Staphylococcus epidermidis DETECTED (A) NOT DETECTED Final    Comment: Methicillin (oxacillin) resistant coagulase negative staphylococcus. Possible blood culture contaminant (unless isolated from more than one blood culture draw or clinical case suggests pathogenicity). No antibiotic treatment is indicated for blood  culture contaminants. CRITICAL RESULT CALLED TO, READ BACK BY AND VERIFIED WITH: JASON ROBBINS, PHARMD AT 0720 ON 05/09/21 BY GM    Staphylococcus lugdunensis NOT DETECTED NOT DETECTED Final   Streptococcus species NOT DETECTED NOT DETECTED Final   Streptococcus agalactiae NOT DETECTED NOT DETECTED Final   Streptococcus pneumoniae NOT DETECTED NOT DETECTED Final   Streptococcus pyogenes NOT DETECTED NOT DETECTED Final   A.calcoaceticus-baumannii NOT DETECTED NOT DETECTED Final    Bacteroides fragilis NOT DETECTED NOT DETECTED Final   Enterobacterales NOT DETECTED NOT DETECTED Final   Enterobacter cloacae complex NOT DETECTED NOT DETECTED Final   Escherichia coli NOT DETECTED NOT DETECTED Final   Klebsiella aerogenes NOT DETECTED NOT DETECTED Final   Klebsiella oxytoca NOT DETECTED NOT DETECTED Final   Klebsiella pneumoniae NOT DETECTED NOT DETECTED Final   Proteus species NOT DETECTED NOT DETECTED Final   Salmonella species NOT DETECTED NOT DETECTED Final   Serratia marcescens NOT DETECTED NOT DETECTED Final   Haemophilus influenzae NOT DETECTED NOT DETECTED Final   Neisseria meningitidis NOT DETECTED NOT DETECTED Final   Pseudomonas aeruginosa NOT DETECTED NOT DETECTED Final   Stenotrophomonas maltophilia NOT DETECTED NOT DETECTED Final   Candida albicans NOT DETECTED NOT DETECTED Final   Candida auris NOT DETECTED NOT DETECTED  Final   Candida glabrata NOT DETECTED NOT DETECTED Final   Candida krusei NOT DETECTED NOT DETECTED Final   Candida parapsilosis NOT DETECTED NOT DETECTED Final   Candida tropicalis NOT DETECTED NOT DETECTED Final   Cryptococcus neoformans/gattii NOT DETECTED NOT DETECTED Final   Methicillin resistance mecA/C DETECTED (A) NOT DETECTED Final    Comment: CRITICAL RESULT CALLED TO, READ BACK BY AND VERIFIED WITH: Princella Ion, PHARMD AT 0720 ON 05/09/21 BY GM Performed at [facility], [address] [place], [place] [address]   CULTURE, BLOOD (ROUTINE X 2) w Reflex to ID Panel     Status: None (Preliminary result)   Collection Time: 05/09/21  8:19 AM   Specimen: BLOOD  Result Value Ref Range Status   Specimen Description BLOOD LEFT ARM  Final   Special Requests   Final    BOTTLES DRAWN AEROBIC AND ANAEROBIC Blood Culture adequate volume   Culture   Final    NO GROWTH < 24 HOURS Performed at [facility], [address] [place], [place] [address]    Report Status PENDING  Incomplete  CULTURE, BLOOD (ROUTINE X  2) w Reflex to ID Panel     Status: None (Preliminary result)   Collection Time: 05/09/21  8:24 AM   Specimen: BLOOD  Result Value Ref Range Status   Specimen Description BLOOD RIGHT HAND  Final   Special Requests   Final    BOTTLES DRAWN AEROBIC AND ANAEROBIC Blood Culture adequate volume   Culture   Final    NO GROWTH < 24 HOURS Performed at [facility], [address], [place], [place] [address]    Report Status PENDING  Incomplete         Radiology Studies: DG Chest 2 View  Result Date: 05/08/2021 CLINICAL DATA:  Leg wound infection EXAM: CHEST - 2 VIEW COMPARISON:  01/15/2021 FINDINGS: Normal heart size, mediastinal contours, and pulmonary vascularity. Lungs clear. No pleural effusion or pneumothorax. Bones unremarkable. IMPRESSION: No acute abnormalities. Electronically Signed   By: Ulyses Southward M.D.   On: 05/08/2021 14:33   DG Tibia/Fibula Left  Result Date: 05/08/2021 CLINICAL DATA:  Left leg pain and swelling with open wounds. EXAM: LEFT TIBIA AND FIBULA - 2 VIEW COMPARISON:  None. FINDINGS: There is no evidence of fracture or other focal bone lesions. Soft tissues are unremarkable. IMPRESSION: Negative. Electronically Signed   By: Lupita Raider M.D.   On: 05/08/2021 14:34   US Venous Img Lower Unilateral Left  Result Date: 05/08/2021 CLINICAL DATA:  Edema, pain EXAM: LEFT LOWER EXTREMITY VENOUS DOPPLER ULTRASOUND TECHNIQUE: Gray-scale sonography with compression, as well as color and duplex ultrasound, were performed to evaluate the deep venous system(s) from the level of the common femoral vein through the popliteal and proximal calf veins. COMPARISON:  01/15/2021 FINDINGS: VENOUS Normal compressibility of the common femoral, superficial femoral, and popliteal veins, as well as the visualized calf veins. Visualized portions of profunda femoral vein and great saphenous vein unremarkable. No filling defects to suggest DVT on grayscale or color Doppler imaging.  Doppler waveforms show normal direction of venous flow, normal respiratory phasicity and response to augmentation. Limited views of the contralateral common femoral vein are unremarkable. OTHER Prominent subcentimeter left inguinal lymph nodes. Subcutaneous edema in the calf. Limitations: none IMPRESSION: No femoropopliteal DVT nor evidence of DVT within the visualized calf veins. If clinical symptoms are inconsistent or if there are persistent or worsening symptoms, further imaging (possibly involving the iliac  veins) may be warranted. Electronically Signed   By: Corlis Leak M.D.   On: 05/08/2021 15:30        Scheduled Meds:  acyclovir  400 mg Oral BID   ARIPiprazole  20 mg Oral Daily   atorvastatin  10 mg Oral Daily   citalopram  10 mg Oral Daily   enoxaparin (LOVENOX) injection  40 mg Subcutaneous QHS   influenza vac split quadrivalent PF  0.5 mL Intramuscular Tomorrow-1000   linaclotide  290 mcg Oral QAC breakfast   mometasone-formoterol  2 puff Inhalation BID   omega-3 acid ethyl esters  2 g Oral Daily   pantoprazole  40 mg Oral Daily   sodium chloride flush  3 mL Intravenous Q12H   tiotropium  18 mcg Inhalation Daily   Continuous Infusions:  sodium chloride     vancomycin 750 mg (05/10/21 0955)    Assessment & Plan:   Principal Problem:   Cellulitis and abscess of left leg Active Problems:   Bipolar 1 disorder (HCC)   COPD (chronic obstructive pulmonary disease) (HCC)   Ekbom's delusional parasitosis (HCC)   Hypokalemia   Patient is a 60 year old female who presents to the ER for evaluation of pain, redness and swelling involving her left lower extremity.  She has a history of necrotizing fasciitis involving her left lower extremity.         Cellulitis of left leg Lt Lymphedema: 1 out of 2 cultures of staph epi, possible contaminant Repeating blood cultures pending Wound care input was appreciated, currently lower extremity wrapped-please see their consult note Keep  legs elevated General surgery following Concern patient's hallucination is causing her to pick her legs.  Need to control underlying hallucination in order for her wound to heal. Continue IV antibiotics        Delusional parasitosis Patient has delusions of having parasites in her left leg, mouth and eyes 11/29 this a.m. denies any delusion/hallucination Has underlying history of bipolar Psych following, spoke to Dr. Sandria Senter recommended doubling Abilify dose 10mg  >>20mg  now. Continue xanax rpn Was told that pt was IVC'd? Plan to check on this.         Hypokalemia Most likely related to diuretic therapy 10/29 repleted and stable   COPD No acute exacerbation Continue inhalers        DVT prophylaxis: Lovenox Code Status: Full Family Communication: None at bedside Disposition Plan: Back home Status is: Inpatient  Remains inpatient appropriate because: Requires IV treatment due to infection            LOS: 2 days   Time spent: 35 minutes with more than 50% on COC    11/29, MD [facility] Pager [phone]  If 7PM-7AM, please contact night-coverage 05/10/2021, 11:17 AM

## 2021-05-10 NOTE — Progress Notes (Signed)
Patient ID: Kaylee [name], female   DOB: 12-24-1960, 60 y.o.   MRN: [id number]     SURGICAL PROGRESS NOTE   Hospital Day(s): 2.   Interval History: Patient seen and examined, no acute events or new complaints overnight. Patient reports feeling better.  She endorses that the pain is better with improved swelling.  She does have some hip pain.    Vital signs in last 24 hours: [min-max] current  Temp:  [98 F (36.7 C)-98.3 F (36.8 C)] 98.2 F (36.8 C) (10/29 0755) Pulse Rate:  [79-92] 82 (10/29 0755) Resp:  [16-19] 19 (10/29 0755) BP: (124-145)/(69-78) 124/69 (10/29 0755) SpO2:  [97 %-100 %] 99 % (10/29 0755)     Height: 5\' 5"  (165.1 cm) Weight: 79.4 kg BMI (Calculated): 29.12   Physical Exam:  Constitutional: alert, cooperative and no distress  Respiratory: breathing non-labored at rest  Cardiovascular: regular rate and sinus rhythm  Gastrointestinal: soft, non-tender, and non-distended Extremitiy: Left leg covered with Kerlix and Ace bandage.  There is significant improvement of the swelling with good wrinkling of the skin.  No significant cellulitis.  No purulence.  Labs:  CBC Latest Ref Rng & Units 05/09/2021 05/08/2021 02/03/2021  WBC 4.0 - 10.5 K/uL 5.2 5.5 6.4  Hemoglobin 12.0 - 15.0 g/dL 10.6(L) 11.6(L) 8.5(L)  Hematocrit 36.0 - 46.0 % 33.0(L) 34.8(L) 26.4(L)  Platelets 150 - 400 K/uL 273 279 391   CMP Latest Ref Rng & Units 05/10/2021 05/09/2021 05/08/2021  Glucose 70 - 99 mg/dL - 93 97  BUN 8 - 23 mg/dL - 05/10/2021) 9  Creatinine <6(P - 1.00 mg/dL 9.50 9.32 6.71  Sodium 135 - 145 mmol/L - 138 138  Potassium 3.5 - 5.1 mmol/L - 3.7 3.3(L)  Chloride 98 - 111 mmol/L - 105 100  CO2 22 - 32 mmol/L - 28 31  Calcium 8.9 - 10.3 mg/dL - 2.45) 9.0  Total Protein 6.5 - 8.1 g/dL - - 7.0  Total Bilirubin 0.3 - 1.2 mg/dL - - 0.4  Alkaline Phos 38 - 126 U/L - - 82  AST 15 - 41 U/L - - 13(L)  ALT 0 - 44 U/L - - 10    Imaging studies: No new pertinent imaging  studies   Assessment/Plan:  60 y.o. female with parasitosis hallucinations and severe lymphedema of left leg.   Patient with significant improvement of the leg swelling with likely ablation and compression.  Again I think that the treatment of the left leg swelling is secondary to her psychiatric treatment.  My concern with this patient is the acute over chronic hallucinations.  Yes patient has been chronically seen and warms in her body but every time that she sees them she started picking her skin and might cause recurrent infection.  I hope that doing the admission she gets stabilized from the psychiatric standpoint enough that when she goes home she continue to have her hallucination under control with adequate outpatient follow-up with psychiatry.  I am pretty sure that if her parasitosis hallucinations are controlled her legs cannot be under control.  After her initial surgery I was seeing this patient weekly in my office for wound care and everything was healing well until she relapses with her hallucinations.  From surgical standpoint I agree with current management and local care.  We will continue to follow if patient stays in the hospital.  77, MD

## 2021-05-11 DIAGNOSIS — L03116 Cellulitis of left lower limb: Secondary | ICD-10-CM | POA: Diagnosis not present

## 2021-05-11 DIAGNOSIS — L02416 Cutaneous abscess of left lower limb: Secondary | ICD-10-CM | POA: Diagnosis not present

## 2021-05-11 LAB — CULTURE, BLOOD (SINGLE): Special Requests: ADEQUATE

## 2021-05-11 MED ORDER — ARIPIPRAZOLE 20 MG PO TABS: 20.0000 mg | ORAL_TABLET | Freq: Every day | ORAL | 0 refills | Status: DC

## 2021-05-11 MED ORDER — INFLUENZA VAC SPLIT QUAD 0.5 ML IM SUSY
0.5000 mL | PREFILLED_SYRINGE | Freq: Once | INTRAMUSCULAR | Status: AC
  Administered 2021-05-11: 0.5 mL via INTRAMUSCULAR
  Filled 2021-05-11: qty 0.5

## 2021-05-11 MED ORDER — VANCOMYCIN HCL 1750 MG/350ML IV SOLN
1750.0000 mg | INTRAVENOUS | Status: DC
  Administered 2021-05-11: 1750 mg via INTRAVENOUS
  Filled 2021-05-11: qty 350

## 2021-05-11 NOTE — Discharge Summary (Signed)
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 of influenza from Nasopharyngeal swab specimens and should not be used as Kaylee sole basis for treatment. Nasal washings and aspirates are unacceptable for Xpert Xpress SARS-CoV-2/FLU/RSV testing.  Fact Sheet for Patients: BloggerCourse.com  Fact Sheet for Healthcare Providers: SeriousBroker.it  This test is not yet approved or cleared by the [place] FDA and has been  authorized for detection and/or diagnosis of SARS-CoV-2 by FDA under an Emergency Use Authorization (EUA). This EUA will remain in effect (meaning this test can be used) for the duration of the COVID-19 declaration under Section 564(b)(1) of the Act, 21 U.S.C. section 360bbb-3(b)(1), unless the authorization is terminated or revoked.  Performed at [facility], [address] [place], [place] [address]   Blood Culture ID Panel (Reflexed)     Status: Abnormal   Collection Time: 05/08/21  3:20 PM  Result Value Ref Range Status   Enterococcus faecalis NOT DETECTED NOT DETECTED Final   Enterococcus Faecium NOT DETECTED NOT DETECTED Final   Listeria monocytogenes NOT DETECTED NOT DETECTED Final   Staphylococcus species DETECTED (Kaylee) NOT DETECTED Final    Comment: CRITICAL RESULT CALLED TO, READ BACK BY AND VERIFIED WITH: JASON ROBBINS, PHARMD AT 0720 ON 05/09/21 BY GM    Staphylococcus aureus (BCID) NOT DETECTED NOT DETECTED Final   Staphylococcus epidermidis DETECTED (Kaylee) NOT DETECTED Final    Comment: Methicillin (oxacillin) resistant coagulase negative staphylococcus. Possible blood culture contaminant (unless isolated from more than one blood culture draw or clinical case suggests pathogenicity). No antibiotic treatment is indicated for blood  culture contaminants. CRITICAL RESULT CALLED TO, READ BACK BY AND VERIFIED WITH: JASON ROBBINS, PHARMD AT 0720 ON 05/09/21 BY GM    Staphylococcus lugdunensis NOT DETECTED NOT DETECTED Final   Streptococcus species NOT DETECTED NOT DETECTED Final   Streptococcus agalactiae NOT DETECTED NOT DETECTED Final   Streptococcus pneumoniae NOT DETECTED NOT DETECTED Final   Streptococcus pyogenes NOT DETECTED NOT DETECTED Final   Kaylee.calcoaceticus-baumannii NOT DETECTED NOT DETECTED Final   Bacteroides fragilis NOT DETECTED NOT DETECTED Final   Enterobacterales NOT DETECTED NOT DETECTED Final   Enterobacter cloacae complex NOT DETECTED NOT  DETECTED Final   Escherichia coli NOT DETECTED NOT DETECTED Final   Klebsiella aerogenes NOT DETECTED NOT DETECTED Final   Klebsiella oxytoca NOT DETECTED NOT DETECTED Final   Klebsiella pneumoniae NOT DETECTED NOT DETECTED Final   Proteus species NOT DETECTED NOT DETECTED Final   Salmonella species NOT DETECTED NOT DETECTED Final   Serratia marcescens NOT DETECTED NOT DETECTED Final   Haemophilus influenzae NOT DETECTED NOT DETECTED Final   Neisseria meningitidis NOT DETECTED NOT DETECTED Final   Pseudomonas aeruginosa NOT DETECTED NOT DETECTED Final   Stenotrophomonas maltophilia NOT DETECTED NOT DETECTED  Final   Candida albicans NOT DETECTED NOT DETECTED Final   Candida auris NOT DETECTED NOT DETECTED Final   Candida glabrata NOT DETECTED NOT DETECTED Final   Candida krusei NOT DETECTED NOT DETECTED Final   Candida parapsilosis NOT DETECTED NOT DETECTED Final   Candida tropicalis NOT DETECTED NOT DETECTED Final   Cryptococcus neoformans/gattii NOT DETECTED NOT DETECTED Final   Methicillin resistance mecA/C DETECTED (Kaylee) NOT DETECTED Final    Comment: CRITICAL RESULT CALLED TO, READ BACK BY AND VERIFIED WITH: JASON ROBBINS, PHARMD AT 0720 ON 05/09/21 BY GM Performed at [facility], [address] [place], [place] [address]   CULTURE, BLOOD (ROUTINE X 2) w Reflex to ID Panel     Status: None (Preliminary result)   Collection Time: 05/09/21  8:19 AM   Specimen: BLOOD  Result Value Ref Range Status   Specimen Description BLOOD LEFT ARM  Final   Special Requests   Final    BOTTLES DRAWN AEROBIC AND ANAEROBIC Blood Culture adequate volume   Culture   Final    NO GROWTH 2 DAYS Performed at [facility], [address] [place], [place] [address]    Report Status PENDING  Incomplete  CULTURE, BLOOD (ROUTINE X 2) w Reflex to ID Panel     Status: None (Preliminary result)   Collection Time: 05/09/21  8:24 AM   Specimen: BLOOD  Result Value Ref Range Status    Specimen Description BLOOD RIGHT HAND  Final   Special Requests   Final    BOTTLES DRAWN AEROBIC AND ANAEROBIC Blood Culture adequate volume   Culture   Final    NO GROWTH 2 DAYS Performed at [facility], [address], [place], [place] [address]    Report Status PENDING  Incomplete     Time coordinating discharge: Over 30 minutes  SIGNED:   Lynn Ito, MD  [facility] 05/11/2021, 4:06 PM Pager   If 7PM-7AM, please contact night-coverage [email or web address] Password [email or web address]
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
## 2021-05-11 NOTE — Plan of Care (Signed)
Patient discharged home per MD orders at this time.All discharge instructions,education and medications reviewed with patient at the bedside.Pt expressed understanding and will comply with dc instructions.follow up appointments was also communicated to patient no verbal c/o or any ssx of distress.Pt was discharged home with HH/wound care per MD orders.patient transported self home in a POV.

## 2021-05-11 NOTE — Progress Notes (Signed)
Patient ID: Kaylee [name], female   DOB: [birth year], 60 y.o.   MRN: [id number]     SURGICAL PROGRESS NOTE   Hospital Day(s): 3.   Interval History: Patient seen and examined, no acute events or new complaints overnight. Patient reports feeling well.  She endorsed that the pain of the left leg is improving.  She denies any issues with the dressing.  Vital signs in last 24 hours: [min-max] current  Temp:  [97.9 F (36.6 C)-98.8 F (37.1 C)] 97.9 F (36.6 C) (10/30 0728) Pulse Rate:  [75-92] 78 (10/30 0728) Resp:  [15-17] 15 (10/30 0728) BP: (118-137)/(59-76) 137/73 (10/30 0728) SpO2:  [96 %-98 %] 97 % (10/30 0728)     Height: 5\' 5"  (165.1 cm) Weight: 79.4 kg BMI (Calculated): 29.12   Physical Exam:  Constitutional: alert, cooperative and no distress  Respiratory: breathing non-labored at rest  Cardiovascular: regular rate and sinus rhythm  Gastrointestinal: soft, non-tender, and non-distended Extremity: Left leg with significantly improved swelling and redness.  No drainage.  Labs:  CBC Latest Ref Rng & Units 05/09/2021 05/08/2021 02/03/2021  WBC 4.0 - 10.5 K/uL 5.2 5.5 6.4  Hemoglobin 12.0 - 15.0 g/dL 10.6(L) 11.6(L) 8.5(L)  Hematocrit 36.0 - 46.0 % 33.0(L) 34.8(L) 26.4(L)  Platelets 150 - 400 K/uL 273 279 391   CMP Latest Ref Rng & Units 05/10/2021 05/09/2021 05/08/2021  Glucose 70 - 99 mg/dL - 93 97  BUN 8 - 23 mg/dL - 05/10/2021) 9  Creatinine <6(N - 1.00 mg/dL 6.29 5.28 4.13  Sodium 135 - 145 mmol/L - 138 138  Potassium 3.5 - 5.1 mmol/L - 3.7 3.3(L)  Chloride 98 - 111 mmol/L - 105 100  CO2 22 - 32 mmol/L - 28 31  Calcium 8.9 - 10.3 mg/dL - 2.44) 9.0  Total Protein 6.5 - 8.1 g/dL - - 7.0  Total Bilirubin 0.3 - 1.2 mg/dL - - 0.4  Alkaline Phos 38 - 126 U/L - - 82  AST 15 - 41 U/L - - 13(L)  ALT 0 - 44 U/L - - 10    Imaging studies: No new pertinent imaging studies   Assessment/Plan:  60 y.o. female with parasitosis hallucinations and severe lymphedema of left leg.    Left leg swelling has significantly improved with compressive dressing.  The redness of the left leg has improved as well.  In my opinion the suspected cellulitis that was seen on admission was due to the severe swelling and not due to active infection.  The best local care for this patient will be compressive dressing with Unna boot.  No indication for any debridement at this moment.  Continue medical management as per primary team.  Continue psychiatric management as per psychiatrist recommendation.  77, MD

## 2021-05-11 NOTE — TOC Progression Note (Addendum)
Transition of Care (TOC) - Progression Note    Patient Details  Name: Kaylee [name] MRN: [id number] Date of Birth: [birth year]  Transition of Care [facility]) CM/SW [place], RN Phone Number: 05/11/2021, 8:55 AM  Clinical Narrative:    Met with the patient in the room and spoke with the Surgeon The patient will need Ardelia Mems Boots 2 times per week at DC She lives with her son in [place] at [employer] and cares for her 2 grand children during the day She does drive but with her left leg in the condition it is in, she is home bound I reached out to [place] at [place], they do not have staffing for Nursing  at this time I reached out to [place] and am awaiting a response  I reached out to [place] at [place] and they are unable to accept the patient at this time I reached out to [facility] and they are unable to accept the patient at this time I reached out to [place] and am awaiting a call back I reached out to [facility] at [facility] and they are unable to accept the patient at this time The patient reports that she is independent at home without using any DME She is having some Psych issues of seeing parasites in her eyes and on her body, has been seen by the [facility] team here at [facility], I provided her with outpatient psych resources and encouraged her to make an appointment or to go to the walk in at [facility] in [place],  She stated that she is fine. She stated that only when she gets infection does she see these parasites and she knows they are not real, She stated that she has seen a psychiatrist outpatient and now her PCP manages her Bipolar medications [facility] has accepted the patient for Nursing    TOC to continue to follow for additional needs        Expected Discharge Plan and Services                                                 Social Determinants of Health (SDOH) Interventions    Readmission  Risk Interventions No flowsheet data found.

## 2021-05-12 ENCOUNTER — Telehealth: Payer: Self-pay

## 2021-05-12 ENCOUNTER — Telehealth: Payer: Self-pay | Admitting: Family Medicine

## 2021-05-12 NOTE — Telephone Encounter (Signed)
I understand she had abilify increased in hospital however still needs to f/u outpatient with psychiatrist - please keep 11/4 appointment with Dr Elna Breslow.

## 2021-05-12 NOTE — Telephone Encounter (Signed)
Transition Care Management Follow-up Telephone Call Date of discharge and from where: 04/3021 from [facility] How have you been since you were released from the hospital? Patient states that she has been alright but has had some foot swelling. Any questions or concerns? No  Items Reviewed: Did the pt receive and understand the discharge instructions provided? Yes  Medications obtained and verified? Yes  Other? No  Any new allergies since your discharge? No  Dietary orders reviewed? Yes Do you have support at home? No , patient states that she has to manage care on her own.  Home Care and Equipment/Supplies: Were home health services ordered? Yes, patient states that she has to drive to facility for wound care. If so, what is the name of the agency? N/A  Has the agency set up a time to come to the patient's home? not applicable Were any new equipment or medical supplies ordered?  No What is the name of the medical supply agency? N/A Were you able to get the supplies/equipment? not applicable Do you have any questions related to the use of the equipment or supplies? No  Functional Questionnaire: (I = Independent and D = Dependent) ADLs: I  Bathing/Dressing- I  Meal Prep- I  Eating- I  Maintaining continence- I  Transferring/Ambulation- I  Managing Meds- I  Follow up appointments reviewed:  PCP Hospital f/u appt confirmed? Yes  Scheduled to see Dr. Denton Meek on 05/26/21 @ 11:00am. Specialist Hospital f/u appt confirmed? Yes  Scheduled to see Dr  Shela Commons. Hoffman on 05/21/21 @ 8:45am. Are transportation arrangements needed? No  If their condition worsens, is the pt aware to call PCP or go to the Emergency Dept.? Yes Was the patient provided with contact information for the PCP's office or ED? Yes Was to pt encouraged to call back with questions or concerns? Yes

## 2021-05-12 NOTE — Telephone Encounter (Signed)
Called the patient and informed her that she needed to set up an appointment with Dr. Reece Agar. Patient stated that she understood. When asked about the appointment for the 4th the hospital had already seen the patient and upped her medication. Patient is scheduled for 05/26/2021.

## 2021-05-12 NOTE — Telephone Encounter (Signed)
Please call for hospital f/u visit.  Pt needs to keep her psychiatry appt scheduled for 11/4.  Please schedule hosp f/u visit over the next 2 wks.

## 2021-05-13 NOTE — Telephone Encounter (Signed)
Lvm asking pt to call back.  Need to relay Dr. G's message.  

## 2021-05-14 LAB — CULTURE, BLOOD (ROUTINE X 2)
Culture: NO GROWTH
Culture: NO GROWTH
Special Requests: ADEQUATE
Special Requests: ADEQUATE

## 2021-05-14 NOTE — Telephone Encounter (Signed)
Spoke with pt relaying Dr. Timoteo Expose message.  Pt verbalizes understanding but says she was not aware of an appt with Dr. Elna Breslow.  Offered to provide Dr. Elvera Maria address and office phn #:   [facility] [address] [place], [address] [place] [phone]  Pt was having car serviced and wasn't able to write the info.  Says she will check her ppw when she gets home.  If not able to locate, she will call back for the phn # to call their office.

## 2021-05-16 ENCOUNTER — Ambulatory Visit: Payer: Medicare Other | Admitting: Psychiatry

## 2021-05-19 DIAGNOSIS — J449 Chronic obstructive pulmonary disease, unspecified: Secondary | ICD-10-CM | POA: Diagnosis not present

## 2021-05-19 DIAGNOSIS — L97922 Non-pressure chronic ulcer of unspecified part of left lower leg with fat layer exposed: Secondary | ICD-10-CM | POA: Diagnosis not present

## 2021-05-19 DIAGNOSIS — K219 Gastro-esophageal reflux disease without esophagitis: Secondary | ICD-10-CM | POA: Diagnosis not present

## 2021-05-19 DIAGNOSIS — G894 Chronic pain syndrome: Secondary | ICD-10-CM | POA: Diagnosis not present

## 2021-05-19 DIAGNOSIS — Z9181 History of falling: Secondary | ICD-10-CM | POA: Diagnosis not present

## 2021-05-19 DIAGNOSIS — Z7951 Long term (current) use of inhaled steroids: Secondary | ICD-10-CM | POA: Diagnosis not present

## 2021-05-19 DIAGNOSIS — L03116 Cellulitis of left lower limb: Secondary | ICD-10-CM | POA: Diagnosis not present

## 2021-05-21 ENCOUNTER — Ambulatory Visit: Payer: Medicare Other | Admitting: Internal Medicine

## 2021-05-26 ENCOUNTER — Ambulatory Visit: Payer: Medicare Other | Admitting: Family Medicine

## 2021-05-27 ENCOUNTER — Telehealth: Payer: Self-pay | Admitting: Family Medicine

## 2021-05-27 DIAGNOSIS — G894 Chronic pain syndrome: Secondary | ICD-10-CM | POA: Diagnosis not present

## 2021-05-27 DIAGNOSIS — L03116 Cellulitis of left lower limb: Secondary | ICD-10-CM | POA: Diagnosis not present

## 2021-05-27 DIAGNOSIS — Z7951 Long term (current) use of inhaled steroids: Secondary | ICD-10-CM | POA: Diagnosis not present

## 2021-05-27 DIAGNOSIS — J449 Chronic obstructive pulmonary disease, unspecified: Secondary | ICD-10-CM | POA: Diagnosis not present

## 2021-05-27 DIAGNOSIS — Z9181 History of falling: Secondary | ICD-10-CM | POA: Diagnosis not present

## 2021-05-27 DIAGNOSIS — L97922 Non-pressure chronic ulcer of unspecified part of left lower leg with fat layer exposed: Secondary | ICD-10-CM | POA: Diagnosis not present

## 2021-05-27 DIAGNOSIS — K219 Gastro-esophageal reflux disease without esophagitis: Secondary | ICD-10-CM | POA: Diagnosis not present

## 2021-05-27 NOTE — Telephone Encounter (Signed)
Patient has called to cancel her appointment for 11.16.22 because she is unable to make it out here due to findings of parasites in her legs. She does have an at home nurse changing the dressing and caring for it daily for her.  She did go ahead and make a physical appointement for January but she is requesting to have a STOOL and URINE culture done   Please advise

## 2021-05-27 NOTE — Telephone Encounter (Signed)
She no showed psychiatry appointment early this month despite asking her multiple times to keep that appointment.  I recommend she keep this weeks hospital follow up appointment to review medication changes made while hospitalized. Please reschedule tomorrow's appt and ask her to keep it.

## 2021-05-28 ENCOUNTER — Ambulatory Visit: Payer: Medicare Other | Admitting: Family Medicine

## 2021-05-28 DIAGNOSIS — S81812D Laceration without foreign body, left lower leg, subsequent encounter: Secondary | ICD-10-CM | POA: Diagnosis not present

## 2021-05-28 NOTE — Telephone Encounter (Signed)
Spoke with pt relaying Dr. Timoteo Expose message.  Pt verbalizes understanding but states it's hard for her to get around right now.  Says they want her back at the hospital but says she won't go.  Pt wants to wait until Dr. Reece Agar is back at [facility].  Says she has her cpe 07/25/21 and wants to wait to f/u with Dr. Reece Agar then.

## 2021-06-03 ENCOUNTER — Other Ambulatory Visit: Payer: Self-pay | Admitting: Family Medicine

## 2021-06-03 NOTE — Telephone Encounter (Signed)
Name of Medication: Oxycodone-APAP, Alprazolam, Diazepam Name of Pharmacy: [facility] Last [place] or Written Date and Quantity: 05/09/21      Oxycodone-APAP- #90      Alprazolam- #10      Diazepam- #60 Last Office Visit and Type: 04/11/21, 3 mo f/u Next Office Visit and Type: 07/25/21, AWV Last Controlled Substance Agreement Date: 09/06/20 Last UDS: 09/06/20

## 2021-06-04 DIAGNOSIS — G894 Chronic pain syndrome: Secondary | ICD-10-CM | POA: Diagnosis not present

## 2021-06-04 DIAGNOSIS — F319 Bipolar disorder, unspecified: Secondary | ICD-10-CM | POA: Diagnosis not present

## 2021-06-04 DIAGNOSIS — K219 Gastro-esophageal reflux disease without esophagitis: Secondary | ICD-10-CM | POA: Diagnosis not present

## 2021-06-04 DIAGNOSIS — Z7951 Long term (current) use of inhaled steroids: Secondary | ICD-10-CM | POA: Diagnosis not present

## 2021-06-04 DIAGNOSIS — L03116 Cellulitis of left lower limb: Secondary | ICD-10-CM | POA: Diagnosis not present

## 2021-06-04 DIAGNOSIS — F411 Generalized anxiety disorder: Secondary | ICD-10-CM

## 2021-06-04 DIAGNOSIS — Z9181 History of falling: Secondary | ICD-10-CM | POA: Diagnosis not present

## 2021-06-04 DIAGNOSIS — L97922 Non-pressure chronic ulcer of unspecified part of left lower leg with fat layer exposed: Secondary | ICD-10-CM | POA: Diagnosis not present

## 2021-06-04 DIAGNOSIS — J449 Chronic obstructive pulmonary disease, unspecified: Secondary | ICD-10-CM | POA: Diagnosis not present

## 2021-06-04 DIAGNOSIS — F22 Delusional disorders: Secondary | ICD-10-CM

## 2021-06-04 NOTE — Telephone Encounter (Signed)
ERx 

## 2021-06-06 DIAGNOSIS — L03116 Cellulitis of left lower limb: Secondary | ICD-10-CM | POA: Diagnosis not present

## 2021-07-08 ENCOUNTER — Other Ambulatory Visit: Payer: Self-pay | Admitting: Family Medicine

## 2021-07-08 NOTE — Telephone Encounter (Signed)
Name of Medication: Alprazolam, Diazepam, Oxycodone-APAP Name of Pharmacy: [facility] Last [place] or Written Date and Quantity: 06/06/21      Alprazolam- #10      Diazepam- #60      Oxycodone-APAP- #90 Last Office Visit and Type: 04/11/21, 3 mo f/u Next Office Visit and Type: 07/25/21, AWV prt 2 Last Controlled Substance Agreement Date: 09/06/20 Last UDS: 09/06/20

## 2021-07-09 NOTE — Telephone Encounter (Signed)
ERx 

## 2021-07-25 ENCOUNTER — Encounter: Payer: Medicare Other | Admitting: Family Medicine

## 2021-08-05 ENCOUNTER — Encounter: Payer: Medicaid Other | Admitting: Family Medicine

## 2021-08-06 DIAGNOSIS — L03116 Cellulitis of left lower limb: Secondary | ICD-10-CM | POA: Diagnosis not present

## 2021-08-12 ENCOUNTER — Telehealth: Payer: Self-pay | Admitting: Plastic Surgery

## 2021-08-12 NOTE — Telephone Encounter (Signed)
Patient left voicemail message to report slow healing and possible infection of area of foot where skin graph was received last September. Patient requesting an appointment. Please advise at [phone].

## 2021-08-13 ENCOUNTER — Telehealth: Payer: Self-pay

## 2021-08-13 NOTE — Telephone Encounter (Signed)
Either one.  If the patient wants to come here she can see Gerre Pebbles next available.  Thanks.

## 2021-08-13 NOTE — Chronic Care Management (AMB) (Signed)
Chronic Care Management Pharmacy Assistant   Name: Kaylee [name]  MRN: [id number] DOB: September 24, 1960   Reason for Encounter: Reminder Call   Conditions to be addressed/monitored: HLD and COPD   Medications: Outpatient Encounter Medications as of 08/13/2021  Medication Sig Note   acetaminophen (TYLENOL) 500 MG tablet Take 1,000 mg by mouth every 6 (six) hours as needed for mild pain.    acyclovir (ZOVIRAX) 400 MG tablet TAKE 1 TABLET BY MOUTH TWICE A DAY    ADVAIR DISKUS 250-50 MCG/ACT AEPB INHALE 1 PUFF INTO THE LUNGS TWICE DAILY    albuterol (VENTOLIN HFA) 108 (90 Base) MCG/ACT inhaler INHALE 2 PUFFS INTO THE LUNGS EVERY 4 HOURS AS NEEDED. (Patient taking differently: Inhale 2 puffs into the lungs every 4 (four) hours as needed for wheezing or shortness of breath. INHALE 2 PUFFS INTO THE LUNGS EVERY 4 HOURS AS NEEDED)    allopurinol (ZYLOPRIM) 100 MG tablet Take 100 mg by mouth daily.    ALPRAZolam (XANAX) 1 MG tablet TAKE 1 TABLET BY MOUTH DAILY AS NEEDED FOR ANXIETY    ARIPiprazole (ABILIFY) 20 MG tablet Take 1 tablet (20 mg total) by mouth daily.    atorvastatin (LIPITOR) 10 MG tablet TAKE 1 TABLET BY MOUTH DAILY (Patient taking differently: Take 10 mg by mouth daily.)    citalopram (CELEXA) 10 MG tablet TAKE 1 TABLET BY MOUTH DAILY (Patient taking differently: Take 10 mg by mouth daily.)    colchicine 0.6 MG tablet TAKE 1 TABLET BY MOUTH DAILY AS NEEDED    diazepam (VALIUM) 10 MG tablet TAKE 1 TABLET BY MOUTH TWICE A DAY    dicyclomine (BENTYL) 10 MG capsule TAKE 1 CAPSULE BY MOUTH 3 TIMES A DAY ASNEEDED FOR SPASMS. TAKE MEDICATION WITH MEALS.    diphenhydrAMINE (BENADRYL) 25 mg capsule Take 50 mg by mouth every 6 (six) hours as needed for sleep.    esomeprazole (NEXIUM) 40 MG capsule TAKE 1 CAPSULE BY MOUTH DAILY    etodolac (LODINE) 400 MG tablet TAKE 1 TABLET BY MOUTH TWICE A DAY AS NEEDED FOR MODERATE PAIN. (Patient taking differently: Take 400 mg by mouth 2 (two) times  daily.)    fluticasone (FLONASE) 50 MCG/ACT nasal spray Place 2 sprays into both nostrils daily.    furosemide (LASIX) 40 MG tablet TAKE 1 TABLET BY MOUTH DAILY AS NEEDED FOR FLUID    linaclotide (LINZESS) 290 MCG CAPS capsule Take 1 capsule (290 mcg total) by mouth daily before breakfast.    methocarbamol (ROBAXIN) 750 MG tablet TAKE 1 TABLET BY MOUTH TWICE A DAY AS NEEDED FOR MUSCLE SPASMS    MISC NATURAL PRODUCTS PO Take by mouth. Keto diet supplement pills    mupirocin ointment (BACTROBAN) 2 % Apply twice daily to open wounds    NEOMYCIN-POLYMYXIN-HYDROCORTISONE (CORTISPORIN) 1 % SOLN OTIC solution PLACE 3 DROPS INTO THE LEFT EAR 3 TIMES A DAY AS DIRECTED. (Patient not taking: No sig reported)    nitrofurantoin (MACRODANTIN) 100 MG capsule Take 100 mg by mouth daily as needed.    omega-3 acid ethyl esters (LOVAZA) 1 g capsule Take 2 capsules (2 g total) by mouth daily.    oxyCODONE-acetaminophen (PERCOCET) 7.5-325 MG tablet TAKE 1 TABLET BY MOUTH EVERY 8 HOURS AS NEEDED FOR PAIN    potassium chloride (KLOR-CON) 10 MEQ tablet TAKE 1 TABLET BY MOUTH DAILY (Patient taking differently: Take 10 mEq by mouth daily.) 01/16/2021: Patient takes as needed with Furosemide   promethazine (PHENERGAN) 12.5 MG tablet Take  1 tablet (12.5 mg total) by mouth every 8 (eight) hours as needed for nausea.    senna (SENOKOT) 8.6 MG TABS tablet Take 1 tablet (8.6 mg total) by mouth 2 (two) times daily.    SPIRIVA HANDIHALER 18 MCG inhalation capsule INHALE ONE CAPSULE AS DIRECTED ONCE A DAY    trimethoprim-polymyxin b (POLYTRIM) ophthalmic solution PLACE 1 DROP INTO THE LEFT EYE EVERY 6 HOURS (Patient not taking: No sig reported)    No facility-administered encounter medications on file as of 08/13/2021.   Kaylee [name]  did not answer the telephone to remind of upcoming telephone visit with Phil DoppMichelle Adams on 08/18/21 at 2:00pm. Patient was reminded to have all medications, supplements and any blood glucose and blood  pressure readings available for review at appointment. If unable to reach, a voicemail was left for patient.  Voice mailbox full X 2 attempts   Star Rating Drugs: Medication:  Last Fill: Day Supply Atorvastatin 10mg  05/09/21 30  Phil DoppMichelle Adams, CPP notified  Burt KnackVelmena Daundre Biel, [facility]MA Clincal Pharmacy Assistant [phone]  Total time spent for month CPA: 10 min

## 2021-08-18 ENCOUNTER — Telehealth: Payer: Self-pay

## 2021-08-18 ENCOUNTER — Other Ambulatory Visit: Payer: Self-pay | Admitting: Family Medicine

## 2021-08-18 ENCOUNTER — Telehealth: Payer: Medicare Other
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
## 2021-08-18 NOTE — Telephone Encounter (Signed)
°  Chronic Care Management   Outreach Note  08/18/2021 Name: Kaylee [name] MRN: [id number] DOB: [birth year]  Patient had a phone appointment scheduled with clinical pharmacist today.   An unsuccessful telephone outreach was attempted. The patient was referred to the pharmacist for assistance with medications, care management and care coordination.    Patient will NOT be penalized in any way for missing a CCM appointment. The no-show fee does not apply.   If possible, a message was left to return call to: [phone].   Phil Dopp, PharmD Clinical Pharmacist [facility] at [facility] [phone]

## 2021-08-18 NOTE — Telephone Encounter (Signed)
°  Encourage patient to contact the pharmacy for refills or they can request refills through [facility]  LAST APPOINTMENT DATE:  Please schedule appointment if longer than 1 year  NEXT APPOINTMENT DATE:11/25/21  MEDICATION:ALPRAZolam (XANAX) 1 MG tablet,oxyCODONE-acetaminophen (PERCOCET) 7.5-325 MG tablet,diazepam (VALIUM) 10 MG tablet  Is the patient out of medication?  [facility] - [place], [place] - [address] PHARMACY:  Let patient know to contact pharmacy at the end of the day to make sure medication is ready.  Please notify patient to allow 48-72 hours to process  CLINICAL FILLS OUT ALL BELOW:   LAST REFILL:  QTY:  REFILL DATE:    OTHER COMMENTS:    Okay for refill?  Please advise

## 2021-08-19 NOTE — Telephone Encounter (Signed)
Name of Medication: Alprazolam, Diazepam, Oxycodone-APAP Name of Pharmacy: [facility] Last [place] or Written Date and Quantity: 07/09/21      Alprazolam- #10      Diazepam- #60      Oxycocone-APAP- #90 Last Office Visit and Type: 04/11/21, 3 mo f/u Next Office Visit and Type: 11/25/21, CPE Last Controlled Substance Agreement Date: 03/27/16 Last UDS: 03/27/16

## 2021-08-19 NOTE — Addendum Note (Signed)
Addended by: Nanci Pina on: 08/19/2021 04:21 PM   Modules accepted: Orders

## 2021-08-20 ENCOUNTER — Other Ambulatory Visit: Payer: Self-pay | Admitting: Family Medicine

## 2021-08-20 MED ORDER — OXYCODONE-ACETAMINOPHEN 7.5-325 MG PO TABS: 1.0000 | ORAL_TABLET | Freq: Three times a day (TID) | ORAL | 0 refills | Status: DC | PRN

## 2021-08-20 MED ORDER — DIAZEPAM 10 MG PO TABS: 10.0000 mg | ORAL_TABLET | Freq: Two times a day (BID) | ORAL | 0 refills | Status: DC

## 2021-08-20 MED ORDER — ALPRAZOLAM 1 MG PO TABS: 1.0000 mg | ORAL_TABLET | Freq: Every day | ORAL | 0 refills | Status: DC | PRN

## 2021-08-20 NOTE — Telephone Encounter (Signed)
Name of Medication: Alprazolam, Diazepam, Oxycodone-APAP Name of Pharmacy: [facility] Last [place] or Written Date and Quantity: 07/09/21      Alprazolam- #10      Diazepam- #60      Oxycodone-APAP- #90 Last Office Visit and Type: 04/11/21, 3 mo f/u Next Office Visit and Type: 11/25/21, AWV prt 2 Last Controlled Substance Agreement Date: 09/06/20 Last UDS: 09/06/20

## 2021-08-20 NOTE — Telephone Encounter (Signed)
ERx. Pt overdue for f/u, cancelled several appointments Nov and Dec then missed appt 1/24.  Next appt 5/16.  Please schedule sooner appt for when I'm back in office at end of Feb.

## 2021-08-20 NOTE — Telephone Encounter (Signed)
Patient called back and states she is out of the medications and needs this refilled please.

## 2021-08-20 NOTE — Addendum Note (Signed)
Addended by: [name] on: 08/20/2021 06:43 PM   Modules accepted: Orders

## 2021-08-22 ENCOUNTER — Other Ambulatory Visit: Payer: Self-pay | Admitting: Family Medicine

## 2021-08-22 NOTE — Telephone Encounter (Signed)
Nitrofurantoin Last filled:  06/06/21, #90 Last OV:  04/11/21, 3 mo f/u Next OV:  09/26/21, med refill

## 2021-08-23 NOTE — Telephone Encounter (Signed)
ERx 
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
## 2021-09-03 ENCOUNTER — Encounter: Payer: Self-pay | Admitting: Physician Assistant

## 2021-09-03 ENCOUNTER — Ambulatory Visit (INDEPENDENT_AMBULATORY_CARE_PROVIDER_SITE_OTHER): Payer: Medicare Other | Admitting: Physician Assistant

## 2021-09-03 ENCOUNTER — Other Ambulatory Visit: Payer: Self-pay

## 2021-09-03 DIAGNOSIS — S81802D Unspecified open wound, left lower leg, subsequent encounter: Secondary | ICD-10-CM | POA: Diagnosis not present

## 2021-09-06 DIAGNOSIS — L03116 Cellulitis of left lower limb: Secondary | ICD-10-CM | POA: Diagnosis not present

## 2021-09-09 DIAGNOSIS — M797 Fibromyalgia: Secondary | ICD-10-CM | POA: Diagnosis not present

## 2021-09-09 DIAGNOSIS — Z79899 Other long term (current) drug therapy: Secondary | ICD-10-CM | POA: Diagnosis not present

## 2021-09-09 DIAGNOSIS — Z882 Allergy status to sulfonamides status: Secondary | ICD-10-CM | POA: Diagnosis not present

## 2021-09-09 DIAGNOSIS — M7989 Other specified soft tissue disorders: Secondary | ICD-10-CM | POA: Diagnosis not present

## 2021-09-09 DIAGNOSIS — Z87891 Personal history of nicotine dependence: Secondary | ICD-10-CM | POA: Diagnosis not present

## 2021-09-09 DIAGNOSIS — Z881 Allergy status to other antibiotic agents status: Secondary | ICD-10-CM | POA: Diagnosis not present

## 2021-09-09 DIAGNOSIS — S60445A External constriction of left ring finger, initial encounter: Secondary | ICD-10-CM | POA: Diagnosis not present

## 2021-09-09 DIAGNOSIS — L03114 Cellulitis of left upper limb: Secondary | ICD-10-CM | POA: Diagnosis not present

## 2021-09-09 DIAGNOSIS — J449 Chronic obstructive pulmonary disease, unspecified: Secondary | ICD-10-CM | POA: Diagnosis not present

## 2021-09-09 DIAGNOSIS — N189 Chronic kidney disease, unspecified: Secondary | ICD-10-CM | POA: Diagnosis not present

## 2021-09-09 DIAGNOSIS — I129 Hypertensive chronic kidney disease with stage 1 through stage 4 chronic kidney disease, or unspecified chronic kidney disease: Secondary | ICD-10-CM | POA: Diagnosis not present

## 2021-09-09 DIAGNOSIS — Z791 Long term (current) use of non-steroidal anti-inflammatories (NSAID): Secondary | ICD-10-CM | POA: Diagnosis not present

## 2021-09-09 DIAGNOSIS — Z792 Long term (current) use of antibiotics: Secondary | ICD-10-CM | POA: Diagnosis not present

## 2021-09-09 DIAGNOSIS — Z885 Allergy status to narcotic agent status: Secondary | ICD-10-CM | POA: Diagnosis not present

## 2021-09-10 ENCOUNTER — Telehealth: Payer: Self-pay | Admitting: *Deleted

## 2021-09-10 NOTE — Telephone Encounter (Signed)
Attempted to call to schedule yearly Lung CA CT Scan. VM full. ?

## 2021-09-11 ENCOUNTER — Other Ambulatory Visit: Payer: Self-pay | Admitting: Family Medicine

## 2021-09-12 NOTE — Telephone Encounter (Signed)
Name of Medication: Alprazolam, Diazepam, Oxycodone-APAP ?Name of Pharmacy: [facility] ?Last Fill or Written Date and Quantity: 08/21/21 ?     Alprazalam- #10 ?     Diazepam- #60 ?     Oxycodone-APAP- #90 ?Last Office Visit and Type: 04/11/21, 3 mo f/u ?Next Office Visit and Type: 09/26/21, med refill ?Last Controlled Substance Agreement Date: 09/06/20 ?Last UDS: 09/06/20 ? ? ?

## 2021-09-16 NOTE — Telephone Encounter (Signed)
ERx. Pt needs to keep schedule appt this month.  ?

## 2021-09-17 ENCOUNTER — Telehealth: Payer: Self-pay

## 2021-09-17 ENCOUNTER — Telehealth: Payer: Self-pay | Admitting: *Deleted

## 2021-09-17 NOTE — Telephone Encounter (Signed)
[facility] Day - Client ?Nonclinical Telephone Record  ?AccessNurse? ?Client [facility] Day - Client ?Client Site [facility] [place] - Day ?Provider [name] - MD ?Contact Type Call ?Who Is Calling Patient / Member / Family / Caregiver ?Caller Name Guillermo Minardi ?Caller Phone Number [phone] ?Patient Name Kaylee [name] ?Patient DOB [birth year] ?Call Type Message Only Information Provided ?Reason for Call Request for General Office Information ?Initial Comment Caller states was wanting to know when her appt is. ?Additional Comment Office hours provided and advised to follow up with the office. ?Disp. Time Disposition Final User ?09/17/2021 1:45:55 PM General Information Provided Yes Dorcas Carrow ?Call Closed By: Dorcas Carrow ?Transaction Date/Time: 09/17/2021 1:43:34 PM (ET ?

## 2021-09-17 NOTE — Telephone Encounter (Signed)
PLEASE NOTE: All timestamps contained within this report are represented as [place] Standard Time. ?CONFIDENTIALTY NOTICE: This fax transmission is intended only for the addressee. It contains information that is legally privileged, confidential or otherwise protected from ?use or disclosure. If you are not the intended recipient, you are strictly prohibited from reviewing, disclosing, copying using or disseminating any of this information or taking any ?action in reliance on or regarding this information. If you have received this fax in error, please notify us immediately by telephone so that we can arrange for its return to [place]. ?Phone: [phone], Toll-Free: [phone], Fax: [phone] ?Page: 1 of 1 ?Call Id: [id number] ?[facility] Day - Client ?Nonclinical Telephone Record  ?AccessNurse? ?Client  [facility] Day - Client ?Client Site [facility] [place] - Day ?Provider [name] - MD ?Contact Type Call ?Who Is Calling Patient / Member / Family / Caregiver ?Caller Name Khamiyah Kimmer ?Caller Phone Number [phone] ?Patient Name Kaylee [name] ?Patient DOB 07/06/1961 ?Call Type Message Only Information Provided ?Reason for Call Request for General Office Information ?Initial Comment Caller states was wanting to know when her appt is. ?Additional Comment Office hours provided and advised to follow up with the office. ?Disp. Time Disposition Final User ?09/17/2021 1:45:55 PM General Information Provided Yes Dorcas Carrow ?Call Closed By: Dorcas Carrow ?Transaction Date/Time: 09/17/2021 1:43:34 PM (ET) ?

## 2021-09-18 ENCOUNTER — Other Ambulatory Visit: Payer: Self-pay | Admitting: Family Medicine

## 2021-09-19 NOTE — Telephone Encounter (Signed)
Acyclovir last filled:  06/09/21, #60 ?Esomeprazole last filled:  06/06/21, #90 ?Allopurinol last rx:  06/06/21, #90 ?Colchicine last filled:  05/09/21, #30 ?Last OV:  04/11/21, 3 mo f/u ?Next OV:  09/26/21, med refill ?

## 2021-09-19 NOTE — Telephone Encounter (Signed)
ERx refills x1 until she gets into office next week.  ?

## 2021-09-26 ENCOUNTER — Ambulatory Visit: Payer: Medicare Other | Admitting: Family Medicine

## 2021-09-29 ENCOUNTER — Ambulatory Visit: Payer: Medicare Other | Admitting: Family Medicine

## 2021-09-30 ENCOUNTER — Telehealth: Payer: Self-pay

## 2021-09-30 NOTE — Chronic Care Management (AMB) (Signed)
? ? ?Chronic Care Management ?Pharmacy Assistant  ? ?Name: Kaylee [name]  MRN: [id number] DOB: May 29, 1961 ? ? ?Reason for Encounter: Reminder Call ?  ?Conditions to be addressed/monitored: ?HLD and COPD ? ?Medications: ?Outpatient Encounter Medications as of 09/30/2021  ?Medication Sig Note  ? acetaminophen (TYLENOL) 500 MG tablet Take 1,000 mg by mouth every 6 (six) hours as needed for mild pain.   ? acyclovir (ZOVIRAX) 400 MG tablet TAKE 1 TABLET BY MOUTH TWICE A DAY   ? ADVAIR DISKUS 250-50 MCG/ACT AEPB INHALE 1 PUFF INTO THE LUNGS TWICE DAILY   ? albuterol (VENTOLIN HFA) 108 (90 Base) MCG/ACT inhaler INHALE 2 PUFFS INTO THE LUNGS EVERY 4 HOURS AS NEEDED. (Patient taking differently: Inhale 2 puffs into the lungs every 4 (four) hours as needed for wheezing or shortness of breath. INHALE 2 PUFFS INTO THE LUNGS EVERY 4 HOURS AS NEEDED)   ? allopurinol (ZYLOPRIM) 100 MG tablet TAKE 1 TABLET BY MOUTH DAILY   ? ALPRAZolam (XANAX) 1 MG tablet TAKE 1 TABLET BY MOUTH DAILY AS NEEDED FOR ANXIETY   ? ARIPiprazole (ABILIFY) 20 MG tablet Take 1 tablet (20 mg total) by mouth daily.   ? atorvastatin (LIPITOR) 10 MG tablet TAKE 1 TABLET BY MOUTH DAILY (Patient taking differently: Take 10 mg by mouth daily.)   ? citalopram (CELEXA) 10 MG tablet TAKE 1 TABLET BY MOUTH DAILY (Patient taking differently: Take 10 mg by mouth daily.)   ? colchicine 0.6 MG tablet TAKE 1 TABLET BY MOUTH DAILY AS NEEDED   ? diazepam (VALIUM) 10 MG tablet TAKE 1 TABLET BY MOUTH TWICE A DAY   ? dicyclomine (BENTYL) 10 MG capsule TAKE 1 CAPSULE BY MOUTH 3 TIMES A DAY ASNEEDED FOR SPASMS. TAKE MEDICATION WITH MEALS.   ? diphenhydrAMINE (BENADRYL) 25 mg capsule Take 50 mg by mouth every 6 (six) hours as needed for sleep.   ? esomeprazole (NEXIUM) 40 MG capsule TAKE 1 CAPSULE BY MOUTH DAILY   ? etodolac (LODINE) 400 MG tablet TAKE 1 TABLET BY MOUTH TWICE A DAY AS NEEDED FOR MODERATE PAIN. (Patient taking differently: Take 400 mg by mouth 2 (two) times  daily.)   ? fluticasone (FLONASE) 50 MCG/ACT nasal spray Place 2 sprays into both nostrils daily.   ? furosemide (LASIX) 40 MG tablet TAKE 1 TABLET BY MOUTH DAILY AS NEEDED FOR FLUID   ? linaclotide (LINZESS) 290 MCG CAPS capsule Take 1 capsule (290 mcg total) by mouth daily before breakfast.   ? methocarbamol (ROBAXIN) 750 MG tablet TAKE 1 TABLET BY MOUTH TWICE A DAY AS NEEDED FOR MUSCLE SPASMS   ? MISC NATURAL PRODUCTS PO Take by mouth. Keto diet supplement pills   ? mupirocin ointment (BACTROBAN) 2 % Apply twice daily to open wounds   ? NEOMYCIN-POLYMYXIN-HYDROCORTISONE (CORTISPORIN) 1 % SOLN OTIC solution PLACE 3 DROPS INTO THE LEFT EAR 3 TIMES A DAY AS DIRECTED.   ? nitrofurantoin (MACRODANTIN) 100 MG capsule TAKE 1 CAPSULE BY MOUTH DAILY   ? omega-3 acid ethyl esters (LOVAZA) 1 g capsule Take 2 capsules (2 g total) by mouth daily.   ? oxyCODONE-acetaminophen (PERCOCET) 7.5-325 MG tablet TAKE 1 TABLET BY MOUTH EVERY 8 HOURS AS NEEDED FOR PAIN   ? potassium chloride (KLOR-CON) 10 MEQ tablet TAKE 1 TABLET BY MOUTH DAILY (Patient taking differently: Take 10 mEq by mouth daily.) 01/16/2021: Patient takes as needed with Furosemide  ? promethazine (PHENERGAN) 12.5 MG tablet Take 1 tablet (12.5 mg total) by mouth every 8 (  eight) hours as needed for nausea.   ? senna (SENOKOT) 8.6 MG TABS tablet Take 1 tablet (8.6 mg total) by mouth 2 (two) times daily.   ? SPIRIVA HANDIHALER 18 MCG inhalation capsule INHALE ONE CAPSULE AS DIRECTED ONCE A DAY   ? trimethoprim-polymyxin b (POLYTRIM) ophthalmic solution PLACE 1 DROP INTO THE LEFT EYE EVERY 6 HOURS   ? ?No facility-administered encounter medications on file as of 09/30/2021.  ? ?[place] A Fontanilla was contacted to remind of upcoming telephone visit with Al CorpusLindsey Foltanski on 10/03/21 at 8:45am. Patient was reminded to have any blood glucose and blood pressure readings available for review at appointment.  ? ?Message was left reminding patient of appointment.  Mailbox  ful ? ? ?Star Rating Drugs: ?Medication:  Last Fill: Day Supply ?Atorvastatin 10mg  09/18/21  30 ? ?Al CorpusLindsey Foltanski, CPP notified ? ?Vernesha Talbot, CCMA ?Health concierge  ?[phone]  ? ?  ?

## 2021-10-03 ENCOUNTER — Other Ambulatory Visit: Payer: Self-pay

## 2021-10-03 ENCOUNTER — Ambulatory Visit (INDEPENDENT_AMBULATORY_CARE_PROVIDER_SITE_OTHER): Payer: Medicare Other | Admitting: Pharmacist

## 2021-10-03 ENCOUNTER — Ambulatory Visit: Payer: Medicare Other | Admitting: Family Medicine

## 2021-10-03 DIAGNOSIS — E785 Hyperlipidemia, unspecified: Secondary | ICD-10-CM

## 2021-10-03 DIAGNOSIS — F319 Bipolar disorder, unspecified: Secondary | ICD-10-CM

## 2021-10-03 DIAGNOSIS — J432 Centrilobular emphysema: Secondary | ICD-10-CM

## 2021-10-03 DIAGNOSIS — G894 Chronic pain syndrome: Secondary | ICD-10-CM

## 2021-10-03 NOTE — Telephone Encounter (Signed)
Pt cancelled today's appt. She's cancelled and/or no showed multiple appts in the last 6 months. I last saw her 04/11/2022. Overdue for chronic pain f/u visit. She will need to keep April appt if we are to continue prescribing controlled substances.  ?

## 2021-10-03 NOTE — Patient Instructions (Signed)
Visit Information ? ?Phone number for Pharmacist: [phone] ? ? Goals Addressed   ?None ?  ? ? ?Care Plan : CCM Pharmacy Care Plan  ?Updates made by Kathyrn Sheriff, RPH since 10/03/2021 12:00 AM  ?  ? ?Problem: Hyperlipidemia and COPD, Bipolar 1 disorder, Chronic pain   ?Priority: High  ?  ? ?Long-Range Goal: Disease mgmt   ?Start Date: 10/03/2021  ?Expected End Date: 10/04/2022  ?This Visit's Progress: On track  ?Priority: High  ?Note:   ?Current Barriers:  ?Unable to independently monitor therapeutic efficacy ?Does not keep psychiatry appts ? ?Pharmacist Clinical Goal(s):  ?Patient will achieve adherence to monitoring guidelines and medication adherence to achieve therapeutic efficacy ?contact provider office for questions/concerns as evidenced notation of same in electronic health record through collaboration with PharmD and provider.  ? ?Interventions: ?1:1 collaboration with [name], MD regarding development and update of comprehensive plan of care as evidenced by provider attestation and co-signature ?Inter-disciplinary care team collaboration (see longitudinal plan of care) ?Comprehensive medication review performed; medication list updated in electronic medical record ? ?Hyperlipidemia: (LDL goal < 100) ?-Controlled - LDL at goal; adherence affirmed ?-Current treatment: ?Atorvastatin 10 mg daily - Appropriate, Effective, Safe, Accessible ?Lovaza 1 g - 2 cap daily -Appropriate, Effective, Safe, Accessible ?-Medications previously tried: n/a  ?-Educated on Cholesterol goals;  ?-Recommended to continue current medication ? ?COPD (Goal: control symptoms and prevent exacerbations) ?-Controlled - per pt report ?-Current treatment  ?Advair Diskus 250-50 mcg/act 1 puff BID -Appropriate, Effective, Safe, Accessible ?Albuterol HFA prn -Appropriate, Effective, Safe, Accessible ?Spiriva Handihaler 18 mcg 1 puff daily -Appropriate, Effective, Safe, Accessible ?-Medications previously tried: n/a   ?-Pulmonary function testing: not on file ?-Exacerbations requiring treatment in last 6 months: 0 ?-Patient reports consistent use of maintenance inhaler ?-Counseled on Benefits of consistent maintenance inhaler use ?-Recommended to continue current medication ? ?Bipolar 1 Disorder (Goal: manage symptoms) ?-Query controlled - per pt report, she is doing well on current meds and denies need for changes; per chart review, pt has ongoing issues with delusions of parasitosis and when pt answered the phone today she reports "I have my head in the sink" due to this; of note, she is taking lower dose of Abilify than prescribed as pharmacy has been filling 10 mg dose, not 20 mg ?-PHQ9: 0 (05/2020) - minimal depression ?-GAD7: 4 (05/2020) - minimal anxiety ?-Connected with PCP for mental health support; previously Dr Lolly Mustache (last 2018); pt has been referred to psychiatry multiple times and has failed to keep appts (last known Nov 2022) despite multiple reminders from staff ?-Current treatment: ?Alprazolam 1 mg PRN - Appropriate, Effective, Safe, Accessible ?Diazepam 10 mg BID -Appropriate, Effective, Safe, Accessible ?Aripiprazole 20 mg daily - pharmacy is filling 10 mg tablet -Appropriate, Query Effective ?Citalopram 10 mg daily -Appropriate, Effective, Safe, Accessible ?-Medications previously tried/failed: n/a ?-Educated on Benefits of medication for symptom control ?-Recommend follow up with psychiatry, unclear if another referral is needed. Consulting with PCP ? ?Chronic pain (Goal: manage pain) ?-Controlled ?-Current treatment  ?Oxycodone-APAP 7.5-325 mg TID #90 -Appropriate, Effective, Safe, Accessible ?Etodolac 400 mg BID prn -Appropriate, Effective, Safe, Accessible ?Methocarbamol 750 mg BID prn-Appropriate, Effective, Safe, Accessible ?Tylenol 500 mg PRN-Appropriate, Effective, Safe, Accessible ?-Medications previously tried: n/a  ?-Recommended to continue current medication ? ?Patient Goals/Self-Care  Activities ?Patient will:  ?- take medications as prescribed as evidenced by patient report and record review ?focus on medication adherence by routine ?Contact provider office for questions/concerns ?  ?  ? ?  The patient verbalized understanding of instructions, educational materials, and care plan provided today and declined offer to receive copy of patient instructions, educational materials, and care plan.  ?Telephone follow up appointment with pharmacy team member scheduled for: 3 months ? ?Al Corpus, PharmD, BCACP ?Clinical Pharmacist ?[facility] ?[phone] ?  ?

## 2021-10-03 NOTE — Telephone Encounter (Signed)
Spoke with pt relaying Dr. Timoteo Expose message.  Pt verbalizes understanding and states she will keep 10/15/21 OV.  Wants to make Dr. Reece Agar aware that her attorney had a heart attack affecting her court date so she had to c/x OV.  ?
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
## 2021-10-04 DIAGNOSIS — L03116 Cellulitis of left lower limb: Secondary | ICD-10-CM | POA: Diagnosis not present

## 2021-10-10 DIAGNOSIS — F3189 Other bipolar disorder: Secondary | ICD-10-CM

## 2021-10-10 DIAGNOSIS — E785 Hyperlipidemia, unspecified: Secondary | ICD-10-CM | POA: Diagnosis not present

## 2021-10-10 DIAGNOSIS — J449 Chronic obstructive pulmonary disease, unspecified: Secondary | ICD-10-CM

## 2021-10-14 ENCOUNTER — Other Ambulatory Visit: Payer: Self-pay | Admitting: Family Medicine

## 2021-10-15 ENCOUNTER — Encounter: Payer: Self-pay | Admitting: Emergency Medicine

## 2021-10-15 ENCOUNTER — Other Ambulatory Visit: Payer: Self-pay

## 2021-10-15 ENCOUNTER — Emergency Department
Admission: EM | Admit: 2021-10-15 | Discharge: 2021-10-15 | Disposition: A | Discharge status: Home / Self Care | Payer: Medicare Other | Attending: Emergency Medicine | Admitting: Emergency Medicine

## 2021-10-15 ENCOUNTER — Ambulatory Visit: Payer: Medicare Other | Admitting: Family Medicine

## 2021-10-15 ENCOUNTER — Emergency Department: Payer: Medicare Other

## 2021-10-15 DIAGNOSIS — E872 Acidosis, unspecified: Secondary | ICD-10-CM | POA: Diagnosis not present

## 2021-10-15 DIAGNOSIS — M7989 Other specified soft tissue disorders: Secondary | ICD-10-CM | POA: Diagnosis not present

## 2021-10-15 DIAGNOSIS — L03116 Cellulitis of left lower limb: Secondary | ICD-10-CM | POA: Insufficient documentation

## 2021-10-15 LAB — CBC WITH DIFFERENTIAL/PLATELET
Abs Immature Granulocytes: 0.01 10*3/uL (ref 0.00–0.07)
Basophils Absolute: 0 10*3/uL (ref 0.0–0.1)
Basophils Relative: 1 %
Eosinophils Absolute: 0.2 10*3/uL (ref 0.0–0.5)
Eosinophils Relative: 3 %
HCT: 32.5 % — ABNORMAL LOW (ref 36.0–46.0)
Hemoglobin: 10.3 g/dL — ABNORMAL LOW (ref 12.0–15.0)
Immature Granulocytes: 0 %
Lymphocytes Relative: 46 %
Lymphs Abs: 3.1 10*3/uL (ref 0.7–4.0)
MCH: 27.6 pg (ref 26.0–34.0)
MCHC: 31.7 g/dL (ref 30.0–36.0)
MCV: 87.1 fL (ref 80.0–100.0)
Monocytes Absolute: 0.6 10*3/uL (ref 0.1–1.0)
Monocytes Relative: 8 %
Neutro Abs: 2.9 10*3/uL (ref 1.7–7.7)
Neutrophils Relative %: 42 %
Platelets: 291 10*3/uL (ref 150–400)
RBC: 3.73 MIL/uL — ABNORMAL LOW (ref 3.87–5.11)
RDW: 15.1 % (ref 11.5–15.5)
WBC: 6.8 10*3/uL (ref 4.0–10.5)
nRBC: 0 % (ref 0.0–0.2)

## 2021-10-15 LAB — COMPREHENSIVE METABOLIC PANEL
ALT: 11 U/L (ref 0–44)
AST: 16 U/L (ref 15–41)
Albumin: 3.1 g/dL — ABNORMAL LOW (ref 3.5–5.0)
Alkaline Phosphatase: 78 U/L (ref 38–126)
Anion gap: 7 (ref 5–15)
BUN: 17 mg/dL (ref 8–23)
CO2: 27 mmol/L (ref 22–32)
Calcium: 8.6 mg/dL — ABNORMAL LOW (ref 8.9–10.3)
Chloride: 105 mmol/L (ref 98–111)
Creatinine, Ser: 0.93 mg/dL (ref 0.44–1.00)
GFR, Estimated: >60 mL/min (ref >60)
Glucose, Bld: 124 mg/dL — ABNORMAL HIGH (ref 70–99)
Potassium: 2.8 mmol/L — ABNORMAL LOW (ref 3.5–5.1)
Sodium: 139 mmol/L (ref 135–145)
Total Bilirubin: 0.5 mg/dL (ref 0.3–1.2)
Total Protein: 6.5 g/dL (ref 6.5–8.1)

## 2021-10-15 LAB — LACTIC ACID, PLASMA
Lactic Acid, Venous: 2.3 mmol/L (ref 0.5–1.9)
Lactic Acid, Venous: 0.9 mmol/L (ref 0.5–1.9)

## 2021-10-15 MED ORDER — CEPHALEXIN 500 MG PO CAPS
500.0000 mg | ORAL_CAPSULE | Freq: Four times a day (QID) | ORAL | 0 refills | Status: AC
Start: 2021-10-15 — End: 2021-10-25

## 2021-10-15 MED ORDER — POTASSIUM CHLORIDE CRYS ER 20 MEQ PO TBCR
40.0000 meq | EXTENDED_RELEASE_TABLET | Freq: Once | ORAL | Status: AC
  Administered 2021-10-15: 40 meq via ORAL
  Filled 2021-10-15: qty 2

## 2021-10-15 MED ORDER — VANCOMYCIN HCL IN DEXTROSE 1-5 GM/200ML-% IV SOLN
1000.0000 mg | Freq: Once | INTRAVENOUS | Status: AC
  Administered 2021-10-15: 1000 mg via INTRAVENOUS
  Filled 2021-10-15: qty 200

## 2021-10-15 NOTE — Discharge Instructions (Signed)
As we discussed it was recommended that you be admitted to the hospital for IV antibiotics. Please return for further evaluation if you are able. Please seek medical attention for any high fevers, chest pain, shortness of breath, change in behavior, persistent vomiting, bloody stool or any other new or concerning symptoms. ? ?

## 2021-10-15 NOTE — ED Notes (Signed)
Dr Derrill KayGoodman made aware of critical lactic acid 2.3 ?

## 2021-10-15 NOTE — ED Triage Notes (Signed)
Pt comes into the ED via POV c/o cellulitis in the left foot that is now weeping.  Pt states she has a h/o MRSA and being septic from it.  Pt ambulatory to triage at this time with even and unlabored respirations.  Pt presents with her leg wrapped at this time.  ?

## 2021-10-15 NOTE — Telephone Encounter (Signed)
Name of Medication: Alprazolam, Diazepam, Oxycodone-APAP ?Name of Pharmacy: [facility] ?Last Fill or Written Date and Quantity: 09/18/21 ?     Alprazolam- #10 ?     Diazepam- #60 ?     Oxycodone-APAP- #90 ?Last Office Visit and Type: 04/11/21; pt r/s today's OV for 10/28/21 ?Next Office Visit and Type: 10/28/21, med refill ?Last Controlled Substance Agreement Date: 09/06/20 ?Last UDS: 09/06/20 ? ?Potassium last filled:  09/18/21, #90 ?Robaxin last filled:  09/18/21, #60 ?Linzess last filled:  09/18/21, #30 ?Furosemide last filled:  09/18/21, #90 ?Acyclovir last filled: 09/19/21, #60 ?Colchicine last filled: 09/19/21, #30 ? ? ?

## 2021-10-15 NOTE — ED Notes (Signed)
Pt states she can not stay tonight she has a sick family member she needs to check on but she will come back tomorrow to get more IV antibiotics ? ?

## 2021-10-15 NOTE — ED Notes (Signed)
Dc instructions and scripts reviewed with pt no questions or concerns at this time. Will take script as prescribed and return or follow up as needed.  ?

## 2021-10-15 NOTE — ED Provider Notes (Signed)
? ?[facility] ?Provider Note ? ? ? Event Date/Time  ? First MD Initiated Contact with Patient 10/15/21 1845   ?  (approximate) ? ? ?History  ? ?Cellulitis ? ? ?HPI ? ?Kaylee [name] is a 61 y.o. female  who, per outside hospital documentation was seen on 09/09/21 for cellulitis of the left ring finger who has history fo COD, who presents to the emergency department today because of concern for left leg infection. The patient states that she has been having trouble with that leg for quite some time after a surgery. However she says for the past three days she has had increased redness and swelling. She has noticed discharge from the foot. The patient denies any fevers.  ? ? ?Physical Exam  ? ?Triage Vital Signs: ?ED Triage Vitals  ?Enc Vitals Group  ?   BP 10/15/21 1758 (!) 148/66  ?   Pulse Rate 10/15/21 1758 100  ?   Resp 10/15/21 1758 17  ?   Temp 10/15/21 1758 98.5 ?F (36.9 ?C)  ?   Temp Source 10/15/21 1758 Oral  ?   SpO2 10/15/21 1758 99 %  ?   Weight 10/15/21 1749 175 lb 0.7 oz (79.4 kg)  ?   Height 10/15/21 1749  (1.651 m)  ?   Head Circumference --   ?   Peak Flow --   ?   Pain Score 10/15/21 1749 6  ? ?Most recent vital signs: ?Vitals:  ? 10/15/21 1758  ?BP: (!) 148/66  ?Pulse: 100  ?Resp: 17  ?Temp: 98.5 ?F (36.9 ?C)  ?SpO2: 99%  ? ? ?General: Awake, no distress.  ?CV:  Good peripheral perfusion.  ?Resp:  Normal effort.  ?Abd:  No distention.  ?Other:  Left lower leg/ankle with swelling, erythema, wound to medial aspect of lower leg. ? ? ?ED Results / Procedures / Treatments  ? ?Labs ?(all labs ordered are listed, but only abnormal results are displayed) ?Labs Reviewed  ?LACTIC ACID, PLASMA - Abnormal; Notable for the following components:  ?    Result Value  ? Lactic Acid, Venous 2.3 (*)   ? All other components within normal limits  ?COMPREHENSIVE METABOLIC PANEL - Abnormal; Notable for the following components:  ? Potassium 2.8 (*)   ? Glucose, Bld 124 (*)   ? Calcium 8.6  (*)   ? Albumin 3.1 (*)   ? All other components within normal limits  ?CBC WITH DIFFERENTIAL/PLATELET - Abnormal; Notable for the following components:  ? RBC 3.73 (*)   ? Hemoglobin 10.3 (*)   ? HCT 32.5 (*)   ? All other components within normal limits  ?LACTIC ACID, PLASMA  ?URINALYSIS, ROUTINE W REFLEX MICROSCOPIC  ? ? ? ?EKG ? ?None ? ? ?RADIOLOGY ?I independently interpreted and visualized the left tib fib. My interpretation: no acute osseous abnormality ?Radiology interpretation:  ?IMPRESSION:  ?No acute bony abnormalities. No radiographic evidence of  ?osteomyelitis.  ?   ? ? ? ?PROCEDURES: ? ?Critical Care performed: No ? ?Procedures ? ? ?MEDICATIONS ORDERED IN ED: ?Medications - No data to display ? ? ?IMPRESSION / MDM / ASSESSMENT AND PLAN / ED COURSE  ?I reviewed the triage vital signs and the nursing notes. ?             ?               ? ?Differential diagnosis includes, but is not limited to, cellulitis, osteomyelitis, abscess. ? ?Patient presented  to the emergency department today because of concerns for left leg swelling and redness.  On exam patient does have a wound to the medial aspect of her left lower leg.  There is surrounding erythema and swelling.  I did obtain an x-ray to evaluate for any underlying osteomyelitis.  The bone did appear normal.  Patient's lactic acid level was slightly elevated.  Patient was given dose of IV antibiotics here.  I did recommend to patient that she be admitted to the hospital although she is adamant at this time that she does not want to be admitted.  We did discuss worsening infection.  She states she can return tomorrow.  I did encourage patient to return for further evaluation and treatment. Again reiterated recommendation for admission. ? ? ? ?FINAL CLINICAL IMPRESSION(S) / ED DIAGNOSES  ? ?Final diagnoses:  ?Cellulitis of left lower extremity  ? ? ? ? ? ? ?Note:  This document was prepared using Dragon voice recognition software and may include  unintentional dictation errors. ? ?  ?Phineas Semen, MD ?10/15/21 2133 ? ?

## 2021-10-15 NOTE — Telephone Encounter (Signed)
Patient has appointment with provider refills have been pended in office note for review.  ?

## 2021-10-16 NOTE — Telephone Encounter (Signed)
Patient has missed and late cancelled multiple appointments including this week.  ?I will refill 1 month supply of medications including controlled substances. ?If she misses appt in 2 wks I will start tapering her off controlled substances as I can't keep filling without seeing her.  ?Plz notify patient.  ?

## 2021-10-20 NOTE — Telephone Encounter (Signed)
Patient notified as instructed by telephone and verbalized understanding. Patient stated the last time that she cancelled her appointment she was in the ER. Patient stated that she will be at her upcoming appointment. ?

## 2021-10-28 ENCOUNTER — Ambulatory Visit (INDEPENDENT_AMBULATORY_CARE_PROVIDER_SITE_OTHER): Payer: Medicare Other | Admitting: Family Medicine

## 2021-10-28 ENCOUNTER — Encounter: Payer: Self-pay | Admitting: Family Medicine

## 2021-10-28 VITALS — BP 136/78 | HR 75 | Temp 98.7°F | Ht 65.0 in | Wt 173.5 lb

## 2021-10-28 DIAGNOSIS — M1990 Unspecified osteoarthritis, unspecified site: Secondary | ICD-10-CM

## 2021-10-28 DIAGNOSIS — Z599 Problem related to housing and economic circumstances, unspecified: Secondary | ICD-10-CM | POA: Diagnosis not present

## 2021-10-28 DIAGNOSIS — F319 Bipolar disorder, unspecified: Secondary | ICD-10-CM

## 2021-10-28 DIAGNOSIS — G8929 Other chronic pain: Secondary | ICD-10-CM | POA: Diagnosis not present

## 2021-10-28 DIAGNOSIS — F411 Generalized anxiety disorder: Secondary | ICD-10-CM | POA: Diagnosis not present

## 2021-10-28 DIAGNOSIS — E663 Overweight: Secondary | ICD-10-CM

## 2021-10-28 DIAGNOSIS — F22 Delusional disorders: Secondary | ICD-10-CM

## 2021-10-28 DIAGNOSIS — S81802S Unspecified open wound, left lower leg, sequela: Secondary | ICD-10-CM | POA: Diagnosis not present

## 2021-10-28 DIAGNOSIS — M0600 Rheumatoid arthritis without rheumatoid factor, unspecified site: Secondary | ICD-10-CM

## 2021-10-28 LAB — BASIC METABOLIC PANEL
BUN: 19 mg/dL (ref 6–23)
CO2: 33 mEq/L — ABNORMAL HIGH (ref 19–32)
Calcium: 9.1 mg/dL (ref 8.4–10.5)
Chloride: 99 mEq/L (ref 96–112)
Creatinine, Ser: 0.84 mg/dL (ref 0.40–1.20)
GFR: 74.71 mL/min (ref >60.00)
Glucose, Bld: 80 mg/dL (ref 70–99)
Potassium: 4.2 mEq/L (ref 3.5–5.1)
Sodium: 135 mEq/L (ref 135–145)

## 2021-10-28 LAB — CBC WITH DIFFERENTIAL/PLATELET
Basophils Absolute: 0 10*3/uL (ref 0.0–0.1)
Basophils Relative: 0.5 % (ref 0.0–3.0)
Eosinophils Absolute: 0.3 10*3/uL (ref 0.0–0.7)
Eosinophils Relative: 3 % (ref 0.0–5.0)
HCT: 37.9 % (ref 36.0–46.0)
Hemoglobin: 12.3 g/dL (ref 12.0–15.0)
Lymphocytes Relative: 48.5 % — ABNORMAL HIGH (ref 12.0–46.0)
Lymphs Abs: 4.2 10*3/uL — ABNORMAL HIGH (ref 0.7–4.0)
MCHC: 32.4 g/dL (ref 30.0–36.0)
MCV: 85.8 fl (ref 78.0–100.0)
Monocytes Absolute: 0.5 10*3/uL (ref 0.1–1.0)
Monocytes Relative: 6.2 % (ref 3.0–12.0)
Neutro Abs: 3.7 10*3/uL (ref 1.4–7.7)
Neutrophils Relative %: 41.8 % — ABNORMAL LOW (ref 43.0–77.0)
Platelets: 254 10*3/uL (ref 150.0–400.0)
RBC: 4.42 Mil/uL (ref 3.87–5.11)
RDW: 16.4 % — ABNORMAL HIGH (ref 11.5–15.5)
WBC: 8.8 10*3/uL (ref 4.0–10.5)

## 2021-10-28 MED ORDER — ARIPIPRAZOLE 20 MG PO TABS: 20.0000 mg | ORAL_TABLET | Freq: Every day | ORAL | 11 refills | Status: DC

## 2021-10-28 NOTE — Progress Notes (Signed)
? ? Patient ID: Kaylee [name], female    DOB: 1961/01/18, 61 y.o.   MRN: [id number] ? ?This visit was conducted in person. ? ?BP 136/78   Pulse 75   Temp 98.7 ?F (37.1 ?C) (Temporal)   Ht  (1.651 m)   Wt 173 lb 8 oz (78.7 kg)   SpO2 98%   BMI 28.87 kg/m?   ? ?CC: follow up visit  ?Subjective:  ? ?HPI: ?Kaylee [name] is a 61 y.o. female presenting on 10/28/2021 for Follow-up (Here for f/u.) ? ? ?Complicated period since last seen 03/2021. Had late cancelled multiple appts in interim, presents today for chronic pain f/u visit.  ?Prolonged hospitalization 01/2021 with LLE necrotizing fasciitis infection with sepsis s/p multiple debridements and wound vac placements in the OR, in setting of delusional parasitosis with trichotillomania since 03/2020.  ?Latest hospitalization 04/2021 - abilify increased to  per psychiatry at that time. She has continued taking abilify  only. Has refused psychiatric care, including missing scheduled appt 05/2021.  ? ?Broke up with BF - currently staying with children and cousins alternatingly. Denies food insecurity. Agrees to speak with care coordinator to get plugged into local resources.  ? ?No recent fevers/chills.  ?She continues daily dressing changes.  ? ?Chronic pain - continues oxycodone 7.5/325mg  #90/month - she states she's consistently taking twice daily.  ? ?She continues celexa , valium daily with xanax PRN.  ?She keeps controlled substances in a locked box.  ? ?Most recently saw  ER 10/2021 for concern for recurrent cellulitis of LLE - treated with IV vancomycin x1 then Keflex  QID 10d course. Was recommended hospitalization at that time, refused.  ? ?Last saw plastic surgery 08/2021 - recommended continued VVS f/u and wound care- this has not been done. Has not followed up with general surgery or psychiatry as recommended.  ? ?Last saw gen surgery 06/2021 - referred to vascular surgery. States vascular surgery did not recommend  intervention until wound was fully closed. Recommended compression wraps - she states she is using these regularly.  ? ?She continues abilify  daily - despite increased dose to  last refill.  ?   ? ?Relevant past medical, surgical, family and social history reviewed and updated as indicated. Interim medical history since our last visit reviewed. ?Allergies and medications reviewed and updated. ?Outpatient Medications Prior to Visit  ?Medication Sig Dispense Refill  ? acetaminophen (TYLENOL) 500 MG tablet Take 1,000 mg by mouth every 6 (six) hours as needed for mild pain.    ? acyclovir (ZOVIRAX) 400 MG tablet TAKE 1 TABLET BY MOUTH TWICE A DAY 60 tablet 0  ? ADVAIR DISKUS 250-50 MCG/ACT AEPB INHALE 1 PUFF INTO THE LUNGS TWICE DAILY 60 each 3  ? albuterol (VENTOLIN HFA) 108 (90 Base) MCG/ACT inhaler INHALE 2 PUFFS INTO THE LUNGS EVERY 4 HOURS AS NEEDED. (Patient taking differently: Inhale 2 puffs into the lungs every 4 (four) hours as needed for wheezing or shortness of breath. INHALE 2 PUFFS INTO THE LUNGS EVERY 4 HOURS AS NEEDED) 18 g 3  ? allopurinol (ZYLOPRIM) 100 MG tablet TAKE 1 TABLET BY MOUTH DAILY 90 tablet 0  ? ALPRAZolam (XANAX) 1 MG tablet TAKE 1 TABLET BY MOUTH DAILY AS NEEDED FOR ANXIETY 5 tablet 0  ? atorvastatin (LIPITOR) 10 MG tablet TAKE 1 TABLET BY MOUTH DAILY (Patient taking differently: Take 10 mg by mouth daily.) 90 tablet 1  ? cephALEXin (KEFLEX) 500 MG capsule Take 500 mg by mouth 4 (  four) times daily. For 10 days    ? citalopram (CELEXA) 10 MG tablet TAKE 1 TABLET BY MOUTH DAILY (Patient taking differently: Take 10 mg by mouth daily.) 90 tablet 3  ? colchicine 0.6 MG tablet TAKE 1 TABLET BY MOUTH DAILY AS NEEDED 30 tablet 0  ? diazepam (VALIUM) 10 MG tablet TAKE 1 TABLET BY MOUTH TWICE A DAY 60 tablet 0  ? dicyclomine (BENTYL) 10 MG capsule TAKE 1 CAPSULE BY MOUTH 3 TIMES A DAY ASNEEDED FOR SPASMS. TAKE MEDICATION WITH MEALS. 60 capsule 3  ? diphenhydrAMINE (BENADRYL) 25 mg capsule  Take 50 mg by mouth every 6 (six) hours as needed for sleep.    ? esomeprazole (NEXIUM) 40 MG capsule TAKE 1 CAPSULE BY MOUTH DAILY 90 capsule 0  ? etodolac (LODINE) 400 MG tablet TAKE 1 TABLET BY MOUTH TWICE A DAY AS NEEDED FOR MODERATE PAIN. (Patient taking differently: Take 400 mg by mouth 2 (two) times daily.) 60 tablet 11  ? fluticasone (FLONASE) 50 MCG/ACT nasal spray Place 2 sprays into both nostrils daily. 16 g 11  ? furosemide (LASIX) 40 MG tablet TAKE 1 TABLET BY MOUTH DAILY AS NEEDED FOR FLUID 90 tablet 0  ? linaclotide (LINZESS) 290 MCG CAPS capsule TAKE 1 CAPSULE BY MOUTH DAILY BEFORE BREAKFAST 30 capsule 0  ? methocarbamol (ROBAXIN) 750 MG tablet TAKE 1 TABLET BY MOUTH TWICE A DAY AS NEEDED FOR MUSCLE SPASMS 60 tablet 0  ? MISC NATURAL PRODUCTS PO Take by mouth. Keto diet supplement pills    ? nitrofurantoin (MACRODANTIN) 100 MG capsule TAKE 1 CAPSULE BY MOUTH DAILY 90 capsule 0  ? omega-3 acid ethyl esters (LOVAZA) 1 g capsule Take 2 capsules (2 g total) by mouth daily. 180 capsule 3  ? oxyCODONE-acetaminophen (PERCOCET) 7.5-325 MG tablet TAKE 1 TABLET BY MOUTH EVERY 8 HOURS AS NEEDED FOR PAIN 90 tablet 0  ? potassium chloride (KLOR-CON) 10 MEQ tablet TAKE 1 TABLET BY MOUTH DAILY 90 tablet 0  ? senna (SENOKOT) 8.6 MG TABS tablet Take 1 tablet (8.6 mg total) by mouth 2 (two) times daily. 120 tablet 0  ? SPIRIVA HANDIHALER 18 MCG inhalation capsule INHALE ONE CAPSULE AS DIRECTED ONCE A DAY 90 capsule 0  ? ARIPiprazole (ABILIFY) 20 MG tablet Take 1 tablet (20 mg total) by mouth daily. (Patient taking differently: Take 10 mg by mouth daily.) 30 tablet 0  ? ?No facility-administered medications prior to visit.  ?  ? ?Per HPI unless specifically indicated in ROS section below ?Review of Systems ? ?Objective:  ?BP 136/78   Pulse 75   Temp 98.7 ?F (37.1 ?C) (Temporal)   Ht  (1.651 m)   Wt 173 lb 8 oz (78.7 kg)   SpO2 98%   BMI 28.87 kg/m?   ?Wt Readings from Last 3 Encounters:  ?10/28/21 173 lb 8  oz (78.7 kg)  ?10/15/21 175 lb 0.7 oz (79.4 kg)  ?05/08/21 175 lb (79.4 kg)  ?  ?  ?Physical Exam ?Vitals and nursing note reviewed.  ?Constitutional:   ?   Appearance: Normal appearance. She is not ill-appearing.  ?Cardiovascular:  ?   Rate and Rhythm: Normal rate and regular rhythm.  ?   Pulses: Normal pulses.  ?   Heart sounds: Normal heart sounds. No murmur heard. ?Pulmonary:  ?   Effort: Pulmonary effort is normal. No respiratory distress.  ?   Breath sounds: Normal breath sounds. No wheezing, rhonchi or rales.  ?Musculoskeletal:  ?   Comments:  Residual scar to medial LLE - see below  ?Skin: ?   General: Skin is warm and dry.  ?   Findings: Wound present. No erythema.  ? ?    ?   Comments: 8.5cmx3cm shallow ulcer to lower L medial leg   ?Neurological:  ?   Mental Status: She is alert.  ?Psychiatric:     ?   Mood and Affect: Mood normal.     ?   Behavior: Behavior normal.  ? ?   ?Results for orders placed or performed in visit on 10/28/21  ?Basic metabolic panel  ?Result Value Ref Range  ? Sodium 135 135 - 145 mEq/L  ? Potassium 4.2 3.5 - 5.1 mEq/L  ? Chloride 99 96 - 112 mEq/L  ? CO2 33 (H) 19 - 32 mEq/L  ? Glucose, Bld 80 70 - 99 mg/dL  ? BUN 19 6 - 23 mg/dL  ? Creatinine, Ser 0.84 0.40 - 1.20 mg/dL  ? GFR 74.71 >60.00 mL/min  ? Calcium 9.1 8.4 - 10.5 mg/dL  ?CBC with Differential/Platelet  ?Result Value Ref Range  ? WBC 8.8 4.0 - 10.5 K/uL  ? RBC 4.42 3.87 - 5.11 Mil/uL  ? Hemoglobin 12.3 12.0 - 15.0 g/dL  ? HCT 37.9 36.0 - 46.0 %  ? MCV 85.8 78.0 - 100.0 fl  ? MCHC 32.4 30.0 - 36.0 g/dL  ? RDW 16.4 (H) 11.5 - 15.5 %  ? Platelets 254.0 150.0 - 400.0 K/uL  ? Neutrophils Relative % 41.8 (L) 43.0 - 77.0 %  ? Lymphocytes Relative 48.5 (H) 12.0 - 46.0 %  ? Monocytes Relative 6.2 3.0 - 12.0 %  ? Eosinophils Relative 3.0 0.0 - 5.0 %  ? Basophils Relative 0.5 0.0 - 3.0 %  ? Neutro Abs 3.7 1.4 - 7.7 K/uL  ? Lymphs Abs 4.2 (H) 0.7 - 4.0 K/uL  ? Monocytes Absolute 0.5 0.1 - 1.0 K/uL  ? Eosinophils Absolute 0.3 0.0 - 0.7  K/uL  ? Basophils Absolute 0.0 0.0 - 0.1 K/uL  ? ? ?Assessment & Plan:  ?I spent 50 minutes caring for this patient today face to face, reviewing labs, prior records from another facility, performing a medically appropriate

## 2021-10-28 NOTE — Patient Instructions (Signed)
Labs today.  ?I will ask our care coordinator to reach out for local resources.  ?We will refill oxycodone 7.5/325mg  when next due.  ?Urine drug test today.  ?Take abilify 20mg  daily - new dose at pharmacy. Double up on what you have until you run out. Let us know if any trouble with medicine ?We will refer you to wound clinic ([address], [place], [place] [address]).  ?Call to schedule appointment with psychiatrist [phone]  ?

## 2021-10-28 NOTE — Assessment & Plan Note (Addendum)
[place] CSRS reviewed. ?Update UDS today.  ?She states she only takes oxycodone 7.5/325mg  BID although written TID - next refill we will change sig and provide lower # to reflect how she currently takes medications.  ?Notes worsening lower back pain - planning to return to see PM&R.  ?

## 2021-10-30 ENCOUNTER — Encounter: Payer: Self-pay | Admitting: Family Medicine

## 2021-10-30 NOTE — Assessment & Plan Note (Signed)
UDS today ?Increase abilify to 20mg  daily. ?New referral paced to psychiatry.  ?Consider pimozide.  ?

## 2021-10-30 NOTE — Assessment & Plan Note (Signed)
Persistent poorly healing wound despite what seems like Dukal bismuth/petroleum gauze regular use.  ?Wound redressed in office today.  ?Will refer to wound clinic.  ?

## 2021-10-30 NOTE — Assessment & Plan Note (Signed)
Homeless, living with children and cousins. Will ask care coordinator to reach out.  ?

## 2021-10-30 NOTE — Assessment & Plan Note (Signed)
Managed with celexa, valium, PRN xanax.  ?

## 2021-10-30 NOTE — Assessment & Plan Note (Addendum)
H/o inflammatory arthritis managed with allopurinol and colchicine - she finds these medications have helped her the most.  ?

## 2021-10-30 NOTE — Assessment & Plan Note (Addendum)
On abilifiy, celexa, benzo. ?Last saw psych 2018.  ?Will increase abilify to 20mg  daily per inpatient psych recommendations.  ?With new severe delusional parasitosis, did recommend psych eval - new referral placed. # also provided for her to call and schedule appointment.  ?

## 2021-10-30 NOTE — Assessment & Plan Note (Addendum)
Significant weight loss after prolonged hospitalization/illness 2022.  ?She endorses weight gain over the last few months.  ?

## 2021-10-30 NOTE — Assessment & Plan Note (Addendum)
She stopped plaquenil 2019 and has declined to return to rheum since.  ?

## 2021-10-31 LAB — DRUG MONITORING, PANEL 8 WITH CONFIRMATION, URINE
6 Acetylmorphine: NEGATIVE ng/mL (ref <10)
Alcohol Metabolites: NEGATIVE ng/mL (ref <500)
Alphahydroxyalprazolam: NEGATIVE ng/mL (ref <25)
Alphahydroxymidazolam: NEGATIVE ng/mL (ref <50)
Alphahydroxytriazolam: NEGATIVE ng/mL (ref <50)
Aminoclonazepam: NEGATIVE ng/mL (ref <25)
Amphetamine: NEGATIVE ng/mL (ref <250)
Amphetamines: POSITIVE ng/mL — AB (ref <500)
Benzodiazepines: POSITIVE ng/mL — AB (ref <100)
Buprenorphine, Urine: NEGATIVE ng/mL (ref <5)
Cocaine Metabolite: NEGATIVE ng/mL (ref <150)
Codeine: NEGATIVE ng/mL (ref <50)
Creatinine: 35.1 mg/dL (ref >=20.0)
Hydrocodone: NEGATIVE ng/mL (ref <50)
Hydromorphone: NEGATIVE ng/mL (ref <50)
Hydroxyethylflurazepam: NEGATIVE ng/mL (ref <50)
Lorazepam: NEGATIVE ng/mL (ref <50)
MDMA: NEGATIVE ng/mL (ref <500)
Marijuana Metabolite: NEGATIVE ng/mL (ref <20)
Methamphetamine: 516 ng/mL — ABNORMAL HIGH (ref <250)
Morphine: NEGATIVE ng/mL (ref <50)
Nordiazepam: 216 ng/mL — ABNORMAL HIGH (ref <50)
Norhydrocodone: NEGATIVE ng/mL (ref <50)
Noroxycodone: 1747 ng/mL — ABNORMAL HIGH (ref <50)
Opiates: NEGATIVE ng/mL (ref <100)
Oxazepam: 525 ng/mL — ABNORMAL HIGH (ref <50)
Oxidant: NEGATIVE ug/mL (ref <200)
Oxycodone: 1782 ng/mL — ABNORMAL HIGH (ref <50)
Oxycodone: POSITIVE ng/mL — AB (ref <100)
Oxymorphone: 394 ng/mL — ABNORMAL HIGH (ref <50)
Temazepam: 374 ng/mL — ABNORMAL HIGH (ref <50)
pH: 5.8 (ref 4.5–9.0)

## 2021-10-31 LAB — DM TEMPLATE

## 2021-11-03 ENCOUNTER — Encounter: Payer: Self-pay | Admitting: Family Medicine

## 2021-11-03 ENCOUNTER — Telehealth: Payer: Self-pay | Admitting: *Deleted

## 2021-11-03 DIAGNOSIS — F1510 Other stimulant abuse, uncomplicated: Secondary | ICD-10-CM | POA: Insufficient documentation

## 2021-11-03 DIAGNOSIS — R825 Elevated urine levels of drugs, medicaments and biological substances: Secondary | ICD-10-CM | POA: Insufficient documentation

## 2021-11-03 NOTE — Telephone Encounter (Signed)
? ?  Telephone encounter was:  Unsuccessful.  11/03/2021 ?Name: Kaylee [name] MRN: [id number] DOB: [birth year] ? ?Unsuccessful outbound call made today to assist with:  Food Insecurity and Housing ? ?Outreach Attempt:  1st Attempt ?Mailbox is full ? ?Kaylee [name] ?Care Guide , Embedded Care Coordination ?[place], Care Management  ?[phone] ?[address] [place] , [place] [address] ?Email : [email or web address] ?  ? ?

## 2021-11-04 DIAGNOSIS — L03116 Cellulitis of left lower limb: Secondary | ICD-10-CM | POA: Diagnosis not present

## 2021-11-10 ENCOUNTER — Other Ambulatory Visit: Payer: Self-pay | Admitting: Family Medicine

## 2021-11-10 ENCOUNTER — Telehealth: Payer: Self-pay | Admitting: Family Medicine

## 2021-11-10 NOTE — Telephone Encounter (Signed)
Pt returned call. Please call back.

## 2021-11-10 NOTE — Telephone Encounter (Signed)
Contacted the patient and rescheduled appointment to sooner date with CPP. ? ?Al Corpus, CPP notified ? ?Kuper Rennels, CCMA ?Health concierge  ?[phone]  ?

## 2021-11-10 NOTE — Telephone Encounter (Signed)
Pt is asking to reschedule her pharmacist F/U call on 01/02/2022 @8 :45 am. Please advise ? ?Callback Number: [phone] ?

## 2021-11-11 ENCOUNTER — Telehealth: Payer: Self-pay | Admitting: *Deleted

## 2021-11-11 NOTE — Chronic Care Management (AMB) (Signed)
?  Chronic Care Management  ? ?Note ? ?11/11/2021 ?Name: KARENNA ROMANOFF MRN: [id number] DOB: 02-15-1961 ? ?Brianni A Coltrane is a 61 y.o. year old female who is a primary care patient of [name], MD. Nitika A Krull is currently enrolled in care management services. An additional referral for Licensed Clinical SW was placed.  ? ?Follow up plan: ?Telephone appointment with care management team member scheduled for: 11/28/2021 ? ?Amarissa Koerner, CCMA ?Care Guide, Embedded Care Coordination ?[place]  Care Management  ?Direct Dial: [phone] ? ? ?
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
## 2021-11-11 NOTE — Telephone Encounter (Signed)
? ?  Telephone encounter was:  Unsuccessful.  11/11/2021 ?Name: Kaylee [name] MRN: [id number] DOB: 06-15-1961 ? ?Unsuccessful outbound call made today to assist with:  Food Insecurity and housing  ? ?Outreach Attempt:  2nd Attempt ? ?A HIPAA compliant voice message was left requesting a return call.  Instructed patient to call back at   Instructed patient to call back at [phone]  at their earliest convenience. . ? ?Kaylee [name] ?Care Guide , Embedded Care Coordination ?[place], Care Management  ?[phone] ?[address] [place] , [place] [address] ?Email : [email or web address] ?  ?

## 2021-11-12 ENCOUNTER — Other Ambulatory Visit: Payer: Self-pay | Admitting: Family Medicine

## 2021-11-12 NOTE — Telephone Encounter (Signed)
Last office visit 10/28/2021 for Chronic pain management.  Last refilled:  Not on current medication list.  Refill? ? ?

## 2021-11-12 NOTE — Telephone Encounter (Signed)
Oxycodone last filled 10-16-21 #90 ?Diazepam last filled 10-16-21 #60 ?Alprazolam last filled 10-16-21 #5 ?Methocarbamol last filled 10-16-21 #60 ? ?Last OV 10-28-21 ?Next OV 11-25-21 ? ?Last UDS 10-28-21 ?Last CSA 09-06-20 ? ?[facility]  ?

## 2021-11-14 ENCOUNTER — Encounter (HOSPITAL_BASED_OUTPATIENT_CLINIC_OR_DEPARTMENT_OTHER): Payer: Medicare Other | Admitting: General Surgery

## 2021-11-14 NOTE — Telephone Encounter (Signed)
ERx 

## 2021-11-17 ENCOUNTER — Encounter (HOSPITAL_BASED_OUTPATIENT_CLINIC_OR_DEPARTMENT_OTHER): Payer: Medicare Other | Attending: General Surgery | Admitting: General Surgery

## 2021-11-18 ENCOUNTER — Emergency Department: Payer: Medicare Other

## 2021-11-18 ENCOUNTER — Emergency Department
Admission: EM | Admit: 2021-11-18 | Discharge: 2021-11-18 | Disposition: A | Discharge status: Home / Self Care | Payer: Medicare Other | Attending: Emergency Medicine | Admitting: Emergency Medicine

## 2021-11-18 ENCOUNTER — Other Ambulatory Visit: Payer: Self-pay

## 2021-11-18 DIAGNOSIS — J45909 Unspecified asthma, uncomplicated: Secondary | ICD-10-CM | POA: Insufficient documentation

## 2021-11-18 DIAGNOSIS — I878 Other specified disorders of veins: Secondary | ICD-10-CM | POA: Diagnosis not present

## 2021-11-18 DIAGNOSIS — M533 Sacrococcygeal disorders, not elsewhere classified: Secondary | ICD-10-CM | POA: Diagnosis not present

## 2021-11-18 DIAGNOSIS — M549 Dorsalgia, unspecified: Secondary | ICD-10-CM | POA: Diagnosis present

## 2021-11-18 DIAGNOSIS — Z87891 Personal history of nicotine dependence: Secondary | ICD-10-CM | POA: Insufficient documentation

## 2021-11-18 DIAGNOSIS — S79912A Unspecified injury of left hip, initial encounter: Secondary | ICD-10-CM | POA: Diagnosis not present

## 2021-11-18 DIAGNOSIS — J449 Chronic obstructive pulmonary disease, unspecified: Secondary | ICD-10-CM | POA: Diagnosis not present

## 2021-11-18 DIAGNOSIS — W010XXA Fall on same level from slipping, tripping and stumbling without subsequent striking against object, initial encounter: Secondary | ICD-10-CM | POA: Diagnosis not present

## 2021-11-18 MED ORDER — LIDOCAINE 5 % EX PTCH
1.0000 | MEDICATED_PATCH | CUTANEOUS | Status: DC
  Administered 2021-11-18: 1 via TRANSDERMAL
  Filled 2021-11-18: qty 1

## 2021-11-18 MED ORDER — ACETAMINOPHEN 500 MG PO TABS
1000.0000 mg | ORAL_TABLET | Freq: Once | ORAL | Status: AC
  Administered 2021-11-18: 1000 mg via ORAL
  Filled 2021-11-18: qty 2

## 2021-11-18 MED ORDER — OXYCODONE-ACETAMINOPHEN 5-325 MG PO TABS: 1.0000 | ORAL_TABLET | ORAL | 0 refills | Status: AC | PRN

## 2021-11-18 MED ORDER — LIDOCAINE 5 % EX PTCH: 1.0000 | MEDICATED_PATCH | CUTANEOUS | 0 refills | Status: DC

## 2021-11-18 NOTE — Discharge Instructions (Signed)
Your x-ray does not show any broken bone in your pelvis sacrum or coccyx.  You likely bruised the area.  You can use the Lidoderm patch for pain I have also prescribed you a prescription for Percocet which you can take as needed in addition to Motrin.  If your pain is not improving, please follow-up with your primary care provider.  If your pain worsens or you develop any neurologic symptoms such as weakness or numbness in the legs please return to the emergency department. ?

## 2021-11-18 NOTE — ED Triage Notes (Signed)
To triage via wheelchair. Reports Had fall x 7 days ago. Tripped in gravel. Falling onto left hip. C/o pain to Left hip and coccyx. Reports taking muscle relaxers at home without improvement. Denies anticoagulants. Denies LOC. +CMS to affected extremity. No shortening or rotation noted. Pt states still able to ambulate, but is painful ?
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
## 2021-11-19 ENCOUNTER — Telehealth: Payer: Self-pay

## 2021-11-19 NOTE — Chronic Care Management (AMB) (Signed)
? ? ?Chronic Care Management ?Pharmacy Assistant  ? ?Name: Kaylee [name]  MRN: [id number] DOB: 04-09-1961 ? ?Reason for Encounter: Reminder Call ?  ?Conditions to be addressed/monitored: ?HLD and COPD ? ? ?Medications: ?Outpatient Encounter Medications as of 11/19/2021  ?Medication Sig  ? acetaminophen (TYLENOL) 500 MG tablet Take 1,000 mg by mouth every 6 (six) hours as needed for mild pain.  ? acyclovir (ZOVIRAX) 400 MG tablet TAKE 1 TABLET BY MOUTH TWICE A DAY  ? ADVAIR DISKUS 250-50 MCG/ACT AEPB INHALE 1 PUFF INTO THE LUNGS TWICE DAILY  ? albuterol (VENTOLIN HFA) 108 (90 Base) MCG/ACT inhaler INHALE 2 PUFFS INTO THE LUNGS EVERY 4 HOURS AS NEEDED. (Patient taking differently: Inhale 2 puffs into the lungs every 4 (four) hours as needed for wheezing or shortness of breath. INHALE 2 PUFFS INTO THE LUNGS EVERY 4 HOURS AS NEEDED)  ? allopurinol (ZYLOPRIM) 100 MG tablet TAKE 1 TABLET BY MOUTH DAILY  ? ALPRAZolam (XANAX) 1 MG tablet TAKE 1 TABLET BY MOUTH DAILY AS NEEDED FOR ANXIETY  ? ARIPiprazole (ABILIFY) 20 MG tablet Take 1 tablet (20 mg total) by mouth daily.  ? atorvastatin (LIPITOR) 10 MG tablet TAKE 1 TABLET BY MOUTH DAILY (Patient taking differently: Take 10 mg by mouth daily.)  ? cephALEXin (KEFLEX) 500 MG capsule Take 500 mg by mouth 4 (four) times daily. For 10 days  ? citalopram (CELEXA) 10 MG tablet TAKE 1 TABLET BY MOUTH DAILY (Patient taking differently: Take 10 mg by mouth daily.)  ? colchicine 0.6 MG tablet TAKE 1 TABLET BY MOUTH DAILY AS NEEDED  ? diazepam (VALIUM) 10 MG tablet TAKE 1 TABLET BY MOUTH TWICE A DAY  ? dicyclomine (BENTYL) 10 MG capsule TAKE 1 CAPSULE BY MOUTH 3 TIMES A DAY ASNEEDED FOR SPASMS. TAKE MEDICATION WITH MEALS.  ? diphenhydrAMINE (BENADRYL) 25 mg capsule Take 50 mg by mouth every 6 (six) hours as needed for sleep.  ? esomeprazole (NEXIUM) 40 MG capsule TAKE 1 CAPSULE BY MOUTH DAILY  ? etodolac (LODINE) 400 MG tablet TAKE 1 TABLET BY MOUTH TWICE A DAY AS NEEDED FOR MODERATE  PAIN. (Patient taking differently: Take 400 mg by mouth 2 (two) times daily.)  ? fluticasone (FLONASE) 50 MCG/ACT nasal spray Place 2 sprays into both nostrils daily.  ? furosemide (LASIX) 40 MG tablet TAKE 1 TABLET BY MOUTH DAILY AS NEEDED FOR FLUID  ? lidocaine (LIDODERM) 5 % Place 1 patch onto the skin daily. Remove & Discard patch within 12 hours or as directed by MD  ? linaclotide (LINZESS) 290 MCG CAPS capsule TAKE 1 CAPSULE BY MOUTH DAILY BEFORE BREAKFAST  ? methocarbamol (ROBAXIN) 750 MG tablet TAKE 1 TABLET BY MOUTH TWICE A DAY AS NEEDED FOR MUSCLE SPASMS  ? MISC NATURAL PRODUCTS PO Take by mouth. Keto diet supplement pills  ? NEOMYCIN-POLYMYXIN-HYDROCORTISONE (CORTISPORIN) 1 % SOLN OTIC solution PLACE 3 DROPS INTO THE LEFT EAR 3 TIMES A DAY AS DIRECTED.  ? nitrofurantoin (MACRODANTIN) 100 MG capsule TAKE 1 CAPSULE BY MOUTH DAILY  ? omega-3 acid ethyl esters (LOVAZA) 1 g capsule Take 2 capsules (2 g total) by mouth daily.  ? oxyCODONE-acetaminophen (PERCOCET) 5-325 MG tablet Take 1 tablet by mouth every 4 (four) hours as needed for up to 5 days for severe pain.  ? oxyCODONE-acetaminophen (PERCOCET) 7.5-325 MG tablet TAKE 1 TABLET BY MOUTH EVERY 8 HOURS AS NEEDED FOR PAIN  ? potassium chloride (KLOR-CON) 10 MEQ tablet TAKE 1 TABLET BY MOUTH DAILY  ? senna (SENOKOT) 8.6  MG TABS tablet Take 1 tablet (8.6 mg total) by mouth 2 (two) times daily.  ? SPIRIVA HANDIHALER 18 MCG inhalation capsule INHALE ONE CAPSULE AS DIRECTED ONCE A DAY  ? ?No facility-administered encounter medications on file as of 11/19/2021.  ? ?Kaylee [name] was contacted to remind of upcoming telephone visit with Kaylee [name] on 11/24/21 at 11:45am. Patient was reminded to have any blood glucose and blood pressure readings available for review at appointment.  ? ?Patient confirmed appointment. ? ? ?Are you having any problems with your medications? No  ? ?Do you have any concerns you like to discuss with the pharmacist? No  just had a  fall and has tailbone pain  ? ? ? ?Star Rating Drugs: ?Medication:  Last Fill: Day Supply ?Atorvastatin 10mg  11/13/21  30 ? ?Kaylee [name], CPP notified ? ?Kaylee [name], CCMA ?Health concierge  ?[phone] ?

## 2021-11-22 ENCOUNTER — Other Ambulatory Visit: Payer: Self-pay | Admitting: Family Medicine

## 2021-11-22 DIAGNOSIS — R892 Abnormal level of other drugs, medicaments and biological substances in specimens from other organs, systems and tissues: Secondary | ICD-10-CM

## 2021-11-24 ENCOUNTER — Telehealth: Payer: Medicare Other

## 2021-11-24 ENCOUNTER — Telehealth: Payer: Self-pay | Admitting: Pharmacist
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
## 2021-11-24 NOTE — Telephone Encounter (Signed)
?  Chronic Care Management  ? ?Outreach Note ? ?11/24/2021 ?Name: PAYTTON SNEARY MRN: [id number] DOB: 09/21/1960 ? ?Referred by: [name], MD ? ?Patient had a phone appointment scheduled with clinical pharmacist today. ? ?An unsuccessful telephone outreach was attempted today. The patient was referred to the pharmacist for assistance with medications, care management and care coordination.  ? ?Patient will NOT be penalized in any way for missing a CCM appointment. The no-show fee does not apply. ? ?If possible, a message was left to return call to: [phone] or to [facility]. ? ?Charlene Brooke, PharmD, BCACP ?Clinical Pharmacist ?[facility] ?[phone] ? ? ?

## 2021-11-25 ENCOUNTER — Encounter: Payer: Medicare Other | Admitting: Family Medicine

## 2021-11-26 NOTE — Progress Notes (Addendum)
Called patient to reschedule her cancelled telephone appointment with Al CorpusLindsey Foltanski. No answer; left message. ? ?Al CorpusLindsey Foltanski, CPP notified ? ?Claudina LickAmy Joselinne Lawal, RMA ?Clinical Pharmacy Assistant ?[phone] ?

## 2021-11-27 NOTE — Progress Notes (Signed)
Called patient to reschedule her cancelled telephone appointment with Al Corpus. 06/28 at 3:45.    Al Corpus, CPP notified   Claudina Lick, [place] Clinical Pharmacy Assistant [phone]

## 2021-11-28 ENCOUNTER — Ambulatory Visit (INDEPENDENT_AMBULATORY_CARE_PROVIDER_SITE_OTHER): Payer: Medicare Other | Admitting: *Deleted

## 2021-11-28 DIAGNOSIS — E785 Hyperlipidemia, unspecified: Secondary | ICD-10-CM

## 2021-11-28 DIAGNOSIS — Z599 Problem related to housing and economic circumstances, unspecified: Secondary | ICD-10-CM

## 2021-11-28 DIAGNOSIS — S81802S Unspecified open wound, left lower leg, sequela: Secondary | ICD-10-CM

## 2021-11-28 DIAGNOSIS — G894 Chronic pain syndrome: Secondary | ICD-10-CM

## 2021-11-28 DIAGNOSIS — F411 Generalized anxiety disorder: Secondary | ICD-10-CM

## 2021-11-28 DIAGNOSIS — F319 Bipolar disorder, unspecified: Secondary | ICD-10-CM

## 2021-12-01 ENCOUNTER — Telehealth: Payer: Self-pay | Admitting: *Deleted

## 2021-12-01 NOTE — Telephone Encounter (Signed)
   Telephone encounter was:  Unsuccessful.  12/01/2021 Name: Kaylee [name] MRN: [id number] DOB: 1961/01/06  Unsuccessful outbound call made today to assist with:  Followup regarding food stamps , patient mailbox is full , LCSW requested reach out to food stamps addional information made available    Outreach Attempt:  3rd Attempt.  Referral closed unable to contact patient.  Followup regarding food stamps , patient mailbox is full , LCSW requested reach out to food stamps addional information made available    Alois Cliche -[facility] Guide , Embedded Care Coordination [facility], Care Management  [phone] [address] [place] , [place] [address] Email : [email or web address]

## 2021-12-01 NOTE — Patient Instructions (Signed)
Visit Information  Thank you for taking time to visit with me today. Please don't hesitate to contact me if I can be of assistance to you before our next scheduled telephone appointment.  Following are the goals we discussed today:  Follow up on resources for housing, food stamps, food pantry Follow up to schedule counseling support with in-network provider once resources recieved  Our next appointment is by telephone on 12/26/21  Please call the care guide team at [phone] if you need to cancel or reschedule your appointment.   If you are experiencing a Mental Health or Behavioral Health Crisis or need someone to talk to, please call the [place] National Suicide Prevention Lifeline: (573)299-1463 or TTY: (440)820-7771 TTY [phone]) to talk to a trained counselor call 911   The patient verbalized understanding of instructions, educational materials, and care plan provided today and DECLINED offer to receive copy of patient instructions, educational materials, and care plan.   Reece Levy MSW, LCSW Licensed Clinical Social Worker [facility] [place]   [phone]
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
## 2021-12-10 DIAGNOSIS — F3189 Other bipolar disorder: Secondary | ICD-10-CM

## 2021-12-15 ENCOUNTER — Other Ambulatory Visit: Payer: Self-pay | Admitting: Family Medicine

## 2021-12-16 ENCOUNTER — Other Ambulatory Visit: Payer: Self-pay | Admitting: Family Medicine

## 2021-12-16 NOTE — Telephone Encounter (Signed)
Name of Medication: Alprazolam, Diazepam Name of [facility]acy: [facility] [place] or Written Date and Quantity: 11/14/21      Alprazolam- # 5      Diazepam- #60 Last Office Visit and Type: 10/23/21, f/u Next Office Visit and Type: 12/26/21, f/u Last Controlled Substance Agreement Date: 09/06/20 Last UDS: 09/06/20  Cortisporin 1% last filled 11/14/21, #10 mL

## 2021-12-16 NOTE — Telephone Encounter (Signed)
Name of Medication: Oxycodone-APAP Name of Pharmacy: [facility] Last [place] or Written Date and Quantity: 11/14/21, #90 Last Office Visit and Type: 10/23/21, f/u Next Office Visit and Type: 12/26/21, f/u Last Controlled Substance Agreement Date: 09/06/20 Last UDS: 09/06/20

## 2021-12-18 ENCOUNTER — Other Ambulatory Visit: Payer: Self-pay | Admitting: Family Medicine

## 2021-12-18 NOTE — Telephone Encounter (Signed)
ERx 

## 2021-12-19 NOTE — Telephone Encounter (Signed)
Colchicine last filled:  11/14/21, #30 Allopurinol last filled:  11/13/21, #90 Advair last filled:  11/13/21, #60 ea Bentyl last filled:  11/13/21, #60 Lovaza last filled  11/13/21, #180 Celexa last filled:  11/13/21, #90 Last OV:  10/28/21, f/u Next OV:  12/26/21, f/u

## 2021-12-19 NOTE — Telephone Encounter (Signed)
ERx 

## 2021-12-26 ENCOUNTER — Ambulatory Visit (INDEPENDENT_AMBULATORY_CARE_PROVIDER_SITE_OTHER): Payer: Medicare Other | Admitting: Family Medicine

## 2021-12-26 ENCOUNTER — Encounter: Payer: Self-pay | Admitting: Family Medicine

## 2021-12-26 ENCOUNTER — Telehealth: Payer: Medicare Other

## 2021-12-26 VITALS — BP 140/76 | HR 95 | Temp 97.9°F | Ht 65.0 in | Wt 195.1 lb

## 2021-12-26 DIAGNOSIS — F1510 Other stimulant abuse, uncomplicated: Secondary | ICD-10-CM

## 2021-12-26 DIAGNOSIS — R350 Frequency of micturition: Secondary | ICD-10-CM | POA: Insufficient documentation

## 2021-12-26 DIAGNOSIS — S81802S Unspecified open wound, left lower leg, sequela: Secondary | ICD-10-CM

## 2021-12-26 DIAGNOSIS — R892 Abnormal level of other drugs, medicaments and biological substances in specimens from other organs, systems and tissues: Secondary | ICD-10-CM | POA: Diagnosis not present

## 2021-12-26 DIAGNOSIS — E669 Obesity, unspecified: Secondary | ICD-10-CM

## 2021-12-26 DIAGNOSIS — G8929 Other chronic pain: Secondary | ICD-10-CM | POA: Diagnosis not present

## 2021-12-26 DIAGNOSIS — N3020 Other chronic cystitis without hematuria: Secondary | ICD-10-CM | POA: Diagnosis not present

## 2021-12-26 DIAGNOSIS — F22 Delusional disorders: Secondary | ICD-10-CM

## 2021-12-26 DIAGNOSIS — F319 Bipolar disorder, unspecified: Secondary | ICD-10-CM

## 2021-12-26 LAB — POC URINALSYSI DIPSTICK (AUTOMATED)
Blood, UA: NEGATIVE
Glucose, UA: NEGATIVE
Ketones, UA: NEGATIVE
Leukocytes, UA: NEGATIVE
Nitrite, UA: NEGATIVE
Protein, UA: NEGATIVE
Spec Grav, UA: 1.015 (ref 1.010–1.025)
Urobilinogen, UA: 0.2 E.U./dL
pH, UA: 7 (ref 5.0–8.0)

## 2021-12-26 MED ORDER — FUROSEMIDE 40 MG PO TABS: 40.0000 mg | ORAL_TABLET | Freq: Every day | ORAL | 0 refills | Status: DC

## 2021-12-26 MED ORDER — ARIPIPRAZOLE 20 MG PO TABS: 20.0000 mg | ORAL_TABLET | Freq: Every day | ORAL | 11 refills | Status: DC

## 2021-12-26 MED ORDER — CEPHALEXIN 500 MG PO CAPS: 500.0000 mg | ORAL_CAPSULE | Freq: Two times a day (BID) | ORAL | 0 refills | Status: DC

## 2021-12-26 NOTE — Progress Notes (Signed)
Patient ID: Kaylee [name], female    DOB: May 23, 1961, 61 y.o.   MRN: [id number]  This visit was conducted in person.  BP 140/76   Pulse 95   Temp 97.9 F (36.6 C) (Temporal)   Ht  (1.651 m)   Wt 195 lb 2 oz (88.5 kg)   SpO2 94%   BMI 32.47 kg/m    CC: med refill visit  Subjective:   HPI: Kaylee [name] is a 61 y.o. female presenting on 12/26/2021 for Medication Refill and Urinary Frequency (C/o urinary frequency, low back pain and fever.  )   4d h/o body aches, urinary frequency, felt feverish with chills. Mild dysuria, urgency. Some lower back pain.  No blood in urine.  No recent UTI.  H/o recurrent UTIs has been on macrodantin daily preventatively.  She drinks water, but also pepsi.   She's doubled dose of macrodantin to BID for 4 days.   25 lb weight gain in the past month.  She has bought a Electronics engineer, looking for location to keep it.  No dyspnea, chest pain or leg swelling.   Chronic L leg wound - changes dressing daily, uses compression stocking at night.   Did not see wound clinic or psychiatrist (referred 10/2021).      Relevant past medical, surgical, family and social history reviewed and updated as indicated. Interim medical history since our last visit reviewed. Allergies and medications reviewed and updated. Outpatient Medications Prior to Visit  Medication Sig Dispense Refill   acetaminophen (TYLENOL) 500 MG tablet Take 1,000 mg by mouth every 6 (six) hours as needed for mild pain.     acyclovir (ZOVIRAX) 400 MG tablet TAKE 1 TABLET BY MOUTH TWICE A DAY 60 tablet 3   ADVAIR DISKUS 250-50 MCG/ACT AEPB INHALE 1 PUFF INTO THE LUNGS TWICE DAILY 60 each 3   albuterol (VENTOLIN HFA) 108 (90 Base) MCG/ACT inhaler INHALE 2 PUFFS INTO THE LUNGS EVERY 4 HOURS AS NEEDED. (Patient taking differently: Inhale 2 puffs into the lungs every 4 (four) hours as needed for wheezing or shortness of breath. INHALE 2 PUFFS INTO THE LUNGS EVERY 4 HOURS AS NEEDED) 18 g 3    allopurinol (ZYLOPRIM) 100 MG tablet TAKE 1 TABLET BY MOUTH DAILY 90 tablet 0   ALPRAZolam (XANAX) 1 MG tablet TAKE 1 TABLET BY MOUTH DAILY AS NEEDED FOR ANXIETY 5 tablet 0   atorvastatin (LIPITOR) 10 MG tablet TAKE 1 TABLET BY MOUTH DAILY (Patient taking differently: Take 10 mg by mouth daily.) 90 tablet 1   citalopram (CELEXA) 10 MG tablet TAKE 1 TABLET BY MOUTH DAILY 90 tablet 1   colchicine 0.6 MG tablet TAKE 1 TABLET BY MOUTH DAILY AS NEEDED 30 tablet 0   diazepam (VALIUM) 10 MG tablet TAKE 1 TABLET BY MOUTH TWICE A DAY 60 tablet 0   dicyclomine (BENTYL) 10 MG capsule TAKE 1 CAPSULE BY MOUTH 3 TIMES A DAY ASNEEDED FOR SPASMS. TAKE MEDICATION WITH MEALS. 60 capsule 3   diphenhydrAMINE (BENADRYL) 25 mg capsule Take 50 mg by mouth every 6 (six) hours as needed for sleep.     esomeprazole (NEXIUM) 40 MG capsule TAKE 1 CAPSULE BY MOUTH DAILY 90 capsule 0   etodolac (LODINE) 400 MG tablet TAKE 1 TABLET BY MOUTH TWICE A DAY AS NEEDED FOR MODERATE PAIN. (Patient taking differently: Take 400 mg by mouth 2 (two) times daily.) 60 tablet 11   fluticasone (FLONASE) 50 MCG/ACT nasal spray Place 2 sprays into both  nostrils daily. 16 g 11   lidocaine (LIDODERM) 5 % Place 1 patch onto the skin daily. Remove & Discard patch within 12 hours or as directed by MD 30 patch 0   linaclotide (LINZESS) 290 MCG CAPS capsule TAKE 1 CAPSULE BY MOUTH DAILY BEFORE BREAKFAST 30 capsule 3   methocarbamol (ROBAXIN) 750 MG tablet TAKE 1 TABLET BY MOUTH TWICE A DAY AS NEEDED FOR MUSCLE SPASMS 60 tablet 1   MISC NATURAL PRODUCTS PO Take by mouth. Keto diet supplement pills     NEOMYCIN-POLYMYXIN-HYDROCORTISONE (CORTISPORIN) 1 % SOLN OTIC solution PLACE 3 DROPS INTO THE LEFT EAR 3 TIMES A DAY AS DIRECTED. 10 mL 0   nitrofurantoin (MACRODANTIN) 100 MG capsule TAKE 1 CAPSULE BY MOUTH DAILY 90 capsule 0   omega-3 acid ethyl esters (LOVAZA) 1 g capsule TAKE 2 CAPSULES (2 GRAMS TOTAL) BY MOUTHDAILY 180 capsule 1    oxyCODONE-acetaminophen (PERCOCET) 7.5-325 MG tablet TAKE 1 TABLET BY MOUTH EVERY 8 HOURS AS NEEDED FOR PAIN 90 tablet 0   potassium chloride (KLOR-CON) 10 MEQ tablet TAKE 1 TABLET BY MOUTH DAILY 90 tablet 0   senna (SENOKOT) 8.6 MG TABS tablet Take 1 tablet (8.6 mg total) by mouth 2 (two) times daily. 120 tablet 0   SPIRIVA HANDIHALER 18 MCG inhalation capsule INHALE ONE CAPSULE AS DIRECTED ONCE A DAY 90 capsule 0   ARIPiprazole (ABILIFY) 20 MG tablet Take 1 tablet (20 mg total) by mouth daily. 30 tablet 11   cephALEXin (KEFLEX) 500 MG capsule Take 500 mg by mouth 4 (four) times daily. For 10 days     furosemide (LASIX) 40 MG tablet TAKE 1 TABLET BY MOUTH DAILY AS NEEDED FOR FLUID 90 tablet 0   No facility-administered medications prior to visit.     Per HPI unless specifically indicated in ROS section below Review of Systems  Objective:  BP 140/76   Pulse 95   Temp 97.9 F (36.6 C) (Temporal)   Ht 5\' 5"  (1.651 m)   Wt 195 lb 2 oz (88.5 kg)   SpO2 94%   BMI 32.47 kg/m   Wt Readings from Last 3 Encounters:  12/26/21 195 lb 2 oz (88.5 kg)  11/18/21 170 lb (77.1 kg)  10/28/21 173 lb 8 oz (78.7 kg)      Physical Exam Vitals and nursing note reviewed.  Constitutional:      Appearance: Normal appearance. She is not ill-appearing.  Cardiovascular:     Rate and Rhythm: Normal rate and regular rhythm.     Pulses: Normal pulses.     Heart sounds: Normal heart sounds. No murmur heard. Pulmonary:     Effort: Pulmonary effort is normal. No respiratory distress.     Breath sounds: Normal breath sounds. No wheezing, rhonchi or rales.  Abdominal:     General: Bowel sounds are normal. There is no distension.     Palpations: Abdomen is soft. There is no mass.     Tenderness: There is no abdominal tenderness. There is no right CVA tenderness, left CVA tenderness, guarding or rebound.     Hernia: No hernia is present.  Musculoskeletal:        General: Swelling present.     Right lower  leg: No edema.     Left lower leg: Edema (chronic 1+) present.  Skin:    General: Skin is warm and dry.     Findings: Wound present.          Comments: 3x7cm superficial wound to medial  left lower leg without surrounding erythema or significant drainage  Neurological:     Mental Status: She is alert.  Psychiatric:        Mood and Affect: Mood normal.        Behavior: Behavior normal.       Results for orders placed or performed in visit on 12/26/21  POCT Urinalysis Dipstick (Automated)  Result Value Ref Range   Color, UA dark yellow    Clarity, UA clear    Glucose, UA Negative Negative   Bilirubin, UA 3+    Ketones, UA negative    Spec Grav, UA 1.015 1.010 - 1.025   Blood, UA negative    pH, UA 7.0 5.0 - 8.0   Protein, UA Negative Negative   Urobilinogen, UA 0.2 0.2 or 1.0 E.U./dL   Nitrite, UA negative    Leukocytes, UA Negative Negative   Assessment & Plan:   Problem List Items Addressed This Visit     Encounter for chronic pain management (Chronic)    Update urine amphetamine screen - D/L isomers in setting of recurrent positive UDS for amphetamines.       Relevant Orders   DRUG MONITORING, PANEL 8 WITH CONFIRMATION, URINE   Chronic cystitis    Again reviewed lung risks of long-term nitrofurantoin use - she desires to continue.       Abnormal drug screen    Recent UDS positive for methamphetamine - pt attributes to OTC decongestant use - will recheck today along with D/L isomers.       Obesity, Class I, BMI 30-34.9    Discussed noted weight gain, 25 lbs in past 6 wks.       Bipolar 1 disorder (HCC)    On abilify, celexa, benzo.  H/o severe delusional parasitosis.  Most recently abilify increased to 20mg  daily. Has not followed up with psychiatrist as recommended.       Ekbom's delusional parasitosis (HCC)   Wound of left leg, sequela    Continues Dukal bismuth/petroleum gauze daily dressing changes.  No showed wound clinic appt last month. Advised to  keep wound care appointment. New referral placed.       Relevant Orders   Ambulatory referral to Wound Clinic   Methamphetamine use (HCC)    Pt attributes UDS + to OTC decongestant use.  Update UDS with amphetamine D/L isomers.       Urinary frequency - Primary    Describes possible UTI. UA today with bilirubin but no signs of infection, in setting of self-increasing macrobid to 100mg  BID x 4 days. Will send UCx and Rx keflex 1 wk course.       Relevant Orders   POCT Urinalysis Dipstick (Automated) (Completed)   Urine Culture     Meds ordered this encounter  Medications   ARIPiprazole (ABILIFY) 20 MG tablet    Sig: Take 1 tablet (20 mg total) by mouth daily.    Dispense:  30 tablet    Refill:  11   furosemide (LASIX) 40 MG tablet    Sig: Take 1 tablet (40 mg total) by mouth daily.    Dispense:  90 tablet    Refill:  0   cephALEXin (KEFLEX) 500 MG capsule    Sig: Take 1 capsule (500 mg total) by mouth 2 (two) times daily.    Dispense:  14 capsule    Refill:  0   Orders Placed This Encounter  Procedures   Urine Culture   DRUG MONITORING, PANEL 8  WITH CONFIRMATION, URINE    Oxycodone, Xanax   Ambulatory referral to Wound Clinic    Referral Priority:   Routine    Referral Type:   Consultation    Referral Reason:   Specialty Services Required    Requested Specialty:   Wound Care    Number of Visits Requested:   1   POCT Urinalysis Dipstick (Automated)     Patient Instructions  Take keflex twice daily for 1 week.  Urine culture sent Urine drug screen sent  We will refer you back to the wound clinic - keep this appointment.  Keep August appointment.   Follow up plan: Return if symptoms worsen or fail to improve.  [name], MD

## 2021-12-26 NOTE — Assessment & Plan Note (Signed)
On abilify, celexa, benzo.  H/o severe delusional parasitosis.  Most recently abilify increased to 20mg  daily. Has not followed up with psychiatrist as recommended.

## 2021-12-26 NOTE — Assessment & Plan Note (Signed)
Continues Dukal bismuth/petroleum gauze daily dressing changes.  No showed wound clinic appt last month. Advised to keep wound care appointment. New referral placed.

## 2021-12-26 NOTE — Assessment & Plan Note (Signed)
Discussed noted weight gain, 25 lbs in past 6 wks.

## 2021-12-26 NOTE — Patient Instructions (Addendum)
Take keflex twice daily for 1 week.  Urine culture sent Urine drug screen sent  We will refer you back to the wound clinic - keep this appointment.  Keep August appointment.

## 2021-12-26 NOTE — Assessment & Plan Note (Signed)
Again reviewed lung risks of long-term nitrofurantoin use - she desires to continue.

## 2021-12-26 NOTE — Assessment & Plan Note (Addendum)
Recent UDS positive for methamphetamine - pt attributes to OTC decongestant use - will recheck today along with D/L isomers.

## 2021-12-26 NOTE — Assessment & Plan Note (Signed)
Describes possible UTI. UA today with bilirubin but no signs of infection, in setting of self-increasing macrobid to 100mg  BID x 4 days. Will send UCx and Rx keflex 1 wk course.

## 2021-12-26 NOTE — Assessment & Plan Note (Addendum)
Pt attributes UDS + to OTC decongestant use.  Update UDS with amphetamine D/L isomers.

## 2021-12-26 NOTE — Assessment & Plan Note (Signed)
Update urine amphetamine screen - D/L isomers in setting of recurrent positive UDS for amphetamines.

## 2021-12-27 LAB — URINE CULTURE
MICRO NUMBER:: 13535884
Result:: NO GROWTH
SPECIMEN QUALITY:: ADEQUATE

## 2021-12-28 LAB — DRUG MONITOR,AMPHETAMINE,W/DL, QN URINE
Amphetamine: NEGATIVE ng/mL (ref <250)
Methamphetamine: NEGATIVE ng/mL (ref <250)

## 2021-12-28 LAB — DM TEMPLATE

## 2021-12-29 ENCOUNTER — Telehealth: Payer: Self-pay | Admitting: *Deleted

## 2021-12-29 NOTE — Telephone Encounter (Signed)
  Care Management   Follow Up Note   12/29/2021 Name: Kaylee [name] MRN: [id number] DOB: 29-Apr-1961   Referred by: [name], MD Reason for referral : No chief complaint on file. LATE ENTRY  An unsuccessful telephone outreach was attempted today. The patient was referred to the case management team for assistance with care management and care coordination.   Follow Up Plan: The care management team will reach out to the patient again over the next 10 days.   Reece Levy MSW, LCSW Licensed Clinical Social Worker [facility]   [phone]

## 2021-12-29 NOTE — Telephone Encounter (Signed)
  Care Management   Follow Up Note   12/29/2021 Name: Kaylee [name] MRN: [id number] DOB: [birth year]   Referred by: [name], MD Reason for referral : No chief complaint on file.   An unsuccessful telephone outreach was attempted today. The patient was referred to the case management team for assistance with care management and care coordination.   Follow Up Plan: The care management team will reach out to the patient again over the next 10 days.  Reece Levy MSW, LCSW Licensed Clinical Social Worker [facility]   [phone]

## 2021-12-29 NOTE — Telephone Encounter (Signed)
  Care Management   Follow Up Note   12/29/2021 Name: Kaylee [name] MRN: [id number] DOB: 07/06/1961   Referred by: Gutierrez, Javier, MD Reason for referral : No chief complaint on file.   An unsuccessful telephone outreach was attempted today. The patient was referred to the case management team for assistance with care management and care coordination.   Follow Up Plan: The care management team will reach out to the patient again over the next 10 days.  Filomeno Cromley MSW, LCSW Licensed Clinical Social Worker [facility]   [phone]  

## 2021-12-30 LAB — DRUG MONITORING, PANEL 8 WITH CONFIRMATION, URINE
6 Acetylmorphine: NEGATIVE ng/mL (ref <10)
Alcohol Metabolites: POSITIVE ng/mL — AB (ref <500)
Alphahydroxyalprazolam: NEGATIVE ng/mL (ref <25)
Alphahydroxymidazolam: NEGATIVE ng/mL (ref <50)
Alphahydroxytriazolam: NEGATIVE ng/mL (ref <50)
Aminoclonazepam: NEGATIVE ng/mL (ref <25)
Amphetamines: NEGATIVE ng/mL (ref <500)
Benzodiazepines: POSITIVE ng/mL — AB (ref <100)
Buprenorphine, Urine: NEGATIVE ng/mL (ref <5)
Cocaine Metabolite: NEGATIVE ng/mL (ref <150)
Codeine: NEGATIVE ng/mL (ref <50)
Creatinine: 88.7 mg/dL (ref >=20.0)
Ethyl Glucuronide (ETG): >10000 ng/mL — ABNORMAL HIGH (ref <500)
Ethyl Sulfate (ETS): 2302 ng/mL — ABNORMAL HIGH (ref <100)
Hydrocodone: NEGATIVE ng/mL (ref <50)
Hydromorphone: NEGATIVE ng/mL (ref <50)
Hydroxyethylflurazepam: NEGATIVE ng/mL (ref <50)
Lorazepam: NEGATIVE ng/mL (ref <50)
MDMA: NEGATIVE ng/mL (ref <500)
Marijuana Metabolite: NEGATIVE ng/mL (ref <20)
Morphine: NEGATIVE ng/mL (ref <50)
Nordiazepam: 507 ng/mL — ABNORMAL HIGH (ref <50)
Norhydrocodone: NEGATIVE ng/mL (ref <50)
Noroxycodone: 1373 ng/mL — ABNORMAL HIGH (ref <50)
Opiates: NEGATIVE ng/mL (ref <100)
Oxazepam: 711 ng/mL — ABNORMAL HIGH (ref <50)
Oxidant: NEGATIVE ug/mL (ref <200)
Oxycodone: 1243 ng/mL — ABNORMAL HIGH (ref <50)
Oxycodone: POSITIVE ng/mL — AB (ref <100)
Oxymorphone: 638 ng/mL — ABNORMAL HIGH (ref <50)
Temazepam: 741 ng/mL — ABNORMAL HIGH (ref <50)
pH: 7.3 (ref 4.5–9.0)

## 2021-12-30 LAB — DM TEMPLATE

## 2022-01-02 ENCOUNTER — Telehealth: Payer: Self-pay

## 2022-01-02 ENCOUNTER — Telehealth: Payer: Medicare Other

## 2022-01-07 ENCOUNTER — Ambulatory Visit (INDEPENDENT_AMBULATORY_CARE_PROVIDER_SITE_OTHER): Payer: Medicare Other | Admitting: Pharmacist

## 2022-01-07 DIAGNOSIS — F411 Generalized anxiety disorder: Secondary | ICD-10-CM

## 2022-01-07 DIAGNOSIS — F319 Bipolar disorder, unspecified: Secondary | ICD-10-CM

## 2022-01-07 DIAGNOSIS — E782 Mixed hyperlipidemia: Secondary | ICD-10-CM

## 2022-01-07 DIAGNOSIS — G894 Chronic pain syndrome: Secondary | ICD-10-CM
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
## 2022-01-09 ENCOUNTER — Ambulatory Visit: Payer: Medicare Other | Admitting: *Deleted

## 2022-01-09 DIAGNOSIS — E785 Hyperlipidemia, unspecified: Secondary | ICD-10-CM | POA: Diagnosis not present

## 2022-01-09 DIAGNOSIS — F319 Bipolar disorder, unspecified: Secondary | ICD-10-CM | POA: Diagnosis not present

## 2022-01-09 DIAGNOSIS — G894 Chronic pain syndrome: Secondary | ICD-10-CM

## 2022-01-09 DIAGNOSIS — I10 Essential (primary) hypertension: Secondary | ICD-10-CM

## 2022-01-09 DIAGNOSIS — E782 Mixed hyperlipidemia: Secondary | ICD-10-CM

## 2022-01-09 DIAGNOSIS — F411 Generalized anxiety disorder: Secondary | ICD-10-CM

## 2022-01-09 DIAGNOSIS — F22 Delusional disorders: Secondary | ICD-10-CM

## 2022-01-09 NOTE — Patient Instructions (Addendum)
Visit Information  Phone number for Pharmacist: [phone]   Goals Addressed   None     Care Plan : CCM Pharmacy Care Plan  Updates made by Kathyrn Sheriff, [facility]: Lambert Rhonda W since 01/09/2022 12:00 AM     Problem: Hyperlipidemia and COPD, Bipolar 1 disorder, Chronic pain   Priority: High     Long-Range Goal: Disease mgmt   Start Date: 10/03/2021  Expected End Date: 10/04/2022  This Visit's Progress: On track  Recent Progress: On track  Priority: High  Note:   Current Barriers:  Does not keep psychiatry appts  Pharmacist Clinical Goal(s):  Patient will contact provider office for questions/concerns as evidenced notation of same in electronic health record through collaboration with PharmD and provider.   Interventions: 1:1 collaboration with [name], MD regarding development and update of comprehensive plan of care as evidenced by provider attestation and co-signature Inter-disciplinary care team collaboration (see longitudinal plan of care) Comprehensive medication review performed; medication list updated in electronic medical record  Hyperlipidemia: (LDL goal < 100) -Controlled - LDL at goal; adherence affirmed -Current treatment: Atorvastatin 10 mg daily - Appropriate, Effective, Safe, Accessible Lovaza 1 g - 2 cap daily -Appropriate, Effective, Safe, Accessible -Medications previously tried: n/a  -Educated on Cholesterol goals;  -Recommended to continue current medication  COPD (Goal: control symptoms and prevent exacerbations) -Controlled - per pt report -Current treatment  Advair Diskus 250-50 mcg/act 1 puff BID -Appropriate, Effective, Safe, Accessible Albuterol HFA prn -Appropriate, Effective, Safe, Accessible Spiriva Handihaler 18 mcg 1 puff daily -Appropriate, Effective, Safe, Accessible -Medications previously tried: n/a  -Pulmonary function testing: not on file -Exacerbations requiring treatment in last 6 months: 0 -Patient reports consistent use of  maintenance inhaler -Counseled on Benefits of consistent maintenance inhaler use -Recommended to continue current medication  Bipolar 1 Disorder (Goal: manage symptoms) -Query controlled - pt has ongoing issues with delusions of parasitosis and patient discussed seeing/feeling parasities in eyes, ears, nose and leg wound again today; she declined psych appt today, she insists her symptoms are purely physical -PHQ9: 0 (05/2020) - minimal depression -GAD7: 4 (05/2020) - minimal anxiety -Connected with PCP for mental health support; previously Dr Lolly Mustache (last 2018); pt has been referred to psychiatry multiple times and has failed to keep appts (last known Nov 2022) despite multiple reminders from staff -Current treatment: Alprazolam 1 mg PRN - Appropriate, Effective, Safe, Accessible Diazepam 10 mg BID -Appropriate, Effective, Safe, Accessible Aripiprazole 20 mg daily -Appropriate, Query Effective Citalopram 10 mg daily -Appropriate, Effective, Safe, Accessible -Medications previously tried/failed: n/a -Educated on Benefits of medication for symptom control -Recommend follow up with psychiatry; pt declined  Chronic pain (Goal: manage pain) -Controlled -Current treatment  Oxycodone-APAP 7.5-325 mg TID #90 -Appropriate, Effective, Safe, Accessible Etodolac 400 mg BID prn -Appropriate, Effective, Safe, Accessible Methocarbamol 750 mg BID prn-Appropriate, Effective, Safe, Accessible Tylenol 500 mg PRN-Appropriate, Effective, Safe, Accessible -Medications previously tried: n/a  -Recommended to continue current medication  Patient Goals/Self-Care Activities Patient will:  - take medications as prescribed as evidenced by patient report and record review -focus on medication adherence by routine- -Contact provider office for questions/concerns      The patient verbalized understanding of instructions, educational materials, and care plan provided today and DECLINED offer to receive copy of  patient instructions, educational materials, and care plan.  The patient has been provided with contact information for the care management team and has been advised to call with any health related questions or concerns.   Al Corpus, PharmD, [place]  Clinical Pharmacist [facility] [phone]
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
## 2022-01-09 NOTE — Patient Instructions (Signed)
Visit Information  Thank you for taking time to visit with me today. Please don't hesitate to contact me if I can be of assistance to you  If you are experiencing a Mental Health or Behavioral Health Crisis or need someone to talk to, please call the [place] National Suicide Prevention Lifeline: 859-884-4913 or TTY: (757)717-2065 TTY [phone]) to talk to a trained counselor call 911   Patient verbalizes understanding of instructions and care plan provided today and agrees to view in MyChart. Active MyChart status and patient understanding of how to access instructions and care plan via MyChart confirmed with patient.     Reece Levy MSW, LCSW Licensed Clinical Social Worker [facility] [place]   [phone]

## 2022-01-14 ENCOUNTER — Other Ambulatory Visit: Payer: Self-pay | Admitting: Family Medicine

## 2022-01-14 NOTE — Telephone Encounter (Signed)
Name of Medication: Alprazolam, Diazepam, Oxycodone-APAP Name of Pharmacy: [facility] Last [place] or Written Date and Quantity: 12/18/21      Alprazolam- #5      Diazepam- #60      Oxycodone-APAP- #90 Last Office Visit and Type: 12/26/21, f/u Next Office Visit and Type: 03/09/22, CPE Last Controlled Substance Agreement Date: 09/06/20 Last UDS: 09/06/20  Robaxin last filled:  12/18/21, #60 Potassium last filled:  12/18/21, #90 Atorvastatin last filled:  12/18/21, #90 Colchicine last filled:  12/20/21, #30

## 2022-01-15 NOTE — Telephone Encounter (Signed)
ERx 

## 2022-01-16 ENCOUNTER — Other Ambulatory Visit: Payer: Self-pay | Admitting: Family Medicine

## 2022-01-16 NOTE — Telephone Encounter (Signed)
Cortisporin last filled:  12/18/21, #10 mL Etodolac last filled:  12/18/21, #60 Last OV:  12/26/21, f/u Next OV:  03/09/22, CPE

## 2022-01-17 NOTE — Telephone Encounter (Signed)
ERx 

## 2022-01-24 ENCOUNTER — Emergency Department
Admission: EM | Admit: 2022-01-24 | Discharge: 2022-01-25 | Disposition: A | Discharge status: Home / Self Care | Payer: Medicare Other | Attending: Emergency Medicine | Admitting: Emergency Medicine

## 2022-01-24 ENCOUNTER — Emergency Department: Payer: Medicare Other

## 2022-01-24 ENCOUNTER — Other Ambulatory Visit: Payer: Self-pay

## 2022-01-24 DIAGNOSIS — R2242 Localized swelling, mass and lump, left lower limb: Secondary | ICD-10-CM | POA: Diagnosis present

## 2022-01-24 DIAGNOSIS — L03116 Cellulitis of left lower limb: Secondary | ICD-10-CM | POA: Insufficient documentation

## 2022-01-24 DIAGNOSIS — J449 Chronic obstructive pulmonary disease, unspecified: Secondary | ICD-10-CM | POA: Insufficient documentation

## 2022-01-24 DIAGNOSIS — M7989 Other specified soft tissue disorders: Secondary | ICD-10-CM | POA: Diagnosis not present

## 2022-01-24 LAB — CBC WITH DIFFERENTIAL/PLATELET
Abs Immature Granulocytes: 0.03 10*3/uL (ref 0.00–0.07)
Basophils Absolute: 0 10*3/uL (ref 0.0–0.1)
Basophils Relative: 0 %
Eosinophils Absolute: 0.2 10*3/uL (ref 0.0–0.5)
Eosinophils Relative: 3 %
HCT: 36.6 % (ref 36.0–46.0)
Hemoglobin: 12.1 g/dL (ref 12.0–15.0)
Immature Granulocytes: 0 %
Lymphocytes Relative: 13 %
Lymphs Abs: 1 10*3/uL (ref 0.7–4.0)
MCH: 29.2 pg (ref 26.0–34.0)
MCHC: 33.1 g/dL (ref 30.0–36.0)
MCV: 88.4 fL (ref 80.0–100.0)
Monocytes Absolute: 0.2 10*3/uL (ref 0.1–1.0)
Monocytes Relative: 2 %
Neutro Abs: 6.2 10*3/uL (ref 1.7–7.7)
Neutrophils Relative %: 82 %
Platelets: 170 10*3/uL (ref 150–400)
RBC: 4.14 MIL/uL (ref 3.87–5.11)
RDW: 13.2 % (ref 11.5–15.5)
WBC: 7.7 10*3/uL (ref 4.0–10.5)
nRBC: 0 % (ref 0.0–0.2)

## 2022-01-24 LAB — BASIC METABOLIC PANEL
Anion gap: 8 (ref 5–15)
BUN: 16 mg/dL (ref 8–23)
CO2: 22 mmol/L (ref 22–32)
Calcium: 8.5 mg/dL — ABNORMAL LOW (ref 8.9–10.3)
Chloride: 104 mmol/L (ref 98–111)
Creatinine, Ser: 0.83 mg/dL (ref 0.44–1.00)
GFR, Estimated: >60 mL/min (ref >60)
Glucose, Bld: 138 mg/dL — ABNORMAL HIGH (ref 70–99)
Potassium: 3 mmol/L — ABNORMAL LOW (ref 3.5–5.1)
Sodium: 134 mmol/L — ABNORMAL LOW (ref 135–145)

## 2022-01-24 MED ORDER — POTASSIUM CHLORIDE CRYS ER 20 MEQ PO TBCR
40.0000 meq | EXTENDED_RELEASE_TABLET | Freq: Once | ORAL | Status: AC
  Administered 2022-01-24: 40 meq via ORAL
  Filled 2022-01-24: qty 2

## 2022-01-24 MED ORDER — DEXTROSE 5 % IV SOLN
1500.0000 mg | Freq: Once | INTRAVENOUS | Status: AC
  Administered 2022-01-24: 1500 mg via INTRAVENOUS
  Filled 2022-01-24: qty 75

## 2022-01-24 MED ORDER — SODIUM CHLORIDE 0.9 % IV BOLUS
1000.0000 mL | Freq: Once | INTRAVENOUS | Status: AC
  Administered 2022-01-24: 1000 mL via INTRAVENOUS

## 2022-01-24 NOTE — ED Provider Notes (Signed)
[facility] Provider Note    Event Date/Time   First MD Initiated Contact with Patient 01/24/22 2137     (approximate)   History   Chief Complaint: Leg Swelling and Cellulitis   HPI  Kaylee [name] is a 61 y.o. female with a history of bipolar disorder, COPD, methamphetamine use, delusional parasitosis, recurrent cellulitis in the left leg who comes the ED complaining of pain redness and swelling of the left leg for the past 3 days.  She reports it is because there are parasites in the leg.  Denies any new trauma.  No fever.  No vomiting or diarrhea.  No chest pain or shortness of breath.  Denies any history of DVT or PE.     Physical Exam   Triage Vital Signs: ED Triage Vitals  Enc Vitals Group     BP 01/24/22 2124 135/84     Pulse Rate 01/24/22 2124 (!) 110     Resp 01/24/22 2124 17     Temp 01/24/22 2124 98.7 F (37.1 C)     Temp Source 01/24/22 2124 Oral     SpO2 01/24/22 2124 96 %     Weight 01/24/22 2125 195 lb (88.5 kg)     Height 01/24/22 2125 5\' 5"  (1.651 m)     Head Circumference --      Peak Flow --      Pain Score 01/24/22 2125 8     Pain Loc --      Pain Edu? --      Excl. in GC? --     Most recent vital signs: Vitals:   01/24/22 2124 01/24/22 2200  BP: 135/84 121/88  Pulse: (!) 110 (!) 107  Resp: 17   Temp: 98.7 F (37.1 C)   SpO2: 96% 94%    General: Awake, no distress.  CV:  Good peripheral perfusion.  Normal peripheral pulses.  Heart rate 90 Resp:  Normal effort.  Clear to auscultation bilaterally Abd:  No distention.  Soft nontender Other:  4 cm soft tissue wound, stage III on the medial aspect of the distal left lower leg.  There is surrounding cellulitis extending from foot to mid thigh.  No crepitus.  Not significantly tender.  Calf circumference is enlarged compared to the right.  Compartments are soft.  No purulent drainage.  Normal distal capillary refill.  Intact range of motion.  Patient is alert and  oriented despite delusions of soft tissue parasite infection.   ED Results / Procedures / Treatments   Labs (all labs ordered are listed, but only abnormal results are displayed) Labs Reviewed  BASIC METABOLIC PANEL - Abnormal; Notable for the following components:      Result Value   Sodium 134 (*)    Potassium 3.0 (*)    Glucose, Bld 138 (*)    Calcium 8.5 (*)    All other components within normal limits  CBC WITH DIFFERENTIAL/PLATELET     EKG    RADIOLOGY Wound left lower extremity interpreted by me, negative for DVT.  Radiology report reviewed   PROCEDURES:  Procedures   MEDICATIONS ORDERED IN ED: Medications  dalbavancin (DALVANCE) 1,500 mg in dextrose 5 % 500 mL IVPB (has no administration in time range)     IMPRESSION / MDM / ASSESSMENT AND PLAN / ED COURSE  I reviewed the triage vital signs and the nursing notes.  Differential diagnosis includes, but is not limited to, cellulitis, DVT.  Doubt osteomyelitis septic arthritis necrotizing fasciitis or abscess.  Exam not consistent with compartment syndrome  Patient's presentation is most consistent with acute presentation with potential threat to life or bodily function.  Patient presents with redness pain and swelling of the left leg.  Pain is mild and she is overall comfortable.  She is not septic.  This is a recurrent issue for her which I think is due to her delusional parasitosis which causes a skin picking disorder which results in wounds that get infected.  I offered admission, but patient refuses.  States she needs to get home so that she can help take care of her grandchild.  She is not paranoid, not psychotic other than her chronic delusion of parasitosis.  Fluent speech and language and otherwise oriented with rational thought process.  She has medical decision-making capacity.  I will give a dose of dalbavancin in the ED and refer her to infectious disease follow-up.   Return precautions discussed.  She also has an adult family member with her who agrees that this is reasonable       FINAL CLINICAL IMPRESSION(S) / ED DIAGNOSES   Final diagnoses:  Left leg cellulitis     Rx / DC Orders   ED Discharge Orders          Ordered    Ambulatory referral to Infectious Disease       Comments: Cellulitis patient:  Received dalbavancin on 01/24/2022.   01/24/22 2156             Note:  This document was prepared using Dragon voice recognition software and may include unintentional dictation errors.   Sharman Cheek, MD 01/24/22 2236

## 2022-01-24 NOTE — ED Triage Notes (Signed)
Pt presents to ER from home c/o left leg swelling.  Pt states she had surgery on her left lower leg appx 1 year ago and states she has "parasites" in her legs that nobody can see, as they are clear.  Pt states her left leg is swollen at baseline, but has become worse, and more swollen in last 3 days.  Pt endorses fever like symptoms at home.  Pt is A&O x4 at this time in NAD.  Pt states she is still able to bear weight on left foot.

## 2022-02-06 ENCOUNTER — Ambulatory Visit: Payer: Medicare Other | Admitting: Internal Medicine

## 2022-02-16 ENCOUNTER — Other Ambulatory Visit: Payer: Self-pay | Admitting: Family Medicine

## 2022-02-16 NOTE — Telephone Encounter (Signed)
ERx 

## 2022-02-16 NOTE — Telephone Encounter (Signed)
Name of Medication: Alprazolam, Diazepam, Oxycodone-APAP Name of Pharmacy: [facility] Last [place] or Written Date and Quantity: 01/16/22      Alprazlam- #5      Diazepam- #60      Oxycodone-APAP- #90 Last Office Visit and Type: 12/26/21, med refill Next Office Visit and Type: 03/09/22, CPE Last Controlled Substance Agreement Date: 09/06/20 Last UDS: 09/06/20

## 2022-02-18 ENCOUNTER — Other Ambulatory Visit: Payer: Self-pay | Admitting: Family Medicine

## 2022-02-18 NOTE — Telephone Encounter (Signed)
Spiriva last filled:  01/16/22, #90 Macrodantin last filled:  01/16/22, #90 Last OV:  12/26/21, med refill Next OV:  03/09/22, CPE

## 2022-02-20 ENCOUNTER — Ambulatory Visit: Payer: Medicare Other | Admitting: Internal Medicine

## 2022-03-02 ENCOUNTER — Encounter: Payer: Self-pay | Admitting: Infectious Disease

## 2022-03-02 ENCOUNTER — Other Ambulatory Visit: Payer: Self-pay

## 2022-03-02 ENCOUNTER — Ambulatory Visit (INDEPENDENT_AMBULATORY_CARE_PROVIDER_SITE_OTHER): Payer: Medicare Other | Admitting: Infectious Disease

## 2022-03-02 ENCOUNTER — Other Ambulatory Visit: Payer: Self-pay | Admitting: Family Medicine

## 2022-03-02 VITALS — BP 128/66 | HR 111 | Resp 16 | Ht 65.0 in | Wt 208.0 lb

## 2022-03-02 DIAGNOSIS — L0390 Cellulitis, unspecified: Secondary | ICD-10-CM | POA: Diagnosis not present

## 2022-03-02 DIAGNOSIS — M726 Necrotizing fasciitis: Secondary | ICD-10-CM | POA: Diagnosis not present

## 2022-03-02 DIAGNOSIS — L308 Other specified dermatitis: Secondary | ICD-10-CM

## 2022-03-02 DIAGNOSIS — L299 Pruritus, unspecified: Secondary | ICD-10-CM | POA: Diagnosis not present

## 2022-03-02 DIAGNOSIS — M0600 Rheumatoid arthritis without rheumatoid factor, unspecified site: Secondary | ICD-10-CM | POA: Diagnosis not present

## 2022-03-02 DIAGNOSIS — R059 Cough, unspecified: Secondary | ICD-10-CM

## 2022-03-02 DIAGNOSIS — F22 Delusional disorders: Secondary | ICD-10-CM
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
## 2022-03-03 ENCOUNTER — Other Ambulatory Visit: Payer: Self-pay | Admitting: Family Medicine

## 2022-03-03 DIAGNOSIS — R059 Cough, unspecified: Secondary | ICD-10-CM

## 2022-03-09 ENCOUNTER — Encounter: Payer: Medicare Other | Admitting: Family Medicine

## 2022-03-09 LAB — STRONGYLOIDES ANTIBODY: Strongyloides IgG Antibody, ELISA: NEGATIVE

## 2022-03-13 ENCOUNTER — Other Ambulatory Visit: Payer: Self-pay | Admitting: Family Medicine

## 2022-03-13 NOTE — Telephone Encounter (Signed)
Name of Medication: Oxycodone Name of Pharmacy: [facility]  Last [place] or Written Date and Quantity: 02-17-22 #90 Last Office Visit and Type: 12-26-21 Next Office Visit and Type: 03-24-22 Last Controlled Substance Agreement Date: 09-06-20 Last UDS: 12-26-21  Diazepam #60 02-17-22  Alprazolam #5 02-17-22

## 2022-03-13 NOTE — Telephone Encounter (Signed)
Name of Medication: Alprazolam, Diazepam, Oxycodone-APAP Name of Pharmacy: [facility] Last [place] or Written Date and Quantity: 02/17/22      Alprazolam- #5      Diazepam- #60      Oxycodone-APAP- #90 Last Office Visit and Type: 12/26/21, f/u Next Office Visit and Type: 03/24/22, CPE Last Controlled Substance Agreement Date: 12/26/21 Last UDS: 12/26/21

## 2022-03-16 NOTE — Telephone Encounter (Signed)
ERx 

## 2022-03-18 ENCOUNTER — Other Ambulatory Visit: Payer: Self-pay | Admitting: Family Medicine

## 2022-03-20 NOTE — Telephone Encounter (Signed)
ERx 

## 2022-03-20 NOTE — Telephone Encounter (Signed)
Last filled:  02/18/22  Linzess- #30 Acyclovir- #60 Nexium- #90 Allopurinol- #90 Robaxin- #60 Last OV:  12/26/21, pain mgmt f/u Next OV:  04/06/22, pain mgmt f/u

## 2022-03-24 ENCOUNTER — Encounter: Payer: Medicare Other | Admitting: Family Medicine

## 2022-04-06 ENCOUNTER — Ambulatory Visit: Payer: Medicare Other | Admitting: Family Medicine

## 2022-04-13 ENCOUNTER — Encounter: Payer: Self-pay | Admitting: Emergency Medicine

## 2022-04-13 ENCOUNTER — Observation Stay
Admission: EM | Admit: 2022-04-13 | Discharge: 2022-04-14 | Disposition: A | Discharge status: Home / Self Care | Payer: Medicare Other | Attending: Internal Medicine | Admitting: Internal Medicine

## 2022-04-13 ENCOUNTER — Emergency Department: Payer: Medicare Other

## 2022-04-13 ENCOUNTER — Other Ambulatory Visit: Payer: Self-pay

## 2022-04-13 DIAGNOSIS — E663 Overweight: Secondary | ICD-10-CM | POA: Diagnosis present

## 2022-04-13 DIAGNOSIS — E785 Hyperlipidemia, unspecified: Secondary | ICD-10-CM | POA: Diagnosis present

## 2022-04-13 DIAGNOSIS — Z79899 Other long term (current) drug therapy: Secondary | ICD-10-CM | POA: Insufficient documentation

## 2022-04-13 DIAGNOSIS — L03116 Cellulitis of left lower limb: Secondary | ICD-10-CM | POA: Insufficient documentation

## 2022-04-13 DIAGNOSIS — E876 Hypokalemia: Secondary | ICD-10-CM | POA: Diagnosis not present

## 2022-04-13 DIAGNOSIS — F22 Delusional disorders: Principal | ICD-10-CM | POA: Insufficient documentation

## 2022-04-13 DIAGNOSIS — K760 Fatty (change of) liver, not elsewhere classified: Secondary | ICD-10-CM | POA: Diagnosis present

## 2022-04-13 DIAGNOSIS — S81802A Unspecified open wound, left lower leg, initial encounter: Secondary | ICD-10-CM | POA: Insufficient documentation

## 2022-04-13 DIAGNOSIS — R21 Rash and other nonspecific skin eruption: Secondary | ICD-10-CM | POA: Insufficient documentation

## 2022-04-13 DIAGNOSIS — T148XXA Other injury of unspecified body region, initial encounter: Secondary | ICD-10-CM

## 2022-04-13 DIAGNOSIS — M726 Necrotizing fasciitis: Secondary | ICD-10-CM | POA: Diagnosis not present

## 2022-04-13 DIAGNOSIS — E669 Obesity, unspecified: Secondary | ICD-10-CM | POA: Diagnosis present

## 2022-04-13 DIAGNOSIS — W57XXXS Bitten or stung by nonvenomous insect and other nonvenomous arthropods, sequela: Secondary | ICD-10-CM | POA: Diagnosis not present

## 2022-04-13 DIAGNOSIS — S81802S Unspecified open wound, left lower leg, sequela: Secondary | ICD-10-CM

## 2022-04-13 DIAGNOSIS — F411 Generalized anxiety disorder: Secondary | ICD-10-CM | POA: Diagnosis present

## 2022-04-13 DIAGNOSIS — G894 Chronic pain syndrome: Secondary | ICD-10-CM | POA: Diagnosis present

## 2022-04-13 DIAGNOSIS — M7989 Other specified soft tissue disorders: Secondary | ICD-10-CM | POA: Diagnosis not present

## 2022-04-13 DIAGNOSIS — I10 Essential (primary) hypertension: Secondary | ICD-10-CM | POA: Diagnosis not present

## 2022-04-13 DIAGNOSIS — F319 Bipolar disorder, unspecified: Secondary | ICD-10-CM | POA: Diagnosis present

## 2022-04-13 DIAGNOSIS — Z87891 Personal history of nicotine dependence: Secondary | ICD-10-CM | POA: Insufficient documentation

## 2022-04-13 DIAGNOSIS — J45909 Unspecified asthma, uncomplicated: Secondary | ICD-10-CM | POA: Diagnosis not present

## 2022-04-13 DIAGNOSIS — K219 Gastro-esophageal reflux disease without esophagitis: Secondary | ICD-10-CM | POA: Diagnosis present

## 2022-04-13 LAB — COMPREHENSIVE METABOLIC PANEL
ALT: 19 U/L (ref 0–44)
AST: 24 U/L (ref 15–41)
Albumin: 3.9 g/dL (ref 3.5–5.0)
Alkaline Phosphatase: 75 U/L (ref 38–126)
Anion gap: 10 (ref 5–15)
BUN: 9 mg/dL (ref 8–23)
CO2: 28 mmol/L (ref 22–32)
Calcium: 8.8 mg/dL — ABNORMAL LOW (ref 8.9–10.3)
Chloride: 102 mmol/L (ref 98–111)
Creatinine, Ser: 0.88 mg/dL (ref 0.44–1.00)
GFR, Estimated: >60 mL/min (ref >60)
Glucose, Bld: 96 mg/dL (ref 70–99)
Potassium: 2.9 mmol/L — ABNORMAL LOW (ref 3.5–5.1)
Sodium: 140 mmol/L (ref 135–145)
Total Bilirubin: 0.6 mg/dL (ref 0.3–1.2)
Total Protein: 7.6 g/dL (ref 6.5–8.1)

## 2022-04-13 LAB — CBC WITH DIFFERENTIAL/PLATELET
Abs Immature Granulocytes: 0 10*3/uL (ref 0.00–0.07)
Basophils Absolute: 0 10*3/uL (ref 0.0–0.1)
Basophils Relative: 1 %
Eosinophils Absolute: 0.2 10*3/uL (ref 0.0–0.5)
Eosinophils Relative: 2 %
HCT: 40.4 % (ref 36.0–46.0)
Hemoglobin: 13.2 g/dL (ref 12.0–15.0)
Immature Granulocytes: 0 %
Lymphocytes Relative: 37 %
Lymphs Abs: 2.7 10*3/uL (ref 0.7–4.0)
MCH: 29.1 pg (ref 26.0–34.0)
MCHC: 32.7 g/dL (ref 30.0–36.0)
MCV: 89.2 fL (ref 80.0–100.0)
Monocytes Absolute: 0.7 10*3/uL (ref 0.1–1.0)
Monocytes Relative: 9 %
Neutro Abs: 3.7 10*3/uL (ref 1.7–7.7)
Neutrophils Relative %: 51 %
Platelets: 299 10*3/uL (ref 150–400)
RBC: 4.53 MIL/uL (ref 3.87–5.11)
RDW: 12.4 % (ref 11.5–15.5)
WBC: 7.3 10*3/uL (ref 4.0–10.5)
nRBC: 0 % (ref 0.0–0.2)

## 2022-04-13 LAB — URINALYSIS, ROUTINE W REFLEX MICROSCOPIC
Bilirubin Urine: NEGATIVE
Glucose, UA: NEGATIVE mg/dL
Ketones, ur: 5 mg/dL — AB
Nitrite: NEGATIVE
Protein, ur: 30 mg/dL — AB
Specific Gravity, Urine: 1.03 (ref 1.005–1.030)
pH: 5 (ref 5.0–8.0)

## 2022-04-13 LAB — LACTIC ACID, PLASMA
Lactic Acid, Venous: 1.6 mmol/L (ref 0.5–1.9)
Lactic Acid, Venous: 0.8 mmol/L (ref 0.5–1.9)

## 2022-04-13 LAB — MAGNESIUM: Magnesium: 2.1 mg/dL (ref 1.7–2.4)

## 2022-04-13 LAB — SEDIMENTATION RATE: Sed Rate: 18 mm/hr (ref 0–22)

## 2022-04-13 LAB — PROTIME-INR
INR: 1 (ref 0.8–1.2)
Prothrombin Time: 13.5 seconds (ref 11.4–15.2)

## 2022-04-13 MED ORDER — ENOXAPARIN SODIUM 40 MG/0.4ML IJ SOSY
40.0000 mg | PREFILLED_SYRINGE | INTRAMUSCULAR | Status: DC
  Administered 2022-04-13: 40 mg via SUBCUTANEOUS
  Filled 2022-04-13 (×2): qty 0.4

## 2022-04-13 MED ORDER — ACETAMINOPHEN 650 MG RE SUPP: 650.0000 mg | Freq: Four times a day (QID) | RECTAL | Status: DC | PRN

## 2022-04-13 MED ORDER — ONDANSETRON HCL 4 MG PO TABS: 4.0000 mg | ORAL_TABLET | Freq: Four times a day (QID) | ORAL | Status: DC | PRN

## 2022-04-13 MED ORDER — ACETAMINOPHEN 325 MG PO TABS: 650.0000 mg | ORAL_TABLET | Freq: Four times a day (QID) | ORAL | Status: DC | PRN

## 2022-04-13 MED ORDER — ALBUTEROL SULFATE (2.5 MG/3ML) 0.083% IN NEBU: 3.0000 mL | INHALATION_SOLUTION | RESPIRATORY_TRACT | Status: DC | PRN

## 2022-04-13 MED ORDER — HALOPERIDOL LACTATE 5 MG/ML IJ SOLN
2.5000 mg | Freq: Once | INTRAMUSCULAR | Status: AC
  Administered 2022-04-13: 2.5 mg via INTRAVENOUS
  Filled 2022-04-13: qty 1

## 2022-04-13 MED ORDER — INFLUENZA VAC A&B SA ADJ QUAD 0.5 ML IM PRSY
0.5000 mL | PREFILLED_SYRINGE | INTRAMUSCULAR | Status: DC
  Filled 2022-04-13: qty 0.5

## 2022-04-13 MED ORDER — ONDANSETRON HCL 4 MG/2ML IJ SOLN: 4.0000 mg | Freq: Four times a day (QID) | INTRAMUSCULAR | Status: DC | PRN

## 2022-04-13 MED ORDER — POTASSIUM CHLORIDE CRYS ER 20 MEQ PO TBCR
20.0000 meq | EXTENDED_RELEASE_TABLET | Freq: Once | ORAL | Status: AC
  Administered 2022-04-13: 20 meq via ORAL
  Filled 2022-04-13 (×2): qty 1

## 2022-04-13 MED ORDER — SODIUM CHLORIDE 0.9 % IV SOLN
2.0000 g | Freq: Once | INTRAVENOUS | Status: AC
  Administered 2022-04-13: 2 g via INTRAVENOUS
  Filled 2022-04-13: qty 20

## 2022-04-13 MED ORDER — SODIUM CHLORIDE 0.9 % IV SOLN
2.0000 g | INTRAVENOUS | Status: DC
  Filled 2022-04-13: qty 20

## 2022-04-13 MED ORDER — ATORVASTATIN CALCIUM 10 MG PO TABS: 10.0000 mg | ORAL_TABLET | Freq: Every day | ORAL | Status: DC

## 2022-04-13 MED ORDER — VANCOMYCIN HCL 2000 MG/400ML IV SOLN
2000.0000 mg | Freq: Once | INTRAVENOUS | Status: AC
  Administered 2022-04-13: 2000 mg via INTRAVENOUS
  Filled 2022-04-13: qty 400

## 2022-04-13 MED ORDER — SENNOSIDES-DOCUSATE SODIUM 8.6-50 MG PO TABS: 1.0000 | ORAL_TABLET | Freq: Every evening | ORAL | Status: DC | PRN

## 2022-04-13 MED ORDER — HALOPERIDOL LACTATE 5 MG/ML IJ SOLN: 2.0000 mg | Freq: Four times a day (QID) | INTRAMUSCULAR | Status: DC | PRN

## 2022-04-13 MED ORDER — POTASSIUM CHLORIDE CRYS ER 20 MEQ PO TBCR
40.0000 meq | EXTENDED_RELEASE_TABLET | Freq: Once | ORAL | Status: AC
  Administered 2022-04-13: 40 meq via ORAL
  Filled 2022-04-13: qty 2

## 2022-04-13 MED ORDER — DIPHENHYDRAMINE HCL 50 MG/ML IJ SOLN
25.0000 mg | Freq: Once | INTRAMUSCULAR | Status: AC
  Administered 2022-04-13: 25 mg via INTRAVENOUS
  Filled 2022-04-13: qty 1

## 2022-04-13 MED ORDER — FLUTICASONE PROPIONATE 50 MCG/ACT NA SUSP: 2.0000 | Freq: Every day | NASAL | Status: DC | PRN

## 2022-04-13 MED ORDER — VANCOMYCIN HCL 1250 MG/250ML IV SOLN: 1250.0000 mg | INTRAVENOUS | Status: DC

## 2022-04-13 MED ORDER — LORAZEPAM 2 MG/ML IJ SOLN: 1.0000 mg | Freq: Four times a day (QID) | INTRAMUSCULAR | Status: DC | PRN

## 2022-04-13 NOTE — Consult Note (Signed)
PHARMACY -  BRIEF ANTIBIOTIC NOTE   Pharmacy has received consult(s) for cellulitis from an ED provider.  The patient's profile has been reviewed for ht/wt/allergies/indication/available labs.    One time order(s) placed for vancomycin  Further antibiotics/pharmacy consults should be ordered by admitting physician if indicated.                       Thank you, Oswald Hillock 04/13/2022  6:57 PM
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
## 2022-04-13 NOTE — Assessment & Plan Note (Addendum)
-   Patient has history of necrotizing fasciitis with debridement of the same wound

## 2022-04-13 NOTE — Assessment & Plan Note (Signed)
-   Lorazepam 1 mg IV Q6h as needed for anxiety, 3 doses ordered
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
## 2022-04-13 NOTE — ED Triage Notes (Signed)
Pt states she has had parasites for 4 years but have gotten worse over the past 3 months. Pt states worms are clear and in eyes, on skin. Pt states gain 53 lbs in 2 months. Pt states urine is thick and ozzing due with worms. Pt has wound on left ankle that has been treated for past 1year.   Manuela Schwartz, PA in room to assess during triage.

## 2022-04-13 NOTE — Assessment & Plan Note (Signed)
-   Inpatient behavioral health has been consulted

## 2022-04-13 NOTE — Assessment & Plan Note (Signed)
-   Inpatient behavioral health has been consulted 

## 2022-04-13 NOTE — Assessment & Plan Note (Addendum)
-   Patient picks at the wound frequently in an attempt to remove 'worms' - MRSA screening ordered - Blood cultures x2 have been collected and are in process - Continue with ceftriaxone 2 g IV and vancomycin per pharmacy

## 2022-04-13 NOTE — ED Provider Notes (Signed)
[facility] Provider Note    Event Date/Time   First MD Initiated Contact with Patient 04/13/22 1817     (approximate)   History   Insect Bite   HPI  Kaylee [name] is a 61 y.o. female  here with multiple complaints. Primary complaint is worsening pain in her L leg where she has an open wound, which has been a chronic issue related to her h/o nec fasc but also recurrent skin breakdown from delusional parisitosis. Pt reports the wound has been more painful, red, and draining over past week. She has also felt like the parasites have "worsened" and she describes feeling them throughout her hair, eyes, scalp, body, and legs. This seems to be a somewhat chronic issue. She also reports that she was "poisoned" with bugs by neighbors that "don't want her around." Denies any HI, SI. She has been taking her medications. Reports she has had fevers up to 102 at home.       Physical Exam   Triage Vital Signs: ED Triage Vitals  Enc Vitals Group     BP 04/13/22 1715 (!) 139/100     Pulse Rate 04/13/22 1715 96     Resp 04/13/22 1715 18     Temp 04/13/22 1715 98.1 F (36.7 C)     Temp Source 04/13/22 1715 Oral     SpO2 04/13/22 1715 97 %     Weight 04/13/22 1716 207 lb 14.3 oz (94.3 kg)     Height 04/13/22 1716 5\' 5"  (1.651 m)     Head Circumference --      Peak Flow --      Pain Score 04/13/22 1716 7     Pain Loc --      Pain Edu? --      Excl. in [place]? --     Most recent vital signs: Vitals:   04/13/22 1835 04/13/22 1957  BP:  (!) 142/77  Pulse: 99 66  Resp:  16  Temp:  98.3 F (36.8 C)  SpO2: 97% 96%     General: Awake, anxious-appearing, attempting to pick at "worms" in her hair and skin. CV:  Good peripheral perfusion. Tachycardic. Resp:  Normal effort. Lungs clear bilaterally. Abd:  No distention. No tenderness. Other:  Open, draining wound to LLE along prior lower fasciotomy site, with purulent base and some surrounding warmth and induration.  No visible parasites/worms or other infestations.   ED Results / Procedures / Treatments   Labs (all labs ordered are listed, but only abnormal results are displayed) Labs Reviewed  COMPREHENSIVE METABOLIC PANEL - Abnormal; Notable for the following components:      Result Value   Potassium 2.9 (*)    Calcium 8.8 (*)    All other components within normal limits  URINALYSIS, ROUTINE W REFLEX MICROSCOPIC - Abnormal; Notable for the following components:   Color, Urine AMBER (*)    APPearance CLOUDY (*)    Hgb urine dipstick SMALL (*)    Ketones, ur 5 (*)    Protein, ur 30 (*)    Leukocytes,Ua TRACE (*)    Bacteria, UA RARE (*)    Non Squamous Epithelial PRESENT (*)    All other components within normal limits  CULTURE, BLOOD (ROUTINE X 2)  CULTURE, BLOOD (ROUTINE X 2)  LACTIC ACID, PLASMA  CBC WITH DIFFERENTIAL/PLATELET  PROTIME-INR  LACTIC ACID, PLASMA     EKG    RADIOLOGY Tib/Fib: Negative   I also independently reviewed and  agree with radiologist interpretations.   PROCEDURES:  Critical Care performed: No  .1-3 Lead EKG Interpretation  Performed by: Shaune Pollack, MD Authorized by: Shaune Pollack, MD     Interpretation: normal     ECG rate:  90-110   ECG rate assessment: tachycardic     Rhythm: sinus rhythm     Ectopy: none     Conduction: normal   Comments:     Indication: Sepsis     MEDICATIONS ORDERED IN ED: Medications  vancomycin (VANCOREADY) IVPB 2000 mg/400 mL (2,000 mg Intravenous New Bag/Given 04/13/22 1957)  potassium chloride SA (KLOR-CON M) CR tablet 40 mEq (40 mEq Oral Given 04/13/22 1853)  cefTRIAXone (ROCEPHIN) 2 g in sodium chloride 0.9 % 100 mL IVPB (0 g Intravenous Stopped 04/13/22 1953)  haloperidol lactate (HALDOL) injection 2.5 mg (2.5 mg Intravenous Given 04/13/22 1853)  diphenhydrAMINE (BENADRYL) injection 25 mg (25 mg Intravenous Given 04/13/22 1853)     IMPRESSION / MDM / ASSESSMENT AND PLAN / ED COURSE  I reviewed the  triage vital signs and the nursing notes.                               The patient is on the cardiac monitor to evaluate for evidence of arrhythmia and/or significant heart rate changes.   Ddx:  Differential includes the following, with pertinent life- or limb-threatening emergencies considered:  Cellulitis, erysipelas, delusional parisitosis, mania, medication effect  Patient's presentation is most consistent with acute presentation with potential threat to life or bodily function.  MDM:  61 yo F with h/o delusional parasitosis, bipolar d/o, recurrent cellulitis, here with multiple complaints but primarily fever, worsening redness and pain to L leg wound. Exam as above, concerning for acute on chronic cellulitis. Wound somewhat chronically has a fibrinous base per images in past but redness is above baseline and warmth as well, with fevers. No crepitance, pt does not appear toxic/septic or concerning for nec fasc. Labs show mild hypokalemia, repleted. CBC without leukocytosis. LA is normal. UA shows possible UTI and pt endorses "mucus" in her urine, though she states this is the parasites.   Will cover broadly, admit to medicine for ongoing management. She has c/o nausea as well, given dose of haldol/benadryl to help with this in addition to her delusions and itching.   MEDICATIONS GIVEN IN ED: Medications  vancomycin (VANCOREADY) IVPB 2000 mg/400 mL (2,000 mg Intravenous New Bag/Given 04/13/22 1957)  potassium chloride SA (KLOR-CON M) CR tablet 40 mEq (40 mEq Oral Given 04/13/22 1853)  cefTRIAXone (ROCEPHIN) 2 g in sodium chloride 0.9 % 100 mL IVPB (0 g Intravenous Stopped 04/13/22 1953)  haloperidol lactate (HALDOL) injection 2.5 mg (2.5 mg Intravenous Given 04/13/22 1853)  diphenhydrAMINE (BENADRYL) injection 25 mg (25 mg Intravenous Given 04/13/22 1853)     Consults:  Hospitalist consulted for admission   EMR reviewed  Prior ED records, ID clinic note from August with Dr. Wyonia Hough     FINAL CLINICAL IMPRESSION(S) / ED DIAGNOSES   Final diagnoses:  Ekbom's delusional parasitosis (HCC)  Cellulitis of leg, left     Rx / DC Orders   ED Discharge Orders     None        Note:  This document was prepared using Dragon voice recognition software and may include unintentional dictation errors.   Shaune Pollack, MD 04/13/22 2006

## 2022-04-13 NOTE — Assessment & Plan Note (Signed)
-   Check magnesium - Status post potassium chloride 40 mill equivalent p.o. one-time dose

## 2022-04-13 NOTE — ED Triage Notes (Signed)
First Nurse Note:  Arrives stating "I have parasites all over my body.  I have the bugs and the worms on my legs".  States this has been ongoing for four years.  Statins "I understand that you cant always see it".  AAOx3.  Skin warm and dry. NAD. Calm and cooperative.

## 2022-04-13 NOTE — Assessment & Plan Note (Signed)
-   Atorvastatin 10 mg resumed

## 2022-04-13 NOTE — ED Notes (Signed)
Pt to c-pod from main ED. Pt awake and alert, ambulates from wheelchair to bed. Admitting MD at bedside

## 2022-04-13 NOTE — Hospital Course (Addendum)
Ms. Kaylee [name] is a 61 year old female bipolar 1, anxiety, trichotillomania, seronegative rheumatoid arthritis, homelessness, Ekbom's delusional parasitosis, hyperlipidemia, obesity, depression, GERD, chronic pain, who presents emergency department for chief concerns of left lower leg wound.  Initial vitals in the emergency department showed temperature 98.1, respiration rate of 18, heart rate of 96, blood pressure 139/100, SPO2 of 97% on room air.  Serum sodium is 140, potassium 2.9, chloride 102, bicarb 28, BUN of 9, serum creatinine 0.88, GFR greater than 60, nonfasting blood glucose 96, WBC 7.3, hemoglobin 13.2, platelets of 299.  Blood cultures x2 have been collected and are in process.  Lactic acid was 1.2.  UA showed trace leukocytes  ED treatment: Diphenhydramine 25 mg IV, Haldol 2.5 mg IV, potassium chloride 40 mEq p.o. one-time dose, ceftriaxone 2 g IV, vancomycin per pharmacy.

## 2022-04-13 NOTE — ED Notes (Signed)
This RN, Thedore Mins, RN and MD at bedside. Pt reports LLE graft x1 year ago than has been on and off infected with worms and bugs. Pt states neighbors sprinkled bug powder on her camper in an attempt to make her leave and since has been having worms in her legs, bugs in her hair, eyes and vagina. Pt picking at legs repeatedly.

## 2022-04-13 NOTE — ED Provider Triage Note (Signed)
Emergency Medicine Provider Triage Evaluation Note  Kaylee [name] , a 61 y.o. female  was evaluated in triage.  Pt complains of fever, chills, left leg wound that is being followed at wound center.  Patient states she just knows she has parasites all over her that they are in her eyes and her urine etc.  States having abdominal pain and thinks it is from the parasites.  Review of Systems  Positive: Fever, abdominal pain Negative: SI/HI  Physical Exam  There were no vitals taken for this visit. Gen:   Awake, no distress   Resp:  Normal effort  MSK:   Moves extremities without difficulty, left lower extremity is very red and swollen, appears to be chronic wounds Other:    Medical Decision Making  Medically screening exam initiated at 5:15 PM.  Appropriate orders placed.  Kaylee [name] was informed that the remainder of the evaluation will be completed by another provider, this initial triage assessment does not replace that evaluation, and the importance of remaining in the ED until their evaluation is complete.     Kaylee Starks, PA-C 04/13/22 [phone]
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
## 2022-04-14 ENCOUNTER — Other Ambulatory Visit: Payer: Self-pay | Admitting: Family Medicine

## 2022-04-14 ENCOUNTER — Telehealth: Payer: Self-pay | Admitting: Family Medicine

## 2022-04-14 DIAGNOSIS — F22 Delusional disorders: Secondary | ICD-10-CM | POA: Diagnosis not present

## 2022-04-14 LAB — BASIC METABOLIC PANEL
Anion gap: 6 (ref 5–15)
BUN: 8 mg/dL (ref 8–23)
CO2: 26 mmol/L (ref 22–32)
Calcium: 8.3 mg/dL — ABNORMAL LOW (ref 8.9–10.3)
Chloride: 109 mmol/L (ref 98–111)
Creatinine, Ser: 0.73 mg/dL (ref 0.44–1.00)
GFR, Estimated: >60 mL/min (ref >60)
Glucose, Bld: 91 mg/dL (ref 70–99)
Potassium: 3.5 mmol/L (ref 3.5–5.1)
Sodium: 141 mmol/L (ref 135–145)

## 2022-04-14 LAB — CBC
HCT: 35.4 % — ABNORMAL LOW (ref 36.0–46.0)
Hemoglobin: 11.5 g/dL — ABNORMAL LOW (ref 12.0–15.0)
MCH: 29 pg (ref 26.0–34.0)
MCHC: 32.5 g/dL (ref 30.0–36.0)
MCV: 89.4 fL (ref 80.0–100.0)
Platelets: 241 10*3/uL (ref 150–400)
RBC: 3.96 MIL/uL (ref 3.87–5.11)
RDW: 12.4 % (ref 11.5–15.5)
WBC: 4.7 10*3/uL (ref 4.0–10.5)
nRBC: 0 % (ref 0.0–0.2)

## 2022-04-14 LAB — MRSA NEXT GEN BY PCR, NASAL: MRSA by PCR Next Gen: DETECTED — AB

## 2022-04-14 LAB — URINE DRUG SCREEN, QUALITATIVE (ARMC ONLY)
Amphetamines, Ur Screen: POSITIVE — AB
Barbiturates, Ur Screen: NOT DETECTED
Benzodiazepine, Ur Scrn: POSITIVE — AB
Cannabinoid 50 Ng, Ur ~~LOC~~: NOT DETECTED
Cocaine Metabolite,Ur ~~LOC~~: NOT DETECTED
MDMA (Ecstasy)Ur Screen: NOT DETECTED
Methadone Scn, Ur: NOT DETECTED
Opiate, Ur Screen: NOT DETECTED
Phencyclidine (PCP) Ur S: NOT DETECTED
Tricyclic, Ur Screen: NOT DETECTED

## 2022-04-14 LAB — HIV ANTIBODY (ROUTINE TESTING W REFLEX): HIV Screen 4th Generation wRfx: NONREACTIVE

## 2022-04-14 LAB — C-REACTIVE PROTEIN: CRP: 0.8 mg/dL (ref <1.0)

## 2022-04-14 MED ORDER — CEPHALEXIN 500 MG PO CAPS: 500.0000 mg | ORAL_CAPSULE | Freq: Three times a day (TID) | ORAL | 0 refills | Status: AC

## 2022-04-14 MED ORDER — DIAZEPAM 5 MG PO TABS: 10.0000 mg | ORAL_TABLET | Freq: Two times a day (BID) | ORAL | Status: DC

## 2022-04-14 MED ORDER — RISPERIDONE 0.5 MG PO TBDP
0.2500 mg | ORAL_TABLET | Freq: Two times a day (BID) | ORAL | Status: DC
  Filled 2022-04-14: qty 0.5

## 2022-04-14 MED ORDER — ALLOPURINOL 100 MG PO TABS
100.0000 mg | ORAL_TABLET | Freq: Every day | ORAL | Status: DC
  Filled 2022-04-14 (×2): qty 1

## 2022-04-14 MED ORDER — ALPRAZOLAM 1 MG PO TABS: 1.0000 mg | ORAL_TABLET | Freq: Every day | ORAL | Status: DC | PRN

## 2022-04-14 MED ORDER — ARIPIPRAZOLE 2 MG PO TABS: 20.0000 mg | ORAL_TABLET | Freq: Every day | ORAL | Status: DC

## 2022-04-14 MED ORDER — CITALOPRAM HYDROBROMIDE 10 MG PO TABS
10.0000 mg | ORAL_TABLET | Freq: Every day | ORAL | Status: DC
  Administered 2022-04-14: 10 mg via ORAL
  Filled 2022-04-14: qty 1

## 2022-04-14 MED ORDER — OXYCODONE-ACETAMINOPHEN 7.5-325 MG PO TABS: 1.0000 | ORAL_TABLET | Freq: Three times a day (TID) | ORAL | Status: DC | PRN

## 2022-04-14 MED ORDER — CEPHALEXIN 500 MG PO CAPS
500.0000 mg | ORAL_CAPSULE | Freq: Three times a day (TID) | ORAL | Status: DC
  Administered 2022-04-14: 500 mg via ORAL
  Filled 2022-04-14: qty 1

## 2022-04-14 MED ORDER — LINACLOTIDE 145 MCG PO CAPS: 145.0000 ug | ORAL_CAPSULE | Freq: Every day | ORAL | Status: DC

## 2022-04-14 MED ORDER — PANTOPRAZOLE SODIUM 40 MG PO TBEC
40.0000 mg | DELAYED_RELEASE_TABLET | Freq: Every day | ORAL | Status: DC
  Administered 2022-04-14: 40 mg via ORAL
  Filled 2022-04-14: qty 1

## 2022-04-14 MED ORDER — METHOCARBAMOL 500 MG PO TABS: 750.0000 mg | ORAL_TABLET | Freq: Two times a day (BID) | ORAL | Status: DC | PRN

## 2022-04-14 NOTE — Plan of Care (Signed)
?  Problem: Activity: ?Goal: Risk for activity intolerance will decrease ?Outcome: Progressing ?  ?Problem: Nutrition: ?Goal: Adequate nutrition will be maintained ?Outcome: Progressing ?  ?Problem: Elimination: ?Goal: Will not experience complications related to urinary retention ?Outcome: Progressing ?  ?Problem: Pain Managment: ?Goal: General experience of comfort will improve ?Outcome: Progressing ?  ?
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
## 2022-04-14 NOTE — Telephone Encounter (Signed)
Pt called in wanted to make her PCP aware that she was in the hospital for her left leg and a kidney infection

## 2022-04-14 NOTE — Discharge Instructions (Signed)
Dressing change per instructions
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
## 2022-04-14 NOTE — Telephone Encounter (Signed)
She has appt to see me next week. Please encourage her to keep that appointment.
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
## 2022-04-14 NOTE — Telephone Encounter (Signed)
Name of Medication: Alprazolam, Diazepam, Oxycodone-APAP Name of Pharmacy: [facility] or Written Date and Quantity: 03/17/22      Alprazolam- #5      Diazepam- #60      Oxycodone-APAP- #90 Last Office Visit and Type: 12/26/21, f/u Next Office Visit and Type: 04/21/22, med refill Last Controlled Substance Agreement Date: 12/26/21 Last UDS: 12/26/21

## 2022-04-14 NOTE — Consult Note (Signed)
[facility] Nurse Consult Note: Reason for Consult: Consult requested for left leg.  Performed remotely after review of progress notes and photos in the EMR.  Left lower calf with chronic full thickness wound. 100% red and moist with mod amt tan drainage.  3X2cm, according to measurements in the bedside nurse wound care flow sheet.  Dressing procedure/placement/frequency: Topical treatment orders provided for bedside nurses to perform to absorb drainage and provide antimicrobial benefits as follows: Apply a piece of Aquacel Kellie Simmering # [phone]) over left leg wound Q day, then cover with ABB pad and kerlex.  Moisten with NS each time to assist with removal of previous dressing. Please re-consult if further assistance is needed.  Thank-you,  Julien Girt MSN, [place], [place], [place], [place]

## 2022-04-15 NOTE — Telephone Encounter (Addendum)
Attempted to contact pt.  No answer.  Vm box full.  Need to inform pt, per Dr. Darnell Level, she needs to keep next OV for additional refills.

## 2022-04-15 NOTE — Telephone Encounter (Signed)
ERx She needs to keep f/u appt for more refills.

## 2022-04-15 NOTE — Telephone Encounter (Signed)
Attempted to contact pt.  (See 04/14/22 phn note.)

## 2022-04-17 NOTE — Telephone Encounter (Signed)
Called patient and encouraged to keep appt for 10/10. She states she will do her best to get here for the appointment. Patient states she is having a hard time financially right now.

## 2022-04-18 LAB — CULTURE, BLOOD (ROUTINE X 2)
Culture: NO GROWTH
Culture: NO GROWTH
Special Requests: ADEQUATE

## 2022-04-20 ENCOUNTER — Telehealth: Payer: Self-pay

## 2022-04-20 NOTE — Chronic Care Management (AMB) (Signed)
Chronic Care Management Pharmacy Assistant   Name: Kaylee [name]  MRN: [id number] DOB: [birth year]  Reason for Encounter: Hospital Follow Up   Medications: Outpatient Encounter Medications as of 04/20/2022  Medication Sig   acetaminophen (TYLENOL) 500 MG tablet Take 1,000 mg by mouth every 6 (six) hours as needed for mild pain.   acyclovir (ZOVIRAX) 400 MG tablet TAKE 1 TABLET BY MOUTH TWICE A DAY   ADVAIR DISKUS 250-50 MCG/ACT AEPB INHALE 1 PUFF INTO THE LUNGS TWICE DAILY   albuterol (VENTOLIN HFA) 108 (90 Base) MCG/ACT inhaler INHALE 2 PUFFS INTO THE LUNGS EVERY 4 HOURS AS NEEDED.   allopurinol (ZYLOPRIM) 100 MG tablet TAKE 1 TABLET BY MOUTH DAILY   ALPRAZolam (XANAX) 1 MG tablet TAKE 1 TABLET BY MOUTH DAILY AS NEEDED FOR ANXIETY   ARIPiprazole (ABILIFY) 20 MG tablet Take 1 tablet (20 mg total) by mouth daily.   atorvastatin (LIPITOR) 10 MG tablet TAKE 1 TABLET BY MOUTH DAILY   citalopram (CELEXA) 10 MG tablet TAKE 1 TABLET BY MOUTH DAILY   diazepam (VALIUM) 10 MG tablet TAKE 1 TABLET BY MOUTH TWICE A DAY   dicyclomine (BENTYL) 10 MG capsule TAKE 1 CAPSULE BY MOUTH 3 TIMES A DAY ASNEEDED FOR SPASMS. TAKE MEDICATION WITH MEALS.   esomeprazole (NEXIUM) 40 MG capsule TAKE 1 CAPSULE BY MOUTH DAILY   etodolac (LODINE) 400 MG tablet TAKE 1 TABLET BY MOUTH TWICE A DAY AS NEEDED FOR MODERATE PAIN   furosemide (LASIX) 40 MG tablet Take 1 tablet (40 mg total) by mouth daily.   LINZESS 290 MCG CAPS capsule TAKE 1 CAPSULE BY MOUTH DAILY BEFORE BREAKFAST   methocarbamol (ROBAXIN) 750 MG tablet TAKE 1 TABLET BY MOUTH TWICE A DAY AS NEEDED FOR MUSCLE SPASMS   omega-3 acid ethyl esters (LOVAZA) 1 g capsule TAKE 2 CAPSULES (2 GRAMS TOTAL) BY MOUTHDAILY   oxyCODONE-acetaminophen (PERCOCET) 7.5-325 MG tablet TAKE 1 TABLET BY MOUTH EVERY 8 HOURS AS NEEDED FOR PAIN   potassium chloride (KLOR-CON) 10 MEQ tablet TAKE 1 TABLET BY MOUTH DAILY   SPIRIVA HANDIHALER 18 MCG inhalation capsule INHALE ONE  CAPSULE AS DIRECTED ONCE A DAY   No facility-administered encounter medications on file as of 04/20/2022.   Reviewed hospital notes for details of recent visit. Has patient been contacted by Transitions of Care team? No Has patient seen PCP/specialist for hospital follow up (summarize OV if yes): No  Admitted to the ED on 04/13/22. Discharge date was 04/14/22.  Discharged from [facility].   Discharge diagnosis (Principal Problem): Ekbom's delusional parasitosis  Patient was discharged to Home  Brief summary of hospital course: 61 yo F with h/o delusional parasitosis, bipolar d/o, recurrent cellulitis, here with multiple complaints but primarily fever, worsening redness and pain to L leg wound. Exam as above, concerning for acute on chronic cellulitis. Wound somewhat chronically has a fibrinous base per images in past but redness is above baseline and warmth as well, with fevers. No crepitance, pt does not appear toxic/septic or concerning for nec fasc. Labs show mild hypokalemia, repleted. CBC without leukocytosis. LA is normal. UA shows possible UTI and pt endorses "mucus" in her urine, though she states this is the parasites. Will cover broadly, admit to medicine for ongoing management. She has c/o nausea as well, given dose of haldol/benadryl to help with this in addition to her delusions and itching  Medication Changes at Hospital Discharge: -Changed Cephalexin  Medications Discontinued at Hospital Discharge: -Stopped benedryl  Fluticasone  Lidoderm 5% patch  Macrodantin    Medications that remain the same after Hospital Discharge:??  -All other medications will remain the same.    Next CCM appt: none  Other upcoming appts: PCP appointment on 05/01/22  Charlene Brooke, PharmD notified and will determine if action is needed.  Avel Sensor, [place]  [phone]

## 2022-04-21 ENCOUNTER — Ambulatory Visit: Payer: Medicare Other | Admitting: Family Medicine

## 2022-05-01 ENCOUNTER — Ambulatory Visit: Payer: Medicare Other | Admitting: Family Medicine

## 2022-05-05 ENCOUNTER — Other Ambulatory Visit: Payer: Self-pay | Admitting: Family Medicine

## 2022-05-05 DIAGNOSIS — R059 Cough, unspecified: Secondary | ICD-10-CM

## 2022-05-06 ENCOUNTER — Encounter: Payer: Self-pay | Admitting: Family Medicine

## 2022-05-06 ENCOUNTER — Ambulatory Visit (INDEPENDENT_AMBULATORY_CARE_PROVIDER_SITE_OTHER): Payer: Medicare Other | Admitting: Family Medicine

## 2022-05-06 VITALS — BP 124/64 | HR 95 | Temp 97.6°F | Ht 65.0 in | Wt 205.5 lb

## 2022-05-06 DIAGNOSIS — G894 Chronic pain syndrome: Secondary | ICD-10-CM | POA: Diagnosis not present

## 2022-05-06 DIAGNOSIS — S81802A Unspecified open wound, left lower leg, initial encounter: Secondary | ICD-10-CM | POA: Diagnosis not present

## 2022-05-06 DIAGNOSIS — Z23 Encounter for immunization: Secondary | ICD-10-CM

## 2022-05-06 DIAGNOSIS — T148XXA Other injury of unspecified body region, initial encounter: Secondary | ICD-10-CM

## 2022-05-06 DIAGNOSIS — R5383 Other fatigue: Secondary | ICD-10-CM

## 2022-05-06 DIAGNOSIS — G8929 Other chronic pain: Secondary | ICD-10-CM | POA: Diagnosis not present

## 2022-05-06 DIAGNOSIS — R5381 Other malaise: Secondary | ICD-10-CM | POA: Diagnosis not present

## 2022-05-06 DIAGNOSIS — Z91199 Patient's noncompliance with other medical treatment and regimen due to unspecified reason: Secondary | ICD-10-CM | POA: Diagnosis not present

## 2022-05-06 DIAGNOSIS — F22 Delusional disorders: Secondary | ICD-10-CM

## 2022-05-06 DIAGNOSIS — M726 Necrotizing fasciitis: Secondary | ICD-10-CM

## 2022-05-06 DIAGNOSIS — R892 Abnormal level of other drugs, medicaments and biological substances in specimens from other organs, systems and tissues: Secondary | ICD-10-CM

## 2022-05-06 DIAGNOSIS — F319 Bipolar disorder, unspecified: Secondary | ICD-10-CM

## 2022-05-06 LAB — CBC WITH DIFFERENTIAL/PLATELET
Basophils Absolute: 0.1 10*3/uL (ref 0.0–0.1)
Basophils Relative: 0.8 % (ref 0.0–3.0)
Eosinophils Absolute: 0.2 10*3/uL (ref 0.0–0.7)
Eosinophils Relative: 3 % (ref 0.0–5.0)
HCT: 38.5 % (ref 36.0–46.0)
Hemoglobin: 12.5 g/dL (ref 12.0–15.0)
Lymphocytes Relative: 36.7 % (ref 12.0–46.0)
Lymphs Abs: 3 10*3/uL (ref 0.7–4.0)
MCHC: 32.6 g/dL (ref 30.0–36.0)
MCV: 87.9 fl (ref 78.0–100.0)
Monocytes Absolute: 0.6 10*3/uL (ref 0.1–1.0)
Monocytes Relative: 6.9 % (ref 3.0–12.0)
Neutro Abs: 4.3 10*3/uL (ref 1.4–7.7)
Neutrophils Relative %: 52.6 % (ref 43.0–77.0)
Platelets: 319 10*3/uL (ref 150.0–400.0)
RBC: 4.38 Mil/uL (ref 3.87–5.11)
RDW: 13.3 % (ref 11.5–15.5)
WBC: 8.2 10*3/uL (ref 4.0–10.5)

## 2022-05-06 LAB — BASIC METABOLIC PANEL
BUN: 19 mg/dL (ref 6–23)
CO2: 31 mEq/L (ref 19–32)
Calcium: 8.3 mg/dL — ABNORMAL LOW (ref 8.4–10.5)
Chloride: 100 mEq/L (ref 96–112)
Creatinine, Ser: 0.82 mg/dL (ref 0.40–1.20)
GFR: 76.62 mL/min (ref >60.00)
Glucose, Bld: 90 mg/dL (ref 70–99)
Potassium: 3.4 mEq/L — ABNORMAL LOW (ref 3.5–5.1)
Sodium: 140 mEq/L (ref 135–145)

## 2022-05-06 NOTE — Progress Notes (Signed)
Patient ID: Kaylee [name], female    DOB: 1960-10-18, 61 y.o.   MRN: [id number]  This visit was conducted in person.  BP 124/64   Pulse 95   Temp 97.6 F (36.4 C) (Temporal)   Ht 5\' 5"  (1.651 m)   Wt 205 lb 8 oz (93.2 kg)   SpO2 96%   BMI 34.20 kg/m    CC: chronic pain follow up - overdue  Subjective:   HPI: Kaylee [name] is a 61 y.o. female presenting on 05/06/2022 for Follow-up (Here for med f/u.)   H/o delusional parasitosis for the past 1+ year.  H/o severe necrotizing fascitis 01/2021 with prolonged hospitalization and chronic poorly healing LLE wound since then, likely precipitated by picking at skin due to above.  We started abilify and titrated to 20mg  daily - today she states she's stopped this medication as she felt it was contributing to sensation of skin crawling, states since stopping medication she no longer sees worms in skin.  She has refused to see psychiatrist.  She has not returned to wound clinic since 08/2021  Hospitalized again 10/2-09/2021 for worse pain/swelling of chronic LLE wound, records reviewed. MRSA screen positive, Blcx negative x2, treated with keflex 500mg  TID 5d course. Was prescribed #15 tablets. States she did not take due to nausea (did not let me know) but over the past few weeks has been taking 1-2 per day. She has 1 more pill of keflex left. Hypokalemia - potassium was repleted. She states she also had a kidney infection - I don't see records of this.   Continues vaseline gauze dressing change daily, sometimes uses triple abx ointment.   Chronic pain - continues oxycodone 7.5/325mg  2-3 tablets daily as needed #90/month for chronic back pain.  She continues celexa 10mg , valium BID with xanax PRN.  She stopped abilify 20mg  - states it was causing crawling sensation to skin. States she hasn't seen worms since stopping medication.   She's not been feeling well over the last several weeks - cough, congestion, headache, nausea, decreased  appetite.  No fever/chills, abd pain, vomiting or diarrhea. [name]  She's been taking 2 lasix daily for the past 3-4 weeks as she's noted decreased urine output. Feels she's staying well hydrated.     Relevant past medical, surgical, family and social history reviewed and updated as indicated. Interim medical history since our last visit reviewed. Allergies and medications reviewed and updated. Outpatient Medications Prior to Visit  Medication Sig Dispense Refill   acetaminophen (TYLENOL) 500 MG tablet Take 1,000 mg by mouth every 6 (six) hours as needed for mild pain.     acyclovir (ZOVIRAX) 400 MG tablet TAKE 1 TABLET BY MOUTH TWICE A DAY 60 tablet 3   ADVAIR DISKUS 250-50 MCG/ACT AEPB INHALE 1 PUFF INTO THE LUNGS TWICE DAILY 60 each 3   albuterol (VENTOLIN HFA) 108 (90 Base) MCG/ACT inhaler INHALE 2 PUFFS INTO THE LUNGS EVERY 4 HOURS AS NEEDED. 8.5 g 1   allopurinol (ZYLOPRIM) 100 MG tablet TAKE 1 TABLET BY MOUTH DAILY 90 tablet 1   ALPRAZolam (XANAX) 1 MG tablet TAKE 1 TABLET BY MOUTH DAILY AS NEEDED FOR ANXIETY 5 tablet 0   atorvastatin (LIPITOR) 10 MG tablet TAKE 1 TABLET BY MOUTH DAILY 90 tablet 3   citalopram (CELEXA) 10 MG tablet TAKE 1 TABLET BY MOUTH DAILY 90 tablet 1   diazepam (VALIUM) 10 MG tablet TAKE 1 TABLET BY MOUTH TWICE A DAY 60 tablet 0  dicyclomine (BENTYL) 10 MG capsule TAKE 1 CAPSULE BY MOUTH 3 TIMES A DAY ASNEEDED FOR SPASMS. TAKE MEDICATION WITH MEALS. 60 capsule 3   esomeprazole (NEXIUM) 40 MG capsule TAKE 1 CAPSULE BY MOUTH DAILY 90 capsule 1   etodolac (LODINE) 400 MG tablet TAKE 1 TABLET BY MOUTH TWICE A DAY AS NEEDED FOR MODERATE PAIN 60 tablet 3   furosemide (LASIX) 40 MG tablet Take 1 tablet (40 mg total) by mouth daily. 90 tablet 0   LINZESS 290 MCG CAPS capsule TAKE 1 CAPSULE BY MOUTH DAILY BEFORE BREAKFAST 30 capsule 3   methocarbamol (ROBAXIN) 750 MG tablet TAKE 1 TABLET BY MOUTH TWICE A DAY AS NEEDED FOR MUSCLE SPASMS 60 tablet 1   omega-3 acid ethyl esters  (LOVAZA) 1 g capsule TAKE 2 CAPSULES (2 GRAMS TOTAL) BY MOUTHDAILY 180 capsule 1   oxyCODONE-acetaminophen (PERCOCET) 7.5-325 MG tablet TAKE 1 TABLET BY MOUTH EVERY 8 HOURS AS NEEDED FOR PAIN 90 tablet 0   potassium chloride (KLOR-CON) 10 MEQ tablet TAKE 1 TABLET BY MOUTH DAILY 90 tablet 3   SPIRIVA HANDIHALER 18 MCG inhalation capsule INHALE ONE CAPSULE AS DIRECTED ONCE A DAY 90 capsule 0   ARIPiprazole (ABILIFY) 20 MG tablet Take 1 tablet (20 mg total) by mouth daily. 30 tablet 11   No facility-administered medications prior to visit.     Per HPI unless specifically indicated in ROS section below Review of Systems  Objective:  BP 124/64   Pulse 95   Temp 97.6 F (36.4 C) (Temporal)   Ht 5\' 5"  (1.651 m)   Wt 205 lb 8 oz (93.2 kg)   SpO2 96%   BMI 34.20 kg/m   Wt Readings from Last 3 Encounters:  05/06/22 205 lb 8 oz (93.2 kg)  04/13/22 207 lb 14.3 oz (94.3 kg)  03/02/22 208 lb (94.3 kg)      Physical Exam Vitals and nursing note reviewed.  Constitutional:      Appearance: Normal appearance. She is not ill-appearing.  HENT:     Head: Normocephalic and atraumatic.     Mouth/Throat:     Mouth: Mucous membranes are dry.     Pharynx: Oropharynx is clear. No oropharyngeal exudate or posterior oropharyngeal erythema.  Eyes:     Extraocular Movements: Extraocular movements intact.     Pupils: Pupils are equal, round, and reactive to light.  Cardiovascular:     Rate and Rhythm: Normal rate and regular rhythm.     Pulses: Normal pulses.     Heart sounds: Normal heart sounds. No murmur heard. Pulmonary:     Effort: Pulmonary effort is normal. No respiratory distress.     Breath sounds: Normal breath sounds. No wheezing, rhonchi or rales.  Musculoskeletal:        General: Swelling present.     Right lower leg: Edema present.     Left lower leg: No edema.  Skin:    General: Skin is warm and dry.     Findings: Wound present.          Comments: Chronic wound to medial RLE  with macerated edges  Neurological:     Mental Status: She is alert.  Psychiatric:        Mood and Affect: Mood normal.        Behavior: Behavior normal.        Results for orders placed or performed in visit on 05/06/22  CBC with Differential/Platelet  Result Value Ref Range   WBC 8.2 4.0 -  10.5 K/uL   RBC 4.38 3.87 - 5.11 Mil/uL   Hemoglobin 12.5 12.0 - 15.0 g/dL   HCT 66.4 40.3 - 47.4 %   MCV 87.9 78.0 - 100.0 fl   MCHC 32.6 30.0 - 36.0 g/dL   RDW 25.9 56.3 - 87.5 %   Platelets 319.0 150.0 - 400.0 K/uL   Neutrophils Relative % 52.6 43.0 - 77.0 %   Lymphocytes Relative 36.7 12.0 - 46.0 %   Monocytes Relative 6.9 3.0 - 12.0 %   Eosinophils Relative 3.0 0.0 - 5.0 %   Basophils Relative 0.8 0.0 - 3.0 %   Neutro Abs 4.3 1.4 - 7.7 K/uL   Lymphs Abs 3.0 0.7 - 4.0 K/uL   Monocytes Absolute 0.6 0.1 - 1.0 K/uL   Eosinophils Absolute 0.2 0.0 - 0.7 K/uL   Basophils Absolute 0.1 0.0 - 0.1 K/uL  Basic metabolic panel  Result Value Ref Range   Sodium 140 135 - 145 mEq/L   Potassium 3.4 (L) 3.5 - 5.1 mEq/L   Chloride 100 96 - 112 mEq/L   CO2 31 19 - 32 mEq/L   Glucose, Bld 90 70 - 99 mg/dL   BUN 19 6 - 23 mg/dL   Creatinine, Ser 6.43 0.40 - 1.20 mg/dL   GFR 32.95 >18.84 mL/min   Calcium 8.3 (L) 8.4 - 10.5 mg/dL    Assessment & Plan:   Problem List Items Addressed This Visit     Encounter for chronic pain management - Primary (Chronic)    Update UDS. If + amphetamine, send D/L isomers to r/o MDMA use. She continues valium bid, xanax PRN, and oxycodone 7.5/325 2-3 tab daily PRN.       Relevant Orders   DRUG MONITORING, PANEL 8 WITH CONFIRMATION, URINE   Chronic pain syndrome   Abnormal drug screen    Update UDS today if positive amphetamines, run D/L isomers.       Bipolar 1 disorder (HCC)    She self stopped abilify.  Has declined psychiatry evaluation.       Necrotizing fasciitis of lower leg (HCC)   Ekbom's delusional parasitosis (HCC)    She stopped abilify,  states delusion of parasites has stopped since off abilify. Declines restarting this.  She states she saw a female provider 1 month ago who advised she stop abilify and was supposed to contact me to recommend another treatment. She doesn't know who this was, I have no records of this evaluation.       Relevant Orders   Ambulatory referral to Wound Clinic   Chronic wound    Chronic poorly healing L leg wound after LLE necrotizing fascitis 01/2021. She continues daily dressing changes at home with vaseline gauze and topical triple antibiotic ointment.  Wound redressed with vaseline gauze.  She states she was discharged by wound clinic. I recommend return to wound clinic for chronically poorly healing wound.  She agrees to go to [facility] wound clinic - referral placed.       Relevant Orders   Ambulatory referral to Wound Clinic   CBC with Differential/Platelet (Completed)   Basic metabolic panel (Completed)   Patient nonadherence   Malaise and fatigue    Has not been feeling well for the past several weeks.  She self increased lasix to 40mg  daily for the past several weeks.  She appears clinically dry today - concern for dehydration.  Advised against self-titration of medications. Check labwork today to r/o AKI/hypokalemia.       Other  Visit Diagnoses     Need for influenza vaccination       Relevant Orders   Flu Vaccine QUAD 20mo+IM (Fluarix, Fluzone & Alfiuria Quad PF) (Completed)        No orders of the defined types were placed in this encounter.  Orders Placed This Encounter  Procedures   Flu Vaccine QUAD 50mo+IM (Fluarix, Fluzone & Alfiuria Quad PF)   CBC with Differential/Platelet   Basic metabolic panel   DRUG MONITORING, PANEL 8 WITH CONFIRMATION, URINE   Ambulatory referral to Wound Clinic    Referral Priority:   Routine    Referral Type:   Consultation    Referral Reason:   Specialty Services Required    Requested Specialty:   Wound Care    Number of Visits  Requested:   1     Patient Instructions  Flu shot today Labs and urine test today.  I will refer you back to wound clinic for poorly healing wound.  Keep December appointment.   Follow up plan: No follow-ups on file.  [name], MD

## 2022-05-06 NOTE — Patient Instructions (Addendum)
Flu shot today Labs and urine test today.  I will refer you back to wound clinic for poorly healing wound.  Keep December appointment.

## 2022-05-07 DIAGNOSIS — Z91199 Patient's noncompliance with other medical treatment and regimen due to unspecified reason: Secondary | ICD-10-CM | POA: Insufficient documentation

## 2022-05-07 DIAGNOSIS — R5381 Other malaise: Secondary | ICD-10-CM | POA: Insufficient documentation

## 2022-05-07 NOTE — Assessment & Plan Note (Signed)
She stopped abilify, states delusion of parasites has stopped since off abilify. Declines restarting this.  She states she saw a female provider 1 month ago who advised she stop abilify and was supposed to contact me to recommend another treatment. She doesn't know who this was, I have no records of this evaluation.

## 2022-05-07 NOTE — Assessment & Plan Note (Signed)
Update UDS today if positive amphetamines, run D/L isomers.

## 2022-05-07 NOTE — Assessment & Plan Note (Addendum)
Update UDS. If + amphetamine, send D/L isomers to r/o MDMA use. She continues valium bid, xanax PRN, and oxycodone 7.5/325 2-3 tab daily PRN.

## 2022-05-07 NOTE — Assessment & Plan Note (Signed)
Has not been feeling well for the past several weeks.  She self increased lasix to 40mg  daily for the past several weeks.  She appears clinically dry today - concern for dehydration.  Advised against self-titration of medications. Check labwork today to r/o AKI/hypokalemia.

## 2022-05-07 NOTE — Assessment & Plan Note (Signed)
She self stopped abilify.  Has declined psychiatry evaluation.

## 2022-05-07 NOTE — Assessment & Plan Note (Signed)
Chronic poorly healing L leg wound after LLE necrotizing fascitis 01/2021. She continues daily dressing changes at home with vaseline gauze and topical triple antibiotic ointment.  Wound redressed with vaseline gauze.  She states she was discharged by wound clinic. I recommend return to wound clinic for chronically poorly healing wound.  She agrees to go to [facility] wound clinic - referral placed.

## 2022-05-07 NOTE — Telephone Encounter (Signed)
Bentyl Last filled:  03/18/22, #60 Last OV:  05/06/22, chronic pain f/u Next OV:  07/01/22, CPE

## 2022-05-09 LAB — DRUG MONITORING, PANEL 8 WITH CONFIRMATION, URINE
6 Acetylmorphine: NEGATIVE ng/mL (ref <10)
Alcohol Metabolites: POSITIVE ng/mL — AB (ref <500)
Alphahydroxyalprazolam: NEGATIVE ng/mL (ref <25)
Alphahydroxymidazolam: NEGATIVE ng/mL (ref <50)
Alphahydroxytriazolam: NEGATIVE ng/mL (ref <50)
Amphetamines: NEGATIVE ng/mL (ref <500)
Benzodiazepines: POSITIVE ng/mL — AB (ref <100)
Buprenorphine, Urine: NEGATIVE ng/mL (ref <5)
Cocaine Metabolite: NEGATIVE ng/mL (ref <150)
Codeine: NEGATIVE ng/mL (ref <50)
Creatinine: 44.3 mg/dL (ref >=20.0)
Ethyl Glucuronide (ETG): >10000 ng/mL — ABNORMAL HIGH (ref <500)
Ethyl Sulfate (ETS): 3025 ng/mL — ABNORMAL HIGH (ref <100)
Hydrocodone: NEGATIVE ng/mL (ref <50)
Hydromorphone: NEGATIVE ng/mL (ref <50)
Hydroxyethylflurazepam: NEGATIVE ng/mL (ref <50)
Lorazepam: NEGATIVE ng/mL (ref <50)
MDMA: NEGATIVE ng/mL (ref <500)
Marijuana Metabolite: NEGATIVE ng/mL (ref <20)
Morphine: NEGATIVE ng/mL (ref <50)
Nordiazepam: 102 ng/mL — ABNORMAL HIGH (ref <50)
Norhydrocodone: NEGATIVE ng/mL (ref <50)
Noroxycodone: 575 ng/mL — ABNORMAL HIGH (ref <50)
Opiates: NEGATIVE ng/mL (ref <100)
Oxidant: NEGATIVE ug/mL (ref <200)
Oxycodone: 1017 ng/mL — ABNORMAL HIGH (ref <50)
Oxycodone: POSITIVE ng/mL — AB (ref <100)
Oxymorphone: 811 ng/mL — ABNORMAL HIGH (ref <50)
Temazepam: 256 ng/mL — ABNORMAL HIGH (ref <50)
pH: 6.6 (ref 4.5–9.0)

## 2022-05-09 LAB — DM TEMPLATE

## 2022-05-18 ENCOUNTER — Ambulatory Visit: Payer: Medicare Other | Admitting: Internal Medicine

## 2022-05-21 ENCOUNTER — Other Ambulatory Visit: Payer: Self-pay | Admitting: Family Medicine

## 2022-05-21 NOTE — Telephone Encounter (Signed)
Name of Medication: Alprazolam, Diazepam, Oxycodone-APAP Name of Pharmacy: [facility] Last [place] or Written Date and Quantity: 04/15/22      Alprazolam- #5      Diazepam- #60      Oxycodone-APAP- #90 Last Office Visit and Type: 05/06/22, chronic pain f/u Next Office Visit and Type: 07/01/22, CPE Last Controlled Substance Agreement Date: 12/26/21 Last UDS: 12/26/21

## 2022-05-22 NOTE — Telephone Encounter (Signed)
ERx 

## 2022-06-16 ENCOUNTER — Other Ambulatory Visit: Payer: Self-pay | Admitting: Family Medicine

## 2022-06-16 NOTE — Telephone Encounter (Signed)
Name of Medication: Alprazolam, Diazepam, Oxycodone-APAP Name of Pharmacy: [facility] Last [place] or Written Date and Quantity: 05/23/22      Alprazolam- #5      Diazepam- #60      Oxycodone-APAP- #90 Last Office Visit and Type: 05/06/22, med f/u Next Office Visit and Type: 07/01/22, CPE Last Controlled Substance Agreement Date: 12/26/21 Last UDS: 05/06/22

## 2022-06-17 ENCOUNTER — Other Ambulatory Visit: Payer: Self-pay | Admitting: Family Medicine

## 2022-06-17 NOTE — Telephone Encounter (Signed)
ERx 

## 2022-06-18 NOTE — Telephone Encounter (Signed)
Last OV: 05/06/22, chronic pain f/u Next OV:  07/01/22, CPE

## 2022-06-19 NOTE — Telephone Encounter (Signed)
ERx 1 month of daily nitrofurantoin refilled. Will need UA at appt in 2 wks.

## 2022-06-24 ENCOUNTER — Telehealth: Payer: Self-pay | Admitting: Family Medicine

## 2022-06-24 NOTE — Telephone Encounter (Signed)
Patient called in to confirm her physical for next week, and then called back in to reschedule appointment. Patient has been rescheduled after talking to Dr. Reece Agar but patient stated she may be calling to reschedule appointment again because she isn't sure she will be able to make it. Just a FYI. Thank you!

## 2022-07-01 ENCOUNTER — Encounter: Payer: Medicare Other | Admitting: Family Medicine

## 2022-07-10 ENCOUNTER — Encounter: Payer: Medicare Other | Admitting: Family Medicine

## 2022-07-16 ENCOUNTER — Other Ambulatory Visit: Payer: Self-pay | Admitting: Family Medicine

## 2022-07-20 ENCOUNTER — Telehealth: Payer: Self-pay | Admitting: Family Medicine

## 2022-07-20 NOTE — Telephone Encounter (Signed)
Name of Medication: Alprazolam, Diazepam, Oxycodone-APAP Name of Pharmacy: [place] or Written Date and Quantity: 06/20/22      Alprazolam- #5      Diazepam- #60      Oxycodone-APAP- #90 Last Office Visit and Type: 05/06/22, med f/u Next Office Visit and Type: 08/11/22, 3 mo f/u Last Controlled Substance Agreement Date: 12/26/21 Last UDS: 05/06/22

## 2022-07-20 NOTE — Telephone Encounter (Signed)
Request fwd to Dr. Darnell Level to authorize refills. (See 07/16/22 refill note.)

## 2022-07-20 NOTE — Telephone Encounter (Signed)
Prescription Request  07/20/2022  Is this a "Controlled Substance" medicine? Yes  LOV: 05/06/2022  What is the name of the medication or equipment?  oxyCODONE-acetaminophen (PERCOCET) 7.5-325 MG tablet  ALPRAZolam (XANAX) 1 MG tablet  diazepam (VALIUM) 10 MG tablet   Have you contacted your pharmacy to request a refill? No   Which pharmacy would you like this sent to?  [place], [place] [address] [place] [address] Phone: [phone] Fax: [phone]    Patient notified that their request is being sent to the clinical staff for review and that they should receive a response within 2 business days.   Please advise at Mobile [phone] (mobile)

## 2022-07-21 ENCOUNTER — Other Ambulatory Visit: Payer: Self-pay | Admitting: Family Medicine

## 2022-07-21 DIAGNOSIS — R059 Cough, unspecified: Secondary | ICD-10-CM

## 2022-07-21 NOTE — Telephone Encounter (Signed)
ERx Pt needs to keep 1/30 appt

## 2022-07-21 NOTE — Telephone Encounter (Addendum)
Bentyl last filled: 06/20/22, #60 Albuterol inh last filled:06/20/22,  #8.5 g Celexa last filled: 06/20/22, #90 Colchicine last filled: 06/19/22, #30 Etodolac last filled: 06/19/22, #60 Nitrofurantoin last filled\" 06/19/22, #30 Robaxin last filled 06/19/22, #60 Last OV: 05/06/22, med f/u Next OV: 08/11/22, 3 mo f/u

## 2022-08-11 ENCOUNTER — Ambulatory Visit: Payer: 59 | Admitting: Family Medicine

## 2022-08-11 NOTE — Telephone Encounter (Signed)
Patient called in to reschedule her appointment from today to next week with Dr. Darnell Level. Patient stated that her car is in the shop but should be out by than. Did give patient all her upcoming appointments and times. Thank you!

## 2022-08-16 ENCOUNTER — Other Ambulatory Visit: Payer: Self-pay | Admitting: Family Medicine

## 2022-08-16 DIAGNOSIS — Z789 Other specified health status: Secondary | ICD-10-CM

## 2022-08-16 DIAGNOSIS — E782 Mixed hyperlipidemia: Secondary | ICD-10-CM

## 2022-08-16 DIAGNOSIS — K760 Fatty (change of) liver, not elsewhere classified: Secondary | ICD-10-CM

## 2022-08-16 DIAGNOSIS — M1990 Unspecified osteoarthritis, unspecified site: Secondary | ICD-10-CM

## 2022-08-16 DIAGNOSIS — F1090 Alcohol use, unspecified, uncomplicated: Secondary | ICD-10-CM

## 2022-08-17 ENCOUNTER — Other Ambulatory Visit: Payer: 59

## 2022-08-19 ENCOUNTER — Ambulatory Visit: Payer: 59 | Admitting: Family Medicine

## 2022-08-21 ENCOUNTER — Encounter: Payer: Self-pay | Admitting: Family Medicine

## 2022-08-24 ENCOUNTER — Other Ambulatory Visit: Payer: Self-pay | Admitting: Family Medicine

## 2022-08-24 NOTE — Telephone Encounter (Signed)
Refill request Last office visit 05/06/22 Upcoming appointment 11/03/22 Cancelled multiple appointments and no shows

## 2022-08-26 NOTE — Telephone Encounter (Signed)
Patient called in to follow up on her refill request. Thank you!

## 2022-08-27 NOTE — Telephone Encounter (Signed)
Attempted to contact pt (3x). Recording states, "Call cannot be completed as dialed."

## 2022-08-27 NOTE — Telephone Encounter (Signed)
I've refilled meds another month.  She is overdue for physical as well as for Q72mochronic pain management f/u visit.  She has had several no show appts and cancellations.  I understand she has circumstances which make it difficult to keep appts but at the same time I can't continue filling controlled substances if she continues to miss appts.

## 2022-08-27 NOTE — Telephone Encounter (Signed)
Patient called in checking on the status of these medications being refilled. 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 She would like to know will these rx be filled?

## 2022-08-28 ENCOUNTER — Encounter: Payer: Medicare Other | Admitting: Family Medicine

## 2022-08-28 NOTE — Telephone Encounter (Addendum)
Attempted to contact pt. Recording states, "Call cannot be completed as dialed."   Mailing a letter.

## 2022-08-31 ENCOUNTER — Other Ambulatory Visit: Payer: 59

## 2022-09-24 ENCOUNTER — Other Ambulatory Visit: Payer: Self-pay | Admitting: Family Medicine

## 2022-09-24 DIAGNOSIS — G894 Chronic pain syndrome: Secondary | ICD-10-CM

## 2022-09-24 DIAGNOSIS — F411 Generalized anxiety disorder: Secondary | ICD-10-CM

## 2022-09-24 NOTE — Telephone Encounter (Signed)
Prescription Request  09/24/2022  LOV: 05/06/2022  What is the name of the medication or equipment? ALPRAZolam (XANAX) 1 MG tablet, oxyCODONE-acetaminophen (PERCOCET) 7.5-325 MG tablet & diazepam (VALIUM) 10 MG tablet  Have you contacted your pharmacy to request a refill? No   Which pharmacy would you like this sent to?  [place], [place] [address] [place] [address] Phone: [phone] Fax: [phone]    Patient notified that their request is being sent to the clinical staff for review and that they should receive a response within 2 business days.   Please advise at Mobile [phone] (mobile)

## 2022-09-25 NOTE — Telephone Encounter (Signed)
Name of Medication: Alprazolam, Diazepam, Oxycodone-APAP Name of Pharmacy: [place] or Written Date and Quantity: 08/27/22      Alprazolam- #5      Diazepam- #60      Oxycodone-APAP- #90 Last Office Visit and Type: 05/06/22, med f/u Next Office Visit and Type: 11/03/22, 3 mo f/u Last Controlled Substance Agreement Date: 12/26/21 Last UDS: 05/06/22

## 2022-09-25 NOTE — Addendum Note (Signed)
Addended by: Brenton Grills on: [id number] AM   Modules accepted: Orders

## 2022-09-28 ENCOUNTER — Other Ambulatory Visit: Payer: Self-pay | Admitting: Family Medicine

## 2022-09-29 MED ORDER — OXYCODONE-ACETAMINOPHEN 7.5-325 MG PO TABS: 1.0000 | ORAL_TABLET | Freq: Three times a day (TID) | ORAL | 0 refills | Status: DC | PRN

## 2022-09-29 MED ORDER — ALPRAZOLAM 1 MG PO TABS: 1.0000 mg | ORAL_TABLET | Freq: Every day | ORAL | 0 refills | Status: DC | PRN

## 2022-09-29 MED ORDER — DIAZEPAM 10 MG PO TABS: 10.0000 mg | ORAL_TABLET | Freq: Two times a day (BID) | ORAL | 0 refills | Status: DC

## 2022-09-29 NOTE — Telephone Encounter (Signed)
ERx She has appt scheduled 10/2022. If missed, will start weaning off controlled substances.

## 2022-09-29 NOTE — Telephone Encounter (Signed)
Duplicate request: alprazolam, diazepam, oxycodone-APAP already sent in.  Cortisporin soln last filled:  01/17/22, #10 mL Polytrim soln last filled:  01/14/21, #10 mL Last OV:  05/06/22, med f/u Next OV:  11/03/22, 3 mo f/u

## 2022-09-29 NOTE — Addendum Note (Signed)
Addended by: [name] on: 09/29/2022 07:45 AM   Modules accepted: Orders

## 2022-09-30 ENCOUNTER — Other Ambulatory Visit: Payer: Self-pay | Admitting: Family Medicine

## 2022-09-30 DIAGNOSIS — J432 Centrilobular emphysema: Secondary | ICD-10-CM

## 2022-09-30 DIAGNOSIS — R059 Cough, unspecified: Secondary | ICD-10-CM

## 2022-09-30 DIAGNOSIS — J329 Chronic sinusitis, unspecified: Secondary | ICD-10-CM

## 2022-09-30 NOTE — Telephone Encounter (Signed)
ERx 

## 2022-09-30 NOTE — Telephone Encounter (Signed)
Colchicine last filled:  08/27/22, #30 Robaxin last filled:  08/27/22, #60 Last OV: 05/06/22, med f/u Next OV: 11/03/22, 3 mo f/u

## 2022-10-01 ENCOUNTER — Other Ambulatory Visit: Payer: Self-pay | Admitting: Family Medicine

## 2022-10-01 DIAGNOSIS — Z1231 Encounter for screening mammogram for malignant neoplasm of breast: Secondary | ICD-10-CM

## 2022-10-01 NOTE — Telephone Encounter (Signed)
Last OV: 05/06/22, med f/u Next OV: 11/03/22, 3 mo f/u

## 2022-10-22 ENCOUNTER — Other Ambulatory Visit: Payer: Self-pay | Admitting: Family Medicine

## 2022-10-22 DIAGNOSIS — G894 Chronic pain syndrome: Secondary | ICD-10-CM

## 2022-10-22 DIAGNOSIS — F411 Generalized anxiety disorder: Secondary | ICD-10-CM

## 2022-10-22 NOTE — Telephone Encounter (Signed)
Name of Medication: Alprazolam, Diazepam, Oxycodone-APAP Name of Pharmacy: [facility] Last [place] or Written Date and Quantity: 09/29/22      Alprazolam- #5      Diazepam- #60      Oxycodone-APAP- #90 Last Office Visit and Type: 05/06/22, med f/u Next Office Visit and Type: 11/03/22, 3 mo f/u Last Controlled Substance Agreement Date: 12/26/21 Last UDS: 05/06/22

## 2022-10-24 NOTE — Telephone Encounter (Signed)
ERx. Needs to keep 11/03/2022 appt for future refills.

## 2022-10-27 ENCOUNTER — Other Ambulatory Visit: Payer: 59

## 2022-11-03 ENCOUNTER — Encounter: Payer: 59 | Admitting: Family Medicine

## 2022-11-04 ENCOUNTER — Telehealth: Payer: Self-pay | Admitting: Family Medicine

## 2022-11-04 NOTE — Telephone Encounter (Signed)
Contacted [employer] ref# [id number] Patient has 100% coverage through [employer] for routine medical appointments [phone]  8 am - 8 pm EST Monday thru Friday   Patient needs to call to schedule the pick-up and drop off

## 2022-11-13 NOTE — Telephone Encounter (Signed)
Have tried calling multiple times, each time "call cannot be completed as dialed".  Is there another # available for patient?

## 2022-11-20 ENCOUNTER — Telehealth: Payer: Self-pay

## 2022-11-20 ENCOUNTER — Other Ambulatory Visit: Payer: Self-pay | Admitting: Family Medicine

## 2022-11-20 DIAGNOSIS — G894 Chronic pain syndrome: Secondary | ICD-10-CM

## 2022-11-20 DIAGNOSIS — F411 Generalized anxiety disorder: Secondary | ICD-10-CM

## 2022-11-20 NOTE — Telephone Encounter (Signed)
Name of Medication: Alprazolam, Diazepam, Oxycodone-APAP Name of Pharmacy: [facility] Last [place] or Written Date and Quantity: 10/27/22      Alprazolam- #5      Diazepam- #60      Oxycodone-APAP- #90 Last Office Visit and Type: 05/06/22, med f/u Next Office Visit and Type: 01/12/23, CPE; pt has  Last Controlled Substance Agreement Date: 12/26/21 Last UDS: 05/06/22

## 2022-11-20 NOTE — Progress Notes (Signed)
Care Management & Coordination Services Pharmacy Team  Reason for Encounter:  Eye Exam DM   No further action needed   No exam recorded. No diagnosis of DM   Al Corpus, PharmD notified  Burt Knack, [facility] Clinical Pharmacy Assistant [phone]

## 2022-11-23 NOTE — Telephone Encounter (Signed)
Care Management & Coordination Services Pharmacy Team  Reason for Encounter: DM Eye Exam Care Gap  Per insurance records, patient is at risk for failing diabetes standards of care recommendations for annual diabetic eye exam. Reviewed chart for diabetes diagnosis and last documented eye exam.  Patient does not have a diabetes diagnosis.   Action taken: No further action needed
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
## 2022-11-26 NOTE — Telephone Encounter (Signed)
ERx 

## 2022-12-10 ENCOUNTER — Ambulatory Visit: Payer: 59 | Admitting: Family Medicine

## 2022-12-23 ENCOUNTER — Other Ambulatory Visit: Payer: Self-pay | Admitting: Pharmacist

## 2022-12-23 NOTE — Progress Notes (Signed)
Pharmacy Quality Measure Review  This patient is appearing on the insurance-provided list for being at risk of failing the adherence measure for cholesterol medications this calendar year.   Medication: atorvastatin 10 mg daily Last fill date: 09/30/22 for 30 day supply, confirmed with pharmacy.  Attempted to contact patient to discuss, noted that number was not in service.   Prescription expires 01/16/23; next PCP appointment is 01/12/23. Please consider discussing statin adherence and need for a refill at that time. Feel free to place a referral to pharmacy for medication management if side effect.  Catie Eppie Gibson, PharmD, BCACP, CPP [facility] [phone]

## 2022-12-28 ENCOUNTER — Other Ambulatory Visit: Payer: Self-pay | Admitting: Family Medicine

## 2022-12-28 DIAGNOSIS — G894 Chronic pain syndrome: Secondary | ICD-10-CM

## 2022-12-28 DIAGNOSIS — J40 Bronchitis, not specified as acute or chronic: Secondary | ICD-10-CM

## 2022-12-28 DIAGNOSIS — F411 Generalized anxiety disorder: Secondary | ICD-10-CM

## 2022-12-28 NOTE — Telephone Encounter (Signed)
Macrodantin last filled:  10/03/22, #30 Polytrim last filled:  10/01/22, #10 mL Acyclovir last filled:  09/30/22, #60 Linzess last filled:   09/30/22, #30 Etodolac last filled:  09/30/22, #60 Allopurinol last filled:  09/30/22, #90 Nexium last filled:  09/30/22, #90 Last OV:  05/06/22, med follow up Next OV:  CPE

## 2022-12-29 NOTE — Telephone Encounter (Signed)
ERx 

## 2023-01-05 ENCOUNTER — Other Ambulatory Visit: Payer: Self-pay | Admitting: Family Medicine

## 2023-01-05 NOTE — Telephone Encounter (Signed)
ERx tapering dose

## 2023-01-06 NOTE — Telephone Encounter (Signed)
Cortisporin soln Last filled:  01/17/22, #10 mL Last OV:  05/06/22, f/u Next OV:  01/12/23, CPE

## 2023-01-07 NOTE — Telephone Encounter (Signed)
ERx 

## 2023-01-12 ENCOUNTER — Encounter: Payer: 59 | Admitting: Family Medicine

## 2023-01-13 ENCOUNTER — Encounter: Payer: Self-pay | Admitting: Family Medicine

## 2023-01-21 ENCOUNTER — Other Ambulatory Visit: Payer: Self-pay | Admitting: Family Medicine

## 2023-01-21 DIAGNOSIS — S81802S Unspecified open wound, left lower leg, sequela: Secondary | ICD-10-CM | POA: Diagnosis not present

## 2023-01-21 DIAGNOSIS — Z03818 Encounter for observation for suspected exposure to other biological agents ruled out: Secondary | ICD-10-CM | POA: Diagnosis not present

## 2023-01-21 DIAGNOSIS — J019 Acute sinusitis, unspecified: Secondary | ICD-10-CM | POA: Diagnosis not present

## 2023-01-21 DIAGNOSIS — R110 Nausea: Secondary | ICD-10-CM | POA: Diagnosis not present

## 2023-02-05 ENCOUNTER — Other Ambulatory Visit: Payer: Self-pay | Admitting: Family Medicine

## 2023-02-05 DIAGNOSIS — S81802A Unspecified open wound, left lower leg, initial encounter: Secondary | ICD-10-CM | POA: Diagnosis not present

## 2023-02-05 NOTE — Telephone Encounter (Signed)
Mupirocin oint Last filled:  01/21/23, #22 g Last OV:  05/06/22, chronic pain mgmt Next OV:  none

## 2023-03-01 DIAGNOSIS — M25522 Pain in left elbow: Secondary | ICD-10-CM | POA: Diagnosis not present

## 2023-03-01 DIAGNOSIS — M25072 Hemarthrosis, left ankle: Secondary | ICD-10-CM | POA: Diagnosis not present

## 2023-03-01 DIAGNOSIS — M25512 Pain in left shoulder: Secondary | ICD-10-CM | POA: Diagnosis not present

## 2023-03-06 ENCOUNTER — Emergency Department
Admission: EM | Admit: 2023-03-06 | Discharge: 2023-03-06 | Disposition: A | Discharge status: Home / Self Care | Payer: 59 | Attending: Emergency Medicine | Admitting: Emergency Medicine

## 2023-03-06 ENCOUNTER — Emergency Department: Payer: 59

## 2023-03-06 ENCOUNTER — Other Ambulatory Visit: Payer: Self-pay

## 2023-03-06 DIAGNOSIS — J340 Abscess, furuncle and carbuncle of nose: Secondary | ICD-10-CM | POA: Diagnosis not present

## 2023-03-06 DIAGNOSIS — Z743 Need for continuous supervision: Secondary | ICD-10-CM | POA: Diagnosis not present

## 2023-03-06 DIAGNOSIS — R040 Epistaxis: Secondary | ICD-10-CM

## 2023-03-06 DIAGNOSIS — M4312 Spondylolisthesis, cervical region: Secondary | ICD-10-CM | POA: Diagnosis not present

## 2023-03-06 DIAGNOSIS — R519 Headache, unspecified: Secondary | ICD-10-CM | POA: Diagnosis not present

## 2023-03-06 DIAGNOSIS — M50322 Other cervical disc degeneration at C5-C6 level: Secondary | ICD-10-CM | POA: Diagnosis not present

## 2023-03-06 DIAGNOSIS — M47812 Spondylosis without myelopathy or radiculopathy, cervical region: Secondary | ICD-10-CM | POA: Insufficient documentation

## 2023-03-06 DIAGNOSIS — S0990XA Unspecified injury of head, initial encounter: Secondary | ICD-10-CM | POA: Diagnosis not present

## 2023-03-06 DIAGNOSIS — L03211 Cellulitis of face: Secondary | ICD-10-CM | POA: Diagnosis not present

## 2023-03-06 DIAGNOSIS — S199XXA Unspecified injury of neck, initial encounter: Secondary | ICD-10-CM | POA: Diagnosis not present

## 2023-03-06 DIAGNOSIS — R58 Hemorrhage, not elsewhere classified: Secondary | ICD-10-CM | POA: Diagnosis not present

## 2023-03-06 LAB — CBC WITH DIFFERENTIAL/PLATELET
Abs Immature Granulocytes: 0.02 10*3/uL (ref 0.00–0.07)
Basophils Absolute: 0 10*3/uL (ref 0.0–0.1)
Basophils Relative: 1 %
Eosinophils Absolute: 0 10*3/uL (ref 0.0–0.5)
Eosinophils Relative: 1 %
HCT: 36.7 % (ref 36.0–46.0)
Hemoglobin: 12.2 g/dL (ref 12.0–15.0)
Immature Granulocytes: 0 %
Lymphocytes Relative: 28 %
Lymphs Abs: 2.4 10*3/uL (ref 0.7–4.0)
MCH: 30.4 pg (ref 26.0–34.0)
MCHC: 33.2 g/dL (ref 30.0–36.0)
MCV: 91.5 fL (ref 80.0–100.0)
Monocytes Absolute: 0.7 10*3/uL (ref 0.1–1.0)
Monocytes Relative: 8 %
Neutro Abs: 5.5 10*3/uL (ref 1.7–7.7)
Neutrophils Relative %: 62 %
Platelets: 265 10*3/uL (ref 150–400)
RBC: 4.01 MIL/uL (ref 3.87–5.11)
RDW: 12.1 % (ref 11.5–15.5)
WBC: 8.7 10*3/uL (ref 4.0–10.5)
nRBC: 0 % (ref 0.0–0.2)

## 2023-03-06 LAB — COMPREHENSIVE METABOLIC PANEL
ALT: 13 U/L (ref 0–44)
AST: 12 U/L — ABNORMAL LOW (ref 15–41)
Albumin: 3.3 g/dL — ABNORMAL LOW (ref 3.5–5.0)
Alkaline Phosphatase: 81 U/L (ref 38–126)
Anion gap: 10 (ref 5–15)
BUN: 11 mg/dL (ref 8–23)
CO2: 27 mmol/L (ref 22–32)
Calcium: 8.7 mg/dL — ABNORMAL LOW (ref 8.9–10.3)
Chloride: 101 mmol/L (ref 98–111)
Creatinine, Ser: 0.74 mg/dL (ref 0.44–1.00)
GFR, Estimated: >60 mL/min (ref >60)
Glucose, Bld: 96 mg/dL (ref 70–99)
Potassium: 3.3 mmol/L — ABNORMAL LOW (ref 3.5–5.1)
Sodium: 138 mmol/L (ref 135–145)
Total Bilirubin: 0.6 mg/dL (ref 0.3–1.2)
Total Protein: 6.7 g/dL (ref 6.5–8.1)

## 2023-03-06 LAB — ETHANOL: Alcohol, Ethyl (B): <10 mg/dL (ref <10)

## 2023-03-06 LAB — ACETAMINOPHEN LEVEL: Acetaminophen (Tylenol), Serum: <10 ug/mL — ABNORMAL LOW (ref 10–30)

## 2023-03-06 LAB — SALICYLATE LEVEL: Salicylate Lvl: <7 mg/dL — ABNORMAL LOW (ref 7.0–30.0)

## 2023-03-06 MED ORDER — OXYMETAZOLINE HCL 0.05 % NA SOLN: 2.0000 | Freq: Two times a day (BID) | NASAL | 2 refills | Status: DC | PRN

## 2023-03-06 MED ORDER — DOXYCYCLINE HYCLATE 100 MG PO TABS
100.0000 mg | ORAL_TABLET | Freq: Once | ORAL | Status: AC
  Administered 2023-03-06: 100 mg via ORAL
  Filled 2023-03-06: qty 1

## 2023-03-06 MED ORDER — DOXYCYCLINE HYCLATE 100 MG PO TABS: 100.0000 mg | ORAL_TABLET | Freq: Two times a day (BID) | ORAL | 0 refills | Status: DC

## 2023-03-06 MED ORDER — AMOXICILLIN-POT CLAVULANATE 875-125 MG PO TABS
1.0000 | ORAL_TABLET | Freq: Once | ORAL | Status: AC
  Administered 2023-03-06: 1 via ORAL
  Filled 2023-03-06: qty 1

## 2023-03-06 MED ORDER — AMOXICILLIN-POT CLAVULANATE 875-125 MG PO TABS: 1.0000 | ORAL_TABLET | Freq: Two times a day (BID) | ORAL | 0 refills | Status: AC

## 2023-03-06 NOTE — ED Triage Notes (Signed)
Pt comes via [facility]Ms from home with c/o nose bleed for about 5 days. Pt states it has been on and off. Pt states she thinks her nose might be infected.  Pt states ears sore and sinuses.  Pt is not on thinners. No bleeding at this time.
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
## 2023-03-06 NOTE — ED Notes (Signed)
Patient has redness to the nose and dried blood. Patient appears to have a "flat affect". Patient states that she will have no way home once she leaves here today.

## 2023-03-09 ENCOUNTER — Telehealth: Payer: Self-pay | Admitting: Family Medicine

## 2023-03-09 DIAGNOSIS — F411 Generalized anxiety disorder: Secondary | ICD-10-CM

## 2023-03-09 DIAGNOSIS — G894 Chronic pain syndrome: Secondary | ICD-10-CM

## 2023-03-09 MED ORDER — FLUCONAZOLE 150 MG PO TABS: 150.0000 mg | ORAL_TABLET | Freq: Once | ORAL | 0 refills | Status: AC

## 2023-03-09 MED ORDER — DIAZEPAM 10 MG PO TABS
5.0000 mg | ORAL_TABLET | Freq: Two times a day (BID) | ORAL | 0 refills | Status: DC | PRN
Start: 2023-03-09 — End: 2023-04-23

## 2023-03-09 MED ORDER — OXYCODONE-ACETAMINOPHEN 7.5-325 MG PO TABS
1.0000 | ORAL_TABLET | Freq: Two times a day (BID) | ORAL | 0 refills | Status: DC | PRN
Start: 2023-03-09 — End: 2023-04-23

## 2023-03-09 NOTE — Telephone Encounter (Signed)
ERx diflucan x1 after antibiotic course.  Don't take with colchicine.

## 2023-03-09 NOTE — Telephone Encounter (Signed)
Called patient and reviewed all information. Patient verbalized understanding. Will call if any further questions.  
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
## 2023-03-09 NOTE — Telephone Encounter (Signed)
[place] CSRS reviewed. No other prescribers of controlled substances documented.  Last seen 05/06/2022. Multiple late cancellations and missed appts in interim. May be discharged if misses another appointment.  Will continue taper of controlled substances as previously discussed.  She should keep appointment next week.

## 2023-03-11 ENCOUNTER — Other Ambulatory Visit: Payer: Self-pay | Admitting: Family Medicine

## 2023-03-11 NOTE — Telephone Encounter (Signed)
ERx 

## 2023-03-12 ENCOUNTER — Other Ambulatory Visit (HOSPITAL_COMMUNITY): Payer: Self-pay

## 2023-03-12 ENCOUNTER — Telehealth: Payer: Self-pay

## 2023-03-12 NOTE — Telephone Encounter (Signed)
*  Primary  Pharmacy Patient Advocate Encounter   Received notification from CoverMyMeds that prior authorization for oxyCODONE-Acetaminophen 7.5-325MG  tablets  is required/requested.   Insurance verification completed.   The patient is insured through [facility] .   Per test claim: PA required; PA started via CoverMyMeds. KEY [id number] . Waiting for clinical questions to populate.

## 2023-03-16 ENCOUNTER — Encounter: Payer: Self-pay | Admitting: Family Medicine

## 2023-03-16 ENCOUNTER — Ambulatory Visit (INDEPENDENT_AMBULATORY_CARE_PROVIDER_SITE_OTHER): Payer: 59 | Admitting: Family Medicine

## 2023-03-16 VITALS — BP 138/84 | HR 89 | Temp 97.3°F | Ht 65.0 in | Wt 177.2 lb

## 2023-03-16 DIAGNOSIS — Z23 Encounter for immunization: Secondary | ICD-10-CM | POA: Diagnosis not present

## 2023-03-16 DIAGNOSIS — G8929 Other chronic pain: Secondary | ICD-10-CM

## 2023-03-16 DIAGNOSIS — Z599 Problem related to housing and economic circumstances, unspecified: Secondary | ICD-10-CM

## 2023-03-16 DIAGNOSIS — J329 Chronic sinusitis, unspecified: Secondary | ICD-10-CM

## 2023-03-16 DIAGNOSIS — G894 Chronic pain syndrome: Secondary | ICD-10-CM

## 2023-03-16 DIAGNOSIS — E663 Overweight: Secondary | ICD-10-CM

## 2023-03-16 DIAGNOSIS — F411 Generalized anxiety disorder: Secondary | ICD-10-CM

## 2023-03-16 DIAGNOSIS — N3020 Other chronic cystitis without hematuria: Secondary | ICD-10-CM

## 2023-03-16 DIAGNOSIS — T148XXA Other injury of unspecified body region, initial encounter: Secondary | ICD-10-CM | POA: Diagnosis not present

## 2023-03-16 DIAGNOSIS — F22 Delusional disorders: Secondary | ICD-10-CM

## 2023-03-16 DIAGNOSIS — F319 Bipolar disorder, unspecified: Secondary | ICD-10-CM

## 2023-03-16 LAB — POC URINALSYSI DIPSTICK (AUTOMATED)
Bilirubin, UA: NEGATIVE
Blood, UA: NEGATIVE
Glucose, UA: NEGATIVE
Ketones, UA: NEGATIVE
Leukocytes, UA: NEGATIVE
Nitrite, UA: NEGATIVE
Protein, UA: NEGATIVE
Spec Grav, UA: 1.015 (ref 1.010–1.025)
Urobilinogen, UA: 0.2 U/dL
pH, UA: 6 (ref 5.0–8.0)

## 2023-03-16 NOTE — Patient Instructions (Addendum)
Sign designated party release form up front if you want to allow [place] to speak with [name] your neighbor [phone].  I want you to see psychiatry - I have placed a new referral to [facility] psychiatrist.  Urine drug screen today.  Urinalysis today.  Return in 6 weeks for physical/wellness visit.
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
## 2023-03-17 NOTE — Assessment & Plan Note (Signed)
Chronic wound present since necrotizing fasciitis debridement 01/2021.  Wound to L medial ankle looks the best it has in a long time.  Dressed with triple abx ointment and bandaid.  Reviewed home care.

## 2023-03-17 NOTE — Assessment & Plan Note (Signed)
Currently living alone in camper on son's property - however she is looking to move.  I asked her to let us know when she gets reliable phone # for care coordinator referral.

## 2023-03-17 NOTE — Addendum Note (Signed)
Addended by: [name] on: 03/17/2023 07:57 AM   Modules accepted: Level of Service

## 2023-03-17 NOTE — Assessment & Plan Note (Signed)
Severe, managed with valium and celexa. Refer to psych. See below.

## 2023-03-17 NOTE — Assessment & Plan Note (Signed)
Longstanding on macrobid prophylaxis. Discussed risks of longterm nitrofurantoin use. Recommend stop UTI ppx at this time, monitor for recurrent UTI symptoms.  Update UA today - no signs of infection.

## 2023-03-17 NOTE — Assessment & Plan Note (Signed)
Reviewed almost 30 lb weight loss in the past year.  She states this has been intentional.
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
## 2023-03-17 NOTE — Assessment & Plan Note (Signed)
Seems quiescent.

## 2023-03-17 NOTE — Assessment & Plan Note (Signed)
Last saw psychiatry 2018 (Dr Lolly Mustache). She self stopped abilify last year.  Reviewed concerns with sole use of antidepressant in bipolar disorder and triggering of manic episodes.  Recommend psychiatry care - new referral placed to [facility].

## 2023-03-17 NOTE — Assessment & Plan Note (Addendum)
[place] CSRS reviewed.  Had not been seen since 04/2022. Reviewed expectations to keep scheduled appts for ongoing controlled substance prescription.  Has had legal and financial troubles.  Update UDS.  Continue valium 10mg  BID with oxycodone 7.5/325mg  BID PRN.  See below.

## 2023-03-18 ENCOUNTER — Telehealth: Payer: Self-pay

## 2023-03-18 NOTE — Telephone Encounter (Signed)
Transition Care Management Unsuccessful Follow-up Telephone Call  Date of discharge and from where:  [place] 8/24  Attempts:  1st Attempt  Reason for unsuccessful TCM follow-up call:  Left voice message   Lenard Forth [facility]  [facility], [facility] Guide, Phone: [phone] Website: [email or web address]

## 2023-03-19 ENCOUNTER — Encounter: Payer: Self-pay | Admitting: Family Medicine

## 2023-03-19 LAB — DRUG SCREEN 8 W/CONF, UR
Amphetamines, Urine: NEGATIVE ng/mL
Barbiturate screen, urine: NEGATIVE ng/mL
Buprenorphine, Urine: NEGATIVE ng/mL
Cannabinoid Quant, Ur: NEGATIVE ng/mL
Cocaine (Metab.): NEGATIVE ng/mL
Methadone Screen, Urine: NEGATIVE ng/mL
OPIATE SCREEN URINE: NEGATIVE ng/mL

## 2023-03-19 LAB — DRUG PROFILE 799035
BENZODIAZEPINES: POSITIVE — AB
NORDIAZEPAM GC/MS CONF: 270 ng/mL
NORDIAZEPAM: POSITIVE — AB
OH-ALPRAZOLAM: NEGATIVE
OXAZEPAM GC/MS CONF: 255 ng/mL
OXAZEPAM: POSITIVE — AB

## 2023-04-08 NOTE — Telephone Encounter (Signed)
Pharmacy Patient Advocate Encounter  Received notification from [facility] that Prior Authorization for oxyCODONE-Acetaminophen 7.5-325MG  tablets  has been APPROVED from 03/12/2023 to 07/13/2023

## 2023-04-22 ENCOUNTER — Other Ambulatory Visit: Payer: Self-pay | Admitting: Family Medicine

## 2023-04-22 DIAGNOSIS — J329 Chronic sinusitis, unspecified: Secondary | ICD-10-CM

## 2023-04-22 DIAGNOSIS — R059 Cough, unspecified: Secondary | ICD-10-CM

## 2023-04-22 DIAGNOSIS — F411 Generalized anxiety disorder: Secondary | ICD-10-CM

## 2023-04-22 DIAGNOSIS — J432 Centrilobular emphysema: Secondary | ICD-10-CM

## 2023-04-22 DIAGNOSIS — G894 Chronic pain syndrome: Secondary | ICD-10-CM

## 2023-04-22 NOTE — Telephone Encounter (Signed)
Prescription Request  04/22/2023  LOV: 03/16/2023  What is the name of the medication or equipment? nitrofurantoin (MACRODANTIN) 100 MG capsule oxyCODONE-acetaminophen (PERCOCET) 7.5-325 MG tablet diazepam (VALIUM) 10 MG tablet   Have you contacted your pharmacy to request a refill? Yes   Which pharmacy would you like this sent to?  [facility] - [place], [place] - [address] [place] [address] Phone: [phone] Fax: [phone]    Patient notified that their request is being sent to the clinical staff for review and that they should receive a response within 2 business days.   Please advise at Mobile [phone] (mobile) pt would like for a call back at [phone] ask for Mr.king

## 2023-04-23 NOTE — Telephone Encounter (Signed)
The oxycodone and diazepam were sent this morning.   Will forward to Dr Sharen Hones for the [facility].

## 2023-04-23 NOTE — Telephone Encounter (Signed)
ERx 

## 2023-04-30 ENCOUNTER — Encounter: Payer: 59 | Admitting: Family Medicine

## 2023-05-03 ENCOUNTER — Encounter: Payer: Self-pay | Admitting: Family Medicine

## 2023-05-06 ENCOUNTER — Emergency Department: Admission: EM | Admit: 2023-05-06 | Discharge: 2023-05-06 | Discharge status: Against Medical Advice | Payer: 59

## 2023-05-06 NOTE — ED Notes (Signed)
Pt states she is unable to stay because she is going to miss the bust

## 2023-05-07 ENCOUNTER — Encounter: Payer: Self-pay | Admitting: Physician Assistant

## 2023-05-07 ENCOUNTER — Emergency Department
Admission: EM | Admit: 2023-05-07 | Discharge: 2023-05-07 | Disposition: A | Discharge status: Home / Self Care | Payer: 59 | Attending: Emergency Medicine | Admitting: Emergency Medicine

## 2023-05-07 ENCOUNTER — Other Ambulatory Visit: Payer: Self-pay

## 2023-05-07 DIAGNOSIS — Z711 Person with feared health complaint in whom no diagnosis is made: Secondary | ICD-10-CM | POA: Diagnosis not present

## 2023-05-07 DIAGNOSIS — F319 Bipolar disorder, unspecified: Secondary | ICD-10-CM | POA: Diagnosis not present

## 2023-05-07 DIAGNOSIS — F22 Delusional disorders: Secondary | ICD-10-CM | POA: Diagnosis present

## 2023-05-07 LAB — URINALYSIS, ROUTINE W REFLEX MICROSCOPIC
Bacteria, UA: NONE SEEN
Bilirubin Urine: NEGATIVE
Glucose, UA: NEGATIVE mg/dL
Hgb urine dipstick: NEGATIVE
Ketones, ur: NEGATIVE mg/dL
Nitrite: NEGATIVE
Protein, ur: NEGATIVE mg/dL
Specific Gravity, Urine: 1.02 (ref 1.005–1.030)
pH: 5 (ref 5.0–8.0)

## 2023-05-07 LAB — URINE DRUG SCREEN, QUALITATIVE (ARMC ONLY)
Amphetamines, Ur Screen: POSITIVE — AB
Barbiturates, Ur Screen: NOT DETECTED
Benzodiazepine, Ur Scrn: POSITIVE — AB
Cannabinoid 50 Ng, Ur ~~LOC~~: NOT DETECTED
Cocaine Metabolite,Ur ~~LOC~~: POSITIVE — AB
MDMA (Ecstasy)Ur Screen: NOT DETECTED
Methadone Scn, Ur: NOT DETECTED
Opiate, Ur Screen: NOT DETECTED
Phencyclidine (PCP) Ur S: NOT DETECTED
Tricyclic, Ur Screen: NOT DETECTED

## 2023-05-07 NOTE — ED Provider Notes (Signed)
[facility] Emergency Department Provider Note     Event Date/Time   First MD Initiated Contact with Patient 05/07/23 1504     (approximate)   History   Worms   HPI  Kaylee [name] is a 62 y.o. female with a history of anxiety, depression, bipolar disorder, RA, presents to the ED for evaluation of her perceived infestation with worms.  Patient would describe "larva worms" that she has had for the last month.  She would describe exposure from her room in a boardinghouse that she is renting.  By report she claims someone died in the same room on the carpet.  She apparently had a carpet, and sustained in the bed station with these particular worms.  She would describe the worms coming from her skin, her scalp, her fingers, toes, and her perineum.  She would report that she is constantly having to pull these worms from her hair, and that they have also been in her left lower extremity wound which is chronic.  At this time.  Patient denies any itching at this time but presents to the ED stating she was referred to [place] from [place] [facility].  She apparently had a visit with [facility] yesterday, but no prescriptions were provided.  Further chart review does indicate the patient with a previous diagnosis of Ekbom delusional parasitosis, likely exacerbated by the recent passing of her husband.  Patient however, would report that she has had this infestation before, and a previous dermatologist that [facility] confirm diagnosis and treated her as appropriate.  She is requesting antibiotics at this time.  Further discussion with the patient reveals that she allegedly took a of ivermectin, that she got from the tractor supply store, that would be appropriate for a course.  Patient goes on to reveal that [place], and is accustomed treating herself.  Patient is throughout this interview and discussion, attempting to manifest worms from her scalp and skin by pinching and pulling at  her hair, neck, and lower extremity.  She is wearing gloves in the room, and has, taken a tub of virucidal wipes, used to disinfect the rooms, and is using it to apparently cleanse her skin and attempt to show me worms.  Physical Exam   Triage Vital Signs: ED Triage Vitals  Encounter Vitals Group     BP 05/07/23 1415 105/78     Systolic BP Percentile --      Diastolic BP Percentile --      Pulse Rate 05/07/23 1415 (!) 107     Resp 05/07/23 1415 20     Temp 05/07/23 1415 98.4 F (36.9 C)     Temp Source 05/07/23 1415 Oral     SpO2 05/07/23 1415 97 %     Weight 05/07/23 1416 173 lb (78.5 kg)     Height 05/07/23 1416 5\' 6"  (1.676 m)     Head Circumference --      Peak Flow --      Pain Score 05/07/23 1416 0     Pain Loc --      Pain Education --      Exclude from Growth Chart --     Most recent vital signs: Vitals:   05/07/23 1415 05/07/23 1657  BP: 105/78 110/81  Pulse: (!) 107 97  Resp: 20 18  Temp: 98.4 F (36.9 C) 98.3 F (36.8 C)  SpO2: 97% 98%    General Awake, no distress. anxious HEENT NCAT.  PERRL. EOMI. No rhinorrhea. Mucous membranes are moist.  CV:  Good peripheral perfusion.  RESP:  Normal effort.  ABD:  No distention.  SKIN:  LLE with a chronic stable medial ankle wound from a post op fasciotomy.  The wound is present with a dry wound bed with pink rolled edges.  No purulence, warmth, induration, fluctuance is appreciated.  No lymphangitis is noted. No evidence of any parasites or worms to the patient's skin or scalp.  ED Results / Procedures / Treatments   Labs (all labs ordered are listed, but only abnormal results are displayed) Labs Reviewed  URINALYSIS, ROUTINE W REFLEX MICROSCOPIC - Abnormal; Notable for the following components:      Result Value   Color, Urine YELLOW (*)    APPearance HAZY (*)    Leukocytes,Ua SMALL (*)    All other components within normal limits  URINE DRUG SCREEN, QUALITATIVE ([facility] ONLY) - Abnormal; Notable for the  following components:   Amphetamines, Ur Screen POSITIVE (*)    Cocaine Metabolite,Ur [place] POSITIVE (*)    Benzodiazepine, Ur Scrn POSITIVE (*)    All other components within normal limits     EKG  RADIOLOGY No results found.   PROCEDURES:  Critical Care performed: No  Procedures   MEDICATIONS ORDERED IN ED: Medications - No data to display   IMPRESSION / MDM / ASSESSMENT AND PLAN / ED COURSE  I reviewed the triage vital signs and the nursing notes.                              Differential diagnosis includes, but is not limited to, psychosis, parasitosis, hypochondria, delusions  Patient's presentation is most consistent with acute, uncomplicated illness.  Patient's diagnosis is consistent with delusional parasitosis.  Patient without clinical evidence of a worm infestation on presentation.  She does have a chronic stable wound to the LLE which she likely has been picking at.  No signs of acute infection or cellulitis at this presentation.  Patient UA did confirm evidence of cocaine and amphetamines in addition to the BZD's for which the patient does have a prescription.  I did ask the patient if she felt as though she needed to speak to someone from the behavioral/psych team.  She declined at this time, stating that she does not need to speak anybody.  I advised that I would not be providing her with any prescriptions for ivermectin or antibiotics given her presentation and the fact that she had apparently taken some OTC veterinary grade ivermectin yesterday, by her report.  Patient is agreeable at this time to be discharged to follow-up with primary provider as discussed.  Her wound on the lower leg is dressed and she is discharged with wound care instructions.  Patient is to follow up with PCP as discussed, as needed or otherwise directed. Patient is given ED precautions to return to the ED for any worsening or new symptoms.  FINAL CLINICAL IMPRESSION(S) / ED DIAGNOSES   Final  diagnoses:  Ekbom's delusional parasitosis (HCC)  Feared complaint without diagnosis     Rx / DC Orders   ED Discharge Orders     None        Note:  This document was prepared using Dragon voice recognition software and may include unintentional dictation errors.    Lissa Hoard, PA-C 05/07/23 1756    Shaune Pollack, MD 05/14/23 1135

## 2023-05-07 NOTE — ED Triage Notes (Addendum)
Pt sts that she lives in a boarding house and a person that previously lived in the room that the pt lives in had worms all over the floor. Pt sts that she has worms everywhere on her body, her head, ears, rectum and under her toes. Pt is rambling on while in triage and does not let staff have the ability to talk.

## 2023-05-07 NOTE — Discharge Instructions (Addendum)
There is no physical evidence of worms on exam today.  Your leg wound appears to be stable without signs of infection at this time.  Keep the wound clean, dry, and covered.  You should follow-up with primary provider for ongoing evaluation and management of your concern for worms.

## 2023-05-12 ENCOUNTER — Ambulatory Visit (HOSPITAL_COMMUNITY): Payer: 59 | Admitting: Student

## 2023-05-20 ENCOUNTER — Other Ambulatory Visit: Payer: Self-pay | Admitting: Family Medicine

## 2023-05-20 DIAGNOSIS — F411 Generalized anxiety disorder: Secondary | ICD-10-CM

## 2023-05-20 DIAGNOSIS — G894 Chronic pain syndrome: Secondary | ICD-10-CM

## 2023-05-24 NOTE — Telephone Encounter (Signed)
Name of Medication:  Diazepam, Oxycodone-APAP Name of Pharmacy: [facility] Last [place] or Written Date and Quantity: 04/23/23      Diazepam- #40      Oxycodone-APAP- #60 Last Office Visit and Type:  03/16/23, med f/u Next Office Visit and Type:  07/13/23, CPE  Last Controlled Substance Agreement Date:  12/26/21 Last UDS: 03/16/23  Allopurinol last filled:  04/22/23, #10 Celexa last filled:  04/22/23, #90 Colchicine last filled:  04/22/23, #30 Nexium last filled:  04/22/23, #90 Robaxin last filled:  04/22/23, #60 Lovaza last filled:  01/16/22, #180
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
## 2023-05-25 NOTE — Telephone Encounter (Signed)
She tested positive for amphetamines and cocaine at ER on recent UDS. This is violation of her controlled substance contract.  She missed her psychiatry appt 05/12/2023.  I can not continue prescribing controlled substances.  I tried reaching pt at both #s in chart unsuccessfully to discuss. Reached friend Doretha Imus but she was not with him.  I have refilled other meds but will start benzo and opiate taper over next 2 months to come off controlled substances.   If she calls back, please get good # to reach her at so I can discuss this with her.

## 2023-06-16 ENCOUNTER — Emergency Department
Admission: EM | Admit: 2023-06-16 | Discharge: 2023-06-16 | Disposition: A | Discharge status: Home / Self Care | Payer: 59 | Attending: Emergency Medicine | Admitting: Emergency Medicine

## 2023-06-16 ENCOUNTER — Encounter: Payer: Self-pay | Admitting: Emergency Medicine

## 2023-06-16 DIAGNOSIS — Z139 Encounter for screening, unspecified: Secondary | ICD-10-CM

## 2023-06-16 DIAGNOSIS — F22 Delusional disorders: Secondary | ICD-10-CM | POA: Insufficient documentation

## 2023-06-16 DIAGNOSIS — F10129 Alcohol abuse with intoxication, unspecified: Secondary | ICD-10-CM | POA: Insufficient documentation

## 2023-06-16 DIAGNOSIS — Z765 Malingerer [conscious simulation]: Secondary | ICD-10-CM | POA: Diagnosis not present

## 2023-06-16 DIAGNOSIS — Y908 Blood alcohol level of 240 mg/100 ml or more: Secondary | ICD-10-CM | POA: Insufficient documentation

## 2023-06-16 DIAGNOSIS — F10920 Alcohol use, unspecified with intoxication, uncomplicated: Secondary | ICD-10-CM

## 2023-06-16 DIAGNOSIS — Z0001 Encounter for general adult medical examination with abnormal findings: Secondary | ICD-10-CM | POA: Insufficient documentation

## 2023-06-16 DIAGNOSIS — Z0000 Encounter for general adult medical examination without abnormal findings: Secondary | ICD-10-CM | POA: Diagnosis not present

## 2023-06-16 LAB — COMPREHENSIVE METABOLIC PANEL
ALT: 22 U/L (ref 0–44)
AST: 27 U/L (ref 15–41)
Albumin: 4 g/dL (ref 3.5–5.0)
Alkaline Phosphatase: 84 U/L (ref 38–126)
Anion gap: 10 (ref 5–15)
BUN: 14 mg/dL (ref 8–23)
CO2: 27 mmol/L (ref 22–32)
Calcium: 8.6 mg/dL — ABNORMAL LOW (ref 8.9–10.3)
Chloride: 106 mmol/L (ref 98–111)
Creatinine, Ser: 0.75 mg/dL (ref 0.44–1.00)
GFR, Estimated: >60 mL/min (ref >60)
Glucose, Bld: 111 mg/dL — ABNORMAL HIGH (ref 70–99)
Potassium: 3.6 mmol/L (ref 3.5–5.1)
Sodium: 143 mmol/L (ref 135–145)
Total Bilirubin: 0.3 mg/dL (ref <1.2)
Total Protein: 7.1 g/dL (ref 6.5–8.1)

## 2023-06-16 LAB — URINE DRUG SCREEN, QUALITATIVE (ARMC ONLY)
Amphetamines, Ur Screen: POSITIVE — AB
Barbiturates, Ur Screen: NOT DETECTED
Benzodiazepine, Ur Scrn: POSITIVE — AB
Cannabinoid 50 Ng, Ur ~~LOC~~: NOT DETECTED
Cocaine Metabolite,Ur ~~LOC~~: NOT DETECTED
MDMA (Ecstasy)Ur Screen: NOT DETECTED
Methadone Scn, Ur: NOT DETECTED
Opiate, Ur Screen: NOT DETECTED
Phencyclidine (PCP) Ur S: NOT DETECTED
Tricyclic, Ur Screen: NOT DETECTED

## 2023-06-16 LAB — SALICYLATE LEVEL: Salicylate Lvl: <7 mg/dL — ABNORMAL LOW (ref 7.0–30.0)

## 2023-06-16 LAB — CBC
HCT: 39.6 % (ref 36.0–46.0)
Hemoglobin: 13.4 g/dL (ref 12.0–15.0)
MCH: 31.3 pg (ref 26.0–34.0)
MCHC: 33.8 g/dL (ref 30.0–36.0)
MCV: 92.5 fL (ref 80.0–100.0)
Platelets: 274 10*3/uL (ref 150–400)
RBC: 4.28 MIL/uL (ref 3.87–5.11)
RDW: 12.6 % (ref 11.5–15.5)
WBC: 7.1 10*3/uL (ref 4.0–10.5)
nRBC: 0 % (ref 0.0–0.2)

## 2023-06-16 LAB — URINALYSIS, W/ REFLEX TO CULTURE (INFECTION SUSPECTED)
RBC / HPF: >50 RBC/hpf (ref 0–5)
WBC, UA: >50 WBC/hpf (ref 0–5)

## 2023-06-16 LAB — ACETAMINOPHEN LEVEL: Acetaminophen (Tylenol), Serum: <10 ug/mL — ABNORMAL LOW (ref 10–30)

## 2023-06-16 LAB — ETHANOL: Alcohol, Ethyl (B): 260 mg/dL — ABNORMAL HIGH (ref <10)

## 2023-06-16 MED ORDER — CEFTRIAXONE SODIUM 1 G IJ SOLR
1.0000 g | Freq: Once | INTRAMUSCULAR | Status: AC
  Administered 2023-06-16: 1 g via INTRAMUSCULAR
  Filled 2023-06-16 (×2): qty 10

## 2023-06-16 MED ORDER — CEPHALEXIN 500 MG PO CAPS: 500.0000 mg | ORAL_CAPSULE | Freq: Two times a day (BID) | ORAL | 0 refills | Status: AC

## 2023-06-16 NOTE — ED Notes (Addendum)
Pt states no one to call to pick pt up. Pt gave this RN consent to attempt contact with emergency contact - no answer.

## 2023-06-16 NOTE — ED Notes (Signed)
Pt awake, A&OX4, ambulates safely. Pt eating sandwich tray and drink. Pt wanting to take [employer] bus home. RN assisted to see what time route leaves from bus stop at [facility].

## 2023-06-16 NOTE — ED Notes (Signed)
Belongings returned to pt and pt redressed.

## 2023-06-16 NOTE — ED Notes (Signed)
Discharged to lobby to wait for ride who confirms will be here in 1 hour. Left with all of belongings.

## 2023-06-16 NOTE — ED Provider Notes (Addendum)
[facility] Provider Note    Event Date/Time   First MD Initiated Contact with Patient 06/16/23 0159     (approximate)   History   Alcohol Intoxication and Delusional   HPI  Kaylee [name] is a 62 y.o. female who presents to the ED for evaluation of Alcohol Intoxication and Delusional   Review of PCP visit from 3 months ago.  Seen for various chronic complaints.  History of bipolar disorder, ongoing delusional disorder.  Chronic left leg pain after an episode of necrotizing fasciitis with various surgeries in 2022.  Patient presents to the ED via EMS from outside her residence.  She tells me that she has 1 set of stairs to climb to get up to her apartment and she is having a lot of of left leg chronic pain and was having trouble getting up the steps and started calling out for help and someone called 911.  She reports a fall backwards that occurred 14 days ago.  She was try to change some blinds on a window, she was not up on any steps but reaching upwards on her tiptoes when she fell backwards and struck her tailbone on a wooden floor 2 weeks ago.  No more recent injuries.  She has been ambulatory since then but reports "my ass hurts."  She is uncertain why she is here and reports she needs to see a surgeon due to the chronic pain of her left leg.  She reports she was just having trouble getting up the steps at her house and "someone brought me here, I do not know."   Physical Exam   Triage Vital Signs: ED Triage Vitals [06/16/23 0231]  Encounter Vitals Group     BP 106/62     Systolic BP Percentile      Diastolic BP Percentile      Pulse Rate (!) 102     Resp 19     Temp 97.7 F (36.5 C)     Temp Source Oral     SpO2 98 %     Weight      Height      Head Circumference      Peak Flow      Pain Score      Pain Loc      Pain Education      Exclude from Growth Chart     Most recent vital signs: Vitals:   06/16/23 0231  BP: 106/62   Pulse: (!) 102  Resp: 19  Temp: 97.7 F (36.5 C)  SpO2: 98%    General: Awake, no distress.  Freely moving all 4 extremities without apparent deficit CV:  Good peripheral perfusion.  Resp:  Normal effort.  Abd:  No distention.  MSK:  No deformity noted.  Well-healed surgical incisions of the left leg.  Small superficial ulcerative wound to the medial aspect of the left ankle appears chronic without evidence of superimposed infection. Neuro:  No focal deficits appreciated. Other:     ED Results / Procedures / Treatments   Labs (all labs ordered are listed, but only abnormal results are displayed) Labs Reviewed  COMPREHENSIVE METABOLIC PANEL  ETHANOL  SALICYLATE LEVEL  ACETAMINOPHEN LEVEL  CBC  URINE DRUG SCREEN, QUALITATIVE ([facility] ONLY)    EKG   RADIOLOGY   Official radiology report(s): No results found.  PROCEDURES and INTERVENTIONS:  Procedures  Medications - No data to display   IMPRESSION / MDM / ASSESSMENT AND PLAN / ED  COURSE  I reviewed the triage vital signs and the nursing notes.  Differential diagnosis includes, but is not limited to, malingering, trauma, ICH, acute psychoses, polysubstance abuse, overdose  Patient presents to the ED by EMS after having trouble climbing a set of stairs.  She reports various chronic complaints as well as a subacute minor injury to her sacrum.  She has no evidence of neurologic deficits, open injury and she has been ambulatory since this minor trauma 2 weeks ago.  I do not see any indication for imaging of this or any serum workup.  She is suitable for outpatient management.  She eats a full meal tray then falls asleep      FINAL CLINICAL IMPRESSION(S) / ED DIAGNOSES   Final diagnoses:  Alcoholic intoxication without complication (HCC)  Malingering  Encounter for medical screening examination     Rx / DC Orders   ED Discharge Orders     None        Note:  This document was prepared using Dragon voice  recognition software and may include unintentional dictation errors.   Delton Prairie, MD 06/16/23 1610    Delton Prairie, MD 06/16/23 [phone]

## 2023-06-16 NOTE — ED Notes (Signed)
Pt changed into burgundy scrubs by this RN. Pt belongings placed into single belonging bag and labeled appropriately. Belongings include: two yellow necklaces, two gray necklaces, two stud earrings, two gray dangle earrings, green sports bra, jean pants, pair of socks, pair of boots, sweatshirt, one gray ring, and two gray rings that pt was unable to remove.

## 2023-06-16 NOTE — ED Notes (Signed)
vol
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
## 2023-06-16 NOTE — ED Provider Notes (Signed)
Care assumed of patient from outgoing provider.  See their note for initial history, exam and plan.   Patient was awaiting for discharge.  At time of discharge patient stated that she was having some burning with urination and pain in her back.  Provided a urine sample that had gross blood and was contaminated.  Urine culture was sent.  Given that the patient is having symptoms we will do an IM dose of Rocephin and call in Keflex which was worked for her in the past.  Encouraged to not drink alcohol.  Discussed close follow-up with her primary care physician and instructed to call today for follow-up.  Given return precautions for any ongoing or worsening symptoms.   Corena Herter, MD 06/16/23 [phone]

## 2023-06-21 ENCOUNTER — Other Ambulatory Visit: Payer: Self-pay | Admitting: Family Medicine

## 2023-06-21 DIAGNOSIS — F411 Generalized anxiety disorder: Secondary | ICD-10-CM

## 2023-06-21 DIAGNOSIS — G894 Chronic pain syndrome: Secondary | ICD-10-CM

## 2023-06-21 NOTE — Telephone Encounter (Signed)
Name of Medication:  Diazepam, Oxycodone-APAP Name of Pharmacy: [facility] Last [place] or Written Date and Quantity: 05/26/23      Diazepam- #40      Oxycodone-APAP- #40 Last Office Visit and Type:  03/16/23, med f/u Next Office Visit and Type:  07/13/23, CPE  Last Controlled Substance Agreement Date:  12/26/21 Last UDS: 03/16/23

## 2023-06-24 ENCOUNTER — Other Ambulatory Visit: Payer: Self-pay | Admitting: Family Medicine

## 2023-06-24 DIAGNOSIS — R600 Localized edema: Secondary | ICD-10-CM

## 2023-06-24 DIAGNOSIS — E876 Hypokalemia: Secondary | ICD-10-CM

## 2023-06-24 DIAGNOSIS — Z8619 Personal history of other infectious and parasitic diseases: Secondary | ICD-10-CM

## 2023-06-24 DIAGNOSIS — R059 Cough, unspecified: Secondary | ICD-10-CM

## 2023-06-24 DIAGNOSIS — E782 Mixed hyperlipidemia: Secondary | ICD-10-CM

## 2023-06-24 DIAGNOSIS — J432 Centrilobular emphysema: Secondary | ICD-10-CM

## 2023-06-24 NOTE — Telephone Encounter (Signed)
All last filled 05/20/23 Last OV:  03/16/23, pain mgmt f/u Next OV:  07/13/23, CPE

## 2023-06-25 NOTE — Telephone Encounter (Signed)
ERx continued tapering dose given pt missed last appt and seen twice in ER with substance use concerns.

## 2023-06-25 NOTE — Telephone Encounter (Signed)
ERx 

## 2023-07-13 ENCOUNTER — Encounter: Payer: 59 | Admitting: Family Medicine

## 2023-07-26 ENCOUNTER — Other Ambulatory Visit: Payer: Self-pay | Admitting: Family Medicine

## 2023-07-26 DIAGNOSIS — G894 Chronic pain syndrome: Secondary | ICD-10-CM

## 2023-07-26 DIAGNOSIS — F411 Generalized anxiety disorder: Secondary | ICD-10-CM

## 2023-07-27 NOTE — Telephone Encounter (Signed)
 Multiple no shows, cancellations.  Last seen 03/2023, missed or cancelled subsequent 2 appointments. She needs to schedule f/u appt for future refills.  I have been tapering controlled substances for these reasons.  Unable to contact pt, voice mailbox not set up.  Will stop oxycodone , will drop valium  dose to 5mg  1/2-1 tab BID PRN #20/month with plan to drop to #10 next month then stop altogether.  Will send letter to pt regarding all of this.  She needs OV if desires to continue receiving care at our office.

## 2023-07-27 NOTE — Telephone Encounter (Signed)
 Mailed letter

## 2023-07-29 DIAGNOSIS — R5383 Other fatigue: Secondary | ICD-10-CM | POA: Diagnosis not present

## 2023-07-29 DIAGNOSIS — R112 Nausea with vomiting, unspecified: Secondary | ICD-10-CM | POA: Insufficient documentation

## 2023-07-29 DIAGNOSIS — R825 Elevated urine levels of drugs, medicaments and biological substances: Secondary | ICD-10-CM | POA: Diagnosis not present

## 2023-07-29 DIAGNOSIS — F22 Delusional disorders: Secondary | ICD-10-CM | POA: Diagnosis not present

## 2023-07-29 DIAGNOSIS — J019 Acute sinusitis, unspecified: Secondary | ICD-10-CM | POA: Insufficient documentation

## 2023-07-29 DIAGNOSIS — R531 Weakness: Secondary | ICD-10-CM | POA: Insufficient documentation

## 2023-07-29 DIAGNOSIS — R9431 Abnormal electrocardiogram [ECG] [EKG]: Secondary | ICD-10-CM | POA: Diagnosis not present

## 2023-07-29 LAB — URINALYSIS, ROUTINE W REFLEX MICROSCOPIC
Bilirubin Urine: NEGATIVE
Glucose, UA: NEGATIVE mg/dL
Hgb urine dipstick: NEGATIVE
Ketones, ur: NEGATIVE mg/dL
Leukocytes,Ua: NEGATIVE
Nitrite: NEGATIVE
Protein, ur: NEGATIVE mg/dL
Specific Gravity, Urine: 1.005 (ref 1.005–1.030)
pH: 7 (ref 5.0–8.0)

## 2023-07-29 LAB — CBC WITH DIFFERENTIAL/PLATELET
Abs Immature Granulocytes: 0.01 10*3/uL (ref 0.00–0.07)
Basophils Absolute: 0 10*3/uL (ref 0.0–0.1)
Basophils Relative: 1 %
Eosinophils Absolute: 0.1 10*3/uL (ref 0.0–0.5)
Eosinophils Relative: 2 %
HCT: 40.5 % (ref 36.0–46.0)
Hemoglobin: 13.7 g/dL (ref 12.0–15.0)
Immature Granulocytes: 0 %
Lymphocytes Relative: 44 %
Lymphs Abs: 2.7 10*3/uL (ref 0.7–4.0)
MCH: 31.3 pg (ref 26.0–34.0)
MCHC: 33.8 g/dL (ref 30.0–36.0)
MCV: 92.5 fL (ref 80.0–100.0)
Monocytes Absolute: 0.6 10*3/uL (ref 0.1–1.0)
Monocytes Relative: 10 %
Neutro Abs: 2.6 10*3/uL (ref 1.7–7.7)
Neutrophils Relative %: 43 %
Platelets: 300 10*3/uL (ref 150–400)
RBC: 4.38 MIL/uL (ref 3.87–5.11)
RDW: 12.6 % (ref 11.5–15.5)
WBC: 6.1 10*3/uL (ref 4.0–10.5)
nRBC: 0 % (ref 0.0–0.2)

## 2023-07-29 LAB — URINE DRUG SCREEN, QUALITATIVE (ARMC ONLY)
Amphetamines, Ur Screen: NOT DETECTED
Barbiturates, Ur Screen: NOT DETECTED
Benzodiazepine, Ur Scrn: NOT DETECTED
Cannabinoid 50 Ng, Ur ~~LOC~~: POSITIVE — AB
Cocaine Metabolite,Ur ~~LOC~~: POSITIVE — AB
MDMA (Ecstasy)Ur Screen: NOT DETECTED
Methadone Scn, Ur: NOT DETECTED
Opiate, Ur Screen: NOT DETECTED
Phencyclidine (PCP) Ur S: NOT DETECTED
Tricyclic, Ur Screen: NOT DETECTED

## 2023-07-29 LAB — COMPREHENSIVE METABOLIC PANEL
ALT: 14 U/L (ref 0–44)
AST: 20 U/L (ref 15–41)
Albumin: 4.3 g/dL (ref 3.5–5.0)
Alkaline Phosphatase: 84 U/L (ref 38–126)
Anion gap: 12 (ref 5–15)
BUN: 15 mg/dL (ref 8–23)
CO2: 29 mmol/L (ref 22–32)
Calcium: 9.1 mg/dL (ref 8.9–10.3)
Chloride: 99 mmol/L (ref 98–111)
Creatinine, Ser: 0.79 mg/dL (ref 0.44–1.00)
GFR, Estimated: >60 mL/min (ref >60)
Glucose, Bld: 145 mg/dL — ABNORMAL HIGH (ref 70–99)
Potassium: 3.7 mmol/L (ref 3.5–5.1)
Sodium: 140 mmol/L (ref 135–145)
Total Bilirubin: 0.7 mg/dL (ref 0.0–1.2)
Total Protein: 7.1 g/dL (ref 6.5–8.1)

## 2023-07-29 LAB — LIPASE, BLOOD: Lipase: 27 U/L (ref 11–51)

## 2023-07-29 NOTE — ED Provider Triage Note (Signed)
Emergency Medicine Provider Triage Evaluation Note  Kaylee [name] , a 63 y.o. female  was evaluated in triage.  Pt complains of maggots in her blood. Reports that she has lived in an apartment where there are lots of pigeons in the ceiling and the poop is falling into her house since September. Reports that symptoms have worsened in the past 2-3 weeks, reports n/v/d and feels bloated.  Review of Systems  Positive: N/v/d Negative: fever  Physical Exam  There were no vitals taken for this visit. Gen:   Awake, no distress  \ Resp:  Normal effort  MSK:   Moves extremities without difficulty  Other:  Pressured speech  Medical Decision Making  Medically screening exam initiated at 2:48 PM.  Appropriate orders placed.  Kaylee [name] was informed that the remainder of the evaluation will be completed by another provider, this initial triage assessment does not replace that evaluation, and the importance of remaining in the ED until their evaluation is complete.     Jackelyn Hoehn, PA-C 07/29/23 1450

## 2023-07-30 ENCOUNTER — Other Ambulatory Visit: Payer: Self-pay | Admitting: Family Medicine

## 2023-07-30 ENCOUNTER — Other Ambulatory Visit: Payer: Self-pay

## 2023-07-30 ENCOUNTER — Emergency Department
Admission: EM | Admit: 2023-07-30 | Discharge: 2023-07-30 | Disposition: A | Discharge status: Home / Self Care | Payer: 59 | Attending: Emergency Medicine | Admitting: Emergency Medicine

## 2023-07-30 DIAGNOSIS — J019 Acute sinusitis, unspecified: Secondary | ICD-10-CM

## 2023-07-30 DIAGNOSIS — J329 Chronic sinusitis, unspecified: Secondary | ICD-10-CM

## 2023-07-30 DIAGNOSIS — J432 Centrilobular emphysema: Secondary | ICD-10-CM

## 2023-07-30 DIAGNOSIS — R112 Nausea with vomiting, unspecified: Secondary | ICD-10-CM

## 2023-07-30 MED ORDER — ONDANSETRON HCL 4 MG PO TABS: 4.0000 mg | ORAL_TABLET | Freq: Three times a day (TID) | ORAL | 0 refills | Status: DC | PRN

## 2023-07-30 MED ORDER — DOXYCYCLINE HYCLATE 50 MG PO CAPS: 100.0000 mg | ORAL_CAPSULE | Freq: Two times a day (BID) | ORAL | 0 refills | Status: AC

## 2023-07-30 NOTE — Discharge Instructions (Signed)
Please pick up your medications from the pharmacy and take as prescribed.  Please follow-up with psychiatry outpatient for ongoing care as well.

## 2023-07-30 NOTE — ED Notes (Signed)
Patient given discharge instructions including prescriptions x2 and importance of follow up appt as needed with stated understanding. Patient stable and ambulatory with steady even gait on dispo.

## 2023-07-30 NOTE — ED Provider Notes (Signed)
[facility] Provider Note    Event Date/Time   First MD Initiated Contact with Patient 07/30/23 [phone]     (approximate)   History   Weakness and Fatigue   HPI Kaylee [name] is a 62 y.o. female with history of of anxiety, depression, bipolar, delusional parasitosis presenting today for weakness and fatigue.  Patient states for the past 3 weeks she has felt weak, fatigued, sinus congestion.  Has had associated nausea and vomiting.  Felt bloated.  Otherwise denies chest pain, shortness of breath, fever, diarrhea, constipation, dysuria.  Separately, she also notes that she has bugs crawling on her.  She feels like she has parasites everywhere in her body.  Patient has prior history of delusional parasitosis and states symptoms have been longstanding.     Physical Exam   Triage Vital Signs: ED Triage Vitals  Encounter Vitals Group     BP 07/29/23 1450 (!) 157/118     Systolic BP Percentile --      Diastolic BP Percentile --      Pulse Rate 07/29/23 1450 83     Resp 07/29/23 1450 18     Temp 07/29/23 1450 98.2 F (36.8 C)     Temp Source 07/29/23 1450 Oral     SpO2 07/29/23 1450 100 %     Weight --      Height --      Head Circumference --      Peak Flow --      Pain Score 07/29/23 1455 0     Pain Loc --      Pain Education --      Exclude from Growth Chart --     Most recent vital signs: Vitals:   07/29/23 1810 07/30/23 0023  BP: (!) 178/87 (!) 150/80  Pulse: 74 73  Resp: 18 18  Temp: 98 F (36.7 C) 97.8 F (36.6 C)  SpO2: 96% 98%   I have reviewed the vital signs. General:  Awake, alert, no acute distress.  Slightly pressured speech and continuously scratching all areas of her body. Head:  Normocephalic, Atraumatic. EENT:  PERRL, EOMI, Oral mucosa pink and moist, Neck is supple. Cardiovascular: Regular rate, 2+ distal pulses. Respiratory:  Normal respiratory effort, symmetrical expansion, no distress.   Extremities:  Moving all four  extremities through full ROM without pain.   Neuro:  Alert and oriented.  Interacting appropriately.   Skin:  Warm, dry, no rash.   Psych: Appropriate affect.    ED Results / Procedures / Treatments   Labs (all labs ordered are listed, but only abnormal results are displayed) Labs Reviewed  COMPREHENSIVE METABOLIC PANEL - Abnormal; Notable for the following components:      Result Value   Glucose, Bld 145 (*)    All other components within normal limits  URINALYSIS, ROUTINE W REFLEX MICROSCOPIC - Abnormal; Notable for the following components:   Color, Urine COLORLESS (*)    APPearance CLEAR (*)    All other components within normal limits  URINE DRUG SCREEN, QUALITATIVE ([facility] ONLY) - Abnormal; Notable for the following components:   Cocaine Metabolite,Ur [place] POSITIVE (*)    Cannabinoid 50 Ng, Ur [place] POSITIVE (*)    All other components within normal limits  CBC WITH DIFFERENTIAL/PLATELET  LIPASE, BLOOD     EKG My EKG interpretation: Rate of 77, normal sinus rhythm, normal axis, normal intervals.  No acute ST elevations or depressions   RADIOLOGY    PROCEDURES:  Critical Care performed: No  Procedures   MEDICATIONS ORDERED IN ED: Medications - No data to display   IMPRESSION / MDM / ASSESSMENT AND PLAN / ED COURSE  I reviewed the triage vital signs and the nursing notes.                              Differential diagnosis includes, but is not limited to, subacute sinusitis, viral sinusitis, worsening depression, worsening delusional parasitosis, bipolar  Patient's presentation is most consistent with acute complicated illness / injury requiring diagnostic workup.  Patient is a 63 year old female presenting today for multiple complaints.  Regarding her sinus congestion, intermittent nausea, and vomiting, her laboratory workup today is largely reassuring with normal CBC, CMP, lipase.  UA negative.  UDS is positive for cocaine and cannabinoids.  She does appear to  have some congestion and given the duration, we will start her on doxycycline as she has been on Augmentin in the past without success.  Will also send her home with Zofran to treat her nausea symptoms.  She also is having ongoing symptoms of mites and other bugs crawling through her skin in her body.  She states this is longstanding and does have prior diagnosis of delusional parasitosis which I believe is continued today.  She does not want to talk with psychiatry and denies SI or HI.  She just wants to follow-up with psychiatry outpatient.  Given her referral and additional paperwork to see psychiatry outpatient which she is agreeable with.      FINAL CLINICAL IMPRESSION(S) / ED DIAGNOSES   Final diagnoses:  Nausea and vomiting, unspecified vomiting type  Subacute sinusitis, unspecified location     Rx / DC Orders   ED Discharge Orders          Ordered    ondansetron (ZOFRAN) 4 MG tablet  Every 8 hours PRN        07/30/23 0341    doxycycline (VIBRAMYCIN) 50 MG capsule  2 times daily        07/30/23 0341    Ambulatory referral to Psychiatry        07/30/23 0341             Note:  This document was prepared using Dragon voice recognition software and may include unintentional dictation errors.   Janith Lima, MD 07/30/23 [phone]

## 2023-07-31 ENCOUNTER — Emergency Department
Admission: EM | Admit: 2023-07-31 | Discharge: 2023-07-31 | Disposition: A | Discharge status: Home / Self Care | Payer: 59 | Attending: Emergency Medicine | Admitting: Emergency Medicine

## 2023-07-31 DIAGNOSIS — S81802A Unspecified open wound, left lower leg, initial encounter: Secondary | ICD-10-CM | POA: Insufficient documentation

## 2023-07-31 DIAGNOSIS — J449 Chronic obstructive pulmonary disease, unspecified: Secondary | ICD-10-CM | POA: Diagnosis not present

## 2023-07-31 DIAGNOSIS — T148XXA Other injury of unspecified body region, initial encounter: Secondary | ICD-10-CM

## 2023-07-31 DIAGNOSIS — M79605 Pain in left leg: Secondary | ICD-10-CM | POA: Diagnosis not present

## 2023-07-31 DIAGNOSIS — F22 Delusional disorders: Secondary | ICD-10-CM | POA: Insufficient documentation

## 2023-07-31 DIAGNOSIS — S81802S Unspecified open wound, left lower leg, sequela: Secondary | ICD-10-CM | POA: Diagnosis not present

## 2023-07-31 DIAGNOSIS — X58XXXA Exposure to other specified factors, initial encounter: Secondary | ICD-10-CM | POA: Insufficient documentation
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
## 2023-07-31 NOTE — ED Notes (Signed)
Pts Lt medial ankle wound was dressed with xeroform petrolatum dressing, kerlex and ace wrap. Pt states it does not hurt. Pt content with wrapping.

## 2023-07-31 NOTE — ED Notes (Signed)
Pt refuses discharge vitals. Pt states she already knows her vitals.

## 2023-07-31 NOTE — ED Triage Notes (Addendum)
Pt to ed from home via POV for "I think I'm septic". Pt was here this morning and was worked up and was "fine" per the pt and she was prescribed meds. Pt was told to go to her PCP if anything changed. Pt states "I threw up maggots". Pt has no proof of same. Pt states "you will see them soon they will come out of the covers". Pt is caox4, in no acute distress and ambulatory in triage. Per pt chart for one of her last visits she was diagnose with "Ekbom delusional parasitosis". Otherwise, pt is making perfect sense other than these delusions of worms. Pt denies any SI.

## 2023-08-12 ENCOUNTER — Telehealth (HOSPITAL_COMMUNITY): Payer: Self-pay | Admitting: Psychiatry

## 2023-08-12 ENCOUNTER — Ambulatory Visit (HOSPITAL_COMMUNITY): Payer: 59 | Admitting: Psychiatry

## 2023-08-12 ENCOUNTER — Telehealth: Payer: Self-pay

## 2023-08-12 DIAGNOSIS — F22 Delusional disorders: Secondary | ICD-10-CM

## 2023-08-12 DIAGNOSIS — F319 Bipolar disorder, unspecified: Secondary | ICD-10-CM

## 2023-08-12 NOTE — Telephone Encounter (Signed)
Copied from CRM [id number]. Topic: Referral - Request for Referral >> Aug 12, 2023  2:49 PM Sonny Dandy B wrote: Did the patient discuss referral with their provider in the last year? Yes yes (If No - schedule appointment) (If Yes - send message)  Appointment offered? No  Type of order/referral and detailed reason for visit:  mental health provider in [employer] of office, provider, location: [place]   If referral order, have you been seen by this specialty before? No (If Yes, this issue or another issue? When? Where?  Can we respond through MyChart? No

## 2023-08-12 NOTE — Telephone Encounter (Signed)
Patient was scheduled for 1/30 for an in person  1 pm New Patient 59 appointment with Dr.Arfeen. Per Dr. Lolly Mustache, patient's appointment had to be rescheduled due to a 1 pm-2-pm meeting. Patient was called on 1/29, no answer and voicemail box was not set up. Patient arrives to appointment for 1 pm and stated she did not have access to a phone. Patient was told her appointment was cancelled and rescheduled to the next available in person slot 3/20. Patient stated she is very upset and does not know what to do about her medications. Patient stated she had to take an uber for her appointment and stated she can not wait until march for an appointment. Patient was offered virtual appointment, but does not have access to a computer or phone for appointment and can only see provider in person. Patient requested her PCP be notified of her appointment being cancelled so that she can possibly get an extension on her medications. Office contacted PCP and informed them of the patient's current state.
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
## 2023-08-15 ENCOUNTER — Other Ambulatory Visit: Payer: Self-pay

## 2023-08-15 ENCOUNTER — Emergency Department: Payer: 59

## 2023-08-15 DIAGNOSIS — I4892 Unspecified atrial flutter: Secondary | ICD-10-CM | POA: Diagnosis present

## 2023-08-15 DIAGNOSIS — J90 Pleural effusion, not elsewhere classified: Secondary | ICD-10-CM | POA: Diagnosis not present

## 2023-08-15 DIAGNOSIS — F13239 Sedative, hypnotic or anxiolytic dependence with withdrawal, unspecified: Secondary | ICD-10-CM | POA: Diagnosis present

## 2023-08-15 DIAGNOSIS — J1001 Influenza due to other identified influenza virus with the same other identified influenza virus pneumonia: Secondary | ICD-10-CM | POA: Diagnosis present

## 2023-08-15 DIAGNOSIS — F411 Generalized anxiety disorder: Secondary | ICD-10-CM | POA: Diagnosis present

## 2023-08-15 DIAGNOSIS — K580 Irritable bowel syndrome with diarrhea: Secondary | ICD-10-CM | POA: Diagnosis present

## 2023-08-15 DIAGNOSIS — Z811 Family history of alcohol abuse and dependence: Secondary | ICD-10-CM

## 2023-08-15 DIAGNOSIS — J09X1 Influenza due to identified novel influenza A virus with pneumonia: Secondary | ICD-10-CM | POA: Diagnosis not present

## 2023-08-15 DIAGNOSIS — Z888 Allergy status to other drugs, medicaments and biological substances status: Secondary | ICD-10-CM

## 2023-08-15 DIAGNOSIS — E785 Hyperlipidemia, unspecified: Secondary | ICD-10-CM | POA: Diagnosis present

## 2023-08-15 DIAGNOSIS — R042 Hemoptysis: Secondary | ICD-10-CM | POA: Diagnosis not present

## 2023-08-15 DIAGNOSIS — J189 Pneumonia, unspecified organism: Secondary | ICD-10-CM | POA: Diagnosis not present

## 2023-08-15 DIAGNOSIS — G9341 Metabolic encephalopathy: Secondary | ICD-10-CM | POA: Diagnosis not present

## 2023-08-15 DIAGNOSIS — J850 Gangrene and necrosis of lung: Secondary | ICD-10-CM | POA: Diagnosis present

## 2023-08-15 DIAGNOSIS — R6521 Severe sepsis with septic shock: Secondary | ICD-10-CM | POA: Diagnosis present

## 2023-08-15 DIAGNOSIS — Z91419 Personal history of unspecified adult abuse: Secondary | ICD-10-CM

## 2023-08-15 DIAGNOSIS — I480 Paroxysmal atrial fibrillation: Secondary | ICD-10-CM | POA: Diagnosis present

## 2023-08-15 DIAGNOSIS — F19959 Other psychoactive substance use, unspecified with psychoactive substance-induced psychotic disorder, unspecified: Secondary | ICD-10-CM | POA: Diagnosis not present

## 2023-08-15 DIAGNOSIS — A4102 Sepsis due to Methicillin resistant Staphylococcus aureus: Principal | ICD-10-CM | POA: Diagnosis present

## 2023-08-15 DIAGNOSIS — Z7901 Long term (current) use of anticoagulants: Secondary | ICD-10-CM

## 2023-08-15 DIAGNOSIS — Z820 Family history of epilepsy and other diseases of the nervous system: Secondary | ICD-10-CM

## 2023-08-15 DIAGNOSIS — E871 Hypo-osmolality and hyponatremia: Secondary | ICD-10-CM | POA: Diagnosis not present

## 2023-08-15 DIAGNOSIS — J984 Other disorders of lung: Secondary | ICD-10-CM | POA: Diagnosis not present

## 2023-08-15 DIAGNOSIS — R918 Other nonspecific abnormal finding of lung field: Secondary | ICD-10-CM | POA: Diagnosis not present

## 2023-08-15 DIAGNOSIS — I351 Nonrheumatic aortic (valve) insufficiency: Secondary | ICD-10-CM | POA: Diagnosis not present

## 2023-08-15 DIAGNOSIS — Z87440 Personal history of urinary (tract) infections: Secondary | ICD-10-CM

## 2023-08-15 DIAGNOSIS — Z8249 Family history of ischemic heart disease and other diseases of the circulatory system: Secondary | ICD-10-CM

## 2023-08-15 DIAGNOSIS — Z803 Family history of malignant neoplasm of breast: Secondary | ICD-10-CM

## 2023-08-15 DIAGNOSIS — F3110 Bipolar disorder, current episode manic without psychotic features, unspecified: Secondary | ICD-10-CM | POA: Diagnosis present

## 2023-08-15 DIAGNOSIS — R0902 Hypoxemia: Secondary | ICD-10-CM | POA: Diagnosis not present

## 2023-08-15 DIAGNOSIS — M0600 Rheumatoid arthritis without rheumatoid factor, unspecified site: Secondary | ICD-10-CM | POA: Diagnosis present

## 2023-08-15 DIAGNOSIS — I959 Hypotension, unspecified: Secondary | ICD-10-CM | POA: Diagnosis not present

## 2023-08-15 DIAGNOSIS — Z4682 Encounter for fitting and adjustment of non-vascular catheter: Secondary | ICD-10-CM | POA: Diagnosis not present

## 2023-08-15 DIAGNOSIS — J9602 Acute respiratory failure with hypercapnia: Secondary | ICD-10-CM | POA: Diagnosis not present

## 2023-08-15 DIAGNOSIS — J9601 Acute respiratory failure with hypoxia: Secondary | ICD-10-CM | POA: Diagnosis not present

## 2023-08-15 DIAGNOSIS — I9589 Other hypotension: Secondary | ICD-10-CM | POA: Diagnosis not present

## 2023-08-15 DIAGNOSIS — J1008 Influenza due to other identified influenza virus with other specified pneumonia: Secondary | ICD-10-CM | POA: Diagnosis present

## 2023-08-15 DIAGNOSIS — Z882 Allergy status to sulfonamides status: Secondary | ICD-10-CM

## 2023-08-15 DIAGNOSIS — Z452 Encounter for adjustment and management of vascular access device: Secondary | ICD-10-CM | POA: Diagnosis not present

## 2023-08-15 DIAGNOSIS — R739 Hyperglycemia, unspecified: Secondary | ICD-10-CM | POA: Diagnosis not present

## 2023-08-15 DIAGNOSIS — Z5982 Transportation insecurity: Secondary | ICD-10-CM

## 2023-08-15 DIAGNOSIS — Z751 Person awaiting admission to adequate facility elsewhere: Secondary | ICD-10-CM

## 2023-08-15 DIAGNOSIS — J440 Chronic obstructive pulmonary disease with acute lower respiratory infection: Secondary | ICD-10-CM | POA: Diagnosis not present

## 2023-08-15 DIAGNOSIS — A419 Sepsis, unspecified organism: Secondary | ICD-10-CM | POA: Diagnosis not present

## 2023-08-15 DIAGNOSIS — Z800 Family history of malignant neoplasm of digestive organs: Secondary | ICD-10-CM

## 2023-08-15 DIAGNOSIS — Z87891 Personal history of nicotine dependence: Secondary | ICD-10-CM

## 2023-08-15 DIAGNOSIS — J811 Chronic pulmonary edema: Secondary | ICD-10-CM | POA: Diagnosis not present

## 2023-08-15 DIAGNOSIS — J15212 Pneumonia due to Methicillin resistant Staphylococcus aureus: Secondary | ICD-10-CM | POA: Diagnosis present

## 2023-08-15 DIAGNOSIS — Z90710 Acquired absence of both cervix and uterus: Secondary | ICD-10-CM

## 2023-08-15 DIAGNOSIS — R0602 Shortness of breath: Secondary | ICD-10-CM | POA: Diagnosis not present

## 2023-08-15 DIAGNOSIS — R0989 Other specified symptoms and signs involving the circulatory and respiratory systems: Secondary | ICD-10-CM | POA: Diagnosis not present

## 2023-08-15 DIAGNOSIS — E1165 Type 2 diabetes mellitus with hyperglycemia: Secondary | ICD-10-CM | POA: Diagnosis present

## 2023-08-15 DIAGNOSIS — J441 Chronic obstructive pulmonary disease with (acute) exacerbation: Secondary | ICD-10-CM | POA: Diagnosis present

## 2023-08-15 DIAGNOSIS — E876 Hypokalemia: Secondary | ICD-10-CM | POA: Diagnosis present

## 2023-08-15 DIAGNOSIS — N179 Acute kidney failure, unspecified: Secondary | ICD-10-CM | POA: Diagnosis not present

## 2023-08-15 DIAGNOSIS — Z79899 Other long term (current) drug therapy: Secondary | ICD-10-CM

## 2023-08-15 DIAGNOSIS — Z87410 Personal history of cervical dysplasia: Secondary | ICD-10-CM

## 2023-08-15 DIAGNOSIS — J129 Viral pneumonia, unspecified: Secondary | ICD-10-CM | POA: Diagnosis not present

## 2023-08-15 DIAGNOSIS — G894 Chronic pain syndrome: Secondary | ICD-10-CM | POA: Diagnosis present

## 2023-08-15 DIAGNOSIS — Z91148 Patient's other noncompliance with medication regimen for other reason: Secondary | ICD-10-CM

## 2023-08-15 DIAGNOSIS — Z66 Do not resuscitate: Secondary | ICD-10-CM | POA: Diagnosis not present

## 2023-08-15 DIAGNOSIS — F22 Delusional disorders: Secondary | ICD-10-CM | POA: Diagnosis not present

## 2023-08-15 DIAGNOSIS — J9691 Respiratory failure, unspecified with hypoxia: Secondary | ICD-10-CM | POA: Diagnosis not present

## 2023-08-15 DIAGNOSIS — I10 Essential (primary) hypertension: Secondary | ICD-10-CM | POA: Diagnosis present

## 2023-08-15 DIAGNOSIS — J918 Pleural effusion in other conditions classified elsewhere: Secondary | ICD-10-CM | POA: Diagnosis not present

## 2023-08-15 DIAGNOSIS — Z62810 Personal history of physical and sexual abuse in childhood: Secondary | ICD-10-CM

## 2023-08-15 DIAGNOSIS — Z885 Allergy status to narcotic agent status: Secondary | ICD-10-CM

## 2023-08-15 DIAGNOSIS — R6520 Severe sepsis without septic shock: Secondary | ICD-10-CM | POA: Diagnosis not present

## 2023-08-15 DIAGNOSIS — F15959 Other stimulant use, unspecified with stimulant-induced psychotic disorder, unspecified: Secondary | ICD-10-CM | POA: Diagnosis present

## 2023-08-15 DIAGNOSIS — R059 Cough, unspecified: Secondary | ICD-10-CM | POA: Diagnosis not present

## 2023-08-15 DIAGNOSIS — J449 Chronic obstructive pulmonary disease, unspecified: Secondary | ICD-10-CM | POA: Diagnosis not present

## 2023-08-15 DIAGNOSIS — I4891 Unspecified atrial fibrillation: Secondary | ICD-10-CM | POA: Diagnosis not present

## 2023-08-15 DIAGNOSIS — F319 Bipolar disorder, unspecified: Secondary | ICD-10-CM | POA: Diagnosis not present

## 2023-08-15 DIAGNOSIS — N3020 Other chronic cystitis without hematuria: Secondary | ICD-10-CM | POA: Diagnosis present

## 2023-08-15 DIAGNOSIS — R140 Abdominal distension (gaseous): Secondary | ICD-10-CM | POA: Diagnosis not present

## 2023-08-15 DIAGNOSIS — Z7982 Long term (current) use of aspirin: Secondary | ICD-10-CM

## 2023-08-15 DIAGNOSIS — J111 Influenza due to unidentified influenza virus with other respiratory manifestations: Secondary | ICD-10-CM | POA: Diagnosis not present

## 2023-08-15 DIAGNOSIS — K219 Gastro-esophageal reflux disease without esophagitis: Secondary | ICD-10-CM | POA: Diagnosis present

## 2023-08-15 DIAGNOSIS — J09X2 Influenza due to identified novel influenza A virus with other respiratory manifestations: Secondary | ICD-10-CM | POA: Diagnosis not present

## 2023-08-15 DIAGNOSIS — I517 Cardiomegaly: Secondary | ICD-10-CM | POA: Diagnosis not present

## 2023-08-15 DIAGNOSIS — R9431 Abnormal electrocardiogram [ECG] [EKG]: Secondary | ICD-10-CM | POA: Diagnosis not present

## 2023-08-15 DIAGNOSIS — J869 Pyothorax without fistula: Secondary | ICD-10-CM | POA: Diagnosis not present

## 2023-08-15 DIAGNOSIS — Z6829 Body mass index (BMI) 29.0-29.9, adult: Secondary | ICD-10-CM

## 2023-08-15 DIAGNOSIS — Z5986 Financial insecurity: Secondary | ICD-10-CM

## 2023-08-15 DIAGNOSIS — Z794 Long term (current) use of insulin: Secondary | ICD-10-CM

## 2023-08-15 DIAGNOSIS — R000 Tachycardia, unspecified: Secondary | ICD-10-CM | POA: Diagnosis not present

## 2023-08-15 DIAGNOSIS — Z560 Unemployment, unspecified: Secondary | ICD-10-CM

## 2023-08-15 HISTORY — DX: Acute respiratory failure with hypercapnia: R65.21

## 2023-08-15 HISTORY — DX: Sepsis due to methicillin resistant Staphylococcus aureus: A41.02

## 2023-08-15 NOTE — ED Triage Notes (Signed)
Pt presents via EMS from home c/o "flu like symptoms"  Pt also states has "worms" all over her body. Pt has previous hx of similar complaints of worms.   VSS per EMS.

## 2023-08-15 NOTE — ED Triage Notes (Addendum)
Patient C/O cough for a month. Patient is erratic in triage and states that she has bugs and mites everywhere. Denies any fevers, nausea, or vomiting. She is tachycardic in triage, but she is also unable to sit still. Denies chest pain or shortness of breath.

## 2023-08-16 ENCOUNTER — Other Ambulatory Visit: Payer: Self-pay

## 2023-08-16 ENCOUNTER — Emergency Department: Payer: 59

## 2023-08-16 ENCOUNTER — Inpatient Hospital Stay
Admission: EM | Admit: 2023-08-16 | Discharge: 2023-08-24 | DRG: 870 | Disposition: A | Discharge status: Other Acute Inpatient Hospital | Payer: 59 | Attending: Pulmonary Disease | Admitting: Pulmonary Disease

## 2023-08-16 DIAGNOSIS — F40218 Other animal type phobia: Secondary | ICD-10-CM

## 2023-08-16 DIAGNOSIS — J111 Influenza due to unidentified influenza virus with other respiratory manifestations: Principal | ICD-10-CM

## 2023-08-16 DIAGNOSIS — E871 Hypo-osmolality and hyponatremia: Secondary | ICD-10-CM

## 2023-08-16 DIAGNOSIS — A419 Sepsis, unspecified organism: Secondary | ICD-10-CM | POA: Diagnosis present

## 2023-08-16 DIAGNOSIS — J9601 Acute respiratory failure with hypoxia: Secondary | ICD-10-CM | POA: Diagnosis present

## 2023-08-16 DIAGNOSIS — F19959 Other psychoactive substance use, unspecified with psychoactive substance-induced psychotic disorder, unspecified: Secondary | ICD-10-CM | POA: Insufficient documentation

## 2023-08-16 DIAGNOSIS — J189 Pneumonia, unspecified organism: Secondary | ICD-10-CM

## 2023-08-16 DIAGNOSIS — F22 Delusional disorders: Secondary | ICD-10-CM | POA: Diagnosis present

## 2023-08-16 DIAGNOSIS — J869 Pyothorax without fistula: Secondary | ICD-10-CM

## 2023-08-16 DIAGNOSIS — I4891 Unspecified atrial fibrillation: Secondary | ICD-10-CM

## 2023-08-16 DIAGNOSIS — F319 Bipolar disorder, unspecified: Secondary | ICD-10-CM | POA: Diagnosis not present

## 2023-08-16 HISTORY — DX: Chest pain, unspecified: R07.9

## 2023-08-16 HISTORY — DX: Personal history of other medical treatment: Z92.89

## 2023-08-16 LAB — CBC WITH DIFFERENTIAL/PLATELET
Abs Immature Granulocytes: 0.03 10*3/uL (ref 0.00–0.07)
Basophils Absolute: 0 10*3/uL (ref 0.0–0.1)
Basophils Relative: 0 %
Eosinophils Absolute: 0 10*3/uL (ref 0.0–0.5)
Eosinophils Relative: 0 %
HCT: 38.5 % (ref 36.0–46.0)
Hemoglobin: 13.6 g/dL (ref 12.0–15.0)
Immature Granulocytes: 0 %
Lymphocytes Relative: 15 %
Lymphs Abs: 1.1 10*3/uL (ref 0.7–4.0)
MCH: 31.1 pg (ref 26.0–34.0)
MCHC: 35.3 g/dL (ref 30.0–36.0)
MCV: 88.1 fL (ref 80.0–100.0)
Monocytes Absolute: 0.7 10*3/uL (ref 0.1–1.0)
Monocytes Relative: 10 %
Neutro Abs: 5.6 10*3/uL (ref 1.7–7.7)
Neutrophils Relative %: 75 %
Platelets: 245 10*3/uL (ref 150–400)
RBC: 4.37 MIL/uL (ref 3.87–5.11)
RDW: 11.7 % (ref 11.5–15.5)
WBC: 7.5 10*3/uL (ref 4.0–10.5)
nRBC: 0 % (ref 0.0–0.2)

## 2023-08-16 LAB — COMPREHENSIVE METABOLIC PANEL
ALT: 21 U/L (ref 0–44)
AST: 33 U/L (ref 15–41)
Albumin: 3.8 g/dL (ref 3.5–5.0)
Alkaline Phosphatase: 64 U/L (ref 38–126)
Anion gap: 12 (ref 5–15)
BUN: 18 mg/dL (ref 8–23)
CO2: 26 mmol/L (ref 22–32)
Calcium: 8.3 mg/dL — ABNORMAL LOW (ref 8.9–10.3)
Chloride: 97 mmol/L — ABNORMAL LOW (ref 98–111)
Creatinine, Ser: 0.86 mg/dL (ref 0.44–1.00)
GFR, Estimated: >60 mL/min (ref >60)
Glucose, Bld: 134 mg/dL — ABNORMAL HIGH (ref 70–99)
Potassium: 3.1 mmol/L — ABNORMAL LOW (ref 3.5–5.1)
Sodium: 135 mmol/L (ref 135–145)
Total Bilirubin: 0.7 mg/dL (ref 0.0–1.2)
Total Protein: 6.8 g/dL (ref 6.5–8.1)

## 2023-08-16 LAB — URINE DRUG SCREEN, QUALITATIVE (ARMC ONLY)
Amphetamines, Ur Screen: POSITIVE — AB
Barbiturates, Ur Screen: NOT DETECTED
Benzodiazepine, Ur Scrn: NOT DETECTED
Cannabinoid 50 Ng, Ur ~~LOC~~: NOT DETECTED
Cocaine Metabolite,Ur ~~LOC~~: NOT DETECTED
MDMA (Ecstasy)Ur Screen: NOT DETECTED
Methadone Scn, Ur: NOT DETECTED
Opiate, Ur Screen: NOT DETECTED
Phencyclidine (PCP) Ur S: NOT DETECTED
Tricyclic, Ur Screen: NOT DETECTED

## 2023-08-16 LAB — RESP PANEL BY RT-PCR (RSV, FLU A&B, COVID)  RVPGX2
Influenza A by PCR: POSITIVE — AB
Influenza B by PCR: NEGATIVE
Resp Syncytial Virus by PCR: NEGATIVE
SARS Coronavirus 2 by RT PCR: NEGATIVE

## 2023-08-16 LAB — SALICYLATE LEVEL: Salicylate Lvl: <7 mg/dL — ABNORMAL LOW (ref 7.0–30.0)

## 2023-08-16 LAB — TROPONIN I (HIGH SENSITIVITY): Troponin I (High Sensitivity): 11 ng/L (ref <18)

## 2023-08-16 LAB — ACETAMINOPHEN LEVEL: Acetaminophen (Tylenol), Serum: <10 ug/mL — ABNORMAL LOW (ref 10–30)

## 2023-08-16 MED ORDER — OSELTAMIVIR PHOSPHATE 75 MG PO CAPS
75.0000 mg | ORAL_CAPSULE | Freq: Two times a day (BID) | ORAL | Status: AC
  Administered 2023-08-16 – 2023-08-18 (×5): 75 mg via ORAL
  Filled 2023-08-16 (×7): qty 1

## 2023-08-16 MED ORDER — ONDANSETRON HCL 4 MG/2ML IJ SOLN: 4.0000 mg | Freq: Once | INTRAMUSCULAR | Status: DC

## 2023-08-16 MED ORDER — BENZONATATE 100 MG PO CAPS
100.0000 mg | ORAL_CAPSULE | Freq: Two times a day (BID) | ORAL | Status: DC | PRN
  Administered 2023-08-16 – 2023-08-18 (×3): 100 mg via ORAL
  Filled 2023-08-16 (×4): qty 1

## 2023-08-16 MED ORDER — LACTATED RINGERS IV BOLUS
1000.0000 mL | Freq: Once | INTRAVENOUS | Status: AC
Start: 2023-08-16 — End: 2023-08-16
  Administered 2023-08-16: 1000 mL via INTRAVENOUS

## 2023-08-16 MED ORDER — IBUPROFEN 600 MG PO TABS
600.0000 mg | ORAL_TABLET | Freq: Four times a day (QID) | ORAL | Status: AC | PRN
  Administered 2023-08-16: 600 mg via ORAL
  Filled 2023-08-16: qty 1

## 2023-08-16 MED ORDER — ONDANSETRON HCL 4 MG/2ML IJ SOLN
4.0000 mg | Freq: Once | INTRAMUSCULAR | Status: AC
  Administered 2023-08-16: 4 mg via INTRAVENOUS
  Filled 2023-08-16: qty 2

## 2023-08-16 MED ORDER — ACETAMINOPHEN 325 MG PO TABS
650.0000 mg | ORAL_TABLET | ORAL | Status: DC | PRN
  Administered 2023-08-16 – 2023-08-19 (×5): 650 mg via ORAL
  Filled 2023-08-16 (×5): qty 2

## 2023-08-16 MED ORDER — LORAZEPAM 2 MG/ML IJ SOLN
0.5000 mg | Freq: Once | INTRAMUSCULAR | Status: AC
  Administered 2023-08-16: 0.5 mg via INTRAVENOUS
  Filled 2023-08-16: qty 1

## 2023-08-16 MED ORDER — POTASSIUM CHLORIDE CRYS ER 20 MEQ PO TBCR
40.0000 meq | EXTENDED_RELEASE_TABLET | Freq: Once | ORAL | Status: AC
  Administered 2023-08-16: 40 meq via ORAL
  Filled 2023-08-16: qty 2

## 2023-08-16 MED ORDER — ONDANSETRON 4 MG PO TBDP
4.0000 mg | ORAL_TABLET | Freq: Once | ORAL | Status: AC
  Administered 2023-08-16: 4 mg via ORAL
  Filled 2023-08-16: qty 1

## 2023-08-16 NOTE — ED Provider Notes (Signed)
[facility] Provider Note    Event Date/Time   First MD Initiated Contact with Patient 08/16/23 [phone]     (approximate)   History   Cough   HPI  Kaylee [name] is a 63 y.o. female who comes in with concerns for a cough for the past month.  On review of records patient has a history of COPD, bipolar, delusional parasitosis.  It appears that patient has long history of delusional parasitosis and patient had declined psychiatric evaluation.  She was started on doxycycline on 1/17.  Patient reportedly came in for a cough but she perseverates on the concern for having parasites and need to have her stool tested.  I tried to explain patient that she was positive for the flu and she continues to go back to the concerns for the parasites and is not able to give much other history.  Patient states that she has been nausea and having vomiting for the past few days not been able to keep any food down.   Physical Exam   Triage Vital Signs: ED Triage Vitals  Encounter Vitals Group     BP 08/15/23 2345 (!) 141/96     Systolic BP Percentile --      Diastolic BP Percentile --      Pulse Rate 08/15/23 2345 (!) 116     Resp 08/15/23 2345 20     Temp 08/15/23 2345 98.6 F (37 C)     Temp Source 08/15/23 2345 Oral     SpO2 08/15/23 2345 99 %     Weight 08/15/23 2344 170 lb (77.1 kg)     Height 08/15/23 2344 5\' 6"  (1.676 m)     Head Circumference --      Peak Flow --      Pain Score --      Pain Loc --      Pain Education --      Exclude from Growth Chart --     Most recent vital signs: Vitals:   08/15/23 2345 08/16/23 0106  BP: (!) 141/96   Pulse: (!) 116 (!) 110  Resp: 20   Temp: 98.6 F (37 C)   SpO2: 99% 100%     General: Awake, no distress.  CV:  Good peripheral perfusion.  Resp:  Normal effort.  Abd:  No distention.  Soft and nontender Other:  Pressured speech, tangential, perseverating on parasites   ED Results / Procedures / Treatments    Labs (all labs ordered are listed, but only abnormal results are displayed) Labs Reviewed  RESP PANEL BY RT-PCR (RSV, FLU A&B, COVID)  [id number] - Abnormal; Notable for the following components:      Result Value   Influenza A by PCR POSITIVE (*)    All other components within normal limits     EKG  My interpretation of EKG:  Sinus tachycardia rate 117 without any ST elevation or T wave versions, normal intervals  RADIOLOGY I have reviewed the xray personally and interpreted and patient has no pneumonia  PROCEDURES:  Critical Care performed: No  Procedures   MEDICATIONS ORDERED IN ED: Medications  ondansetron (ZOFRAN-ODT) disintegrating tablet 4 mg (has no administration in time range)  benzonatate (TESSALON) capsule 100 mg (has no administration in time range)  ondansetron (ZOFRAN) injection 4 mg (4 mg Intravenous Given 08/16/23 0334)  LORazepam (ATIVAN) injection 0.5 mg (0.5 mg Intravenous Given 08/16/23 0334)  lactated ringers bolus 1,000 mL (0 mLs Intravenous Stopped 08/16/23 0539)  IMPRESSION / MDM / ASSESSMENT AND PLAN / ED COURSE  I reviewed the triage vital signs and the nursing notes.   Patient's presentation is most consistent with acute presentation with potential threat to life or bodily function.   Patient comes in with concerns for nausea vomiting unable to tolerate p.o. potentially for the past few days.  Blood work ordered evaluate for [employer] abnormalities, AKI.  Patient has known history of chronic delusions however she has pressured speech and somewhat difficult to evaluate due to her perseveration on the parasites.  I tried to discuss with her her being flu positive and she just denies it is unable to have a conversation about anything other than the parasites.  She denies any SI or HI but given her history of bipolar I will discuss with psychiatry.  Patient be given symptomatic treatment for the flu  Patient is flu positive Tylenol negative CBC normal  troponin negative   Reevaluated patient she still feels nauseous moving arm around in bed.  She is requesting Dilaudid.  Explained to patient that Dilaudid is not indicated at this time.  Her abdomen is soft and nontender.  She does report being out of her psychiatric medications.   Clinical Course as of 08/16/23 0732  Mon Aug 16, 2023  0702 PMH of chronic delusions of parasite infection.  Presents with pressured speech and delusions.  Presents and tested positive for influenza.  Psych consulted for  [SM]    Clinical Course User Index [SM] Corena Herter, MD     FINAL CLINICAL IMPRESSION(S) / ED DIAGNOSES   Final diagnoses:  Influenza  Fear of parasites     Rx / DC Orders   ED Discharge Orders     None        Note:  This document was prepared using Dragon voice recognition software and may include unintentional dictation errors.   Concha Se, MD 08/16/23 [phone]

## 2023-08-16 NOTE — ED Notes (Addendum)
Patient was changed into purple scrubs. 2 belonging bags with personal items.

## 2023-08-16 NOTE — ED Provider Notes (Addendum)
Care assumed of patient from outgoing provider.  See their note for initial history, exam and plan.  Clinical Course as of 08/16/23 1350  Mon Aug 16, 2023  0702 PMH of chronic delusions of parasite infection.  Presents with pressured speech and delusions.  Presents and tested positive for influenza.  Psych consulted for further evaluation given concern for acute mania.  Patient was evaluated by psychiatry who recommended inpatient placement.  Patient febrile this morning secondary to influenza.  Given Tylenol, Tessalon Perles for her cough and Zofran. [SM]    Clinical Course User Index [SM] Corena Herter, MD   Psychiatry recommended inpatient psychiatric hospitalization.  Given that she is influenza positive will need to send out to another facility.   Corena Herter, MD 08/16/23 2951    Corena Herter, MD 08/16/23 1350    Corena Herter, MD 08/16/23 1414

## 2023-08-16 NOTE — Progress Notes (Signed)
Attempted to assess the patient at this time.  Pt is extremely drowsy and unable to stay awake and participate in the interview.  Will attempt later.

## 2023-08-16 NOTE — ED Notes (Signed)
Pt urinated on self she stated "due to my coughing". This tech and Vance Gather NT assisted pt with changing underwear and pants. Also pt wiped peri area with wipes. Bed linen was changed. Pt now in bed at this time.

## 2023-08-16 NOTE — ED Notes (Signed)
 Meds given.
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
## 2023-08-16 NOTE — BH Assessment (Addendum)
Per [facility] patient to be referred out of system.  Referral information for Psychiatric Hospitalization faxed to;   [facility] [phone]- [phone]), no appropriate bed.  [facility] [phone]),   [facility] ([phone]--[phone])  [facility] [phone] -[phone]) [phone]  [facility] [phone]),  [place] [phone], [phone], [phone] or [phone]),   [place] [phone]--- [phone]--- [phone]--- [phone])  [facility] [phone]),   [facility] [phone] -[phone]), patient declined due to having the flu.  [facility] [phone] phone-- [phone])  [facility] [phone]),  [address] [phone])  [facility] [phone] or [phone]),   [facility] [phone]).  [facility] [phone])
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
## 2023-08-16 NOTE — ED Notes (Signed)
 Pt given dinner tray and beverage

## 2023-08-16 NOTE — ED Notes (Signed)
Pt hit call bell, when writer arrived to the room. Pt had stated that the doctor told her that we need to collect her vomit to test it for feces. Rn notified of encounter.

## 2023-08-16 NOTE — ED Notes (Signed)
 Lunch provided.
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
## 2023-08-17 ENCOUNTER — Emergency Department: Payer: 59

## 2023-08-17 ENCOUNTER — Other Ambulatory Visit: Payer: Self-pay

## 2023-08-17 DIAGNOSIS — Z66 Do not resuscitate: Secondary | ICD-10-CM | POA: Diagnosis not present

## 2023-08-17 DIAGNOSIS — R059 Cough, unspecified: Secondary | ICD-10-CM | POA: Diagnosis present

## 2023-08-17 DIAGNOSIS — J939 Pneumothorax, unspecified: Secondary | ICD-10-CM | POA: Diagnosis not present

## 2023-08-17 DIAGNOSIS — J869 Pyothorax without fistula: Secondary | ICD-10-CM | POA: Diagnosis not present

## 2023-08-17 DIAGNOSIS — R739 Hyperglycemia, unspecified: Secondary | ICD-10-CM | POA: Diagnosis not present

## 2023-08-17 DIAGNOSIS — B9562 Methicillin resistant Staphylococcus aureus infection as the cause of diseases classified elsewhere: Secondary | ICD-10-CM | POA: Diagnosis not present

## 2023-08-17 DIAGNOSIS — I959 Hypotension, unspecified: Secondary | ICD-10-CM | POA: Diagnosis not present

## 2023-08-17 DIAGNOSIS — J09X1 Influenza due to identified novel influenza A virus with pneumonia: Secondary | ICD-10-CM | POA: Diagnosis not present

## 2023-08-17 DIAGNOSIS — J9601 Acute respiratory failure with hypoxia: Secondary | ICD-10-CM | POA: Diagnosis present

## 2023-08-17 DIAGNOSIS — N179 Acute kidney failure, unspecified: Secondary | ICD-10-CM | POA: Diagnosis not present

## 2023-08-17 DIAGNOSIS — I4892 Unspecified atrial flutter: Secondary | ICD-10-CM | POA: Diagnosis present

## 2023-08-17 DIAGNOSIS — D649 Anemia, unspecified: Secondary | ICD-10-CM | POA: Diagnosis not present

## 2023-08-17 DIAGNOSIS — A4102 Sepsis due to Methicillin resistant Staphylococcus aureus: Secondary | ICD-10-CM | POA: Diagnosis present

## 2023-08-17 DIAGNOSIS — E1165 Type 2 diabetes mellitus with hyperglycemia: Secondary | ICD-10-CM | POA: Diagnosis present

## 2023-08-17 DIAGNOSIS — I480 Paroxysmal atrial fibrillation: Secondary | ICD-10-CM | POA: Diagnosis not present

## 2023-08-17 DIAGNOSIS — J1001 Influenza due to other identified influenza virus with the same other identified influenza virus pneumonia: Secondary | ICD-10-CM | POA: Diagnosis present

## 2023-08-17 DIAGNOSIS — I9589 Other hypotension: Secondary | ICD-10-CM | POA: Diagnosis not present

## 2023-08-17 DIAGNOSIS — J09X2 Influenza due to identified novel influenza A virus with other respiratory manifestations: Secondary | ICD-10-CM | POA: Diagnosis not present

## 2023-08-17 DIAGNOSIS — G9341 Metabolic encephalopathy: Secondary | ICD-10-CM | POA: Diagnosis not present

## 2023-08-17 DIAGNOSIS — J850 Gangrene and necrosis of lung: Secondary | ICD-10-CM | POA: Diagnosis present

## 2023-08-17 DIAGNOSIS — I10 Essential (primary) hypertension: Secondary | ICD-10-CM | POA: Diagnosis present

## 2023-08-17 DIAGNOSIS — J15212 Pneumonia due to Methicillin resistant Staphylococcus aureus: Secondary | ICD-10-CM | POA: Diagnosis present

## 2023-08-17 DIAGNOSIS — R9431 Abnormal electrocardiogram [ECG] [EKG]: Secondary | ICD-10-CM | POA: Diagnosis not present

## 2023-08-17 DIAGNOSIS — J129 Viral pneumonia, unspecified: Secondary | ICD-10-CM | POA: Diagnosis present

## 2023-08-17 DIAGNOSIS — J918 Pleural effusion in other conditions classified elsewhere: Secondary | ICD-10-CM | POA: Diagnosis not present

## 2023-08-17 DIAGNOSIS — R042 Hemoptysis: Secondary | ICD-10-CM | POA: Diagnosis not present

## 2023-08-17 DIAGNOSIS — I4891 Unspecified atrial fibrillation: Secondary | ICD-10-CM | POA: Diagnosis not present

## 2023-08-17 DIAGNOSIS — A419 Sepsis, unspecified organism: Secondary | ICD-10-CM | POA: Diagnosis not present

## 2023-08-17 DIAGNOSIS — R6521 Severe sepsis with septic shock: Secondary | ICD-10-CM | POA: Diagnosis present

## 2023-08-17 DIAGNOSIS — E871 Hypo-osmolality and hyponatremia: Secondary | ICD-10-CM | POA: Diagnosis present

## 2023-08-17 DIAGNOSIS — J440 Chronic obstructive pulmonary disease with acute lower respiratory infection: Secondary | ICD-10-CM | POA: Diagnosis present

## 2023-08-17 DIAGNOSIS — J111 Influenza due to unidentified influenza virus with other respiratory manifestations: Secondary | ICD-10-CM | POA: Diagnosis not present

## 2023-08-17 DIAGNOSIS — J449 Chronic obstructive pulmonary disease, unspecified: Secondary | ICD-10-CM | POA: Diagnosis not present

## 2023-08-17 DIAGNOSIS — J1008 Influenza due to other identified influenza virus with other specified pneumonia: Secondary | ICD-10-CM | POA: Diagnosis present

## 2023-08-17 DIAGNOSIS — R0603 Acute respiratory distress: Secondary | ICD-10-CM | POA: Diagnosis not present

## 2023-08-17 DIAGNOSIS — A6009 Herpesviral infection of other urogenital tract: Secondary | ICD-10-CM | POA: Diagnosis not present

## 2023-08-17 DIAGNOSIS — F319 Bipolar disorder, unspecified: Secondary | ICD-10-CM | POA: Diagnosis not present

## 2023-08-17 DIAGNOSIS — R509 Fever, unspecified: Secondary | ICD-10-CM | POA: Diagnosis not present

## 2023-08-17 DIAGNOSIS — Z9911 Dependence on respirator [ventilator] status: Secondary | ICD-10-CM | POA: Diagnosis not present

## 2023-08-17 DIAGNOSIS — J158 Pneumonia due to other specified bacteria: Secondary | ICD-10-CM | POA: Diagnosis not present

## 2023-08-17 DIAGNOSIS — B965 Pseudomonas (aeruginosa) (mallei) (pseudomallei) as the cause of diseases classified elsewhere: Secondary | ICD-10-CM | POA: Diagnosis not present

## 2023-08-17 DIAGNOSIS — F15959 Other stimulant use, unspecified with stimulant-induced psychotic disorder, unspecified: Secondary | ICD-10-CM | POA: Diagnosis present

## 2023-08-17 DIAGNOSIS — J9602 Acute respiratory failure with hypercapnia: Secondary | ICD-10-CM | POA: Diagnosis present

## 2023-08-17 DIAGNOSIS — J90 Pleural effusion, not elsewhere classified: Secondary | ICD-10-CM | POA: Diagnosis not present

## 2023-08-17 DIAGNOSIS — F22 Delusional disorders: Secondary | ICD-10-CM | POA: Diagnosis not present

## 2023-08-17 DIAGNOSIS — B3731 Acute candidiasis of vulva and vagina: Secondary | ICD-10-CM | POA: Diagnosis not present

## 2023-08-17 DIAGNOSIS — E876 Hypokalemia: Secondary | ICD-10-CM | POA: Diagnosis not present

## 2023-08-17 DIAGNOSIS — F19959 Other psychoactive substance use, unspecified with psychoactive substance-induced psychotic disorder, unspecified: Secondary | ICD-10-CM | POA: Diagnosis not present

## 2023-08-17 DIAGNOSIS — M0600 Rheumatoid arthritis without rheumatoid factor, unspecified site: Secondary | ICD-10-CM | POA: Diagnosis present

## 2023-08-17 DIAGNOSIS — F3110 Bipolar disorder, current episode manic without psychotic features, unspecified: Secondary | ICD-10-CM | POA: Diagnosis present

## 2023-08-17 DIAGNOSIS — F13239 Sedative, hypnotic or anxiolytic dependence with withdrawal, unspecified: Secondary | ICD-10-CM | POA: Diagnosis present

## 2023-08-17 DIAGNOSIS — A6000 Herpesviral infection of urogenital system, unspecified: Secondary | ICD-10-CM | POA: Diagnosis not present

## 2023-08-17 DIAGNOSIS — I351 Nonrheumatic aortic (valve) insufficiency: Secondary | ICD-10-CM | POA: Diagnosis not present

## 2023-08-17 DIAGNOSIS — J441 Chronic obstructive pulmonary disease with (acute) exacerbation: Secondary | ICD-10-CM | POA: Diagnosis present

## 2023-08-17 DIAGNOSIS — J189 Pneumonia, unspecified organism: Secondary | ICD-10-CM | POA: Diagnosis not present

## 2023-08-17 LAB — CBC WITH DIFFERENTIAL/PLATELET
Abs Immature Granulocytes: 0.05 10*3/uL (ref 0.00–0.07)
Basophils Absolute: 0 10*3/uL (ref 0.0–0.1)
Basophils Relative: 1 %
Eosinophils Absolute: 0 10*3/uL (ref 0.0–0.5)
Eosinophils Relative: 0 %
HCT: 37.9 % (ref 36.0–46.0)
Hemoglobin: 13.4 g/dL (ref 12.0–15.0)
Immature Granulocytes: 2 %
Lymphocytes Relative: 29 %
Lymphs Abs: 0.6 10*3/uL — ABNORMAL LOW (ref 0.7–4.0)
MCH: 31 pg (ref 26.0–34.0)
MCHC: 35.4 g/dL (ref 30.0–36.0)
MCV: 87.7 fL (ref 80.0–100.0)
Monocytes Absolute: 0.3 10*3/uL (ref 0.1–1.0)
Monocytes Relative: 13 %
Neutro Abs: 1.2 10*3/uL — ABNORMAL LOW (ref 1.7–7.7)
Neutrophils Relative %: 55 %
Platelets: 191 10*3/uL (ref 150–400)
RBC: 4.32 MIL/uL (ref 3.87–5.11)
RDW: 11.9 % (ref 11.5–15.5)
Smear Review: NORMAL
WBC: 2.1 10*3/uL — ABNORMAL LOW (ref 4.0–10.5)
nRBC: 0 % (ref 0.0–0.2)

## 2023-08-17 LAB — COMPREHENSIVE METABOLIC PANEL
ALT: 32 U/L (ref 0–44)
AST: 98 U/L — ABNORMAL HIGH (ref 15–41)
Albumin: 3 g/dL — ABNORMAL LOW (ref 3.5–5.0)
Alkaline Phosphatase: 37 U/L — ABNORMAL LOW (ref 38–126)
Anion gap: 13 (ref 5–15)
BUN: 19 mg/dL (ref 8–23)
CO2: 18 mmol/L — ABNORMAL LOW (ref 22–32)
Calcium: 7.6 mg/dL — ABNORMAL LOW (ref 8.9–10.3)
Chloride: 94 mmol/L — ABNORMAL LOW (ref 98–111)
Creatinine, Ser: 1.11 mg/dL — ABNORMAL HIGH (ref 0.44–1.00)
GFR, Estimated: 56 mL/min — ABNORMAL LOW (ref >60)
Glucose, Bld: 100 mg/dL — ABNORMAL HIGH (ref 70–99)
Potassium: 3 mmol/L — ABNORMAL LOW (ref 3.5–5.1)
Sodium: 125 mmol/L — ABNORMAL LOW (ref 135–145)
Total Bilirubin: 1 mg/dL (ref 0.0–1.2)
Total Protein: 6.1 g/dL — ABNORMAL LOW (ref 6.5–8.1)

## 2023-08-17 LAB — LACTIC ACID, PLASMA: Lactic Acid, Venous: 1.9 mmol/L (ref 0.5–1.9)

## 2023-08-17 LAB — PROCALCITONIN: Procalcitonin: 49.2 ng/mL

## 2023-08-17 LAB — HIV ANTIBODY (ROUTINE TESTING W REFLEX): HIV Screen 4th Generation wRfx: NONREACTIVE

## 2023-08-17 MED ORDER — ALLOPURINOL 100 MG PO TABS
100.0000 mg | ORAL_TABLET | Freq: Every day | ORAL | Status: DC
  Administered 2023-08-17 – 2023-08-18 (×2): 100 mg via ORAL
  Filled 2023-08-17 (×3): qty 1

## 2023-08-17 MED ORDER — KETOROLAC TROMETHAMINE 15 MG/ML IJ SOLN
15.0000 mg | Freq: Once | INTRAMUSCULAR | Status: AC
  Administered 2023-08-17: 15 mg via INTRAVENOUS
  Filled 2023-08-17: qty 1

## 2023-08-17 MED ORDER — ACETAMINOPHEN 325 MG PO TABS
325.0000 mg | ORAL_TABLET | Freq: Once | ORAL | Status: AC
  Administered 2023-08-17: 325 mg via ORAL

## 2023-08-17 MED ORDER — METHYLPREDNISOLONE SODIUM SUCC 125 MG IJ SOLR
125.0000 mg | Freq: Once | INTRAMUSCULAR | Status: AC
  Administered 2023-08-17: 125 mg via INTRAVENOUS
  Filled 2023-08-17: qty 2

## 2023-08-17 MED ORDER — ENOXAPARIN SODIUM 40 MG/0.4ML IJ SOSY
40.0000 mg | PREFILLED_SYRINGE | INTRAMUSCULAR | Status: DC
  Administered 2023-08-17 – 2023-08-18 (×2): 40 mg via SUBCUTANEOUS
  Filled 2023-08-17 (×2): qty 0.4

## 2023-08-17 MED ORDER — AZITHROMYCIN 500 MG IV SOLR
500.0000 mg | INTRAVENOUS | Status: AC
  Administered 2023-08-18 – 2023-08-21 (×4): 500 mg via INTRAVENOUS
  Filled 2023-08-17 (×5): qty 5

## 2023-08-17 MED ORDER — SODIUM CHLORIDE 0.9 % IV SOLN
2.0000 g | INTRAVENOUS | Status: DC
  Administered 2023-08-18: 2 g via INTRAVENOUS
  Filled 2023-08-17 (×2): qty 20

## 2023-08-17 MED ORDER — PANTOPRAZOLE SODIUM 40 MG PO TBEC
40.0000 mg | DELAYED_RELEASE_TABLET | Freq: Every day | ORAL | Status: DC
Start: 2023-08-17 — End: 2023-08-19
  Administered 2023-08-17 – 2023-08-18 (×2): 40 mg via ORAL
  Filled 2023-08-17 (×2): qty 1

## 2023-08-17 MED ORDER — LACTATED RINGERS IV SOLN: INTRAVENOUS | Status: DC

## 2023-08-17 MED ORDER — ACETAMINOPHEN 500 MG PO TABS: 1000.0000 mg | ORAL_TABLET | Freq: Once | ORAL | Status: DC

## 2023-08-17 MED ORDER — SODIUM CHLORIDE 3 % IN NEBU
4.0000 mL | INHALATION_SOLUTION | Freq: Two times a day (BID) | RESPIRATORY_TRACT | Status: AC
  Administered 2023-08-17 – 2023-08-19 (×5): 4 mL via RESPIRATORY_TRACT
  Filled 2023-08-17 (×6): qty 4

## 2023-08-17 MED ORDER — SODIUM CHLORIDE 0.9 % IV SOLN
2.0000 g | Freq: Once | INTRAVENOUS | Status: AC
Start: 2023-08-17 — End: 2023-08-17
  Administered 2023-08-17: 2 g via INTRAVENOUS
  Filled 2023-08-17: qty 20

## 2023-08-17 MED ORDER — FUROSEMIDE 10 MG/ML IJ SOLN
40.0000 mg | Freq: Once | INTRAMUSCULAR | Status: AC
  Administered 2023-08-17: 40 mg via INTRAVENOUS
  Filled 2023-08-17: qty 4

## 2023-08-17 MED ORDER — MORPHINE SULFATE (PF) 2 MG/ML IV SOLN: 1.0000 mg | INTRAVENOUS | Status: DC | PRN

## 2023-08-17 MED ORDER — IPRATROPIUM-ALBUTEROL 0.5-2.5 (3) MG/3ML IN SOLN
3.0000 mL | Freq: Once | RESPIRATORY_TRACT | Status: AC
  Administered 2023-08-17: 3 mL via RESPIRATORY_TRACT
  Filled 2023-08-17: qty 3

## 2023-08-17 MED ORDER — DIAZEPAM 5 MG PO TABS
5.0000 mg | ORAL_TABLET | Freq: Two times a day (BID) | ORAL | Status: DC
Start: 2023-08-17 — End: 2023-08-19
  Administered 2023-08-17 – 2023-08-18 (×3): 5 mg via ORAL
  Filled 2023-08-17 (×3): qty 1

## 2023-08-17 MED ORDER — PREDNISONE 20 MG PO TABS
40.0000 mg | ORAL_TABLET | Freq: Every day | ORAL | Status: DC
  Administered 2023-08-18: 40 mg via ORAL
  Filled 2023-08-17: qty 2

## 2023-08-17 MED ORDER — IPRATROPIUM-ALBUTEROL 0.5-2.5 (3) MG/3ML IN SOLN
3.0000 mL | Freq: Three times a day (TID) | RESPIRATORY_TRACT | Status: DC
  Administered 2023-08-17 (×2): 3 mL via RESPIRATORY_TRACT
  Filled 2023-08-17 (×2): qty 3

## 2023-08-17 MED ORDER — ACETAMINOPHEN 325 MG PO TABS
ORAL_TABLET | ORAL | Status: AC
  Filled 2023-08-17: qty 1

## 2023-08-17 MED ORDER — ASPIRIN 81 MG PO TBEC
81.0000 mg | DELAYED_RELEASE_TABLET | Freq: Every day | ORAL | Status: DC
  Administered 2023-08-17 – 2023-08-18 (×2): 81 mg via ORAL
  Filled 2023-08-17 (×2): qty 1

## 2023-08-17 MED ORDER — DIAZEPAM 5 MG PO TABS: 5.0000 mg | ORAL_TABLET | Freq: Two times a day (BID) | ORAL | Status: DC

## 2023-08-17 MED ORDER — LORAZEPAM 2 MG/ML IJ SOLN
2.0000 mg | Freq: Once | INTRAMUSCULAR | Status: AC
  Administered 2023-08-17: 2 mg via INTRAVENOUS
  Filled 2023-08-17: qty 1

## 2023-08-17 MED ORDER — LACTATED RINGERS IV BOLUS
1000.0000 mL | Freq: Once | INTRAVENOUS | Status: AC
Start: 2023-08-17 — End: 2023-08-17
  Administered 2023-08-17: 1000 mL via INTRAVENOUS

## 2023-08-17 MED ORDER — POTASSIUM CHLORIDE CRYS ER 20 MEQ PO TBCR
40.0000 meq | EXTENDED_RELEASE_TABLET | ORAL | Status: AC
  Administered 2023-08-17 (×2): 40 meq via ORAL
  Filled 2023-08-17 (×2): qty 2

## 2023-08-17 MED ORDER — MORPHINE SULFATE (PF) 2 MG/ML IV SOLN
1.0000 mg | INTRAVENOUS | Status: DC | PRN
  Administered 2023-08-17 – 2023-08-18 (×5): 1 mg via INTRAVENOUS
  Filled 2023-08-17 (×5): qty 1

## 2023-08-17 MED ORDER — IPRATROPIUM-ALBUTEROL 0.5-2.5 (3) MG/3ML IN SOLN
3.0000 mL | Freq: Four times a day (QID) | RESPIRATORY_TRACT | Status: DC
  Administered 2023-08-17 – 2023-08-22 (×19): 3 mL via RESPIRATORY_TRACT
  Filled 2023-08-17 (×19): qty 3

## 2023-08-17 MED ORDER — SODIUM CHLORIDE 0.9 % IV SOLN
500.0000 mg | Freq: Once | INTRAVENOUS | Status: AC
  Administered 2023-08-17: 500 mg via INTRAVENOUS
  Filled 2023-08-17: qty 5

## 2023-08-17 MED ORDER — ARIPIPRAZOLE 5 MG PO TABS
5.0000 mg | ORAL_TABLET | Freq: Every day | ORAL | Status: DC
  Administered 2023-08-17 – 2023-08-18 (×2): 5 mg via ORAL
  Filled 2023-08-17 (×3): qty 1

## 2023-08-17 NOTE — ED Notes (Signed)
Cleaned patient up after use of bedpan. Call bell in reach. Bed alarm on.

## 2023-08-17 NOTE — Progress Notes (Addendum)
 After chart review and review of nursing notes, patient had an incident of sepsis overnight in which prompted for medical to get involved again with patient management due to influenza and pneumonia.  On face-to-face assessment patient is very drowsy, with nursing stating that she had an incontinent episode last night and has been weak ever since.  Patient will remain with medical for medical admission, at this time while attempting to interview the patient is unable to participate in psychiatry will follow-up tomorrow after medical has admitted the patient.  No new medication changes, will continue plan from consult note on 08/16/2023.

## 2023-08-17 NOTE — ED Notes (Signed)
 MD at bedside at this time. Patients respirations labored and breathing at 31 bpm at this time. Patient visibly diaphoretic on this RN's assessment. Patient able to answer all orientation questions correctly. Patient reported to this RN she is uncomfortable and short of breath.

## 2023-08-17 NOTE — Progress Notes (Signed)
 At this time Dr. Jetty ED attending for this patient has reached out to me asking about potential benzo withdrawal due to known dependence.  Patient was recently prescribed and changed to Valium  5 mg twice a day as needed for anxiety and has been known to have taken Xanax  for many years.  Due to patient's medical problems currently with the flu and pneumonia, providers concern for potential withdrawal symptoms including tachycardia and being diaphoretic.  Due to collateral information supporting this concern, for potential benzo benzo withdrawal patient was ordered 5 mg Valium  twice a day until patient can be medically cleared for psych eval for medication management.  Patient will be followed up the next day and monitored for Valium  while receiving medical care for sepsis treatment related to pneumonia and flu.

## 2023-08-17 NOTE — ED Notes (Signed)
Pt given water upon request

## 2023-08-17 NOTE — Telephone Encounter (Signed)
Noted. New psychiatry referral placed.  Pt currently hospitalized with influenza pneumonia and concern for acute mania.

## 2023-08-17 NOTE — ED Notes (Signed)
Messaged providers regarding patient's continued tachycardia, tachypnea, and diaphoresis.

## 2023-08-17 NOTE — Progress Notes (Signed)
 CODE SEPSIS - PHARMACY COMMUNICATION  **Broad Spectrum Antibiotics should be administered within 1 hour of Sepsis diagnosis**  Time Code Sepsis Called/Page Received: 0607 (Influenza)  Antivirals Ordered: Tamiflu   Time of 1st antiviral administration: 0542  Additional action taken by pharmacy: [facility] ED provider, confirming no abx warranted at this time.  Rankin CANDIE Dills, PharmD, [facility] 08/17/2023 6:33 AM

## 2023-08-17 NOTE — Telephone Encounter (Signed)
See subsequent phone note.

## 2023-08-17 NOTE — TOC CM/SW Note (Signed)
CSW attempted to complete High Risk Re-admission assessment; however, pt unavailable at time of attempt.

## 2023-08-17 NOTE — ED Provider Notes (Addendum)
 Emergency Medicine Observation Re-evaluation Note  Kaylee [name] is a 63 y.o. female, seen on rounds today.  Pt initially presented to the ED for complaints of Cough  Currently, the patient is is no acute distress. Denies any concerns at this time.  Physical Exam  Blood pressure (!) 141/76, pulse (!) 123, temperature 100.3 F (37.9 C), temperature source Oral, resp. rate 17, height 5' 6 (1.676 m), weight 77.1 kg, SpO2 90%.  Physical Exam: General: No apparent distress Pulm: Normal WOB Neuro: Moving all extremities Psych: Resting comfortably     ED Course / MDM   Clinical Course as of 08/17/23 0714  Mon Aug 16, 2023  0702 PMH of chronic delusions of parasite infection.  Presents with pressured speech and delusions.  Presents and tested positive for influenza.  Psych consulted for further evaluation given concern for acute mania.  Patient was evaluated by psychiatry who recommended inpatient placement.  Patient febrile this morning secondary to influenza.  Given Tylenol , Tessalon  Perles for her cough and Zofran . [SM]    Clinical Course User Index [SM] Suzanne Kirsch, MD    I have reviewed the labs performed to date as well as medications administered while in observation.  Recent changes in the last 24 hours include: No acute events overnight.  Plan   Current plan: Patient awaiting psychiatric disposition. Patient is not under full IVC at this time.    Kaylee [name], Kaylee SAILOR, DO 08/17/23 0416    6:03 AM  Informed by nursing staff that patient is now febrile, tachycardic, tachypneic and hypoxic.  She is positive for influenza A.  Will repeat labs, chest x-ray.  Will give IV fluids, Tamiflu , Tylenol .  Will give breathing treatment as well.  She has history of COPD and does not wear oxygen  chronically.  Patient will need medical admission.  7:12 AM Pt's white count is 2.1 suggestive by a viral illness.  Lactic normal.  Chest x-ray reviewed and interpreted by myself and the  radiologist and shows bilateral pneumonia.  Likely viral in nature but will cover with Rocephin , azithromycin .  Concern for possible aspiration but no history of aspiration here or vomiting.  Will discuss with hospitalist for medical admission.   Consulted and discussed patient's case with hospitalist, Dr. Awanda.  I have recommended admission and consulting physician agrees and will place admission orders.  Patient (and family if present) agree with this plan.   I reviewed all nursing notes, vitals, pertinent previous records.  All labs, EKGs, imaging ordered have been independently reviewed and interpreted by myself.   CRITICAL CARE Performed by: Kaylee Sink   Total critical care time: 35 minutes  Critical care time was exclusive of separately billable procedures and treating other patients.  Critical care was necessary to treat or prevent imminent or life-threatening deterioration.  Critical care was time spent personally by me on the following activities: development of treatment plan with patient and/or surrogate as well as nursing, discussions with consultants, evaluation of patient's response to treatment, examination of patient, obtaining history from patient or surrogate, ordering and performing treatments and interventions, ordering and review of laboratory studies, ordering and review of radiographic studies, pulse oximetry and re-evaluation of patient's condition.    Date: 08/17/2023 6:29 AM  Rate: 117  Rhythm: Sinus tachycardia  QRS Axis: normal  Intervals: normal  ST/T Wave abnormalities: normal  Conduction Disutrbances: none  Narrative Interpretation: Sinus tachycardia      Kaylee [name], Kaylee SAILOR, DO 08/17/23 [phone]

## 2023-08-17 NOTE — ED Notes (Addendum)
Pt found to be incontinent of stool. RN assisted pt with complete bed change and pericare. Placed on 4L [place] due to oxygen saturation 85% on RA. EDP K. Ward notified of most recent VS.

## 2023-08-17 NOTE — ED Notes (Signed)
Messaged provider's regarding continued tachycardia, tachypnea, and diaphoresis.

## 2023-08-17 NOTE — ED Notes (Signed)
Patient placed on high-flow nasal cannula at this time- currently 97% on high flow

## 2023-08-17 NOTE — Sepsis Progress Note (Addendum)
Elink following for sepsis protocol, per current MD, + flu, no abx's ordered, tamiflu ordered, will cont as sepsis protocol
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
## 2023-08-18 ENCOUNTER — Encounter: Payer: Self-pay | Admitting: Hospitalist

## 2023-08-18 DIAGNOSIS — F319 Bipolar disorder, unspecified: Secondary | ICD-10-CM | POA: Diagnosis not present

## 2023-08-18 DIAGNOSIS — J189 Pneumonia, unspecified organism: Secondary | ICD-10-CM | POA: Diagnosis not present

## 2023-08-18 DIAGNOSIS — J111 Influenza due to unidentified influenza virus with other respiratory manifestations: Principal | ICD-10-CM

## 2023-08-18 DIAGNOSIS — J9601 Acute respiratory failure with hypoxia: Secondary | ICD-10-CM | POA: Diagnosis not present

## 2023-08-18 DIAGNOSIS — E871 Hypo-osmolality and hyponatremia: Secondary | ICD-10-CM

## 2023-08-18 DIAGNOSIS — I480 Paroxysmal atrial fibrillation: Secondary | ICD-10-CM | POA: Diagnosis not present

## 2023-08-18 DIAGNOSIS — I4891 Unspecified atrial fibrillation: Secondary | ICD-10-CM

## 2023-08-18 DIAGNOSIS — F40218 Other animal type phobia: Secondary | ICD-10-CM

## 2023-08-18 LAB — BASIC METABOLIC PANEL
Anion gap: 10 (ref 5–15)
BUN: 26 mg/dL — ABNORMAL HIGH (ref 8–23)
CO2: 22 mmol/L (ref 22–32)
Calcium: 8.3 mg/dL — ABNORMAL LOW (ref 8.9–10.3)
Chloride: 97 mmol/L — ABNORMAL LOW (ref 98–111)
Creatinine, Ser: 1.11 mg/dL — ABNORMAL HIGH (ref 0.44–1.00)
GFR, Estimated: 56 mL/min — ABNORMAL LOW (ref >60)
Glucose, Bld: 119 mg/dL — ABNORMAL HIGH (ref 70–99)
Potassium: 4.2 mmol/L (ref 3.5–5.1)
Sodium: 129 mmol/L — ABNORMAL LOW (ref 135–145)

## 2023-08-18 LAB — CBC
HCT: 38.7 % (ref 36.0–46.0)
Hemoglobin: 13.4 g/dL (ref 12.0–15.0)
MCH: 30.5 pg (ref 26.0–34.0)
MCHC: 34.6 g/dL (ref 30.0–36.0)
MCV: 88 fL (ref 80.0–100.0)
Platelets: 147 10*3/uL — ABNORMAL LOW (ref 150–400)
RBC: 4.4 MIL/uL (ref 3.87–5.11)
RDW: 11.9 % (ref 11.5–15.5)
WBC: 3.8 10*3/uL — ABNORMAL LOW (ref 4.0–10.5)
nRBC: 0 % (ref 0.0–0.2)

## 2023-08-18 LAB — MAGNESIUM: Magnesium: 1.8 mg/dL (ref 1.7–2.4)

## 2023-08-18 LAB — PHOSPHORUS: Phosphorus: 3.4 mg/dL (ref 2.5–4.6)

## 2023-08-18 MED ORDER — DIAZEPAM 5 MG PO TABS
5.0000 mg | ORAL_TABLET | Freq: Once | ORAL | Status: AC
  Administered 2023-08-18: 5 mg via ORAL
  Filled 2023-08-18: qty 1

## 2023-08-18 MED ORDER — METOPROLOL TARTRATE 5 MG/5ML IV SOLN
2.5000 mg | Freq: Once | INTRAVENOUS | Status: AC
  Administered 2023-08-18: 2.5 mg via INTRAVENOUS

## 2023-08-18 MED ORDER — METOPROLOL TARTRATE 25 MG PO TABS
25.0000 mg | ORAL_TABLET | Freq: Four times a day (QID) | ORAL | Status: DC
  Administered 2023-08-18 – 2023-08-19 (×3): 25 mg via ORAL
  Filled 2023-08-18 (×3): qty 1

## 2023-08-18 MED ORDER — ALBUTEROL SULFATE (2.5 MG/3ML) 0.083% IN NEBU
2.5000 mg | INHALATION_SOLUTION | RESPIRATORY_TRACT | Status: DC | PRN
  Administered 2023-08-18: 2.5 mg via RESPIRATORY_TRACT
  Filled 2023-08-18: qty 3

## 2023-08-18 MED ORDER — AMIODARONE HCL IN DEXTROSE 360-4.14 MG/200ML-% IV SOLN
30.0000 mg/h | INTRAVENOUS | Status: DC
  Administered 2023-08-18: 30 mg/h via INTRAVENOUS
  Administered 2023-08-19: 60 mg/h via INTRAVENOUS
  Administered 2023-08-19 (×2): 30 mg/h via INTRAVENOUS
  Administered 2023-08-20 (×2): 60 mg/h via INTRAVENOUS
  Administered 2023-08-20 – 2023-08-23 (×7): 30 mg/h via INTRAVENOUS
  Filled 2023-08-18 (×13): qty 200

## 2023-08-18 MED ORDER — DILTIAZEM HCL-DEXTROSE 125-5 MG/125ML-% IV SOLN (PREMIX)
5.0000 mg/h | INTRAVENOUS | Status: DC
  Administered 2023-08-18: 5 mg/h via INTRAVENOUS
  Administered 2023-08-19: 15 mg/h via INTRAVENOUS
  Filled 2023-08-18 (×2): qty 125

## 2023-08-18 MED ORDER — AMIODARONE HCL IN DEXTROSE 360-4.14 MG/200ML-% IV SOLN
60.0000 mg/h | INTRAVENOUS | Status: DC
  Administered 2023-08-18 (×2): 60 mg/h via INTRAVENOUS
  Filled 2023-08-18 (×2): qty 200

## 2023-08-18 MED ORDER — DILTIAZEM LOAD VIA INFUSION
10.0000 mg | Freq: Once | INTRAVENOUS | Status: AC
  Administered 2023-08-18: 10 mg via INTRAVENOUS
  Filled 2023-08-18: qty 10

## 2023-08-18 MED ORDER — OXYCODONE HCL 5 MG PO TABS
5.0000 mg | ORAL_TABLET | Freq: Once | ORAL | Status: AC
  Administered 2023-08-18: 5 mg via ORAL
  Filled 2023-08-18: qty 1

## 2023-08-18 MED ORDER — METHYLPREDNISOLONE SODIUM SUCC 125 MG IJ SOLR
80.0000 mg | Freq: Two times a day (BID) | INTRAMUSCULAR | Status: DC
  Administered 2023-08-18 – 2023-08-19 (×2): 80 mg via INTRAVENOUS
  Filled 2023-08-18 (×2): qty 2

## 2023-08-18 MED ORDER — AMIODARONE LOAD VIA INFUSION
150.0000 mg | Freq: Once | INTRAVENOUS | Status: AC
  Administered 2023-08-18: 150 mg via INTRAVENOUS
  Filled 2023-08-18: qty 83.34

## 2023-08-18 MED ORDER — METOPROLOL TARTRATE 5 MG/5ML IV SOLN
2.5000 mg | Freq: Once | INTRAVENOUS | Status: DC
Start: 2023-08-18 — End: 2023-08-18

## 2023-08-18 MED ORDER — GUAIFENESIN ER 600 MG PO TB12
600.0000 mg | ORAL_TABLET | Freq: Two times a day (BID) | ORAL | Status: DC
  Administered 2023-08-18 – 2023-08-19 (×3): 600 mg via ORAL
  Filled 2023-08-18 (×3): qty 1

## 2023-08-18 MED ORDER — FUROSEMIDE 10 MG/ML IJ SOLN
40.0000 mg | Freq: Once | INTRAMUSCULAR | Status: AC
Start: 2023-08-18 — End: 2023-08-18
  Administered 2023-08-18: 40 mg via INTRAVENOUS
  Filled 2023-08-18: qty 4

## 2023-08-18 NOTE — Progress Notes (Signed)
 PROGRESS NOTE    Kaylee [name]  FMW:[id number] DOB: 05-09-1961 DOA: 08/16/2023 PCP: Rilla Baller, MD  Chief Complaint  Patient presents with   Cough    Hospital Course:  Kaylee [name] is 63 y.o. female with bipolar type I, Ekbom's delusional parasitosis, anxiety disorder, COPD, chronic pain syndrome, who presents complaining of cough and  bugs all over her in the ED she was found to be tachycardic, febrile to 102.8, and tested positive for flu A.  Initially psychiatry was consulted and recommended inpatient psych admission for substance induced psychosis however given her worsening clinical picture she was instead admitted for medical management.  Early in the morning on 2/4 she developed hypoxia with O2 sats of 85% on room air and was placed on 4 L O2.  Chest x-ray revealed bilateral lung opacity and she was started on ceftriaxone  and azithromycin  as well as IV Solu-Medrol .  Subjective: On initial evaluation this morning patient had crackles in lower lung bases and was working to breathe.  I administered an additional dose of IV Lasix .  She is also complaining of generalized pain all over.  1340 rapid response was called due to A-fib RVR.  Upon arrival to the room patient's breathing is similar to previous evaluation.  She still demonstrates some tracheal tugging and increased work of breathing with audible wheezing without stethoscope.  On telemetry heart rate is in the 180s, A-fib.  Confirmed on EKG.  Patient is denying any chest pain but continues to endorse pain all over.  I ordered 2.5 mg IV push of metoprolol  which improved heart rate to roughly 140s, an additional 2.5 mg IV push brought the patient into 120-130s.  Given her ongoing work of breathing we applied BiPAP.  Breathing did improve some.  Patient received last breathing treatment 2 hours prior to rapid.  She is due for another soon.  Although she could benefit from continuous nebulizer therapy, this will further elevate  her heart rate which she is unlikely to tolerate given this current rate.  In review of recent labs patient's potassium is above 4, magnesium  and phosphorus are also within normal limits.  Could benefit from small dose magnesium . I consulted directly with cardiology, and I appreciate their recommendations. I was then alerted again at 1450 that patient's heart rate has gone back to 170.  I have ordered for amiodarone  bolus followed by infusion.  Appreciate recommendations from cardiology.  At this time her blood pressure is holding a MAP above 80 and she may be able to tolerate diltiazem  if they believe that is appropriate.    Objective: Vitals:   08/18/23 0152 08/18/23 0207 08/18/23 0450 08/18/23 0806  BP: (!) 161/87  123/88   Pulse: (!) 127  (!) 119   Resp:   (!) 22   Temp: 99 F (37.2 C)  99.5 F (37.5 C)   TempSrc: Oral  Oral   SpO2: 96%  95% 95%  Weight:  82.1 kg    Height:  5' 6 (1.676 m)      Intake/Output Summary (Last 24 hours) at 08/18/2023 0933 Last data filed at 08/17/2023 1909 Gross per 24 hour  Intake 1050 ml  Output --  Net 1050 ml   Filed Weights   08/15/23 2344 08/18/23 0207  Weight: 77.1 kg 82.1 kg    Examination: General exam: In some distress, work of breathing. Respiratory system: Significant work of breathing, tracheal tugging, expiratory wheeze, some crackles at lower lung bases. Cardiovascular system: S1 & S2  heard, rapid rate, irregular rhythm Gastrointestinal system: Abdomen is nondistended, soft and nontender.  Neuro: Alert and oriented. No focal neurological deficits. Extremities: Symmetric, expected ROM Skin: No rashes, lesions Psychiatry:  Mood & affect appropriate for situation.   Assessment & Plan:  Principal Problem:   Bipolar 1 disorder (HCC) Active Problems:   Ekbom's delusional parasitosis (HCC)   Psychoactive substance-induced psychosis (HCC)   Acute hypoxemic respiratory failure (HCC)    Sepsis Criteria met with fever,  tachycardia, tachypnea, source: Flu and superimposed bacterial pneumonia - Antibiotics as below  Acute hypoxemic respiratory failure - Desatted to 85% on room air 2/4 - Secondary to community-acquired pneumonia and influenza likely complicated by COPD exacerbation - Noted to have excessive mucus in upper airways - Continue with supplemental O2, wean as tolerated.  Keep O2 sats above 92% - Received Lasix  again today.  Some improvement in crackles on reevaluation. - Echocardiogram is ordered and pending.  A-fib RVR - Status post 5 mg IV push metoprolol  with some improvement, rate ultimately back in the 170s -- Echocardiogram pending -- Starting amiodarone  - Continue close monitoring of mag, Phos, potassium. - Cardiology consulted, appreciate recommendations  Influenza A with superimposed bacterial pneumonia - ProCal 49, initial chest x-ray clear but next day showed bilateral lung base opacities - Continue Tamiflu  - Been on ceftriaxone  and azithromycin -will change to doxycycline  now given new amiodarone .  COPD exacerbation - Cough with sputum production, status post Solu-Medrol  125 mg in ED - Continue with methylprednisolone  80 mg every 12 - DuoNebs, continue saline nebs, continue Mucinex  - Hypertonic saline nebs  Possible benzodiazepine withdrawal - Patient is a longtime user of Xanax , recently changed to Valium .  Originally psych consults recommended not to resume Xanax  but patient has been persistently tachycardic, tachypneic, and diaphoretic.  He was reengaged and resumed home dose Valium  5 mg every 12 which is consistent with home dose.  Substance-induced psychosis Bipolar type I Ebkom's delusional parasitosis Medication nonadherence - Psychiatry consulted - Continue Xanax  and Abilify .  Psychiatry has recommended holding Celexa  - Psychiatry to follow, will eventually discharged to inpatient psych once medically improved  Hyponatremia - Improving - Continue to  monitor  AKI - Baseline creatinine appears to be close to 0.8, currently 1.1 - Subsequent rise in BUN, likely prerenal in etiology.  Has been on IV fluids, will continue to monitor BMP closely  DVT prophylaxis: lovenox    Code Status: Full Code Family Communication: Discussed directly with patient Disposition:  Status is: Inpatient, workup ongoing.  Medically unstable at this time.  Will eventually discharge to inpatient psychiatry unit     Consultants:  Cardiology    Procedures:    Antimicrobials:  Anti-infectives (From admission, onward)    Start     Dose/Rate Route Frequency Ordered Stop   08/18/23 0700  azithromycin  (ZITHROMAX ) 500 mg in sodium chloride  0.9 % 250 mL IVPB        500 mg 250 mL/hr over 60 Minutes Intravenous Every 24 hours 08/17/23 1848 08/22/23 0659   08/18/23 0700  cefTRIAXone  (ROCEPHIN ) 2 g in sodium chloride  0.9 % 100 mL IVPB        2 g 200 mL/hr over 30 Minutes Intravenous Every 24 hours 08/17/23 1848 08/22/23 0659   08/17/23 0715  cefTRIAXone  (ROCEPHIN ) 2 g in sodium chloride  0.9 % 100 mL IVPB        2 g 200 mL/hr over 30 Minutes Intravenous  Once 08/17/23 0703 08/17/23 0742   08/17/23 0715  azithromycin  (ZITHROMAX ) 500 mg  in sodium chloride  0.9 % 250 mL IVPB        500 mg 250 mL/hr over 60 Minutes Intravenous  Once 08/17/23 0703 08/17/23 0832   08/16/23 1530  oseltamivir  (TAMIFLU ) capsule 75 mg        75 mg Oral 2 times daily 08/16/23 1513 08/19/23 0459       Data Reviewed: I have personally reviewed following labs and imaging studies CBC: Recent Labs  Lab 08/16/23 0328 08/17/23 0606 08/18/23 0611  WBC 7.5 2.1* 3.8*  NEUTROABS 5.6 1.2*  --   HGB 13.6 13.4 13.4  HCT 38.5 37.9 38.7  MCV 88.1 87.7 88.0  PLT 245 191 147*   Basic Metabolic Panel: Recent Labs  Lab 08/16/23 0748 08/17/23 0606 08/18/23 0611  NA 135 125* 129*  K 3.1* 3.0* 4.2  CL 97* 94* 97*  CO2 26 18* 22  GLUCOSE 134* 100* 119*  BUN 18 19 26*  CREATININE 0.86 1.11*  1.11*  CALCIUM  8.3* 7.6* 8.3*  MG  --   --  1.8  PHOS  --   --  3.4   GFR: Estimated Creatinine Clearance: 56 mL/min (A) (by C-G formula based on SCr of 1.11 mg/dL (H)). Liver Function Tests: Recent Labs  Lab 08/16/23 0748 08/17/23 0606  AST 33 98*  ALT 21 32  ALKPHOS 64 37*  BILITOT 0.7 1.0  PROT 6.8 6.1*  ALBUMIN  3.8 3.0*   CBG: No results for input(s): GLUCAP in the last 168 hours.  Recent Results (from the past 240 hours)  Resp panel by RT-PCR (RSV, Flu A&B, Covid) Anterior Nasal Swab     Status: Abnormal   Collection Time: 08/15/23 11:51 PM   Specimen: Anterior Nasal Swab  Result Value Ref Range Status   SARS Coronavirus 2 by RT PCR NEGATIVE NEGATIVE Final    Comment: (NOTE) SARS-CoV-2 target nucleic acids are NOT DETECTED.  The SARS-CoV-2 RNA is generally detectable in upper respiratory specimens during the acute phase of infection. The lowest concentration of SARS-CoV-2 viral copies this assay can detect is 138 copies/mL. A negative result does not preclude SARS-Cov-2 infection and should not be used as the sole basis for treatment or other patient management decisions. A negative result may occur with  improper specimen collection/handling, submission of specimen other than nasopharyngeal swab, presence of viral mutation(s) within the areas targeted by this assay, and inadequate number of viral copies(<138 copies/mL). A negative result must be combined with clinical observations, patient history, and epidemiological information. The expected result is Negative.  Fact Sheet for Patients:  bloggercourse.com  Fact Sheet for Healthcare Providers:  seriousbroker.it  This test is no t yet approved or cleared by the [place]  FDA and  has been authorized for detection and/or diagnosis of SARS-CoV-2 by FDA under an Emergency Use Authorization (EUA). This EUA will remain  in effect (meaning this test can be  used) for the duration of the COVID-19 declaration under Section 564(b)(1) of the Act, 21 U.S.C.section 360bbb-3(b)(1), unless the authorization is terminated  or revoked sooner.       Influenza A by PCR POSITIVE (A) NEGATIVE Final   Influenza B by PCR NEGATIVE NEGATIVE Final    Comment: (NOTE) The Xpert Xpress SARS-CoV-2/FLU/RSV plus assay is intended as an aid in the diagnosis of influenza from Nasopharyngeal swab specimens and should not be used as a sole basis for treatment. Nasal washings and aspirates are unacceptable for Xpert Xpress SARS-CoV-2/FLU/RSV testing.  Fact Sheet for Patients: bloggercourse.com  Fact  Sheet for Healthcare Providers: seriousbroker.it  This test is not yet approved or cleared by the [place]  FDA and has been authorized for detection and/or diagnosis of SARS-CoV-2 by FDA under an Emergency Use Authorization (EUA). This EUA will remain in effect (meaning this test can be used) for the duration of the COVID-19 declaration under Section 564(b)(1) of the Act, 21 U.S.C. section 360bbb-3(b)(1), unless the authorization is terminated or revoked.     Resp Syncytial Virus by PCR NEGATIVE NEGATIVE Final    Comment: (NOTE) Fact Sheet for Patients: bloggercourse.com  Fact Sheet for Healthcare Providers: seriousbroker.it  This test is not yet approved or cleared by the [place]  FDA and has been authorized for detection and/or diagnosis of SARS-CoV-2 by FDA under an Emergency Use Authorization (EUA). This EUA will remain in effect (meaning this test can be used) for the duration of the COVID-19 declaration under Section 564(b)(1) of the Act, 21 U.S.C. section 360bbb-3(b)(1), unless the authorization is terminated or revoked.  Performed at [facility], [address] [place], [place] [address]   Blood culture (routine x 2)     Status:  None (Preliminary result)   Collection Time: 08/17/23  6:05 AM   Specimen: BLOOD  Result Value Ref Range Status   Specimen Description BLOOD BLOOD LEFT ARM  Final   Special Requests   Final    BOTTLES DRAWN AEROBIC AND ANAEROBIC Blood Culture results may not be optimal due to an inadequate volume of blood received in culture bottles   Culture   Final    NO GROWTH < 24 HOURS Performed at [facility], [address], [place], [place] [address]    Report Status PENDING  Incomplete  Blood culture (routine x 2)     Status: None (Preliminary result)   Collection Time: 08/17/23  6:05 AM   Specimen: BLOOD  Result Value Ref Range Status   Specimen Description BLOOD BLOOD LEFT HAND  Final   Special Requests   Final    BOTTLES DRAWN AEROBIC AND ANAEROBIC Blood Culture results may not be optimal due to an inadequate volume of blood received in culture bottles   Culture   Final    NO GROWTH < 24 HOURS Performed at [facility], [address] [place], [place] [address]    Report Status PENDING  Incomplete     Radiology Studies: DG Chest Portable 1 View Result Date: 08/17/2023 CLINICAL DATA:  62 year old female with shortness of breath, hypoxia. EXAM: PORTABLE CHEST 1 VIEW COMPARISON:  Portable chest 08/16/2023. FINDINGS: Portable AP upright view at 0615 hours. Similar low lung volumes but confluent new bilateral lung base opacity, worse on the right and obscuring the right hemidiaphragm now. Stable visible mediastinal contours. No superimposed pneumothorax. Upper lung vascularity appears stable. Right pleural effusion is difficult to exclude. Stable visualized osseous structures. Negative visible bowel gas. IMPRESSION: Confluent new right greater than left lung base opacity since yesterday most suspicious for Aspiration. Otherwise consider bilateral pneumonia. New right pleural effusion not excluded. Electronically Signed   By: VEAR Hurst M.D.   On: 08/17/2023 06:59    Scheduled  Meds:  allopurinol   100 mg Oral Daily   ARIPiprazole   5 mg Oral Daily   aspirin  EC  81 mg Oral Daily   diazepam   5 mg Oral Q12H   enoxaparin  (LOVENOX ) injection  40 mg Subcutaneous Q24H   ipratropium-albuterol   3 mL Nebulization Q6H   ondansetron  (ZOFRAN ) IV  4 mg Intravenous Once  oseltamivir   75 mg Oral BID   pantoprazole   40 mg Oral Daily   predniSONE   40 mg Oral Q breakfast   sodium chloride  HYPERTONIC  4 mL Nebulization BID   Continuous Infusions:  azithromycin  (ZITHROMAX ) 500 mg in sodium chloride  0.9 % 250 mL IVPB 500 mg (08/18/23 0625)   cefTRIAXone  (ROCEPHIN )  IV 2 g (08/18/23 0628)     LOS: 1 day    CRITICAL CARE Total critical care time: 55 minutes Critical care was time spent personally by me on the following activities: development of treatment plan with patient and/or surrogate as well as nursing, discussions with consultants, evaluation of patient's response to treatment, examination of patient, obtaining history from patient or surrogate, ordering and performing treatments and interventions, ordering and review of laboratory studies, ordering and review of radiographic studies, pulse oximetry and re-evaluation of patient's condition.   Ninoska Goswick, DO [facility]  To contact the attending physician between 7A-7P please use Epic Chat. To contact the covering physician during after hours 7P-7A, please review Amion.   08/18/2023, 9:33 AM   *This document has been created with the assistance of dictation software. Please excuse typographical errors. *

## 2023-08-18 NOTE — Significant Event (Signed)
 Rapid Response Event Note   Reason for Call :  Elevated heart rate  Initial Focused Assessment:  Rapid response RN arrived in patient's room with patient sitting up in bed surrounded by 2A staff. Patient alert but moaning. Work of breathing elevated and tachypneic with RR in upper 30s. Ozygen saturations 94% on 6 L nasal cannula. HR 180s appeared to be SVT. Patient reports 10 out of 10 chest pain. Patient unable to perform vagal manuveurs, kept stating she can't due to her breathing difficulties. Lungs congested and wheezy.   Interventions:  12 lead EKG obtained, showed afib RVR in 180s. 2.5 mg of metoprolol  IV given twice per orders from Dr. Jearldine. Just before first dose HR 170s-180s and BP 137/84 MAP 101. After first dose HR 130s-150s and BP 120/93 MAP 101. After second dose HR 120s-130s and BP 121/90 (97). Patient placed on bipap and given breathing treatment by Amy RT per orders from Dr. Dezil. After placed on bipap, assisted patient onto bedpan with patient's nurse Renda, after patient stated she needed to have a BM. Patient peed but did not have a BM.  Patient also given prn morphine  as it was then available for pain and was repositioned per patient request. Lungs no longer sounded wheezy after bipap and breathing treatment but were still very congested. Work of breathing appeared slightly better.  Plan of Care:  Patient to remain on 2A for now. Dr. Jearldine consulted cardiology. 2A staff to reach back out to rapid response team if any further needs.  Event Summary:   MD Notified: Dr. Jearldine Call Time: 13:40 Arrival Time: 13:41 End Time: 14:13  Adithi Gammon, LAURAINE NORRIS, RN
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
## 2023-08-18 NOTE — Progress Notes (Signed)
   08/18/23 1300  Spiritual Encounters  Type of Visit Initial  Care provided to: Patient  Conversation partners present during encounter Nurse  Referral source Code page  Reason for visit Code   Chaplain received a rapid response for patient. When chaplain arrived there was no family present and the medical team were getting the patients heart rate lowered. Chaplain services remain available for spiritual and emotional support.

## 2023-08-18 NOTE — ED Notes (Addendum)
 Pt is screaming out in pain in abdomen, right arm, arm pit, and back. Pt was just given PRN morphine  at 2338. Pt's doctor made aware. Pt is stating she will "leave and run out in front of a bus if she don't get her pain under control".

## 2023-08-18 NOTE — ED Notes (Addendum)
 Pt contact made and myself introduced. Pt CAOx4 and breathing normally. Pt is normal in color. Pt is complaining of 10/10 pain in ribs and abdomen. PT requested pain meds at this time. Pt noted to be tachycardic, however report from last nurse was that that is not abnormal for her.
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
## 2023-08-19 ENCOUNTER — Inpatient Hospital Stay: Payer: 59

## 2023-08-19 DIAGNOSIS — I4891 Unspecified atrial fibrillation: Secondary | ICD-10-CM | POA: Diagnosis not present

## 2023-08-19 DIAGNOSIS — I959 Hypotension, unspecified: Secondary | ICD-10-CM | POA: Diagnosis not present

## 2023-08-19 DIAGNOSIS — R739 Hyperglycemia, unspecified: Secondary | ICD-10-CM

## 2023-08-19 DIAGNOSIS — F22 Delusional disorders: Secondary | ICD-10-CM

## 2023-08-19 DIAGNOSIS — J9601 Acute respiratory failure with hypoxia: Secondary | ICD-10-CM

## 2023-08-19 DIAGNOSIS — J09X1 Influenza due to identified novel influenza A virus with pneumonia: Secondary | ICD-10-CM

## 2023-08-19 DIAGNOSIS — J111 Influenza due to unidentified influenza virus with other respiratory manifestations: Secondary | ICD-10-CM | POA: Diagnosis not present

## 2023-08-19 DIAGNOSIS — I480 Paroxysmal atrial fibrillation: Secondary | ICD-10-CM | POA: Diagnosis not present

## 2023-08-19 DIAGNOSIS — E871 Hypo-osmolality and hyponatremia: Secondary | ICD-10-CM

## 2023-08-19 DIAGNOSIS — F19959 Other psychoactive substance use, unspecified with psychoactive substance-induced psychotic disorder, unspecified: Secondary | ICD-10-CM

## 2023-08-19 DIAGNOSIS — J189 Pneumonia, unspecified organism: Secondary | ICD-10-CM | POA: Diagnosis not present

## 2023-08-19 DIAGNOSIS — J449 Chronic obstructive pulmonary disease, unspecified: Secondary | ICD-10-CM

## 2023-08-19 LAB — HEPATIC FUNCTION PANEL
ALT: 34 U/L (ref 0–44)
AST: 46 U/L — ABNORMAL HIGH (ref 15–41)
Albumin: 2.4 g/dL — ABNORMAL LOW (ref 3.5–5.0)
Alkaline Phosphatase: 33 U/L — ABNORMAL LOW (ref 38–126)
Bilirubin, Direct: 0.2 mg/dL (ref 0.0–0.2)
Indirect Bilirubin: 0.7 mg/dL (ref 0.3–0.9)
Total Bilirubin: 0.9 mg/dL (ref 0.0–1.2)
Total Protein: 5.8 g/dL — ABNORMAL LOW (ref 6.5–8.1)

## 2023-08-19 LAB — BLOOD GAS, ARTERIAL
Acid-base deficit: 3.8 mmol/L — ABNORMAL HIGH (ref 0.0–2.0)
Acid-base deficit: 2.7 mmol/L — ABNORMAL HIGH (ref 0.0–2.0)
Acid-base deficit: 7.4 mmol/L — ABNORMAL HIGH (ref 0.0–2.0)
Bicarbonate: 23.1 mmol/L (ref 20.0–28.0)
Bicarbonate: 22.6 mmol/L (ref 20.0–28.0)
Bicarbonate: 18.1 mmol/L — ABNORMAL LOW (ref 20.0–28.0)
Delivery systems: POSITIVE
Expiratory PAP: 5 cm[H2O]
FIO2: 75 %
FIO2: 60 %
Inspiratory PAP: 10 cm[H2O]
MECHVT: 450 mL
Mechanical Rate: 18
O2 Content: 10 L/min
O2 Saturation: 95.8 %
O2 Saturation: 100 %
O2 Saturation: 99.6 %
PEEP: 10 cmH2O
Patient temperature: 37
Patient temperature: 37
Patient temperature: 37
pCO2 arterial: 48 mm[Hg] (ref 32–48)
pCO2 arterial: 40 mm[Hg] (ref 32–48)
pCO2 arterial: 36 mm[Hg] (ref 32–48)
pH, Arterial: 7.29 — ABNORMAL LOW (ref 7.35–7.45)
pH, Arterial: 7.36 (ref 7.35–7.45)
pH, Arterial: 7.31 — ABNORMAL LOW (ref 7.35–7.45)
pO2, Arterial: 71 mm[Hg] — ABNORMAL LOW (ref 83–108)
pO2, Arterial: 181 mm[Hg] — ABNORMAL HIGH (ref 83–108)
pO2, Arterial: 149 mm[Hg] — ABNORMAL HIGH (ref 83–108)

## 2023-08-19 LAB — BASIC METABOLIC PANEL
Anion gap: 15 (ref 5–15)
BUN: 44 mg/dL — ABNORMAL HIGH (ref 8–23)
CO2: 20 mmol/L — ABNORMAL LOW (ref 22–32)
Calcium: 8.8 mg/dL — ABNORMAL LOW (ref 8.9–10.3)
Chloride: 94 mmol/L — ABNORMAL LOW (ref 98–111)
Creatinine, Ser: 1.04 mg/dL — ABNORMAL HIGH (ref 0.44–1.00)
GFR, Estimated: >60 mL/min (ref >60)
Glucose, Bld: 177 mg/dL — ABNORMAL HIGH (ref 70–99)
Potassium: 3.8 mmol/L (ref 3.5–5.1)
Sodium: 129 mmol/L — ABNORMAL LOW (ref 135–145)

## 2023-08-19 LAB — URINALYSIS, ROUTINE W REFLEX MICROSCOPIC
Bacteria, UA: NONE SEEN
Bilirubin Urine: NEGATIVE
Glucose, UA: NEGATIVE mg/dL
Ketones, ur: NEGATIVE mg/dL
Leukocytes,Ua: NEGATIVE
Nitrite: NEGATIVE
Protein, ur: 30 mg/dL — AB
Specific Gravity, Urine: 1.018 (ref 1.005–1.030)
pH: 5 (ref 5.0–8.0)

## 2023-08-19 LAB — CBC
HCT: 34 % — ABNORMAL LOW (ref 36.0–46.0)
Hemoglobin: 11.9 g/dL — ABNORMAL LOW (ref 12.0–15.0)
MCH: 30.8 pg (ref 26.0–34.0)
MCHC: 35 g/dL (ref 30.0–36.0)
MCV: 88.1 fL (ref 80.0–100.0)
Platelets: 223 10*3/uL (ref 150–400)
RBC: 3.86 MIL/uL — ABNORMAL LOW (ref 3.87–5.11)
RDW: 12.2 % (ref 11.5–15.5)
WBC: 8.9 10*3/uL (ref 4.0–10.5)
nRBC: 0.9 % — ABNORMAL HIGH (ref 0.0–0.2)

## 2023-08-19 LAB — PROTIME-INR
INR: 1.2 (ref 0.8–1.2)
Prothrombin Time: 15.2 s (ref 11.4–15.2)

## 2023-08-19 LAB — HEPARIN LEVEL (UNFRACTIONATED): Heparin Unfractionated: 0.1 [IU]/mL — ABNORMAL LOW (ref 0.30–0.70)

## 2023-08-19 LAB — HEMOGLOBIN A1C
Hgb A1c MFr Bld: 5.5 % (ref 4.8–5.6)
Mean Plasma Glucose: 111.15 mg/dL

## 2023-08-19 LAB — GLUCOSE, CAPILLARY
Glucose-Capillary: 146 mg/dL — ABNORMAL HIGH (ref 70–99)
Glucose-Capillary: 146 mg/dL — ABNORMAL HIGH (ref 70–99)
Glucose-Capillary: 161 mg/dL — ABNORMAL HIGH (ref 70–99)
Glucose-Capillary: 169 mg/dL — ABNORMAL HIGH (ref 70–99)
Glucose-Capillary: 171 mg/dL — ABNORMAL HIGH (ref 70–99)

## 2023-08-19 LAB — STREP PNEUMONIAE URINARY ANTIGEN: Strep Pneumo Urinary Antigen: NEGATIVE

## 2023-08-19 LAB — TSH: TSH: 0.215 u[IU]/mL — ABNORMAL LOW (ref 0.350–4.500)

## 2023-08-19 LAB — MAGNESIUM: Magnesium: 3.1 mg/dL — ABNORMAL HIGH (ref 1.7–2.4)

## 2023-08-19 LAB — PHOSPHORUS: Phosphorus: 3.1 mg/dL (ref 2.5–4.6)

## 2023-08-19 LAB — BRAIN NATRIURETIC PEPTIDE: B Natriuretic Peptide: 546.7 pg/mL — ABNORMAL HIGH (ref 0.0–100.0)

## 2023-08-19 LAB — PROCALCITONIN: Procalcitonin: 32.89 ng/mL

## 2023-08-19 MED ORDER — SUCCINYLCHOLINE CHLORIDE 200 MG/10ML IV SOSY
1.5000 mg/kg | PREFILLED_SYRINGE | Freq: Once | INTRAVENOUS | Status: AC
  Administered 2023-08-19: 123.2 mg via INTRAVENOUS

## 2023-08-19 MED ORDER — DEXMEDETOMIDINE HCL IN NACL 400 MCG/100ML IV SOLN
0.0000 ug/kg/h | INTRAVENOUS | Status: DC
  Administered 2023-08-19: 1.5 ug/kg/h via INTRAVENOUS
  Administered 2023-08-19: 0.4 ug/kg/h via INTRAVENOUS
  Filled 2023-08-19: qty 100

## 2023-08-19 MED ORDER — POLYETHYLENE GLYCOL 3350 17 G PO PACK
17.0000 g | PACK | Freq: Every day | ORAL | Status: DC
  Administered 2023-08-20 – 2023-08-22 (×3): 17 g
  Filled 2023-08-19 (×3): qty 1

## 2023-08-19 MED ORDER — HALOPERIDOL LACTATE 5 MG/ML IJ SOLN
1.0000 mg | Freq: Four times a day (QID) | INTRAMUSCULAR | Status: DC | PRN
  Administered 2023-08-19: 2 mg via INTRAVENOUS
  Filled 2023-08-19: qty 1

## 2023-08-19 MED ORDER — PROPOFOL 1000 MG/100ML IV EMUL
INTRAVENOUS | Status: AC
  Administered 2023-08-19: 10 ug/kg/min via INTRAVENOUS
  Filled 2023-08-19: qty 100

## 2023-08-19 MED ORDER — HEPARIN BOLUS VIA INFUSION
2300.0000 [IU] | Freq: Once | INTRAVENOUS | Status: AC
Start: 2023-08-19 — End: 2023-08-19
  Administered 2023-08-19: 2300 [IU] via INTRAVENOUS
  Filled 2023-08-19: qty 2300

## 2023-08-19 MED ORDER — FENTANYL CITRATE PF 50 MCG/ML IJ SOSY
50.0000 ug | PREFILLED_SYRINGE | INTRAMUSCULAR | Status: DC | PRN
  Administered 2023-08-20: 50 ug via INTRAVENOUS
  Filled 2023-08-19 (×3): qty 1

## 2023-08-19 MED ORDER — INSULIN ASPART 100 UNIT/ML IJ SOLN
0.0000 [IU] | INTRAMUSCULAR | Status: DC
  Administered 2023-08-19 – 2023-08-21 (×11): 2 [IU] via SUBCUTANEOUS
  Administered 2023-08-21: 3 [IU] via SUBCUTANEOUS
  Administered 2023-08-21: 2 [IU] via SUBCUTANEOUS
  Administered 2023-08-21: 3 [IU] via SUBCUTANEOUS
  Administered 2023-08-21: 2 [IU] via SUBCUTANEOUS
  Administered 2023-08-22: 3 [IU] via SUBCUTANEOUS
  Administered 2023-08-22 (×2): 2 [IU] via SUBCUTANEOUS
  Administered 2023-08-22: 5 [IU] via SUBCUTANEOUS
  Administered 2023-08-22 – 2023-08-23 (×2): 2 [IU] via SUBCUTANEOUS
  Administered 2023-08-23: 1 [IU] via SUBCUTANEOUS
  Administered 2023-08-23: 3 [IU] via SUBCUTANEOUS
  Administered 2023-08-23: 2 [IU] via SUBCUTANEOUS
  Administered 2023-08-23: 1 [IU] via SUBCUTANEOUS
  Administered 2023-08-23 – 2023-08-24 (×3): 2 [IU] via SUBCUTANEOUS
  Filled 2023-08-19 (×28): qty 1

## 2023-08-19 MED ORDER — GUAIFENESIN 100 MG/5ML PO LIQD
300.0000 mg | Freq: Four times a day (QID) | ORAL | Status: DC
  Administered 2023-08-20 – 2023-08-24 (×17): 300 mg
  Filled 2023-08-19 (×2): qty 20
  Filled 2023-08-19: qty 10
  Filled 2023-08-19 (×2): qty 20
  Filled 2023-08-19: qty 10
  Filled 2023-08-19 (×5): qty 20
  Filled 2023-08-19: qty 30
  Filled 2023-08-19: qty 10
  Filled 2023-08-19: qty 20
  Filled 2023-08-19 (×2): qty 10
  Filled 2023-08-19: qty 20

## 2023-08-19 MED ORDER — HEPARIN (PORCINE) 25000 UT/250ML-% IV SOLN
1900.0000 [IU]/h | INTRAVENOUS | Status: DC
Start: 2023-08-19 — End: 2023-08-22
  Administered 2023-08-19: 1200 [IU]/h via INTRAVENOUS
  Administered 2023-08-20 – 2023-08-21 (×2): 1900 [IU]/h via INTRAVENOUS
  Filled 2023-08-19 (×4): qty 250

## 2023-08-19 MED ORDER — ACETAMINOPHEN 325 MG PO TABS
650.0000 mg | ORAL_TABLET | ORAL | Status: DC | PRN
  Administered 2023-08-20 – 2023-08-22 (×5): 650 mg
  Filled 2023-08-19 (×5): qty 2

## 2023-08-19 MED ORDER — VANCOMYCIN HCL 1250 MG/250ML IV SOLN
1250.0000 mg | INTRAVENOUS | Status: DC
  Administered 2023-08-20: 1250 mg via INTRAVENOUS
  Filled 2023-08-19: qty 250

## 2023-08-19 MED ORDER — SODIUM CHLORIDE 0.9 % IV SOLN: 250.0000 mL | INTRAVENOUS | Status: AC

## 2023-08-19 MED ORDER — HALOPERIDOL LACTATE 5 MG/ML IJ SOLN: 2.0000 mg | INTRAMUSCULAR | Status: AC

## 2023-08-19 MED ORDER — PROPOFOL 1000 MG/100ML IV EMUL
0.0000 ug/kg/min | INTRAVENOUS | Status: DC
  Administered 2023-08-19: 45 ug/kg/min via INTRAVENOUS
  Administered 2023-08-19: 40 ug/kg/min via INTRAVENOUS
  Administered 2023-08-20 – 2023-08-21 (×9): 50 ug/kg/min via INTRAVENOUS
  Administered 2023-08-21: 40 ug/kg/min via INTRAVENOUS
  Administered 2023-08-22: 50 ug/kg/min via INTRAVENOUS
  Administered 2023-08-22: 40 ug/kg/min via INTRAVENOUS
  Administered 2023-08-22 (×2): 50 ug/kg/min via INTRAVENOUS
  Administered 2023-08-22: 30 ug/kg/min via INTRAVENOUS
  Administered 2023-08-23: 50 ug/kg/min via INTRAVENOUS
  Administered 2023-08-23 – 2023-08-24 (×4): 40 ug/kg/min via INTRAVENOUS
  Administered 2023-08-24 (×2): 50 ug/kg/min via INTRAVENOUS
  Filled 2023-08-19 (×26): qty 100

## 2023-08-19 MED ORDER — ASPIRIN 81 MG PO CHEW
81.0000 mg | CHEWABLE_TABLET | Freq: Every day | ORAL | Status: DC
Start: 2023-08-20 — End: 2023-08-24
  Administered 2023-08-20 – 2023-08-24 (×5): 81 mg
  Filled 2023-08-19 (×5): qty 1

## 2023-08-19 MED ORDER — FENTANYL CITRATE PF 50 MCG/ML IJ SOSY
50.0000 ug | PREFILLED_SYRINGE | INTRAMUSCULAR | Status: DC | PRN
  Administered 2023-08-19: 100 ug via INTRAVENOUS
  Administered 2023-08-20: 200 ug via INTRAVENOUS
  Administered 2023-08-20 – 2023-08-22 (×3): 100 ug via INTRAVENOUS
  Filled 2023-08-19 (×3): qty 2
  Filled 2023-08-19: qty 4

## 2023-08-19 MED ORDER — PIPERACILLIN-TAZOBACTAM 3.375 G IVPB
3.3750 g | Freq: Three times a day (TID) | INTRAVENOUS | Status: DC
Start: 2023-08-19 — End: 2023-08-22
  Administered 2023-08-19 – 2023-08-22 (×9): 3.375 g via INTRAVENOUS
  Filled 2023-08-19 (×9): qty 50

## 2023-08-19 MED ORDER — FENTANYL 2500MCG IN NS 250ML (10MCG/ML) PREMIX INFUSION
INTRAVENOUS | Status: AC
  Administered 2023-08-19: 100 ug/h
  Filled 2023-08-19: qty 250

## 2023-08-19 MED ORDER — DIAZEPAM 5 MG/ML IJ SOLN: 2.5000 mg | Freq: Once | INTRAMUSCULAR | Status: DC

## 2023-08-19 MED ORDER — DIAZEPAM 5 MG/ML IJ SOLN: 2.5000 mg | Freq: Once | INTRAMUSCULAR | Status: AC

## 2023-08-19 MED ORDER — DOCUSATE SODIUM 50 MG/5ML PO LIQD
100.0000 mg | Freq: Two times a day (BID) | ORAL | Status: DC
  Administered 2023-08-19 – 2023-08-24 (×8): 100 mg
  Filled 2023-08-19 (×8): qty 10

## 2023-08-19 MED ORDER — PANTOPRAZOLE SODIUM 40 MG IV SOLR
40.0000 mg | INTRAVENOUS | Status: DC
  Administered 2023-08-19 – 2023-08-24 (×6): 40 mg via INTRAVENOUS
  Filled 2023-08-19 (×6): qty 10

## 2023-08-19 MED ORDER — SUCCINYLCHOLINE CHLORIDE 200 MG/10ML IV SOSY
PREFILLED_SYRINGE | INTRAVENOUS | Status: AC
  Filled 2023-08-19: qty 10

## 2023-08-19 MED ORDER — SALINE SPRAY 0.65 % NA SOLN
1.0000 | NASAL | Status: DC | PRN
  Filled 2023-08-19: qty 44

## 2023-08-19 MED ORDER — METHYLPREDNISOLONE SODIUM SUCC 40 MG IJ SOLR
40.0000 mg | Freq: Two times a day (BID) | INTRAMUSCULAR | Status: DC
  Administered 2023-08-19 – 2023-08-20 (×2): 40 mg via INTRAVENOUS
  Filled 2023-08-19 (×2): qty 1

## 2023-08-19 MED ORDER — ETOMIDATE 2 MG/ML IV SOLN
INTRAVENOUS | Status: AC
  Administered 2023-08-19: 20 mg via INTRAVENOUS
  Filled 2023-08-19: qty 10

## 2023-08-19 MED ORDER — ARIPIPRAZOLE 5 MG PO TABS
5.0000 mg | ORAL_TABLET | Freq: Every day | ORAL | Status: DC
  Administered 2023-08-20: 5 mg
  Filled 2023-08-19: qty 1

## 2023-08-19 MED ORDER — DIAZEPAM 5 MG/ML IJ SOLN
5.0000 mg | Freq: Two times a day (BID) | INTRAMUSCULAR | Status: DC
Start: 2023-08-19 — End: 2023-08-20
  Administered 2023-08-19 – 2023-08-20 (×2): 5 mg via INTRAVENOUS
  Filled 2023-08-19 (×2): qty 2

## 2023-08-19 MED ORDER — DIAZEPAM 5 MG/ML IJ SOLN
2.5000 mg | Freq: Once | INTRAMUSCULAR | Status: AC
  Administered 2023-08-19: 2.5 mg via INTRAVENOUS
  Filled 2023-08-19: qty 2

## 2023-08-19 MED ORDER — METOPROLOL TARTRATE 25 MG PO TABS: 25.0000 mg | ORAL_TABLET | Freq: Four times a day (QID) | ORAL | Status: DC

## 2023-08-19 MED ORDER — ALLOPURINOL 100 MG PO TABS
100.0000 mg | ORAL_TABLET | Freq: Every day | ORAL | Status: DC
  Administered 2023-08-20 – 2023-08-24 (×5): 100 mg
  Filled 2023-08-19 (×5): qty 1

## 2023-08-19 MED ORDER — ETOMIDATE 2 MG/ML IV SOLN: 20.0000 mg | Freq: Once | INTRAVENOUS | Status: AC

## 2023-08-19 MED ORDER — NOREPINEPHRINE 4 MG/250ML-% IV SOLN
0.0000 ug/min | INTRAVENOUS | Status: DC
  Administered 2023-08-19: 2 ug/min via INTRAVENOUS
  Administered 2023-08-20 – 2023-08-21 (×2): 4 ug/min via INTRAVENOUS
  Administered 2023-08-24: 2 ug/min via INTRAVENOUS
  Filled 2023-08-19 (×6): qty 250

## 2023-08-19 MED ORDER — MAGNESIUM SULFATE 2 GM/50ML IV SOLN
2.0000 g | Freq: Once | INTRAVENOUS | Status: AC
  Administered 2023-08-19: 2 g via INTRAVENOUS
  Filled 2023-08-19: qty 50

## 2023-08-19 MED ORDER — CHLORHEXIDINE GLUCONATE CLOTH 2 % EX PADS
6.0000 | MEDICATED_PAD | Freq: Every day | CUTANEOUS | Status: DC
  Administered 2023-08-19 – 2023-08-24 (×6): 6 via TOPICAL

## 2023-08-19 MED ORDER — VANCOMYCIN HCL 1750 MG/350ML IV SOLN
1750.0000 mg | Freq: Once | INTRAVENOUS | Status: AC
  Administered 2023-08-19: 1750 mg via INTRAVENOUS
  Filled 2023-08-19: qty 350

## 2023-08-19 MED ORDER — DIAZEPAM 5 MG/ML IJ SOLN
INTRAMUSCULAR | Status: AC
  Administered 2023-08-19: 2.5 mg via INTRAVENOUS
  Filled 2023-08-19: qty 2

## 2023-08-19 NOTE — Progress Notes (Signed)
 Progress Note  Patient Name: Kaylee [name] Date of Encounter: 08/19/2023  Primary Cardiologist: Perla  Subjective   Transferred to the ICU this morning with progressive respiratory distress requiring BiPAP in the setting of influenza A and multilobar pneumonia.  Remains in A-fib with ventricular rates in the low 100s to 120s bpm.  Inpatient Medications    Scheduled Meds:  allopurinol   100 mg Oral Daily   ARIPiprazole   5 mg Oral Daily   aspirin  EC  81 mg Oral Daily   Chlorhexidine  Gluconate Cloth  6 each Topical Daily   diazepam   5 mg Intravenous Q12H   guaiFENesin   600 mg Oral BID   insulin  aspart  0-9 Units Subcutaneous Q4H   ipratropium-albuterol   3 mL Nebulization Q6H   methylPREDNISolone  (SOLU-MEDROL ) injection  40 mg Intravenous Q12H   metoprolol  tartrate  25 mg Oral Q6H   ondansetron  (ZOFRAN ) IV  4 mg Intravenous Once   pantoprazole   40 mg Oral Daily   sodium chloride  HYPERTONIC  4 mL Nebulization BID   Continuous Infusions:  sodium chloride      amiodarone  30 mg/hr (08/19/23 0846)   azithromycin  (ZITHROMAX ) 500 mg in sodium chloride  0.9 % 250 mL IVPB 500 mg (08/18/23 0625)   dexmedetomidine  (PRECEDEX ) IV infusion 1.2 mcg/kg/hr (08/19/23 0842)   diltiazem  (CARDIZEM ) infusion 15 mg/hr (08/19/23 0334)   heparin  1,200 Units/hr (08/19/23 0855)   magnesium  sulfate bolus IVPB     norepinephrine  (LEVOPHED ) Adult infusion 2 mcg/min (08/19/23 0915)   piperacillin -tazobactam (ZOSYN )  IV     vancomycin  1,750 mg (08/19/23 0843)   PRN Meds: acetaminophen , albuterol , benzonatate , sodium chloride    Vital Signs    Vitals:   08/19/23 0724 08/19/23 0752 08/19/23 0800 08/19/23 0959  BP:   127/83   Pulse: (!) 31 (!) 105 87 95  Resp: (!) 26 (!) 37 (!) 41 (!) 38  Temp: 97.9 F (36.6 C)  97.9 F (36.6 C)   TempSrc: Oral     SpO2: 99% 98% 98% 93%  Weight:      Height:        Intake/Output Summary (Last 24 hours) at 08/19/2023 1013 Last data filed at 08/19/2023 0650 Gross  per 24 hour  Intake 489.02 ml  Output 300 ml  Net 189.02 ml   Filed Weights   08/15/23 2344 08/18/23 0207  Weight: 77.1 kg 82.1 kg    Telemetry    A-fib with RVR with ventricular rates from the low 100s to 120s bpm - Personally Reviewed  ECG    No new tracings - Personally Reviewed  Physical Exam   GEN: No acute distress.  Neck: JVD difficult to assess secondary to respiratory support apparatus. Cardiac: Tachycardic and irregularly irregular, no murmurs, rubs, or gallops.  Respiratory: Coarse and diminished breath sounds bilaterally with scattered rhonchi.  BiPAP in place. GI: Soft, nontender, non-distended.   MS: No edema; No deformity. Neuro:  Alert; Nonfocal.  Psych: Agitated and restless.  Labs    Chemistry Recent Labs  Lab 08/16/23 0748 08/17/23 0606 08/18/23 0611  NA 135 125* 129*  K 3.1* 3.0* 4.2  CL 97* 94* 97*  CO2 26 18* 22  GLUCOSE 134* 100* 119*  BUN 18 19 26*  CREATININE 0.86 1.11* 1.11*  CALCIUM  8.3* 7.6* 8.3*  PROT 6.8 6.1*  --   ALBUMIN  3.8 3.0*  --   AST 33 98*  --   ALT 21 32  --   ALKPHOS 64 37*  --   BILITOT  0.7 1.0  --   GFRNONAA >60 56* 56*  ANIONGAP 12 13 10      Hematology Recent Labs  Lab 08/16/23 0328 08/17/23 0606 08/18/23 0611  WBC 7.5 2.1* 3.8*  RBC 4.37 4.32 4.40  HGB 13.6 13.4 13.4  HCT 38.5 37.9 38.7  MCV 88.1 87.7 88.0  MCH 31.1 31.0 30.5  MCHC 35.3 35.4 34.6  RDW 11.7 11.9 11.9  PLT 245 191 147*    Cardiac EnzymesNo results for input(s): TROPONINI in the last 168 hours. No results for input(s): TROPIPOC in the last 168 hours.   BNPNo results for input(s): BNP, PROBNP in the last 168 hours.   DDimer No results for input(s): DDIMER in the last 168 hours.   Radiology    DG Chest Port 1 View Result Date: 08/19/2023 IMPRESSION: 1. Patchy consolidation in the right greater than left mid to lower lung fields and small pleural effusions. Findings consistent with multilobar pneumonia or aspiration. No  interval change in the overall aeration. 2. Mild cardiomegaly and central vascular fullness without overt edema. Electronically Signed   By: Francis Quam M.D.   On: 08/19/2023 01:31    Cardiac Studies   2D echo pending  Patient Profile     63 y.o. female with history of bipolar type I disorder, Ekbom's delusional parasitosis, GAD, prior tobacco abuse, COPD, RA, depression, and chronic pain, admitted with influenza A and pneumonia, who is being seen today for the evaluation of aflutter and afib w/ RVR at the request of Dr. Leesa.   Assessment & Plan    1. Aflutter/Afib w/ RVR:  pt admitted w/ Flu A and PNA.  Noted to be in atrial flutter with RVR on the afternoon of 08/18/2023 with ventricular rates up to 193 bpm.  This was followed by slowing and atrial fibrillation with RVR into the 150s to 170s bpm.  Continue IV amiodarone  and diltiazem  drip for now.  Unable to tolerate oral medication with BiPAP, therefore Lopressor  on hold.  Start heparin  drip, without bolus due to episode of hemoptysis.  CHA2DS2VASc = 1 (female).  If she does not convert on amio, can consider DCCV prior to discharge.  Likely not a great candidate for long-term anticoagulation given comorbid conditions.  Check echo and TSH.  High-sensitivity troponin negative.  2.  Acute hypoxic respiratory failure: Transferred to the ICU this morning requiring BiPAP with increased work of breathing.  Agitated at times.  High risk for intubation.  In the setting of influenza A and multilobar pneumonia.  Ongoing management per CCM.       For questions or updates, please contact [facility] Please consult [email or web address] for contact info under Cardiology/STEMI.    Signed, Bernardino Bring, PA-C [facility] Pager: [phone] 08/19/2023, 10:13 AM
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
## 2023-08-19 NOTE — Procedures (Signed)
 Intubation Procedure Note  DENNIS KILLILEA  [id number]  1961/06/29  Date:08/19/23  Time:2:04 PM   Provider Performing:Clarabelle Oscarson    Procedure: Intubation (31500)  Indication(s) Respiratory Failure  Consent Unable to obtain consent due to emergent nature of procedure.   Anesthesia Etomidate  and Succinylcholine    Time Out Verified patient identification, verified procedure, site/side was marked, verified correct patient position, special equipment/implants available, medications/allergies/relevant history reviewed, required imaging and test results available.   Sterile Technique Usual hand hygeine, masks, and gloves were used   Procedure Description Patient positioned in bed supine.  Sedation given as noted above.  Patient was intubated with endotracheal tube using Glidescope.  View was Grade 1 full glottis .  Number of attempts was 1.  Colorimetric CO2 detector was consistent with tracheal placement.   Complications/Tolerance None; patient tolerated the procedure well. Chest X-ray is ordered to verify placement.   EBL Minimal   Specimen(s) None  [name], MD [facility] 08/19/2023 2:05 PM

## 2023-08-19 NOTE — Procedures (Signed)
 Central Venous Catheter Insertion Procedure Note  Kaylee [name]  [id number]  17-Dec-1960  Date:08/19/23  Time:2:05 PM   Provider Performing:Leroy Trim   Procedure: Insertion of Non-tunneled Central Venous [phone]) with US  guidance (23062)   Indication(s) Medication administration and Difficult access  Consent Risks of the procedure as well as the alternatives and risks of each were explained to the patient and/or caregiver.  Consent for the procedure was obtained and is signed in the bedside chart  Anesthesia Topical only with 1% lidocaine    Timeout Verified patient identification, verified procedure, site/side was marked, verified correct patient position, special equipment/implants available, medications/allergies/relevant history reviewed, required imaging and test results available.  Sterile Technique Maximal sterile technique including full sterile barrier drape, hand hygiene, sterile gown, sterile gloves, mask, hair covering, sterile ultrasound probe cover (if used).  Procedure Description Area of catheter insertion was cleaned with chlorhexidine  and draped in sterile fashion.  With real-time ultrasound guidance a central venous catheter was placed into the right internal jugular vein. Nonpulsatile blood flow and easy flushing noted in all ports.  The catheter was sutured in place and sterile dressing applied.  Complications/Tolerance None; patient tolerated the procedure well. Chest X-ray is ordered to verify placement for internal jugular or subclavian cannulation.   Chest x-ray is not ordered for femoral cannulation.  EBL Minimal  Specimen(s) None  Kaylee November, MD [facility] 08/19/2023 2:06 PM
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
## 2023-08-19 NOTE — Plan of Care (Signed)
  Problem: Education: Goal: Knowledge of General Education information will improve Description: Including pain rating scale, medication(s)/side effects and non-pharmacologic comfort measures Outcome: Progressing   Problem: Health Behavior/Discharge Planning: Goal: Ability to manage health-related needs will improve Outcome: Progressing   Problem: Clinical Measurements: Goal: Ability to maintain clinical measurements within normal limits will improve Outcome: Progressing Goal: Will remain free from infection Outcome: Progressing Goal: Diagnostic test results will improve Outcome: Progressing Goal: Respiratory complications will improve Outcome: Progressing Goal: Cardiovascular complication will be avoided Outcome: Progressing   Problem: Activity: Goal: Risk for activity intolerance will decrease Outcome: Progressing   Problem: Nutrition: Goal: Adequate nutrition will be maintained Outcome: Progressing   Problem: Coping: Goal: Level of anxiety will decrease Outcome: Progressing   Problem: Elimination: Goal: Will not experience complications related to bowel motility Outcome: Progressing Goal: Will not experience complications related to urinary retention Outcome: Progressing   Problem: Pain Managment: Goal: General experience of comfort will improve and/or be controlled Outcome: Progressing   Problem: Safety: Goal: Ability to remain free from injury will improve Outcome: Progressing   Problem: Skin Integrity: Goal: Risk for impaired skin integrity will decrease Outcome: Progressing   Problem: Education: Goal: Ability to describe self-care measures that may prevent or decrease complications (Diabetes Survival Skills Education) will improve Outcome: Progressing Goal: Individualized Educational Video(s) Outcome: Progressing   Problem: Coping: Goal: Ability to adjust to condition or change in health will improve Outcome: Progressing   Problem: Fluid  Volume: Goal: Ability to maintain a balanced intake and output will improve Outcome: Progressing   Problem: Health Behavior/Discharge Planning: Goal: Ability to identify and utilize available resources and services will improve Outcome: Progressing Goal: Ability to manage health-related needs will improve Outcome: Progressing   Problem: Metabolic: Goal: Ability to maintain appropriate glucose levels will improve Outcome: Progressing   Problem: Nutritional: Goal: Maintenance of adequate nutrition will improve Outcome: Progressing Goal: Progress toward achieving an optimal weight will improve Outcome: Progressing   Problem: Skin Integrity: Goal: Risk for impaired skin integrity will decrease Outcome: Progressing   Problem: Tissue Perfusion: Goal: Adequacy of tissue perfusion will improve Outcome: Progressing   Problem: Safety: Goal: Non-violent Restraint(s) Outcome: Progressing   Problem: Activity: Goal: Ability to tolerate increased activity will improve Outcome: Progressing   Problem: Respiratory: Goal: Ability to maintain a clear airway and adequate ventilation will improve Outcome: Progressing   Problem: Role Relationship: Goal: Method of communication will improve Outcome: Progressing

## 2023-08-19 NOTE — Procedures (Signed)
 Arterial Catheter Insertion Procedure Note  STEVIE ERTLE  [id number]  [birth year]  Date:08/19/23  Time:2:06 PM    Provider Performing: Xyjapa Jadelin Eng    Procedure: Insertion of Arterial Line (63379) with US  guidance (23062)   Indication(s) Blood pressure monitoring and/or need for frequent ABGs  Consent Risks of the procedure as well as the alternatives and risks of each were explained to the patient and/or caregiver.  Consent for the procedure was obtained and is signed in the bedside chart  Anesthesia None   Time Out Verified patient identification, verified procedure, site/side was marked, verified correct patient position, special equipment/implants available, medications/allergies/relevant history reviewed, required imaging and test results available.   Sterile Technique Maximal sterile technique including full sterile barrier drape, hand hygiene, sterile gown, sterile gloves, mask, hair covering, sterile ultrasound probe cover (if used).   Procedure Description Area of catheter insertion was cleaned with chlorhexidine  and draped in sterile fashion. With real-time ultrasound guidance an arterial catheter was placed into the left radial artery.  Appropriate arterial tracings confirmed on monitor.     Complications/Tolerance None; patient tolerated the procedure well.   EBL Minimal   Specimen(s) None  [name], MD [facility] 08/19/2023 2:06 PM

## 2023-08-20 ENCOUNTER — Other Ambulatory Visit: Payer: 59

## 2023-08-20 DIAGNOSIS — J9601 Acute respiratory failure with hypoxia: Secondary | ICD-10-CM | POA: Diagnosis not present

## 2023-08-20 DIAGNOSIS — J449 Chronic obstructive pulmonary disease, unspecified: Secondary | ICD-10-CM | POA: Diagnosis not present

## 2023-08-20 DIAGNOSIS — J09X2 Influenza due to identified novel influenza A virus with other respiratory manifestations: Secondary | ICD-10-CM

## 2023-08-20 DIAGNOSIS — I4891 Unspecified atrial fibrillation: Secondary | ICD-10-CM | POA: Diagnosis not present

## 2023-08-20 LAB — BASIC METABOLIC PANEL
Anion gap: 14 (ref 5–15)
Anion gap: 10 (ref 5–15)
BUN: 35 mg/dL — ABNORMAL HIGH (ref 8–23)
BUN: 32 mg/dL — ABNORMAL HIGH (ref 8–23)
CO2: 21 mmol/L — ABNORMAL LOW (ref 22–32)
CO2: 23 mmol/L (ref 22–32)
Calcium: 8.7 mg/dL — ABNORMAL LOW (ref 8.9–10.3)
Calcium: 8.4 mg/dL — ABNORMAL LOW (ref 8.9–10.3)
Chloride: 97 mmol/L — ABNORMAL LOW (ref 98–111)
Chloride: 101 mmol/L (ref 98–111)
Creatinine, Ser: 0.92 mg/dL (ref 0.44–1.00)
Creatinine, Ser: 0.92 mg/dL (ref 0.44–1.00)
GFR, Estimated: >60 mL/min (ref >60)
GFR, Estimated: >60 mL/min (ref >60)
Glucose, Bld: 175 mg/dL — ABNORMAL HIGH (ref 70–99)
Glucose, Bld: 200 mg/dL — ABNORMAL HIGH (ref 70–99)
Potassium: 3 mmol/L — ABNORMAL LOW (ref 3.5–5.1)
Potassium: 2.9 mmol/L — ABNORMAL LOW (ref 3.5–5.1)
Sodium: 132 mmol/L — ABNORMAL LOW (ref 135–145)
Sodium: 134 mmol/L — ABNORMAL LOW (ref 135–145)

## 2023-08-20 LAB — CBC
HCT: 30.9 % — ABNORMAL LOW (ref 36.0–46.0)
Hemoglobin: 11.2 g/dL — ABNORMAL LOW (ref 12.0–15.0)
MCH: 31.1 pg (ref 26.0–34.0)
MCHC: 36.2 g/dL — ABNORMAL HIGH (ref 30.0–36.0)
MCV: 85.8 fL (ref 80.0–100.0)
Platelets: 178 10*3/uL (ref 150–400)
RBC: 3.6 MIL/uL — ABNORMAL LOW (ref 3.87–5.11)
RDW: 12.1 % (ref 11.5–15.5)
WBC: 9.7 10*3/uL (ref 4.0–10.5)
nRBC: 0.3 % — ABNORMAL HIGH (ref 0.0–0.2)

## 2023-08-20 LAB — GLUCOSE, CAPILLARY
Glucose-Capillary: 160 mg/dL — ABNORMAL HIGH (ref 70–99)
Glucose-Capillary: 186 mg/dL — ABNORMAL HIGH (ref 70–99)
Glucose-Capillary: 170 mg/dL — ABNORMAL HIGH (ref 70–99)
Glucose-Capillary: 195 mg/dL — ABNORMAL HIGH (ref 70–99)
Glucose-Capillary: 185 mg/dL — ABNORMAL HIGH (ref 70–99)
Glucose-Capillary: 179 mg/dL — ABNORMAL HIGH (ref 70–99)

## 2023-08-20 LAB — PHOSPHORUS: Phosphorus: 1.8 mg/dL — ABNORMAL LOW (ref 2.5–4.6)

## 2023-08-20 LAB — HEPARIN LEVEL (UNFRACTIONATED)
Heparin Unfractionated: 0.18 [IU]/mL — ABNORMAL LOW (ref 0.30–0.70)
Heparin Unfractionated: 0.25 [IU]/mL — ABNORMAL LOW (ref 0.30–0.70)
Heparin Unfractionated: 0.38 [IU]/mL (ref 0.30–0.70)
Heparin Unfractionated: 0.41 [IU]/mL (ref 0.30–0.70)

## 2023-08-20 LAB — MAGNESIUM: Magnesium: 2.9 mg/dL — ABNORMAL HIGH (ref 1.7–2.4)

## 2023-08-20 LAB — TRIGLYCERIDES: Triglycerides: 299 mg/dL — ABNORMAL HIGH (ref <150)

## 2023-08-20 LAB — MRSA NEXT GEN BY PCR, NASAL: MRSA by PCR Next Gen: DETECTED — AB

## 2023-08-20 MED ORDER — POLYETHYLENE GLYCOL 3350 17 G PO PACK: 17.0000 g | PACK | Freq: Every day | ORAL | Status: DC

## 2023-08-20 MED ORDER — HYDROMORPHONE HCL 1 MG/ML IJ SOLN: 1.0000 mg | Freq: Once | INTRAMUSCULAR | Status: DC

## 2023-08-20 MED ORDER — POTASSIUM CHLORIDE 20 MEQ PO PACK
40.0000 meq | PACK | ORAL | Status: AC
  Administered 2023-08-20 (×3): 40 meq
  Filled 2023-08-20 (×3): qty 2

## 2023-08-20 MED ORDER — VITAL HIGH PROTEIN PO LIQD
1000.0000 mL | ORAL | Status: DC
  Administered 2023-08-20 – 2023-08-23 (×4): 1000 mL

## 2023-08-20 MED ORDER — HYDROCORTISONE SOD SUC (PF) 100 MG IJ SOLR
100.0000 mg | Freq: Two times a day (BID) | INTRAMUSCULAR | Status: DC
  Administered 2023-08-20 – 2023-08-24 (×9): 100 mg via INTRAVENOUS
  Filled 2023-08-20 (×9): qty 2

## 2023-08-20 MED ORDER — HYDROMORPHONE BOLUS VIA INFUSION
0.2500 mg | INTRAVENOUS | Status: DC | PRN
  Administered 2023-08-20 – 2023-08-22 (×4): 1 mg via INTRAVENOUS
  Administered 2023-08-24 (×2): 2 mg via INTRAVENOUS

## 2023-08-20 MED ORDER — HEPARIN BOLUS VIA INFUSION
2300.0000 [IU] | Freq: Once | INTRAVENOUS | Status: AC
  Administered 2023-08-20: 2300 [IU] via INTRAVENOUS
  Filled 2023-08-20: qty 2300

## 2023-08-20 MED ORDER — OSELTAMIVIR PHOSPHATE 75 MG PO CAPS
75.0000 mg | ORAL_CAPSULE | Freq: Two times a day (BID) | ORAL | Status: DC
  Administered 2023-08-20 – 2023-08-24 (×9): 75 mg
  Filled 2023-08-20 (×9): qty 1

## 2023-08-20 MED ORDER — DIAZEPAM 5 MG/ML IJ SOLN
7.5000 mg | Freq: Two times a day (BID) | INTRAMUSCULAR | Status: DC
  Administered 2023-08-20 – 2023-08-21 (×2): 7.5 mg via INTRAVENOUS
  Filled 2023-08-20 (×2): qty 2

## 2023-08-20 MED ORDER — SODIUM PHOSPHATES 45 MMOLE/15ML IV SOLN
30.0000 mmol | Freq: Once | INTRAVENOUS | Status: AC
  Administered 2023-08-20: 30 mmol via INTRAVENOUS
  Filled 2023-08-20: qty 10

## 2023-08-20 MED ORDER — ADULT MULTIVITAMIN W/MINERALS CH
1.0000 | ORAL_TABLET | Freq: Every day | ORAL | Status: DC
  Administered 2023-08-21 – 2023-08-24 (×4): 1
  Filled 2023-08-20 (×3): qty 1

## 2023-08-20 MED ORDER — PROSOURCE TF20 ENFIT COMPATIBL EN LIQD
60.0000 mL | Freq: Every day | ENTERAL | Status: DC
  Administered 2023-08-21 – 2023-08-24 (×4): 60 mL
  Filled 2023-08-20 (×4): qty 60

## 2023-08-20 MED ORDER — POTASSIUM CHLORIDE 20 MEQ PO PACK
40.0000 meq | PACK | ORAL | Status: AC
  Administered 2023-08-20 (×2): 40 meq
  Filled 2023-08-20 (×2): qty 2

## 2023-08-20 MED ORDER — VANCOMYCIN HCL 1500 MG/300ML IV SOLN
1500.0000 mg | INTRAVENOUS | Status: DC
Start: 2023-08-21 — End: 2023-08-23
  Administered 2023-08-21 – 2023-08-22 (×2): 1500 mg via INTRAVENOUS
  Filled 2023-08-20 (×3): qty 300

## 2023-08-20 MED ORDER — HEPARIN BOLUS VIA INFUSION
1200.0000 [IU] | Freq: Once | INTRAVENOUS | Status: AC
  Administered 2023-08-20: 1200 [IU] via INTRAVENOUS
  Filled 2023-08-20: qty 1200

## 2023-08-20 MED ORDER — POTASSIUM CHLORIDE 10 MEQ/50ML IV SOLN
10.0000 meq | INTRAVENOUS | Status: AC
  Administered 2023-08-20 (×4): 10 meq via INTRAVENOUS
  Filled 2023-08-20 (×4): qty 50

## 2023-08-20 MED ORDER — HYDROMORPHONE HCL-NACL 50-0.9 MG/50ML-% IV SOLN
0.5000 mg/h | INTRAVENOUS | Status: DC
  Administered 2023-08-20 – 2023-08-21 (×2): 1 mg/h via INTRAVENOUS
  Administered 2023-08-23 (×2): 1.5 mg/h via INTRAVENOUS
  Filled 2023-08-20 (×5): qty 50

## 2023-08-20 MED ORDER — FREE WATER
30.0000 mL | Status: DC
Start: 2023-08-20 — End: 2023-08-24
  Administered 2023-08-20 – 2023-08-24 (×23): 30 mL

## 2023-08-20 MED ORDER — DOCUSATE SODIUM 50 MG/5ML PO LIQD: 100.0000 mg | Freq: Two times a day (BID) | ORAL | Status: DC

## 2023-08-20 MED ORDER — HALOPERIDOL LACTATE 5 MG/ML IJ SOLN: 5.0000 mg | Freq: Four times a day (QID) | INTRAMUSCULAR | Status: DC | PRN

## 2023-08-20 MED ORDER — CHLORHEXIDINE GLUCONATE CLOTH 2 % EX PADS
6.0000 | MEDICATED_PAD | Freq: Every day | CUTANEOUS | Status: DC
  Administered 2023-08-20: 6 via TOPICAL

## 2023-08-20 MED ORDER — MUPIROCIN 2 % EX OINT
1.0000 | TOPICAL_OINTMENT | Freq: Two times a day (BID) | CUTANEOUS | Status: DC
  Administered 2023-08-20 – 2023-08-24 (×8): 1 via NASAL
  Filled 2023-08-20: qty 22

## 2023-08-20 NOTE — Progress Notes (Signed)
   08/20/23 1045  Spiritual Encounters  Type of Visit Follow up  Care provided to: Patient  Referral source Chaplain assessment  Reason for visit Routine spiritual support  OnCall Visit No  Spiritual Framework  Presenting Themes Other (comment) (Pt unable to respond)  Interventions  Spiritual Care Interventions Made Other (comment) (Compassionate presence)  Intervention Outcomes  Outcomes Other (comment) (Pt unable to respond)

## 2023-08-20 NOTE — Progress Notes (Addendum)
 Initial Nutrition Assessment  DOCUMENTATION CODES:   Not applicable  INTERVENTION:   Vital HP @50ml /hr- Initiate at 30ml/hr, if tolerating, increase by 10ml/hr q 12 hours until goal rate is reached.   ProSource TF 20- Give 60ml daily via tube, each supplement provides 80kcal and 20g of protein.   Free water  flushes 30ml q4 hours to maintain tube patency   Propofol : 24.3 ml/hr- provides 642kcal/day  Regimen provides 1922kcal/day, 125g/day protein and 1162ml/day of free water .   MVI daily via tube   Pt at high refeed risk; recommend monitor potassium, magnesium  and phosphorus labs daily until stable  Daily weights   NUTRITION DIAGNOSIS:   Inadequate oral intake related to inability to eat (pt sedated and ventilated) as evidenced by NPO status.  GOAL:   Provide needs based on ASPEN/SCCM guidelines  MONITOR:   Vent status, Weight trends, Labs, TF tolerance, I & O's, Skin  REASON FOR ASSESSMENT:   Ventilator    ASSESSMENT:   63 y/o female with h/o EDP, IBS-C, bipolar, CAD, chronic cystitis, anxiety, MDD, GERD, gout, HLD, etoh abuse, NAFLD, chronic pain, COPD, RA and substance abuse who is admitted with flu A, pna and new Afib.  Pt sedated and ventilated. OGT in place. KUB reporting dilated loops of small and large bowel, suggestive of ileus. Will plan to initiate at trickle rate and advance if tolerating. Pt does have a h/o IBS-C. Last BM noted 2/5. Pt is refeeding;electrolytes being monitored and supplemented. Per chart, pt appears weight stable pta.    Medications reviewed and include: allopurinol , aspirin , colace, solu-cortef , hydromorphone , insulin , zofran , protonix , miralax , Kcl, azithromycin , heparin , hydromorphone , zosyn , propofol , Na phosphate, vancomycin    Labs reviewed: Na 132(L), K 3.0(L), BUN 35(H), P 1.8(L), Mg 2.9(H) BNP- 546.7(H) Cbgs- 170, 186, 160 x 24 hrs  AIC 5.5- 2/6  Patient is currently intubated on ventilator support MV: 10.5 L/min Temp  (24hrs), Avg:99 F (37.2 C), Min:96.3 F (35.7 C), Max:101.1 F (38.4 C)  Propofol : 24.3 ml/hr- provides 642kcal/day  MAP >74mmHg   UOP-   NUTRITION - FOCUSED PHYSICAL EXAM:  Flowsheet Row Most Recent Value  Orbital Region No depletion  Upper Arm Region No depletion  Thoracic and Lumbar Region No depletion  Buccal Region No depletion  Temple Region No depletion  Clavicle Bone Region No depletion  Clavicle and Acromion Bone Region No depletion  Scapular Bone Region No depletion  Dorsal Hand No depletion  Patellar Region No depletion  Anterior Thigh Region No depletion  Posterior Calf Region No depletion  Edema (RD Assessment) None  Hair Reviewed  Eyes Reviewed  Mouth Reviewed  Skin Reviewed  Nails Reviewed   Diet Order:   Diet Order             Diet NPO time specified  Diet effective now                  EDUCATION NEEDS:   No education needs have been identified at this time  Skin:  Skin Assessment: Reviewed RN Assessment  Last BM:  2/5- type 6  Height:   Ht Readings from Last 1 Encounters:  08/18/23 5' 6 (1.676 m)    Weight:   Wt Readings from Last 1 Encounters:  08/18/23 82.1 kg    Ideal Body Weight:  59 kg  BMI:  Body mass index is 29.21 kg/m.  Estimated Nutritional Needs:   Kcal:  1867kcal/day  Protein:  110-125g/day  Fluid:  1.8-2.1L/day  Augustin Shams MS, RD, LDN If  unable to be reached, please send secure chat to RD inpatient available from 8:00a-4:00p daily

## 2023-08-20 NOTE — Plan of Care (Signed)
  Problem: Education: Goal: Knowledge of General Education information will improve Description: Including pain rating scale, medication(s)/side effects and non-pharmacologic comfort measures Outcome: Progressing   Problem: Health Behavior/Discharge Planning: Goal: Ability to manage health-related needs will improve Outcome: Progressing   Problem: Clinical Measurements: Goal: Ability to maintain clinical measurements within normal limits will improve Outcome: Progressing Goal: Will remain free from infection Outcome: Progressing Goal: Diagnostic test results will improve Outcome: Progressing Goal: Respiratory complications will improve Outcome: Progressing Goal: Cardiovascular complication will be avoided Outcome: Progressing   Problem: Activity: Goal: Risk for activity intolerance will decrease Outcome: Progressing   Problem: Nutrition: Goal: Adequate nutrition will be maintained Outcome: Progressing   Problem: Coping: Goal: Level of anxiety will decrease Outcome: Progressing   Problem: Elimination: Goal: Will not experience complications related to bowel motility Outcome: Progressing Goal: Will not experience complications related to urinary retention Outcome: Progressing   Problem: Pain Managment: Goal: General experience of comfort will improve and/or be controlled Outcome: Progressing   Problem: Safety: Goal: Ability to remain free from injury will improve Outcome: Progressing   Problem: Skin Integrity: Goal: Risk for impaired skin integrity will decrease Outcome: Progressing   Problem: Education: Goal: Ability to describe self-care measures that may prevent or decrease complications (Diabetes Survival Skills Education) will improve Outcome: Progressing Goal: Individualized Educational Video(s) Outcome: Progressing   Problem: Coping: Goal: Ability to adjust to condition or change in health will improve Outcome: Progressing   Problem: Fluid  Volume: Goal: Ability to maintain a balanced intake and output will improve Outcome: Progressing   Problem: Health Behavior/Discharge Planning: Goal: Ability to identify and utilize available resources and services will improve Outcome: Progressing Goal: Ability to manage health-related needs will improve Outcome: Progressing   Problem: Metabolic: Goal: Ability to maintain appropriate glucose levels will improve Outcome: Progressing   Problem: Nutritional: Goal: Maintenance of adequate nutrition will improve Outcome: Progressing Goal: Progress toward achieving an optimal weight will improve Outcome: Progressing   Problem: Skin Integrity: Goal: Risk for impaired skin integrity will decrease Outcome: Progressing   Problem: Tissue Perfusion: Goal: Adequacy of tissue perfusion will improve Outcome: Progressing   Problem: Safety: Goal: Non-violent Restraint(s) Outcome: Progressing   Problem: Activity: Goal: Ability to tolerate increased activity will improve Outcome: Progressing   Problem: Respiratory: Goal: Ability to maintain a clear airway and adequate ventilation will improve Outcome: Progressing   Problem: Role Relationship: Goal: Method of communication will improve Outcome: Progressing
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
## 2023-08-20 NOTE — TOC CM/SW Note (Addendum)
 Transition of Care [facility]) - Inpatient Brief Assessment   Patient Details  Name: Kaylee [name] MRN: [id number] Date of Birth: December 16, 1960  [facility]) CM/SW Contact:    Lauraine JAYSON Carpen, LCSW Phone Number: 08/20/2023, 3:11 PM   Clinical Narrative: CSW reviewed chart. Patient is currently intubated. Will need readmission prevention screen when appropriate. SDOH flag for transportation. Resources added to AVS.  Transition of Care Asessment: Insurance and Status: Insurance coverage has been reviewed Patient has primary care physician: Yes Home environment has been reviewed: Single family home Prior level of function:: Not documented Prior/Current Home Services: No current home services Social Drivers of Health Review: SDOH reviewed interventions complete Readmission risk has been reviewed: Yes Transition of care needs: transition of care needs identified, TOC will continue to follow
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
## 2023-08-20 NOTE — Discharge Instructions (Signed)
 Transportation Resources  Agency Name: [facility] Agency Address: [address] [place], [place] [address] Phone: [phone] Email: [email or web address] Website: [email or web address] Service(s) Offered: Housing services, self-sufficiency, congregate meal program, weatherization program, Field seismologist program, emergency food assistance,  housing counseling, home ownership program, wheels-towork program.  Agency Name: [facility] [employer] [phone]) Address: [address], [place], [place] [address] Phone: [phone] Website: [email or web address] Service(s) Offered: Transportation for [employer], subscription and demand response; Dial-a-Ride for citizens 49 years of age or older.  Agency Name: Department of Social Services Address: [address] [place], [place] [address] Phone: [phone] Service(s) Offered: Child support services; child welfare services; food stamps; Medicaid; work first family assistance; and aid with fuel,  rent, food and medicine, transportation assistance.  Agency Name: Disabled [employer] (DAV) Transportation  Network Phone: [phone] Service(s) Offered: Transports veterans to the [facility] medical center. Call  forty-eight hours in advance and leave the name, telephone  number, date, and time of appointment. Veteran will be  contacted by the driver the day before the appointment to  arrange a pick up point   Transportation Resources  Agency Name: [facility] Agency Address: [address] [place], [place] [address] Phone: [phone] Email: [email or web address] Website: [email or web address] Service(s) Offered: Housing services, self-sufficiency, congregate meal program, weatherization program, Field seismologist program, emergency food assistance,  housing counseling, home ownership program, wheels-towork  program.  Agency Name: [facility] [employer] [phone]) Address: [address], [place], [place] [address] Phone: [phone] Website: [email or web address] Service(s) Offered: Transportation for [employer], subscription and demand response; Dial-a-Ride for citizens 26 years of age or older.  Agency Name: Department of Social Services Address: [address] [place], [place] [address] Phone: [phone] Service(s) Offered: Child support services; child welfare services; food stamps; Medicaid; work first family assistance; and aid with fuel,  rent, food and medicine, transportation assistance.  Agency Name: Disabled [employer] (DAV) Transportation  Network Phone: [phone] Service(s) Offered: Transports veterans to the [facility] medical center. Call  forty-eight hours in advance and leave the name, telephone  number, date, and time of appointment. Veteran will be  contacted by the driver the day before the appointment to  arrange a pick up point    [employer] currently provides door to door services. ACTA connects with PART daily for services to [facility]. ACTA also performs contract services to [employer] operates 27 vehicles, all but 3 mini-vans are equipped with lifts for special needs as well as the general public. [employer] drivers are each CDL certified and trained in First Aid and CPR. [employer] was established in 2002 by [employer]. An independent Industrial/product designer. [employer] operates via Cytogeneticist with required Research scientist (physical sciences) from [place]. [employer] provides over 80,000 passenger trips each year, including [facility] and [employer] sites.  Call at least by 11 AM one business day prior to needing transportation  [employer].                      [place], [place] [address]     Office  Hours: Monday-Friday  8 AM - 5 PM
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
## 2023-08-20 NOTE — Plan of Care (Signed)
  Problem: Clinical Measurements: Goal: Ability to maintain clinical measurements within normal limits will improve Outcome: Progressing Goal: Will remain free from infection Outcome: Progressing Goal: Diagnostic test results will improve Outcome: Progressing   Problem: Nutrition: Goal: Adequate nutrition will be maintained Outcome: Progressing   Problem: Coping: Goal: Level of anxiety will decrease Outcome: Progressing   Problem: Pain Managment: Goal: General experience of comfort will improve and/or be controlled Outcome: Progressing   Problem: Safety: Goal: Ability to remain free from injury will improve Outcome: Progressing   Problem: Skin Integrity: Goal: Risk for impaired skin integrity will decrease Outcome: Progressing   Problem: Fluid Volume: Goal: Ability to maintain a balanced intake and output will improve Outcome: Progressing   Problem: Metabolic: Goal: Ability to maintain appropriate glucose levels will improve Outcome: Progressing
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
## 2023-08-20 NOTE — Progress Notes (Signed)
 Progress Note  Patient Name: Kaylee [name] Date of Encounter: 08/20/2023  Primary Cardiologist: Perla  Subjective   Transferred to the ICU on 2/6 with progressive respiratory distress requiring BiPAP in the setting of influenza A and multilobar pneumonia.  Intermittently removing BiPAP.  Subsequently intubated on 2/6.  Converted to sinus rhythm on 2/5 around 11:15, maintaining sinus rhythm.   Inpatient Medications    Scheduled Meds:  allopurinol   100 mg Per Tube Daily   ARIPiprazole   5 mg Per Tube Daily   aspirin   81 mg Per Tube Daily   Chlorhexidine  Gluconate Cloth  6 each Topical Daily   diazepam   5 mg Intravenous Q12H   docusate  100 mg Per Tube BID   guaiFENesin   300 mg Per Tube Q6H   haloperidol  lactate  2 mg Intravenous STAT   hydrocortisone  sod succinate (SOLU-CORTEF ) inj  100 mg Intravenous Q12H   insulin  aspart  0-9 Units Subcutaneous Q4H   ipratropium-albuterol   3 mL Nebulization Q6H   ondansetron  (ZOFRAN ) IV  4 mg Intravenous Once   oseltamivir   75 mg Per Tube BID   pantoprazole  (PROTONIX ) IV  40 mg Intravenous Q24H   polyethylene glycol  17 g Per Tube Daily   potassium chloride   40 mEq Per Tube Q4H   Continuous Infusions:  sodium chloride      amiodarone  30 mg/hr (08/20/23 0806)   azithromycin  (ZITHROMAX ) 500 mg in sodium chloride  0.9 % 250 mL IVPB Stopped (08/19/23 1231)   diltiazem  (CARDIZEM ) infusion Stopped (08/19/23 0711)   heparin  1,750 Units/hr (08/20/23 0101)   norepinephrine  (LEVOPHED ) Adult infusion 3 mcg/min (08/20/23 0012)   piperacillin -tazobactam (ZOSYN )  IV 3.375 g (08/20/23 0500)   propofol  (DIPRIVAN ) infusion 50 mcg/kg/min (08/20/23 0614)   sodium PHOSPHATE  IVPB (in mmol)     vancomycin      PRN Meds: acetaminophen , albuterol , fentaNYL  (SUBLIMAZE ) injection, fentaNYL  (SUBLIMAZE ) injection, sodium chloride    Vital Signs    Vitals:   08/20/23 0300 08/20/23 0400 08/20/23 0500 08/20/23 0600  BP: (!) 92/58 96/61 (!) 97/57 114/72  Pulse:  76 76 81 84  Resp:      Temp: 99 F (37.2 C) 98.4 F (36.9 C) 98.6 F (37 C) 99.1 F (37.3 C)  TempSrc:  Esophageal    SpO2: 95% 96% 95% 94%  Weight:      Height:        Intake/Output Summary (Last 24 hours) at 08/20/2023 0810 Last data filed at 08/20/2023 0400 Gross per 24 hour  Intake 2059.24 ml  Output 2550 ml  Net -490.76 ml   Filed Weights   08/15/23 2344 08/18/23 0207  Weight: 77.1 kg 82.1 kg    Telemetry    Converted to sinus rhythm around 11:15 on 2/6 without significant post termination pause, maintaining sinus rhythm - Personally Reviewed  ECG    No new tracings - Personally Reviewed  Physical Exam   GEN: No acute distress. Ill appearing.  Neck: JVD difficult to assess secondary to respiratory support apparatus. Cardiac: RRR, no murmurs, rubs, or gallops.  Respiratory: Vented breath sounds.  Coarse and diminished breath sounds bilaterally with scattered rhonchi.   GI: Soft, nontender, non-distended.   MS: No edema; No deformity. Neuro: Intubated and sedated.  Psych: Intubated and sedated.  Labs    Chemistry Recent Labs  Lab 08/16/23 0748 08/17/23 0606 08/18/23 0611 08/19/23 1526 08/20/23 0403  NA 135 125* 129* 129* 132*  K 3.1* 3.0* 4.2 3.8 3.0*  CL 97* 94* 97* 94* 97*  CO2 26 18* 22 20* 21*  GLUCOSE 134* 100* 119* 177* 175*  BUN 18 19 26* 44* 35*  CREATININE 0.86 1.11* 1.11* 1.04* 0.92  CALCIUM  8.3* 7.6* 8.3* 8.8* 8.7*  PROT 6.8 6.1*  --  5.8*  --   ALBUMIN  3.8 3.0*  --  2.4*  --   AST 33 98*  --  46*  --   ALT 21 32  --  34  --   ALKPHOS 64 37*  --  33*  --   BILITOT 0.7 1.0  --  0.9  --   GFRNONAA >60 56* 56* >60 >60  ANIONGAP 12 13 10 15 14      Hematology Recent Labs  Lab 08/18/23 0611 08/19/23 1526 08/20/23 0403  WBC 3.8* 8.9 9.7  RBC 4.40 3.86* 3.60*  HGB 13.4 11.9* 11.2*  HCT 38.7 34.0* 30.9*  MCV 88.0 88.1 85.8  MCH 30.5 30.8 31.1  MCHC 34.6 35.0 36.2*  RDW 11.9 12.2 12.1  PLT 147* 223 178    Cardiac EnzymesNo  results for input(s): TROPONINI in the last 168 hours. No results for input(s): TROPIPOC in the last 168 hours.   BNP Recent Labs  Lab 08/19/23 1526  BNP 546.7*     DDimer No results for input(s): DDIMER in the last 168 hours.   Radiology    DG Chest Port 1 View Result Date: 08/19/2023 IMPRESSION: 1. Patchy consolidation in the right greater than left mid to lower lung fields and small pleural effusions. Findings consistent with multilobar pneumonia or aspiration. No interval change in the overall aeration. 2. Mild cardiomegaly and central vascular fullness without overt edema. Electronically Signed   By: Francis Quam M.D.   On: 08/19/2023 01:31    Cardiac Studies   2D echo pending  Patient Profile     63 y.o. female with history of bipolar type I disorder, Ekbom's delusional parasitosis, GAD, prior tobacco abuse, COPD, RA, depression, and chronic pain, admitted with influenza A and pneumonia, who is being seen today for the evaluation of aflutter and afib w/ RVR at the request of Dr. Leesa.   Assessment & Plan    1. Aflutter/Afib w/ RVR:  pt admitted w/ Flu A and PNA.  Noted to be in atrial flutter with RVR on the afternoon of 08/18/2023 with ventricular rates up to 193 bpm.  This was followed by slowing and atrial fibrillation with RVR into the 150s to 170s bpm.  Off diltiazem  drip, not receiving metoprolol  with vasopressor support.  Converted to sinus rhythm on 2/6.  Continue IV amiodarone , reduce dose to 30 mg/hr, given high risk for recurrent arrhythmia in the setting of her acute illness.   Continue heparin  drip.  CHA2DS2VASc = 1 (female).  Likely not a great candidate for long-term anticoagulation given comorbid conditions.  Check echo.  TSH is suppressed.  Add on free T4.  High-sensitivity troponin negative.  2.  Acute hypoxic respiratory failure: Transferred to the ICU on 2/6, initially requiring BiPAP with increased work of breathing.  Agitated at times remving BiPAP. Now  intubated.  In the setting of influenza A and multilobar pneumonia.  Ongoing management per CCM.       For questions or updates, please contact [facility] Please consult [email or web address] for contact info under Cardiology/STEMI.    Signed, Bernardino Bring, PA-C [facility] Pager: [phone] 08/20/2023, 8:10 AM

## 2023-08-21 ENCOUNTER — Inpatient Hospital Stay: Payer: 59

## 2023-08-21 ENCOUNTER — Inpatient Hospital Stay (HOSPITAL_COMMUNITY)
Admit: 2023-08-21 | Discharge: 2023-08-21 | Disposition: A | Discharge status: Home / Self Care | Payer: 59 | Attending: Cardiovascular Disease | Admitting: Cardiovascular Disease

## 2023-08-21 DIAGNOSIS — I4891 Unspecified atrial fibrillation: Secondary | ICD-10-CM

## 2023-08-21 DIAGNOSIS — A419 Sepsis, unspecified organism: Secondary | ICD-10-CM

## 2023-08-21 DIAGNOSIS — R9431 Abnormal electrocardiogram [ECG] [EKG]: Secondary | ICD-10-CM

## 2023-08-21 DIAGNOSIS — J869 Pyothorax without fistula: Secondary | ICD-10-CM

## 2023-08-21 DIAGNOSIS — J449 Chronic obstructive pulmonary disease, unspecified: Secondary | ICD-10-CM | POA: Diagnosis not present

## 2023-08-21 DIAGNOSIS — J90 Pleural effusion, not elsewhere classified: Secondary | ICD-10-CM

## 2023-08-21 DIAGNOSIS — J918 Pleural effusion in other conditions classified elsewhere: Secondary | ICD-10-CM

## 2023-08-21 DIAGNOSIS — I4892 Unspecified atrial flutter: Secondary | ICD-10-CM

## 2023-08-21 DIAGNOSIS — J09X2 Influenza due to identified novel influenza A virus with other respiratory manifestations: Secondary | ICD-10-CM | POA: Diagnosis not present

## 2023-08-21 DIAGNOSIS — J9601 Acute respiratory failure with hypoxia: Secondary | ICD-10-CM | POA: Diagnosis not present

## 2023-08-21 DIAGNOSIS — F319 Bipolar disorder, unspecified: Secondary | ICD-10-CM | POA: Diagnosis not present

## 2023-08-21 LAB — BODY FLUID CELL COUNT WITH DIFFERENTIAL
Eos, Fluid: 0 %
Lymphs, Fluid: 4 %
Monocyte-Macrophage-Serous Fluid: 0 %
Neutrophil Count, Fluid: 96 %
Total Nucleated Cell Count, Fluid: 16047 uL

## 2023-08-21 LAB — GLUCOSE, CAPILLARY
Glucose-Capillary: 157 mg/dL — ABNORMAL HIGH (ref 70–99)
Glucose-Capillary: 172 mg/dL — ABNORMAL HIGH (ref 70–99)
Glucose-Capillary: 174 mg/dL — ABNORMAL HIGH (ref 70–99)
Glucose-Capillary: 182 mg/dL — ABNORMAL HIGH (ref 70–99)
Glucose-Capillary: 210 mg/dL — ABNORMAL HIGH (ref 70–99)
Glucose-Capillary: 211 mg/dL — ABNORMAL HIGH (ref 70–99)

## 2023-08-21 LAB — LACTATE DEHYDROGENASE, PLEURAL OR PERITONEAL FLUID: LD, Fluid: >2500 U/L — ABNORMAL HIGH (ref 3–23)

## 2023-08-21 LAB — ECHOCARDIOGRAM COMPLETE
AR max vel: 2.69 cm2
AV Peak grad: 7.1 mm[Hg]
Ao pk vel: 1.33 m/s
Area-P 1/2: 3.72 cm2
Height: 66 in
P 1/2 time: 540 ms
S' Lateral: 3.3 cm
Weight: 2952.4 [oz_av]

## 2023-08-21 LAB — PROTEIN, PLEURAL OR PERITONEAL FLUID: Total protein, fluid: 3.4 g/dL

## 2023-08-21 LAB — CBC
HCT: 29.8 % — ABNORMAL LOW (ref 36.0–46.0)
Hemoglobin: 10.4 g/dL — ABNORMAL LOW (ref 12.0–15.0)
MCH: 31.1 pg (ref 26.0–34.0)
MCHC: 34.9 g/dL (ref 30.0–36.0)
MCV: 89.2 fL (ref 80.0–100.0)
Platelets: 227 10*3/uL (ref 150–400)
RBC: 3.34 MIL/uL — ABNORMAL LOW (ref 3.87–5.11)
RDW: 13 % (ref 11.5–15.5)
WBC: 20.4 10*3/uL — ABNORMAL HIGH (ref 4.0–10.5)
nRBC: 0.2 % (ref 0.0–0.2)

## 2023-08-21 LAB — BASIC METABOLIC PANEL
Anion gap: 8 (ref 5–15)
BUN: 31 mg/dL — ABNORMAL HIGH (ref 8–23)
CO2: 27 mmol/L (ref 22–32)
Calcium: 8.9 mg/dL (ref 8.9–10.3)
Chloride: 101 mmol/L (ref 98–111)
Creatinine, Ser: 0.92 mg/dL (ref 0.44–1.00)
GFR, Estimated: >60 mL/min (ref >60)
Glucose, Bld: 175 mg/dL — ABNORMAL HIGH (ref 70–99)
Potassium: 4.5 mmol/L (ref 3.5–5.1)
Sodium: 136 mmol/L (ref 135–145)

## 2023-08-21 LAB — AMYLASE, PLEURAL OR PERITONEAL FLUID: Amylase, Fluid: 11 U/L

## 2023-08-21 LAB — PHOSPHORUS: Phosphorus: 2.5 mg/dL (ref 2.5–4.6)

## 2023-08-21 LAB — HEPARIN LEVEL (UNFRACTIONATED): Heparin Unfractionated: 0.49 [IU]/mL (ref 0.30–0.70)

## 2023-08-21 LAB — GLUCOSE, PLEURAL OR PERITONEAL FLUID: Glucose, Fluid: <20 mg/dL

## 2023-08-21 LAB — MAGNESIUM: Magnesium: 2.9 mg/dL — ABNORMAL HIGH (ref 1.7–2.4)

## 2023-08-21 MED ORDER — ORAL CARE MOUTH RINSE: 15.0000 mL | OROMUCOSAL | Status: DC | PRN

## 2023-08-21 MED ORDER — ORAL CARE MOUTH RINSE
15.0000 mL | OROMUCOSAL | Status: DC
  Administered 2023-08-21 – 2023-08-24 (×36): 15 mL via OROMUCOSAL

## 2023-08-21 MED ORDER — STERILE WATER FOR INJECTION IJ SOLN
5.0000 mg | Freq: Once | RESPIRATORY_TRACT | Status: DC
  Filled 2023-08-21: qty 5

## 2023-08-21 MED ORDER — SODIUM CHLORIDE (PF) 0.9 % IJ SOLN
5.0000 mg | Freq: Once | INTRAMUSCULAR | Status: AC
  Administered 2023-08-21: 5 mg via INTRAPLEURAL
  Filled 2023-08-21: qty 5

## 2023-08-21 MED ORDER — SODIUM CHLORIDE 0.9% FLUSH
20.0000 mL | Freq: Four times a day (QID) | INTRAVENOUS | Status: DC
  Administered 2023-08-21 – 2023-08-23 (×8): 20 mL via INTRAPLEURAL

## 2023-08-21 NOTE — Plan of Care (Signed)
  Problem: Education: Goal: Knowledge of General Education information will improve Description: Including pain rating scale, medication(s)/side effects and non-pharmacologic comfort measures Outcome: Progressing   Problem: Health Behavior/Discharge Planning: Goal: Ability to manage health-related needs will improve Outcome: Progressing   Problem: Clinical Measurements: Goal: Ability to maintain clinical measurements within normal limits will improve Outcome: Progressing Goal: Will remain free from infection Outcome: Progressing Goal: Diagnostic test results will improve Outcome: Progressing Goal: Respiratory complications will improve Outcome: Progressing Goal: Cardiovascular complication will be avoided Outcome: Progressing   Problem: Activity: Goal: Risk for activity intolerance will decrease Outcome: Progressing   Problem: Nutrition: Goal: Adequate nutrition will be maintained Outcome: Progressing   Problem: Coping: Goal: Level of anxiety will decrease Outcome: Progressing   Problem: Elimination: Goal: Will not experience complications related to bowel motility Outcome: Progressing Goal: Will not experience complications related to urinary retention Outcome: Progressing   Problem: Pain Managment: Goal: General experience of comfort will improve and/or be controlled Outcome: Progressing   Problem: Safety: Goal: Ability to remain free from injury will improve Outcome: Progressing   Problem: Skin Integrity: Goal: Risk for impaired skin integrity will decrease Outcome: Progressing   Problem: Education: Goal: Ability to describe self-care measures that may prevent or decrease complications (Diabetes Survival Skills Education) will improve Outcome: Progressing Goal: Individualized Educational Video(s) Outcome: Progressing   Problem: Coping: Goal: Ability to adjust to condition or change in health will improve Outcome: Progressing   Problem: Fluid  Volume: Goal: Ability to maintain a balanced intake and output will improve Outcome: Progressing   Problem: Health Behavior/Discharge Planning: Goal: Ability to identify and utilize available resources and services will improve Outcome: Progressing Goal: Ability to manage health-related needs will improve Outcome: Progressing   Problem: Metabolic: Goal: Ability to maintain appropriate glucose levels will improve Outcome: Progressing   Problem: Nutritional: Goal: Maintenance of adequate nutrition will improve Outcome: Progressing Goal: Progress toward achieving an optimal weight will improve Outcome: Progressing   Problem: Skin Integrity: Goal: Risk for impaired skin integrity will decrease Outcome: Progressing   Problem: Tissue Perfusion: Goal: Adequacy of tissue perfusion will improve Outcome: Progressing   Problem: Safety: Goal: Non-violent Restraint(s) Outcome: Progressing   Problem: Activity: Goal: Ability to tolerate increased activity will improve Outcome: Progressing   Problem: Respiratory: Goal: Ability to maintain a clear airway and adequate ventilation will improve Outcome: Progressing   Problem: Role Relationship: Goal: Method of communication will improve Outcome: Progressing

## 2023-08-21 NOTE — Procedures (Signed)
 Insertion of Chest Tube Procedure Note  Kaylee [name]  [id number]  1961/02/23  Date:08/21/23  Time:3:20 PM    Provider Performing: Xyjapa Keilyn Haggard   Procedure: Pleural Catheter Insertion w/ Imaging Guidance (67442)  Indication(s) Effusion  Consent Risks of the procedure as well as the alternatives and risks of each were explained to the patient and/or caregiver.  Consent for the procedure was obtained and is signed in the bedside chart  Anesthesia Topical only with 1% lidocaine     Time Out Verified patient identification, verified procedure, site/side was marked, verified correct patient position, special equipment/implants available, medications/allergies/relevant history reviewed, required imaging and test results available.   Sterile Technique Maximal sterile technique including full sterile barrier drape, hand hygiene, sterile gown, sterile gloves, mask, hair covering, sterile ultrasound probe cover (if used).   Procedure Description Ultrasound used to identify appropriate pleural anatomy for placement and overlying skin marked. Area of placement cleaned and draped in sterile fashion.  A 14 French pigtail pleural catheter was placed into the right pleural space using Seldinger technique. Appropriate return of pus was obtained.  The tube was connected to atrium and placed on -20 cm H2O wall suction.    Complications/Tolerance None; patient tolerated the procedure well. Chest X-ray is ordered to verify placement.   EBL Minimal  Specimen(s) fluid  [name], MD [facility] 08/21/2023 3:21 PM

## 2023-08-21 NOTE — Procedures (Signed)
 Pleural Fibrinolytic Administration Procedure Note  Kaylee [name]  [id number]  Sep 08, 1960  Date:08/21/23  Time:7:05 PM   Provider Performing:Keelin Neville   Procedure: Pleural Fibrinolysis Initial day (67438)  Indication(s) Fibrinolysis of complicated pleural effusion  Consent Risks of the procedure as well as the alternatives and risks of each were explained to the patient and/or caregiver.  Consent for the procedure was obtained.   Anesthesia None   Time Out Verified patient identification, verified procedure, site/side was marked, verified correct patient position, special equipment/implants available, medications/allergies/relevant history reviewed, required imaging and test results available.   Sterile Technique Hand hygiene, gloves   Procedure Description Existing pleural catheter was cleaned and accessed in sterile manner.  10mg  of tPA in 30cc of saline and 5mg  of dornase in 30cc of sterile water  were injected into pleural space using existing pleural catheter.  Catheter will be clamped for 1 hour and then placed back to suction.   Complications/Tolerance None; patient tolerated the procedure well.  EBL None   Specimen(s) None  [name], MD [facility] 08/21/2023 7:05 PM

## 2023-08-21 NOTE — Progress Notes (Signed)
 Progress Note  Patient Name: Kaylee [name] Date of Encounter: 08/21/2023  Primary Cardiologist: Perla  Subjective   Transferred to the ICU on 2/6 with progressive respiratory distress requiring BiPAP in the setting of influenza A and multilobar pneumonia.    Subsequently intubated on 2/6.   Converted to sinus rhythm on 2/5 around 11:15 Remains in NSR on Tele  Inpatient Medications    Scheduled Meds:  allopurinol   100 mg Per Tube Daily   aspirin   81 mg Per Tube Daily   Chlorhexidine  Gluconate Cloth  6 each Topical Daily   diazepam   7.5 mg Intravenous Q12H   docusate  100 mg Per Tube BID   feeding supplement (PROSource TF20)  60 mL Per Tube Daily   free water   30 mL Per Tube Q4H   guaiFENesin   300 mg Per Tube Q6H   hydrocortisone  sod succinate (SOLU-CORTEF ) inj  100 mg Intravenous Q12H    HYDROmorphone  (DILAUDID ) injection  1 mg Intravenous Once   insulin  aspart  0-9 Units Subcutaneous Q4H   ipratropium-albuterol   3 mL Nebulization Q6H   multivitamin with minerals  1 tablet Per Tube Daily   mupirocin  ointment  1 Application Nasal BID   ondansetron  (ZOFRAN ) IV  4 mg Intravenous Once   oseltamivir   75 mg Per Tube BID   pantoprazole  (PROTONIX ) IV  40 mg Intravenous Q24H   polyethylene glycol  17 g Per Tube Daily   Continuous Infusions:  amiodarone  30 mg/hr (08/21/23 0938)   azithromycin  (ZITHROMAX ) 500 mg in sodium chloride  0.9 % 250 mL IVPB Stopped (08/20/23 1216)   diltiazem  (CARDIZEM ) infusion Stopped (08/19/23 0711)   feeding supplement (VITAL HIGH PROTEIN) 20 mL/hr at 08/21/23 0938   heparin  Stopped (08/21/23 0858)   HYDROmorphone  1 mg/hr (08/21/23 9061)   norepinephrine  (LEVOPHED ) Adult infusion 4 mcg/min (08/21/23 9061)   piperacillin -tazobactam (ZOSYN )  IV Stopped (08/21/23 0930)   propofol  (DIPRIVAN ) infusion 40 mcg/kg/min (08/21/23 0938)   vancomycin  150 mL/hr at 08/21/23 0938   PRN Meds: acetaminophen , albuterol , fentaNYL  (SUBLIMAZE ) injection, fentaNYL   (SUBLIMAZE ) injection, haloperidol  lactate, HYDROmorphone , sodium chloride    Vital Signs    Vitals:   08/21/23 0730 08/21/23 0800 08/21/23 0900 08/21/23 0930  BP: 105/62 106/67 105/62 109/64  Pulse: 73 73 77 74  Resp: 18 18 18 18   Temp: 100 F (37.8 C) 99.9 F (37.7 C) 99.9 F (37.7 C) 99.7 F (37.6 C)  TempSrc:  Esophageal    SpO2: 92% 94% 92% 93%  Weight:      Height:        Intake/Output Summary (Last 24 hours) at 08/21/2023 1002 Last data filed at 08/21/2023 9061 Gross per 24 hour  Intake 4089.66 ml  Output 1015 ml  Net 3074.66 ml   Filed Weights   08/15/23 2344 08/18/23 0207 08/21/23 0421  Weight: 77.1 kg 82.1 kg 83.7 kg    Telemetry    NSR - Personally Reviewed  ECG    No new tracings - Personally Reviewed  Physical Exam   GEN: sedated and intubated HEENT: Normal NECK: No JVD; No carotid bruits LYMPHATICS: No lymphadenopathy CARDIAC:RRR, no murmurs, rubs, gallops RESPIRATORY:  Clear to auscultation without rales, wheezing or rhonchi  ABDOMEN: Soft, non-tender, non-distended MUSCULOSKELETAL:  No edema; No deformity  SKIN: Warm and dry NEUROLOGIC:  cannot assess PSYCHIATRIC: cannot assess Labs    Chemistry Recent Labs  Lab 08/16/23 9251 08/17/23 0606 08/18/23 9388 08/19/23 1526 08/20/23 0403 08/20/23 1659 08/21/23 0416  NA 135 125*   < >  129* 132* 134* 136  K 3.1* 3.0*   < > 3.8 3.0* 2.9* 4.5  CL 97* 94*   < > 94* 97* 101 101  CO2 26 18*   < > 20* 21* 23 27  GLUCOSE 134* 100*   < > 177* 175* 200* 175*  BUN 18 19   < > 44* 35* 32* 31*  CREATININE 0.86 1.11*   < > 1.04* 0.92 0.92 0.92  CALCIUM  8.3* 7.6*   < > 8.8* 8.7* 8.4* 8.9  PROT 6.8 6.1*  --  5.8*  --   --   --   ALBUMIN  3.8 3.0*  --  2.4*  --   --   --   AST 33 98*  --  46*  --   --   --   ALT 21 32  --  34  --   --   --   ALKPHOS 64 37*  --  33*  --   --   --   BILITOT 0.7 1.0  --  0.9  --   --   --   GFRNONAA >60 56*   < > >60 >60 >60 >60  ANIONGAP 12 13   < > 15 14 10 8    < > =  values in this interval not displayed.     Hematology Recent Labs  Lab 08/19/23 1526 08/20/23 0403 08/21/23 0416  WBC 8.9 9.7 20.4*  RBC 3.86* 3.60* 3.34*  HGB 11.9* 11.2* 10.4*  HCT 34.0* 30.9* 29.8*  MCV 88.1 85.8 89.2  MCH 30.8 31.1 31.1  MCHC 35.0 36.2* 34.9  RDW 12.2 12.1 13.0  PLT 223 178 227    Cardiac EnzymesNo results for input(s): TROPONINI in the last 168 hours. No results for input(s): TROPIPOC in the last 168 hours.   BNP Recent Labs  Lab 08/19/23 1526  BNP 546.7*     DDimer No results for input(s): DDIMER in the last 168 hours.   Radiology    DG Chest Port 1 View Result Date: 08/19/2023 IMPRESSION: 1. Patchy consolidation in the right greater than left mid to lower lung fields and small pleural effusions. Findings consistent with multilobar pneumonia or aspiration. No interval change in the overall aeration. 2. Mild cardiomegaly and central vascular fullness without overt edema. Electronically Signed   By: Francis Quam M.D.   On: 08/19/2023 01:31    Cardiac Studies   2D echo pending  Patient Profile     63 y.o. female with history of bipolar type I disorder, Ekbom's delusional parasitosis, GAD, prior tobacco abuse, COPD, RA, depression, and chronic pain, admitted with influenza A and pneumonia, who is being seen today for the evaluation of aflutter and afib w/ RVR at the request of Dr. Leesa.   Assessment & Plan    1. Aflutter/Afib w/ RVR:  pt admitted w/ Flu A and PNA.  Noted to be in atrial flutter with RVR on the afternoon of 08/18/2023 with ventricular rates up to 193 bpm.  This was followed by slowing and atrial fibrillation with RVR into the 150s to 170s bpm.  Off diltiazem  drip, not receiving metoprolol  with vasopressor support.   -Converted to sinus rhythm on 2/6.   -remains in NSR -continue IV Amio 30mg /hr given her high risk for recurrent atrial arrhythmias in the setting of acute hypoxemic respiratory failure from the flu -[id number] =  1 (female).   -Likely not a great candidate for long-term anticoagulation given comorbid conditions.   -2D echo  pending -TSH is suppressed. T4 pending -High-sensitivity troponin negative.  2.  Acute hypoxic respiratory failure: Transferred to the ICU on 2/6, initially requiring BiPAP with increased work of breathing.  Agitated at times remving BiPAP. Now intubated.  I -n the setting of influenza A and multilobar pneumonia.   -Ongoing management per CCM.  I spent 30 minutes caring for this patient today face to face, ordering and reviewing labs, reviewing records from this hospitalization  seeing the patient, documenting in the record, and arranging for a 2D echo read     For questions or updates, please contact [facility] Please consult [email or web address] for contact info under Cardiology/STEMI.    Signed, Bernardino Bring, PA-C [facility] Pager: [phone] 08/21/2023, 10:02 AM
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
## 2023-08-21 NOTE — Progress Notes (Signed)
  Echocardiogram 2D Echocardiogram has been performed.  Nichola Barges 08/21/2023, 9:49 AM

## 2023-08-22 ENCOUNTER — Inpatient Hospital Stay: Payer: 59

## 2023-08-22 DIAGNOSIS — J9602 Acute respiratory failure with hypercapnia: Secondary | ICD-10-CM

## 2023-08-22 DIAGNOSIS — J869 Pyothorax without fistula: Secondary | ICD-10-CM | POA: Diagnosis not present

## 2023-08-22 DIAGNOSIS — I9589 Other hypotension: Secondary | ICD-10-CM

## 2023-08-22 DIAGNOSIS — J441 Chronic obstructive pulmonary disease with (acute) exacerbation: Secondary | ICD-10-CM

## 2023-08-22 DIAGNOSIS — J9601 Acute respiratory failure with hypoxia: Secondary | ICD-10-CM | POA: Diagnosis not present

## 2023-08-22 DIAGNOSIS — G9341 Metabolic encephalopathy: Secondary | ICD-10-CM

## 2023-08-22 DIAGNOSIS — I351 Nonrheumatic aortic (valve) insufficiency: Secondary | ICD-10-CM | POA: Diagnosis not present

## 2023-08-22 DIAGNOSIS — I4891 Unspecified atrial fibrillation: Secondary | ICD-10-CM | POA: Diagnosis not present

## 2023-08-22 LAB — CBC
HCT: 31.6 % — ABNORMAL LOW (ref 36.0–46.0)
Hemoglobin: 10.5 g/dL — ABNORMAL LOW (ref 12.0–15.0)
MCH: 30.8 pg (ref 26.0–34.0)
MCHC: 33.2 g/dL (ref 30.0–36.0)
MCV: 92.7 fL (ref 80.0–100.0)
Platelets: 232 10*3/uL (ref 150–400)
RBC: 3.41 MIL/uL — ABNORMAL LOW (ref 3.87–5.11)
RDW: 13.8 % (ref 11.5–15.5)
WBC: 16.2 10*3/uL — ABNORMAL HIGH (ref 4.0–10.5)
nRBC: 0.6 % — ABNORMAL HIGH (ref 0.0–0.2)

## 2023-08-22 LAB — CULTURE, BLOOD (ROUTINE X 2)
Culture: NO GROWTH
Culture: NO GROWTH

## 2023-08-22 LAB — CULTURE, RESPIRATORY W GRAM STAIN: Gram Stain: NONE SEEN

## 2023-08-22 LAB — GLUCOSE, CAPILLARY
Glucose-Capillary: 208 mg/dL — ABNORMAL HIGH (ref 70–99)
Glucose-Capillary: 164 mg/dL — ABNORMAL HIGH (ref 70–99)
Glucose-Capillary: 169 mg/dL — ABNORMAL HIGH (ref 70–99)
Glucose-Capillary: 172 mg/dL — ABNORMAL HIGH (ref 70–99)
Glucose-Capillary: 178 mg/dL — ABNORMAL HIGH (ref 70–99)

## 2023-08-22 LAB — MAGNESIUM: Magnesium: 2.5 mg/dL — ABNORMAL HIGH (ref 1.7–2.4)

## 2023-08-22 LAB — BASIC METABOLIC PANEL
Anion gap: 8 (ref 5–15)
BUN: 32 mg/dL — ABNORMAL HIGH (ref 8–23)
CO2: 26 mmol/L (ref 22–32)
Calcium: 9.1 mg/dL (ref 8.9–10.3)
Chloride: 105 mmol/L (ref 98–111)
Creatinine, Ser: 0.7 mg/dL (ref 0.44–1.00)
GFR, Estimated: >60 mL/min (ref >60)
Glucose, Bld: 215 mg/dL — ABNORMAL HIGH (ref 70–99)
Potassium: 5.1 mmol/L (ref 3.5–5.1)
Sodium: 139 mmol/L (ref 135–145)

## 2023-08-22 LAB — LEGIONELLA PNEUMOPHILA SEROGP 1 UR AG: L. pneumophila Serogp 1 Ur Ag: NEGATIVE

## 2023-08-22 LAB — T4, FREE: Free T4: 0.63 ng/dL (ref 0.61–1.12)

## 2023-08-22 LAB — HEPARIN LEVEL (UNFRACTIONATED): Heparin Unfractionated: <0.1 [IU]/mL — ABNORMAL LOW (ref 0.30–0.70)

## 2023-08-22 LAB — PHOSPHORUS: Phosphorus: 1.8 mg/dL — ABNORMAL LOW (ref 2.5–4.6)

## 2023-08-22 MED ORDER — STERILE WATER FOR INJECTION IJ SOLN
5.0000 mg | Freq: Once | RESPIRATORY_TRACT | Status: AC
  Administered 2023-08-22: 5 mg via INTRAPLEURAL
  Filled 2023-08-22: qty 5

## 2023-08-22 MED ORDER — SODIUM CHLORIDE 0.9% FLUSH
10.0000 mL | Freq: Three times a day (TID) | INTRAVENOUS | Status: DC
  Administered 2023-08-22 – 2023-08-23 (×3): 10 mL via INTRAPLEURAL

## 2023-08-22 MED ORDER — MIDAZOLAM HCL 2 MG/2ML IJ SOLN
1.0000 mg | INTRAMUSCULAR | Status: DC | PRN
  Administered 2023-08-22 – 2023-08-24 (×2): 1 mg via INTRAVENOUS
  Filled 2023-08-22 (×2): qty 2

## 2023-08-22 MED ORDER — SODIUM CHLORIDE 0.9% FLUSH: 10.0000 mL | INTRAVENOUS | Status: DC | PRN

## 2023-08-22 MED ORDER — IOHEXOL 300 MG/ML  SOLN
75.0000 mL | Freq: Once | INTRAMUSCULAR | Status: AC | PRN
  Administered 2023-08-22: 75 mL via INTRAVENOUS

## 2023-08-22 MED ORDER — BUDESONIDE 0.5 MG/2ML IN SUSP
0.5000 mg | Freq: Two times a day (BID) | RESPIRATORY_TRACT | Status: DC
  Administered 2023-08-22 – 2023-08-23 (×3): 0.5 mg via RESPIRATORY_TRACT
  Filled 2023-08-22 (×3): qty 2

## 2023-08-22 MED ORDER — REVEFENACIN 175 MCG/3ML IN SOLN
175.0000 ug | Freq: Every day | RESPIRATORY_TRACT | Status: DC
  Administered 2023-08-22 – 2023-08-24 (×3): 175 ug via RESPIRATORY_TRACT
  Filled 2023-08-22 (×3): qty 3

## 2023-08-22 MED ORDER — VECURONIUM BROMIDE 10 MG IV SOLR
10.0000 mg | Freq: Once | INTRAVENOUS | Status: AC
  Administered 2023-08-22: 10 mg via INTRAVENOUS
  Filled 2023-08-22: qty 10

## 2023-08-22 MED ORDER — SODIUM CHLORIDE (PF) 0.9 % IJ SOLN
5.0000 mg | Freq: Once | INTRAMUSCULAR | Status: AC
  Administered 2023-08-22: 5 mg via INTRAPLEURAL
  Filled 2023-08-22: qty 5

## 2023-08-22 MED ORDER — SODIUM PHOSPHATES 45 MMOLE/15ML IV SOLN
30.0000 mmol | Freq: Once | INTRAVENOUS | Status: AC
  Administered 2023-08-22: 30 mmol via INTRAVENOUS
  Filled 2023-08-22: qty 10

## 2023-08-22 MED ORDER — ACETAMINOPHEN 10 MG/ML IV SOLN
1000.0000 mg | Freq: Four times a day (QID) | INTRAVENOUS | Status: AC
  Administered 2023-08-23 (×3): 1000 mg via INTRAVENOUS
  Filled 2023-08-22 (×4): qty 100

## 2023-08-22 MED ORDER — SODIUM CHLORIDE 0.9% FLUSH
10.0000 mL | Freq: Two times a day (BID) | INTRAVENOUS | Status: DC
  Administered 2023-08-22 – 2023-08-24 (×5): 10 mL

## 2023-08-22 NOTE — Procedures (Signed)
 Pleural Fibrinolytic Administration Procedure Note   Provider Performing:K Alm [name]   Procedure: Pleural Fibrinolysis  2nd dose 9AM 08/22/2023    Indication(s) Fibrinolysis of complicated pleural effusion   Consent Risks of the procedure as well as the alternatives and risks of each were explained to the patient and/or caregiver.  Consent for the procedure was obtained 2/8     Anesthesia None     Time Out Verified patient identification, verified procedure, site/side was marked, verified correct patient position, special equipment/implants available, medications/allergies/relevant history reviewed, required imaging and test results available.     Sterile Technique Hand hygiene, gloves     Procedure Description Existing pleural catheter was cleaned and accessed in sterile manner.  5 mg of tPA in 30cc of saline and 5mg  of dornase in 30cc of sterile water  were injected into pleural space using existing pleural catheter.  Catheter will be clamped for 1 hour and then placed back to suction.     Complications/Tolerance None; patient tolerated the procedure well.   EBL None     Specimen(s) None    Kaylee [name], M.D.  [facility]  Medical Director [facility] Medical Director [facility] Cardio-Pulmonary Department

## 2023-08-22 NOTE — Progress Notes (Signed)
 PHARMACY CONSULT NOTE - FOLLOW UP  Pharmacy Consult for Electrolyte Monitoring and Replacement   Recent Labs: Potassium (mmol/L)  Date Value  08/22/2023 5.1  07/01/2014 4.2   Magnesium  (mg/dL)  Date Value  97/90/7974 2.5 (H)  07/01/2014 1.9   Calcium  (mg/dL)  Date Value  97/90/7974 9.1   Calcium , Total (mg/dL)  Date Value  87/79/7984 8.9   Albumin  (g/dL)  Date Value  97/93/7974 2.4 (L)  07/01/2014 3.5   Phosphorus (mg/dL)  Date Value  97/90/7974 1.8 (L)   Sodium (mmol/L)  Date Value  08/22/2023 139  07/01/2014 140     Assessment: 63 y/o female with h/o EDP, IBS-C, bipolar, CAD, chronic cystitis, anxiety, MDD, GERD, gout, HLD, etoh abuse, NAFLD, chronic pain, COPD, RA and substance abuse who is admitted with flu A, pna and new Afib. Pharmacy is asked to follow and replace electrolytes while in CCU  Goal of Therapy:  Electrolytes WNL  Plan:  ---30 mmol iv sodium phosphate  x 1 ---recheck electrolytes in am  Kaylee [name] ,PharmD Clinical Pharmacist 08/22/2023 7:20 AM

## 2023-08-22 NOTE — IPAL (Signed)
  Interdisciplinary Goals of Care Family Meeting   Date carried out: 08/22/2023  Location of the meeting: Phone conference  Member's involved: Physician and Family Member or next of kin  Durable Power of Attorney or acting medical decision maker: Daughter Kaylee [name]    Discussion: We discussed goals of care for Kaylee [name] .  Discussed with [name] her mother's overall medical condition, with respiratory failure, empyema, and pneumonia. Daughter would like to change code status to DNR for the time being, while continuing with rest of medical care.  Code status:   Code Status: DNR  Disposition: Continue current acute care  Time spent for the meeting: 10 min    [name], MD  08/22/2023, 6:09 PM

## 2023-08-22 NOTE — Progress Notes (Signed)
 Per MD re-start heparin  gtt. Unable to wean to RASS goal due to ventilator dyssynchrony with oxygen  desaturation and increased O2 requirements. Vecuronium  order administered. CXR obtained.

## 2023-08-22 NOTE — Plan of Care (Signed)
 Cardiology recommends using alternative NEBS (avoid beta agonists) Plan to start Pulmicort  and Yupelri 

## 2023-08-22 NOTE — Progress Notes (Signed)
 Progress Note  Patient Name: Kaylee [name] Date of Encounter: 08/22/2023  Primary Cardiologist: Perla  Subjective   Transferred to the ICU on 2/6 with progressive respiratory distress requiring BiPAP in the setting of influenza A and multilobar pneumonia.  Subsequently intubated on 2/6.  Converted to sinus rhythm on 2/6 around 11:15.  Remains in NSR on tele.  Remains intubated and sedated.  Echo showed an EF of 55-60%, no RWMA, normal LV diastolic function, normal RVSF with mildly elevated RVSP, moderately dilated left atrium, mild MR, mild to moderate AI, and aortic sclerosis without stenosis. Underwent right chest tube placement on 2/8 secondary to empyema.  Inpatient Medications    Scheduled Meds:  allopurinol   100 mg Per Tube Daily   aspirin   81 mg Per Tube Daily   Chlorhexidine  Gluconate Cloth  6 each Topical Daily   docusate  100 mg Per Tube BID   dornase alfa  (PULMOZYME ) 5 mg in sterile water  (preservative free) 30 mL  5 mg Intrapleural Once   feeding supplement (PROSource TF20)  60 mL Per Tube Daily   free water   30 mL Per Tube Q4H   guaiFENesin   300 mg Per Tube Q6H   hydrocortisone  sod succinate (SOLU-CORTEF ) inj  100 mg Intravenous Q12H    HYDROmorphone  (DILAUDID ) injection  1 mg Intravenous Once   insulin  aspart  0-9 Units Subcutaneous Q4H   ipratropium-albuterol   3 mL Nebulization Q6H   multivitamin with minerals  1 tablet Per Tube Daily   mupirocin  ointment  1 Application Nasal BID   ondansetron  (ZOFRAN ) IV  4 mg Intravenous Once   mouth rinse  15 mL Mouth Rinse Q2H   oseltamivir   75 mg Per Tube BID   pantoprazole  (PROTONIX ) IV  40 mg Intravenous Q24H   polyethylene glycol  17 g Per Tube Daily   sodium chloride  flush  10-40 mL Intracatheter Q12H   sodium chloride  flush  20 mL Intrapleural Q6H   Continuous Infusions:  amiodarone  30 mg/hr (08/22/23 0449)   feeding supplement (VITAL HIGH PROTEIN) 40 mL/hr at 08/22/23 0449   heparin  Stopped (08/21/23 0858)    HYDROmorphone  1 mg/hr (08/22/23 0449)   norepinephrine  (LEVOPHED ) Adult infusion 1 mcg/min (08/22/23 0449)   piperacillin -tazobactam (ZOSYN )  IV 3.375 g (08/22/23 0505)   propofol  (DIPRIVAN ) infusion 40 mcg/kg/min (08/22/23 0449)   vancomycin  Stopped (08/21/23 1128)   PRN Meds: acetaminophen , albuterol , fentaNYL  (SUBLIMAZE ) injection, fentaNYL  (SUBLIMAZE ) injection, haloperidol  lactate, HYDROmorphone , mouth rinse, sodium chloride , sodium chloride  flush   Vital Signs    Vitals:   08/22/23 0300 08/22/23 0400 08/22/23 0500 08/22/23 0600  BP: 119/63 (!) 120/59 121/64 122/63  Pulse: 88 89 87 90  Resp: 20 19 19 19   Temp: 100.2 F (37.9 C) (!) 100.6 F (38.1 C) (!) 100.9 F (38.3 C) (!) 100.9 F (38.3 C)  TempSrc:  Esophageal    SpO2: 92% 92% 90% 92%  Weight:   76.7 kg   Height:        Intake/Output Summary (Last 24 hours) at 08/22/2023 0719 Last data filed at 08/22/2023 9374 Gross per 24 hour  Intake 2631.87 ml  Output 3575 ml  Net -943.13 ml   Filed Weights   08/18/23 0207 08/21/23 0421 08/22/23 0500  Weight: 82.1 kg 83.7 kg 76.7 kg    Telemetry    NSR - Personally Reviewed  ECG    No new tracings - Personally Reviewed  Physical Exam   GEN: Sedated and intubated; mittens in place HEENT: Normal NECK:  JVD difficult to assess secondary to respiratory support apparatus; No carotid bruits LYMPHATICS: No lymphadenopathy CARDIAC:RRR, no murmurs, rubs, gallops RESPIRATORY:  Vented breath sounds with diffuse rhonchi  ABDOMEN: Soft, non-tender, non-distended MUSCULOSKELETAL:  No edema; No deformity  SKIN: Warm and dry NEUROLOGIC:  Cannot assess; intubated and sedated PSYCHIATRIC: Cannot assess; intubated and sedated  Labs    Chemistry Recent Labs  Lab 08/16/23 0748 08/17/23 0606 08/18/23 9388 08/19/23 1526 08/20/23 0403 08/20/23 1659 08/21/23 0416 08/22/23 0419  NA 135 125*   < > 129*   < > 134* 136 139  K 3.1* 3.0*   < > 3.8   < > 2.9* 4.5 5.1  CL 97* 94*   <  > 94*   < > 101 101 105  CO2 26 18*   < > 20*   < > 23 27 26   GLUCOSE 134* 100*   < > 177*   < > 200* 175* 215*  BUN 18 19   < > 44*   < > 32* 31* 32*  CREATININE 0.86 1.11*   < > 1.04*   < > 0.92 0.92 0.70  CALCIUM  8.3* 7.6*   < > 8.8*   < > 8.4* 8.9 9.1  PROT 6.8 6.1*  --  5.8*  --   --   --   --   ALBUMIN  3.8 3.0*  --  2.4*  --   --   --   --   AST 33 98*  --  46*  --   --   --   --   ALT 21 32  --  34  --   --   --   --   ALKPHOS 64 37*  --  33*  --   --   --   --   BILITOT 0.7 1.0  --  0.9  --   --   --   --   GFRNONAA >60 56*   < > >60   < > >60 >60 >60  ANIONGAP 12 13   < > 15   < > 10 8 8    < > = values in this interval not displayed.     Hematology Recent Labs  Lab 08/20/23 0403 08/21/23 0416 08/22/23 0419  WBC 9.7 20.4* 16.2*  RBC 3.60* 3.34* 3.41*  HGB 11.2* 10.4* 10.5*  HCT 30.9* 29.8* 31.6*  MCV 85.8 89.2 92.7  MCH 31.1 31.1 30.8  MCHC 36.2* 34.9 33.2  RDW 12.1 13.0 13.8  PLT 178 227 232    Cardiac EnzymesNo results for input(s): TROPONINI in the last 168 hours. No results for input(s): TROPIPOC in the last 168 hours.   BNP Recent Labs  Lab 08/19/23 1526  BNP 546.7*     DDimer No results for input(s): DDIMER in the last 168 hours.   Radiology    DG Chest Port 1 View Result Date: 08/19/2023 IMPRESSION: 1. Patchy consolidation in the right greater than left mid to lower lung fields and small pleural effusions. Findings consistent with multilobar pneumonia or aspiration. No interval change in the overall aeration. 2. Mild cardiomegaly and central vascular fullness without overt edema. Electronically Signed   By: Francis Quam M.D.   On: 08/19/2023 01:31    Cardiac Studies   2D echo 08/21/2023: 1. Left ventricular ejection fraction, by estimation, is 55 to 60%. The  left ventricle has normal function. The left ventricle has no regional  wall motion abnormalities. Left ventricular diastolic parameters were  normal.   2. Right ventricular systolic  function is normal. The right ventricular  size is normal. There is mildly elevated pulmonary artery systolic  pressure. The estimated right ventricular systolic pressure is 38.4 mmHg.   3. Left atrial size was moderately dilated.   4. Right atrial size was mildly dilated.   5. The mitral valve is normal in structure. Mild mitral valve  regurgitation. No evidence of mitral stenosis.   6. The aortic valve is tricuspid. Aortic valve regurgitation is mild to  moderate. Aortic valve sclerosis/calcification is present, without any  evidence of aortic stenosis. Aortic regurgitation PHT measures 540 msec.  Aortic valve Vmax measures 1.33 m/s.   7. The inferior vena cava is dilated in size with <50% respiratory  variability, suggesting right atrial pressure of 15 mmHg.   Patient Profile     63 y.o. female with history of bipolar type I disorder, Ekbom's delusional parasitosis, GAD, prior tobacco abuse, COPD, RA, depression, and chronic pain, admitted with influenza A and pneumonia, who is being seen today for the evaluation of aflutter and afib w/ RVR at the request of Dr. Leesa.   Assessment & Plan    1. Aflutter/Afib w/ RVR:   -Patient admitted with influenza A and PNA.  Noted to be in atrial flutter with RVR on the afternoon of 08/18/2023 with ventricular rates up to 193 bpm.  This was followed by slowing and atrial fibrillation with RVR into the 150s to 170s bpm.  Off diltiazem  drip, not receiving metoprolol  with vasopressor support.   -Converted to sinus rhythm on 2/6  -Remains in NSR -Continue IV Amio 30mg /hr given her high risk for recurrent atrial arrhythmias in the setting of acute hypoxemic respiratory failure from the flu/PNA -Heparin  held on 2/8 with insertion of chest tube secondary to empyema  -CHA2DS2VASc = 1 (female)  -Likely not a great candidate for long-term anticoagulation given comorbid conditions -2D echo with preserved LVSF -TSH is suppressed. T4 pending -High-sensitivity  troponin negative  2.  Acute hypoxic respiratory failure:  -Transferred to the ICU on 2/6, initially requiring BiPAP with increased work of breathing.  Agitated at times remving BiPAP. Now intubated -In the setting of influenza A and multilobar pneumonia   -Ongoing management per CCM -Diurese as indicated     For questions or updates, please contact [facility] Please consult [email or web address] for contact info under Cardiology/STEMI.    Signed, Bernardino Bring, PA-C [facility] Pager: [phone] 08/22/2023, 7:19 AM
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
## 2023-08-23 ENCOUNTER — Inpatient Hospital Stay: Payer: 59

## 2023-08-23 DIAGNOSIS — R6521 Severe sepsis with septic shock: Secondary | ICD-10-CM

## 2023-08-23 DIAGNOSIS — J869 Pyothorax without fistula: Secondary | ICD-10-CM | POA: Diagnosis not present

## 2023-08-23 DIAGNOSIS — J9601 Acute respiratory failure with hypoxia: Secondary | ICD-10-CM | POA: Diagnosis not present

## 2023-08-23 DIAGNOSIS — J09X1 Influenza due to identified novel influenza A virus with pneumonia: Secondary | ICD-10-CM | POA: Diagnosis not present

## 2023-08-23 DIAGNOSIS — J441 Chronic obstructive pulmonary disease with (acute) exacerbation: Secondary | ICD-10-CM | POA: Diagnosis not present

## 2023-08-23 DIAGNOSIS — I4891 Unspecified atrial fibrillation: Secondary | ICD-10-CM | POA: Diagnosis not present

## 2023-08-23 LAB — TRIGLYCERIDES: Triglycerides: 138 mg/dL (ref <150)

## 2023-08-23 LAB — GLUCOSE, CAPILLARY
Glucose-Capillary: 146 mg/dL — ABNORMAL HIGH (ref 70–99)
Glucose-Capillary: 148 mg/dL — ABNORMAL HIGH (ref 70–99)
Glucose-Capillary: 163 mg/dL — ABNORMAL HIGH (ref 70–99)
Glucose-Capillary: 169 mg/dL — ABNORMAL HIGH (ref 70–99)
Glucose-Capillary: 171 mg/dL — ABNORMAL HIGH (ref 70–99)
Glucose-Capillary: 200 mg/dL — ABNORMAL HIGH (ref 70–99)
Glucose-Capillary: 225 mg/dL — ABNORMAL HIGH (ref 70–99)

## 2023-08-23 LAB — BLOOD GAS, ARTERIAL
Acid-Base Excess: 6.5 mmol/L — ABNORMAL HIGH (ref 0.0–2.0)
Bicarbonate: 33.9 mmol/L — ABNORMAL HIGH (ref 20.0–28.0)
FIO2: 50 %
MECHVT: 450 mL
Mechanical Rate: 18
O2 Saturation: 98.2 %
PEEP: 10 cmH2O
Patient temperature: 37
pCO2 arterial: 60 mm[Hg] — ABNORMAL HIGH (ref 32–48)
pH, Arterial: 7.36 (ref 7.35–7.45)
pO2, Arterial: 76 mm[Hg] — ABNORMAL LOW (ref 83–108)

## 2023-08-23 LAB — CBC
HCT: 32.7 % — ABNORMAL LOW (ref 36.0–46.0)
Hemoglobin: 10.7 g/dL — ABNORMAL LOW (ref 12.0–15.0)
MCH: 31.1 pg (ref 26.0–34.0)
MCHC: 32.7 g/dL (ref 30.0–36.0)
MCV: 95.1 fL (ref 80.0–100.0)
Platelets: 242 10*3/uL (ref 150–400)
RBC: 3.44 MIL/uL — ABNORMAL LOW (ref 3.87–5.11)
RDW: 14.6 % (ref 11.5–15.5)
WBC: 23.5 10*3/uL — ABNORMAL HIGH (ref 4.0–10.5)
nRBC: 0.1 % (ref 0.0–0.2)

## 2023-08-23 LAB — RENAL FUNCTION PANEL
Albumin: 1.9 g/dL — ABNORMAL LOW (ref 3.5–5.0)
Anion gap: 7 (ref 5–15)
BUN: 39 mg/dL — ABNORMAL HIGH (ref 8–23)
CO2: 29 mmol/L (ref 22–32)
Calcium: 8.6 mg/dL — ABNORMAL LOW (ref 8.9–10.3)
Chloride: 106 mmol/L (ref 98–111)
Creatinine, Ser: 0.66 mg/dL (ref 0.44–1.00)
GFR, Estimated: >60 mL/min (ref >60)
Glucose, Bld: 255 mg/dL — ABNORMAL HIGH (ref 70–99)
Phosphorus: 3.7 mg/dL (ref 2.5–4.6)
Potassium: 5.4 mmol/L — ABNORMAL HIGH (ref 3.5–5.1)
Sodium: 142 mmol/L (ref 135–145)

## 2023-08-23 LAB — TRIGLYCERIDES, BODY FLUIDS: Triglycerides, Fluid: 47 mg/dL

## 2023-08-23 LAB — BASIC METABOLIC PANEL
Anion gap: 9 (ref 5–15)
BUN: 36 mg/dL — ABNORMAL HIGH (ref 8–23)
CO2: 31 mmol/L (ref 22–32)
Calcium: 8.7 mg/dL — ABNORMAL LOW (ref 8.9–10.3)
Chloride: 105 mmol/L (ref 98–111)
Creatinine, Ser: 0.59 mg/dL (ref 0.44–1.00)
GFR, Estimated: >60 mL/min (ref >60)
Glucose, Bld: 210 mg/dL — ABNORMAL HIGH (ref 70–99)
Potassium: 5 mmol/L (ref 3.5–5.1)
Sodium: 145 mmol/L (ref 135–145)

## 2023-08-23 LAB — MAGNESIUM: Magnesium: 2.5 mg/dL — ABNORMAL HIGH (ref 1.7–2.4)

## 2023-08-23 LAB — PROCALCITONIN: Procalcitonin: 2.89 ng/mL

## 2023-08-23 MED ORDER — INSULIN ASPART 100 UNIT/ML IJ SOLN
3.0000 [IU] | INTRAMUSCULAR | Status: DC
  Administered 2023-08-23 – 2023-08-24 (×7): 3 [IU] via SUBCUTANEOUS
  Filled 2023-08-23 (×7): qty 1

## 2023-08-23 MED ORDER — VANCOMYCIN HCL 1750 MG/350ML IV SOLN: 1750.0000 mg | INTRAVENOUS | Status: DC

## 2023-08-23 MED ORDER — SODIUM CHLORIDE 0.9% FLUSH
10.0000 mL | Freq: Three times a day (TID) | INTRAVENOUS | Status: DC
Start: 2023-08-23 — End: 2023-08-24
  Administered 2023-08-23 – 2023-08-24 (×2): 10 mL via INTRAPLEURAL

## 2023-08-23 MED ORDER — AMIODARONE HCL 200 MG PO TABS
200.0000 mg | ORAL_TABLET | Freq: Two times a day (BID) | ORAL | Status: DC
  Administered 2023-08-23 – 2023-08-24 (×2): 200 mg
  Filled 2023-08-23 (×2): qty 1

## 2023-08-23 MED ORDER — SODIUM CHLORIDE (PF) 0.9 % IJ SOLN: 10.0000 mg | Freq: Once | INTRAMUSCULAR | Status: DC

## 2023-08-23 MED ORDER — STERILE WATER FOR INJECTION IJ SOLN
5.0000 mg | Freq: Once | RESPIRATORY_TRACT | Status: AC
  Administered 2023-08-23: 5 mg via INTRAPLEURAL
  Filled 2023-08-23: qty 5

## 2023-08-23 MED ORDER — ENOXAPARIN SODIUM 40 MG/0.4ML IJ SOSY
40.0000 mg | PREFILLED_SYRINGE | Freq: Every day | INTRAMUSCULAR | Status: DC
Start: 2023-08-23 — End: 2023-08-24
  Administered 2023-08-23: 40 mg via SUBCUTANEOUS
  Filled 2023-08-23: qty 0.4

## 2023-08-23 MED ORDER — VANCOMYCIN HCL 1750 MG/350ML IV SOLN
1750.0000 mg | INTRAVENOUS | Status: DC
  Administered 2023-08-23: 1750 mg via INTRAVENOUS
  Filled 2023-08-23 (×2): qty 350

## 2023-08-23 MED ORDER — AMIODARONE HCL 200 MG PO TABS: 200.0000 mg | ORAL_TABLET | Freq: Every day | ORAL | Status: DC

## 2023-08-23 MED ORDER — LACTATED RINGERS IV BOLUS
500.0000 mL | Freq: Once | INTRAVENOUS | Status: AC
  Administered 2023-08-23: 500 mL via INTRAVENOUS

## 2023-08-23 NOTE — Progress Notes (Signed)
 Rounding Note    Patient Name: Kaylee [name] Date of Encounter: 08/23/2023  [facility] Cardiologist: Timothy Gollan, MD   Subjective   Remains intubated and sedated, does not respond to commands. Telemetry shows sinus rhythm.   Inpatient Medications    Scheduled Meds:  allopurinol   100 mg Per Tube Daily   aspirin   81 mg Per Tube Daily   Chlorhexidine  Gluconate Cloth  6 each Topical Daily   docusate  100 mg Per Tube BID   enoxaparin  (LOVENOX ) injection  40 mg Subcutaneous Q2200   feeding supplement (PROSource TF20)  60 mL Per Tube Daily   free water   30 mL Per Tube Q4H   guaiFENesin   300 mg Per Tube Q6H   hydrocortisone  sod succinate (SOLU-CORTEF ) inj  100 mg Intravenous Q12H   insulin  aspart  0-9 Units Subcutaneous Q4H   insulin  aspart  3 Units Subcutaneous Q4H   multivitamin with minerals  1 tablet Per Tube Daily   mupirocin  ointment  1 Application Nasal BID   mouth rinse  15 mL Mouth Rinse Q2H   oseltamivir   75 mg Per Tube BID   pantoprazole  (PROTONIX ) IV  40 mg Intravenous Q24H   polyethylene glycol  17 g Per Tube Daily   revefenacin   175 mcg Nebulization Daily   sodium chloride  flush  10 mL Intrapleural Q8H   sodium chloride  flush  10-40 mL Intracatheter Q12H   sodium chloride  flush  20 mL Intrapleural Q6H   Continuous Infusions:  acetaminophen  1,000 mg (08/23/23 1215)   amiodarone  30 mg/hr (08/23/23 0653)   feeding supplement (VITAL HIGH PROTEIN) 50 mL/hr at 08/23/23 0653   HYDROmorphone  1.5 mg/hr (08/23/23 0653)   norepinephrine  (LEVOPHED ) Adult infusion Stopped (08/23/23 0612)   propofol  (DIPRIVAN ) infusion 40 mcg/kg/min (08/23/23 0653)   vancomycin  1,750 mg (08/23/23 1422)   PRN Meds: haloperidol  lactate, HYDROmorphone , midazolam , mouth rinse, sodium chloride , sodium chloride  flush   Vital Signs    Vitals:   08/23/23 1000 08/23/23 1100 08/23/23 1200 08/23/23 1300  BP: (!) 100/49 (!) 149/72  (!) 100/58  Pulse: 60 87 70 69  Resp: 18 19  (!) 26 (!) 22  Temp: (!) 95.9 F (35.5 C) (!) 97.2 F (36.2 C) (!) 97.3 F (36.3 C) (!) 96.8 F (36 C)  TempSrc:      SpO2: 100% 100% 93% 93%  Weight:      Height:        Intake/Output Summary (Last 24 hours) at 08/23/2023 1432 Last data filed at 08/23/2023 1432 Gross per 24 hour  Intake 2112.3 ml  Output 2225 ml  Net -112.7 ml      08/23/2023    5:00 AM 08/22/2023    5:00 AM 08/21/2023    4:21 AM  Last 3 Weights  Weight (lbs) 169 lb 1.5 oz 169 lb 1.5 oz 184 lb 8.4 oz  Weight (kg) 76.7 kg 76.7 kg 83.7 kg      Telemetry    Sinus rhythm - Personally Reviewed  Physical Exam   GEN: Sedated and intubated with mittens in place Neck: No JVD Cardiac: RRR, no murmurs, rubs, or gallops.  Respiratory: Vented breath sounds with rhonchi GI: Soft, nontender, non-distended  MS: No edema; No deformity.  Labs    High Sensitivity Troponin:   Recent Labs  Lab 08/16/23 0328  TROPONINIHS 11     Chemistry Recent Labs  Lab 08/17/23 0606 08/18/23 1610 08/19/23 1526 08/20/23 0403 08/21/23 0416 08/22/23 0419 08/23/23 0439 08/23/23 9604  NA 125*   < > 129*   < > 136 139 142 145  K 3.0*   < > 3.8   < > 4.5 5.1 5.4* 5.0  CL 94*   < > 94*   < > 101 105 106 105  CO2 18*   < > 20*   < > 27 26 29 31   GLUCOSE 100*   < > 177*   < > 175* 215* 255* 210*  BUN 19   < > 44*   < > 31* 32* 39* 36*  CREATININE 1.11*   < > 1.04*   < > 0.92 0.70 0.66 0.59  CALCIUM  7.6*   < > 8.8*   < > 8.9 9.1 8.6* 8.7*  MG  --    < > 3.1*   < > 2.9* 2.5* 2.5*  --   PROT 6.1*  --  5.8*  --   --   --   --   --   ALBUMIN  3.0*  --  2.4*  --   --   --  1.9*  --   AST 98*  --  46*  --   --   --   --   --   ALT 32  --  34  --   --   --   --   --   ALKPHOS 37*  --  33*  --   --   --   --   --   BILITOT 1.0  --  0.9  --   --   --   --   --   GFRNONAA 56*   < > >60   < > >60 >60 >60 >60  ANIONGAP 13   < > 15   < > 8 8 7 9    < > = values in this interval not displayed.    Lipids  Recent Labs  Lab 08/23/23 0439   TRIG 138    Hematology Recent Labs  Lab 08/21/23 0416 08/22/23 0419 08/23/23 0439  WBC 20.4* 16.2* 23.5*  RBC 3.34* 3.41* 3.44*  HGB 10.4* 10.5* 10.7*  HCT 29.8* 31.6* 32.7*  MCV 89.2 92.7 95.1  MCH 31.1 30.8 31.1  MCHC 34.9 33.2 32.7  RDW 13.0 13.8 14.6  PLT 227 232 242   Thyroid   Recent Labs  Lab 08/19/23 1526 08/22/23 0419  TSH 0.215*  --   FREET4  --  0.63    BNP Recent Labs  Lab 08/19/23 1526  BNP 546.7*    DDimer No results for input(s): "DDIMER" in the last 168 hours.   Radiology    DG Chest Port 1 View Result Date: 08/23/2023 IMPRESSION: Improved aeration in the right lung base with persistent opacity seen. No pneumothorax is noted. Electronically Signed   By: Violeta Grey M.D.   On: 08/23/2023 11:09   CT CHEST W CONTRAST Result Date: 08/22/2023  IMPRESSION: 1. Right chest tube in place with the pigtail in the anterolateral chest base alongside the right hemidiaphragm. 2. Right hydropneumothorax with a small pneumo component anteriorly, and a separate hydropneumothorax which could be loculated, posterior and medial to the right lower lobe. There are a few foci of air in the posterior basal fluid which could indicate infected fluid or bronchopleural fistula. The total pneumo component is estimated about 20% of the chest volume without appreciable mediastinal shift. 3. Extensive consolidative airspace disease extending from the hila into the bilateral lower lobe basal segments and into the right middle  lobe lateral segment more so than the medial segment. This is associated with both cylindrical and cystic bronchiectasis and scattered lucencies in the consolidated tissue which could indicate a necrotizing component. 4. Similar but less confluent scattered disease and bronchiectasis in the right upper lobe anterior and posterior segments. Clustered nodular airspace disease posterior segment left upper lobe. 5. Left perihilar airspace disease extending into the lingula  with additional cylindrical and cystic bronchiectasis as well as a 2.7 cm lingular cavitary with fluid level concerning for a pulmonary abscess. There a few additional small air-fluid levels in the area of right middle lobe consolidation which could indicate small abscesses or fluid pooling within cystic bronchiectasis. 6. Since the lungs appeared clear on February 3, I suspect she probably had a severe aspiration event sometime between that film and February 4. Other etiologies are possible. 7. Trace left pleural fluid and no left pneumothorax. 8. Aortic and trace coronary artery atherosclerosis. 9. Mild cardiomegaly. 10. Mildly prominent mediastinal and hilar lymph nodes, probably reactive. 11. New trace pericholecystic fluid but the gallbladder is not fully seen. Correlate clinically for coexisting cholecystitis. Ultrasound may be helpful if clinically warranted. 12. ETT and NGT in place. 13. These results will be called to the ordering clinician or representative by the Radiologist Assistant, and communication documented in the PACS or [employer]. Aortic Atherosclerosis (ICD10-I70.0). Electronically Signed   By: Denman Fischer M.D.   On: 08/22/2023 21:35   Cardiac Studies   2D echo 08/21/2023: 1. Left ventricular ejection fraction, by estimation, is 55 to 60%. The  left ventricle has normal function. The left ventricle has no regional  wall motion abnormalities. Left ventricular diastolic parameters were  normal.   2. Right ventricular systolic function is normal. The right ventricular  size is normal. There is mildly elevated pulmonary artery systolic  pressure. The estimated right ventricular systolic pressure is 38.4 mmHg.   3. Left atrial size was moderately dilated.   4. Right atrial size was mildly dilated.   5. The mitral valve is normal in structure. Mild mitral valve  regurgitation. No evidence of mitral stenosis.   6. The aortic valve is tricuspid. Aortic valve regurgitation is mild  to  moderate. Aortic valve sclerosis/calcification is present, without any  evidence of aortic stenosis. Aortic regurgitation PHT measures 540 msec.  Aortic valve Vmax measures 1.33 m/s.   7. The inferior vena cava is dilated in size with <50% respiratory  variability, suggesting right atrial pressure of 15 mmHg.   Patient Profile     63 y.o. female with history of bipolar type I disorder, Ekbom's delusional parasitosis, GAD, prior tobacco abuse, COPD, RA, depression, and chronic pain, admitted with influenza A and pneumonia, who is being seen today for the continued evaluation of aflutter and afib w/ RV R  Assessment & Plan    Atrial fibrillation/flutter with RVR - Patient admitted with flu A and PNA. Noted to be in atrial flutter with RVR 2/5 with rates up to 190s bpm. This was followed by slowing and atrial fibrillation with RVR into the 150-170s bpm. Off diltiazem  drip, not receiving metoprolol  with vasopressor support. - Converted to sinus rhythm on 2/6 - Remains in sinus rhythm - Echo 2/8 with preserved LVSF - Troponin negative - Continue IV amiodarone  with high risk of recurrent arrhythmia in the setting of acute hypoxemic respiratory failure, will consider transitioning to oral amiodarone  if able to crush and give per ng tube - Would not recommend amiodarone  in the long term  considering patient's young age. Continue until recovered from a respiratory standpoint.  - CHA2DS2VASc 1 (female) - Heparin  held 2/8 with insertion of chest tube secondary to empyema - Likely not a good candidate for long term anticoagulation given comorbid conditions  Acute hypoxic respiratory failure Influenza A Pneumonia - Transferred to the ICU on 2/6, initially requiring BiPAP with increased work of breathing. Agitated at times removing BiPAP. Now intubated. - Ongoing management per CCM - Diuretics as indicated  For questions or updates, please contact [facility] Please consult  [email or web address] for contact info under        Signed, Brodie Cannon, PA-C  08/23/2023, 2:32 PM

## 2023-08-23 NOTE — Progress Notes (Addendum)
Nutrition Follow Up Note   DOCUMENTATION CODES:   Not applicable  INTERVENTION:   Continue Vital HP @50ml /hr + ProSource TF 20- Give 60ml daily via tube  Free water flushes 30ml q4 hours to maintain tube patency   Propofol: 19.7 ml/hr- provides 520kcal/day  Regimen provides 1800kcal/day, 125g/day protein and 1137ml/day of free water.   MVI daily via tube   Daily weights   NUTRITION DIAGNOSIS:   Inadequate oral intake related to inability to eat (pt sedated and ventilated) as evidenced by NPO status. -ongoing   GOAL:   Provide needs based on ASPEN/SCCM guidelines -met   MONITOR:   Vent status, Weight trends, Labs, TF tolerance, I & O's, Skin  ASSESSMENT:   63 y/o female with h/o EDP, IBS-C, bipolar, CAD, chronic cystitis, anxiety, MDD, GERD, gout, HLD, etoh abuse, NAFLD, chronic pain, COPD, RA and substance abuse who is admitted with flu A, necrotizing PNA (MRSA) and new Afib complicated by ARDS.  Pt s/p chest tube placement 2/8  Pt remains sedated and ventilated. Pt is tolerating tube feeds well at goal rate. Refeed labs improved. Pt with liquid BMs; rectal tube placed today. Per chart, pt appears to be around her UBW. Pt + 3.8L on her I & Os. Pt undergoing SBTs today.   Medications reviewed and include: allopurinol, aspirin, colace, lovenox, solu-cortef, insulin, MVI, protonix, miralax, hydromorphone, propofol, vancomycin   Labs reviewed: K 5.0 wnl, BUN 36(H), P 3.7 wnl, Mg 2.5(H) Cbgs- 171, 200, 225 x 24 hrs   Patient is currently intubated on ventilator support MV: 12.8 L/min Temp (24hrs), Avg:98.4 F (36.9 C), Min:96.8 F (36 C), Max:100 F (37.8 C)  MAP >60mmHg   UOP-   Chest tube- output   Diet Order:   Diet Order             Diet NPO time specified  Diet effective now                  EDUCATION NEEDS:   No education needs have been identified at this time  Skin:  Skin Assessment: Reviewed RN Assessment  Last BM:  2/10-  TYPE 7  Height:   Ht Readings from Last 1 Encounters:  08/18/23 5\' 6"  (1.676 m)    Weight:   Wt Readings from Last 1 Encounters:  08/24/23 84.6 kg    Ideal Body Weight:  59 kg  BMI:  Body mass index is 30.1 kg/m.  Estimated Nutritional Needs:   Kcal:  1970kcal/day  Protein:  110-125g/day  Fluid:  1.8-2.1L/day  Betsey Holiday MS, RD, LDN If unable to be reached, please send secure chat to "RD inpatient" available from 8:00a-4:00p daily
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
## 2023-08-23 NOTE — Consult Note (Signed)
 Provider went to see the patient in ICU and patient is intubated. Unable to perform the consult. Psychiatry will revisit when patient is extubated and able to participate in interview.

## 2023-08-23 NOTE — Procedures (Signed)
 Pleural Fibrinolytic Administration Procedure Note  Kaylee [name]  [id number]  07/02/1961  Date:08/23/23  Time:3:22 PM   Provider Performing:Jean-Pierre Guynell Kleiber   Procedure: Pleural Fibrinolysis Subsequent day (40981)  Indication(s) Fibrinolysis of complicated pleural effusion  Consent Risks of the procedure as well as the alternatives and risks of each were explained to the patient and/or caregiver.  Consent for the procedure was obtained.   Anesthesia None   Time Out Verified patient identification, verified procedure, site/side was marked, verified correct patient position, special equipment/implants available, medications/allergies/relevant history reviewed, required imaging and test results available.   Sterile Technique Hand hygiene, gloves   Procedure Description Existing pleural catheter was cleaned and accessed in sterile manner. 5mg  of dornase in 30cc of sterile water  were injected into pleural space using existing pleural catheter.  TPA was not given as fluid output was sanguinous. Catheter will be clamped for 1 hour and then placed back to suction at -40cmH2O.   Complications/Tolerance None; patient tolerated the procedure well.  EBL None  Specimen(s) None   Kaylee Kindler, MD [facility] 08/23/2023 3:23 PM
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
## 2023-08-23 NOTE — Progress Notes (Signed)
 Updated pts daughter Ulani Roederer via telephone regarding pts condition and current plan of care all questions were answered.  Will continue to monitor and assess pt  Janey Meek, [facility]  Pulmonary/Critical Care Pager [phone] (please enter 7 digits) PCCM Consult Pager [phone] (please enter 7 digits)

## 2023-08-23 NOTE — Inpatient Diabetes Management (Signed)
 Inpatient Diabetes Program Recommendations  AACE/ADA: New Consensus Statement on Inpatient Glycemic Control (2015)  Target Ranges:  Prepandial:   less than 140 mg/dL      Peak postprandial:   less than 180 mg/dL (1-2 hours)      Critically ill patients:  140 - 180 mg/dL    Latest Reference Range & Units 08/21/23 23:49 08/22/23 04:09 08/22/23 07:39 08/22/23 11:11 08/22/23 15:53 08/22/23 19:36  Glucose-Capillary 70 - 99 mg/dL 161 (H)  3 units Novolog   208 (H)  3 units Novolog   164 (H)  2 units Novolog   169 (H)  2 units Novolog   172 (H)  5 units Novolog   178 (H)  2 units Novolog    (H): Data is abnormally high  Latest Reference Range & Units 08/22/23 23:54 08/23/23 04:28 08/23/23 07:55  Glucose-Capillary 70 - 99 mg/dL 096 (H)  2 units Novolog   225 (H)  3 units Novolog   200 (H)  2 units Novolog    (H): Data is abnormally high    Admit with:  Acute hypoxemic respiratory failure Flu A with possible superimposed pneumonia    No History of Diabetes noted  Current Insulin  Orders: Novolog  Sensitive Correction Scale/ SSI (0-9 units) Q4 hours     Intubated Tube Feeds running 50cc/hr Getting Solucortef 100 mg BID   MD- Please consider adding Novolog  Tube Feed Coverage: Novolog  3 units Q4 hours HOLD if tube feeds HELD for any reason    --Will follow patient during hospitalization--  Langston Pippins RN, MSN, CDCES Diabetes Coordinator Inpatient Glycemic Control Team Team Pager: [phone] (8a-5p)

## 2023-08-23 NOTE — Plan of Care (Signed)
  Problem: Clinical Measurements: Goal: Ability to maintain clinical measurements within normal limits will improve Outcome: Progressing Goal: Diagnostic test results will improve Outcome: Progressing Goal: Respiratory complications will improve Outcome: Progressing   Problem: Nutrition: Goal: Adequate nutrition will be maintained Outcome: Progressing

## 2023-08-24 ENCOUNTER — Inpatient Hospital Stay (HOSPITAL_COMMUNITY)
Admission: AD | Admit: 2023-08-24 | Discharge: 2023-09-11 | DRG: 870 | Disposition: E | Payer: 59 | Source: Other Acute Inpatient Hospital | Attending: Internal Medicine | Admitting: Internal Medicine

## 2023-08-24 ENCOUNTER — Encounter (HOSPITAL_COMMUNITY): Payer: Self-pay

## 2023-08-24 ENCOUNTER — Inpatient Hospital Stay: Payer: 59

## 2023-08-24 ENCOUNTER — Inpatient Hospital Stay (HOSPITAL_COMMUNITY): Payer: 59

## 2023-08-24 DIAGNOSIS — Z66 Do not resuscitate: Secondary | ICD-10-CM | POA: Diagnosis present

## 2023-08-24 DIAGNOSIS — D649 Anemia, unspecified: Secondary | ICD-10-CM | POA: Diagnosis present

## 2023-08-24 DIAGNOSIS — Y831 Surgical operation with implant of artificial internal device as the cause of abnormal reaction of the patient, or of later complication, without mention of misadventure at the time of the procedure: Secondary | ICD-10-CM | POA: Diagnosis not present

## 2023-08-24 DIAGNOSIS — I10 Essential (primary) hypertension: Secondary | ICD-10-CM | POA: Diagnosis present

## 2023-08-24 DIAGNOSIS — Z803 Family history of malignant neoplasm of breast: Secondary | ICD-10-CM

## 2023-08-24 DIAGNOSIS — J15212 Pneumonia due to Methicillin resistant Staphylococcus aureus: Secondary | ICD-10-CM | POA: Diagnosis present

## 2023-08-24 DIAGNOSIS — A6000 Herpesviral infection of urogenital system, unspecified: Secondary | ICD-10-CM | POA: Diagnosis not present

## 2023-08-24 DIAGNOSIS — B9562 Methicillin resistant Staphylococcus aureus infection as the cause of diseases classified elsewhere: Secondary | ICD-10-CM | POA: Diagnosis not present

## 2023-08-24 DIAGNOSIS — Z4682 Encounter for fitting and adjustment of non-vascular catheter: Secondary | ICD-10-CM | POA: Diagnosis not present

## 2023-08-24 DIAGNOSIS — Z811 Family history of alcohol abuse and dependence: Secondary | ICD-10-CM

## 2023-08-24 DIAGNOSIS — Z885 Allergy status to narcotic agent status: Secondary | ICD-10-CM

## 2023-08-24 DIAGNOSIS — Z6830 Body mass index (BMI) 30.0-30.9, adult: Secondary | ICD-10-CM | POA: Diagnosis not present

## 2023-08-24 DIAGNOSIS — G8929 Other chronic pain: Secondary | ICD-10-CM | POA: Diagnosis present

## 2023-08-24 DIAGNOSIS — J440 Chronic obstructive pulmonary disease with acute lower respiratory infection: Secondary | ICD-10-CM | POA: Diagnosis present

## 2023-08-24 DIAGNOSIS — R042 Hemoptysis: Secondary | ICD-10-CM | POA: Diagnosis not present

## 2023-08-24 DIAGNOSIS — I480 Paroxysmal atrial fibrillation: Secondary | ICD-10-CM | POA: Diagnosis present

## 2023-08-24 DIAGNOSIS — R6521 Severe sepsis with septic shock: Secondary | ICD-10-CM | POA: Diagnosis present

## 2023-08-24 DIAGNOSIS — Z888 Allergy status to other drugs, medicaments and biological substances status: Secondary | ICD-10-CM

## 2023-08-24 DIAGNOSIS — Z7951 Long term (current) use of inhaled steroids: Secondary | ICD-10-CM

## 2023-08-24 DIAGNOSIS — I639 Cerebral infarction, unspecified: Secondary | ICD-10-CM | POA: Diagnosis not present

## 2023-08-24 DIAGNOSIS — F3110 Bipolar disorder, current episode manic without psychotic features, unspecified: Secondary | ICD-10-CM | POA: Diagnosis not present

## 2023-08-24 DIAGNOSIS — J939 Pneumothorax, unspecified: Secondary | ICD-10-CM | POA: Diagnosis present

## 2023-08-24 DIAGNOSIS — I96 Gangrene, not elsewhere classified: Secondary | ICD-10-CM | POA: Diagnosis not present

## 2023-08-24 DIAGNOSIS — J1001 Influenza due to other identified influenza virus with the same other identified influenza virus pneumonia: Secondary | ICD-10-CM | POA: Diagnosis not present

## 2023-08-24 DIAGNOSIS — J9690 Respiratory failure, unspecified, unspecified whether with hypoxia or hypercapnia: Secondary | ICD-10-CM | POA: Diagnosis not present

## 2023-08-24 DIAGNOSIS — J09X2 Influenza due to identified novel influenza A virus with other respiratory manifestations: Secondary | ICD-10-CM | POA: Diagnosis not present

## 2023-08-24 DIAGNOSIS — E46 Unspecified protein-calorie malnutrition: Secondary | ICD-10-CM | POA: Diagnosis present

## 2023-08-24 DIAGNOSIS — F22 Delusional disorders: Secondary | ICD-10-CM | POA: Diagnosis present

## 2023-08-24 DIAGNOSIS — J90 Pleural effusion, not elsewhere classified: Secondary | ICD-10-CM | POA: Diagnosis present

## 2023-08-24 DIAGNOSIS — J850 Gangrene and necrosis of lung: Secondary | ICD-10-CM | POA: Diagnosis present

## 2023-08-24 DIAGNOSIS — N179 Acute kidney failure, unspecified: Secondary | ICD-10-CM | POA: Diagnosis not present

## 2023-08-24 DIAGNOSIS — Z515 Encounter for palliative care: Secondary | ICD-10-CM

## 2023-08-24 DIAGNOSIS — J158 Pneumonia due to other specified bacteria: Secondary | ICD-10-CM | POA: Diagnosis not present

## 2023-08-24 DIAGNOSIS — R509 Fever, unspecified: Secondary | ICD-10-CM | POA: Diagnosis not present

## 2023-08-24 DIAGNOSIS — B965 Pseudomonas (aeruginosa) (mallei) (pseudomallei) as the cause of diseases classified elsewhere: Secondary | ICD-10-CM | POA: Diagnosis not present

## 2023-08-24 DIAGNOSIS — M0600 Rheumatoid arthritis without rheumatoid factor, unspecified site: Secondary | ICD-10-CM | POA: Diagnosis not present

## 2023-08-24 DIAGNOSIS — Y848 Other medical procedures as the cause of abnormal reaction of the patient, or of later complication, without mention of misadventure at the time of the procedure: Secondary | ICD-10-CM | POA: Diagnosis not present

## 2023-08-24 DIAGNOSIS — R918 Other nonspecific abnormal finding of lung field: Secondary | ICD-10-CM | POA: Diagnosis not present

## 2023-08-24 DIAGNOSIS — E870 Hyperosmolality and hypernatremia: Secondary | ICD-10-CM | POA: Diagnosis not present

## 2023-08-24 DIAGNOSIS — G9341 Metabolic encephalopathy: Secondary | ICD-10-CM | POA: Diagnosis not present

## 2023-08-24 DIAGNOSIS — R0603 Acute respiratory distress: Secondary | ICD-10-CM | POA: Diagnosis not present

## 2023-08-24 DIAGNOSIS — Z90710 Acquired absence of both cervix and uterus: Secondary | ICD-10-CM

## 2023-08-24 DIAGNOSIS — J09X1 Influenza due to identified novel influenza A virus with pneumonia: Secondary | ICD-10-CM | POA: Diagnosis not present

## 2023-08-24 DIAGNOSIS — J1008 Influenza due to other identified influenza virus with other specified pneumonia: Secondary | ICD-10-CM | POA: Diagnosis present

## 2023-08-24 DIAGNOSIS — Z881 Allergy status to other antibiotic agents status: Secondary | ICD-10-CM

## 2023-08-24 DIAGNOSIS — J9601 Acute respiratory failure with hypoxia: Secondary | ICD-10-CM | POA: Diagnosis present

## 2023-08-24 DIAGNOSIS — R059 Cough, unspecified: Secondary | ICD-10-CM | POA: Diagnosis present

## 2023-08-24 DIAGNOSIS — E8770 Fluid overload, unspecified: Secondary | ICD-10-CM | POA: Diagnosis not present

## 2023-08-24 DIAGNOSIS — Z452 Encounter for adjustment and management of vascular access device: Secondary | ICD-10-CM | POA: Diagnosis not present

## 2023-08-24 DIAGNOSIS — F17210 Nicotine dependence, cigarettes, uncomplicated: Secondary | ICD-10-CM | POA: Diagnosis present

## 2023-08-24 DIAGNOSIS — J441 Chronic obstructive pulmonary disease with (acute) exacerbation: Secondary | ICD-10-CM | POA: Diagnosis present

## 2023-08-24 DIAGNOSIS — E1165 Type 2 diabetes mellitus with hyperglycemia: Secondary | ICD-10-CM | POA: Diagnosis not present

## 2023-08-24 DIAGNOSIS — A6009 Herpesviral infection of other urogenital tract: Secondary | ICD-10-CM | POA: Diagnosis not present

## 2023-08-24 DIAGNOSIS — G9349 Other encephalopathy: Secondary | ICD-10-CM | POA: Diagnosis not present

## 2023-08-24 DIAGNOSIS — J95851 Ventilator associated pneumonia: Secondary | ICD-10-CM | POA: Diagnosis not present

## 2023-08-24 DIAGNOSIS — J869 Pyothorax without fistula: Secondary | ICD-10-CM | POA: Diagnosis not present

## 2023-08-24 DIAGNOSIS — A419 Sepsis, unspecified organism: Secondary | ICD-10-CM | POA: Diagnosis not present

## 2023-08-24 DIAGNOSIS — B3731 Acute candidiasis of vulva and vagina: Secondary | ICD-10-CM | POA: Diagnosis not present

## 2023-08-24 DIAGNOSIS — J129 Viral pneumonia, unspecified: Secondary | ICD-10-CM | POA: Diagnosis not present

## 2023-08-24 DIAGNOSIS — Z2239 Carrier of other specified bacterial diseases: Secondary | ICD-10-CM

## 2023-08-24 DIAGNOSIS — Z9911 Dependence on respirator [ventilator] status: Secondary | ICD-10-CM | POA: Diagnosis not present

## 2023-08-24 DIAGNOSIS — Z87440 Personal history of urinary (tract) infections: Secondary | ICD-10-CM

## 2023-08-24 DIAGNOSIS — E876 Hypokalemia: Secondary | ICD-10-CM | POA: Diagnosis not present

## 2023-08-24 DIAGNOSIS — J948 Other specified pleural conditions: Secondary | ICD-10-CM | POA: Diagnosis not present

## 2023-08-24 DIAGNOSIS — Z79899 Other long term (current) drug therapy: Secondary | ICD-10-CM | POA: Diagnosis not present

## 2023-08-24 DIAGNOSIS — A4102 Sepsis due to Methicillin resistant Staphylococcus aureus: Secondary | ICD-10-CM | POA: Diagnosis present

## 2023-08-24 DIAGNOSIS — Z87410 Personal history of cervical dysplasia: Secondary | ICD-10-CM

## 2023-08-24 DIAGNOSIS — T85898A Other specified complication of other internal prosthetic devices, implants and grafts, initial encounter: Secondary | ICD-10-CM | POA: Diagnosis not present

## 2023-08-24 DIAGNOSIS — G7281 Critical illness myopathy: Secondary | ICD-10-CM | POA: Diagnosis not present

## 2023-08-24 DIAGNOSIS — E871 Hypo-osmolality and hyponatremia: Secondary | ICD-10-CM | POA: Diagnosis not present

## 2023-08-24 DIAGNOSIS — F15959 Other stimulant use, unspecified with stimulant-induced psychotic disorder, unspecified: Secondary | ICD-10-CM | POA: Diagnosis not present

## 2023-08-24 DIAGNOSIS — E785 Hyperlipidemia, unspecified: Secondary | ICD-10-CM | POA: Diagnosis present

## 2023-08-24 DIAGNOSIS — M797 Fibromyalgia: Secondary | ICD-10-CM | POA: Diagnosis present

## 2023-08-24 DIAGNOSIS — L89819 Pressure ulcer of head, unspecified stage: Secondary | ICD-10-CM | POA: Diagnosis not present

## 2023-08-24 DIAGNOSIS — Z820 Family history of epilepsy and other diseases of the nervous system: Secondary | ICD-10-CM

## 2023-08-24 DIAGNOSIS — Z800 Family history of malignant neoplasm of digestive organs: Secondary | ICD-10-CM

## 2023-08-24 DIAGNOSIS — R627 Adult failure to thrive: Secondary | ICD-10-CM | POA: Diagnosis not present

## 2023-08-24 DIAGNOSIS — K219 Gastro-esophageal reflux disease without esophagitis: Secondary | ICD-10-CM | POA: Diagnosis present

## 2023-08-24 DIAGNOSIS — A4189 Other specified sepsis: Secondary | ICD-10-CM | POA: Diagnosis not present

## 2023-08-24 DIAGNOSIS — Z8249 Family history of ischemic heart disease and other diseases of the circulatory system: Secondary | ICD-10-CM

## 2023-08-24 DIAGNOSIS — Z87892 Personal history of anaphylaxis: Secondary | ICD-10-CM

## 2023-08-24 DIAGNOSIS — F1510 Other stimulant abuse, uncomplicated: Secondary | ICD-10-CM | POA: Diagnosis present

## 2023-08-24 DIAGNOSIS — I4892 Unspecified atrial flutter: Secondary | ICD-10-CM | POA: Diagnosis not present

## 2023-08-24 DIAGNOSIS — T783XXA Angioneurotic edema, initial encounter: Secondary | ICD-10-CM | POA: Diagnosis not present

## 2023-08-24 DIAGNOSIS — F411 Generalized anxiety disorder: Secondary | ICD-10-CM | POA: Diagnosis present

## 2023-08-24 DIAGNOSIS — F13239 Sedative, hypnotic or anxiolytic dependence with withdrawal, unspecified: Secondary | ICD-10-CM | POA: Diagnosis not present

## 2023-08-24 DIAGNOSIS — J9602 Acute respiratory failure with hypercapnia: Secondary | ICD-10-CM | POA: Diagnosis present

## 2023-08-24 DIAGNOSIS — B379 Candidiasis, unspecified: Secondary | ICD-10-CM | POA: Diagnosis not present

## 2023-08-24 DIAGNOSIS — Z882 Allergy status to sulfonamides status: Secondary | ICD-10-CM

## 2023-08-24 DIAGNOSIS — I469 Cardiac arrest, cause unspecified: Secondary | ICD-10-CM | POA: Diagnosis not present

## 2023-08-24 DIAGNOSIS — I4891 Unspecified atrial fibrillation: Secondary | ICD-10-CM | POA: Diagnosis not present

## 2023-08-24 DIAGNOSIS — Z7982 Long term (current) use of aspirin: Secondary | ICD-10-CM

## 2023-08-24 DIAGNOSIS — F319 Bipolar disorder, unspecified: Secondary | ICD-10-CM | POA: Diagnosis present

## 2023-08-24 HISTORY — DX: Alcohol abuse, uncomplicated: F10.10

## 2023-08-24 HISTORY — DX: Anxiety disorder, unspecified: F41.9

## 2023-08-24 HISTORY — DX: Tobacco use: Z72.0

## 2023-08-24 HISTORY — DX: Fibromyalgia: M79.7

## 2023-08-24 HISTORY — DX: Paroxysmal atrial fibrillation: I48.0

## 2023-08-24 HISTORY — DX: Generalized anxiety disorder: F41.1

## 2023-08-24 HISTORY — DX: Bipolar disorder, unspecified: F31.9

## 2023-08-24 HISTORY — DX: Unspecified psychosis not due to a substance or known physiological condition: F29

## 2023-08-24 HISTORY — DX: Cocaine abuse, uncomplicated: F14.10

## 2023-08-24 HISTORY — DX: Other psychoactive substance abuse, uncomplicated: F19.10

## 2023-08-24 HISTORY — DX: Other stimulant abuse, uncomplicated: F15.10

## 2023-08-24 HISTORY — DX: Delusional disorders: F22

## 2023-08-24 LAB — BODY FLUID CULTURE W GRAM STAIN

## 2023-08-24 LAB — POCT I-STAT 7, (LYTES, BLD GAS, ICA,H+H)
Acid-Base Excess: 7 mmol/L — ABNORMAL HIGH (ref 0.0–2.0)
Bicarbonate: 35 mmol/L — ABNORMAL HIGH (ref 20.0–28.0)
Calcium, Ion: 1.36 mmol/L (ref 1.15–1.40)
HCT: 33 % — ABNORMAL LOW (ref 36.0–46.0)
Hemoglobin: 11.2 g/dL — ABNORMAL LOW (ref 12.0–15.0)
O2 Saturation: 99 %
Potassium: 5.1 mmol/L (ref 3.5–5.1)
Sodium: 145 mmol/L (ref 135–145)
TCO2: 37 mmol/L — ABNORMAL HIGH (ref 22–32)
pCO2 arterial: 64.4 mm[Hg] — ABNORMAL HIGH (ref 32–48)
pH, Arterial: 7.343 — ABNORMAL LOW (ref 7.35–7.45)
pO2, Arterial: 160 mm[Hg] — ABNORMAL HIGH (ref 83–108)

## 2023-08-24 LAB — BASIC METABOLIC PANEL
Anion gap: 7 (ref 5–15)
BUN: 34 mg/dL — ABNORMAL HIGH (ref 8–23)
CO2: 30 mmol/L (ref 22–32)
Calcium: 8.9 mg/dL (ref 8.9–10.3)
Chloride: 107 mmol/L (ref 98–111)
Creatinine, Ser: 0.5 mg/dL (ref 0.44–1.00)
GFR, Estimated: >60 mL/min (ref >60)
Glucose, Bld: 179 mg/dL — ABNORMAL HIGH (ref 70–99)
Potassium: 5 mmol/L (ref 3.5–5.1)
Sodium: 144 mmol/L (ref 135–145)

## 2023-08-24 LAB — RENAL FUNCTION PANEL
Albumin: 1.8 g/dL — ABNORMAL LOW (ref 3.5–5.0)
Anion gap: 7 (ref 5–15)
BUN: 34 mg/dL — ABNORMAL HIGH (ref 8–23)
CO2: 30 mmol/L (ref 22–32)
Calcium: 8.8 mg/dL — ABNORMAL LOW (ref 8.9–10.3)
Chloride: 108 mmol/L (ref 98–111)
Creatinine, Ser: 0.48 mg/dL (ref 0.44–1.00)
GFR, Estimated: >60 mL/min (ref >60)
Glucose, Bld: 185 mg/dL — ABNORMAL HIGH (ref 70–99)
Phosphorus: 2.7 mg/dL (ref 2.5–4.6)
Potassium: 5 mmol/L (ref 3.5–5.1)
Sodium: 145 mmol/L (ref 135–145)

## 2023-08-24 LAB — MRSA NEXT GEN BY PCR, NASAL: MRSA by PCR Next Gen: DETECTED — AB

## 2023-08-24 LAB — HEPATIC FUNCTION PANEL
ALT: 24 U/L (ref 0–44)
AST: 21 U/L (ref 15–41)
Albumin: 1.7 g/dL — ABNORMAL LOW (ref 3.5–5.0)
Alkaline Phosphatase: 44 U/L (ref 38–126)
Bilirubin, Direct: <0.1 mg/dL (ref 0.0–0.2)
Total Bilirubin: 0.3 mg/dL (ref 0.0–1.2)
Total Protein: 5.6 g/dL — ABNORMAL LOW (ref 6.5–8.1)

## 2023-08-24 LAB — CBC
HCT: 32.9 % — ABNORMAL LOW (ref 36.0–46.0)
Hemoglobin: 10.5 g/dL — ABNORMAL LOW (ref 12.0–15.0)
MCH: 31 pg (ref 26.0–34.0)
MCHC: 31.9 g/dL (ref 30.0–36.0)
MCV: 97.1 fL (ref 80.0–100.0)
Platelets: 248 10*3/uL (ref 150–400)
RBC: 3.39 MIL/uL — ABNORMAL LOW (ref 3.87–5.11)
RDW: 14.6 % (ref 11.5–15.5)
WBC: 25 10*3/uL — ABNORMAL HIGH (ref 4.0–10.5)
nRBC: 0.1 % (ref 0.0–0.2)

## 2023-08-24 LAB — GLUCOSE, CAPILLARY
Glucose-Capillary: 111 mg/dL — ABNORMAL HIGH (ref 70–99)
Glucose-Capillary: 116 mg/dL — ABNORMAL HIGH (ref 70–99)
Glucose-Capillary: 119 mg/dL — ABNORMAL HIGH (ref 70–99)
Glucose-Capillary: 150 mg/dL — ABNORMAL HIGH (ref 70–99)
Glucose-Capillary: 158 mg/dL — ABNORMAL HIGH (ref 70–99)
Glucose-Capillary: 177 mg/dL — ABNORMAL HIGH (ref 70–99)

## 2023-08-24 LAB — MAGNESIUM: Magnesium: 2.5 mg/dL — ABNORMAL HIGH (ref 1.7–2.4)

## 2023-08-24 LAB — CYTOLOGY - NON PAP

## 2023-08-24 MED ORDER — PROSOURCE TF20 ENFIT COMPATIBL EN LIQD: 60.0000 mL | Freq: Every day | ENTERAL | Status: DC

## 2023-08-24 MED ORDER — DOCUSATE SODIUM 50 MG/5ML PO LIQD
100.0000 mg | Freq: Two times a day (BID) | ORAL | Status: DC | PRN
  Administered 2023-08-26: 100 mg
  Filled 2023-08-24: qty 10

## 2023-08-24 MED ORDER — HYDRALAZINE HCL 20 MG/ML IJ SOLN
10.0000 mg | Freq: Four times a day (QID) | INTRAMUSCULAR | Status: DC | PRN
  Administered 2023-08-25: 10 mg via INTRAVENOUS
  Filled 2023-08-24: qty 1

## 2023-08-24 MED ORDER — ORAL CARE MOUTH RINSE
15.0000 mL | OROMUCOSAL | Status: DC
  Administered 2023-08-24 – 2023-09-06 (×156): 15 mL via OROMUCOSAL

## 2023-08-24 MED ORDER — REVEFENACIN 175 MCG/3ML IN SOLN: 175.0000 ug | Freq: Every day | RESPIRATORY_TRACT | Status: DC

## 2023-08-24 MED ORDER — FENTANYL BOLUS VIA INFUSION
50.0000 ug | INTRAVENOUS | Status: DC | PRN
  Administered 2023-08-24: 50 ug via INTRAVENOUS
  Administered 2023-08-24: 100 ug via INTRAVENOUS
  Administered 2023-08-24: 50 ug via INTRAVENOUS

## 2023-08-24 MED ORDER — ACETAMINOPHEN 160 MG/5ML PO SOLN
650.0000 mg | Freq: Four times a day (QID) | ORAL | Status: DC | PRN
  Administered 2023-08-24 – 2023-08-26 (×3): 650 mg
  Filled 2023-08-24 (×3): qty 20.3

## 2023-08-24 MED ORDER — PROPOFOL 1000 MG/100ML IV EMUL
0.0000 ug/kg/min | INTRAVENOUS | Status: DC
  Administered 2023-08-24 – 2023-08-25 (×4): 40 ug/kg/min via INTRAVENOUS
  Administered 2023-08-25 (×2): 50 ug/kg/min via INTRAVENOUS
  Administered 2023-08-25: 25 ug/kg/min via INTRAVENOUS
  Administered 2023-08-25 – 2023-08-26 (×5): 50 ug/kg/min via INTRAVENOUS
  Administered 2023-08-26: 40 ug/kg/min via INTRAVENOUS
  Administered 2023-08-26 – 2023-08-29 (×21): 50 ug/kg/min via INTRAVENOUS
  Administered 2023-08-30: 40 ug/kg/min via INTRAVENOUS
  Administered 2023-08-30 (×4): 50 ug/kg/min via INTRAVENOUS
  Administered 2023-08-31: 70 ug/kg/min via INTRAVENOUS
  Administered 2023-08-31: 50 ug/kg/min via INTRAVENOUS
  Administered 2023-08-31: 70 ug/kg/min via INTRAVENOUS
  Administered 2023-08-31 (×2): 50 ug/kg/min via INTRAVENOUS
  Administered 2023-08-31 – 2023-09-01 (×3): 70 ug/kg/min via INTRAVENOUS
  Administered 2023-09-01 (×3): 60 ug/kg/min via INTRAVENOUS
  Administered 2023-09-01: 50 ug/kg/min via INTRAVENOUS
  Administered 2023-09-01 (×3): 60 ug/kg/min via INTRAVENOUS
  Administered 2023-09-02 – 2023-09-03 (×8): 80 ug/kg/min via INTRAVENOUS
  Administered 2023-09-03: 60 ug/kg/min via INTRAVENOUS
  Administered 2023-09-03: 70 ug/kg/min via INTRAVENOUS
  Administered 2023-09-03: 90 ug/kg/min via INTRAVENOUS
  Administered 2023-09-03: 70 ug/kg/min via INTRAVENOUS
  Administered 2023-09-03: 60 ug/kg/min via INTRAVENOUS
  Administered 2023-09-03: 90 ug/kg/min via INTRAVENOUS
  Administered 2023-09-03 – 2023-09-04 (×4): 80 ug/kg/min via INTRAVENOUS
  Administered 2023-09-04: 90 ug/kg/min via INTRAVENOUS
  Administered 2023-09-04: 80 ug/kg/min via INTRAVENOUS
  Administered 2023-09-04: 60 ug/kg/min via INTRAVENOUS
  Administered 2023-09-04: 80 ug/kg/min via INTRAVENOUS
  Filled 2023-08-24: qty 100
  Filled 2023-08-24: qty 200
  Filled 2023-08-24 (×4): qty 100
  Filled 2023-08-24: qty 300
  Filled 2023-08-24: qty 100
  Filled 2023-08-24: qty 300
  Filled 2023-08-24 (×21): qty 100
  Filled 2023-08-24 (×2): qty 200
  Filled 2023-08-24 (×15): qty 100
  Filled 2023-08-24: qty 300
  Filled 2023-08-24 (×7): qty 100
  Filled 2023-08-24: qty 300
  Filled 2023-08-24 (×3): qty 100

## 2023-08-24 MED ORDER — PANTOPRAZOLE SODIUM 40 MG IV SOLR
40.0000 mg | Freq: Every day | INTRAVENOUS | Status: DC
  Administered 2023-08-25 – 2023-09-06 (×13): 40 mg via INTRAVENOUS
  Filled 2023-08-24 (×13): qty 10

## 2023-08-24 MED ORDER — MUPIROCIN 2 % EX OINT: 1.0000 | TOPICAL_OINTMENT | Freq: Two times a day (BID) | CUTANEOUS | Status: DC

## 2023-08-24 MED ORDER — ADULT MULTIVITAMIN W/MINERALS CH: 1.0000 | ORAL_TABLET | Freq: Every day | ORAL | Status: DC

## 2023-08-24 MED ORDER — REVEFENACIN 175 MCG/3ML IN SOLN
175.0000 ug | Freq: Every day | RESPIRATORY_TRACT | Status: DC
  Administered 2023-08-25 – 2023-09-06 (×13): 175 ug via RESPIRATORY_TRACT
  Filled 2023-08-24 (×14): qty 3

## 2023-08-24 MED ORDER — ARFORMOTEROL TARTRATE 15 MCG/2ML IN NEBU
15.0000 ug | INHALATION_SOLUTION | Freq: Two times a day (BID) | RESPIRATORY_TRACT | Status: DC
  Administered 2023-08-24 – 2023-09-06 (×26): 15 ug via RESPIRATORY_TRACT
  Filled 2023-08-24 (×26): qty 2

## 2023-08-24 MED ORDER — SODIUM CHLORIDE 0.9% FLUSH: 10.0000 mL | Freq: Three times a day (TID) | INTRAVENOUS | Status: DC

## 2023-08-24 MED ORDER — ASPIRIN 81 MG PO CHEW: 81.0000 mg | CHEWABLE_TABLET | Freq: Every day | ORAL | Status: DC

## 2023-08-24 MED ORDER — BUDESONIDE 0.25 MG/2ML IN SUSP
0.2500 mg | Freq: Two times a day (BID) | RESPIRATORY_TRACT | Status: DC
  Administered 2023-08-24 – 2023-08-25 (×2): 0.25 mg via RESPIRATORY_TRACT
  Filled 2023-08-24 (×2): qty 2

## 2023-08-24 MED ORDER — FREE WATER: 30.0000 mL | Status: DC

## 2023-08-24 MED ORDER — OSELTAMIVIR PHOSPHATE 75 MG PO CAPS: 75.0000 mg | ORAL_CAPSULE | Freq: Two times a day (BID) | ORAL | Status: DC

## 2023-08-24 MED ORDER — ORAL CARE MOUTH RINSE: 15.0000 mL | OROMUCOSAL | Status: DC | PRN

## 2023-08-24 MED ORDER — PANTOPRAZOLE SODIUM 40 MG IV SOLR: 40.0000 mg | INTRAVENOUS | Status: DC

## 2023-08-24 MED ORDER — VANCOMYCIN HCL IN DEXTROSE 1-5 GM/200ML-% IV SOLN
1000.0000 mg | Freq: Two times a day (BID) | INTRAVENOUS | Status: DC
  Administered 2023-08-24: 1000 mg via INTRAVENOUS
  Filled 2023-08-24: qty 200

## 2023-08-24 MED ORDER — HYDROCORTISONE SOD SUC (PF) 100 MG IJ SOLR
100.0000 mg | Freq: Two times a day (BID) | INTRAMUSCULAR | Status: DC
  Administered 2023-08-24: 100 mg via INTRAVENOUS
  Filled 2023-08-24: qty 2

## 2023-08-24 MED ORDER — FENTANYL 2500MCG IN NS 250ML (10MCG/ML) PREMIX INFUSION
50.0000 ug/h | INTRAVENOUS | Status: DC
  Administered 2023-08-24: 50 ug/h via INTRAVENOUS
  Filled 2023-08-24: qty 250

## 2023-08-24 MED ORDER — ENOXAPARIN SODIUM 40 MG/0.4ML IJ SOSY: 40.0000 mg | PREFILLED_SYRINGE | Freq: Every day | INTRAMUSCULAR | Status: DC

## 2023-08-24 MED ORDER — DOCUSATE SODIUM 50 MG/5ML PO LIQD
100.0000 mg | Freq: Two times a day (BID) | ORAL | Status: DC | PRN
Start: 2023-08-24 — End: 2023-08-24

## 2023-08-24 MED ORDER — CLINDAMYCIN PHOSPHATE 600 MG/50ML IV SOLN: 600.0000 mg | Freq: Three times a day (TID) | INTRAVENOUS | Status: DC

## 2023-08-24 MED ORDER — VANCOMYCIN HCL 1750 MG/350ML IV SOLN: 1750.0000 mg | INTRAVENOUS | Status: DC

## 2023-08-24 MED ORDER — DOCUSATE SODIUM 100 MG PO CAPS: 100.0000 mg | ORAL_CAPSULE | Freq: Two times a day (BID) | ORAL | Status: DC | PRN

## 2023-08-24 MED ORDER — OSELTAMIVIR PHOSPHATE 75 MG PO CAPS
75.0000 mg | ORAL_CAPSULE | Freq: Two times a day (BID) | ORAL | Status: AC
  Administered 2023-08-24 – 2023-08-25 (×3): 75 mg
  Filled 2023-08-24 (×3): qty 1

## 2023-08-24 MED ORDER — DOCUSATE SODIUM 50 MG/5ML PO LIQD: 100.0000 mg | Freq: Two times a day (BID) | ORAL | Status: DC

## 2023-08-24 MED ORDER — VITAL HIGH PROTEIN PO LIQD: 1000.0000 mL | ORAL | Status: DC

## 2023-08-24 MED ORDER — FENTANYL CITRATE PF 50 MCG/ML IJ SOSY: 50.0000 ug | PREFILLED_SYRINGE | Freq: Once | INTRAMUSCULAR | Status: DC

## 2023-08-24 MED ORDER — CLINDAMYCIN PHOSPHATE 600 MG/50ML IV SOLN
600.0000 mg | Freq: Three times a day (TID) | INTRAVENOUS | Status: DC
  Administered 2023-08-24: 600 mg via INTRAVENOUS
  Filled 2023-08-24 (×2): qty 50

## 2023-08-24 MED ORDER — CLINDAMYCIN PHOSPHATE 600 MG/50ML IV SOLN
600.0000 mg | Freq: Three times a day (TID) | INTRAVENOUS | Status: DC
  Administered 2023-08-24 (×2): 600 mg via INTRAVENOUS
  Filled 2023-08-24 (×3): qty 50

## 2023-08-24 MED ORDER — INSULIN ASPART 100 UNIT/ML IJ SOLN: 0.0000 [IU] | INTRAMUSCULAR | Status: DC

## 2023-08-24 MED ORDER — LEVALBUTEROL HCL 1.25 MG/0.5ML IN NEBU: 1.2500 mg | INHALATION_SOLUTION | Freq: Four times a day (QID) | RESPIRATORY_TRACT | Status: DC

## 2023-08-24 MED ORDER — NOREPINEPHRINE 4 MG/250ML-% IV SOLN
0.0000 ug/min | INTRAVENOUS | Status: DC
  Administered 2023-08-25 (×2): 2 ug/min via INTRAVENOUS
  Administered 2023-08-26: 5 ug/min via INTRAVENOUS
  Administered 2023-08-26: 8 ug/min via INTRAVENOUS
  Administered 2023-08-27: 2 ug/min via INTRAVENOUS
  Administered 2023-08-28: 11 ug/min via INTRAVENOUS
  Administered 2023-08-28: 8 ug/min via INTRAVENOUS
  Administered 2023-08-28 – 2023-08-29 (×3): 10 ug/min via INTRAVENOUS
  Administered 2023-08-29: 7 ug/min via INTRAVENOUS
  Administered 2023-08-30: 11 ug/min via INTRAVENOUS
  Administered 2023-08-31: 10 ug/min via INTRAVENOUS
  Administered 2023-08-31: 6 ug/min via INTRAVENOUS
  Administered 2023-09-01: 11 ug/min via INTRAVENOUS
  Administered 2023-09-01: 8 ug/min via INTRAVENOUS
  Administered 2023-09-01: 11 ug/min via INTRAVENOUS
  Administered 2023-09-01: 10 ug/min via INTRAVENOUS
  Administered 2023-09-02: 7 ug/min via INTRAVENOUS
  Administered 2023-09-02 – 2023-09-03 (×2): 9 ug/min via INTRAVENOUS
  Administered 2023-09-03: 5 ug/min via INTRAVENOUS
  Filled 2023-08-24 (×19): qty 250

## 2023-08-24 MED ORDER — NOREPINEPHRINE 4 MG/250ML-% IV SOLN: 0.0000 ug/min | INTRAVENOUS | Status: DC

## 2023-08-24 MED ORDER — LEVALBUTEROL HCL 0.63 MG/3ML IN NEBU
0.6300 mg | INHALATION_SOLUTION | Freq: Three times a day (TID) | RESPIRATORY_TRACT | Status: DC | PRN
  Administered 2023-08-31: 0.63 mg via RESPIRATORY_TRACT
  Filled 2023-08-24: qty 3

## 2023-08-24 MED ORDER — FENTANYL BOLUS VIA INFUSION
50.0000 ug | INTRAVENOUS | Status: DC | PRN
  Administered 2023-08-24 – 2023-08-25 (×6): 100 ug via INTRAVENOUS
  Administered 2023-08-25: 50 ug via INTRAVENOUS
  Administered 2023-08-25 – 2023-08-26 (×5): 100 ug via INTRAVENOUS
  Administered 2023-08-26: 75 ug via INTRAVENOUS
  Administered 2023-08-26 – 2023-08-27 (×13): 100 ug via INTRAVENOUS
  Administered 2023-08-28: 50 ug via INTRAVENOUS
  Administered 2023-08-28 (×2): 100 ug via INTRAVENOUS
  Administered 2023-08-28 (×3): 50 ug via INTRAVENOUS
  Administered 2023-08-29 – 2023-08-31 (×15): 100 ug via INTRAVENOUS
  Administered 2023-08-31: 50 ug via INTRAVENOUS
  Administered 2023-08-31 (×6): 100 ug via INTRAVENOUS
  Administered 2023-08-31: 50 ug via INTRAVENOUS

## 2023-08-24 MED ORDER — POLYETHYLENE GLYCOL 3350 17 G PO PACK: 17.0000 g | PACK | Freq: Every day | ORAL | Status: DC | PRN

## 2023-08-24 MED ORDER — VANCOMYCIN HCL IN DEXTROSE 1-5 GM/200ML-% IV SOLN: 1000.0000 mg | Freq: Two times a day (BID) | INTRAVENOUS | Status: DC

## 2023-08-24 MED ORDER — ALLOPURINOL 100 MG PO TABS: 100.0000 mg | ORAL_TABLET | Freq: Every day | ORAL | Status: DC

## 2023-08-24 MED ORDER — SODIUM CHLORIDE 0.9% FLUSH
10.0000 mL | Freq: Three times a day (TID) | INTRAVENOUS | Status: DC
  Administered 2023-08-24 – 2023-08-30 (×18): 10 mL via INTRAPLEURAL

## 2023-08-24 MED ORDER — MUPIROCIN 2 % EX OINT
1.0000 | TOPICAL_OINTMENT | Freq: Two times a day (BID) | CUTANEOUS | Status: AC
  Administered 2023-08-24 – 2023-08-29 (×10): 1 via NASAL
  Filled 2023-08-24 (×2): qty 22

## 2023-08-24 MED ORDER — POLYETHYLENE GLYCOL 3350 17 G PO PACK: 17.0000 g | PACK | Freq: Every day | ORAL | Status: DC

## 2023-08-24 MED ORDER — FENTANYL 2500MCG IN NS 250ML (10MCG/ML) PREMIX INFUSION
50.0000 ug/h | INTRAVENOUS | Status: DC
  Administered 2023-08-25: 200 ug/h via INTRAVENOUS
  Administered 2023-08-25 – 2023-08-26 (×2): 300 ug/h via INTRAVENOUS
  Administered 2023-08-26: 350 ug/h via INTRAVENOUS
  Administered 2023-08-26: 300 ug/h via INTRAVENOUS
  Administered 2023-08-27: 400 ug/h via INTRAVENOUS
  Administered 2023-08-27: 300 ug/h via INTRAVENOUS
  Administered 2023-08-27 – 2023-08-29 (×8): 400 ug/h via INTRAVENOUS
  Administered 2023-08-29: 200 ug/h via INTRAVENOUS
  Administered 2023-08-29 – 2023-08-30 (×2): 400 ug/h via INTRAVENOUS
  Administered 2023-08-30: 350 ug/h via INTRAVENOUS
  Administered 2023-08-30 – 2023-08-31 (×4): 400 ug/h via INTRAVENOUS
  Filled 2023-08-24 (×26): qty 250

## 2023-08-24 MED ORDER — VANCOMYCIN HCL IN DEXTROSE 1-5 GM/200ML-% IV SOLN
1000.0000 mg | Freq: Two times a day (BID) | INTRAVENOUS | Status: DC
  Administered 2023-08-25: 1000 mg via INTRAVENOUS
  Filled 2023-08-24: qty 200

## 2023-08-24 MED ORDER — ORAL CARE MOUTH RINSE: 15.0000 mL | OROMUCOSAL | Status: DC

## 2023-08-24 MED ORDER — DOCUSATE SODIUM 50 MG/5ML PO LIQD: 100.0000 mg | Freq: Two times a day (BID) | ORAL | Status: DC | PRN

## 2023-08-24 MED ORDER — PANTOPRAZOLE SODIUM 40 MG IV SOLR
40.0000 mg | Freq: Every day | INTRAVENOUS | Status: DC
  Filled 2023-08-24: qty 10

## 2023-08-24 MED ORDER — SODIUM CHLORIDE 0.9% FLUSH
10.0000 mL | Freq: Three times a day (TID) | INTRAVENOUS | Status: DC
  Administered 2023-08-24: 10 mL via INTRAPLEURAL

## 2023-08-24 MED ORDER — LEVALBUTEROL HCL 1.25 MG/0.5ML IN NEBU
1.2500 mg | INHALATION_SOLUTION | Freq: Four times a day (QID) | RESPIRATORY_TRACT | Status: DC
  Administered 2023-08-24: 1.25 mg via RESPIRATORY_TRACT
  Filled 2023-08-24: qty 0.5

## 2023-08-24 MED ORDER — HYDROCORTISONE SOD SUC (PF) 100 MG IJ SOLR: 100.0000 mg | Freq: Two times a day (BID) | INTRAMUSCULAR | Status: DC

## 2023-08-24 MED ORDER — SODIUM CHLORIDE (PF) 0.9 % IJ SOLN
5.0000 mg | Freq: Once | INTRAMUSCULAR | Status: AC
  Administered 2023-08-24: 5 mg via INTRAPLEURAL
  Filled 2023-08-24: qty 5

## 2023-08-24 MED ORDER — CHLORHEXIDINE GLUCONATE CLOTH 2 % EX PADS
6.0000 | MEDICATED_PAD | Freq: Every day | CUTANEOUS | Status: DC
  Administered 2023-08-24 – 2023-08-30 (×8): 6 via TOPICAL

## 2023-08-24 MED ORDER — POLYETHYLENE GLYCOL 3350 17 G PO PACK
17.0000 g | PACK | Freq: Every day | ORAL | Status: DC | PRN
  Administered 2023-08-26: 17 g
  Filled 2023-08-24: qty 1

## 2023-08-24 MED ORDER — AMIODARONE HCL 200 MG PO TABS: ORAL_TABLET | ORAL | Status: DC

## 2023-08-24 MED ORDER — SODIUM CHLORIDE 0.9% FLUSH
10.0000 mL | Freq: Two times a day (BID) | INTRAVENOUS | Status: DC
Start: 2023-08-24 — End: 2023-09-07

## 2023-08-24 MED ORDER — INSULIN ASPART 100 UNIT/ML IJ SOLN
0.0000 [IU] | INTRAMUSCULAR | Status: DC
  Administered 2023-08-24: 2 [IU] via SUBCUTANEOUS
  Administered 2023-08-24: 3 [IU] via SUBCUTANEOUS
  Administered 2023-08-25: 2 [IU] via SUBCUTANEOUS
  Administered 2023-08-25: 3 [IU] via SUBCUTANEOUS
  Administered 2023-08-25: 2 [IU] via SUBCUTANEOUS
  Administered 2023-08-25 – 2023-08-26 (×2): 3 [IU] via SUBCUTANEOUS
  Administered 2023-08-26: 5 [IU] via SUBCUTANEOUS
  Administered 2023-08-26: 1 [IU] via SUBCUTANEOUS
  Administered 2023-08-26 (×2): 3 [IU] via SUBCUTANEOUS
  Administered 2023-08-26: 2 [IU] via SUBCUTANEOUS
  Administered 2023-08-26 – 2023-08-27 (×2): 3 [IU] via SUBCUTANEOUS
  Administered 2023-08-27: 5 [IU] via SUBCUTANEOUS
  Administered 2023-08-27: 2 [IU] via SUBCUTANEOUS
  Administered 2023-08-27 – 2023-08-28 (×4): 3 [IU] via SUBCUTANEOUS
  Administered 2023-08-28: 5 [IU] via SUBCUTANEOUS

## 2023-08-24 MED ORDER — AMIODARONE HCL 200 MG PO TABS
200.0000 mg | ORAL_TABLET | Freq: Two times a day (BID) | ORAL | Status: DC
  Administered 2023-08-24 – 2023-09-06 (×26): 200 mg
  Filled 2023-08-24 (×26): qty 1

## 2023-08-24 MED ORDER — HEPARIN (PORCINE) 25000 UT/250ML-% IV SOLN
2250.0000 [IU]/h | INTRAVENOUS | Status: DC
  Administered 2023-08-24: 1700 [IU]/h via INTRAVENOUS
  Administered 2023-08-25: 2000 [IU]/h via INTRAVENOUS
  Administered 2023-08-25: 1850 [IU]/h via INTRAVENOUS
  Administered 2023-08-26: 2100 [IU]/h via INTRAVENOUS
  Filled 2023-08-24 (×6): qty 250

## 2023-08-24 MED ORDER — HYDROCORTISONE SOD SUC (PF) 100 MG IJ SOLR
100.0000 mg | Freq: Every day | INTRAMUSCULAR | Status: DC
  Administered 2023-08-25: 100 mg via INTRAVENOUS
  Filled 2023-08-24: qty 2

## 2023-08-24 MED ORDER — POLYETHYLENE GLYCOL 3350 17 G PO PACK
17.0000 g | PACK | Freq: Every day | ORAL | Status: DC | PRN
Start: 2023-08-24 — End: 2023-08-24

## 2023-08-24 MED ORDER — SODIUM CHLORIDE 0.9% FLUSH
10.0000 mL | INTRAVENOUS | Status: DC | PRN
Start: 2023-08-24 — End: 2023-09-07

## 2023-08-24 MED ORDER — FENTANYL CITRATE PF 50 MCG/ML IJ SOSY: 50.0000 ug | PREFILLED_SYRINGE | Freq: Once | INTRAMUSCULAR | Status: AC

## 2023-08-24 MED ORDER — INSULIN ASPART 100 UNIT/ML IJ SOLN: 3.0000 [IU] | INTRAMUSCULAR | Status: DC

## 2023-08-24 MED ORDER — FAMOTIDINE 20 MG PO TABS
20.0000 mg | ORAL_TABLET | Freq: Two times a day (BID) | ORAL | Status: DC
Start: 2023-08-24 — End: 2023-08-25
  Administered 2023-08-24: 20 mg
  Filled 2023-08-24 (×2): qty 1

## 2023-08-24 MED ORDER — STERILE WATER FOR INJECTION IJ SOLN
5.0000 mg | Freq: Once | RESPIRATORY_TRACT | Status: AC
  Administered 2023-08-24: 5 mg via INTRAPLEURAL
  Filled 2023-08-24: qty 5

## 2023-08-24 MED FILL — Fentanyl Citrate-NaCl 0.9% IV Soln 2.5 MG/250ML: INTRAVENOUS | Qty: 250 | Status: AC
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
## 2023-08-24 NOTE — Progress Notes (Signed)
Brief Progress Note   Informed pts daughter Aryssa Rosamond via telephone pt pending transfer to [facility] for [facility] once a bed becomes available.  All questions were answered.  Zada Girt, AGNP  Pulmonary/Critical Care Pager [phone] (please enter 7 digits) PCCM Consult Pager [phone] (please enter 7 digits)
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
## 2023-08-24 NOTE — Progress Notes (Incomplete)
DZ checkout

## 2023-08-24 NOTE — Progress Notes (Signed)
PHARMACY CONSULT NOTE - FOLLOW UP  Pharmacy Consult for Electrolyte Monitoring and Replacement   Recent Labs: Potassium (mmol/L)  Date Value  08/24/2023 5.0  08/24/2023 5.0  07/01/2014 4.2   Magnesium (mg/dL)  Date Value  16/04/9603 2.5 (H)  07/01/2014 1.9   Calcium (mg/dL)  Date Value  54/03/8118 8.8 (L)  08/24/2023 8.9   Calcium, Total (mg/dL)  Date Value  14/78/2956 8.9   Albumin (g/dL)  Date Value  21/30/8657 1.8 (L)  07/01/2014 3.5   Phosphorus (mg/dL)  Date Value  84/69/6295 2.7   Sodium (mmol/L)  Date Value  08/24/2023 145  08/24/2023 144  07/01/2014 140   Assessment: 63 y/o female with h/o EDP, IBS-C, bipolar, CAD, chronic cystitis, anxiety, MDD, GERD, gout, HLD, etoh abuse, NAFLD, chronic pain, COPD, RA and substance abuse who is admitted with flu A, pna and new Afib. Pharmacy is asked to follow and replace electrolytes while in CCU  Goal of Therapy:  Electrolytes WNL  Plan:  --- No replacement indicated at this time --- Recheck electrolytes in am  Effie Shy, PharmD Pharmacy Resident  08/24/2023 7:46 AM

## 2023-08-24 NOTE — Progress Notes (Signed)
eLink Physician-Brief Progress Note Patient Name: Kaylee [name] DOB: 1961/01/20 MRN: [id number]   Date of Service  08/24/2023  HPI/Events of Note  Request to review orders  eICU Interventions  PRN tylenol ordered for fever and mild pain PRN hydralazine for BP control Increased fentanyl ceiling and increased frequency of PRN fentanyl bolus per protocol     Intervention Category Minor Interventions: Routine modifications to care plan (e.g. PRN medications for pain, fever)  Kaylee [name] 08/24/2023, 9:15 PM

## 2023-08-24 NOTE — H&P (Signed)
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  Water For Irrigation, Sterile (FREE WATER) SOLN Place 30 mLs into feeding tube every 4 (four) hours. 08/24/23   Ezequiel Essex, NP     Critical care time: 50 mins    [facility] Pulmonary & Critical Care 08/24/2023, 4:43 PM  Please see [email or web address] for pager details.  From 7A-7P if no response, please call [phone]. After hours, please call ELink [phone].

## 2023-08-24 NOTE — Progress Notes (Signed)
ID I was consulted today to see this patient  but patient has been transferred ID has not seen this patient at [facility]- Please consult [facility] ID if needed

## 2023-08-24 NOTE — Progress Notes (Addendum)
Sedation had initially cut down by half at 8:16 am after speaking with NP Zada Girt. During trial the patient started to experience tugging when breathing, systolic blood pressure's above 200's and vent alarm going off. NP was notified and she has seen the patient at the bed side. NP has given verbal order to increase sedation back up to 100%. Propofol has been turned back to 50 mcg/kg/min. Dr. West Bali is aware that the patient's chest tube does not have any tidaling.

## 2023-08-24 NOTE — Progress Notes (Signed)
Brief Progress Note  Spoke CT surgery PA Gershon Crane at [facility] regarding pt transfer for VAT's with decortication.  Gershon Crane, [place] stated he would review case with CT Surgery and call back to inform the PCCM team if pt deemed appropriate for transfer.  Will continue to monitor and assess pt.  Zada Girt, AGNP  Pulmonary/Critical Care Pager [phone] (please enter 7 digits) PCCM Consult Pager [phone] (please enter 7 digits)
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
## 2023-08-24 NOTE — Plan of Care (Signed)
Problem: Clinical Measurements: Goal: Diagnostic test results will improve Outcome: Progressing Goal: Respiratory complications will improve Outcome: Progressing Goal: Cardiovascular complication will be avoided Outcome: Progressing   Problem: Nutrition: Goal: Adequate nutrition will be maintained Outcome: Progressing

## 2023-08-24 NOTE — Progress Notes (Signed)
This RN has provided nurse Anette Riedel with the patient's personal belongings which includes reading glasses and 2 metal rings. The patient's Daughter Nakkia listed in the chart has been notified about the transferred and informed that the patient will be transferred to [facility] cone 2 M room 05. RN will notify 40M nurse Reuel Boom when she leaves via [facility] and to please notify daughter when the patient has been settle in her new room.

## 2023-08-25 DIAGNOSIS — J850 Gangrene and necrosis of lung: Secondary | ICD-10-CM

## 2023-08-25 DIAGNOSIS — R6521 Severe sepsis with septic shock: Secondary | ICD-10-CM | POA: Diagnosis not present

## 2023-08-25 DIAGNOSIS — J15212 Pneumonia due to Methicillin resistant Staphylococcus aureus: Secondary | ICD-10-CM

## 2023-08-25 DIAGNOSIS — J9601 Acute respiratory failure with hypoxia: Secondary | ICD-10-CM | POA: Diagnosis not present

## 2023-08-25 DIAGNOSIS — J869 Pyothorax without fistula: Secondary | ICD-10-CM | POA: Diagnosis not present

## 2023-08-25 DIAGNOSIS — J09X2 Influenza due to identified novel influenza A virus with other respiratory manifestations: Secondary | ICD-10-CM | POA: Diagnosis not present

## 2023-08-25 DIAGNOSIS — A419 Sepsis, unspecified organism: Secondary | ICD-10-CM | POA: Diagnosis not present

## 2023-08-25 LAB — BASIC METABOLIC PANEL
Anion gap: 12 (ref 5–15)
BUN: 24 mg/dL — ABNORMAL HIGH (ref 8–23)
CO2: 31 mmol/L (ref 22–32)
Calcium: 9 mg/dL (ref 8.9–10.3)
Chloride: 104 mmol/L (ref 98–111)
Creatinine, Ser: 0.55 mg/dL (ref 0.44–1.00)
GFR, Estimated: >60 mL/min (ref >60)
Glucose, Bld: 129 mg/dL — ABNORMAL HIGH (ref 70–99)
Potassium: 4.3 mmol/L (ref 3.5–5.1)
Sodium: 147 mmol/L — ABNORMAL HIGH (ref 135–145)

## 2023-08-25 LAB — CBC
HCT: 31 % — ABNORMAL LOW (ref 36.0–46.0)
Hemoglobin: 10 g/dL — ABNORMAL LOW (ref 12.0–15.0)
MCH: 30.6 pg (ref 26.0–34.0)
MCHC: 32.3 g/dL (ref 30.0–36.0)
MCV: 94.8 fL (ref 80.0–100.0)
Platelets: 298 10*3/uL (ref 150–400)
RBC: 3.27 MIL/uL — ABNORMAL LOW (ref 3.87–5.11)
RDW: 14.3 % (ref 11.5–15.5)
WBC: 26.7 10*3/uL — ABNORMAL HIGH (ref 4.0–10.5)
nRBC: 0.1 % (ref 0.0–0.2)

## 2023-08-25 LAB — POCT I-STAT 7, (LYTES, BLD GAS, ICA,H+H)
Acid-Base Excess: 8 mmol/L — ABNORMAL HIGH (ref 0.0–2.0)
Bicarbonate: 32.3 mmol/L — ABNORMAL HIGH (ref 20.0–28.0)
Calcium, Ion: 1.22 mmol/L (ref 1.15–1.40)
HCT: 29 % — ABNORMAL LOW (ref 36.0–46.0)
Hemoglobin: 9.9 g/dL — ABNORMAL LOW (ref 12.0–15.0)
O2 Saturation: 93 %
Patient temperature: 37.6
Potassium: 4 mmol/L (ref 3.5–5.1)
Sodium: 148 mmol/L — ABNORMAL HIGH (ref 135–145)
TCO2: 34 mmol/L — ABNORMAL HIGH (ref 22–32)
pCO2 arterial: 46.9 mm[Hg] (ref 32–48)
pH, Arterial: 7.449 (ref 7.35–7.45)
pO2, Arterial: 67 mm[Hg] — ABNORMAL LOW (ref 83–108)

## 2023-08-25 LAB — HEPARIN LEVEL (UNFRACTIONATED)
Heparin Unfractionated: 0.23 [IU]/mL — ABNORMAL LOW (ref 0.30–0.70)
Heparin Unfractionated: 0.16 [IU]/mL — ABNORMAL LOW (ref 0.30–0.70)
Heparin Unfractionated: 0.26 [IU]/mL — ABNORMAL LOW (ref 0.30–0.70)
Heparin Unfractionated: 0.35 [IU]/mL (ref 0.30–0.70)

## 2023-08-25 LAB — GLUCOSE, CAPILLARY
Glucose-Capillary: 122 mg/dL — ABNORMAL HIGH (ref 70–99)
Glucose-Capillary: 125 mg/dL — ABNORMAL HIGH (ref 70–99)
Glucose-Capillary: 152 mg/dL — ABNORMAL HIGH (ref 70–99)
Glucose-Capillary: 196 mg/dL — ABNORMAL HIGH (ref 70–99)
Glucose-Capillary: 218 mg/dL — ABNORMAL HIGH (ref 70–99)
Glucose-Capillary: 94 mg/dL (ref 70–99)

## 2023-08-25 LAB — PHOSPHORUS: Phosphorus: 3 mg/dL (ref 2.5–4.6)

## 2023-08-25 LAB — MAGNESIUM: Magnesium: 2 mg/dL (ref 1.7–2.4)

## 2023-08-25 LAB — TRIGLYCERIDES: Triglycerides: 127 mg/dL (ref <150)

## 2023-08-25 LAB — CHOLESTEROL, BODY FLUID: Cholesterol, Fluid: 50 mg/dL

## 2023-08-25 MED ORDER — LINEZOLID 600 MG/300ML IV SOLN
600.0000 mg | Freq: Two times a day (BID) | INTRAVENOUS | Status: DC
  Administered 2023-08-25 – 2023-09-06 (×25): 600 mg via INTRAVENOUS
  Filled 2023-08-25 (×27): qty 300

## 2023-08-25 MED ORDER — FUROSEMIDE 10 MG/ML IJ SOLN
20.0000 mg | Freq: Once | INTRAMUSCULAR | Status: AC
  Administered 2023-08-25: 20 mg via INTRAVENOUS
  Filled 2023-08-25: qty 2

## 2023-08-25 MED ORDER — ARIPIPRAZOLE 5 MG PO TABS
5.0000 mg | ORAL_TABLET | Freq: Every day | ORAL | Status: DC
  Administered 2023-08-26 – 2023-09-06 (×12): 5 mg
  Filled 2023-08-25 (×13): qty 1

## 2023-08-25 MED ORDER — SODIUM CHLORIDE 3 % IN NEBU
4.0000 mL | INHALATION_SOLUTION | Freq: Two times a day (BID) | RESPIRATORY_TRACT | Status: AC
  Administered 2023-08-25 – 2023-08-27 (×6): 4 mL via RESPIRATORY_TRACT
  Filled 2023-08-25 (×6): qty 15

## 2023-08-25 MED ORDER — FUROSEMIDE 10 MG/ML IJ SOLN: 20.0000 mg | Freq: Once | INTRAMUSCULAR | Status: DC

## 2023-08-25 MED ORDER — PROSOURCE TF20 ENFIT COMPATIBL EN LIQD
60.0000 mL | Freq: Every day | ENTERAL | Status: DC
  Administered 2023-08-25 – 2023-08-31 (×7): 60 mL
  Filled 2023-08-25 (×7): qty 60

## 2023-08-25 MED ORDER — METOLAZONE 2.5 MG PO TABS
5.0000 mg | ORAL_TABLET | Freq: Once | ORAL | Status: AC
  Administered 2023-08-25: 5 mg
  Filled 2023-08-25: qty 2

## 2023-08-25 MED ORDER — VITAL AF 1.2 CAL PO LIQD
1000.0000 mL | ORAL | Status: DC
  Administered 2023-08-25 – 2023-08-30 (×7): 1000 mL

## 2023-08-25 MED ORDER — CLONAZEPAM 0.5 MG PO TABS
0.5000 mg | ORAL_TABLET | Freq: Two times a day (BID) | ORAL | Status: DC
  Administered 2023-08-25 – 2023-08-26 (×3): 0.5 mg
  Filled 2023-08-25 (×3): qty 1
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
## 2023-08-25 NOTE — Progress Notes (Signed)
Initial Nutrition Assessment  DOCUMENTATION CODES:  Not applicable  INTERVENTION:  Initiate tube feeding via OGT: Vital 1.2 at 50 ml/h (1200 ml per day) Prosource TF20 60 ml 1x/d Provides 1520 kcal, 110 gm protein, 973 ml free water daily TF+propofol = 2191 kcal/d  NUTRITION DIAGNOSIS:  Increased nutrient needs related to acute illness (flu A, PNA, ARDS) as evidenced by estimated needs.  GOAL:  Patient will meet greater than or equal to 90% of their needs  MONITOR:  TF tolerance, Vent status, Labs, Weight trends  REASON FOR ASSESSMENT:  Consult Enteral/tube feeding initiation and management  ASSESSMENT:  Pt with hx of IBS-C, GERD, HLD, CAD, gout, COPD, and polysubstance abuse (EtOH, amphetamines, cocaine), admitted to [facility] with Flu A. Developed atrial fibrillation and ARDS superimposed by necrotizing pneumonia and transferred to [facility] 2/11 for access to cardiothoracic surgery services  2/2 - presented to [facility] ED 2/6 - transferred to ICU, intubated 2/8 - right chest tube placed 2/11 - transferred to [facility]   Patient is currently intubated on ventilator support. No family in room at the time of assessment. Pt remains on high dose of propofol (has been consistent since [facility] admission), no longer on pressor support. Pt previously tolerating TF at goal rate, will resume. Discussed plan with RN.  MV: 13.1 L/min Temp (24hrs), Avg:100 F (37.8 C), Min:85.8 F (29.9 C), Max:101.3 F (38.5 C) MAP (Art Line):  Propofol: 25.4 ml/hr (671 kcal/d)  Admit weight: 82.1 kg (2/5, [facility])  Current weight: 86.4 kg   Intake/Output Summary (Last 24 hours) at 08/25/2023 1338 Last data filed at 08/25/2023 1100 Gross per 24 hour  Intake 1438.99 ml  Output 1595 ml  Net -156.01 ml  Net IO Since Admission: -156.01 mL [08/25/23 1338]  Drains/Lines: Art Line CVC Triple Lumen, Right IJ OGT 14 Fr. UOP 1325 mL x 24 hours FMS (placed 2/10) x 24 hours Chest Tube (right)  70mL x 24 hours  Nutritionally Relevant Medications: Scheduled Meds:  docusate  100 mg Per Tube BID   famotidine  20 mg Per Tube BID   hydrocortisone sod succinate   100 mg Intravenous Daily   insulin aspart  0-15 Units Subcutaneous Q4H   pantoprazole (PROTONIX) IV  40 mg Intravenous Daily   polyethylene glycol  17 g Per Tube Daily   Continuous Infusions:  clindamycin (CLEOCIN) IV Stopped (08/25/23 0028)   norepinephrine (LEVOPHED) Adult infusion 2 mcg/min (08/25/23 0649)   propofol (DIPRIVAN) infusion 40 mcg/kg/min (08/25/23 0649)   vancomycin Stopped (08/25/23 0325)   PRN Meds: docusate, polyethylene glycol  Labs Reviewed: Na 148 BUN 24 CBG ranges from 94-177 mg/dL over the last 24 hours HgbA1c 5.5%  NUTRITION - FOCUSED PHYSICAL EXAM: Flowsheet Row Most Recent Value  Orbital Region No depletion  Upper Arm Region No depletion  Thoracic and Lumbar Region No depletion  Buccal Region No depletion  Temple Region No depletion  Clavicle Bone Region No depletion  Clavicle and Acromion Bone Region Mild depletion  Scapular Bone Region Mild depletion  Dorsal Hand Unable to assess  [mittens]  Patellar Region Moderate depletion  Anterior Thigh Region Moderate depletion  Posterior Calf Region Moderate depletion  Edema (RD Assessment) Mild  [extremities]  Hair Reviewed  Eyes Reviewed  Mouth Unable to assess  [ETT holder blocking view]  Skin Reviewed  [previous graft sites noted to left leg]  Nails Unable to assess  [mittens]    Diet Order:   Diet Order  Diet NPO time specified  Diet effective now                   EDUCATION NEEDS:  Not appropriate for education at this time  Skin:  Skin Assessment: Reviewed RN Assessment  Last BM:  2/11 - type 7  Height:  Ht Readings from Last 1 Encounters:  08/24/23 5' 5.98" (1.676 m)    Weight:  Wt Readings from Last 1 Encounters:  08/25/23 86.4 kg    Ideal Body Weight:  59.1 kg  BMI:  Body mass index is  30.76 kg/m.  Estimated Nutritional Needs:  Kcal:  1900-2100 kcal/d Protein:  110-125 g/d Fluid:  2L/d    Greig Castilla, RD, LDN Registered Dietitian II Please reach out via secure chat Weekend on-call pager # available in [facility]
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
## 2023-08-25 NOTE — Progress Notes (Addendum)
5am ABG:  pH       7.45 Pco2   47 PO2    67 BE      8 HCO3 32  Values currently not transfering over to EPIC from I-STAT device
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
## 2023-08-25 NOTE — Consult Note (Addendum)
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 atherosclerosis. 9. Mild cardiomegaly. 10. Mildly prominent mediastinal and hilar lymph nodes, probably reactive. 11. New trace pericholecystic fluid but the gallbladder is not fully seen. Correlate clinically for coexisting cholecystitis. Ultrasound may be helpful if clinically warranted. 12. ETT and NGT in place. 13. These results will be called to the ordering clinician or representative by the Radiologist Assistant, and communication documented in the PACS or [employer].   Aortic Atherosclerosis (ICD10-I70.0).     Electronically Signed   By: Almira Bar M.D.   On: 08/22/2023 21:35   I have independently reviewed the above radiologic studies and discussed with the patient   Recent Lab Findings: Lab Results  Component Value Date   WBC 26.7 (H) 08/25/2023   HGB  9.9 (L) 08/25/2023   HCT 29.0 (L) 08/25/2023   PLT 298 08/25/2023   GLUCOSE 129 (H) 08/25/2023   CHOL 167 05/15/2020   TRIG 127 08/25/2023   HDL 48.30 05/15/2020   LDLDIRECT 94.0 05/15/2020   LDLCALC 100 (H) 03/27/2016   ALT 24 08/24/2023   AST 21 08/24/2023   NA 148 (H) 08/25/2023   K 4.0 08/25/2023   CL 104 08/25/2023   CREATININE 0.55 08/25/2023   BUN 24 (H) 08/25/2023   CO2 31 08/25/2023   TSH 0.215 (L) 08/19/2023   INR 1.2 08/19/2023   HGBA1C 5.5 08/19/2023      Assessment / Plan:      63 yo female with right hydropneumothorax suggestive of empyema after presenting with influenza A on 08/15/2023 that has deteriorated to acute hypoxic respiratory failure with bilateral MRSA pneumonia.  She is currently on vancomycin and clindamycin and has also been treated with steroids for the past few days likely contributing to the worsening leukocytosis.  She has had lytic therapy to the right pleural space x 3 cycles and currently has minimal drainage from the right pleural tube.  Dr. Dorris Fetch has reviewed the CT scan and discussed the patient with the pulmonary medicine team.  He recommends continued medical therapy with IV antibiotics and repeat of the chest CT in a day or 2.  Surgical intervention is not felt to be appropriate at this time but we will continue to follow and reassess.    Leary Roca, PA-C  08/25/2023 10:10 AM  Patient seen and examined, records reviewed, CT and CXR images reviewed. 63 yo woman with influenza complicated by MRSA pneumonia with VDRF.  Small loculated effusion on prior CT.  Severe pneumonia.  Minimal output from CT since transfer.  Bloody purulent secretions from ETT.  Do not see an indication for surgical intervention at present.   If remains febrile would repeat CT to see if there is need for additional trials of thrombolytics or placement of additional drains.  D/w Dr. Grace Bushy C. Dorris Fetch, MD [facility] [phone]

## 2023-08-25 NOTE — Plan of Care (Signed)
Signed by [facility] RN
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
## 2023-08-26 ENCOUNTER — Inpatient Hospital Stay (HOSPITAL_COMMUNITY): Payer: 59

## 2023-08-26 DIAGNOSIS — A419 Sepsis, unspecified organism: Secondary | ICD-10-CM | POA: Diagnosis not present

## 2023-08-26 DIAGNOSIS — J15212 Pneumonia due to Methicillin resistant Staphylococcus aureus: Secondary | ICD-10-CM | POA: Diagnosis not present

## 2023-08-26 DIAGNOSIS — J850 Gangrene and necrosis of lung: Secondary | ICD-10-CM | POA: Diagnosis not present

## 2023-08-26 DIAGNOSIS — R6521 Severe sepsis with septic shock: Secondary | ICD-10-CM | POA: Diagnosis not present

## 2023-08-26 LAB — CBC
HCT: 29.5 % — ABNORMAL LOW (ref 36.0–46.0)
Hemoglobin: 9.7 g/dL — ABNORMAL LOW (ref 12.0–15.0)
MCH: 30.5 pg (ref 26.0–34.0)
MCHC: 32.9 g/dL (ref 30.0–36.0)
MCV: 92.8 fL (ref 80.0–100.0)
Platelets: 355 10*3/uL (ref 150–400)
RBC: 3.18 MIL/uL — ABNORMAL LOW (ref 3.87–5.11)
RDW: 14.2 % (ref 11.5–15.5)
WBC: 23.1 10*3/uL — ABNORMAL HIGH (ref 4.0–10.5)
nRBC: 0.1 % (ref 0.0–0.2)

## 2023-08-26 LAB — BASIC METABOLIC PANEL
Anion gap: 9 (ref 5–15)
BUN: 26 mg/dL — ABNORMAL HIGH (ref 8–23)
CO2: 30 mmol/L (ref 22–32)
Calcium: 7.8 mg/dL — ABNORMAL LOW (ref 8.9–10.3)
Chloride: 101 mmol/L (ref 98–111)
Creatinine, Ser: 0.53 mg/dL (ref 0.44–1.00)
GFR, Estimated: >60 mL/min (ref >60)
Glucose, Bld: 173 mg/dL — ABNORMAL HIGH (ref 70–99)
Potassium: 3 mmol/L — ABNORMAL LOW (ref 3.5–5.1)
Sodium: 140 mmol/L (ref 135–145)

## 2023-08-26 LAB — GLUCOSE, CAPILLARY
Glucose-Capillary: 162 mg/dL — ABNORMAL HIGH (ref 70–99)
Glucose-Capillary: 174 mg/dL — ABNORMAL HIGH (ref 70–99)
Glucose-Capillary: 180 mg/dL — ABNORMAL HIGH (ref 70–99)
Glucose-Capillary: 125 mg/dL — ABNORMAL HIGH (ref 70–99)
Glucose-Capillary: 124 mg/dL — ABNORMAL HIGH (ref 70–99)
Glucose-Capillary: 155 mg/dL — ABNORMAL HIGH (ref 70–99)

## 2023-08-26 LAB — HEPARIN LEVEL (UNFRACTIONATED): Heparin Unfractionated: 0.3 [IU]/mL (ref 0.30–0.70)

## 2023-08-26 MED ORDER — IOHEXOL 350 MG/ML SOLN
50.0000 mL | Freq: Once | INTRAVENOUS | Status: AC | PRN
  Administered 2023-08-26: 50 mL via INTRAVENOUS

## 2023-08-26 MED ORDER — CLONAZEPAM 1 MG PO TABS
1.0000 mg | ORAL_TABLET | Freq: Two times a day (BID) | ORAL | Status: DC
  Administered 2023-08-26 – 2023-08-27 (×2): 1 mg
  Filled 2023-08-26 (×2): qty 1

## 2023-08-26 MED ORDER — POTASSIUM CHLORIDE 20 MEQ PO PACK
40.0000 meq | PACK | ORAL | Status: AC
  Administered 2023-08-26 (×2): 40 meq
  Filled 2023-08-26 (×2): qty 2

## 2023-08-26 MED ORDER — FUROSEMIDE 10 MG/ML IJ SOLN
20.0000 mg | Freq: Once | INTRAMUSCULAR | Status: AC
  Administered 2023-08-26: 20 mg via INTRAVENOUS
  Filled 2023-08-26: qty 2

## 2023-08-26 MED ORDER — ATORVASTATIN CALCIUM 40 MG PO TABS
80.0000 mg | ORAL_TABLET | Freq: Every day | ORAL | Status: DC
  Administered 2023-08-26 – 2023-09-06 (×12): 80 mg
  Filled 2023-08-26 (×12): qty 2

## 2023-08-26 MED ORDER — ASPIRIN 81 MG PO CHEW
81.0000 mg | CHEWABLE_TABLET | Freq: Every day | ORAL | Status: DC
  Administered 2023-08-26 – 2023-09-06 (×12): 81 mg
  Filled 2023-08-26 (×12): qty 1

## 2023-08-26 MED ORDER — METOLAZONE 2.5 MG PO TABS
5.0000 mg | ORAL_TABLET | Freq: Once | ORAL | Status: AC
  Administered 2023-08-26: 5 mg
  Filled 2023-08-26: qty 2

## 2023-08-26 NOTE — Progress Notes (Signed)
[facility] ADULT ICU REPLACEMENT PROTOCOL   The patient does apply for the [facility] Adult ICU Electrolyte Replacment Protocol based on the criteria listed below:   1.Exclusion criteria: TCTS, ECMO, Dialysis, and Myasthenia Gravis patients 2. Is GFR >/= 30 ml/min? Yes.    Patient's GFR today is >60 3. Is SCr </= 2? Yes.   Patient's SCr is 0.53 mg/dL 4. Did SCr increase >/= 0.5 in 24 hours? No. 5.Pt's weight >40kg  Yes.   6. Abnormal electrolyte(s): Potassium  7. Electrolytes replaced per protocol 8.  Call MD STAT for K+ </= 2.5, Phos </= 1, or Mag </= 1 Physician:  Dr. Faye Ramsay A Vitor Overbaugh 08/26/2023 4:06 AM

## 2023-08-26 NOTE — Progress Notes (Signed)
[facility]    Date of Admission:  08/24/2023   Total days of antibiotics 8   ID: Kaylee [name] is a 63 y.o. female with  post influenza severe necrotizing mrsa pneumonia with respiratory failure requiring intubation Principal Problem:   Septic shock (HCC)    Subjective: Afebrile. No pressor need. Still had high sedation needs last night, starting to wean today by 50%, vent FIO2 50%  Had repeat ct this morning - per my read shows progression of parenchyma disease, cavitary abscess bilaterally. Greater lesion with fluid level at RLL. Right PTX smaller. Bilateral effusion, more prominent on the left. Medications:   amiodarone  200 mg Per Tube BID   arformoterol  15 mcg Nebulization BID   ARIPiprazole  5 mg Per Tube Daily   aspirin  81 mg Per Tube Daily   atorvastatin  80 mg Per Tube Daily   Chlorhexidine Gluconate Cloth  6 each Topical Daily   clonazePAM  1 mg Per Tube BID   feeding supplement (PROSource TF20)  60 mL Per Tube Daily   insulin aspart  0-15 Units Subcutaneous Q4H   mupirocin ointment  1 Application Nasal BID   mouth rinse  15 mL Mouth Rinse Q2H   pantoprazole (PROTONIX) IV  40 mg Intravenous Daily   revefenacin  175 mcg Nebulization Daily   sodium chloride flush  10 mL Intrapleural Q8H   sodium chloride HYPERTONIC  4 mL Nebulization BID    Objective: Vital signs in last 24 hours: Temp:  [98.2 F (36.8 C)-100.4 F (38 C)] 99.7 F (37.6 C) (02/13 1445) Pulse Rate:  [67-108] 75 (02/13 1445) Resp:  [12-27] 24 (02/13 1445) SpO2:  [90 %-100 %] 94 % (02/13 1445) Arterial Line BP: (87-174)/(40-70) 112/51 (02/13 1445) FiO2 (%):  [40 %-50 %] 50 % (02/13 1517) Weight:  [85.1 kg] 85.1 kg (02/13 0423) Physical Exam  Constitutional:  oriented to person, place, and time. appears well-developed and well-nourished. No distress.  HENT: [place]/AT, PERRLA, no scleral icterus Mouth/Throat: Oropharynx is clear and moist. No oropharyngeal exudate.   Cardiovascular: Normal rate, regular rhythm and normal heart sounds. Exam reveals no gallop and no friction rub.  No murmur heard.  Pulmonary/Chest: Effort normal and breath sounds normal. No respiratory distress.  has no wheezes. Right chest tube 25mL collected over 24hr Neck = supple, no nuchal rigidity Abdominal: Soft. Bowel sounds are normal.  exhibits no distension. There is no tenderness.  Lymphadenopathy: no cervical adenopathy. No axillary adenopathy Neurological: alert and oriented to person, place, and time.  Skin: Skin is warm and dry. No rash noted. No erythema.  Psychiatric: a normal mood and affect.  behavior is normal.    Lab Results Recent Labs    08/25/23 0345 08/25/23 0449 08/26/23 0300  WBC 26.7*  --  23.1*  HGB 10.0* 9.9* 9.7*  HCT 31.0* 29.0* 29.5*  NA 147* 148* 140  K 4.3 4.0 3.0*  CL 104  --  101  CO2 31  --  30  BUN 24*  --  26*  CREATININE 0.55  --  0.53   Liver Panel Recent Labs    08/24/23 0413 08/24/23 0931  PROT  --  5.6*  ALBUMIN 1.8* 1.7*  AST  --  21  ALT  --  24  ALKPHOS  --  44  BILITOT  --  0.3  BILIDIR  --  <0.1  IBILI  --  NOT CALCULATED    Microbiology:  Methicillin resistant staphylococcus aureus      MIC    CIPROFLOXACIN >=8 RESISTANT Resistant    CLINDAMYCIN <=0.25 SENS... Sensitive    ERYTHROMYCIN >=8 RESISTANT Resistant    GENTAMICIN <=0.5 SENSI... Sensitive    Inducible Clindamycin NEGATIVE Sensitive    LINEZOLID 2 SENSITIVE Sensitive    OXACILLIN >=4 RESISTANT Resistant    RIFAMPIN <=0.5 SENSI... Sensitive    TETRACYCLINE <=1 SENSITIVE Sensitive    TRIMETH/SULFA >=320 RESIS... Resistant    VANCOMYCIN 1 SENSITIVE Sensitive     Studies/Results: CT CHEST W CONTRAST Result Date: 08/26/2023 CLINICAL DATA:  Right chest tube in place.  Empyema. EXAM: CT CHEST WITH CONTRAST TECHNIQUE: Multidetector CT imaging of the chest was performed during intravenous contrast administration. RADIATION DOSE REDUCTION: This  exam was performed according to the departmental dose-optimization program which includes automated exposure control, adjustment of the mA and/or kV according to patient size and/or use of iterative reconstruction technique. CONTRAST:  50mL OMNIPAQUE IOHEXOL 350 MG/ML SOLN COMPARISON:  Chest radiograph dated 08/24/2023. FINDINGS: Cardiovascular: There is no cardiomegaly or pericardial effusion. The thoracic aorta is unremarkable. The central pulmonary arteries appear patent. Right IJ central venous line with tip at the cavoatrial junction. Mediastinum/Nodes: Subcarinal adenopathy measures 13 mm short axis. Enteric tube in the esophagus. No significant mediastinal fluid collection. Lungs/Pleura: Small bilateral pleural effusions, left greater than right. Near complete consolidation of the lower lobes bilaterally and right middle lobe. Scattered bilateral upper lobe cavitary nodules. Overall similar in appearance to the prior CT. The right sided chest tube is in place. Significant decrease in the size of the right pneumothorax. Small right pneumothorax remains. Endotracheal tube approximately 4.5 cm above the carina. The central airways remain patent. Upper Abdomen: No acute abnormality. Musculoskeletal: Osteopenia with degenerative changes of the spine. No acute osseous pathology. IMPRESSION: 1. Decrease in the size of right pneumothorax. Minimal residual pneumothorax noted anterior to the right lung. 2. Small bilateral pleural effusions, left greater than right. 3. Consolidative changes of the lungs and cavitary nodules, relatively similar to prior CT. Electronically Signed   By: Elgie Collard M.D.   On: 08/26/2023 13:50   DG Chest Port 1 View Result Date: 08/24/2023 CLINICAL DATA:  Respiratory failure. EXAM: PORTABLE CHEST 1 VIEW COMPARISON:  Chest radiograph dated 08/24/2023. FINDINGS: Support apparatus in stable position. Similar appearance of bilateral lower lung field airspace opacities. No pneumothorax.  Stable cardiac silhouette. No acute osseous pathology. IMPRESSION: No significant interval change. Electronically Signed   By: Elgie Collard M.D.   On: 08/24/2023 17:19     Assessment/Plan: MRSA necrotizing pneumonia = continue on linezolid for the timebeing. As she gets back onto her psych medication, may need to convert to different agent  Mrsa colonization = continue with chg bathing and mupirocin protocol  Influenza = finished 10 day course of oseltamivir. Can stop further doses  Respiratory failure s/p vent = management per primary team  Continue on droplet isolation  [facility] Pager: [phone]  08/26/2023, 3:28 PM
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
## 2023-08-27 ENCOUNTER — Other Ambulatory Visit: Payer: Self-pay

## 2023-08-27 ENCOUNTER — Encounter (HOSPITAL_COMMUNITY): Payer: Self-pay | Admitting: Pulmonary Disease

## 2023-08-27 DIAGNOSIS — Z79899 Other long term (current) drug therapy: Secondary | ICD-10-CM

## 2023-08-27 DIAGNOSIS — J158 Pneumonia due to other specified bacteria: Secondary | ICD-10-CM

## 2023-08-27 DIAGNOSIS — B9562 Methicillin resistant Staphylococcus aureus infection as the cause of diseases classified elsewhere: Secondary | ICD-10-CM

## 2023-08-27 DIAGNOSIS — Z9911 Dependence on respirator [ventilator] status: Secondary | ICD-10-CM

## 2023-08-27 DIAGNOSIS — J09X2 Influenza due to identified novel influenza A virus with other respiratory manifestations: Secondary | ICD-10-CM

## 2023-08-27 DIAGNOSIS — A4102 Sepsis due to Methicillin resistant Staphylococcus aureus: Secondary | ICD-10-CM

## 2023-08-27 DIAGNOSIS — R6521 Severe sepsis with septic shock: Secondary | ICD-10-CM | POA: Diagnosis not present

## 2023-08-27 DIAGNOSIS — R0603 Acute respiratory distress: Secondary | ICD-10-CM | POA: Diagnosis not present

## 2023-08-27 LAB — BASIC METABOLIC PANEL
Anion gap: 9 (ref 5–15)
BUN: 18 mg/dL (ref 8–23)
CO2: 31 mmol/L (ref 22–32)
Calcium: 7.7 mg/dL — ABNORMAL LOW (ref 8.9–10.3)
Chloride: 97 mmol/L — ABNORMAL LOW (ref 98–111)
Creatinine, Ser: 0.52 mg/dL (ref 0.44–1.00)
GFR, Estimated: >60 mL/min (ref >60)
Glucose, Bld: 152 mg/dL — ABNORMAL HIGH (ref 70–99)
Potassium: 3 mmol/L — ABNORMAL LOW (ref 3.5–5.1)
Sodium: 137 mmol/L (ref 135–145)

## 2023-08-27 LAB — CBC
HCT: 30.4 % — ABNORMAL LOW (ref 36.0–46.0)
Hemoglobin: 10.1 g/dL — ABNORMAL LOW (ref 12.0–15.0)
MCH: 30.6 pg (ref 26.0–34.0)
MCHC: 33.2 g/dL (ref 30.0–36.0)
MCV: 92.1 fL (ref 80.0–100.0)
Platelets: 375 10*3/uL (ref 150–400)
RBC: 3.3 MIL/uL — ABNORMAL LOW (ref 3.87–5.11)
RDW: 13.8 % (ref 11.5–15.5)
WBC: 24.8 10*3/uL — ABNORMAL HIGH (ref 4.0–10.5)
nRBC: 0.1 % (ref 0.0–0.2)

## 2023-08-27 LAB — GLUCOSE, CAPILLARY
Glucose-Capillary: 136 mg/dL — ABNORMAL HIGH (ref 70–99)
Glucose-Capillary: 157 mg/dL — ABNORMAL HIGH (ref 70–99)
Glucose-Capillary: 172 mg/dL — ABNORMAL HIGH (ref 70–99)
Glucose-Capillary: 154 mg/dL — ABNORMAL HIGH (ref 70–99)
Glucose-Capillary: 113 mg/dL — ABNORMAL HIGH (ref 70–99)
Glucose-Capillary: 201 mg/dL — ABNORMAL HIGH (ref 70–99)

## 2023-08-27 LAB — HEPARIN LEVEL (UNFRACTIONATED): Heparin Unfractionated: 0.28 [IU]/mL — ABNORMAL LOW (ref 0.30–0.70)

## 2023-08-27 MED ORDER — POTASSIUM CHLORIDE 20 MEQ PO PACK
40.0000 meq | PACK | ORAL | Status: AC
  Administered 2023-08-27 (×2): 40 meq
  Filled 2023-08-27 (×2): qty 2

## 2023-08-27 MED ORDER — FUROSEMIDE 10 MG/ML IJ SOLN
20.0000 mg | Freq: Once | INTRAMUSCULAR | Status: AC
  Administered 2023-08-27: 20 mg via INTRAVENOUS
  Filled 2023-08-27: qty 2

## 2023-08-27 MED ORDER — MIDAZOLAM HCL 2 MG/2ML IJ SOLN
1.0000 mg | INTRAMUSCULAR | Status: DC | PRN
  Administered 2023-08-27 – 2023-09-02 (×19): 2 mg via INTRAVENOUS
  Filled 2023-08-27 (×21): qty 2

## 2023-08-27 MED ORDER — ACETAMINOPHEN 160 MG/5ML PO SOLN
1000.0000 mg | Freq: Three times a day (TID) | ORAL | Status: DC
  Administered 2023-08-27 – 2023-09-06 (×31): 1000 mg
  Filled 2023-08-27 (×31): qty 40.6

## 2023-08-27 MED ORDER — HEPARIN BOLUS VIA INFUSION
1200.0000 [IU] | Freq: Once | INTRAVENOUS | Status: AC
  Administered 2023-08-27: 1200 [IU] via INTRAVENOUS
  Filled 2023-08-27: qty 1200

## 2023-08-27 MED ORDER — ENOXAPARIN SODIUM 100 MG/ML IJ SOSY
1.0000 mg/kg | PREFILLED_SYRINGE | Freq: Two times a day (BID) | INTRAMUSCULAR | Status: DC
  Administered 2023-08-27 – 2023-08-30 (×8): 85 mg via SUBCUTANEOUS
  Filled 2023-08-27 (×9): qty 0.85

## 2023-08-27 MED ORDER — CLONAZEPAM 1 MG PO TABS
1.0000 mg | ORAL_TABLET | Freq: Three times a day (TID) | ORAL | Status: DC
  Administered 2023-08-27 – 2023-08-31 (×11): 1 mg
  Filled 2023-08-27 (×11): qty 1
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
## 2023-08-27 NOTE — Progress Notes (Signed)
Nutrition Follow-up  DOCUMENTATION CODES:  Not applicable  INTERVENTION:  Tube feeding via OGT: Vital 1.2 at 50 ml/h (1200 ml per day) Prosource TF20 60 ml 1x/d Provides 1520 kcal, 110 gm protein, 973 ml free water daily TF+propofol = 2191 kcal/d  NUTRITION DIAGNOSIS:  Increased nutrient needs related to acute illness (flu A, PNA, ARDS) as evidenced by estimated needs. - remains applicable  GOAL:  Patient will meet greater than or equal to 90% of their needs - progressing, being met with feeds at goal  MONITOR:  TF tolerance, Vent status, Labs, Weight trends  REASON FOR ASSESSMENT:  Consult Enteral/tube feeding initiation and management  ASSESSMENT:  Pt with hx of IBS-C, GERD, HLD, CAD, gout, COPD, and polysubstance abuse (EtOH, amphetamines, cocaine), admitted to [facility] with Flu A. Developed atrial fibrillation and ARDS superimposed by necrotizing pneumonia and transferred to [facility] 2/11 for access to cardiothoracic surgery services  2/2 - presented to [facility] ED 2/6 - transferred to ICU, intubated 2/8 - right chest tube placed 2/11 - transferred to [facility]   Pt resting in bed at the time of assessment. Remains intubated and sedated. No visitors present.   Discussed in rounds. Slowly improving but needs more time with antibiotics. Still has tick secretions being pulled from ETT. Will continue TF regimen as ordered. FMS placed 2/10, but no output recorded in the last 24 hours. Appears to be tolerating well.   MV: 14.3 L/min Temp (24hrs), Avg:99.8 F (37.7 C), Min:98.6 F (37 C), Max:100.4 F (38 C) MAP (Art Line):  Propofol: 25.4 ml/hr (671 kcal/d)  Admit weight: 82.1 kg (2/5, [facility])  Current weight: 85.2 kg   Intake/Output Summary (Last 24 hours) at 08/27/2023 0911 Last data filed at 08/27/2023 0800 Gross per 24 hour  Intake 4124.26 ml  Output 3150 ml  Net 974.26 ml  Net IO Since Admission: 1,636.07 mL [08/27/23 0911]  Drains/Lines: Art Line CVC  Triple Lumen, Right IJ OGT 14 Fr. UOP 2925 mL x 24 hours FMS (placed 2/10) 0mL x 24 hours Chest Tube (right) 0mL x 24 hours  Nutritionally Relevant Medications: Scheduled Meds:  atorvastatin  80 mg Per Tube Daily   feeding supplement (PROSource TF20)  60 mL Per Tube Daily   insulin aspart  0-15 Units Subcutaneous Q4H   pantoprazole (PROTONIX) IV  40 mg Intravenous Daily   potassium chloride  40 mEq Per Tube Q4H   Continuous Infusions:  feeding supplement (VITAL AF 1.2 CAL) 50 mL/hr at 08/27/23 0800   linezolid (ZYVOX) IV Stopped (08/26/23 2206)   norepinephrine (LEVOPHED) Adult infusion 3 mcg/min (08/27/23 0800)   propofol (DIPRIVAN) infusion 50 mcg/kg/min (08/27/23 0800)   PRN Meds: docusate, polyethylene glycol  Labs Reviewed: Na 148 BUN 24 CBG ranges from 94-177 mg/dL over the last 24 hours HgbA1c 5.5%  NUTRITION - FOCUSED PHYSICAL EXAM: Flowsheet Row Most Recent Value  Orbital Region No depletion  Upper Arm Region No depletion  Thoracic and Lumbar Region No depletion  Buccal Region No depletion  Temple Region No depletion  Clavicle Bone Region No depletion  Clavicle and Acromion Bone Region Mild depletion  Scapular Bone Region Mild depletion  Dorsal Hand Unable to assess  [mittens]  Patellar Region Moderate depletion  Anterior Thigh Region Moderate depletion  Posterior Calf Region Moderate depletion  Edema (RD Assessment) Mild  [extremities]  Hair Reviewed  Eyes Reviewed  Mouth Unable to assess  [ETT holder blocking view]  Skin Reviewed  [previous graft sites noted to left  leg]  Nails Unable to assess  [mittens]    Diet Order:   Diet Order             Diet NPO time specified  Diet effective now                   EDUCATION NEEDS:  Not appropriate for education at this time  Skin:  Skin Assessment: Reviewed RN Assessment  Last BM:  2/13 - type 7  Height:  Ht Readings from Last 1 Encounters:  08/24/23 5' 5.98" (1.676 m)    Weight:  Wt  Readings from Last 1 Encounters:  08/27/23 85.2 kg    Ideal Body Weight:  59.1 kg  BMI:  Body mass index is 30.33 kg/m.  Estimated Nutritional Needs:  Kcal:  1900-2100 kcal/d Protein:  110-125 g/d Fluid:  2L/d    Greig Castilla, RD, LDN Registered Dietitian II Please reach out via secure chat Weekend on-call pager # available in [facility]

## 2023-08-27 NOTE — Plan of Care (Signed)

## 2023-08-27 NOTE — TOC Progression Note (Signed)
Transition of Care [facility]) - Progression Note    Patient Details  Name: ANDRIEA HASEGAWA MRN: [id number] Date of Birth: Aug 05, 1960  Transition of [facility]) CM/SW Contact  Marliss Coots, LCSW Phone Number: 08/27/2023, 9:12 AM  Clinical Narrative:     9:12 AM Per chart review, patient remains on tube feeds and a ventilator.     Barriers to Discharge: Continued Medical Work up  Expected Discharge Plan and Services In-house Referral: Clinical Social Work     Living arrangements for the past 2 months: Single Family Home                                       Social Determinants of Health (SDOH) Interventions SDOH Screenings   Food Insecurity: No Food Insecurity (08/18/2023)  Housing: Unknown (08/18/2023)  Transportation Needs: Unmet Transportation Needs (08/18/2023)  Utilities: Not At Risk (08/18/2023)  Alcohol Screen: Low Risk  (11/28/2021)  Depression (PHQ2-9): Low Risk  (03/16/2023)  Financial Resource Strain: High Risk (11/11/2021)  Social Connections: Moderately Isolated (08/18/2023)  Stress: Stress Concern Present (11/11/2021)  Tobacco Use: Medium Risk (07/31/2023)   Received from [facility]    Readmission Risk Interventions     No data to display
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
## 2023-08-27 NOTE — Plan of Care (Signed)
  Problem: Nutrition: Goal: Adequate nutrition will be maintained Outcome: Progressing   Problem: Coping: Goal: Level of anxiety will decrease Outcome: Not Progressing

## 2023-08-27 NOTE — Progress Notes (Signed)
[facility]    Date of Admission:  08/24/2023   Total days of antibiotics since 2/6           ID: Kaylee [name] is a 63 y.o. female with  post influenza severe MRSA pneumonia and respiratory distress with vent needs Principal Problem:   Septic shock (HCC)    Subjective: Tmax up to 100.6; required to increase in sedation due to dysynchrony on vent. Little output of chest tube  Medications:   amiodarone  200 mg Per Tube BID   arformoterol  15 mcg Nebulization BID   ARIPiprazole  5 mg Per Tube Daily   aspirin  81 mg Per Tube Daily   atorvastatin  80 mg Per Tube Daily   Chlorhexidine Gluconate Cloth  6 each Topical Daily   clonazePAM  1 mg Per Tube BID   enoxaparin (LOVENOX) injection  1 mg/kg Subcutaneous Q12H   feeding supplement (PROSource TF20)  60 mL Per Tube Daily   insulin aspart  0-15 Units Subcutaneous Q4H   mupirocin ointment  1 Application Nasal BID   mouth rinse  15 mL Mouth Rinse Q2H   pantoprazole (PROTONIX) IV  40 mg Intravenous Daily   revefenacin  175 mcg Nebulization Daily   sodium chloride flush  10 mL Intrapleural Q8H   sodium chloride HYPERTONIC  4 mL Nebulization BID    Objective: Vital signs in last 24 hours: Temp:  [99.3 F (37.4 C)-100.4 F (38 C)] 100.2 F (37.9 C) (02/14 1215) Pulse Rate:  [75-105] 85 (02/14 1215) Resp:  [16-32] 25 (02/14 1215) SpO2:  [91 %-100 %] 95 % (02/14 1215) Arterial Line BP: (98-194)/(44-88) 162/88 (02/14 1215) FiO2 (%):  [40 %-50 %] 40 % (02/14 1100) Weight:  [85.2 kg] 85.2 kg (02/14 0441)  Physical Exam  Constitutional:  sedated. appears well-developed and well-nourished. No distress.  HENT: [place]/AT, OETT in place Mouth/Throat: Oropharynx is clear and moist. No oropharyngeal exudate.  Cardiovascular: Normal rate, regular rhythm and normal heart sounds. Exam reveals no gallop and no friction rub.  No murmur heard.  Pulmonary/Chest: Effort normal and breath sounds normal. No  respiratory distress.  has no wheezes.  Neck = supple, no nuchal rigidity Abdominal: Soft. Bowel sounds are normal.  exhibits no distension. There is no tenderness.  Lymphadenopathy: no cervical adenopathy. No axillary adenopathy Neurological: alert and oriented to person, place, and time.  Skin: Skin is warm and dry. No rash noted. No erythema. Slightly mottled left foot, cooler than right   Lab Results Recent Labs    08/26/23 0300 08/27/23 0314  WBC 23.1* 24.8*  HGB 9.7* 10.1*  HCT 29.5* 30.4*  NA 140 137  K 3.0* 3.0*  CL 101 97*  CO2 30 31  BUN 26* 18  CREATININE 0.53 0.52   Liver Panel No results for input(s): "PROT", "ALBUMIN", "AST", "ALT", "ALKPHOS", "BILITOT", "BILIDIR", "IBILI" in the last 72 hours. Sedimentation Rate No results for input(s): "ESRSEDRATE" in the last 72 hours. C-Reactive Protein No results for input(s): "CRP" in the last 72 hours.  Microbiology: Methicillin resistant staphylococcus aureus      MIC    CIPROFLOXACIN >=8 RESISTANT Resistant    CLINDAMYCIN <=0.25 SENS... Sensitive    ERYTHROMYCIN >=8 RESISTANT Resistant    GENTAMICIN <=0.5 SENSI... Sensitive    Inducible Clindamycin NEGATIVE Sensitive    LINEZOLID 2 SENSITIVE Sensitive    OXACILLIN >=4 RESISTANT Resistant    RIFAMPIN <=0.5 SENSI... Sensitive    TETRACYCLINE <=1  SENSITIVE Sensitive    TRIMETH/SULFA >=320 RESIS... Resistant    VANCOMYCIN 1 SENSITIVE Sensitive    Studies/Results: CT CHEST W CONTRAST Result Date: 08/26/2023 CLINICAL DATA:  Right chest tube in place.  Empyema. EXAM: CT CHEST WITH CONTRAST TECHNIQUE: Multidetector CT imaging of the chest was performed during intravenous contrast administration. RADIATION DOSE REDUCTION: This exam was performed according to the departmental dose-optimization program which includes automated exposure control, adjustment of the mA and/or kV according to patient size and/or use of iterative reconstruction technique. CONTRAST:  50mL OMNIPAQUE  IOHEXOL 350 MG/ML SOLN COMPARISON:  Chest radiograph dated 08/24/2023. FINDINGS: Cardiovascular: There is no cardiomegaly or pericardial effusion. The thoracic aorta is unremarkable. The central pulmonary arteries appear patent. Right IJ central venous line with tip at the cavoatrial junction. Mediastinum/Nodes: Subcarinal adenopathy measures 13 mm short axis. Enteric tube in the esophagus. No significant mediastinal fluid collection. Lungs/Pleura: Small bilateral pleural effusions, left greater than right. Near complete consolidation of the lower lobes bilaterally and right middle lobe. Scattered bilateral upper lobe cavitary nodules. Overall similar in appearance to the prior CT. The right sided chest tube is in place. Significant decrease in the size of the right pneumothorax. Small right pneumothorax remains. Endotracheal tube approximately 4.5 cm above the carina. The central airways remain patent. Upper Abdomen: No acute abnormality. Musculoskeletal: Osteopenia with degenerative changes of the spine. No acute osseous pathology. IMPRESSION: 1. Decrease in the size of right pneumothorax. Minimal residual pneumothorax noted anterior to the right lung. 2. Small bilateral pleural effusions, left greater than right. 3. Consolidative changes of the lungs and cavitary nodules, relatively similar to prior CT. Electronically Signed   By: Elgie Collard M.D.   On: 08/26/2023 13:50     Assessment/Plan: MRSA necrotizaing pneumonia = continue with linezolid.   Long term medicaiton/ side-effects of abtx = appears to continue to tolerate linezolid wihtout myelosyppression  Respiratory distress, vent dependence =defer to primary team for management and need for Chest tube  If she has ongoing fevers, consider repeat trach aspirate culture, but still suspect this is due to severe necrotizing pneumonia  Dr Zenaida Niece dam available over the weekend. We will see back on Monday  Continue on contact isolation for  mrsa  [facility] for Infectious Diseases Pager: [phone]  08/27/2023, 1:49 PM

## 2023-08-27 NOTE — Progress Notes (Signed)
[facility] ADULT ICU REPLACEMENT PROTOCOL   The patient does apply for the [facility] Adult ICU Electrolyte Replacment Protocol based on the criteria listed below:   1.Exclusion criteria: TCTS, ECMO, Dialysis, and Myasthenia Gravis patients 2. Is GFR >/= 30 ml/min? Yes.    Patient's GFR today is >60 3. Is SCr </= 2? Yes.   Patient's SCr is 0.52 mg/dL 4. Did SCr increase >/= 0.5 in 24 hours? No. 5.Pt's weight >40kg  Yes.   6. Abnormal electrolyte(s): Potassium  7. Electrolytes replaced per protocol 8.  Call MD STAT for K+ </= 2.5, Phos </= 1, or Mag </= 1 Physician:  Dr. Faye Ramsay A Lanay Zinda 08/27/2023 4:29 AM

## 2023-08-28 DIAGNOSIS — A4102 Sepsis due to Methicillin resistant Staphylococcus aureus: Secondary | ICD-10-CM | POA: Diagnosis not present

## 2023-08-28 DIAGNOSIS — J09X2 Influenza due to identified novel influenza A virus with other respiratory manifestations: Secondary | ICD-10-CM | POA: Diagnosis not present

## 2023-08-28 DIAGNOSIS — R6521 Severe sepsis with septic shock: Secondary | ICD-10-CM | POA: Diagnosis not present

## 2023-08-28 DIAGNOSIS — J9601 Acute respiratory failure with hypoxia: Secondary | ICD-10-CM | POA: Diagnosis not present

## 2023-08-28 LAB — BASIC METABOLIC PANEL
Anion gap: 10 (ref 5–15)
Anion gap: 10 (ref 5–15)
BUN: 17 mg/dL (ref 8–23)
BUN: 20 mg/dL (ref 8–23)
CO2: 29 mmol/L (ref 22–32)
CO2: 31 mmol/L (ref 22–32)
Calcium: 7.6 mg/dL — ABNORMAL LOW (ref 8.9–10.3)
Calcium: 7.7 mg/dL — ABNORMAL LOW (ref 8.9–10.3)
Chloride: 94 mmol/L — ABNORMAL LOW (ref 98–111)
Chloride: 94 mmol/L — ABNORMAL LOW (ref 98–111)
Creatinine, Ser: 0.53 mg/dL (ref 0.44–1.00)
Creatinine, Ser: 0.52 mg/dL (ref 0.44–1.00)
GFR, Estimated: >60 mL/min (ref >60)
GFR, Estimated: >60 mL/min (ref >60)
Glucose, Bld: 189 mg/dL — ABNORMAL HIGH (ref 70–99)
Glucose, Bld: 162 mg/dL — ABNORMAL HIGH (ref 70–99)
Potassium: 3 mmol/L — ABNORMAL LOW (ref 3.5–5.1)
Potassium: 3.1 mmol/L — ABNORMAL LOW (ref 3.5–5.1)
Sodium: 133 mmol/L — ABNORMAL LOW (ref 135–145)
Sodium: 135 mmol/L (ref 135–145)

## 2023-08-28 LAB — GLUCOSE, CAPILLARY
Glucose-Capillary: 170 mg/dL — ABNORMAL HIGH (ref 70–99)
Glucose-Capillary: 187 mg/dL — ABNORMAL HIGH (ref 70–99)
Glucose-Capillary: 223 mg/dL — ABNORMAL HIGH (ref 70–99)
Glucose-Capillary: 170 mg/dL — ABNORMAL HIGH (ref 70–99)
Glucose-Capillary: 142 mg/dL — ABNORMAL HIGH (ref 70–99)
Glucose-Capillary: 165 mg/dL — ABNORMAL HIGH (ref 70–99)

## 2023-08-28 LAB — HEPARIN LEVEL (UNFRACTIONATED): Heparin Unfractionated: <0.1 [IU]/mL — ABNORMAL LOW (ref 0.30–0.70)

## 2023-08-28 LAB — CBC
HCT: 27 % — ABNORMAL LOW (ref 36.0–46.0)
Hemoglobin: 8.9 g/dL — ABNORMAL LOW (ref 12.0–15.0)
MCH: 30.3 pg (ref 26.0–34.0)
MCHC: 33 g/dL (ref 30.0–36.0)
MCV: 91.8 fL (ref 80.0–100.0)
Platelets: 340 10*3/uL (ref 150–400)
RBC: 2.94 MIL/uL — ABNORMAL LOW (ref 3.87–5.11)
RDW: 13.6 % (ref 11.5–15.5)
WBC: 24.3 10*3/uL — ABNORMAL HIGH (ref 4.0–10.5)
nRBC: 0 % (ref 0.0–0.2)

## 2023-08-28 LAB — MAGNESIUM: Magnesium: 1.8 mg/dL (ref 1.7–2.4)

## 2023-08-28 LAB — TRIGLYCERIDES: Triglycerides: 121 mg/dL (ref <150)

## 2023-08-28 LAB — PHOSPHORUS: Phosphorus: 3.9 mg/dL (ref 2.5–4.6)

## 2023-08-28 MED ORDER — INSULIN ASPART 100 UNIT/ML IJ SOLN
0.0000 [IU] | INTRAMUSCULAR | Status: DC
  Administered 2023-08-28: 4 [IU] via SUBCUTANEOUS
  Administered 2023-08-28: 3 [IU] via SUBCUTANEOUS
  Administered 2023-08-28: 4 [IU] via SUBCUTANEOUS
  Administered 2023-08-29 (×3): 3 [IU] via SUBCUTANEOUS
  Administered 2023-08-29 (×3): 4 [IU] via SUBCUTANEOUS
  Administered 2023-08-30 – 2023-08-31 (×5): 3 [IU] via SUBCUTANEOUS
  Administered 2023-08-31: 4 [IU] via SUBCUTANEOUS
  Administered 2023-08-31 – 2023-09-01 (×2): 3 [IU] via SUBCUTANEOUS
  Administered 2023-09-01 (×3): 4 [IU] via SUBCUTANEOUS
  Administered 2023-09-01 – 2023-09-02 (×4): 3 [IU] via SUBCUTANEOUS
  Administered 2023-09-03: 4 [IU] via SUBCUTANEOUS
  Administered 2023-09-03 (×2): 3 [IU] via SUBCUTANEOUS
  Administered 2023-09-03: 4 [IU] via SUBCUTANEOUS
  Administered 2023-09-04 (×2): 3 [IU] via SUBCUTANEOUS
  Administered 2023-09-04: 4 [IU] via SUBCUTANEOUS
  Administered 2023-09-05: 1 [IU] via SUBCUTANEOUS
  Administered 2023-09-05 (×2): 3 [IU] via SUBCUTANEOUS
  Administered 2023-09-06: 2 [IU] via SUBCUTANEOUS

## 2023-08-28 MED ORDER — FUROSEMIDE 10 MG/ML IJ SOLN
40.0000 mg | Freq: Once | INTRAMUSCULAR | Status: AC
  Administered 2023-08-28: 40 mg via INTRAVENOUS
  Filled 2023-08-28: qty 4

## 2023-08-28 MED ORDER — POTASSIUM CHLORIDE 20 MEQ PO PACK
60.0000 meq | PACK | Freq: Two times a day (BID) | ORAL | Status: AC
  Administered 2023-08-28 (×2): 60 meq
  Filled 2023-08-28 (×2): qty 3

## 2023-08-28 MED ORDER — SODIUM CHLORIDE 3 % IN NEBU
4.0000 mL | INHALATION_SOLUTION | Freq: Two times a day (BID) | RESPIRATORY_TRACT | Status: AC
  Administered 2023-08-28 – 2023-08-30 (×6): 4 mL via RESPIRATORY_TRACT
  Filled 2023-08-28 (×6): qty 15

## 2023-08-28 MED ORDER — POTASSIUM CHLORIDE 20 MEQ PO PACK
40.0000 meq | PACK | Freq: Once | ORAL | Status: AC
  Administered 2023-08-28: 40 meq
  Filled 2023-08-28: qty 2

## 2023-08-28 MED ORDER — POTASSIUM CHLORIDE 20 MEQ PO PACK: 60.0000 meq | PACK | Freq: Two times a day (BID) | ORAL | Status: DC

## 2023-08-28 NOTE — Progress Notes (Signed)
 NAMEQIARA [name], MRN:  [id number], DOB:  March 09, 1961, LOS: 4 ADMISSION DATE:  08/24/2023, CONSULTATION DATE:  08/24/2023 REFERRING MD:  Janann Colonel, CHIEF COMPLAINT:  SOB   History of Present Illness:  37F smoker with history of COPD, anxiety, and bipolar disorder who was admitted to [facility] 2/3 for flu like symptoms with positive influenza A test complicated by MRSA pneumonia and empyema s/p chest tube 2/8 and pleural lytic therapy 2/8, 2/9, 2/11 with evidence of hydropneumthorax on CT imaging 2/9.    Patient transferred to [facility] 2/11 for further evaluation by CT surgery for possible VATs decortication. She is being treated with vancomycin and the addition of clindamycin.  Right chest tube is in place. No air leak. Minimal drainage   CT Surgery evaluated, no plan for VATs at this time. Repeat CT Chest shows improved right pneumothorax and minimal right effusion. There is small left effusion which is new. Dense bilateral lower lobe consolidations with areas of cavitation.  Pertinent  Medical History  bipolar disorder, ekborn's delusional parasitosis, general anxiety disorder, COPD, tobacco abuse, and chronic pain   Significant Hospital Events: Including procedures, antibiotic start and stop dates in addition to other pertinent events   02/2: Pt presented with influenza A and chronic ekbom's delusion parasitosis.  Psychiatry recommended inpatient psychiatric hospitalization, however due to influenza A diagnosis pt remained in the ER awaiting bed availability at another psychiatric facility 02/4: Pt required hospital admission to the telemetry unit per hospitalist team due to development of acute hypoxic respiratory failure secondary to influenza A with superimposed bilateral pneumonia/AECOPD and possible benzo withdrawal  02/5: Pt developed atrial fibrillation with rvr cardiology consulted recommended scheduled metoprolol and if pt remained in rvr could start cardizem gtt.   02/6: Due  to worsening acute hypoxic respiratory failure, agitation, and pt refusing Bipap pt transferred to the stepdown unit.  PCCM assumed care and precedex gtt initiated and pt placed on Bipap 2/7: compliant with ventilator, oxygen requirements improved. 02/8: oxygenation improved, bump in white count. Sedated and disoriented, does not follow commands. Right sided chest tube placed due to right para-mnemonic effusion.  TPA and dornase injected into pleural space utilizing existing pleural catheter  02/9: Pt remains mechanically intubated attempted WUA, however pt became dyssynchronous with desaturation requiring increased O2 requirements requiring vecuronium.  Right-sided chest tube remains in place pleural TPA/dornase given  02/10: Pt remains mechanically intubated vent settings: PEEP 10/FiO2 60%.   Will perform WUA and wean PEEP/FiO2 as tolerated.  Chest tube output overnight 50 ml.  Dornase injected into pleural space in pleural catheter 02/11: Pt remains mechanically intubated current settings PEEP 10/FiO2 increased from 50% to 60%.  No chest tube output overnight will administer TPA/dornase today.  Clindamycin added today  Significant Events from (2/2 to 2/11) recorded at [facility]    02/12: Remained intubated. Started on Klonopin 0.5 mg BID. Stop Clindamycin, ID consulted. Stopped vancomycin. Started on IV Linezolid 600 mg.  02/13: CT Chest: decrease in size of R pneumothorax. Small bilateral pleural effusions L>R. Consolidative changes.  02/14: Stop IV heparin. Start SubQ Lovenox BID   Interim History / Subjective:   No acute events overnight Remains intubated, agitated intermittently Tmax 100.20F   Objective   Blood pressure 116/63, pulse 81, temperature 99.9 F (37.7 C), resp. rate (!) 32, height 5' 5.98" (1.676 m), weight 86 kg, SpO2 95%.    Vent Mode: PRVC FiO2 (%):  [40 %] 40 % Set Rate:  [24 bmp-25 bmp] 25 bmp  Vt Set:  [470 mL] 470 mL PEEP:  [8 cmH20] 8 cmH20 Plateau Pressure:  [20  cmH20-26 cmH20] 21 cmH20   Intake/Output Summary (Last 24 hours) at 08/28/2023 0740 Last data filed at 08/28/2023 0730 Gross per 24 hour  Intake 4414.57 ml  Output 3060 ml  Net 1354.57 ml   Filed Weights   08/26/23 0423 08/27/23 0441 08/28/23 0418  Weight: 85.1 kg 85.2 kg 86 kg    Examination: General: appears critically ill, intubated and on vent  HENT:  ETT and NGT  Lungs: improving aeration, no wheezing  Cardiovascular: RR. No murmur.  Abdomen: soft, ND. Bowel sounds present  Extremities: 1+ pitting edema b/l LE  Neuro: PERRL, sedated GU: foley catheter   Pertinent Imaging:  2/3 CXR showed no active disease  2/4 CXR showed Confluent new right greater than left lung base opacity, New right pleural effusion not excluded  2/6 CXR: Patchy consolidation in the right greater than left mid to lower lung fields and small pleural effusions.  2/6 Abd xray: Dilated loops of small and large bowel, suggestive of ileus.  2/8 CXR: Unchanged bibasilar airspace opacities, greater on the right, likely due to combination of pneumonia and small pleural effusions 2/9 CXR: Patchy bibasilar lung consolidation, right greater than left, slightly worsened on the right. Probable stable small bilateral pleural effusions  2/9 CT chest: Right hydropneumothorax with a small pneumo component anteriorly, and a separate hydropneumothorax which could be loculated, posterior and medial to the right lower lobe. Extensive consolidative airspace disease extending from the hila into the bilateral lower lobe basal segments and into the right middle lobe lateral segment more so than the medial segment. 2/10 CXR: Improved aeration in the right lung base with persistent opacity seen. 2/11 CXR: Similar appearance of bilateral lower lung field airspace opacities. 2/13 CT chest: Decrease in the size of right pneumothorax. Minimal residual pneumothorax noted anterior to the right lung. Small bilateral pleural effusions, left  greater than right. Consolidative changes of the lungs and cavitary nodules, relatively similar to prior CT.  Resolved Hospital Problem list   N/A  Assessment & Plan:  Acute hypoxic respiratory failure 2/2 influenza  Septic Shock 2/2 MRSA necrotizing pneumonia and empyema s/p chest tube placement Leukocytosis  Presented to [facility] w/ flu like symptoms, found to have influenza A+, developed acute respiratory failure. CXR revealing evidence of bilateral pneumonia along with possible right sided pleural effusion on 2/4. Course complicated with developing afib RVR and was intubated on 2/6 for respiratory distress secondary to necrotizing MRSA pneumonia with development of empyema. Chest tube was placed on 2/8 w/ administrations of lytic therapies. CT chest 2/9 showed hydropneumothorax with significant consolidative airspace disease. Transferred to [facility] for CTS evaluation for possible VATS. CT surgery evaluated and recommend continue medical therapy; no surgical intervention at this time. S/p two doses of Solu Cortef 100 mg 2/11 and 2/12.  Spiked a fever 100.4, WBC 24.8; Bedside US of the L chest showed small simple pleural effusion without any loculations.    - Continue SAT/SBT  - Continue IV Linezolid 600 mg q12h  - Klonopin 1 mg TID  - Tylenol 1000 mg q8h  - PRN Versed 1-2 mg q1h  - Continue Propofol, Levophed and fentanyl  - IV lasix 40 mg today   COPD Exacerbation  - Continue nebulizer treatments: Brovana, yupelri, levalbuterol  - Hypertonic saline nebulization BID  - Continue O2 sat goal of >90%    New onset Afib with RVR  Currently NSR.  -  Continue subQ Lovenox 85 mg BID - Continue Amiodarone 200 mg BID  - Aspirin 81 mg and Lipitor 80 mg daily   Ekbom's Delusional Parasitosis  Substance abuse psychosis- UDS on admit + for amphetamines  Bipolar 1 disorder hx  Possible benzo withdrawal  GAD hx P: Continue Abilify 5 mg daily  Once patient is extubated, re-consult psych for assist in  management  Continue to wean sedation as tolerated  Restart celexa when appropriate     Normocytic Anemia Hgb has been within the ~10-11 since hospitalization. Will continue to monitor.    HTN PRN Hydralazine 10 mg q6h for SBP > 170 or DBP > 110   Hypokalemia Replete and recheck   Best Practice (right click and "Reselect all SmartList Selections" daily)   Diet/type: tubefeeds DVT prophylaxis Lovenox  Pressure ulcer(s): Assessed per RN  GI prophylaxis: PPI Lines: RIJ, art line, NG, R chest tube  Foley:  Yes, and it is still needed Code Status:  DNR Last date of multidisciplinary goals of care discussion [--]  Labs   CBC: Recent Labs  Lab 08/24/23 0413 08/24/23 1638 08/25/23 0345 08/25/23 0449 08/26/23 0300 08/27/23 0314 08/28/23 0600  WBC 25.0*  --  26.7*  --  23.1* 24.8* 24.3*  HGB 10.5*   < > 10.0* 9.9* 9.7* 10.1* 8.9*  HCT 32.9*   < > 31.0* 29.0* 29.5* 30.4* 27.0*  MCV 97.1  --  94.8  --  92.8 92.1 91.8  PLT 248  --  298  --  355 375 340   < > = values in this interval not displayed.    Basic Metabolic Panel: Recent Labs  Lab 08/22/23 0419 08/23/23 0439 08/23/23 0909 08/24/23 0413 08/24/23 1638 08/25/23 0345 08/25/23 0449 08/26/23 0300 08/27/23 0314 08/28/23 0600  NA 139 142   < > 145  144   < > 147* 148* 140 137 133*  K 5.1 5.4*   < > 5.0  5.0   < > 4.3 4.0 3.0* 3.0* 3.0*  CL 105 106   < > 108  107  --  104  --  101 97* 94*  CO2 26 29   < > 30  30  --  31  --  30 31 29   GLUCOSE 215* 255*   < > 185*  179*  --  129*  --  173* 152* 189*  BUN 32* 39*   < > 34*  34*  --  24*  --  26* 18 17  CREATININE 0.70 0.66   < > 0.48  0.50  --  0.55  --  0.53 0.52 0.53  CALCIUM 9.1 8.6*   < > 8.8*  8.9  --  9.0  --  7.8* 7.7* 7.6*  MG 2.5* 2.5*  --  2.5*  --  2.0  --   --   --  1.8  PHOS 1.8* 3.7  --  2.7  --  3.0  --   --   --  3.9   < > = values in this interval not displayed.   GFR: Estimated Creatinine Clearance: 79.5 mL/min (by C-G formula based on  SCr of 0.53 mg/dL). Recent Labs  Lab 08/23/23 0909 08/24/23 0413 08/25/23 0345 08/26/23 0300 08/27/23 0314 08/28/23 0600  PROCALCITON 2.89  --   --   --   --   --   WBC  --    < > 26.7* 23.1* 24.8* 24.3*   < > = values in this  interval not displayed.    Liver Function Tests: Recent Labs  Lab 08/23/23 0439 08/24/23 0413 08/24/23 0931  AST  --   --  21  ALT  --   --  24  ALKPHOS  --   --  44  BILITOT  --   --  0.3  PROT  --   --  5.6*  ALBUMIN 1.9* 1.8* 1.7*   No results for input(s): "LIPASE", "AMYLASE" in the last 168 hours. No results for input(s): "AMMONIA" in the last 168 hours.  ABG    Component Value Date/Time   PHART 7.449 08/25/2023 0449   PCO2ART 46.9 08/25/2023 0449   PO2ART 67 (L) 08/25/2023 0449   HCO3 32.3 (H) 08/25/2023 0449   TCO2 34 (H) 08/25/2023 0449   ACIDBASEDEF 7.4 (H) 08/19/2023 1508   O2SAT 93 08/25/2023 0449     Coagulation Profile: No results for input(s): "INR", "PROTIME" in the last 168 hours.  Cardiac Enzymes: No results for input(s): "CKTOTAL", "CKMB", "CKMBINDEX", "TROPONINI" in the last 168 hours.  HbA1C: Hemoglobin A1C  Date/Time Value Ref Range Status  03/08/2014 04:32 AM 5.3 4.2 - 6.3 % Final    Comment:    The American Diabetes Association recommends that a primary goal of therapy should be <7% and that physicians should reevaluate the treatment regimen in patients with HbA1c values consistently >8%.    Hgb A1c MFr Bld  Date/Time Value Ref Range Status  08/19/2023 03:26 PM 5.5 4.8 - 5.6 % Final    Comment:    (NOTE) Pre diabetes:          5.7%-6.4%  Diabetes:              >6.4%  Glycemic control for   <7.0% adults with diabetes   03/24/2019 08:24 AM 5.7 4.6 - 6.5 % Final    Comment:    Glycemic Control Guidelines for People with Diabetes:Non Diabetic:  <6%Goal of Therapy: <7%Additional Action Suggested:  >8%     CBG: Recent Labs  Lab 08/27/23 1550 08/27/23 1915 08/27/23 2321 08/28/23 0255 08/28/23 0710   GLUCAP 154* 113* 201* 170* 187*       Critical care time: 35 minutes     Melody Comas, MD [facility] Office: [phone]   See Amion for personal pager PCCM on call pager [phone] until 7pm. Please call Elink 7p-7a. [phone]

## 2023-08-28 NOTE — Plan of Care (Signed)

## 2023-08-28 NOTE — Plan of Care (Signed)
  Problem: Clinical Measurements: Goal: Diagnostic test results will improve Outcome: Progressing Goal: Cardiovascular complication will be avoided Outcome: Progressing   Problem: Activity: Goal: Risk for activity intolerance will decrease Outcome: Progressing   Problem: Nutrition: Goal: Adequate nutrition will be maintained Outcome: Progressing

## 2023-08-29 ENCOUNTER — Encounter (HOSPITAL_COMMUNITY): Payer: Self-pay | Admitting: Pulmonary Disease

## 2023-08-29 DIAGNOSIS — R6521 Severe sepsis with septic shock: Secondary | ICD-10-CM | POA: Diagnosis not present

## 2023-08-29 DIAGNOSIS — A6000 Herpesviral infection of urogenital system, unspecified: Secondary | ICD-10-CM

## 2023-08-29 DIAGNOSIS — B3731 Acute candidiasis of vulva and vagina: Secondary | ICD-10-CM

## 2023-08-29 DIAGNOSIS — J09X2 Influenza due to identified novel influenza A virus with other respiratory manifestations: Secondary | ICD-10-CM | POA: Diagnosis not present

## 2023-08-29 DIAGNOSIS — A4102 Sepsis due to Methicillin resistant Staphylococcus aureus: Secondary | ICD-10-CM | POA: Diagnosis not present

## 2023-08-29 DIAGNOSIS — J9601 Acute respiratory failure with hypoxia: Secondary | ICD-10-CM | POA: Diagnosis not present

## 2023-08-29 DIAGNOSIS — J90 Pleural effusion, not elsewhere classified: Secondary | ICD-10-CM

## 2023-08-29 DIAGNOSIS — J939 Pneumothorax, unspecified: Secondary | ICD-10-CM

## 2023-08-29 LAB — GLUCOSE, CAPILLARY
Glucose-Capillary: 134 mg/dL — ABNORMAL HIGH (ref 70–99)
Glucose-Capillary: 143 mg/dL — ABNORMAL HIGH (ref 70–99)
Glucose-Capillary: 190 mg/dL — ABNORMAL HIGH (ref 70–99)
Glucose-Capillary: 122 mg/dL — ABNORMAL HIGH (ref 70–99)
Glucose-Capillary: 163 mg/dL — ABNORMAL HIGH (ref 70–99)
Glucose-Capillary: 154 mg/dL — ABNORMAL HIGH (ref 70–99)

## 2023-08-29 LAB — CBC
HCT: 22.9 % — ABNORMAL LOW (ref 36.0–46.0)
Hemoglobin: 7.5 g/dL — ABNORMAL LOW (ref 12.0–15.0)
MCH: 30.6 pg (ref 26.0–34.0)
MCHC: 32.8 g/dL (ref 30.0–36.0)
MCV: 93.5 fL (ref 80.0–100.0)
Platelets: 245 10*3/uL (ref 150–400)
RBC: 2.45 MIL/uL — ABNORMAL LOW (ref 3.87–5.11)
RDW: 13.4 % (ref 11.5–15.5)
WBC: 15.4 10*3/uL — ABNORMAL HIGH (ref 4.0–10.5)
nRBC: 0 % (ref 0.0–0.2)

## 2023-08-29 LAB — BASIC METABOLIC PANEL
Anion gap: 10 (ref 5–15)
BUN: 16 mg/dL (ref 8–23)
CO2: 28 mmol/L (ref 22–32)
Calcium: 7.7 mg/dL — ABNORMAL LOW (ref 8.9–10.3)
Chloride: 97 mmol/L — ABNORMAL LOW (ref 98–111)
Creatinine, Ser: 0.54 mg/dL (ref 0.44–1.00)
GFR, Estimated: >60 mL/min (ref >60)
Glucose, Bld: 176 mg/dL — ABNORMAL HIGH (ref 70–99)
Potassium: 3.8 mmol/L (ref 3.5–5.1)
Sodium: 135 mmol/L (ref 135–145)

## 2023-08-29 LAB — PHOSPHORUS: Phosphorus: 3.2 mg/dL (ref 2.5–4.6)

## 2023-08-29 LAB — MAGNESIUM: Magnesium: 1.9 mg/dL (ref 1.7–2.4)

## 2023-08-29 MED ORDER — POTASSIUM CHLORIDE 20 MEQ PO PACK
40.0000 meq | PACK | Freq: Two times a day (BID) | ORAL | Status: AC
Start: 2023-08-29 — End: 2023-08-29
  Administered 2023-08-29 (×2): 40 meq
  Filled 2023-08-29 (×2): qty 2

## 2023-08-29 MED ORDER — FUROSEMIDE 10 MG/ML IJ SOLN
20.0000 mg | Freq: Once | INTRAMUSCULAR | Status: AC
  Administered 2023-08-29: 20 mg via INTRAVENOUS
  Filled 2023-08-29: qty 2

## 2023-08-29 MED ORDER — FUROSEMIDE 10 MG/ML IJ SOLN
40.0000 mg | Freq: Once | INTRAMUSCULAR | Status: AC
  Administered 2023-08-29: 40 mg via INTRAVENOUS
  Filled 2023-08-29: qty 4

## 2023-08-29 MED ORDER — VALACYCLOVIR 50 MG/ML ORAL SUSPENSION: 500.0000 mg | Freq: Two times a day (BID) | ORAL | Status: DC

## 2023-08-29 MED ORDER — OXYCODONE HCL 5 MG PO TABS
10.0000 mg | ORAL_TABLET | Freq: Three times a day (TID) | ORAL | Status: DC
  Administered 2023-08-29 – 2023-08-31 (×7): 10 mg
  Filled 2023-08-29 (×7): qty 2

## 2023-08-29 MED ORDER — NYSTATIN 100000 UNIT/GM EX POWD
Freq: Two times a day (BID) | CUTANEOUS | Status: AC
  Administered 2023-09-02: 1 via TOPICAL
  Filled 2023-08-29: qty 15

## 2023-08-29 MED ORDER — VALACYCLOVIR HCL 500 MG PO TABS
500.0000 mg | ORAL_TABLET | Freq: Two times a day (BID) | ORAL | Status: DC
  Administered 2023-08-29 – 2023-08-30 (×3): 500 mg
  Filled 2023-08-29 (×4): qty 1

## 2023-08-29 NOTE — Plan of Care (Signed)
   Problem: Education: Goal: Knowledge of General Education information will improve Description: Including pain rating scale, medication(s)/side effects and non-pharmacologic comfort measures Outcome: Progressing   Problem: Clinical Measurements: Goal: Ability to maintain clinical measurements within normal limits will improve Outcome: Progressing Goal: Will remain free from infection Outcome: Progressing

## 2023-08-29 NOTE — Plan of Care (Signed)

## 2023-08-29 NOTE — Plan of Care (Signed)

## 2023-08-29 NOTE — Progress Notes (Addendum)
 Kaylee [name], MRN:  [id number], DOB:  11-13-1960, LOS: 5 ADMISSION DATE:  08/24/2023, CONSULTATION DATE:  08/24/2023 REFERRING MD:  Janann Colonel, CHIEF COMPLAINT:  SOB   History of Present Illness:  65F smoker with history of COPD, anxiety, and bipolar disorder who was admitted to [facility] 2/3 for flu like symptoms with positive influenza A test complicated by MRSA pneumonia and empyema s/p chest tube 2/8 and pleural lytic therapy 2/8, 2/9, 2/11 with evidence of hydropneumthorax on CT imaging 2/9.    Patient transferred to [facility] 2/11 for further evaluation by CT surgery for possible VATs decortication. She is being treated with vancomycin and the addition of clindamycin.  Right chest tube is in place. No air leak. Minimal drainage   CT Surgery evaluated, no plan for VATs at this time. Repeat CT Chest shows improved right pneumothorax and minimal right effusion. There is small left effusion which is new. Dense bilateral lower lobe consolidations with areas of cavitation.  Pertinent  Medical History  bipolar disorder, ekborn's delusional parasitosis, general anxiety disorder, COPD, tobacco abuse, and chronic pain   Significant Hospital Events: Including procedures, antibiotic start and stop dates in addition to other pertinent events   02/2: Pt presented with influenza A and chronic ekbom's delusion parasitosis.  Psychiatry recommended inpatient psychiatric hospitalization, however due to influenza A diagnosis pt remained in the ER awaiting bed availability at another psychiatric facility 02/4: Pt required hospital admission to the telemetry unit per hospitalist team due to development of acute hypoxic respiratory failure secondary to influenza A with superimposed bilateral pneumonia/AECOPD and possible benzo withdrawal  02/5: Pt developed atrial fibrillation with rvr cardiology consulted recommended scheduled metoprolol and if pt remained in rvr could start cardizem gtt.   02/6: Due  to worsening acute hypoxic respiratory failure, agitation, and pt refusing Bipap pt transferred to the stepdown unit.  PCCM assumed care and precedex gtt initiated and pt placed on Bipap 2/7: compliant with ventilator, oxygen requirements improved. 02/8: oxygenation improved, bump in white count. Sedated and disoriented, does not follow commands. Right sided chest tube placed due to right para-mnemonic effusion.  TPA and dornase injected into pleural space utilizing existing pleural catheter  02/9: Pt remains mechanically intubated attempted WUA, however pt became dyssynchronous with desaturation requiring increased O2 requirements requiring vecuronium.  Right-sided chest tube remains in place pleural TPA/dornase given  02/10: Pt remains mechanically intubated vent settings: PEEP 10/FiO2 60%.   Will perform WUA and wean PEEP/FiO2 as tolerated.  Chest tube output overnight 50 ml.  Dornase injected into pleural space in pleural catheter 02/11: Pt remains mechanically intubated current settings PEEP 10/FiO2 increased from 50% to 60%.  No chest tube output overnight will administer TPA/dornase today.  Clindamycin added today  Significant Events from (2/2 to 2/11) recorded at [facility]    02/12: Remained intubated. Started on Klonopin 0.5 mg BID. Stop Clindamycin, ID consulted. Stopped vancomycin. Started on IV Linezolid 600 mg.  02/13: CT Chest: decrease in size of R pneumothorax. Small bilateral pleural effusions L>R. Consolidative changes.  02/14: Stop IV heparin. Start SubQ Lovenox BID   Interim History / Subjective:   No acute events overnight Remains intubated, agitated intermittently with vent dyssynchrony   Objective   Blood pressure (!) 110/55, pulse 66, temperature 98.4 F (36.9 C), resp. rate (!) 25, height 5' 5.98" (1.676 m), weight 85.8 kg, SpO2 97%.    Vent Mode: PRVC FiO2 (%):  [40 %-55 %] 55 % Set Rate:  [25 bmp]  25 bmp Vt Set:  [470 mL] 470 mL PEEP:  [8 cmH20] 8 cmH20 Plateau  Pressure:  [19 cmH20-21 cmH20] 19 cmH20   Intake/Output Summary (Last 24 hours) at 08/29/2023 0713 Last data filed at 08/29/2023 0700 Gross per 24 hour  Intake 4328.35 ml  Output 3330 ml  Net 998.35 ml   Filed Weights   08/27/23 0441 08/28/23 0418 08/29/23 0452  Weight: 85.2 kg 86 kg 85.8 kg    Examination: General: appears critically ill, intubated and on vent  HENT:  ETT and NGT  Lungs: course breath sounds, no wheezing. Thick bloody/tan secretions. Right chest tube in place. Left pleural effusion remains small ans simple based on bedside US today Cardiovascular: RR. No murmur.  Abdomen: soft, ND. Bowel sounds present  Extremities: 1+ pitting edema b/l LE  Neuro: PERRL, sedated GU: foley catheter   Pertinent Imaging:  2/3 CXR showed no active disease  2/4 CXR showed Confluent new right greater than left lung base opacity, New right pleural effusion not excluded  2/6 CXR: Patchy consolidation in the right greater than left mid to lower lung fields and small pleural effusions.  2/6 Abd xray: Dilated loops of small and large bowel, suggestive of ileus.  2/8 CXR: Unchanged bibasilar airspace opacities, greater on the right, likely due to combination of pneumonia and small pleural effusions 2/9 CXR: Patchy bibasilar lung consolidation, right greater than left, slightly worsened on the right. Probable stable small bilateral pleural effusions  2/9 CT chest: Right hydropneumothorax with a small pneumo component anteriorly, and a separate hydropneumothorax which could be loculated, posterior and medial to the right lower lobe. Extensive consolidative airspace disease extending from the hila into the bilateral lower lobe basal segments and into the right middle lobe lateral segment more so than the medial segment. 2/10 CXR: Improved aeration in the right lung base with persistent opacity seen. 2/11 CXR: Similar appearance of bilateral lower lung field airspace opacities. 2/13 CT chest:  Decrease in the size of right pneumothorax. Minimal residual pneumothorax noted anterior to the right lung. Small bilateral pleural effusions, left greater than right. Consolidative changes of the lungs and cavitary nodules, relatively similar to prior CT.  Resolved Hospital Problem list   N/A  Assessment & Plan:  Acute hypoxic respiratory failure 2/2 influenza  Septic Shock 2/2 MRSA necrotizing pneumonia and empyema s/p chest tube  Hydropneumothorax COPD Exacerbation  Left Pleural effusion - Continue ventilator support - will attempt SAT today and see if she can tolerate PSV trial - She remains very agitated intermittently with high sedation needs - Continue propofol and fenanyl drips - PRN versed - on scheduled klonipin 1mg  TID per tube - start Oxycodone 10mg  TID per tube - Continue IV Linezolid 600 mg q12h  - Tylenol 1000 mg q8h  - IV lasix 40 mg this AM and 20mg  IV in PM - Continue nebulizer treatments: Brovana, yupelri, levalbuterol  - Hypertonic saline nebulization BID  - Continue O2 sat goal of >90%  - Continue right chest tube for now, can likely be removed in near future - left pleural effusion remains simple and small, continue to monitor for evolving parapneumonic effusion vs empyema needing drainage  New onset Afib with RVR  Currently NSR.  - Continue subQ Lovenox 85 mg BID - Continue Amiodarone 200 mg BID  - Aspirin 81 mg and Lipitor 80 mg daily   Ekbom's Delusional Parasitosis  Substance abuse psychosis- UDS on admit + for amphetamines  Bipolar 1 disorder hx  Possible  benzo withdrawal  GAD hx P: Continue Abilify 5 mg daily  Once patient is extubated, re-consult psych for assist in management  Continue to wean sedation as tolerated  Restart celexa when appropriate     Normocytic Anemia Hgb has been within the ~10-11 since hospitalization. Will continue to monitor.    HTN PRN Hydralazine 10 mg q6h for SBP > 170 or DBP > 110   Genital Herpes  -  valacyclovir 500mg  BID for 3 days  Yeast Infection - nystatin powder  Best Practice (right click and "Reselect all SmartList Selections" daily)   Diet/type: tubefeeds DVT prophylaxis Lovenox  Pressure ulcer(s): Assessed per RN  GI prophylaxis: PPI Lines: RIJ, art line, NG, R chest tube  Foley:  Yes, and it is still needed Code Status:  DNR Last date of multidisciplinary goals of care discussion [--]  Labs   CBC: Recent Labs  Lab 08/25/23 0345 08/25/23 0449 08/26/23 0300 08/27/23 0314 08/28/23 0600 08/29/23 0534  WBC 26.7*  --  23.1* 24.8* 24.3* 15.4*  HGB 10.0* 9.9* 9.7* 10.1* 8.9* 7.5*  HCT 31.0* 29.0* 29.5* 30.4* 27.0* 22.9*  MCV 94.8  --  92.8 92.1 91.8 93.5  PLT 298  --  355 375 340 245    Basic Metabolic Panel: Recent Labs  Lab 08/23/23 0439 08/23/23 0909 08/24/23 0413 08/24/23 1638 08/25/23 0345 08/25/23 0449 08/26/23 0300 08/27/23 0314 08/28/23 0600 08/28/23 1536 08/28/23 2340 08/29/23 0534  NA 142   < > 145  144   < > 147*   < > 140 137 133* 135 135  --   K 5.4*   < > 5.0  5.0   < > 4.3   < > 3.0* 3.0* 3.0* 3.1* 3.8  --   CL 106   < > 108  107  --  104  --  101 97* 94* 94* 97*  --   CO2 29   < > 30  30  --  31  --  30 31 29 31 28   --   GLUCOSE 255*   < > 185*  179*  --  129*  --  173* 152* 189* 162* 176*  --   BUN 39*   < > 34*  34*  --  24*  --  26* 18 17 20 16   --   CREATININE 0.66   < > 0.48  0.50  --  0.55  --  0.53 0.52 0.53 0.52 0.54  --   CALCIUM 8.6*   < > 8.8*  8.9  --  9.0  --  7.8* 7.7* 7.6* 7.7* 7.7*  --   MG 2.5*  --  2.5*  --  2.0  --   --   --  1.8  --   --  1.9  PHOS 3.7  --  2.7  --  3.0  --   --   --  3.9  --   --  3.2   < > = values in this interval not displayed.   GFR: Estimated Creatinine Clearance: 79.4 mL/min (by C-G formula based on SCr of 0.54 mg/dL). Recent Labs  Lab 08/23/23 0909 08/24/23 0413 08/26/23 0300 08/27/23 0314 08/28/23 0600 08/29/23 0534  PROCALCITON 2.89  --   --   --   --   --   WBC  --     < > 23.1* 24.8* 24.3* 15.4*   < > = values in this interval not displayed.    Liver Function Tests:  Recent Labs  Lab 08/23/23 0439 08/24/23 0413 08/24/23 0931  AST  --   --  21  ALT  --   --  24  ALKPHOS  --   --  44  BILITOT  --   --  0.3  PROT  --   --  5.6*  ALBUMIN 1.9* 1.8* 1.7*   No results for input(s): "LIPASE", "AMYLASE" in the last 168 hours. No results for input(s): "AMMONIA" in the last 168 hours.  ABG    Component Value Date/Time   PHART 7.449 08/25/2023 0449   PCO2ART 46.9 08/25/2023 0449   PO2ART 67 (L) 08/25/2023 0449   HCO3 32.3 (H) 08/25/2023 0449   TCO2 34 (H) 08/25/2023 0449   ACIDBASEDEF 7.4 (H) 08/19/2023 1508   O2SAT 93 08/25/2023 0449     Coagulation Profile: No results for input(s): "INR", "PROTIME" in the last 168 hours.  Cardiac Enzymes: No results for input(s): "CKTOTAL", "CKMB", "CKMBINDEX", "TROPONINI" in the last 168 hours.  HbA1C: Hemoglobin A1C  Date/Time Value Ref Range Status  03/08/2014 04:32 AM 5.3 4.2 - 6.3 % Final    Comment:    The American Diabetes Association recommends that a primary goal of therapy should be <7% and that physicians should reevaluate the treatment regimen in patients with HbA1c values consistently >8%.    Hgb A1c MFr Bld  Date/Time Value Ref Range Status  08/19/2023 03:26 PM 5.5 4.8 - 5.6 % Final    Comment:    (NOTE) Pre diabetes:          5.7%-6.4%  Diabetes:              >6.4%  Glycemic control for   <7.0% adults with diabetes   03/24/2019 08:24 AM 5.7 4.6 - 6.5 % Final    Comment:    Glycemic Control Guidelines for People with Diabetes:Non Diabetic:  <6%Goal of Therapy: <7%Additional Action Suggested:  >8%     CBG: Recent Labs  Lab 08/28/23 1528 08/28/23 1904 08/28/23 2326 08/29/23 0349 08/29/23 0710  GLUCAP 170* 142* 165* 134* 143*       Critical care time: 35 minutes     Melody Comas, MD [facility] Office: [phone]   See Amion for  personal pager PCCM on call pager [phone] until 7pm. Please call Elink 7p-7a. [phone]

## 2023-08-30 ENCOUNTER — Encounter (HOSPITAL_COMMUNITY): Payer: Self-pay | Admitting: Pulmonary Disease

## 2023-08-30 DIAGNOSIS — J158 Pneumonia due to other specified bacteria: Secondary | ICD-10-CM | POA: Diagnosis not present

## 2023-08-30 DIAGNOSIS — Z9911 Dependence on respirator [ventilator] status: Secondary | ICD-10-CM | POA: Diagnosis not present

## 2023-08-30 DIAGNOSIS — A6009 Herpesviral infection of other urogenital tract: Secondary | ICD-10-CM

## 2023-08-30 DIAGNOSIS — R0603 Acute respiratory distress: Secondary | ICD-10-CM | POA: Diagnosis not present

## 2023-08-30 DIAGNOSIS — B9562 Methicillin resistant Staphylococcus aureus infection as the cause of diseases classified elsewhere: Secondary | ICD-10-CM | POA: Diagnosis not present

## 2023-08-30 DIAGNOSIS — J9601 Acute respiratory failure with hypoxia: Secondary | ICD-10-CM | POA: Diagnosis not present

## 2023-08-30 LAB — BASIC METABOLIC PANEL
Anion gap: 9 (ref 5–15)
BUN: 15 mg/dL (ref 8–23)
CO2: 28 mmol/L (ref 22–32)
Calcium: 7.8 mg/dL — ABNORMAL LOW (ref 8.9–10.3)
Chloride: 99 mmol/L (ref 98–111)
Creatinine, Ser: 0.5 mg/dL (ref 0.44–1.00)
GFR, Estimated: >60 mL/min (ref >60)
Glucose, Bld: 148 mg/dL — ABNORMAL HIGH (ref 70–99)
Potassium: 3.5 mmol/L (ref 3.5–5.1)
Sodium: 136 mmol/L (ref 135–145)

## 2023-08-30 LAB — CBC
HCT: 23.9 % — ABNORMAL LOW (ref 36.0–46.0)
Hemoglobin: 7.7 g/dL — ABNORMAL LOW (ref 12.0–15.0)
MCH: 30.3 pg (ref 26.0–34.0)
MCHC: 32.2 g/dL (ref 30.0–36.0)
MCV: 94.1 fL (ref 80.0–100.0)
Platelets: 286 10*3/uL (ref 150–400)
RBC: 2.54 MIL/uL — ABNORMAL LOW (ref 3.87–5.11)
RDW: 13.6 % (ref 11.5–15.5)
WBC: 13.8 10*3/uL — ABNORMAL HIGH (ref 4.0–10.5)
nRBC: 0 % (ref 0.0–0.2)

## 2023-08-30 LAB — GLUCOSE, CAPILLARY
Glucose-Capillary: 113 mg/dL — ABNORMAL HIGH (ref 70–99)
Glucose-Capillary: 132 mg/dL — ABNORMAL HIGH (ref 70–99)
Glucose-Capillary: 138 mg/dL — ABNORMAL HIGH (ref 70–99)
Glucose-Capillary: 142 mg/dL — ABNORMAL HIGH (ref 70–99)
Glucose-Capillary: 149 mg/dL — ABNORMAL HIGH (ref 70–99)
Glucose-Capillary: 159 mg/dL — ABNORMAL HIGH (ref 70–99)

## 2023-08-30 LAB — MAGNESIUM: Magnesium: 1.7 mg/dL (ref 1.7–2.4)

## 2023-08-30 MED ORDER — ALBUMIN HUMAN 25 % IV SOLN
25.0000 g | Freq: Four times a day (QID) | INTRAVENOUS | Status: AC
Start: 2023-08-30 — End: 2023-08-31
  Administered 2023-08-30 – 2023-08-31 (×4): 25 g via INTRAVENOUS
  Filled 2023-08-30 (×3): qty 100

## 2023-08-30 MED ORDER — VALACYCLOVIR HCL 500 MG PO TABS
1000.0000 mg | ORAL_TABLET | Freq: Two times a day (BID) | ORAL | Status: AC
Start: 2023-08-30 — End: 2023-09-06
  Administered 2023-08-30 – 2023-09-06 (×14): 1000 mg
  Filled 2023-08-30 (×14): qty 2

## 2023-08-30 MED ORDER — POTASSIUM CHLORIDE 10 MEQ/50ML IV SOLN
10.0000 meq | INTRAVENOUS | Status: AC
  Administered 2023-08-30 (×4): 10 meq via INTRAVENOUS
  Filled 2023-08-30 (×4): qty 50

## 2023-08-30 MED ORDER — ETOMIDATE 2 MG/ML IV SOLN
20.0000 mg | Freq: Once | INTRAVENOUS | Status: AC
  Administered 2023-08-30: 20 mg via INTRAVENOUS
  Filled 2023-08-30: qty 10

## 2023-08-30 MED ORDER — FUROSEMIDE 10 MG/ML IJ SOLN
60.0000 mg | Freq: Three times a day (TID) | INTRAMUSCULAR | Status: AC
  Administered 2023-08-30 – 2023-08-31 (×6): 60 mg via INTRAVENOUS
  Filled 2023-08-30 (×6): qty 6

## 2023-08-30 MED ORDER — POTASSIUM CHLORIDE 20 MEQ PO PACK
40.0000 meq | PACK | ORAL | Status: AC
  Administered 2023-08-30 (×3): 40 meq
  Filled 2023-08-30 (×3): qty 2

## 2023-08-30 MED ORDER — MAGNESIUM SULFATE 2 GM/50ML IV SOLN
2.0000 g | Freq: Once | INTRAVENOUS | Status: AC
  Administered 2023-08-30: 2 g via INTRAVENOUS
  Filled 2023-08-30: qty 50

## 2023-08-30 MED ORDER — ROCURONIUM BROMIDE 10 MG/ML (PF) SYRINGE
50.0000 mg | PREFILLED_SYRINGE | Freq: Once | INTRAVENOUS | Status: AC
  Administered 2023-08-30: 50 mg via INTRAVENOUS
  Filled 2023-08-30: qty 10

## 2023-08-30 NOTE — Progress Notes (Signed)
 RT assisted MD with bronchoscopy procedure. Sputum obtained and walked to lab. Pt tolerated well, no complications.

## 2023-08-30 NOTE — Plan of Care (Signed)
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
## 2023-08-30 NOTE — Procedures (Signed)
 08/30/2023 Called to bedside for recurrent inability to ventilate despite sedation. Changed trigger to -20, still very high degree of airway resistance. Emergent bronch showing large volume inspissated mucus along ETT unable to suction; so gave sedation and RT student reintubated with larger tube with immediate improvement in ability to ventilate. After reintubation used bronchoscope to place ETT optimally above carina and perform BAL RLL. No immediate complications.  Myrla Halsted MD PCCM

## 2023-08-30 NOTE — Plan of Care (Signed)
  Problem: Clinical Measurements: Goal: Diagnostic test results will improve Outcome: Progressing   Problem: Education: Goal: Knowledge of General Education information will improve Description: Including pain rating scale, medication(s)/side effects and non-pharmacologic comfort measures Outcome: Not Progressing

## 2023-08-30 NOTE — Progress Notes (Signed)
 [facility]    Date of Admission:  08/24/2023   Total days of antibiotics -day 7 linezolid  ID: Kaylee [name] is a 63 y.o. female with   Principal Problem:   Septic shock (HCC)    Subjective: Afebrile, slightly less sedation; rn noted patient having new genital herpetic lesions  Leukocytosis improving  Medications:   acetaminophen (TYLENOL) oral liquid 160 mg/5 mL  1,000 mg Per Tube Q8H   amiodarone  200 mg Per Tube BID   arformoterol  15 mcg Nebulization BID   ARIPiprazole  5 mg Per Tube Daily   aspirin  81 mg Per Tube Daily   atorvastatin  80 mg Per Tube Daily   Chlorhexidine Gluconate Cloth  6 each Topical Daily   clonazePAM  1 mg Per Tube TID   enoxaparin (LOVENOX) injection  1 mg/kg Subcutaneous Q12H   feeding supplement (PROSource TF20)  60 mL Per Tube Daily   furosemide  60 mg Intravenous Q8H   insulin aspart  0-20 Units Subcutaneous Q4H   nystatin   Topical BID   mouth rinse  15 mL Mouth Rinse Q2H   oxyCODONE  10 mg Per Tube Q8H   pantoprazole (PROTONIX) IV  40 mg Intravenous Daily   potassium chloride  40 mEq Per Tube Q4H   revefenacin  175 mcg Nebulization Daily   sodium chloride flush  10 mL Intrapleural Q8H   sodium chloride HYPERTONIC  4 mL Nebulization BID   valACYclovir  500 mg Per Tube BID    Objective: Vital signs in last 24 hours: Temp:  [98.4 F (36.9 C)-100 F (37.8 C)] 98.8 F (37.1 C) (02/17 0900) Pulse Rate:  [64-103] 84 (02/17 0900) Resp:  [14-37] 25 (02/17 0900) BP: (97-166)/(46-71) 138/58 (02/17 0900) SpO2:  [93 %-100 %] 95 % (02/17 0900) Arterial Line BP: (82-185)/(35-71) 147/48 (02/17 0900) FiO2 (%):  [50 %] 50 % (02/17 0757) Weight:  [86.2 kg] 86.2 kg (02/17 0334) Physical Exam  Constitutional: sedated. appears well-developed and well-nourished. No distress.  HENT: [place]/AT, PERRLA, no scleral icterus Mouth/Throat: OETT in place Cardiovascular: Normal rate, regular rhythm and normal heart sounds. Exam  reveals no gallop and no friction rub.  No murmur heard.  Pulmonary/Chest: Effort normal and breath sounds normal. No respiratory distress.  has no wheezes. Right chest tube in place Neck = supple, no nuchal rigidity Abdominal: Soft. Bowel sounds are normal.  exhibits no distension. There is no tenderness.  Lymphadenopathy: no cervical adenopathy. No axillary adenopathy Neurological: alert and oriented to person, place, and time.  Skin: Skin is warm and dry. No rash noted. No erythema.  Psychiatric: a normal mood and affect.  behavior is normal.     Lab Results Recent Labs    08/28/23 2340 08/29/23 0534 08/30/23 0623  WBC  --  15.4* 13.8*  HGB  --  7.5* 7.7*  HCT  --  22.9* 23.9*  NA 135  --  136  K 3.8  --  3.5  CL 97*  --  99  CO2 28  --  28  BUN 16  --  15  CREATININE 0.54  --  0.50   Liver Panel No results for input(s): "PROT", "ALBUMIN", "AST", "ALT", "ALKPHOS", "BILITOT", "BILIDIR", "IBILI" in the last 72 hours. Sedimentation Rate No results for input(s): "ESRSEDRATE" in the last 72 hours. C-Reactive Protein No results for input(s): "CRP" in the last 72 hours.  Microbiology:  Studies/Results: No results found.   Assessment/Plan:  Post influenza MRSA pneumonia = continue on linezolid 600mg  iv q12hr. Plant to continue with this abtx. Not showing signs of myelosuppression/thrombocytopenia. can see some of these side effects after 10-14 d of abtx.   Long term medication/drug SE = no thrombocytopenia  Genital herpetic lesions = continue with valtrex but increase to valtrex 1gm bid x 7-10d.  Respiratory failure requiring  vent = continues to be managed by primary team. Chest tube to be removed today.    [facility] Pager: [phone]  08/30/2023, 10:13 AM

## 2023-08-30 NOTE — Procedures (Signed)
 Intubation Procedure Note  Kaylee [name]  [id number]  26-Oct-1960  Date:08/30/23  Time:5:41 PM   Provider Performing:Millianna Szymborski T [facility]ore    Procedure: Intubation (31500)  Indication(s) Respiratory Failure  Consent Unable to obtain consent due to emergent nature of procedure.   Anesthesia    Time Out Verified patient identification, verified procedure, site/side was marked, verified correct patient position, special equipment/implants available, medications/allergies/relevant history reviewed, required imaging and test results available.   Sterile Technique Usual hand hygeine, masks, and gloves were used   Procedure Description Patient positioned in bed supine.  Sedation given as noted above.  Patient was intubated with endotracheal tube using Glidescope.  View was Grade 1 full glottis .  Number of attempts was 1.  Colorimetric CO2 detector was consistent with tracheal placement.   Complications/Tolerance None; patient tolerated the procedure well. Chest X-ray is ordered to verify placement.   EBL Minimal   Specimen(s) None   RT reintubated pt with MD at bedside due to obstructed ETT. No complications. Positive color change noted. MD bronched pt post intubation.

## 2023-08-30 NOTE — Procedures (Signed)
 Cortrak  Person Inserting Tube:  Dorris Carnes, RN Tube Type:  Cortrak - 43 inches Tube Size:  10 Tube Location:  Left nare Initial Placement:  Stomach Secured by: Bridle Technique Used to Measure Tube Placement:  Marking at nare/corner of mouth Cortrak Secured At:  79 cm   Cortrak Tube Team Note:  Consult received to place a Cortrak feeding tube.   No x-ray is required. RN may begin using tube.   If the tube becomes dislodged please keep the tube and contact the Cortrak team at [email or web address] for replacement.  If after hours and replacement cannot be delayed, place a NG tube and confirm placement with an abdominal x-ray.    Cammy Copa., RD, LDN, CNSC See AMiON for contact information

## 2023-08-30 NOTE — TOC Progression Note (Signed)
 Transition of Care [facility]) - Progression Note    Patient Details  Name: Kaylee [name] MRN: [id number] Date of Birth: 1960-10-30  [facility] of [facility] CM/SW Contact  Marliss Coots, LCSW Phone Number: 08/30/2023, 1:32 PM  Clinical Narrative:     1:32 PM Per chart review, patient remains on tube feeds and a ventilator.     Barriers to Discharge: Continued Medical Work up  Expected Discharge Plan and Services In-house Referral: Clinical Social Work     Living arrangements for the past 2 months: Single Family Home                                       Social Determinants of Health (SDOH) Interventions SDOH Screenings   Food Insecurity: Patient Unable To Answer (08/27/2023)  Housing: Patient Unable To Answer (08/27/2023)  Transportation Needs: Patient Unable To Answer (08/27/2023)  Recent Concern: Transportation Needs - Unmet Transportation Needs (08/18/2023)  Utilities: Patient Unable To Answer (08/27/2023)  Alcohol Screen: Low Risk  (11/28/2021)  Depression (PHQ2-9): Low Risk  (03/16/2023)  Financial Resource Strain: High Risk (11/11/2021)  Social Connections: Moderately Isolated (08/18/2023)  Stress: Stress Concern Present (11/11/2021)  Tobacco Use: High Risk (08/29/2023)    Readmission Risk Interventions     No data to display

## 2023-08-31 ENCOUNTER — Ambulatory Visit (HOSPITAL_COMMUNITY): Payer: 59 | Admitting: Psychiatry

## 2023-08-31 DIAGNOSIS — D649 Anemia, unspecified: Secondary | ICD-10-CM

## 2023-08-31 DIAGNOSIS — J869 Pyothorax without fistula: Secondary | ICD-10-CM

## 2023-08-31 DIAGNOSIS — E876 Hypokalemia: Secondary | ICD-10-CM

## 2023-08-31 DIAGNOSIS — Z9911 Dependence on respirator [ventilator] status: Secondary | ICD-10-CM | POA: Diagnosis not present

## 2023-08-31 DIAGNOSIS — R0603 Acute respiratory distress: Secondary | ICD-10-CM | POA: Diagnosis not present

## 2023-08-31 DIAGNOSIS — R509 Fever, unspecified: Secondary | ICD-10-CM

## 2023-08-31 DIAGNOSIS — J158 Pneumonia due to other specified bacteria: Secondary | ICD-10-CM | POA: Diagnosis not present

## 2023-08-31 DIAGNOSIS — B9562 Methicillin resistant Staphylococcus aureus infection as the cause of diseases classified elsewhere: Secondary | ICD-10-CM | POA: Diagnosis not present

## 2023-08-31 LAB — BASIC METABOLIC PANEL
Anion gap: 13 (ref 5–15)
BUN: 16 mg/dL (ref 8–23)
CO2: 28 mmol/L (ref 22–32)
Calcium: 8.3 mg/dL — ABNORMAL LOW (ref 8.9–10.3)
Chloride: 97 mmol/L — ABNORMAL LOW (ref 98–111)
Creatinine, Ser: 0.66 mg/dL (ref 0.44–1.00)
GFR, Estimated: >60 mL/min (ref >60)
Glucose, Bld: 161 mg/dL — ABNORMAL HIGH (ref 70–99)
Potassium: 3.1 mmol/L — ABNORMAL LOW (ref 3.5–5.1)
Sodium: 138 mmol/L (ref 135–145)

## 2023-08-31 LAB — MAGNESIUM: Magnesium: 1.8 mg/dL (ref 1.7–2.4)

## 2023-08-31 LAB — CBC
HCT: 20.4 % — ABNORMAL LOW (ref 36.0–46.0)
Hemoglobin: 6.7 g/dL — CL (ref 12.0–15.0)
MCH: 31 pg (ref 26.0–34.0)
MCHC: 32.8 g/dL (ref 30.0–36.0)
MCV: 94.4 fL (ref 80.0–100.0)
Platelets: 255 10*3/uL (ref 150–400)
RBC: 2.16 MIL/uL — ABNORMAL LOW (ref 3.87–5.11)
RDW: 13.5 % (ref 11.5–15.5)
WBC: 10.6 10*3/uL — ABNORMAL HIGH (ref 4.0–10.5)
nRBC: 0 % (ref 0.0–0.2)

## 2023-08-31 LAB — TRIGLYCERIDES: Triglycerides: 87 mg/dL (ref <150)

## 2023-08-31 LAB — HEMOGLOBIN AND HEMATOCRIT, BLOOD
HCT: 24.2 % — ABNORMAL LOW (ref 36.0–46.0)
Hemoglobin: 8 g/dL — ABNORMAL LOW (ref 12.0–15.0)

## 2023-08-31 LAB — PREPARE RBC (CROSSMATCH)

## 2023-08-31 LAB — GLUCOSE, CAPILLARY
Glucose-Capillary: 119 mg/dL — ABNORMAL HIGH (ref 70–99)
Glucose-Capillary: 144 mg/dL — ABNORMAL HIGH (ref 70–99)
Glucose-Capillary: 127 mg/dL — ABNORMAL HIGH (ref 70–99)
Glucose-Capillary: 113 mg/dL — ABNORMAL HIGH (ref 70–99)
Glucose-Capillary: 117 mg/dL — ABNORMAL HIGH (ref 70–99)
Glucose-Capillary: 173 mg/dL — ABNORMAL HIGH (ref 70–99)

## 2023-08-31 LAB — ABO/RH: ABO/RH(D): A POS

## 2023-08-31 MED ORDER — CLONAZEPAM 1 MG PO TABS
2.0000 mg | ORAL_TABLET | Freq: Three times a day (TID) | ORAL | Status: DC
  Administered 2023-08-31 – 2023-09-06 (×18): 2 mg
  Filled 2023-08-31 (×18): qty 2

## 2023-08-31 MED ORDER — NUTRISOURCE FIBER PO PACK
1.0000 | PACK | Freq: Two times a day (BID) | ORAL | Status: DC
  Administered 2023-08-31 – 2023-09-06 (×13): 1
  Filled 2023-08-31 (×13): qty 1

## 2023-08-31 MED ORDER — SODIUM CHLORIDE 0.9 % IV SOLN
0.5000 mg/h | INTRAVENOUS | Status: DC
  Administered 2023-08-31: 4 mg/h via INTRAVENOUS
  Administered 2023-08-31: 1 mg/h via INTRAVENOUS
  Administered 2023-09-01 (×2): 4 mg/h via INTRAVENOUS
  Administered 2023-09-02: 4.5 mg/h via INTRAVENOUS
  Administered 2023-09-02: 5 mg/h via INTRAVENOUS
  Administered 2023-09-03: 2 mg/h via INTRAVENOUS
  Filled 2023-08-31 (×10): qty 5

## 2023-08-31 MED ORDER — CHLORHEXIDINE GLUCONATE CLOTH 2 % EX PADS
6.0000 | MEDICATED_PAD | Freq: Every day | CUTANEOUS | Status: DC
  Administered 2023-08-31 – 2023-09-06 (×6): 6 via TOPICAL

## 2023-08-31 MED ORDER — OXYCODONE HCL 5 MG PO TABS
10.0000 mg | ORAL_TABLET | Freq: Four times a day (QID) | ORAL | Status: DC
  Administered 2023-08-31 – 2023-09-01 (×5): 10 mg
  Filled 2023-08-31 (×5): qty 2

## 2023-08-31 MED ORDER — TRANEXAMIC ACID FOR INHALATION
500.0000 mg | Freq: Three times a day (TID) | RESPIRATORY_TRACT | Status: AC
  Administered 2023-08-31 – 2023-09-01 (×3): 500 mg via RESPIRATORY_TRACT
  Filled 2023-08-31 (×3): qty 10

## 2023-08-31 MED ORDER — POTASSIUM CHLORIDE 20 MEQ PO PACK
20.0000 meq | PACK | ORAL | Status: DC
  Administered 2023-08-31: 20 meq
  Filled 2023-08-31: qty 1

## 2023-08-31 MED ORDER — POTASSIUM CHLORIDE 20 MEQ PO PACK
40.0000 meq | PACK | Freq: Two times a day (BID) | ORAL | Status: AC
Start: 2023-08-31 — End: 2023-09-01
  Administered 2023-08-31 – 2023-09-01 (×2): 40 meq
  Filled 2023-08-31 (×2): qty 2

## 2023-08-31 MED ORDER — LIDOCAINE HCL (PF) 2% IJ FOR NEBU
5.0000 mL | Freq: Once | RESPIRATORY_TRACT | Status: AC
  Administered 2023-08-31: 5 mL via RESPIRATORY_TRACT
  Filled 2023-08-31: qty 5

## 2023-08-31 MED ORDER — HYDROMORPHONE HCL-NACL 50-0.9 MG/50ML-% IV SOLN
0.5000 mg/h | INTRAVENOUS | Status: DC
Start: 2023-08-31 — End: 2023-08-31

## 2023-08-31 MED ORDER — POTASSIUM CHLORIDE 10 MEQ/50ML IV SOLN
10.0000 meq | INTRAVENOUS | Status: AC
  Administered 2023-08-31 (×4): 10 meq via INTRAVENOUS
  Filled 2023-08-31 (×4): qty 50

## 2023-08-31 MED ORDER — MAGNESIUM SULFATE 2 GM/50ML IV SOLN
2.0000 g | Freq: Once | INTRAVENOUS | Status: AC
  Administered 2023-08-31: 2 g via INTRAVENOUS
  Filled 2023-08-31: qty 50

## 2023-08-31 MED ORDER — POTASSIUM CHLORIDE 20 MEQ PO PACK
40.0000 meq | PACK | ORAL | Status: AC
  Administered 2023-08-31: 40 meq
  Filled 2023-08-31: qty 2

## 2023-08-31 MED ORDER — SODIUM CHLORIDE 0.9% IV SOLUTION
Freq: Once | INTRAVENOUS | Status: AC
  Administered 2023-08-31: 10 mL/h via INTRAVENOUS

## 2023-08-31 MED ORDER — HYDROMORPHONE BOLUS VIA INFUSION
0.2500 mg | INTRAVENOUS | Status: DC | PRN
  Administered 2023-09-01 – 2023-09-03 (×5): 1 mg via INTRAVENOUS
  Administered 2023-09-03: 1.5 mg via INTRAVENOUS
  Administered 2023-09-03 (×2): 1 mg via INTRAVENOUS
  Administered 2023-09-03: 2 mg via INTRAVENOUS
  Administered 2023-09-03 (×2): 1 mg via INTRAVENOUS
  Administered 2023-09-04: 1.5 mg via INTRAVENOUS
  Administered 2023-09-04 (×2): 2 mg via INTRAVENOUS
  Administered 2023-09-04: 1.5 mg via INTRAVENOUS
  Administered 2023-09-05: 2 mg via INTRAVENOUS
  Administered 2023-09-05: 2.5 mg via INTRAVENOUS
  Administered 2023-09-06: 1.25 mg via INTRAVENOUS

## 2023-08-31 MED ORDER — HYDROMORPHONE BOLUS VIA INFUSION
4.0000 mg | Freq: Once | INTRAVENOUS | Status: AC
  Administered 2023-08-31: 4 mg via INTRAVENOUS
  Filled 2023-08-31: qty 4

## 2023-08-31 MED ORDER — HYDROMORPHONE HCL 1 MG/ML IJ SOLN
1.0000 mg | Freq: Once | INTRAMUSCULAR | Status: AC
  Administered 2023-08-31: 1 mg via INTRAVENOUS
  Filled 2023-08-31: qty 1

## 2023-08-31 MED ORDER — HYDROMORPHONE BOLUS VIA INFUSION
0.2500 mg | INTRAVENOUS | Status: DC | PRN
  Administered 2023-08-31: 1 mg via INTRAVENOUS
  Administered 2023-08-31: 2 mg via INTRAVENOUS

## 2023-08-31 NOTE — Progress Notes (Signed)
 [facility]    Date of Admission:  08/24/2023   Total days of antibiotics 13  ID: Kaylee [name] is a 63 y.o. female with  post flu A with MRSA pneumonia, Principal Problem:   Septic shock (HCC)    Subjective: Still having dysynchroniy requiring titration of sedation  Medications:   acetaminophen (TYLENOL) oral liquid 160 mg/5 mL  1,000 mg Per Tube Q8H   amiodarone  200 mg Per Tube BID   arformoterol  15 mcg Nebulization BID   ARIPiprazole  5 mg Per Tube Daily   aspirin  81 mg Per Tube Daily   atorvastatin  80 mg Per Tube Daily   Chlorhexidine Gluconate Cloth  6 each Topical Daily   clonazePAM  2 mg Per Tube TID   feeding supplement (PROSource TF20)  60 mL Per Tube Daily   fiber  1 packet Per Tube BID   furosemide  60 mg Intravenous Q8H   HYDROmorphone  4 mg Intravenous Once   insulin aspart  0-20 Units Subcutaneous Q4H   nystatin   Topical BID   mouth rinse  15 mL Mouth Rinse Q2H   oxyCODONE  10 mg Per Tube Q6H   pantoprazole (PROTONIX) IV  40 mg Intravenous Daily   potassium chloride  40 mEq Per Tube BID   revefenacin  175 mcg Nebulization Daily   tranexamic acid  500 mg Nebulization Q8H   valACYclovir  1,000 mg Per Tube BID    Objective: Vital signs in last 24 hours: Temp:  [86.7 F (30.4 C)-102 F (38.9 C)] 102 F (38.9 C) (02/18 1519) Pulse Rate:  [65-93] 84 (02/18 1519) Resp:  [15-39] 22 (02/18 1519) BP: (97-157)/(39-68) 149/49 (02/18 1510) SpO2:  [89 %-100 %] 93 % (02/18 1519) Arterial Line BP: (90-208)/(32-78) 188/74 (02/18 1519) FiO2 (%):  [50 %] 50 % (02/18 1516) Weight:  [85 kg] 85 kg (02/18 0440)  Physical Exam  Constitutional:  oriented to person, place, and time. appears well-developed and well-nourished. No distress.  HENT: [place]/AT, PERRLA, no scleral icterus Mouth/Throat: OETT in place, slightly swollen lower lip but no facial or neck swelling Cardiovascular: Normal rate, regular rhythm and normal heart sounds. Exam  reveals no gallop and no friction rub.  No murmur heard.  Pulmonary/Chest: Effort normal and breath sounds normal. No respiratory distress.  has no wheezes.  Abdominal: Soft. Bowel sounds are decreased.  exhibits no distension. There is no tenderness.  Lymphadenopathy: no cervical adenopathy. No axillary adenopathy Skin: Skin is warm and dry. No rash noted. No erythema.   Lab Results Recent Labs    08/30/23 0623 08/31/23 0631  WBC 13.8* 10.6*  HGB 7.7* 6.7*  HCT 23.9* 20.4*  NA 136 138  K 3.5 3.1*  CL 99 97*  CO2 28 28  BUN 15 16  CREATININE 0.50 0.66   Liver Panel No results for input(s): "PROT", "ALBUMIN", "AST", "ALT", "ALKPHOS", "BILITOT", "BILIDIR", "IBILI" in the last 72 hours. Sedimentation Rate No results for input(s): "ESRSEDRATE" in the last 72 hours. C-Reactive Protein No results for input(s): "CRP" in the last 72 hours.  Microbiology: 2/17 bal culture - pending Studies/Results: No results found.   Assessment/Plan: MRSA pneumonia = continue with linezolid, appears to still tolerate without thrombocytopenia  Respiratory distress s/p vent dependence = continued management per primary team  Fever = appears isolated, will review sputum cx results to see if need to expand coverage. Will have low threshold to expand abtx coverage if  she is requiring further pressors.  Continue on contact isolation  [facility] Pager: [phone]  08/31/2023, 5:07 PM

## 2023-08-31 NOTE — Plan of Care (Signed)
  Problem: Clinical Measurements: Goal: Ability to maintain clinical measurements within normal limits will improve Outcome: Progressing Goal: Diagnostic test results will improve Outcome: Progressing Goal: Respiratory complications will improve Outcome: Progressing Goal: Cardiovascular complication will be avoided Outcome: Progressing   Problem: Activity: Goal: Risk for activity intolerance will decrease Outcome: Progressing   Problem: Coping: Goal: Level of anxiety will decrease Outcome: Progressing   Problem: Pain Managment: Goal: General experience of comfort will improve and/or be controlled Outcome: Progressing   Problem: Safety: Goal: Ability to remain free from injury will improve Outcome: Progressing
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
## 2023-09-01 ENCOUNTER — Inpatient Hospital Stay (HOSPITAL_COMMUNITY): Payer: 59

## 2023-09-01 DIAGNOSIS — R0603 Acute respiratory distress: Secondary | ICD-10-CM | POA: Diagnosis not present

## 2023-09-01 DIAGNOSIS — J158 Pneumonia due to other specified bacteria: Secondary | ICD-10-CM | POA: Diagnosis not present

## 2023-09-01 DIAGNOSIS — B9562 Methicillin resistant Staphylococcus aureus infection as the cause of diseases classified elsewhere: Secondary | ICD-10-CM | POA: Diagnosis not present

## 2023-09-01 DIAGNOSIS — Z9911 Dependence on respirator [ventilator] status: Secondary | ICD-10-CM | POA: Diagnosis not present

## 2023-09-01 LAB — BODY FLUID CELL COUNT WITH DIFFERENTIAL
Eos, Fluid: 0 %
Lymphs, Fluid: 12 %
Monocyte-Macrophage-Serous Fluid: 6 % — ABNORMAL LOW (ref 50–90)
Neutrophil Count, Fluid: 81 % — ABNORMAL HIGH (ref 0–25)
Other Cells, Fluid: 1 %
Total Nucleated Cell Count, Fluid: 6935 uL — ABNORMAL HIGH (ref 0–1000)

## 2023-09-01 LAB — BPAM RBC
Blood Product Expiration Date: 202503162359
ISSUE DATE / TIME: 202502181036
Unit Type and Rh: 6200

## 2023-09-01 LAB — GLUCOSE, PLEURAL OR PERITONEAL FLUID: Glucose, Fluid: 113 mg/dL

## 2023-09-01 LAB — BASIC METABOLIC PANEL
Anion gap: 9 (ref 5–15)
BUN: 17 mg/dL (ref 8–23)
CO2: 28 mmol/L (ref 22–32)
Calcium: 8.2 mg/dL — ABNORMAL LOW (ref 8.9–10.3)
Chloride: 100 mmol/L (ref 98–111)
Creatinine, Ser: 1.02 mg/dL — ABNORMAL HIGH (ref 0.44–1.00)
GFR, Estimated: >60 mL/min (ref >60)
Glucose, Bld: 129 mg/dL — ABNORMAL HIGH (ref 70–99)
Potassium: 3.7 mmol/L (ref 3.5–5.1)
Sodium: 137 mmol/L (ref 135–145)

## 2023-09-01 LAB — GLUCOSE, CAPILLARY
Glucose-Capillary: 113 mg/dL — ABNORMAL HIGH (ref 70–99)
Glucose-Capillary: 120 mg/dL — ABNORMAL HIGH (ref 70–99)
Glucose-Capillary: 140 mg/dL — ABNORMAL HIGH (ref 70–99)
Glucose-Capillary: 146 mg/dL — ABNORMAL HIGH (ref 70–99)
Glucose-Capillary: 161 mg/dL — ABNORMAL HIGH (ref 70–99)
Glucose-Capillary: 191 mg/dL — ABNORMAL HIGH (ref 70–99)

## 2023-09-01 LAB — CBC
HCT: 26.5 % — ABNORMAL LOW (ref 36.0–46.0)
Hemoglobin: 8.7 g/dL — ABNORMAL LOW (ref 12.0–15.0)
MCH: 29.9 pg (ref 26.0–34.0)
MCHC: 32.8 g/dL (ref 30.0–36.0)
MCV: 91.1 fL (ref 80.0–100.0)
Platelets: 293 10*3/uL (ref 150–400)
RBC: 2.91 MIL/uL — ABNORMAL LOW (ref 3.87–5.11)
RDW: 14.6 % (ref 11.5–15.5)
WBC: 13.6 10*3/uL — ABNORMAL HIGH (ref 4.0–10.5)
nRBC: 0 % (ref 0.0–0.2)

## 2023-09-01 LAB — TYPE AND SCREEN
ABO/RH(D): A POS
Antibody Screen: NEGATIVE
Unit division: 0

## 2023-09-01 LAB — LACTATE DEHYDROGENASE, PLEURAL OR PERITONEAL FLUID: LD, Fluid: 482 U/L — ABNORMAL HIGH (ref 3–23)

## 2023-09-01 LAB — PROTEIN, PLEURAL OR PERITONEAL FLUID: Total protein, fluid: 4.1 g/dL

## 2023-09-01 LAB — MAGNESIUM: Magnesium: 2 mg/dL (ref 1.7–2.4)

## 2023-09-01 MED ORDER — ENOXAPARIN SODIUM 100 MG/ML IJ SOSY
85.0000 mg | PREFILLED_SYRINGE | Freq: Two times a day (BID) | INTRAMUSCULAR | Status: DC
  Administered 2023-09-01: 85 mg via SUBCUTANEOUS
  Filled 2023-09-01: qty 0.85

## 2023-09-01 MED ORDER — SODIUM CHLORIDE 0.9 % IV SOLN
2.0000 g | Freq: Three times a day (TID) | INTRAVENOUS | Status: DC
  Administered 2023-09-01 – 2023-09-02 (×3): 2 g via INTRAVENOUS
  Filled 2023-09-01 (×3): qty 12.5

## 2023-09-01 MED ORDER — VITAL AF 1.2 CAL PO LIQD
1000.0000 mL | ORAL | Status: DC
  Administered 2023-09-01 – 2023-09-05 (×5): 1000 mL

## 2023-09-01 MED ORDER — ENOXAPARIN SODIUM 100 MG/ML IJ SOSY
85.0000 mg | PREFILLED_SYRINGE | Freq: Two times a day (BID) | INTRAMUSCULAR | Status: DC
  Administered 2023-09-02 – 2023-09-06 (×9): 85 mg via SUBCUTANEOUS
  Filled 2023-09-01 (×10): qty 0.85

## 2023-09-01 MED ORDER — ALBUMIN HUMAN 25 % IV SOLN
25.0000 g | Freq: Four times a day (QID) | INTRAVENOUS | Status: AC
  Administered 2023-09-01 – 2023-09-02 (×4): 25 g via INTRAVENOUS
  Filled 2023-09-01 (×5): qty 100

## 2023-09-01 MED ORDER — PROSOURCE TF20 ENFIT COMPATIBL EN LIQD
60.0000 mL | Freq: Two times a day (BID) | ENTERAL | Status: DC
  Administered 2023-09-01 – 2023-09-06 (×11): 60 mL
  Filled 2023-09-01 (×11): qty 60

## 2023-09-01 MED ORDER — SODIUM CHLORIDE 0.9% FLUSH
10.0000 mL | Freq: Three times a day (TID) | INTRAVENOUS | Status: DC
Start: 2023-09-01 — End: 2023-09-03
  Administered 2023-09-01 – 2023-09-03 (×5): 10 mL via INTRAPLEURAL

## 2023-09-01 NOTE — TOC Progression Note (Signed)
 Transition of Care [facility]) - Progression Note    Patient Details  Name: Kaylee [name] MRN: [id number] Date of Birth: January 31, 1961  Transition of Care [facility]) CM/SW Contact  Marliss Coots, LCSW Phone Number: 09/01/2023, 1:31 PM  Clinical Narrative:     1:31 PM Per progressions, patient remains intubated and on tube feeds. Patient's family is to have a goals of care meetings today regarding PEG and tracheostomy.     Barriers to Discharge: Continued Medical Work up  Expected Discharge Plan and Services In-house Referral: Clinical Social Work     Living arrangements for the past 2 months: Single Family Home                                       Social Determinants of Health (SDOH) Interventions SDOH Screenings   Food Insecurity: Patient Unable To Answer (08/27/2023)  Housing: Patient Unable To Answer (08/27/2023)  Transportation Needs: Patient Unable To Answer (08/27/2023)  Recent Concern: Transportation Needs - Unmet Transportation Needs (08/18/2023)  Utilities: Patient Unable To Answer (08/27/2023)  Alcohol Screen: Low Risk  (11/28/2021)  Depression (PHQ2-9): Low Risk  (03/16/2023)  Financial Resource Strain: High Risk (11/11/2021)  Social Connections: Moderately Isolated (08/18/2023)  Stress: Stress Concern Present (11/11/2021)  Tobacco Use: High Risk (08/29/2023)    Readmission Risk Interventions     No data to display
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
## 2023-09-01 NOTE — Progress Notes (Addendum)
 09/01/2023 Fair bit of fluid on L Daughter unable to come in and did not respond to voicemail Due to ongoing shock state and effusion being possible source proceeding with pigtail placement under emergent consent.  Myrla Halsted MD PCCM

## 2023-09-01 NOTE — Progress Notes (Signed)
 [facility]    Date of Admission:  08/24/2023     ID: Kaylee [name] is a 63 y.o. female with  post influenza A MRSA pneumonia and PTX Principal Problem:   Septic shock (HCC)    Subjective:  Had fever up to 101.7 yesterday but now afebrile.  Family meeting to discuss peg and trach today  BAL from 2/17 showing MSSA but now rare PsA Medications:   acetaminophen (TYLENOL) oral liquid 160 mg/5 mL  1,000 mg Per Tube Q8H   amiodarone  200 mg Per Tube BID   arformoterol  15 mcg Nebulization BID   ARIPiprazole  5 mg Per Tube Daily   aspirin  81 mg Per Tube Daily   atorvastatin  80 mg Per Tube Daily   Chlorhexidine Gluconate Cloth  6 each Topical Daily   clonazePAM  2 mg Per Tube TID   enoxaparin (LOVENOX) injection  85 mg Subcutaneous Q12H   feeding supplement (PROSource TF20)  60 mL Per Tube BID   fiber  1 packet Per Tube BID   insulin aspart  0-20 Units Subcutaneous Q4H   nystatin   Topical BID   mouth rinse  15 mL Mouth Rinse Q2H   oxyCODONE  10 mg Per Tube Q6H   pantoprazole (PROTONIX) IV  40 mg Intravenous Daily   revefenacin  175 mcg Nebulization Daily   valACYclovir  1,000 mg Per Tube BID    Objective: Vital signs in last 24 hours: Temp:  [97.5 F (36.4 C)-102 F (38.9 C)] 99.3 F (37.4 C) (02/19 1300) Pulse Rate:  [64-92] 68 (02/19 1300) Resp:  [22-26] 25 (02/19 1300) BP: (103-149)/(44-70) 116/54 (02/19 1300) SpO2:  [89 %-100 %] 93 % (02/19 1300) Arterial Line BP: (90-188)/(39-84) 118/47 (02/19 1300) FiO2 (%):  [50 %] 50 % (02/19 1154) Weight:  [88.7 kg] 88.7 kg (02/19 0500)  Physical Exam  Constitutional:  sedated. appears well-developed and well-nourished. No distress.  HENT:oett. Lower lip swollen small eschar and mucosal injury to right lower lip Mouth/Throat: Oropharynx is clear and moist. No oropharyngeal exudate.  Cardiovascular: Normal rate, regular rhythm and normal heart sounds. Exam reveals no gallop and no friction  rub.  No murmur heard.  Pulmonary/Chest: Effort normal and breath sounds normal. No respiratory distress.  has no wheezes.  Neck = supple, no nuchal rigidity Abdominal: Soft. Bowel sounds are normal.  exhibits no distension. There is no tenderness.  Skin: Skin is warm and dry. No rash noted. No erythema.    Lab Results Recent Labs    08/31/23 0631 08/31/23 1800 09/01/23 0422  WBC 10.6*  --  13.6*  HGB 6.7* 8.0* 8.7*  HCT 20.4* 24.2* 26.5*  NA 138  --  137  K 3.1*  --  3.7  CL 97*  --  100  CO2 28  --  28  BUN 16  --  17  CREATININE 0.66  --  1.02*   Liver Panel No results for input(s): "PROT", "ALBUMIN", "AST", "ALT", "ALKPHOS", "BILITOT", "BILIDIR", "IBILI" in the last 72 hours. Sedimentation Rate No results for input(s): "ESRSEDRATE" in the last 72 hours. C-Reactive Protein No results for input(s): "CRP" in the last 72 hours.  Microbiology:  Studies/Results: No results found.   Assessment/Plan: Post influenza A MRSA pneumonia = continue on linezolid. Would add pseudomonal coverage since she is having new fevers concern for secondary bacterial infection. Recommend either cefepime or ceftaz  Genital herpes = continue with valacyclovir  Long  term medication management = platelets are still stable  Severe respiratory distress s/p vent = agree with plans to address conversion to trach/peg so that sedation can be minimized.  [facility] Pager: [phone]  09/01/2023, 3:03 PM
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
## 2023-09-01 NOTE — Plan of Care (Signed)
  Problem: Clinical Measurements: Goal: Cardiovascular complication will be avoided Outcome: Progressing   Problem: Nutrition: Goal: Adequate nutrition will be maintained Outcome: Progressing   Problem: Pain Managment: Goal: General experience of comfort will improve and/or be controlled Outcome: Progressing   Problem: Safety: Goal: Ability to remain free from injury will improve Outcome: Progressing   Problem: Skin Integrity: Goal: Risk for impaired skin integrity will decrease Outcome: Progressing

## 2023-09-01 NOTE — Procedures (Signed)
 Insertion of Chest Tube Procedure Note  Kaylee [name]  [id number]  Nov 03, 1960  Date:09/01/23  Time:5:25 PM    Provider Performing: Jeral Pinch   Procedure: Chest Tube Insertion (32551)  Indication(s) Effusion  Consent Unable to obtain consent due to inability to find a medical decision maker for patient.  All reasonable efforts were made.  Another independent medical provider, Dr. Katrinka Blazing , confirmed the benefits of this procedure outweigh the risks.  Anesthesia Topical only with 1% lidocaine    Time Out Verified patient identification, verified procedure, site/side was marked, verified correct patient position, special equipment/implants available, medications/allergies/relevant history reviewed, required imaging and test results available.   Sterile Technique Maximal sterile technique including full sterile barrier drape, hand hygiene, sterile gown, sterile gloves, mask, hair covering, sterile ultrasound probe cover (if used).   Procedure Description Ultrasound used to identify appropriate pleural anatomy for placement and overlying skin marked. Area of placement cleaned and draped in sterile fashion.  A 14 French pigtail pleural catheter was placed into the left pleural space using Seldinger technique. Appropriate return of fluid was obtained.  The tube was connected to atrium and placed on -20 cm H2O wall suction.   Complications/Tolerance None; patient tolerated the procedure well. Chest X-ray is ordered to verify placement.   EBL Minimal  Specimen(s) fluid

## 2023-09-01 NOTE — Progress Notes (Signed)
 Nutrition Follow-up  DOCUMENTATION CODES:  Not applicable  INTERVENTION:  Tube feeding via cortrak: Vital 1.2 at 45 ml/h (1080 ml per day) Prosource TF20 60 ml 2x/d Provides 1456 kcal, 121 gm protein, 876 ml free water daily TF+propofol = 2159 kcal/d  NUTRITION DIAGNOSIS:  Increased nutrient needs related to acute illness (flu A, PNA, ARDS) as evidenced by estimated needs. - remains applicable  GOAL:  Patient will meet greater than or equal to 90% of their needs - progressing, being met with feeds at goal  MONITOR:  TF tolerance, Vent status, Labs, Weight trends  REASON FOR ASSESSMENT:  Consult Enteral/tube feeding initiation and management  ASSESSMENT:  Pt with hx of IBS-C, GERD, HLD, CAD, gout, COPD, and polysubstance abuse (EtOH, amphetamines, cocaine), admitted to [facility] with Flu A. Developed atrial fibrillation and ARDS superimposed by necrotizing pneumonia and transferred to [facility] 2/11 for access to cardiothoracic surgery services  2/2 - presented to [facility] ED 2/6 - transferred to ICU, intubated 2/8 - right chest tube placed 2/11 - transferred to [facility]  2/17 - cortrak tube placed, emergent bronchoscopy for poor ventilation, large mucus plug removed, right chest tube removed  Pt resting in bed, no family at bedside. Remains intubated and sedated. Discussed in rounds, family meeting planned today to discuss GOC and if tracheostomy/PEG are desired.   Propofol consistently higher as pt has been difficult to sedate. Adjust TF rate and regimen slightly.  MV: 12 L/min Temp (24hrs), Avg:99.7 F (37.6 C), Min:97.5 F (36.4 C), Max:102 F (38.9 C) MAP (Art Line):  Propofol: 30.5 ml/hr (805 kcal/d)  Admit weight: 82.1 kg (2/5, [facility])  Current weight: 88.7 kg   Intake/Output Summary (Last 24 hours) at 09/01/2023 0844 Last data filed at 09/01/2023 0700 Gross per 24 hour  Intake 4579.52 ml  Output 3425 ml  Net 1154.52 ml  Net IO Since Admission: 3,233.04  mL [09/01/23 0844]  Drains/Lines: Art Line CVC Triple Lumen, Right IJ UOP 3600 mL x 24 hours FMS (placed 2/10) 50mL x 24 hours Cortrak, gastric  Nutritionally Relevant Medications: Scheduled Meds:  atorvastatin  80 mg Per Tube Daily   PROSource TF20  60 mL Per Tube Daily   fiber  1 packet Per Tube BID   insulin aspart  0-20 Units Subcutaneous Q4H   pantoprazole IV  40 mg Intravenous Daily   potassium chloride  40 mEq Per Tube BID   valACYclovir  1,000 mg Per Tube BID   Continuous Infusions:  feeding supplement (VITAL AF 1.2 CAL) 50 mL/hr at 09/01/23 0700   linezolid (ZYVOX) IV Stopped (08/31/23 2259)   norepinephrine (LEVOPHED) Adult infusion 11 mcg/min (09/01/23 0700)   propofol (DIPRIVAN) infusion 60 mcg/kg/min (09/01/23 0700)   PRN Meds:.docusate, polyethylene glycol  Labs Reviewed: Creatinine 1.02 CBG ranges from 113-173 mg/dL over the last 24 hours HgbA1c 5.5%  NUTRITION - FOCUSED PHYSICAL EXAM: Flowsheet Row Most Recent Value  Orbital Region No depletion  Upper Arm Region No depletion  Thoracic and Lumbar Region No depletion  Buccal Region No depletion  Temple Region No depletion  Clavicle Bone Region No depletion  Clavicle and Acromion Bone Region Mild depletion  Scapular Bone Region Mild depletion  Dorsal Hand Unable to assess  [mittens]  Patellar Region Moderate depletion  Anterior Thigh Region Moderate depletion  Posterior Calf Region Moderate depletion  Edema (RD Assessment) Mild  [extremities]  Hair Reviewed  Eyes Reviewed  Mouth Unable to assess  [ETT holder blocking view]  Skin Reviewed  [  previous graft sites noted to left leg]  Nails Unable to assess  [mittens]    Diet Order:   Diet Order             Diet NPO time specified  Diet effective now                   EDUCATION NEEDS:  Not appropriate for education at this time  Skin:  Skin Assessment: Reviewed RN Assessment  Last BM:  2/18 - type 7  Height:  Ht Readings from Last 1  Encounters:  08/24/23 5' 5.98" (1.676 m)    Weight:  Wt Readings from Last 1 Encounters:  09/01/23 88.7 kg    Ideal Body Weight:  59.1 kg  BMI:  Body mass index is 31.58 kg/m.  Estimated Nutritional Needs:  Kcal:  1900-2100 kcal/d Protein:  110-125 g/d Fluid:  2L/d    Greig Castilla, RD, LDN Registered Dietitian II Please reach out via secure chat Weekend on-call pager # available in [facility]

## 2023-09-02 ENCOUNTER — Inpatient Hospital Stay (HOSPITAL_COMMUNITY): Payer: 59

## 2023-09-02 LAB — BASIC METABOLIC PANEL
Anion gap: 9 (ref 5–15)
BUN: 20 mg/dL (ref 8–23)
CO2: 26 mmol/L (ref 22–32)
Calcium: 8.6 mg/dL — ABNORMAL LOW (ref 8.9–10.3)
Chloride: 100 mmol/L (ref 98–111)
Creatinine, Ser: 0.59 mg/dL (ref 0.44–1.00)
GFR, Estimated: >60 mL/min (ref >60)
Glucose, Bld: 116 mg/dL — ABNORMAL HIGH (ref 70–99)
Potassium: 3.8 mmol/L (ref 3.5–5.1)
Sodium: 135 mmol/L (ref 135–145)

## 2023-09-02 LAB — CBC
HCT: 24.6 % — ABNORMAL LOW (ref 36.0–46.0)
Hemoglobin: 7.9 g/dL — ABNORMAL LOW (ref 12.0–15.0)
MCH: 29.9 pg (ref 26.0–34.0)
MCHC: 32.1 g/dL (ref 30.0–36.0)
MCV: 93.2 fL (ref 80.0–100.0)
Platelets: 288 10*3/uL (ref 150–400)
RBC: 2.64 MIL/uL — ABNORMAL LOW (ref 3.87–5.11)
RDW: 14.3 % (ref 11.5–15.5)
WBC: 11.2 10*3/uL — ABNORMAL HIGH (ref 4.0–10.5)
nRBC: 0 % (ref 0.0–0.2)

## 2023-09-02 LAB — CULTURE, RESPIRATORY W GRAM STAIN

## 2023-09-02 LAB — GLUCOSE, CAPILLARY
Glucose-Capillary: 123 mg/dL — ABNORMAL HIGH (ref 70–99)
Glucose-Capillary: 128 mg/dL — ABNORMAL HIGH (ref 70–99)
Glucose-Capillary: 140 mg/dL — ABNORMAL HIGH (ref 70–99)
Glucose-Capillary: 121 mg/dL — ABNORMAL HIGH (ref 70–99)
Glucose-Capillary: 120 mg/dL — ABNORMAL HIGH (ref 70–99)
Glucose-Capillary: 119 mg/dL — ABNORMAL HIGH (ref 70–99)

## 2023-09-02 LAB — MAGNESIUM: Magnesium: 2.2 mg/dL (ref 1.7–2.4)

## 2023-09-02 LAB — PATHOLOGIST SMEAR REVIEW

## 2023-09-02 MED ORDER — POTASSIUM CHLORIDE 20 MEQ PO PACK
40.0000 meq | PACK | Freq: Three times a day (TID) | ORAL | Status: DC
  Administered 2023-09-02 – 2023-09-06 (×13): 40 meq
  Filled 2023-09-02 (×13): qty 2

## 2023-09-02 MED ORDER — HYDROMORPHONE HCL 2 MG PO TABS
4.0000 mg | ORAL_TABLET | ORAL | Status: DC
  Administered 2023-09-02 – 2023-09-03 (×4): 4 mg
  Filled 2023-09-02 (×4): qty 2

## 2023-09-02 MED ORDER — POTASSIUM CHLORIDE 10 MEQ/50ML IV SOLN
10.0000 meq | Freq: Once | INTRAVENOUS | Status: AC
  Administered 2023-09-02: 10 meq via INTRAVENOUS
  Filled 2023-09-02: qty 50

## 2023-09-02 MED ORDER — FUROSEMIDE 10 MG/ML IJ SOLN
60.0000 mg | Freq: Three times a day (TID) | INTRAMUSCULAR | Status: AC
  Administered 2023-09-02 – 2023-09-03 (×3): 60 mg via INTRAVENOUS
  Filled 2023-09-02 (×3): qty 6

## 2023-09-02 MED ORDER — POTASSIUM CHLORIDE 10 MEQ/50ML IV SOLN
10.0000 meq | INTRAVENOUS | Status: AC
  Administered 2023-09-02: 10 meq via INTRAVENOUS
  Filled 2023-09-02: qty 50

## 2023-09-02 MED ORDER — SODIUM CHLORIDE 0.9 % IV SOLN
2.0000 g | Freq: Three times a day (TID) | INTRAVENOUS | Status: DC
  Administered 2023-09-02 – 2023-09-06 (×12): 2 g via INTRAVENOUS
  Filled 2023-09-02 (×13): qty 2

## 2023-09-02 NOTE — TOC Progression Note (Signed)
 Transition of Care [facility]) - Progression Note    Patient Details  Name: Kaylee [name] MRN: [id number] Date of Birth: 06/13/1961  [facility] CM/SW Contact  Marliss Coots, LCSW Phone Number: 09/02/2023, 10:47 AM  Clinical Narrative:     10:47 AM Per chart review, patient remains intubated and on tube feeds. Goals of care meetings has yet to occur as patient's daughter was unable to come onsite yesterday.    Barriers to Discharge: Continued Medical Work up  Expected Discharge Plan and Services In-house Referral: Clinical Social Work     Living arrangements for the past 2 months: Single Family Home                                       Social Determinants of Health (SDOH) Interventions SDOH Screenings   Food Insecurity: Patient Unable To Answer (08/27/2023)  Housing: Patient Unable To Answer (08/27/2023)  Transportation Needs: Patient Unable To Answer (08/27/2023)  Recent Concern: Transportation Needs - Unmet Transportation Needs (08/18/2023)  Utilities: Patient Unable To Answer (08/27/2023)  Alcohol Screen: Low Risk  (11/28/2021)  Depression (PHQ2-9): Low Risk  (03/16/2023)  Financial Resource Strain: High Risk (11/11/2021)  Social Connections: Moderately Isolated (08/18/2023)  Stress: Stress Concern Present (11/11/2021)  Tobacco Use: High Risk (08/29/2023)    Readmission Risk Interventions     No data to display

## 2023-09-02 NOTE — IPAL (Signed)
  Interdisciplinary Goals of Care Family Meeting   Date carried out: 09/02/2023  Location of the meeting: Phone conference  Member's involved: Physician, Family Member or next of kin, and Other: Resident  Durable Power of Attorney or acting medical decision maker: Kaylee [name], daughter.     Discussion: We discussed goals of care for Kaylee [name] .Daughter states that there has been family conversations taking place at home with regards to patient's goals for life. She understands patient's current conditions and agrees with comfort care. States that she would like family to come by over the next couple weeks and say their good byes; then have patient go comfort care. We do not have exact day yet, but she will inform about when to go CC. Otherwise for now, we will continue current care.   Code status:   Code Status: Do not attempt resuscitation (DNR) PRE-ARREST INTERVENTIONS DESIRED   Disposition: Continue current acute care  Time spent for the meeting: 20 minutes     Kimberley Dastrup, DO  09/02/2023, 11:38 AM
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
## 2023-09-03 LAB — POCT I-STAT 7, (LYTES, BLD GAS, ICA,H+H)
Acid-Base Excess: 2 mmol/L (ref 0.0–2.0)
Bicarbonate: 26.7 mmol/L (ref 20.0–28.0)
Calcium, Ion: 1.24 mmol/L (ref 1.15–1.40)
HCT: 24 % — ABNORMAL LOW (ref 36.0–46.0)
Hemoglobin: 8.2 g/dL — ABNORMAL LOW (ref 12.0–15.0)
O2 Saturation: 94 %
Potassium: 3.3 mmol/L — ABNORMAL LOW (ref 3.5–5.1)
Sodium: 139 mmol/L (ref 135–145)
TCO2: 28 mmol/L (ref 22–32)
pCO2 arterial: 43.9 mm[Hg] (ref 32–48)
pH, Arterial: 7.391 (ref 7.35–7.45)
pO2, Arterial: 72 mm[Hg] — ABNORMAL LOW (ref 83–108)

## 2023-09-03 LAB — HEPATIC FUNCTION PANEL
ALT: 22 U/L (ref 0–44)
AST: 17 U/L (ref 15–41)
Albumin: 2.2 g/dL — ABNORMAL LOW (ref 3.5–5.0)
Alkaline Phosphatase: 40 U/L (ref 38–126)
Bilirubin, Direct: <0.1 mg/dL (ref 0.0–0.2)
Total Bilirubin: 0.3 mg/dL (ref 0.0–1.2)
Total Protein: 6.8 g/dL (ref 6.5–8.1)

## 2023-09-03 LAB — LACTATE DEHYDROGENASE: LDH: 115 U/L (ref 98–192)

## 2023-09-03 LAB — CBC
HCT: 24.5 % — ABNORMAL LOW (ref 36.0–46.0)
Hemoglobin: 7.8 g/dL — ABNORMAL LOW (ref 12.0–15.0)
MCH: 29.7 pg (ref 26.0–34.0)
MCHC: 31.8 g/dL (ref 30.0–36.0)
MCV: 93.2 fL (ref 80.0–100.0)
Platelets: 333 10*3/uL (ref 150–400)
RBC: 2.63 MIL/uL — ABNORMAL LOW (ref 3.87–5.11)
RDW: 14.1 % (ref 11.5–15.5)
WBC: 9.8 10*3/uL (ref 4.0–10.5)
nRBC: 0 % (ref 0.0–0.2)

## 2023-09-03 LAB — BASIC METABOLIC PANEL
Anion gap: 11 (ref 5–15)
BUN: 28 mg/dL — ABNORMAL HIGH (ref 8–23)
CO2: 24 mmol/L (ref 22–32)
Calcium: 8.7 mg/dL — ABNORMAL LOW (ref 8.9–10.3)
Chloride: 103 mmol/L (ref 98–111)
Creatinine, Ser: 0.75 mg/dL (ref 0.44–1.00)
GFR, Estimated: >60 mL/min (ref >60)
Glucose, Bld: 125 mg/dL — ABNORMAL HIGH (ref 70–99)
Potassium: 4 mmol/L (ref 3.5–5.1)
Sodium: 138 mmol/L (ref 135–145)

## 2023-09-03 LAB — MAGNESIUM: Magnesium: 2 mg/dL (ref 1.7–2.4)

## 2023-09-03 LAB — GLUCOSE, CAPILLARY
Glucose-Capillary: 107 mg/dL — ABNORMAL HIGH (ref 70–99)
Glucose-Capillary: 111 mg/dL — ABNORMAL HIGH (ref 70–99)
Glucose-Capillary: 117 mg/dL — ABNORMAL HIGH (ref 70–99)
Glucose-Capillary: 125 mg/dL — ABNORMAL HIGH (ref 70–99)
Glucose-Capillary: 133 mg/dL — ABNORMAL HIGH (ref 70–99)
Glucose-Capillary: 153 mg/dL — ABNORMAL HIGH (ref 70–99)
Glucose-Capillary: 154 mg/dL — ABNORMAL HIGH (ref 70–99)

## 2023-09-03 LAB — TRIGLYCERIDES, BODY FLUIDS: Triglycerides, Fluid: 41 mg/dL

## 2023-09-03 LAB — TRIGLYCERIDES: Triglycerides: 133 mg/dL (ref <150)

## 2023-09-03 MED ORDER — GERHARDT'S BUTT CREAM
TOPICAL_CREAM | Freq: Two times a day (BID) | CUTANEOUS | Status: DC
  Filled 2023-09-03: qty 60

## 2023-09-03 MED ORDER — HYDROMORPHONE HCL 2 MG PO TABS: 8.0000 mg | ORAL_TABLET | ORAL | Status: DC

## 2023-09-03 MED ORDER — FUROSEMIDE 10 MG/ML IJ SOLN
60.0000 mg | Freq: Three times a day (TID) | INTRAMUSCULAR | Status: AC
  Administered 2023-09-03 (×3): 60 mg via INTRAVENOUS
  Filled 2023-09-03 (×3): qty 6

## 2023-09-03 MED ORDER — HYDROMORPHONE HCL 2 MG PO TABS
8.0000 mg | ORAL_TABLET | ORAL | Status: DC
  Administered 2023-09-03 – 2023-09-06 (×19): 8 mg
  Filled 2023-09-03 (×20): qty 4

## 2023-09-03 NOTE — Plan of Care (Signed)
  Problem: Clinical Measurements: Goal: Ability to maintain clinical measurements within normal limits will improve Outcome: Progressing   Problem: Activity: Goal: Risk for activity intolerance will decrease Outcome: Progressing   Problem: Nutrition: Goal: Adequate nutrition will be maintained Outcome: Progressing   Problem: Safety: Goal: Ability to remain free from injury will improve Outcome: Progressing   
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
## 2023-09-04 LAB — CBC
HCT: 24.7 % — ABNORMAL LOW (ref 36.0–46.0)
Hemoglobin: 7.7 g/dL — ABNORMAL LOW (ref 12.0–15.0)
MCH: 29.7 pg (ref 26.0–34.0)
MCHC: 31.2 g/dL (ref 30.0–36.0)
MCV: 95.4 fL (ref 80.0–100.0)
Platelets: 311 10*3/uL (ref 150–400)
RBC: 2.59 MIL/uL — ABNORMAL LOW (ref 3.87–5.11)
RDW: 14.2 % (ref 11.5–15.5)
WBC: 9.5 10*3/uL (ref 4.0–10.5)
nRBC: 0 % (ref 0.0–0.2)

## 2023-09-04 LAB — BASIC METABOLIC PANEL
Anion gap: 13 (ref 5–15)
BUN: 33 mg/dL — ABNORMAL HIGH (ref 8–23)
CO2: 27 mmol/L (ref 22–32)
Calcium: 9.4 mg/dL (ref 8.9–10.3)
Chloride: 102 mmol/L (ref 98–111)
Creatinine, Ser: 0.76 mg/dL (ref 0.44–1.00)
GFR, Estimated: >60 mL/min (ref >60)
Glucose, Bld: 123 mg/dL — ABNORMAL HIGH (ref 70–99)
Potassium: 4.1 mmol/L (ref 3.5–5.1)
Sodium: 142 mmol/L (ref 135–145)

## 2023-09-04 LAB — GLUCOSE, CAPILLARY
Glucose-Capillary: 91 mg/dL (ref 70–99)
Glucose-Capillary: 127 mg/dL — ABNORMAL HIGH (ref 70–99)
Glucose-Capillary: 130 mg/dL — ABNORMAL HIGH (ref 70–99)
Glucose-Capillary: 111 mg/dL — ABNORMAL HIGH (ref 70–99)
Glucose-Capillary: 117 mg/dL — ABNORMAL HIGH (ref 70–99)
Glucose-Capillary: 163 mg/dL — ABNORMAL HIGH (ref 70–99)

## 2023-09-04 LAB — MAGNESIUM: Magnesium: 2.1 mg/dL (ref 1.7–2.4)

## 2023-09-04 MED ORDER — FUROSEMIDE 10 MG/ML IJ SOLN
60.0000 mg | Freq: Three times a day (TID) | INTRAMUSCULAR | Status: DC
  Administered 2023-09-04 – 2023-09-06 (×6): 60 mg via INTRAVENOUS
  Filled 2023-09-04 (×7): qty 6

## 2023-09-04 MED ORDER — MIDAZOLAM BOLUS VIA INFUSION: 0.0000 mg | INTRAVENOUS | Status: DC | PRN

## 2023-09-04 MED ORDER — MIDAZOLAM BOLUS VIA INFUSION
0.0000 mg | INTRAVENOUS | Status: DC | PRN
  Administered 2023-09-05: 5 mg via INTRAVENOUS

## 2023-09-04 MED ORDER — HYDROMORPHONE HCL-NACL 50-0.9 MG/50ML-% IV SOLN
0.5000 mg/h | INTRAVENOUS | Status: DC
  Administered 2023-09-04: 2 mg/h via INTRAVENOUS
  Administered 2023-09-05 – 2023-09-06 (×2): 2.5 mg/h via INTRAVENOUS
  Filled 2023-09-04 (×3): qty 50

## 2023-09-04 MED ORDER — MIDAZOLAM-SODIUM CHLORIDE 100-0.9 MG/100ML-% IV SOLN
0.0000 mg/h | INTRAVENOUS | Status: DC
Start: 2023-09-04 — End: 2023-09-06
  Administered 2023-09-04: 2 mg/h via INTRAVENOUS
  Administered 2023-09-04 – 2023-09-05 (×2): 6 mg/h via INTRAVENOUS
  Administered 2023-09-06: 2 mg/h via INTRAVENOUS
  Filled 2023-09-04 (×4): qty 100

## 2023-09-04 NOTE — Progress Notes (Signed)
 NAMEDELITHA ELMS, MRN:  [id number], DOB:  22-Oct-1960, LOS: 11 ADMISSION DATE:  08/24/2023, CONSULTATION DATE:  08/24/2023 REFERRING MD:   Kevan Ny , CHIEF COMPLAINT:  SOB   History of Present Illness:  2F smoker with history of COPD, anxiety, and bipolar disorder who was admitted to [facility] 2/3 for flu like symptoms with positive influenza A test complicated by MRSA pneumonia and empyema s/p chest tube 2/8 and pleural lytic therapy 2/8, 2/9, 2/11 with evidence of hydropneumthorax on CT imaging 2/9.    Patient transferred to [facility] 2/11 for further evaluation by CT surgery for possible VATs decortication. She is being treated with vancomycin and the addition of clindamycin.   Right chest tube is in place. No air leak. Minimal drainage   CT Surgery evaluated, no plan for VATs at this time. Repeat CT Chest shows improved right pneumothorax and minimal right effusion. There is small left effusion which is new. Dense bilateral lower lobe consolidations with areas of cavitation.  Pertinent  Medical History  bipolar disorder, ekborn's delusional parasitosis, general anxiety disorder, COPD, tobacco abuse, and chronic pain   Significant Hospital Events: Including procedures, antibiotic start and stop dates in addition to other pertinent events   02/2: Pt presented with influenza A and chronic ekbom's delusion parasitosis.  Psychiatry recommended inpatient psychiatric hospitalization, however due to influenza A diagnosis pt remained in the ER awaiting bed availability at another psychiatric facility 02/4: Pt required hospital admission to the telemetry unit per hospitalist team due to development of acute hypoxic respiratory failure secondary to influenza A with superimposed bilateral pneumonia/AECOPD and possible benzo withdrawal  02/5: Pt developed atrial fibrillation with rvr cardiology consulted recommended scheduled metoprolol and if pt remained in rvr could start cardizem gtt.   02/6:  Due to worsening acute hypoxic respiratory failure, agitation, and pt refusing Bipap pt transferred to the stepdown unit.  PCCM assumed care and precedex gtt initiated and pt placed on Bipap 2/7: compliant with ventilator, oxygen requirements improved. 02/8: oxygenation improved, bump in white count. Sedated and disoriented, does not follow commands. Right sided chest tube placed due to right para-mnemonic effusion.  TPA and dornase injected into pleural space utilizing existing pleural catheter  02/9: Pt remains mechanically intubated attempted WUA, however pt became dyssynchronous with desaturation requiring increased O2 requirements requiring vecuronium.  Right-sided chest tube remains in place pleural TPA/dornase given  02/10: Pt remains mechanically intubated vent settings: PEEP 10/FiO2 60%.   Will perform WUA and wean PEEP/FiO2 as tolerated.  Chest tube output overnight 50 ml.  Dornase injected into pleural space in pleural catheter 02/11: Pt remains mechanically intubated current settings PEEP 10/FiO2 increased from 50% to 60%.  No chest tube output overnight will administer TPA/dornase today.  Clindamycin added today   Significant Events from (2/2 to 2/11) recorded at [facility]    02/12: Remained intubated. Started on Klonopin 0.5 mg BID. Stop Clindamycin, ID consulted. Stopped vancomycin. Started on IV Linezolid 600 mg.  02/13: CT Chest: decrease in size of R pneumothorax. Small bilateral pleural effusions L>R. Consolidative changes.  02/14: Stop IV heparin. Start SubQ Lovenox BID  02/17: Removed R sided Chest tube, later in the afternoon, Emergent Bronch showing large mucous along ETT, re-intubated and BAL RLL.  02/18: Hgb 6.7, Ordered 1 PRBC. Stop Fent, start Dilaudid drip. Increase klonopin 2 mg TID. Oxy 10 mg q6H. Stopped Lovenox 02/19: Restarted Lovenox. Stop Lasix. BAL growing rare pseudomonas, Start Cefepime. Left chest tube placed.  02/20: Spoke with  daughter over phone with plans for  comfort care in the couple of weeks. Day TBD 2/21 chest tube out; switching around sedation  Interim History / Subjective:  No events. Still need high sedation  Objective   Blood pressure (!) 106/53, pulse 85, temperature 100 F (37.8 C), resp. rate (!) 26, height 5' 5.98" (1.676 m), weight 87.8 kg, SpO2 93%.    Vent Mode: PRVC FiO2 (%):  [50 %-60 %] 60 % Set Rate:  [25 bmp] 25 bmp Vt Set:  [470 mL] 470 mL PEEP:  [8 cmH20] 8 cmH20 Plateau Pressure:  [25 cmH20-26 cmH20] 26 cmH20   Intake/Output Summary (Last 24 hours) at 09/04/2023 0855 Last data filed at 09/04/2023 0825 Gross per 24 hour  Intake 3227.28 ml  Output 3475 ml  Net -247.72 ml   Filed Weights   09/02/23 0221 09/03/23 0429 09/04/23 0432  Weight: 87.8 kg 87.8 kg 87.8 kg    Examination: Acute on chronically ill ++anasarca Lungs harsh rhonci Overbreathing and fighting vent intermittently Heavy secretion burden pursists Abd soft  Patient Lines/Drains/Airways Status     Active Line/Drains/Airways     Name Placement date Placement time Site Days   CVC Triple Lumen 08/19/23 Right Internal jugular 08/19/23  1400  -- 16   External Urinary Catheter 09/03/23  1643  --  1   Fecal Management System 30 mL 08/23/23  0800  -- 12   Airway 8.5 mm 08/30/23  1720  -- 5   Small Bore Feeding Tube 10 Fr. Left nare Marking at nare/corner of mouth 79 cm 08/30/23  1603  Left nare  5   Wound / Incision (Open or Dehisced) 09/03/23 Irritant Dermatitis (Moisture Associated Skin Damage);(IAD) Incontinence Associated Dermatitis Buttocks 09/03/23  0800  Buttocks  1              Pertinent Imaging:    2/3 CXR showed no active disease  2/4 CXR showed Confluent new right greater than left lung base opacity, New right pleural effusion not excluded  2/6 CXR: Patchy consolidation in the right greater than left mid to lower lung fields and small pleural effusions.  2/6 Abd xray: Dilated loops of small and large bowel, suggestive of  ileus.  2/8 CXR: Unchanged bibasilar airspace opacities, greater on the right, likely due to combination of pneumonia and small pleural effusions 2/9 CXR: Patchy bibasilar lung consolidation, right greater than left, slightly worsened on the right. Probable stable small bilateral pleural effusions  2/9 CT chest: Right hydropneumothorax with a small pneumo component anteriorly, and a separate hydropneumothorax which could be loculated, posterior and medial to the right lower lobe. Extensive consolidative airspace disease extending from the hila into the bilateral lower lobe basal segments and into the right middle lobe lateral segment more so than the medial segment. 2/10 CXR: Improved aeration in the right lung base with persistent opacity seen. 2/11 CXR: Similar appearance of bilateral lower lung field airspace opacities. 2/13 CT chest: Decrease in the size of right pneumothorax. Minimal residual pneumothorax noted anterior to the right lung. Small bilateral pleural effusions, left greater than right. Consolidative changes of the lungs and cavitary nodules, relatively similar to prior CT.   Resolved Hospital Problem list   N/A   Assessment & Plan:  Acute hypoxemic respiratory failure secondary to Influenza A complicated by necrotizing MRSA PNA and subsequent pseudomonas VAP; complicated by poor nutritional status, third spacing, volume overload Bipolar, meth dependence, relative protein calorie malnutrition, FTT PTA Bilateral infected pleural spaces- drained,  chest tubes out Ongoing issues with air hunger, sedation- complicated by baseline polysubstance abuse, having trouble finding happy medium Angioedema- given totality of everything, appearance of infiltrates on CT, echo valvular abnormalities almost wonder if she had IE w/ embolic strokes and associated angioedema we sometimes see with this  - Continue diuresis as tolerated by renal function - Zyvox until Monday, may extend with vanc  depending on GOC - Fortaz until 09/09/23 or 3/2 depending on clinical trajectory - Working on transitioning prop to versed gtt and dilaudid IV To PO (versed to continue on palliative extubation, dilaudid because we have a shortage of dilaudid iV) - Overall plan is continuing aggressive care but DNR, awaiting family arrival and discussing timing of palliative withdrawal as they do not think she would want to keep pushing forward in light of baseline QoL, severe necrotizing PNA, and likely need for vent-SNF for forseeable future.  Best Practice (right click and "Reselect all SmartList Selections" daily)   Diet/type: tubefeeds DVT prophylaxis Lovenox  Pressure ulcer(s): Assessed by RN GI prophylaxis: PPI Lines: CVC RIJ, Art line  Foley:  Yes, and it is still needed Code Status:  DNR Last date of multidisciplinary goals of care discussion [in person tomorrow hopefully]  35 min cc time Myrla Halsted MD PCCM

## 2023-09-04 NOTE — Plan of Care (Signed)
  Problem: Clinical Measurements: Goal: Will remain free from infection Outcome: Progressing   Problem: Activity: Goal: Risk for activity intolerance will decrease Outcome: Progressing   Problem: Nutrition: Goal: Adequate nutrition will be maintained Outcome: Progressing   Problem: Elimination: Goal: Will not experience complications related to urinary retention Outcome: Progressing   Problem: Safety: Goal: Ability to remain free from injury will improve Outcome: Progressing

## 2023-09-05 LAB — GLUCOSE, CAPILLARY
Glucose-Capillary: 117 mg/dL — ABNORMAL HIGH (ref 70–99)
Glucose-Capillary: 120 mg/dL — ABNORMAL HIGH (ref 70–99)
Glucose-Capillary: 125 mg/dL — ABNORMAL HIGH (ref 70–99)
Glucose-Capillary: 135 mg/dL — ABNORMAL HIGH (ref 70–99)
Glucose-Capillary: 147 mg/dL — ABNORMAL HIGH (ref 70–99)
Glucose-Capillary: 99 mg/dL (ref 70–99)

## 2023-09-05 LAB — BASIC METABOLIC PANEL
Anion gap: 9 (ref 5–15)
BUN: 44 mg/dL — ABNORMAL HIGH (ref 8–23)
CO2: 26 mmol/L (ref 22–32)
Calcium: 9.1 mg/dL (ref 8.9–10.3)
Chloride: 107 mmol/L (ref 98–111)
Creatinine, Ser: 0.85 mg/dL (ref 0.44–1.00)
GFR, Estimated: >60 mL/min (ref >60)
Glucose, Bld: 107 mg/dL — ABNORMAL HIGH (ref 70–99)
Potassium: 4.3 mmol/L (ref 3.5–5.1)
Sodium: 142 mmol/L (ref 135–145)

## 2023-09-05 LAB — CBC
HCT: 23 % — ABNORMAL LOW (ref 36.0–46.0)
Hemoglobin: 7.1 g/dL — ABNORMAL LOW (ref 12.0–15.0)
MCH: 29.8 pg (ref 26.0–34.0)
MCHC: 30.9 g/dL (ref 30.0–36.0)
MCV: 96.6 fL (ref 80.0–100.0)
Platelets: 339 10*3/uL (ref 150–400)
RBC: 2.38 MIL/uL — ABNORMAL LOW (ref 3.87–5.11)
RDW: 14.2 % (ref 11.5–15.5)
WBC: 9.1 10*3/uL (ref 4.0–10.5)
nRBC: 0 % (ref 0.0–0.2)

## 2023-09-05 LAB — PHOSPHORUS: Phosphorus: 4.1 mg/dL (ref 2.5–4.6)

## 2023-09-05 LAB — BODY FLUID CULTURE W GRAM STAIN: Culture: NO GROWTH

## 2023-09-05 LAB — MAGNESIUM: Magnesium: 2.2 mg/dL (ref 1.7–2.4)

## 2023-09-05 MED ORDER — WHITE PETROLATUM EX OINT
TOPICAL_OINTMENT | CUTANEOUS | Status: DC | PRN
  Filled 2023-09-05 (×2): qty 28.35

## 2023-09-05 MED ORDER — MIDAZOLAM BOLUS VIA INFUSION: 0.0000 mg | INTRAVENOUS | Status: AC | PRN

## 2023-09-05 NOTE — Progress Notes (Signed)
 Kaylee [name], MRN:  [id number], DOB:  [birth year], LOS: 12 ADMISSION DATE:  08/24/2023, CONSULTATION DATE:  08/24/2023 REFERRING MD:   Kevan Ny , CHIEF COMPLAINT:  SOB   History of Present Illness:  47F smoker with history of COPD, anxiety, and bipolar disorder who was admitted to [facility] 2/3 for flu like symptoms with positive influenza A test complicated by MRSA pneumonia and empyema s/p chest tube 2/8 and pleural lytic therapy 2/8, 2/9, 2/11 with evidence of hydropneumthorax on CT imaging 2/9.    Patient transferred to [facility] 2/11 for further evaluation by CT surgery for possible VATs decortication. She is being treated with vancomycin and the addition of clindamycin.   Right chest tube is in place. No air leak. Minimal drainage   CT Surgery evaluated, no plan for VATs at this time. Repeat CT Chest shows improved right pneumothorax and minimal right effusion. There is small left effusion which is new. Dense bilateral lower lobe consolidations with areas of cavitation.  Pertinent  Medical History  bipolar disorder, ekborn's delusional parasitosis, general anxiety disorder, COPD, tobacco abuse, and chronic pain   Significant Hospital Events: Including procedures, antibiotic start and stop dates in addition to other pertinent events   02/2: Pt presented with influenza A and chronic ekbom's delusion parasitosis.  Psychiatry recommended inpatient psychiatric hospitalization, however due to influenza A diagnosis pt remained in the ER awaiting bed availability at another psychiatric facility 02/4: Pt required hospital admission to the telemetry unit per hospitalist team due to development of acute hypoxic respiratory failure secondary to influenza A with superimposed bilateral pneumonia/AECOPD and possible benzo withdrawal  02/5: Pt developed atrial fibrillation with rvr cardiology consulted recommended scheduled metoprolol and if pt remained in rvr could start cardizem gtt.   02/6:  Due to worsening acute hypoxic respiratory failure, agitation, and pt refusing Bipap pt transferred to the stepdown unit.  PCCM assumed care and precedex gtt initiated and pt placed on Bipap 2/7: compliant with ventilator, oxygen requirements improved. 02/8: oxygenation improved, bump in white count. Sedated and disoriented, does not follow commands. Right sided chest tube placed due to right para-mnemonic effusion.  TPA and dornase injected into pleural space utilizing existing pleural catheter  02/9: Pt remains mechanically intubated attempted WUA, however pt became dyssynchronous with desaturation requiring increased O2 requirements requiring vecuronium.  Right-sided chest tube remains in place pleural TPA/dornase given  02/10: Pt remains mechanically intubated vent settings: PEEP 10/FiO2 60%.   Will perform WUA and wean PEEP/FiO2 as tolerated.  Chest tube output overnight 50 ml.  Dornase injected into pleural space in pleural catheter 02/11: Pt remains mechanically intubated current settings PEEP 10/FiO2 increased from 50% to 60%.  No chest tube output overnight will administer TPA/dornase today.  Clindamycin added today   Significant Events from (2/2 to 2/11) recorded at [facility]    02/12: Remained intubated. Started on Klonopin 0.5 mg BID. Stop Clindamycin, ID consulted. Stopped vancomycin. Started on IV Linezolid 600 mg.  02/13: CT Chest: decrease in size of R pneumothorax. Small bilateral pleural effusions L>R. Consolidative changes.  02/14: Stop IV heparin. Start SubQ Lovenox BID  02/17: Removed R sided Chest tube, later in the afternoon, Emergent Bronch showing large mucous along ETT, re-intubated and BAL RLL.  02/18: Hgb 6.7, Ordered 1 PRBC. Stop Fent, start Dilaudid drip. Increase klonopin 2 mg TID. Oxy 10 mg q6H. Stopped Lovenox 02/19: Restarted Lovenox. Stop Lasix. BAL growing rare pseudomonas, Start Cefepime. Left chest tube placed.  02/20: Spoke with  daughter over phone with plans for  comfort care in the couple of weeks. Day TBD 2/21 chest tube out; switching around sedation  Interim History / Subjective:  No events. Still need high sedation Angioedema getting worse   cefTAZidime (FORTAZ)  IV Stopped (09/05/23 0625)   feeding supplement (VITAL AF 1.2 CAL) 45 mL/hr at 09/05/23 0700   HYDROmorphone 2.5 mg/hr (09/05/23 0700)   linezolid (ZYVOX) IV Stopped (09/04/23 2314)   midazolam 6 mg/hr (09/05/23 0700)   norepinephrine (LEVOPHED) Adult infusion Stopped (09/03/23 2145)   propofol (DIPRIVAN) infusion Stopped (09/04/23 1839)     Objective   Blood pressure (!) 98/44, pulse 88, temperature 100.2 F (37.9 C), resp. rate (!) 25, height 5' 5.98" (1.676 m), weight 87.8 kg, SpO2 92%.    Vent Mode: PRVC FiO2 (%):  [44 %-60 %] 55 % Set Rate:  [20 bmp-25 bmp] 20 bmp Vt Set:  [470 mL] 470 mL PEEP:  [8 cmH20] 8 cmH20 Plateau Pressure:  [25 cmH20-26 cmH20] 26 cmH20   Intake/Output Summary (Last 24 hours) at 09/05/2023 0740 Last data filed at 09/05/2023 0700 Gross per 24 hour  Intake 2508.73 ml  Output 2100 ml  Net 408.73 ml   Filed Weights   09/03/23 0429 09/04/23 0432 09/05/23 0500  Weight: 87.8 kg 87.8 kg 87.8 kg    Examination: Sedated, tachypneic Not following commands Starting to have pressure ulcers on face from angioedema plus ETT plus NGT Abd soft Global anasarca Rhonci stable  Pertinent Imaging:    2/3 CXR showed no active disease  2/4 CXR showed Confluent new right greater than left lung base opacity, New right pleural effusion not excluded  2/6 CXR: Patchy consolidation in the right greater than left mid to lower lung fields and small pleural effusions.  2/6 Abd xray: Dilated loops of small and large bowel, suggestive of ileus.  2/8 CXR: Unchanged bibasilar airspace opacities, greater on the right, likely due to combination of pneumonia and small pleural effusions 2/9 CXR: Patchy bibasilar lung consolidation, right greater than left, slightly  worsened on the right. Probable stable small bilateral pleural effusions  2/9 CT chest: Right hydropneumothorax with a small pneumo component anteriorly, and a separate hydropneumothorax which could be loculated, posterior and medial to the right lower lobe. Extensive consolidative airspace disease extending from the hila into the bilateral lower lobe basal segments and into the right middle lobe lateral segment more so than the medial segment. 2/10 CXR: Improved aeration in the right lung base with persistent opacity seen. 2/11 CXR: Similar appearance of bilateral lower lung field airspace opacities. 2/13 CT chest: Decrease in the size of right pneumothorax. Minimal residual pneumothorax noted anterior to the right lung. Small bilateral pleural effusions, left greater than right. Consolidative changes of the lungs and cavitary nodules, relatively similar to prior CT.   Resolved Hospital Problem list   N/A   Assessment & Plan:  Acute hypoxemic respiratory failure secondary to Influenza A complicated by necrotizing MRSA PNA and subsequent pseudomonas VAP; complicated by poor nutritional status, third spacing, volume overload Bipolar, meth dependence, relative protein calorie malnutrition, FTT PTA Bilateral infected pleural spaces- drained, chest tubes out Ongoing issues with air hunger, sedation- complicated by baseline polysubstance abuse, having trouble finding happy medium Angioedema- given totality of everything, appearance of infiltrates on CT, echo valvular abnormalities almost wonder if she had IE w/ embolic strokes and associated angioedema we sometimes see with this  - Continue diuresis as tolerated by renal function - Zyvox until Monday,  may extend with vanc depending on GOC - Fortaz until 09/09/23 or 3/2 depending on clinical trajectory - Working on transitioning prop to versed gtt and dilaudid IV To PO (versed to continue on palliative extubation, dilaudid because we have a shortage of  dilaudid iV, not sure how much more per tube dilaudid we can use that this point, will discuss with pharmD)  Overall looking clinically worse and suffering more each day due to worsening skin breakdown and angioedema along with continued air hunger despite high sedation, I have called daughter, left VM, and we need to move up timeline on transition to comfort per previous family discussions.  Alternatively if they have changed their minds and want continued aggressive care we need to get CNS imaging and consider trach soon.  Best Practice (right click and "Reselect all SmartList Selections" daily)   Diet/type: tubefeeds DVT prophylaxis Lovenox  Pressure ulcer(s): Assessed by RN GI prophylaxis: PPI Lines: CVC RIJ, Art line  Foley:  Yes, and it is still needed Code Status:  DNR Last date of multidisciplinary goals of care discussion [in person today hopefully]  32 min cc time Myrla Halsted MD PCCM

## 2023-09-05 NOTE — Plan of Care (Signed)
  Problem: Clinical Measurements: Goal: Will remain free from infection Outcome: Progressing   Problem: Nutrition: Goal: Adequate nutrition will be maintained Outcome: Progressing   Problem: Pain Managment: Goal: General experience of comfort will improve and/or be controlled Outcome: Progressing   Problem: Safety: Goal: Ability to remain free from injury will improve Outcome: Progressing   Problem: Activity: Goal: Ability to tolerate increased activity will improve Outcome: Progressing

## 2023-09-05 NOTE — Plan of Care (Signed)
  Problem: Health Behavior/Discharge Planning: Goal: Ability to manage health-related needs will improve Outcome: Progressing   Problem: Clinical Measurements: Goal: Will remain free from infection Outcome: Progressing Goal: Respiratory complications will improve Outcome: Progressing   Problem: Activity: Goal: Risk for activity intolerance will decrease Outcome: Progressing   Problem: Elimination: Goal: Will not experience complications related to bowel motility Outcome: Progressing   Problem: Pain Managment: Goal: General experience of comfort will improve and/or be controlled Outcome: Progressing   Problem: Safety: Goal: Ability to remain free from injury will improve Outcome: Progressing   Problem: Skin Integrity: Goal: Risk for impaired skin integrity will decrease Outcome: Progressing

## 2023-09-05 NOTE — IPAL (Signed)
  Interdisciplinary Goals of Care Family Meeting   Date carried out: 09/05/2023  Location of the meeting: Bedside  Member's involved: Physician, Bedside Registered Nurse, and Family Member or next of kin  Durable Power of Attorney or acting medical decision maker: daughter    Discussion: We discussed goals of care for [employer] .    Discussed her wishes, current clinical status and suffering. All in agreement to transition to comfort tomorrow at [facility] after family has chance to say goodbye.  Code status:   Code Status: Do not attempt resuscitation (DNR) PRE-ARREST INTERVENTIONS DESIRED   Disposition:  DNR Transition to comfort tomorrow  Time spent for the meeting: 15 mins    Lorin Glass, MD  09/05/2023, 11:34 AM

## 2023-09-06 DIAGNOSIS — B965 Pseudomonas (aeruginosa) (mallei) (pseudomallei) as the cause of diseases classified elsewhere: Secondary | ICD-10-CM

## 2023-09-06 LAB — BASIC METABOLIC PANEL
Anion gap: 5 (ref 5–15)
BUN: 46 mg/dL — ABNORMAL HIGH (ref 8–23)
CO2: 27 mmol/L (ref 22–32)
Calcium: 9.1 mg/dL (ref 8.9–10.3)
Chloride: 111 mmol/L (ref 98–111)
Creatinine, Ser: 0.82 mg/dL (ref 0.44–1.00)
GFR, Estimated: >60 mL/min (ref >60)
Glucose, Bld: 129 mg/dL — ABNORMAL HIGH (ref 70–99)
Potassium: 4.5 mmol/L (ref 3.5–5.1)
Sodium: 143 mmol/L (ref 135–145)

## 2023-09-06 LAB — CBC
HCT: 22.7 % — ABNORMAL LOW (ref 36.0–46.0)
Hemoglobin: 6.9 g/dL — CL (ref 12.0–15.0)
MCH: 29.5 pg (ref 26.0–34.0)
MCHC: 30.4 g/dL (ref 30.0–36.0)
MCV: 97 fL (ref 80.0–100.0)
Platelets: 365 10*3/uL (ref 150–400)
RBC: 2.34 MIL/uL — ABNORMAL LOW (ref 3.87–5.11)
RDW: 14.3 % (ref 11.5–15.5)
WBC: 8.4 10*3/uL (ref 4.0–10.5)
nRBC: 0.2 % (ref 0.0–0.2)

## 2023-09-06 LAB — TRIGLYCERIDES: Triglycerides: 74 mg/dL (ref <150)

## 2023-09-06 LAB — GLUCOSE, CAPILLARY
Glucose-Capillary: 116 mg/dL — ABNORMAL HIGH (ref 70–99)
Glucose-Capillary: 155 mg/dL — ABNORMAL HIGH (ref 70–99)
Glucose-Capillary: 147 mg/dL — ABNORMAL HIGH (ref 70–99)
Glucose-Capillary: 135 mg/dL — ABNORMAL HIGH (ref 70–99)

## 2023-09-06 LAB — MAGNESIUM: Magnesium: 2.3 mg/dL (ref 1.7–2.4)

## 2023-09-06 LAB — PHOSPHORUS: Phosphorus: 3.2 mg/dL (ref 2.5–4.6)

## 2023-09-06 MED ORDER — AMIODARONE HCL 200 MG PO TABS: 200.0000 mg | ORAL_TABLET | Freq: Two times a day (BID) | ORAL | Status: DC

## 2023-09-06 MED ORDER — GLYCOPYRROLATE 0.2 MG/ML IJ SOLN: 0.2000 mg | INTRAMUSCULAR | Status: DC | PRN

## 2023-09-06 MED ORDER — ACETAMINOPHEN 325 MG PO TABS: 650.0000 mg | ORAL_TABLET | Freq: Four times a day (QID) | ORAL | Status: DC | PRN

## 2023-09-06 MED ORDER — ACETAMINOPHEN 650 MG RE SUPP: 650.0000 mg | Freq: Four times a day (QID) | RECTAL | Status: DC | PRN

## 2023-09-06 MED ORDER — MIDAZOLAM-SODIUM CHLORIDE 100-0.9 MG/100ML-% IV SOLN
0.0000 mg/h | INTRAVENOUS | Status: DC
  Administered 2023-09-06: 4 mg/h via INTRAVENOUS

## 2023-09-06 MED ORDER — GLYCOPYRROLATE 1 MG PO TABS: 1.0000 mg | ORAL_TABLET | ORAL | Status: DC | PRN

## 2023-09-06 MED ORDER — POLYVINYL ALCOHOL 1.4 % OP SOLN: 1.0000 [drp] | Freq: Four times a day (QID) | OPHTHALMIC | Status: DC | PRN

## 2023-09-06 MED ORDER — PROPOFOL 1000 MG/100ML IV EMUL
5.0000 ug/kg/min | INTRAVENOUS | Status: DC
  Administered 2023-09-06: 5 ug/kg/min via INTRAVENOUS
  Filled 2023-09-06: qty 100

## 2023-09-06 MED ORDER — MIDAZOLAM HCL 2 MG/2ML IJ SOLN: 1.0000 mg | INTRAMUSCULAR | Status: DC | PRN

## 2023-09-06 MED ORDER — MIDAZOLAM BOLUS VIA INFUSION (WITHDRAWAL LIFE SUSTAINING TX)
2.0000 mg | INTRAVENOUS | Status: DC | PRN
  Administered 2023-09-06 (×10): 2 mg via INTRAVENOUS

## 2023-09-06 MED ORDER — HYDROMORPHONE BOLUS VIA INFUSION
1.0000 mg | INTRAVENOUS | Status: DC | PRN
  Administered 2023-09-06 (×11): 1 mg via INTRAVENOUS

## 2023-09-06 MED ORDER — HYDROMORPHONE HCL-NACL 50-0.9 MG/50ML-% IV SOLN
0.0000 mg/h | INTRAVENOUS | Status: DC
  Administered 2023-09-06: 3 mg/h via INTRAVENOUS
  Filled 2023-09-06 (×2): qty 50

## 2023-09-11 NOTE — TOC Progression Note (Signed)
 Transition of Care [facility]) - Progression Note    Patient Details  Name: Kaylee [name] MRN: [id number] Date of Birth: 1961/04/01  [facility] CM/SW Contact  Harriet Masson, RN Phone Number: 08/20/2023, 11:43 AM  Clinical Narrative:    Plan for patient to transition to comfort care today.      Barriers to Discharge: Continued Medical Work up  Expected Discharge Plan and Services In-house Referral: Clinical Social Work     Living arrangements for the past 2 months: Single Family Home                                       Social Determinants of Health (SDOH) Interventions SDOH Screenings   Food Insecurity: Patient Unable To Answer (08/27/2023)  Housing: Patient Unable To Answer (08/27/2023)  Transportation Needs: Patient Unable To Answer (08/27/2023)  Recent Concern: Transportation Needs - Unmet Transportation Needs (08/18/2023)  Utilities: Patient Unable To Answer (08/27/2023)  Alcohol Screen: Low Risk  (11/28/2021)  Depression (PHQ2-9): Low Risk  (03/16/2023)  Financial Resource Strain: High Risk (11/11/2021)  Social Connections: Moderately Isolated (08/18/2023)  Stress: Stress Concern Present (11/11/2021)  Tobacco Use: High Risk (08/29/2023)    Readmission Risk Interventions     No data to display

## 2023-09-11 NOTE — Progress Notes (Signed)
   08/26/2023 1900  Spiritual Encounters  Type of Visit Initial  Care provided to: Pt and family  Referral source Chaplain team  Reason for visit Routine spiritual support  OnCall Visit No   Chaplain responded to a request for prayer for the patient and family.  Family was spending quality time with their loved one sharing their memories of "Cam". We prayed for her to have a  peaceful death and they be comforted with their memories of her.   Valerie Roys [facility]  [phone]

## 2023-09-11 NOTE — Death Summary Note (Signed)
 Name: Kaylee [name] MRN: [id number] DOB: [birth year] 63 y.o.  Date of Admission: 08/24/2023  3:47 PM Date of Discharge: 08/18/2023  Attending Physician: Durel Salts, MD   Discharge Diagnosis: Principal Problem:   Septic shock (HCC) Acute hypoxemic respiratory failure 2/2 influenza A infection  MRSA necrotizing pneumonia with empyema COPD with acute exacerbation Pseudomonas colonization Hydropneumothorax Polysubstance abuse New onset Afib RVR Genital Herpes  Protein calorie Malnutrition   Cause of death: Cardiopulmonary arrest secondary to above  Time of death: 18:42   Disposition and follow-up:   Ms.Kaylee [name] was discharged from [facility] in expired condition.    Hospital Course: This is a 63 year old female with ith history of COPD, anxiety, and bipolar disorder who was admitted to [facility] 2/3 for flu like symptoms with positive influenza A test complicated by MRSA pneumonia and empyema s/p chest tube 2/8 and pleural lytic therapy 2/8, 2/9, 2/11 with evidence of hydropneumthorax on CT imaging 2/9. Patient transferred to [facility] 2/11 for further evaluation by CT surgery for possible VATs decortication. CT surgery evaluated patient 2/12, recommended no surgical intervention. She remained intubated through her hospital stay with failing SAT/SBT. R sided chest tube was removed on 2/17 and had an emergent bronch and BAL. She had moderate sized left pleural effusion, had a left sided chest tube placed on 2/19 and removed on 2/21. Throughout her stay, she has been treated with multiple of Abx, sedation, analgesia, nebulization.   Significant as noted below:   02/2: Pt presented with influenza A and chronic ekbom's delusion parasitosis.  Psychiatry recommended inpatient psychiatric hospitalization, however due to influenza A diagnosis pt remained in the ER awaiting bed availability at another psychiatric facility 02/4: Pt required hospital admission to the telemetry  unit per hospitalist team due to development of acute hypoxic respiratory failure secondary to influenza A with superimposed bilateral pneumonia/AECOPD and possible benzo withdrawal  02/5: Pt developed atrial fibrillation with rvr cardiology consulted recommended scheduled metoprolol and if pt remained in rvr could start cardizem gtt.   02/6: Due to worsening acute hypoxic respiratory failure, agitation, and pt refusing Bipap pt transferred to the stepdown unit.  PCCM assumed care and precedex gtt initiated and pt placed on Bipap 2/7: compliant with ventilator, oxygen requirements improved. 02/8: oxygenation improved, bump in white count. Sedated and disoriented, does not follow commands. Right sided chest tube placed due to right para-mnemonic effusion.  TPA and dornase injected into pleural space utilizing existing pleural catheter  02/9: Pt remains mechanically intubated attempted WUA, however pt became dyssynchronous with desaturation requiring increased O2 requirements requiring vecuronium.  Right-sided chest tube remains in place pleural TPA/dornase given  02/10: Pt remains mechanically intubated vent settings: PEEP 10/FiO2 60%.   Will perform WUA and wean PEEP/FiO2 as tolerated.  Chest tube output overnight 50 ml. Dornase injected into pleural space in pleural catheter 02/11: Pt remains mechanically intubated current settings PEEP 10/FiO2 increased from 50% to 60%.  No chest tube output overnight will administer TPA/dornase today.  Clindamycin added today    Significant Events from (2/2 to 2/11) recorded at [facility]     02/12: Remained intubated. Started on Klonopin 0.5 mg BID. Stop Clindamycin, ID consulted. Stopped vancomycin. Started on IV Linezolid 600 mg.  02/13: CT Chest: decrease in size of R pneumothorax. Small bilateral pleural effusions L>R. Consolidative changes.  02/14: Stop IV heparin. Start SubQ Lovenox BID  02/17: Removed R sided Chest tube, later in the afternoon, Emergent Bronch  showing large  mucous along ETT, re-intubated and BAL RLL.  02/18: Hgb 6.7, Ordered 1 PRBC. Stop Fent, start Dilaudid drip. Increase klonopin 2 mg TID. Oxy 10 mg q6H. Stopped Lovenox 02/19: Restarted Lovenox. Stop Lasix. BAL growing rare pseudomonas, Start Cefepime. Left chest tube placed.  02/20: Spoke with daughter over phone with plans for comfort care in the couple of weeks. Day TBD 02/21: Left Chest tube removed  02/24: Comfort Care measures today   Per family, patient was changed to DNR/DNI with comfort care. She was compassionately extubated on 08/23/2023 with family at bedside. She was kept comfortable. She passed away peacefully.    SignedJeral Pinch, DO 09/07/2023, 10:32 AM

## 2023-09-11 NOTE — Progress Notes (Signed)
 eLink Physician-Brief Progress Note Patient Name: Kaylee [name] DOB: 08-Sep-1960 MRN: [id number]   Date of Service  08/22/2023  HPI/Events of Note  Hgb 6.9    plans to extubated/comfort care today Discussed with RN.   eICU Interventions  Continue care, no transfusion needed.      Intervention Category Intermediate Interventions: Other:  Ranee Gosselin 09/08/2023, 5:58 AM

## 2023-09-11 NOTE — Progress Notes (Addendum)
 Patient extubated compassionately. Comfort Care Measures PRN. Family at bedside.
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
## 2023-09-11 NOTE — Progress Notes (Signed)
 RT NOTE: RT extubated PT to comfort care per order.

## 2023-09-11 DEATH — deceased

## 2023-09-30 ENCOUNTER — Ambulatory Visit (HOSPITAL_COMMUNITY): Payer: 59 | Admitting: Psychiatry
